# Patient Record
Sex: Male | Born: 1945
Health system: Southern US, Community
[De-identification: ages and names within clinical notes are randomized; demographics above are authoritative.]

## PROBLEM LIST (undated history)

## (undated) DIAGNOSIS — Z8701 Personal history of pneumonia (recurrent): Secondary | ICD-10-CM

## (undated) DIAGNOSIS — J349 Unspecified disorder of nose and nasal sinuses: Secondary | ICD-10-CM

## (undated) DIAGNOSIS — T4145XA Adverse effect of unspecified anesthetic, initial encounter: Secondary | ICD-10-CM

## (undated) DIAGNOSIS — E7800 Pure hypercholesterolemia, unspecified: Secondary | ICD-10-CM

## (undated) DIAGNOSIS — G4730 Sleep apnea, unspecified: Secondary | ICD-10-CM

## (undated) DIAGNOSIS — Z77098 Contact with and (suspected) exposure to other hazardous, chiefly nonmedicinal, chemicals: Secondary | ICD-10-CM

## (undated) DIAGNOSIS — K8020 Calculus of gallbladder without cholecystitis without obstruction: Secondary | ICD-10-CM

## (undated) DIAGNOSIS — I10 Essential (primary) hypertension: Secondary | ICD-10-CM

## (undated) DIAGNOSIS — Z923 Personal history of irradiation: Secondary | ICD-10-CM

## (undated) DIAGNOSIS — R519 Headache, unspecified: Secondary | ICD-10-CM

## (undated) DIAGNOSIS — C801 Malignant (primary) neoplasm, unspecified: Secondary | ICD-10-CM

## (undated) DIAGNOSIS — E039 Hypothyroidism, unspecified: Secondary | ICD-10-CM

## (undated) HISTORY — DX: Personal history of pneumonia (recurrent): Z87.01

## (undated) HISTORY — DX: Sleep apnea, unspecified: G47.30

## (undated) HISTORY — DX: Calculus of gallbladder without cholecystitis without obstruction: K80.20

## (undated) HISTORY — DX: Pure hypercholesterolemia, unspecified: E78.00

## (undated) HISTORY — DX: Unspecified disorder of nose and nasal sinuses: J34.9

---
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
## 1985-04-04 HISTORY — PX: NASAL SINUS SURGERY: SHX719

## 1998-09-05 ENCOUNTER — Emergency Department (HOSPITAL_COMMUNITY): Admission: EM | Admit: 1998-09-05 | Discharge: 1998-09-05 | Payer: Self-pay | Admitting: Emergency Medicine

## 2001-11-08 ENCOUNTER — Ambulatory Visit (HOSPITAL_COMMUNITY): Admission: RE | Admit: 2001-11-08 | Discharge: 2001-11-08 | Payer: Self-pay | Admitting: Interventional Cardiology

## 2002-05-14 ENCOUNTER — Encounter (INDEPENDENT_AMBULATORY_CARE_PROVIDER_SITE_OTHER): Payer: Self-pay | Admitting: Specialist

## 2002-05-14 ENCOUNTER — Ambulatory Visit (HOSPITAL_COMMUNITY): Admission: RE | Admit: 2002-05-14 | Discharge: 2002-05-14 | Payer: Self-pay | Admitting: Gastroenterology

## 2003-01-14 ENCOUNTER — Encounter: Admission: RE | Admit: 2003-01-14 | Discharge: 2003-01-14 | Payer: Self-pay | Admitting: Gastroenterology

## 2003-01-14 ENCOUNTER — Encounter: Payer: Self-pay | Admitting: Gastroenterology

## 2005-04-04 DIAGNOSIS — T8859XA Other complications of anesthesia, initial encounter: Secondary | ICD-10-CM

## 2005-04-04 HISTORY — DX: Other complications of anesthesia, initial encounter: T88.59XA

## 2005-12-01 ENCOUNTER — Ambulatory Visit (HOSPITAL_COMMUNITY): Admission: RE | Admit: 2005-12-01 | Discharge: 2005-12-01 | Payer: Self-pay | Admitting: Orthopedic Surgery

## 2005-12-16 ENCOUNTER — Ambulatory Visit: Payer: Self-pay | Admitting: Critical Care Medicine

## 2005-12-21 ENCOUNTER — Ambulatory Visit (HOSPITAL_COMMUNITY): Admission: RE | Admit: 2005-12-21 | Discharge: 2005-12-21 | Payer: Self-pay | Admitting: Critical Care Medicine

## 2005-12-21 ENCOUNTER — Encounter (INDEPENDENT_AMBULATORY_CARE_PROVIDER_SITE_OTHER): Payer: Self-pay | Admitting: *Deleted

## 2005-12-21 ENCOUNTER — Ambulatory Visit: Payer: Self-pay | Admitting: Critical Care Medicine

## 2005-12-21 HISTORY — PX: OTHER SURGICAL HISTORY: SHX169

## 2005-12-27 ENCOUNTER — Ambulatory Visit (HOSPITAL_COMMUNITY): Admission: RE | Admit: 2005-12-27 | Discharge: 2005-12-27 | Payer: Self-pay | Admitting: Critical Care Medicine

## 2005-12-28 ENCOUNTER — Ambulatory Visit: Admission: RE | Admit: 2005-12-28 | Discharge: 2005-12-28 | Payer: Self-pay | Admitting: Critical Care Medicine

## 2005-12-29 ENCOUNTER — Ambulatory Visit: Payer: Self-pay | Admitting: Critical Care Medicine

## 2006-01-23 ENCOUNTER — Inpatient Hospital Stay (HOSPITAL_COMMUNITY)
Admission: RE | Admit: 2006-01-23 | Discharge: 2006-01-28 | Payer: Self-pay | Admitting: Thoracic Surgery (Cardiothoracic Vascular Surgery)

## 2006-01-23 ENCOUNTER — Encounter (INDEPENDENT_AMBULATORY_CARE_PROVIDER_SITE_OTHER): Payer: Self-pay | Admitting: *Deleted

## 2006-01-23 HISTORY — PX: OTHER SURGICAL HISTORY: SHX169

## 2006-01-26 ENCOUNTER — Ambulatory Visit: Payer: Self-pay | Admitting: Internal Medicine

## 2006-02-09 ENCOUNTER — Encounter
Admission: RE | Admit: 2006-02-09 | Discharge: 2006-02-09 | Payer: Self-pay | Admitting: Thoracic Surgery (Cardiothoracic Vascular Surgery)

## 2006-02-27 LAB — COMPREHENSIVE METABOLIC PANEL
ALT: 22 U/L (ref 0–53)
Albumin: 4.4 g/dL (ref 3.5–5.2)
CO2: 29 mEq/L (ref 19–32)
Calcium: 9.6 mg/dL (ref 8.4–10.5)
Chloride: 104 mEq/L (ref 96–112)
Glucose, Bld: 76 mg/dL (ref 70–99)
Potassium: 3.7 mEq/L (ref 3.5–5.3)
Sodium: 141 mEq/L (ref 135–145)
Total Bilirubin: 0.4 mg/dL (ref 0.3–1.2)
Total Protein: 6.4 g/dL (ref 6.0–8.3)

## 2006-02-27 LAB — CBC WITH DIFFERENTIAL/PLATELET
BASO%: 0.3 % (ref 0.0–2.0)
Eosinophils Absolute: 0.4 10*3/uL (ref 0.0–0.5)
LYMPH%: 37.2 % (ref 14.0–48.0)
MCHC: 34.9 g/dL (ref 32.0–35.9)
MONO#: 0.5 10*3/uL (ref 0.1–0.9)
NEUT#: 2.9 10*3/uL (ref 1.5–6.5)
Platelets: 246 10*3/uL (ref 145–400)
RBC: 4 10*6/uL — ABNORMAL LOW (ref 4.20–5.71)
WBC: 6.2 10*3/uL (ref 4.0–10.0)
lymph#: 2.3 10*3/uL (ref 0.9–3.3)

## 2006-03-05 ENCOUNTER — Encounter: Admission: RE | Admit: 2006-03-05 | Discharge: 2006-03-05 | Payer: Self-pay | Admitting: Internal Medicine

## 2006-03-08 ENCOUNTER — Ambulatory Visit (HOSPITAL_COMMUNITY): Admission: RE | Admit: 2006-03-08 | Discharge: 2006-03-08 | Payer: Self-pay | Admitting: Internal Medicine

## 2006-04-19 ENCOUNTER — Encounter
Admission: RE | Admit: 2006-04-19 | Discharge: 2006-04-19 | Payer: Self-pay | Admitting: Thoracic Surgery (Cardiothoracic Vascular Surgery)

## 2006-06-06 ENCOUNTER — Ambulatory Visit: Payer: Self-pay | Admitting: Thoracic Surgery (Cardiothoracic Vascular Surgery)

## 2006-06-08 ENCOUNTER — Encounter
Admission: RE | Admit: 2006-06-08 | Discharge: 2006-06-08 | Payer: Self-pay | Admitting: Thoracic Surgery (Cardiothoracic Vascular Surgery)

## 2006-08-18 ENCOUNTER — Ambulatory Visit: Payer: Self-pay | Admitting: Internal Medicine

## 2006-08-22 LAB — CBC WITH DIFFERENTIAL/PLATELET
BASO%: 0.3 % (ref 0.0–2.0)
Eosinophils Absolute: 0.2 10*3/uL (ref 0.0–0.5)
MCHC: 34.8 g/dL (ref 32.0–35.9)
MONO#: 0.6 10*3/uL (ref 0.1–0.9)
NEUT#: 2.3 10*3/uL (ref 1.5–6.5)
RBC: 4.33 10*6/uL (ref 4.20–5.71)
RDW: 13.6 % (ref 11.2–14.6)
WBC: 5.2 10*3/uL (ref 4.0–10.0)
lymph#: 2.2 10*3/uL (ref 0.9–3.3)

## 2006-08-22 LAB — COMPREHENSIVE METABOLIC PANEL
ALT: 50 U/L (ref 0–53)
Albumin: 4.3 g/dL (ref 3.5–5.2)
CO2: 28 mEq/L (ref 19–32)
Glucose, Bld: 104 mg/dL — ABNORMAL HIGH (ref 70–99)
Potassium: 3.7 mEq/L (ref 3.5–5.3)
Sodium: 145 mEq/L (ref 135–145)
Total Bilirubin: 0.4 mg/dL (ref 0.3–1.2)
Total Protein: 6.6 g/dL (ref 6.0–8.3)

## 2006-08-24 ENCOUNTER — Ambulatory Visit (HOSPITAL_COMMUNITY): Admission: RE | Admit: 2006-08-24 | Discharge: 2006-08-24 | Payer: Self-pay | Admitting: Internal Medicine

## 2006-10-17 ENCOUNTER — Ambulatory Visit: Payer: Self-pay | Admitting: Thoracic Surgery (Cardiothoracic Vascular Surgery)

## 2006-10-17 ENCOUNTER — Encounter
Admission: RE | Admit: 2006-10-17 | Discharge: 2006-10-17 | Payer: Self-pay | Admitting: Thoracic Surgery (Cardiothoracic Vascular Surgery)

## 2006-11-06 ENCOUNTER — Ambulatory Visit (HOSPITAL_COMMUNITY): Admission: RE | Admit: 2006-11-06 | Discharge: 2006-11-06 | Payer: Self-pay | Admitting: Orthopedic Surgery

## 2006-11-06 HISTORY — PX: OTHER SURGICAL HISTORY: SHX169

## 2006-11-14 ENCOUNTER — Encounter: Admission: RE | Admit: 2006-11-14 | Discharge: 2006-11-14 | Payer: Self-pay | Admitting: Orthopedic Surgery

## 2007-01-22 ENCOUNTER — Ambulatory Visit: Payer: Self-pay | Admitting: Thoracic Surgery (Cardiothoracic Vascular Surgery)

## 2007-01-22 ENCOUNTER — Encounter
Admission: RE | Admit: 2007-01-22 | Discharge: 2007-01-22 | Payer: Self-pay | Admitting: Thoracic Surgery (Cardiothoracic Vascular Surgery)

## 2007-06-18 ENCOUNTER — Encounter
Admission: RE | Admit: 2007-06-18 | Discharge: 2007-06-18 | Payer: Self-pay | Admitting: Thoracic Surgery (Cardiothoracic Vascular Surgery)

## 2007-06-18 ENCOUNTER — Ambulatory Visit: Payer: Self-pay | Admitting: Thoracic Surgery (Cardiothoracic Vascular Surgery)

## 2007-07-11 ENCOUNTER — Ambulatory Visit: Payer: Self-pay | Admitting: Internal Medicine

## 2007-07-16 ENCOUNTER — Ambulatory Visit (HOSPITAL_COMMUNITY): Admission: RE | Admit: 2007-07-16 | Discharge: 2007-07-16 | Payer: Self-pay | Admitting: Internal Medicine

## 2007-07-16 LAB — CBC WITH DIFFERENTIAL/PLATELET
Eosinophils Absolute: 0.2 10*3/uL (ref 0.0–0.5)
HCT: 39.8 % (ref 38.7–49.9)
LYMPH%: 43.3 % (ref 14.0–48.0)
MONO#: 0.5 10*3/uL (ref 0.1–0.9)
NEUT#: 3 10*3/uL (ref 1.5–6.5)
NEUT%: 45.7 % (ref 40.0–75.0)
Platelets: 236 10*3/uL (ref 145–400)
WBC: 6.6 10*3/uL (ref 4.0–10.0)
lymph#: 2.9 10*3/uL (ref 0.9–3.3)

## 2007-07-16 LAB — COMPREHENSIVE METABOLIC PANEL
ALT: 34 U/L (ref 0–53)
CO2: 29 mEq/L (ref 19–32)
Calcium: 9.5 mg/dL (ref 8.4–10.5)
Chloride: 104 mEq/L (ref 96–112)
Creatinine, Ser: 1.05 mg/dL (ref 0.40–1.50)
Glucose, Bld: 101 mg/dL — ABNORMAL HIGH (ref 70–99)
Sodium: 139 mEq/L (ref 135–145)
Total Bilirubin: 0.7 mg/dL (ref 0.3–1.2)
Total Protein: 6.5 g/dL (ref 6.0–8.3)

## 2007-07-19 ENCOUNTER — Encounter: Payer: Self-pay | Admitting: Critical Care Medicine

## 2007-09-28 ENCOUNTER — Ambulatory Visit: Payer: Self-pay | Admitting: Thoracic Surgery (Cardiothoracic Vascular Surgery)

## 2007-09-28 ENCOUNTER — Encounter
Admission: RE | Admit: 2007-09-28 | Discharge: 2007-09-28 | Payer: Self-pay | Admitting: Thoracic Surgery (Cardiothoracic Vascular Surgery)

## 2008-01-08 ENCOUNTER — Ambulatory Visit: Payer: Self-pay | Admitting: Internal Medicine

## 2008-01-10 ENCOUNTER — Ambulatory Visit (HOSPITAL_COMMUNITY): Admission: RE | Admit: 2008-01-10 | Discharge: 2008-01-10 | Payer: Self-pay | Admitting: Internal Medicine

## 2008-01-10 LAB — CBC WITH DIFFERENTIAL/PLATELET
Basophils Absolute: 0 10*3/uL (ref 0.0–0.1)
Eosinophils Absolute: 0.1 10*3/uL (ref 0.0–0.5)
HCT: 39.4 % (ref 38.7–49.9)
HGB: 13.7 g/dL (ref 13.0–17.1)
LYMPH%: 37.8 % (ref 14.0–48.0)
MCHC: 34.7 g/dL (ref 32.0–35.9)
MONO#: 0.5 10*3/uL (ref 0.1–0.9)
NEUT%: 51 % (ref 40.0–75.0)
Platelets: 213 10*3/uL (ref 145–400)
WBC: 5.8 10*3/uL (ref 4.0–10.0)

## 2008-01-10 LAB — COMPREHENSIVE METABOLIC PANEL
BUN: 13 mg/dL (ref 6–23)
CO2: 29 mEq/L (ref 19–32)
Calcium: 9.1 mg/dL (ref 8.4–10.5)
Creatinine, Ser: 0.9 mg/dL (ref 0.40–1.50)
Glucose, Bld: 104 mg/dL — ABNORMAL HIGH (ref 70–99)
Total Bilirubin: 1 mg/dL (ref 0.3–1.2)

## 2008-01-16 ENCOUNTER — Encounter: Payer: Self-pay | Admitting: Critical Care Medicine

## 2008-01-31 ENCOUNTER — Ambulatory Visit: Payer: Self-pay | Admitting: Thoracic Surgery (Cardiothoracic Vascular Surgery)

## 2008-07-09 ENCOUNTER — Ambulatory Visit: Payer: Self-pay | Admitting: Internal Medicine

## 2008-07-11 ENCOUNTER — Ambulatory Visit (HOSPITAL_COMMUNITY): Admission: RE | Admit: 2008-07-11 | Discharge: 2008-07-11 | Payer: Self-pay | Admitting: Internal Medicine

## 2008-07-11 LAB — CBC WITH DIFFERENTIAL/PLATELET
Basophils Absolute: 0 10*3/uL (ref 0.0–0.1)
Eosinophils Absolute: 0.1 10*3/uL (ref 0.0–0.5)
HGB: 14.4 g/dL (ref 13.0–17.1)
MCV: 97.7 fL (ref 79.3–98.0)
MONO#: 0.6 10*3/uL (ref 0.1–0.9)
MONO%: 9.8 % (ref 0.0–14.0)
NEUT#: 3.1 10*3/uL (ref 1.5–6.5)
RBC: 4.21 10*6/uL (ref 4.20–5.82)
RDW: 13.3 % (ref 11.0–14.6)
WBC: 6.5 10*3/uL (ref 4.0–10.3)

## 2008-07-11 LAB — COMPREHENSIVE METABOLIC PANEL
Albumin: 4.2 g/dL (ref 3.5–5.2)
BUN: 13 mg/dL (ref 6–23)
CO2: 29 mEq/L (ref 19–32)
Calcium: 9.2 mg/dL (ref 8.4–10.5)
Chloride: 102 mEq/L (ref 96–112)
Glucose, Bld: 102 mg/dL — ABNORMAL HIGH (ref 70–99)
Potassium: 3.5 mEq/L (ref 3.5–5.3)
Sodium: 137 mEq/L (ref 135–145)
Total Protein: 6.6 g/dL (ref 6.0–8.3)

## 2008-07-15 ENCOUNTER — Encounter: Payer: Self-pay | Admitting: Critical Care Medicine

## 2008-12-12 ENCOUNTER — Observation Stay (HOSPITAL_COMMUNITY): Admission: AD | Admit: 2008-12-12 | Discharge: 2008-12-13 | Payer: Self-pay | Admitting: Cardiology

## 2009-01-08 ENCOUNTER — Ambulatory Visit: Payer: Self-pay | Admitting: Internal Medicine

## 2009-01-12 ENCOUNTER — Ambulatory Visit (HOSPITAL_COMMUNITY): Admission: RE | Admit: 2009-01-12 | Discharge: 2009-01-12 | Payer: Self-pay | Admitting: Internal Medicine

## 2009-01-12 LAB — COMPREHENSIVE METABOLIC PANEL
Albumin: 4.1 g/dL (ref 3.5–5.2)
Alkaline Phosphatase: 55 U/L (ref 39–117)
BUN: 11 mg/dL (ref 6–23)
CO2: 30 mEq/L (ref 19–32)
Glucose, Bld: 94 mg/dL (ref 70–99)
Potassium: 3.4 mEq/L — ABNORMAL LOW (ref 3.5–5.3)
Total Bilirubin: 0.6 mg/dL (ref 0.3–1.2)
Total Protein: 6.5 g/dL (ref 6.0–8.3)

## 2009-01-12 LAB — CBC WITH DIFFERENTIAL/PLATELET
Basophils Absolute: 0 10*3/uL (ref 0.0–0.1)
Eosinophils Absolute: 0.1 10*3/uL (ref 0.0–0.5)
HGB: 13.9 g/dL (ref 13.0–17.1)
LYMPH%: 38.8 % (ref 14.0–49.0)
MCV: 97.1 fL (ref 79.3–98.0)
MONO#: 0.6 10*3/uL (ref 0.1–0.9)
MONO%: 9.9 % (ref 0.0–14.0)
NEUT#: 3 10*3/uL (ref 1.5–6.5)
Platelets: 195 10*3/uL (ref 140–400)
WBC: 6.1 10*3/uL (ref 4.0–10.3)

## 2009-01-14 ENCOUNTER — Encounter: Payer: Self-pay | Admitting: Critical Care Medicine

## 2009-02-10 ENCOUNTER — Ambulatory Visit: Payer: Self-pay | Admitting: Thoracic Surgery (Cardiothoracic Vascular Surgery)

## 2009-07-06 ENCOUNTER — Ambulatory Visit: Payer: Self-pay | Admitting: Internal Medicine

## 2009-07-08 ENCOUNTER — Ambulatory Visit (HOSPITAL_COMMUNITY): Admission: RE | Admit: 2009-07-08 | Discharge: 2009-07-08 | Payer: Self-pay | Admitting: Internal Medicine

## 2009-07-08 LAB — CBC WITH DIFFERENTIAL/PLATELET
Basophils Absolute: 0 10*3/uL (ref 0.0–0.1)
EOS%: 2.3 % (ref 0.0–7.0)
Eosinophils Absolute: 0.1 10*3/uL (ref 0.0–0.5)
HCT: 40.5 % (ref 38.4–49.9)
HGB: 14.2 g/dL (ref 13.0–17.1)
LYMPH%: 38.8 % (ref 14.0–49.0)
MCH: 34.8 pg — ABNORMAL HIGH (ref 27.2–33.4)
MCV: 99.1 fL — ABNORMAL HIGH (ref 79.3–98.0)
MONO%: 8.9 % (ref 0.0–14.0)
NEUT#: 2.8 10*3/uL (ref 1.5–6.5)
NEUT%: 49.8 % (ref 39.0–75.0)
Platelets: 222 10*3/uL (ref 140–400)

## 2009-07-08 LAB — COMPREHENSIVE METABOLIC PANEL
AST: 19 U/L (ref 0–37)
Albumin: 4.2 g/dL (ref 3.5–5.2)
Alkaline Phosphatase: 46 U/L (ref 39–117)
BUN: 10 mg/dL (ref 6–23)
Creatinine, Ser: 1.05 mg/dL (ref 0.40–1.50)
Glucose, Bld: 102 mg/dL — ABNORMAL HIGH (ref 70–99)
Potassium: 3.8 mEq/L (ref 3.5–5.3)
Total Bilirubin: 0.7 mg/dL (ref 0.3–1.2)

## 2010-01-07 ENCOUNTER — Ambulatory Visit: Payer: Self-pay | Admitting: Internal Medicine

## 2010-01-11 ENCOUNTER — Ambulatory Visit (HOSPITAL_COMMUNITY): Admission: RE | Admit: 2010-01-11 | Discharge: 2010-01-11 | Payer: Self-pay | Admitting: Internal Medicine

## 2010-01-11 LAB — CBC WITH DIFFERENTIAL/PLATELET
Basophils Absolute: 0 10*3/uL (ref 0.0–0.1)
EOS%: 2.7 % (ref 0.0–7.0)
Eosinophils Absolute: 0.2 10*3/uL (ref 0.0–0.5)
HGB: 13.3 g/dL (ref 13.0–17.1)
LYMPH%: 45.1 % (ref 14.0–49.0)
MCH: 34.5 pg — ABNORMAL HIGH (ref 27.2–33.4)
MCV: 98.4 fL — ABNORMAL HIGH (ref 79.3–98.0)
MONO%: 10.1 % (ref 0.0–14.0)
Platelets: 234 10*3/uL (ref 140–400)
RBC: 3.85 10*6/uL — ABNORMAL LOW (ref 4.20–5.82)
RDW: 13.1 % (ref 11.0–14.6)

## 2010-01-11 LAB — COMPREHENSIVE METABOLIC PANEL
AST: 27 U/L (ref 0–37)
Albumin: 4 g/dL (ref 3.5–5.2)
Alkaline Phosphatase: 50 U/L (ref 39–117)
BUN: 16 mg/dL (ref 6–23)
Glucose, Bld: 100 mg/dL — ABNORMAL HIGH (ref 70–99)
Potassium: 3.8 mEq/L (ref 3.5–5.3)
Total Bilirubin: 0.8 mg/dL (ref 0.3–1.2)

## 2010-02-08 ENCOUNTER — Ambulatory Visit: Payer: Self-pay | Admitting: Thoracic Surgery (Cardiothoracic Vascular Surgery)

## 2010-04-16 ENCOUNTER — Ambulatory Visit
Admission: RE | Admit: 2010-04-16 | Discharge: 2010-04-17 | Payer: Self-pay | Source: Home / Self Care | Attending: Urology | Admitting: Urology

## 2010-04-16 HISTORY — PX: OTHER SURGICAL HISTORY: SHX169

## 2010-04-19 LAB — POCT I-STAT 4, (NA,K, GLUC, HGB,HCT)
Glucose, Bld: 108 mg/dL — ABNORMAL HIGH (ref 70–99)
HCT: 39 % (ref 39.0–52.0)
Hemoglobin: 13.3 g/dL (ref 13.0–17.0)
Potassium: 3.5 mEq/L (ref 3.5–5.1)
Sodium: 141 mEq/L (ref 135–145)

## 2010-04-23 ENCOUNTER — Other Ambulatory Visit: Payer: Self-pay | Admitting: Internal Medicine

## 2010-04-23 DIAGNOSIS — C3490 Malignant neoplasm of unspecified part of unspecified bronchus or lung: Secondary | ICD-10-CM

## 2010-04-25 ENCOUNTER — Encounter: Payer: Self-pay | Admitting: Internal Medicine

## 2010-04-27 NOTE — Op Note (Signed)
  [name], [name]               ACCOUNT NO.:  [id number]  MEDICAL RECORD NO.:  [id number]          PATIENT TYPE:  AMB  LOCATION:  [facility]                         FACILITY:  [facility]  PHYSICIAN:  Wendel Homeyer I. Patsi Sears, M.D.DATE OF BIRTH:  01-30-1946  DATE OF PROCEDURE: DATE OF DISCHARGE:                              OPERATIVE REPORT   PREOPERATIVE DIAGNOSIS:  Benign prostatic hypertrophy.  POSTOPERATIVE DIAGNOSIS:  Benign prostatic hypertrophy.  OPERATION:  Cystourethroscopy, [facility] TURP.  SURGEON:  Dawna Jakes I. Patsi Sears, M.D.  ANESTHESIA:  General LMA.  PREPARATION:  After appropriate preanesthesia, the patient was brought to the operating room, placed on the operating table in dorsal supine position where general LMA anesthesia was introduced.  He was then replaced in dorsal lithotomy position where the pubis was prepped with Betadine solution and draped in usual fashion.  REVIEW OF HISTORY:  The patient is a 65 year old male with a history of BPH and an IPSS score of 5 in the past, but rise in his lower urinary tract symptoms to an IPSS score of 23 with the quality of life of 4/6. Urodynamics shows stable bladder, with pressure flow showing a maximum flow of 10 mL/second with a detrusor pressure of 45 cm of water.  He was felt to have a large hyposensitive detrusor, is now for TURP.  DETAILS OF PROCEDURE:  Cystourethroscopy was accomplished and shows bilobar BPH.  There was no evidence of large median lobe.  Resection was accomplished from the 7 o'clock to the 5 o'clock position, then from the 11 o'clock to the 6 o'clock position, and from the 1 o'clock to the 6 o'clock position.  Bleeding was electrocoagulated.  Chips were evacuated free from the bladder and sent to laboratory.  The patient was then awakened and taken to recovery room in good condition.  He had Foley catheter placed, B and O suppository placed as well.  He will be observed for 24 hours.     [name] Sagona I.  Patsi Sears, M.D.     SIT/MEDQ  D:  04/16/2010  T:  04/16/2010  Job:  [id number]  cc:   Candyce Churn, M.D. Fax: [phone]  Electronically Signed by Jethro Bolus M.D. on 04/27/2010 01:43:43 PM

## 2010-05-31 ENCOUNTER — Emergency Department (HOSPITAL_COMMUNITY): Payer: BC Managed Care – PPO

## 2010-05-31 ENCOUNTER — Observation Stay (HOSPITAL_COMMUNITY)
Admission: EM | Admit: 2010-05-31 | Discharge: 2010-06-01 | Disposition: A | Discharge status: Home / Self Care | Payer: BC Managed Care – PPO | Attending: Family Medicine | Admitting: Family Medicine

## 2010-05-31 ENCOUNTER — Encounter (HOSPITAL_COMMUNITY): Payer: Self-pay | Admitting: Radiology

## 2010-05-31 DIAGNOSIS — E876 Hypokalemia: Secondary | ICD-10-CM | POA: Insufficient documentation

## 2010-05-31 DIAGNOSIS — G8929 Other chronic pain: Secondary | ICD-10-CM | POA: Insufficient documentation

## 2010-05-31 DIAGNOSIS — R079 Chest pain, unspecified: Principal | ICD-10-CM | POA: Insufficient documentation

## 2010-05-31 DIAGNOSIS — M545 Low back pain, unspecified: Secondary | ICD-10-CM | POA: Insufficient documentation

## 2010-05-31 DIAGNOSIS — K219 Gastro-esophageal reflux disease without esophagitis: Secondary | ICD-10-CM | POA: Insufficient documentation

## 2010-05-31 DIAGNOSIS — E785 Hyperlipidemia, unspecified: Secondary | ICD-10-CM | POA: Insufficient documentation

## 2010-05-31 DIAGNOSIS — Z85118 Personal history of other malignant neoplasm of bronchus and lung: Secondary | ICD-10-CM | POA: Insufficient documentation

## 2010-05-31 DIAGNOSIS — G43909 Migraine, unspecified, not intractable, without status migrainosus: Secondary | ICD-10-CM | POA: Insufficient documentation

## 2010-05-31 DIAGNOSIS — I10 Essential (primary) hypertension: Secondary | ICD-10-CM | POA: Insufficient documentation

## 2010-05-31 HISTORY — DX: Essential (primary) hypertension: I10

## 2010-05-31 LAB — DIFFERENTIAL
Basophils Absolute: 0.1 10*3/uL (ref 0.0–0.1)
Lymphocytes Relative: 38 % (ref 12–46)
Lymphs Abs: 2.4 10*3/uL (ref 0.7–4.0)
Monocytes Absolute: 0.7 10*3/uL (ref 0.1–1.0)
Neutro Abs: 2.9 10*3/uL (ref 1.7–7.7)

## 2010-05-31 LAB — CBC
HCT: 37.7 % — ABNORMAL LOW (ref 39.0–52.0)
Hemoglobin: 13.2 g/dL (ref 13.0–17.0)
MCV: 95 fL (ref 78.0–100.0)
WBC: 6.2 10*3/uL (ref 4.0–10.5)

## 2010-05-31 LAB — COMPREHENSIVE METABOLIC PANEL
Alkaline Phosphatase: 49 U/L (ref 39–117)
BUN: 11 mg/dL (ref 6–23)
CO2: 28 mEq/L (ref 19–32)
Chloride: 101 mEq/L (ref 96–112)
GFR calc non Af Amer: >60 mL/min (ref >60)
Glucose, Bld: 104 mg/dL — ABNORMAL HIGH (ref 70–99)
Potassium: 3.3 mEq/L — ABNORMAL LOW (ref 3.5–5.1)
Total Bilirubin: 0.6 mg/dL (ref 0.3–1.2)

## 2010-05-31 LAB — POCT CARDIAC MARKERS
CKMB, poc: 1.7 ng/mL (ref 1.0–8.0)
Myoglobin, poc: 85.2 ng/mL (ref 12–200)

## 2010-05-31 LAB — POCT I-STAT, CHEM 8
BUN: 12 mg/dL (ref 6–23)
Chloride: 98 mEq/L (ref 96–112)
Glucose, Bld: 101 mg/dL — ABNORMAL HIGH (ref 70–99)
HCT: 39 % (ref 39.0–52.0)
Potassium: 3.2 mEq/L — ABNORMAL LOW (ref 3.5–5.1)

## 2010-05-31 LAB — PROTIME-INR: INR: 1.06 (ref 0.00–1.49)

## 2010-05-31 LAB — APTT: aPTT: 32 seconds (ref 24–37)

## 2010-05-31 MED ORDER — IOHEXOL 300 MG/ML  SOLN
100.0000 mL | Freq: Once | INTRAMUSCULAR | Status: AC | PRN
  Administered 2010-05-31: 100 mL via INTRAVENOUS

## 2010-06-01 LAB — CBC
Hemoglobin: 15 g/dL (ref 13.0–17.0)
MCH: 33.7 pg (ref 26.0–34.0)
MCV: 94.8 fL (ref 78.0–100.0)
Platelets: 208 10*3/uL (ref 150–400)
RBC: 4.45 MIL/uL (ref 4.22–5.81)

## 2010-06-01 LAB — DIFFERENTIAL
Eosinophils Absolute: 0 10*3/uL (ref 0.0–0.7)
Lymphs Abs: 2 10*3/uL (ref 0.7–4.0)
Monocytes Relative: 7 % (ref 3–12)
Neutrophils Relative %: 71 % (ref 43–77)

## 2010-06-01 LAB — CARDIAC PANEL(CRET KIN+CKTOT+MB+TROPI)
CK, MB: 2.3 ng/mL (ref 0.3–4.0)
Total CK: 161 U/L (ref 7–232)
Troponin I: <0.01 ng/mL (ref 0.00–0.06)

## 2010-06-01 LAB — COMPREHENSIVE METABOLIC PANEL
BUN: 8 mg/dL (ref 6–23)
CO2: 26 mEq/L (ref 19–32)
Chloride: 101 mEq/L (ref 96–112)
Creatinine, Ser: 0.88 mg/dL (ref 0.4–1.5)
GFR calc non Af Amer: >60 mL/min (ref >60)
Total Bilirubin: 0.5 mg/dL (ref 0.3–1.2)

## 2010-06-01 NOTE — H&P (Signed)
NAMEBENFORD, [name] NO.:  [id number]  MEDICAL RECORD NO.:  [id number]           PATIENT TYPE:  LOCATION:                                 FACILITY:  PHYSICIAN:  Talmage Nap, MD  DATE OF BIRTH:  08-Apr-1945  DATE OF ADMISSION:  06/01/2010 DATE OF DISCHARGE:                             HISTORY & PHYSICAL   PRIMARY CARE PHYSICIAN:  Unassigned.  CARDIOLOGIST:  Lyn Records, MD  History obtainable from the patient and the patient's spouse.  CHIEF COMPLAINT:  Retrosternal pain of 1 days' duration.  The patient is a 65 year old Caucasian male with history of right lung CA status post lobectomy and hypertension as well as GERD presenting to the emergency room with chest pain of 1 days' duration which according to the patient started suddenly.  He described the pain as burning in nature located in the epigastric as well as in the retrosternal region about 6/10 in intensity and was nonradiating.  The patient denied any associated shortness of breath.  He denied any nausea.  He denied any vomiting.  He denied any fever.  He denied any chills or rigor.  He claimed he had numbness on the left arm.  He denied any history of PND, orthopnea.  The discomfort was said to have persisted and hence the patient presented to the emergency room to be evaluated.  PAST MEDICAL HISTORY: 1. Positive for hypertension. 2. Hyperlipidemia. 3. Adenocarcinoma of the right upper lung. 4. Diverticulosis. 5. Migraine headaches. 6. GERD.  PAST SURGICAL HISTORY: 1. Adenocarcinoma of the right lung status post right lobectomy. 2. Lower back surgery. 3. TURP for BPH.  PREADMISSION MEDICATIONS WITHOUT DOSES: 1. Amitriptyline. 2. Aspirin. 3. Cafergot. 4. Centravite. 5. Centrum. 6. Crestor. 7. Diltiazem. 8. Diovan. 9. Keppra. 10.Metamucil. 11.Naprosyn. 12.Reglan.  ALLERGIES:  He has no known allergies.  SOCIAL HISTORY:  Negative for alcohol or tobacco use.  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  FAMILY HISTORY:  Positive for coronary artery disease.  REVIEW OF SYSTEMS:  The patient complained of migrainous headache made worse by light.  He denied any associated nausea or vomiting.  No fever. No chills.  No rigor.  He denied any shortness of breath.  Complained of mild epigastric discomfort that is mild in nature with no radiation.  No frank chest pain.  No cough.  No PND or orthopnea.  No diarrhea or hematochezia.  No dysuria or hematuria.  No swelling of the lower extremities.  No intolerance to heat or cold and no neuropsychiatric disorder.  PHYSICAL EXAMINATION:  GENERAL:  A very pleasant man complaining about headache, made worse by light but not in any respiratory distress. PRESENT VITAL SIGNS:  Blood pressure 162/93, pulse is 91, respiratory rate is 16, temperature is 97.9. HEENT:  Pupils are reactive to light and extraocular muscles are intact. NECK:  No jugular venous distention.  No carotid bruit.  No lymphadenopathy. CHEST:  Clear to auscultation. CARDIAC:  Heart sounds are 1 and 2. ABDOMEN:  Soft with mild epigastric tenderness.  Liver, spleen, and kidney not palpable.  Bowel sounds are positive. EXTREMITIES:  No pedal edema. NEUROLOGIC:  Nonfocal. MUSCULOSKELETAL SYSTEM:  Shows arthritic changes, especially in the knees. NEUROPSYCHIATRIC:  Evaluation is unremarkable. SKIN:  Showed normal turgor.  LABORATORY DATA:  Initial BNP less than 30.  Cardiac markers, troponin-I 0.01.  Chemistry showed sodium of 136, potassium of 3.3, chloride of 101 with a bicarb of 28, glucose is 104, BUN is 11, creatinine 0.88.  LFT normal.  Coagulation profile showed PT 14.0, PTT 32, INR 1.06. Hematological indices showed WBC of 6.2, hemoglobin 13.2, hematocrit 37.7, MCV of 95.0 with a platelet count of 196,000.  EKG showed normal sinus rhythm with a rate of 64.  No acute ST-wave change noted.  CT angiogram with  contrast did not show any evidence of pulmonary embolism. There is, however, stone in the gallbladder, but no evidence of cholecystitis.  Chest x-ray normal.  IMPRESSION: 1. Chest pain, rule out acute coronary syndrome, most likely secondary     to gerd. 2. Active migraine attack. 3. Hypokalemia. 4. History of lung carcinoma status post right lobectomy. 5. Hypertension. 6. Hyperlipidemia. 7. Gastroesophageal reflux disease. 8. Chronic low back pain.  PLAN:  To admit the patient to telemetry.  The patient will be given aspirin 325 mg p.o. daily, morphine 2 mg IV q.4 p.r.n.  He will also be given Percocet 5/325, 2 tablets p.o. q.4 p.r.n. and nitroglycerin 0.4 mg sublingual p.r.n. for chest pain, propranolol 40 mg p.o. b.i.d.  Other medication to be given to the patient include Diovan 80 mg p.o. daily and since the patient is found to be hypokalemic, he will also be given KCl 10 mEq p.o. b.i.d.  In addition, the patient will be on Mylanta 15 mL p.o. t.i.d. and Protonix 40 mg IV q.24 for GI prophylaxis and Lovenox 40 mg subcu q.24 for DVT prophylaxis.  Further labs to be ordered on this patient will include cardiac enzymes q.6 x3.  CBCD, CMP, and magnesium will be repeated in a.m.  Finally if cardiac enzymes are negative, the rounding physician may consider discharging the patient.     Talmage Nap, MD     CN/MEDQ  D:  06/01/2010  T:  06/01/2010  Job:  [id number]  Electronically Signed by Talmage Nap  on 06/01/2010 11:36:30 AM

## 2010-06-17 ENCOUNTER — Other Ambulatory Visit: Payer: Self-pay | Admitting: Internal Medicine

## 2010-06-17 DIAGNOSIS — C3490 Malignant neoplasm of unspecified part of unspecified bronchus or lung: Secondary | ICD-10-CM

## 2010-06-17 NOTE — Discharge Summary (Signed)
[name], [name]               ACCOUNT NO.:  [id number]  MEDICAL RECORD NO.:  [id number]           PATIENT TYPE:  I  LOCATION:  4703                         FACILITY:  [facility]  PHYSICIAN:  Pleas Koch, MD        DATE OF BIRTH:  1946/03/27  DATE OF ADMISSION:  05/31/2010 DATE OF DISCHARGE:  06/01/2010                              DISCHARGE SUMMARY   PRIMARY CARE PHYSICIAN:  Unassigned.  CARDIOLOGIST:  Lyn Records, MD  Please see full admission dictation by Dr. Beverly Gust.  DISCHARGE DIAGNOSES: 1. Chest pain likely noncardiac. 2. Migraine. 3. History of migraine. 4. History of hypokalemia. 5. History of lung carcinoma, status post right lobectomy. 6. Hypertension. 7. Hyperlipidemia. 8. Reflux. 9. Chronic low back pain.  DISCHARGE MEDICATIONS: 1. Amitriptyline 100 mg every evening. 2. Aspirin 81 mg by mouth every morning. 3. Cafergot 1/100 1 tablet daily as needed. 4. Crestor 5 mg by mouth every evening. 5. Diltiazem 360 mg 1 capsule by mouth every morning. 6. Diovan/HCTZ 1 tablet by mouth every morning. 7. Colace 2 capsules by mouth every evening. 8. Keppra 500 mg 1 tablet morning, 2 tablets in the evening by mouth     twice daily. 9. Metamucil 3 caps by mouth every evening. 10.Loperamide 10 mg for nausea and migraines 1 tablet by mouth daily     as needed. 11.Multivitamins 1 tablet by mouth every morning. 12.Naprosyn 550 mg 1 tablet by mouth daily as needed. 13.Nitroglycerin sublingual 0.4 mg for chest pain on tongue every 5     minutes as needed up to three doses. 14.Vitamin D3 over-the-counter 1 capsule every morning.  BRIEF HOSPITAL HISTORY:  This is a 65 year old male complained of 1 day history of chest pain, which started suddenly.  He described as burning nature.  Epigastric, retrosternal area 6/10 in intensity, nonradiating. Denied any shortness of breath, fever, chills, rigors and claimed he had numbness of the left arm.  Denied any history of  paroxysmal nocturnal dyspnea or orthopnea and discomfort and said to have persisted.  Hence, the patient presented to the emergency room for evaluation.  PHYSICAL EXAMINATION:  VITAL SIGNS:  Initially blood pressure 162/93, pulse was 91, respiratory rate was 16, temperature of 97.9. HEENT:  Pupils were equal and reactive to light. ABDOMEN:  He had some mild epigastric tenderness.  Liver, spleen and kidney were not palpable.  Bowel sounds were positive. NEUROPSYCHIATRIC:  Unremarkable.  LABORATORY DATA:  BNP is less than 30.  Cardiac markers were negative initially.  Hemoglobin 13.2, hematocrit 37.7, platelet count 186.  EKG showed normal sinus rate of 64.  No acute ST-T wave changes.  CT angiogram chest did not show any evidence of pulmonary embolism, but did show a stone in gallbladder with no evidence of cholecystitis.  HOSPITAL COURSE:  The patient was kept overnight and he had 3 sets of cardiac enzymes, which ruled out him as being negative.  The patient felt very good on day of discharge, had no further chest pain, states that the nitroglycerin actually via the trach.  He stated that the reason he came in was because he  had some numbness in his left arm at that time.  I certainly feel that this may likely be secondary to possible cardiac disease as he does have family member who has these issues.  His other risk factors will be hypertension and diabetes and will be mildly elevated hypertension.  I do not have a lipid panel to compare with.  The patient seems hemodynamically stable, however, as he is already established cardiologist, it was felt that the patient was stable to go home and have outpatient stress versus an echocardiogram at Dr. Michaelle Copas office.  The patient was agreeable to the same.  The patient was discharged home in stable state.  His vitals were 97.3, pulse 87, respirations 18, systolic blood pressure 157/89 and O2 sats were 97% on room air.  The patient  understood fully implications if he has recurring chest pain.          ______________________________ Pleas Koch, MD     JS/MEDQ  D:  06/01/2010  T:  06/01/2010  Job:  [id number]  Electronically Signed by Pleas Koch MD on 06/17/2010 04:38:07 PM

## 2010-06-25 ENCOUNTER — Encounter (HOSPITAL_COMMUNITY)
Admission: RE | Admit: 2010-06-25 | Discharge: 2010-06-25 | Disposition: A | Discharge status: Home / Self Care | Payer: BC Managed Care – PPO | Source: Ambulatory Visit | Attending: Internal Medicine | Admitting: Internal Medicine

## 2010-06-25 ENCOUNTER — Encounter (HOSPITAL_BASED_OUTPATIENT_CLINIC_OR_DEPARTMENT_OTHER): Payer: BC Managed Care – PPO | Admitting: Internal Medicine

## 2010-06-25 ENCOUNTER — Other Ambulatory Visit: Payer: Self-pay | Admitting: Internal Medicine

## 2010-06-25 ENCOUNTER — Encounter (HOSPITAL_COMMUNITY): Payer: Self-pay

## 2010-06-25 DIAGNOSIS — C3490 Malignant neoplasm of unspecified part of unspecified bronchus or lung: Secondary | ICD-10-CM | POA: Insufficient documentation

## 2010-06-25 DIAGNOSIS — C3410 Malignant neoplasm of upper lobe, unspecified bronchus or lung: Secondary | ICD-10-CM

## 2010-06-25 DIAGNOSIS — J984 Other disorders of lung: Secondary | ICD-10-CM

## 2010-06-25 HISTORY — DX: Malignant (primary) neoplasm, unspecified: C80.1

## 2010-06-25 LAB — COMPREHENSIVE METABOLIC PANEL
CO2: 28 mEq/L (ref 19–32)
Calcium: 9.7 mg/dL (ref 8.4–10.5)
Chloride: 103 mEq/L (ref 96–112)
Creatinine, Ser: 1.03 mg/dL (ref 0.40–1.50)
Glucose, Bld: 101 mg/dL — ABNORMAL HIGH (ref 70–99)
Sodium: 139 mEq/L (ref 135–145)
Total Bilirubin: 0.3 mg/dL (ref 0.3–1.2)
Total Protein: 6.6 g/dL (ref 6.0–8.3)

## 2010-06-25 LAB — CBC WITH DIFFERENTIAL/PLATELET
Eosinophils Absolute: 0.2 10*3/uL (ref 0.0–0.5)
HCT: 40.3 % (ref 38.4–49.9)
LYMPH%: 36.1 % (ref 14.0–49.0)
MONO#: 0.5 10*3/uL (ref 0.1–0.9)
NEUT#: 3 10*3/uL (ref 1.5–6.5)
NEUT%: 51.1 % (ref 39.0–75.0)
Platelets: 219 10*3/uL (ref 140–400)
WBC: 5.8 10*3/uL (ref 4.0–10.3)

## 2010-06-25 LAB — GLUCOSE, CAPILLARY: Glucose-Capillary: 107 mg/dL — ABNORMAL HIGH (ref 70–99)

## 2010-06-25 MED ORDER — FLUDEOXYGLUCOSE F - 18 (FDG) INJECTION
18.1000 | Freq: Once | INTRAVENOUS | Status: AC | PRN
  Administered 2010-06-25: 18.1 via INTRAVENOUS

## 2010-06-29 ENCOUNTER — Other Ambulatory Visit: Payer: Self-pay | Admitting: Internal Medicine

## 2010-06-29 ENCOUNTER — Encounter (HOSPITAL_BASED_OUTPATIENT_CLINIC_OR_DEPARTMENT_OTHER): Payer: BC Managed Care – PPO | Admitting: Internal Medicine

## 2010-06-29 DIAGNOSIS — C3410 Malignant neoplasm of upper lobe, unspecified bronchus or lung: Secondary | ICD-10-CM

## 2010-06-29 DIAGNOSIS — J984 Other disorders of lung: Secondary | ICD-10-CM

## 2010-06-29 DIAGNOSIS — C3490 Malignant neoplasm of unspecified part of unspecified bronchus or lung: Secondary | ICD-10-CM

## 2010-07-07 ENCOUNTER — Ambulatory Visit: Payer: BC Managed Care – PPO | Admitting: Thoracic Surgery

## 2010-07-09 LAB — DIFFERENTIAL
Basophils Absolute: 0 10*3/uL (ref 0.0–0.1)
Basophils Relative: 0 % (ref 0–1)
Eosinophils Absolute: 0.1 10*3/uL (ref 0.0–0.7)
Eosinophils Relative: 2 % (ref 0–5)
Lymphocytes Relative: 40 % (ref 12–46)
Monocytes Absolute: 0.4 10*3/uL (ref 0.1–1.0)

## 2010-07-09 LAB — CARDIAC PANEL(CRET KIN+CKTOT+MB+TROPI)
CK, MB: 2.4 ng/mL (ref 0.3–4.0)
Relative Index: 1.4 (ref 0.0–2.5)
Relative Index: 1.4 (ref 0.0–2.5)
Relative Index: 1.5 (ref 0.0–2.5)
Total CK: 169 U/L (ref 7–232)
Troponin I: 0.02 ng/mL (ref 0.00–0.06)
Troponin I: <0.01 ng/mL (ref 0.00–0.06)

## 2010-07-09 LAB — CBC
HCT: 43.8 % (ref 39.0–52.0)
MCHC: 34.5 g/dL (ref 30.0–36.0)
MCV: 100.4 fL — ABNORMAL HIGH (ref 78.0–100.0)
Platelets: 205 10*3/uL (ref 150–400)
RDW: 12.9 % (ref 11.5–15.5)

## 2010-07-09 LAB — BASIC METABOLIC PANEL
BUN: 10 mg/dL (ref 6–23)
Calcium: 8.8 mg/dL (ref 8.4–10.5)
Creatinine, Ser: 0.92 mg/dL (ref 0.4–1.5)
GFR calc Af Amer: >60 mL/min (ref >60)

## 2010-07-09 LAB — PROTIME-INR: Prothrombin Time: 13.9 seconds (ref 11.6–15.2)

## 2010-07-14 ENCOUNTER — Other Ambulatory Visit (HOSPITAL_COMMUNITY): Payer: Self-pay

## 2010-08-17 ENCOUNTER — Encounter (INDEPENDENT_AMBULATORY_CARE_PROVIDER_SITE_OTHER): Payer: BC Managed Care – PPO | Admitting: Thoracic Surgery (Cardiothoracic Vascular Surgery)

## 2010-08-17 DIAGNOSIS — C3490 Malignant neoplasm of unspecified part of unspecified bronchus or lung: Secondary | ICD-10-CM

## 2010-08-17 NOTE — Assessment & Plan Note (Signed)
OFFICE VISIT   ERRON, WENGERT  DOB:  [birth year]                                        June 18, 2007  CHART #:  [id number]   HISTORY OF PRESENTING ILLNESS:  [name] is a 65 year old male who is  status post right upper lobectomy for stage IA adenocarcinoma in October  of 2007.  He was last seen in the office by Dr. Dorris Fetch January 22, 2007, where he was doing well.  His only complaint at that time was that  he undergone a recent knee replacement in August and was recovering from  this.  Patient denies any complaints at this time,  He denies any  hemoptysis or significant weight loss.  Denies any further pain at the  right thoracotomy site.   PHYSICAL EXAMINATION:  He is alert, oriented and cooperative and in no  acute distress.  Latest vital signs reveal that his blood pressure is 143/89, pulse rate  is 92, respiratory rate 18, O2 saturation 95%.  CARDIAC:  Regular rate and rhythm.  S1, S2 without murmurs, gallops or  rubs.  LUNGS:  Clear to auscultation bilaterally.  No wheezing or rhonchi.  EXTREMITIES:  No cyanosis, clubbing or edema.  Right thoracotomy incision well healed.  No signs of infection.  Patient  had no signs of cervical or supraclavicular adenopathy.   Chest x-ray done today revealed no new nodules or significant  adenopathy.   Patient has an appointment to see Dr. Arbutus Ped in April.  He is going to  have another CAT scan of the chest done on July 16, 2007.  Dr.  Dorris Fetch will see the patient three months from now again.  The  importance of continued followup was emphasized.   Salvatore Decent Dorris Fetch, M.D.  Electronically Signed   SCH/MEDQ  D:  06/18/2007  T:  06/18/2007  Job:  [id number]

## 2010-08-17 NOTE — Assessment & Plan Note (Signed)
OFFICE VISIT   Andrew, Coleman  DOB:  18-Mar-1946                                        January 31, 2008  CHART #:  [id number]   The patient is a 65 year old gentleman who had a right upper lobectomy  for stage I adenocarcinoma 2 years ago.  He now returns for his 2-year  followup visit.  He is recently seeing Dr. Arbutus Ped.  He states that he  has been doing very well.  His biggest problem is incision.  He has  fatty deposition of his eyelids and has difficulty looking at his  computer screen and other times in the mornings, his eyes were almost  completely swollen shut.  Took a visual field test, but unfortunately  that was done in the afternoon and did not qualify him to have that  done, but he still has difficulty with that particularly in the  mornings.  From a respiratory standpoint, he has been doing well.  He is  not having any problems with his breathing.  He has not had any  significant weight loss, overall he feels well.   CURRENT MEDICATIONS:  Diltiazem 360 mg daily, hydrochlorothiazide 12.5  mg daily, Lipitor 10 mg daily, lisinopril 10 mg daily, Centrum Silver,  aspirin 81 mg daily, Levitra  acetaminophen 500 mg daily, Metamucil  daily, docusate sodium daily, amitriptyline 100 mg daily, and Reglan 10  mg p.r.n.  He also takes Flomax 0.4 mg daily.  He also uses naproxen and  Cafergot p.r.n.   ALLERGIES:  He has no known drug allergies.   The patient medical history form is reviewed and is on the chart.   PHYSICAL EXAMINATION:  GENERAL:  The patient is a well-appearing 62-year-  old white male, in no acute distress.  VITAL SIGNS:  His blood pressure is 140/93, pulse 75, respirations of  14, and his ox saturation is 95% on room air.  LUNGS:  Clear to auscultation and percussion.  NECK:  There is no cervical, supraclavicular, axillary, or epitrochlear  adenopathy.   His CT scan from January 10, 2008, is reviewed.  He has a large  gallstone,  which is unchanged.  There is no evidence of recurrent  disease.   IMPRESSION:  The patient is a 65 year old gentleman.  He is now 2 years  out from lobectomy for stage IA non-small cell carcinoma with no  evidence of recurrent disease.  This is obviously a big milestone for  him as most recurrence to occur within the first 2 years, however, there  is still a significant incidence of recurrence after 5 years, and we  will need to continue to follow him after that time at least.  I will  plan on seeing him back in a year.  He has an appointment with Dr.  Arbutus Ped in the spring.  So, there is no reason to duplicate appointments  at that point in time.  We will plan to do a CT scan, when I will see  him back in October of next year.   Salvatore Decent Dorris Fetch, M.D.  Electronically Signed   SCH/MEDQ  D:  01/31/2008  T:  02/01/2008  Job:  [id number]   cc:   Charlcie Cradle. Delford Field, MD, [facility]  Tasia Catchings, M.D.  Lajuana Matte, MD

## 2010-08-17 NOTE — Assessment & Plan Note (Signed)
OFFICE VISIT   KAISER, BELLUOMINI  DOB:  10-07-1945                                        January 22, 2007  CHART #:  [id number]   HISTORY OF PRESENT ILLNESS:  Mr. Cauthon is a 65 year old gentleman who  had right upper lobectomy for stage 1-A adenocarcinoma in October of  2007. I last saw him in the office in July of this year, at which time  he was doing well overall but was having problems with his knee. He  subsequently has had his knee replaced in August and has been doing well  since that time, although he says he just now is getting to where he can  walk normally again. From the standpoint of his breathing, he has no  complaints. He has not had any problems with wheezing. He has a chronic  cough, which pre-dates his surgery. This has been non-productive. There  has been no hemoptysis. He has not noted any significant weight loss. He  has noted some pain back at his thoracotomy site since the weather  turned cold again.   PHYSICAL EXAMINATION:  GENERAL:  Mr. Havey is a well appearing 61-year-  old white male in no acute distress.  VITAL SIGNS:  Blood pressure 148/91, pulse 101, respiratory rate 18.  Oxygen saturation 98% on room air.  LUNGS:  Clear with equal breath sounds bilaterally.  CARDIAC:  Regular rate and rhythm. Normal S1 and S2. No murmur, rub, or  gallop.  LYMPH:  He has no cervical, supraclavicular, axillary, or epitrochlear  adenopathy.  SKIN:  his incisions are well healed.   PAST MEDICAL HISTORY:  The patient's medical history form is reviewed  and is on the chart.   REVIEW OF SYSTEMS:  His only complaint is migraines. All other systems  are negative.   CURRENT MEDICATIONS:  Diltiazem 360 mg daily, hydrochlorothiazide 12.5  mg daily, Lipitor 10 mg daily, Lisinopril 10 mg which is new, Centrum  Silver daily, baby aspirin daily, Keppra 500 mg in the morning adn 1000  mg in the evening, amitriptyline 100 mg q.h.s., Reglan 10 mg if  needed,  Naproxen 550 mg as needed, Cafergot 100 mg as needed.   DIAGNOSTIC STUDIES:  His chest CT shows no recurrent tumor. There has  been improvement in his postoperative changes in the right apex. There  are no new lung nodules and no significant adenopathy. No evidence of  hepatic or adrenal metastasis.   MEDICAL DECISION MAKING:  Mr. Adkins is a 65 year old gentleman. He is  now 1 year out from a right upper lobectomy for a stage 1-A  adenocarcinoma. He has no evidence of recurrent disease at this time. I  once again, reinforced with him, the importance of continued followup.  Will plan to continue to see him on about an every 3 month basis for the  first 2 years and then every 6 months after that. He had an appointment  for another CT scan in November, which he does not need. He has an  appointment to see Dr. Gwenyth Bouillon at that time. I suggest that he may try  to contact Dr. Sharlene Dory office and see if they could space it out so  that one or the other of [place] sees him every 3 months, rather than have  him go to multiple doctor's in a  short period of time. I will plan to  see him back in 6 months. Will do another CT of the chest at that time.   Salvatore Decent Dorris Fetch, M.D.  Electronically Signed   SCH/MEDQ  D:  01/22/2007  T:  01/22/2007  Job:  [id number]   cc:   Tasia Catchings, M.D.  Lajuana Matte, MD  Charlcie Cradle Delford Field, MD, FCCP

## 2010-08-17 NOTE — Assessment & Plan Note (Signed)
OFFICE VISIT   [name], [name]  DOB:  05-15-1945                                        September 28, 2007  CHART #:  [id number]   The patient is a 65 year old gentleman who had a right upper lobectomy  for a stage 1 adenocarcinoma in October 2007.  We last saw him in the  office on March 16.  At that time, he was doing well.  He subsequently  saw Dr. Arbutus Ped, and he had a CT of his chest done in April.  I did not  see him at that time, but his CT at that time showed no evidence of  metastatic disease.  There was a small nodule in the right lower pole of  his thyroid.   The patient states since his last visit, he has been doing extremely  well.  He does not have any complaints.  He has an occasional cough  related to his blood pressure medication.  He has not had any productive  cough or hemoptysis.  He is not having problems with wheezing.  He has  not had any recent pulmonary infections.  No significant weight loss.  No headaches or visual changes.  No new bone or joint pain.   CURRENT MEDICATIONS:  1. Diltiazem.  2. Hydrochlorothiazide.  3. Lipitor.  4. Lisinopril.  5. Aspirin.  6. Levetiracetam.  7. Metamucil.  8. Docusate.  9. Amitriptyline.  10.Metoclopramide.  11.Naproxen.  12.Cafergot.   Please see the medication list for dosages.   ALLERGIES:  He has no known drug allergies.   REVIEW OF SYSTEMS:  See HPI, otherwise negative.   PHYSICAL EXAMINATION:  GENERAL:  The patient is a 66 year old white male  in no acute distress.  He is well-developed and well-nourished.  VITAL SIGNS:  Blood pressure 137/88, pulse 67, respirations were 18, and  his oxygen saturation is 96% on room air.  NEUROLOGIC:  He is grossly intact.  HEENT:  Unremarkable.  NECK:  Supple without thyromegaly or adenopathy.  LUNGS:  Clear diminished breath sounds at the right base but otherwise  clear.  CARDIAC:  Regular rate and rhythm.  Normal S1 and S2.  His thoracotomy  wound and chest tube sites are well-healed.  LYMPHATICS:  There is no cervical, subclavicular, axillary, or  epitrochlear adenopathy.   Chest x-ray shows no evidence of recurrent disease.   IMPRESSION:  The patient is doing very well at this point in time.  He  is now about 21 months out from his surgery.  October will be his 2-year  anniversary.  He has an appointment to see Dr. Arbutus Ped at that time, and  he will have the CT scan at that time.  I will plan on seeing him around  at that time as well and then after that we can probably go to every-six-  month followup alternating with Dr. Arbutus Ped and myself.   Salvatore Decent Dorris Fetch, M.D.  Electronically Signed   SCH/MEDQ  D:  09/28/2007  T:  09/28/2007  Job:  [id number]   cc:   Tasia Catchings, M.D.  Lajuana Matte, MD  Charlcie Cradle Delford Field, MD, FCCP

## 2010-08-17 NOTE — Assessment & Plan Note (Signed)
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  He was last  seen a year ago in my office for a 3-year followup visit, had no  evidence of recurrent disease.  He did have a CT at that time, which  showed multiple small nodules, the largest of which was 7 mm and they  have not changed in 6 months.  He saw Dr. Jerolyn Center in October and had a  CT scan done at that time, which showed some slight enlargement of the  most inferior of the 3 nodules from 5 x 4 mm to 5 x 7 mm.  The patient  states that he had been doing well.  He does have a little bit of  shortness of breath and an cold air with the exertion, but otherwise he  has not been having any trouble.  He has not had any problems with his  heart.  He was worked up for some cardiac issues back a year ago, has  not had any issues with that since that time.  He says he has been  working and overall he feels well.  His weight has been stable.  He has  not had any cough or hemoptysis.   PAST MEDICAL HISTORY:  Significant for stage IA lung cancer status post  right upper lobectomy in 2007, hypertension, hypercholesterolemia, sleep  apnea, chronic sinus disease, lower back surgery, tonsillectomy and  degenerative joint disease.   CURRENT MEDICATIONS:  1. Diltiazem/HCl 360 mg daily.  2. Diovan/HCT 320/25 one daily.  3. Crestor 5 mg daily.  4. Centrum Silver 1 tablet daily.  5. Vitamin D3 1000 units daily.  6. Baby aspirin 81 mg daily.  7. Keppra 500 mg q.a.m.  8. Rapaflo 8 mg daily.  9. Metamucil 3 capsules at bedtime.  10.Docusate 200 mg at bedtime.  11.Amitriptyline 100 mg at bedtime.  12.Keppra 1000 mg at bedtime.   ALLERGIES:  He has no known drug allergies.   REVIEW OF SYSTEMS:  See HPI,  otherwise negative.   PHYSICAL EXAMINATION:  General:  The patient is a well-appearing 64-year-  old gentleman in no acute distress.  Vital Signs:  Blood pressure is  136/84, pulse 66, respirations 16, his oxygen saturation is 97% on room  air.  Lungs:  Clear with equal breath sounds bilaterally.  Cardiac:  Regular rate and rhythm.  Normal S1 and S2.  No murmurs, rubs, or  gallops.  There is no cervical or supraclavicular adenopathy.   CT of the chest is reviewed.  Findings as previously noted.  There is 3  small nodules in the right lung.  They measure 5 mm in diameter, 6 mm in  diameter and the third nodules among previously noted has changed from 5  x 4 to 5 x 7 mm over a period of a year.   IMPRESSION:  The patient is a 65 year old gentleman who is now 4 years  out from resection for stage IA adenocarcinoma.  He has 3 small nodules  in his right lung, one of which may have increased  in size over a year,  although it has only been a couple of millimeters.  It is really hard to  believe that represents a recurrence, they cannot rule out that it could  be a new slow-growing tumor like a bronchoalveolar cell.  It does need  followup.  He is scheduled for a CT scan in April, which was scheduled  by Dr. Arbutus Ped.  I will plan to see him back after he has had that CT  scan.  He has any more difficulty in the meantime, I will be happy to  see him back sooner than that.   Salvatore Decent Dorris Fetch, M.D.  Electronically Signed   SCH/MEDQ  D:  02/08/2010  T:  02/09/2010  Job:  [id number]   cc:   Tasia Catchings, M.D.  Lajuana Matte, MD

## 2010-08-17 NOTE — Assessment & Plan Note (Signed)
OFFICE VISIT   KEISHAUN, HAZEL  DOB:  06-Mar-1946                                        October 17, 2006  CHART #:  [id number]   Mr. Footman is a 65 year old gentleman who had a right upper lobectomy in  01/2006.  This was a stage 1A T1 N0 adenocarcinoma.  He was last seen in  the office in 06/2006 at which time he was still having some pain from  his incision but otherwise was doing well.  A CT scan at that time  showed no evidence of recurrent disease.  He states that he has been  doing well.  He has been walking on a regular basis.  His activity is  mainly limited by knee pain.  He was being worked up and evaluated by  Dr. Priscille Kluver for knee surgery prior to this lung mass being discovered  and he has questions about going ahead and doing the knee surgery at  this point.  There has been no change in his medication regimen since  his last visit.   He denies any shortness of breath, wheezing, chest pain, new headaches,  or visual symptoms or any new lumps or bumps.  Denies any hemoptysis or  cough.   ON PHYSICAL EXAMINATION:  General:  Mr. Vignola is a 64 year old white  male in no acute distress.  Vital Signs:  His blood pressure is 140/99.  Pulse 84.  His oxygen  saturation is 96% on room air.  Lungs:  Diminished breath sounds at the right base, otherwise clear.  There is no wheezing.  Cardiac Exam:  Regular rate and rhythm.  Normal S1 and S2.  No rubs,  murmurs, or gallops.  Neck:  There are no carotid bruits.  Lymphatic:  There is no cervical, supraclavicular, axillary, or  epitrochlear adenopathy.   LABORATORY DATA:  Chest x-ray shows postoperative changes with active  disease and no evidence of recurrence.   IMPRESSION:  Mr. Kading is doing very well at this point in time.  He is  now nine months out from surgery.  His pain is much improved from when I  saw him in March.  He is physically active but his primary limitation is  his knee surgery.  He  had some type of apneic spell during his  bronchoscopy but he tolerated lung surgery without any difficulties and  without any signs of apnea in the postoperative period so I do not think  there is any problem with him undergoing knee surgery from my point of  view and I encouraged him to do so.  I will plan to see him  back in  three months.  That will be his one-year follow-up visit.  He will need  a CT scan of the chest at that time.   Salvatore Decent Dorris Fetch, M.D.  Electronically Signed   SCH/MEDQ  D:  10/17/2006  T:  10/17/2006  Job:  [id number]   cc:   Tasia Catchings, M.D.  Lajuana Matte, MD  Charlcie Cradle Delford Field, MD, FCCP

## 2010-08-17 NOTE — Op Note (Signed)
[name], [name]               ACCOUNT NO.:  [id number]   MEDICAL RECORD NO.:  [id number]          PATIENT TYPE:  AMB   LOCATION:  DAY                          FACILITY:  [facility]   PHYSICIAN:  John L. Rendall, M.D.  DATE OF BIRTH:  Dec 01, 1945   DATE OF PROCEDURE:  11/06/2006  DATE OF DISCHARGE:                               OPERATIVE REPORT   PREOPERATIVE DIAGNOSIS:  Torn medial and lateral menisci, left knee.   SURGICAL PROCEDURES:  Medial and lateral meniscectomy, left knee.   POSTOPERATIVE DIAGNOSIS:  Torn medial and lateral menisci, left knee.   SURGEON:  John L. Rendall, M.D.   ANESTHESIA:  MAC.   PROCEDURE:  Under MAC anesthesia the left knee is prepared with DuraPrep  and draped as a sterile field with a leg holder.  Standard portals were  made without tourniquet and the complex tear of the posterior horn of  the medial meniscus is found with grade II and III changes on the medial  femoral condyle with peeling hyaline cartilage.  Using a curved Merlin  shaver, a local chondroplasty was done, then resection of the posterior  horn of the medial meniscus back to stable meniscal rim was completed.  After this was completed, a generative tear of the lateral meniscus was  resected back to stable meniscal rim.  No significant chondromalacia in  the lateral compartment, patellofemoral compartment no significant  arthritic change, no  real change in either gutter.  The knee was then  copiously irrigated with saline, infiltrated with Marcaine with morphine  with epinephrine and a sterile compression bandage applied.  Punctures  closed with 3-0 nylon.  The patient returned to recovery in good  condition in preparation for discharge home once fully recovered.  He is  given Percocet for pain.      John L. Rendall, M.D.  Electronically Signed     JLR/MEDQ  D:  11/06/2006  T:  11/06/2006  Job:  [id number]

## 2010-08-17 NOTE — Assessment & Plan Note (Signed)
OFFICE VISIT   [name]  DOB:  [birth year]                                        February 10, 2009  CHART #:  [id number]   HISTORY:  The patient is a 65 year old gentleman who had a right upper  lobectomy for stage IA adenocarcinoma in October 2007.  He now returns  for a 3-year followup visit.  He was last seen in our office in October  of last year at which time he was doing well with no evidence of  recurrent disease.  He saw Dr. Arbutus Ped in the spring back in April and  was doing well at that point in time and than he saw Dr. Arbutus Ped again  in October.  He had a CT done in October which showed some stable small  nodules in the right lower lobe, one was 4-5 mm, the other was 7 mm, and  they were unchanged over 6 months.  Dr. Arbutus Ped felt that he was doing  well with no evidence for recurrent disease.  The patient states that he  was in the hospital about a month ago.  He had been having some  shortness of breath when walking up inclines.  He described this is a  tightness in his chest.  He had a cardiac workup, and apparently while  he was in the hospital he had 1 episode where he had some dizziness and  arm pain and at that time his blood pressure was markedly elevated at  over 200 systolic.  He was cleared from a cardiac standpoint and was  discharged and has been stable from that standpoint since then.   PAST MEDICAL HISTORY:  1. History of hypertension.  2. Hypercholesterolemia.  3. Chronic sinus disease.  4. Sleep apnea.  5. Lower back surgery.  6. Sinus surgery.  7. Tonsillectomy.  8. Degenerative joint disease.  9. Lung cancer stage IA resected in 2007.   CURRENT MEDICATIONS:  His lisinopril and hydrochlorothiazide were  discontinued and he was started on Diovan HCT 320/25 one tablet daily.  He also remains on diltiazem, Lipitor, aspirin, levetiracetam,  Metamucil, amitriptyline, Reglan p.r.n., Cafergot p.r.n., and Naprosyn  p.r.n.   He does still take Flomax as well.   ALLERGIES:  He has no known drug allergies.   SOCIAL HISTORY:  He is a nonsmoker.   REVIEW OF SYSTEMS:  See HPI, otherwise negative.  Specifically denies  new headaches or visual changes, new bone or joint pain, cough,  hemoptysis, wheezing, or shortness of breath other than when walking up  inclines.   PHYSICAL EXAMINATION:  The patient is a 65 year old gentleman in no  acute distress.  His blood pressure is 130/84, pulse 72, respirations  were 18, his ox saturation 96% on room air.  Lungs are clear with equal  breath sounds bilaterally.  His cardiac exam has regular rate and  rhythm.  Normal S1 and S2.  There was no cervical or supraclavicular  adenopathy.  His thoracotomy wound and chest tube sites were well  healed.   DIAGNOSTIC TESTS:  CT scan is reviewed, shows stable lung nodules and no  evidence of recurrent disease.   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  He has had no evidence or recurrent disease in the interval  since his surgery.  He does have some small lung nodules in the right  lower lobe which are followed on CT scan but have not changed in the  past 6 months.  He has a followup appoint with Dr. Arbutus Ped in the  spring.  I will have another CT at that time.  I will plan on seeing him  back in a year with a repeat CT as well.  Obviously if there are any  issues with the CT scan in the spring, I will be happy to see him at  that time as well.   [name], M.D.  Electronically Signed   SCH/MEDQ  D:  02/10/2009  T:  02/10/2009  Job:  [id number]   cc:   Charlcie Cradle. Delford Field, MD, [facility]  Tasia Catchings, M.D.  Lajuana Matte, MD

## 2010-08-20 NOTE — Op Note (Signed)
NAMELAMARI, BECKLES               ACCOUNT NO.:  [id number]   MEDICAL RECORD NO.:  [id number]          PATIENT TYPE:  INP   LOCATION:  3307                         FACILITY:  [facility]   PHYSICIAN:  Salvatore Decent. Dorris Fetch, M.D.DATE OF BIRTH:  September 05, 1945   DATE OF PROCEDURE:  01/23/2006  DATE OF DISCHARGE:                                 OPERATIVE REPORT   PREOPERATIVE DIAGNOSIS:  Right upper lobe mass.   POSTOPERATIVE DIAGNOSIS:  Adenocarcinoma.   PROCEDURE:  Right video-assisted thoracoscopy, right upper lobectomy and  mediastinal lymph node dissection.   SURGEON:  Salvatore Decent. Dorris Fetch, M.D.   ASSISTANTSalvatore Decent. Cornelius Moras, M.D.   SECOND ASSISTANT:  Pecola Leisure, PA   ANESTHESIA:  General.   FINDINGS:  Right upper lobe mass on frozen section.  Adenocarcinoma well  differentiated.  Final bronchial margin free of tumor.   CLINICAL NOTE:  Mr. Youman is a 65 year old gentleman who was recently found  to have a right upper lobe mass.  This by CT scan was a spiculated lesion  that seemed to be tracking along the right upper lobe bronchial tree.  A PET  scan showed increased metabolic activity.  The patient was advised to  undergo surgical biopsy.  It was felt that this lesion was very central in  the right upper lobe and would likely require lobectomy for removal but  given the findings on CT and PET scan, carcinoma cannot be ruled out with a  transthoracic needle biopsy or repeated bronchial biopsies.  The  indications, risks, benefits and alternatives were discussed in detail with  the patient and he understood and accepted risks and agreed to proceed.   OPERATIVE NOTE:  Mr. Dutton was brought to the preop holding area on  01/23/2006.  There the anesthesia service placed arterial blood pressure  monitoring lines and venous access was obtained.  Intravenous antibiotics  were administered.  PAS hose were placed for DVT prophylaxis.  The patient  was taken to the operating room,  anesthetized and intubated with a double-  lumen endotracheal tube and Foley catheter was placed.  He was placed in the  left lateral decubitus position and the right chest was prepped and draped  in the usual fashion.  Single lung ventilation of the left lung was carried  out.  The patient tolerated this well throughout the procedure.   Incision in the medial midaxillary line in the seventh intercostal space and  was carried through the skin and subcutaneous tissue.  Chest was entered  bluntly using hemostat.  A port was inserted and the scope was inserted  through the port.  It was immediately noted that there were good fissures.  There was no pleural effusion and no masses on the visceral or parietal  pleura.  There was some puckering of the visceral pleura and the right upper  lobe.  An additional port incision was made posterior to the tip of the  scapula for retraction purposes.  A utility thoracotomy was made anteriorly.  This was a muscle sparing incision.  No rib spreading was performed.  The  incision was  approximately 10 cm in length.  The lung was retracted  posteriorly exposing the medial pleural reflection.  This was taken down  over exposing the pulmonary vein branches.  There was bleeding from the  superior pulmonary vein during this dissection which required stapling with  ATW 35 stapler.  The second branch of the superior pulmonary vein then was  also divided with ATW 35 stapler. The dissection was carried superiorly and  the pulmonary arterial branches were identified, carefully dissected out.  The stapler was placed across the apical and anterior branches and fired.  There was a stapler malfunction.  There was significant bleeding as the  stapler did not fire across the pulmonary arterial branches.  A right angle  clamp was used to control the bleeding and a second firing of the stapler  was performed across the pulmonary arterial branches with good hemostasis.  There  was significant blood loss at this time but the patient remained  hemodynamically stable with volume resuscitation.  The final pulmonary  arterial branch then was identified and isolated in the fissure and divided  using ATW 35 stapler.  The minor fissure was completed with Echelon 60  stapler.  The major fissure was completed as well.  The right upper lobe  bronchus also was stapled with the Echelon stapler using a yellow staple  load.  Specimen was removed in a bag through the utility incision and sent  for frozen section.  Frozen section returned adenocarcinoma as well  differentiated.  The bronchial margin appeared clear but there was tumor in  the peribronchial lung tissue.  There was some residual fragment of the  right upper lobe as there was an azygous lobe which was not completely  removed.  This remaining fragment of tissue was removed with ATB 45 stapler  and sent for frozen section which revealed no tumor.  Lymph nodes along the  right upper lobe bronchus were removed.  Two nodes were sent as separate  specimens.  The right upper lobe bronchus then was mobilized. The Echelon 60  stapler was once again placed across, this time flush with the bronchus  intermedius.  A test inflation revealed good aeration of both the middle and  lower lobe.  The stapler was fired and the final bronchial margin was sent  to pathology.  The final bronchial margin was free of tumor.  The azygos  vein then was retracted superiorly and the parietal pleura overlying the  paratracheal nodes was incised and the paratracheal nodes were dissected out  and sent for permanent section.  Hemostasis was achieved.  The chest was  copiously irrigated with warm saline.  The test inflation revealed no  leakage from the bronchial stump.  A 36-French right-angle chest tube was  placed through the original incision and secured with a #1 silk suture.  The remaining incisions were closed in standard fashion.  The chest  tube was  placed to suction.  The right lung was reinflated no pericostal sutures were  used.  The patient was placed back in a supine position, extubated in the  operating room and taken to the postanesthetic care unit in good condition.           ______________________________  Salvatore Decent Dorris Fetch, M.D.     SCH/MEDQ  D:  01/23/2006  T:  01/24/2006  Job:  [id number]   cc:   Charlcie Cradle. Delford Field, MD, [facility]  Tasia Catchings, M.D.

## 2010-08-20 NOTE — Letter (Signed)
December 29, 2005     Salvatore Decent. Dorris Fetch, M.D.  [address]  [place], [place] [address]   RE:  BOSTEN, NEWSTROM  MRN:  [id number]  /  DOB:  Nov 15, 1945   Dear Dr. Dorris Fetch:   Thank you in advance for agreeing to see my patient, [name], who has  a right upper lobe mass lesion.  I am concerned about malignancy in this  patient, despite non-diagnostic trans-bronchoscopicc biopsies.  Included  with this dictation, please see all records with for this patient.   Sincerely,      Charlcie Cradle. Delford Field, MD, FCCP    PEW/MedQ  DD:  12/29/2005  DT:  12/31/2005  Job #:  [id number]

## 2010-08-20 NOTE — Assessment & Plan Note (Signed)
[facility]                               PULMONARY OFFICE NOTE   NAME:[name], [name]                      MRN:          [id number]  DATE:12/29/2005                            DOB:          1945/11/29    HISTORY:  Mr. Tritz is a 65 year old white male with a history of right  upper lobe lesion.  This patient has undergone lung biopsy with  bronchoscopy.  Biopsy results from this are positive for atypical gland  surrounded by amorphous material.  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  The PET scan  shows mild hypermetabolic activity.  Pulmonary functions are obtained and  reviewed and were essentially normal.  There is a concern now being raised  for potential malignancy in this case.  The patient returns to the office  today to discuss all of his test results.   PHYSICAL EXAMINATION:  On physical exam, his temperature is 97, blood  pressure 120/80, pulse 79, saturation 98% on room air.  Chest is completely  clear to auscultation and percussion.  There was no evidence of any  adventitious breath sounds.  Cardiac exam shows a regular rate and rhythm  without S3, normal S1 and S2.  Abdomen was unremarkable.  Extremities showed  no edema or clubbing.  Skin was clear.   IMPRESSION:  Right upper lobe lung mass.  I am quite concerned about the  possibility of malignancy here that we have not yet identified.  Based on  the constellation of findings from the trans-bronchoscopic biopsies and PET  scan, I am recommending to this patient thoracic surgery referral with open  biopsy and potential resection right upper lobe.  He is taking this under  advisement and agrees to referral to Dr. Charlett Lango of [facility].  We  have established this appointment today.  We will follow this patient up  expectantly.       Charlcie Cradle Delford Field, MD, FCCP      PEW/MedQ  DD:  12/29/2005  DT:   12/31/2005  Job #:  [id number]   cc:   Salvatore Decent. Dorris Fetch, M.D.  Tasia Catchings, M.D.

## 2010-08-20 NOTE — Assessment & Plan Note (Signed)
[facility]                               PULMONARY OFFICE NOTE   NAME:[name], [name]                      MRN:          [id number]  DATE:12/16/2005                            DOB:          [birth year]    CHIEF COMPLAINT:  Evaluate cough.   HISTORY OF PRESENT ILLNESS:  This is a 65 year old white male who has had  tightness and cough for several years.  He was being evaluated for knee  surgery, found to have a right upper lobe lung lesion.  The surgery was  canceled and he was referred for further evaluation.  He is short of breath  when he walks up a hill or 3 miles or more on level ground.  He has some  chest pressure like but a recent treadmill test was unremarkable.  He feels  as though he has tightness and cannot move air in or out.  He is short of  breath with activity only.  He has a dry cough.  There is some chest  discomfort and tightness.  There have been no acid symptoms but he is in a  proton pump inhibitor that was started because of the cough.  Since starting  this, the cough has not changed.  There has been no change in weight.  No  change in appetite.  There is no abdominal pain.  No difficulty swallowing.  The patient denies sore throat, tooth or dental problems.  He has had some  headaches.  Denies any nasal congestion, sneezing, itching, earache, anxiety  or depression.  The patient is a life-long never smoker.  He is referred for  further evaluation.   PAST MEDICAL HISTORY:  Medical:  1. History of hypertension.  2. Increased cholesterol.  3. History of chronic sinus disease.  4. History of sleep apnea but has not been treated for this.  5. Has had low back surgery in 1998.  6. Sinus surgery 1980.  7. Tonsillectomy 1957.   MEDICATION ALLERGIES:  NONE.   CURRENT MEDICATIONS:  1. Diltiazem 240 mg daily.  2. Hydrochlorothiazide 12.5 mg daily.  3. Lipitor 10 mg daily.  4. Centrum Silver daily.  5. Baby aspirin daily.  6.  Zegerid 40 mg b.i.d.  7. Amitriptyline 100 mg daily.  8. Keppra 1000 mg daily.  9. Metoclopramide p.r.n.   SOCIAL HISTORY:  Works in office work.  Is a vice president of operations.  Is married.  Has no children.  Is life-long never smoker.   FAMILY HISTORY:  Heart disease in the father.   REVIEW OF SYSTEMS:  Otherwise noncontributory.   PHYSICAL EXAM:  This is a slightly overweight white male in no distress.  Temperature 98, blood pressure 138/100, pulse 85, saturation 94% room air.  CHEST:  Showed distant breath sounds with no evidence of wheeze or rhonchi.  CARDIAC:  Exam showed a regular rate and rhythm without S3.  Normal S1 and  S2.  ABDOMEN:  Soft, nontender.  There was no organomegaly.  EXTREMITIES:  Showed no clubbing, edema or venous disease.  SKIN:  Clear.  NEUROLOGIC:  Exam was intact.  HEENT:  Exam showed no jugular venous distension.  No lymphadenopathy.  Oropharynx clear.  NECK:  Supple.   LABORATORY DATA:  CT scan of the chest showed an irregular opacity that is  3.6 x 2 cm in the anterior right upper lobe.  There is no adenopathy or  pleural disease seen.   IMPRESSION:  Right upper lobe mass, evaluate for cause.  Malignancy until  proven otherwise.  Needs to be evaluated.   RECOMMENDATIONS:  Pursue bronchoscopy which will be performed on December 21, 2005.  When the results are available further recommendations will  follow.                                   Charlcie Cradle Delford Field, MD, FCCP   PEW/MedQ  DD:  12/20/2005  DT:  12/20/2005  Job #:  [id number]   cc:   Tasia Catchings, M.D.

## 2010-08-20 NOTE — Op Note (Signed)
[name], [name]               ACCOUNT NO.:  [id number]   MEDICAL RECORD NO.:  [id number]          PATIENT TYPE:  AMB   LOCATION:  ENDO                         FACILITY:  [facility]   PHYSICIAN:  Charlcie Cradle. Delford Field, MD, FCCPDATE OF BIRTH:  [birth year] 02, 1945   DATE OF PROCEDURE:  12/21/2005  DATE OF DISCHARGE:                                 OPERATIVE REPORT   BRONCHOSCOPY OPERATIVE NOTE:   INDICATION:  Right upper lobe mass.  Evaluate for cause.   OPERATOR:  Charlcie Cradle. Delford Field, MD, FCCP   ANESTHESIA:  Xylocaine 1%, local.   PREOPERATIVE MEDICATION:  Fentanyl 100 mcg, Versed 5 mg IV push.   PROCEDURE:  The Olympus video bronchoscope was introduced via the right  naris.  The upper airways were visualized, were unremarkable except there  were findings compatible with reflux-induced airway disease.  The entire  tracheobronchial tree was visualized and revealed no interbronchial lesions.  Attention was then paid to the right upper lobe.  Transbronchial biopsies x5  were obtained.  Bronchial washings were obtained.   COMPLICATIONS:  Transient hypoxemia, corrected with jaw thrust and 100%  nonrebreather usage of oxygen.  The sats were stable throughout the  remaining portion of the case.   IMPRESSION:  Right upper lobe scarring, rule out malignancy but doubt rule  out granulomas.   RECOMMENDATIONS:  Follow up pathology and microbiology.      Charlcie Cradle Delford Field, MD, [facility]  Electronically Signed     PEW/MEDQ  D:  12/21/2005  T:  12/21/2005  Job:  [id number]

## 2010-08-20 NOTE — Cardiovascular Report (Signed)
   NAMEJASIM, [name]                         ACCOUNT NO.:  [id number]   MEDICAL RECORD NO.:  [id number]                   PATIENT TYPE:  OIB   LOCATION:  2899                                 FACILITY:  [facility]   PHYSICIAN:  Lesleigh Noe, M.D.            DATE OF BIRTH:  [birth year]   DATE OF PROCEDURE:  DATE OF DISCHARGE:                              CARDIAC CATHETERIZATION   INDICATIONS FOR PROCEDURE:  Abnormal stress Cardiolite, demonstrating  inferolateral and anterolateral ischemia.   PROCEDURE:  1. Left heart catheterization.  2. Selective coronary angiogram.  3. Left ventriculography.   DESCRIPTION OF PROCEDURE:  After informed consent, a #6 French sheath was  inserted into the right femoral artery using a modified Seldinger technique.  A #6 [place] multi-purpose catheterization was used for hemodynamic right  coronaries, left ventriculography, and selective left and right coronary  angiography. The patient tolerated the procedure without complications.   RESULTS:  1. Hemodynamics:     a. Left ventricular pressure 133/10.     b. Aortic pressure 133/93.        A. Left ventriculography: The left ventricular function is normal. EF           is 60%.        B. Coronary angiography:     c. Left main coronary normal.     d. Left anterior descending coronary: mid luminal irregularities. No        significant obstruction.     e. Ramus intermedius branch: tortuous mid luminal irregularities. No        significant obstruction.     f. Circumflex artery: The circumflex coronary artery gives origin to two        obtuse marginal branches. Irregularities are noted in    the first obtuse marginal. No significant obstruction is noted.        e. Right coronary: The right coronary artery is large, tortuous, and  contains irregularities. The PEA and LV branch are large.   CONCLUSION:  1. No significant coronary atherosclerotic obstructions noted.  2. Mild luminal irregularities  noted in the LAD and circumflex.  3.     Mildly elevated blood pressure.  4. Normal LV function.   PLAN:  Risk factor modification including therapy of hyperlipidemia and  therapy of blood pressure.                                               Lesleigh Noe, M.D.    HWS/MEDQ  D:  11/08/2001  T:  11/13/2001  Job:  [id number]   cc:   Genene Churn. Sherin Quarry, M.D.

## 2010-08-20 NOTE — Discharge Summary (Signed)
[name], [place]SEBROCK NO.:  [id number]   MEDICAL RECORD NO.:  [id number]          PATIENT TYPE:  INP   LOCATION:  2004                         FACILITY:  [facility]   PHYSICIAN:  Salvatore Decent. Dorris Fetch, M.D.DATE OF BIRTH:  05/24/1945   DATE OF ADMISSION:  01/23/2006  DATE OF DISCHARGE:  01/28/2006                               DISCHARGE SUMMARY   HISTORY OF PRESENT ILLNESS:  The patient is a 65 year old male referred  to Dr. Dorris Fetch for thoracic surgical consultation by Dr. Delford Field  regarding a right upper lobe lung mass.  The patient had a recent  physical examination by Dr. Sherin Quarry.  At that time, he complained of  problems related to his knee and was being evaluated for possible knee  surgery when a preop chest x-ray revealed a new right upper lobe lung  lesion.  This was not seen in a previous chest x-ray in 2003.  He  subsequently had a CT scan and was sent for pulmonary consultation to  Dr. Delford Field, who did bronchoscopy and transbronchial biopsies that was  nondiagnostic, but did show some atypia.  Washings revealed no malignant  cells.  He has subsequently undergone a PET scan, which showed the  lesion to be mildly hypermetabolic.  The patient has a dry cough, which  comes in spasms, but is infrequent.  He has no history of hemoptysis.  He does have a history of pneumonia, but that was greater than 10 years  ago.  He has no smoking or asbestos exposure.  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  He did have some exposure to Agent  Orange during the [place] War approximately 40 years ago, but has not  had any known health problems related to this.  He has shortness of  breath with dyspnea.  He recently underwent a stress test and cardiac  catheterization, which showed no coronary obstructive disease.  Upon  evaluation by Dr. Dorris Fetch, he was recommended to proceed  with  surgery for right upper lobe lobectomy.   PAST MEDICAL HISTORY:  1. Hypertension.  2. Hypercholesterolemia.  3. Chronic sinus disease.  4. Sleep apnea, not requiring treatment.  5. Lower back surgery in 1998.  6. Sinus surgery in 1980.  7. Tonsillectomy in 1957.  8. History of pneumonia in the past.  9. History of degenerative joint disease with knee pain.   FAMILY HISTORY:  Remarkable for heart disease.   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  He is married with no children.  He has never smoked.  He has  no history of asbestos exposure.  He does have a possible history of  exposure to Agent Orange in [place], as stated above.   ALLERGIES:  NO KNOWN DRUG ALLERGIES.   MEDICATIONS PRIOR TO ADMISSION:  1. Diltiazem 240 mg q.a.m.  2. Hydrochlorothiazide 12.5 mg q.a.m.  3. Lipitor 10 mg q.a.m.  4. Centrum Silver 1 tablet daily.  5. Baby aspirin 81  mg daily.  6. Amitriptyline 100 mg p.o. q.h.s.  7. Keppra 1000 mg q.h.s.  8. Metoclopramide 10 mg p.o. p.r.n.  9. Naprosyn 550 mg p.o. p.r.n.  10.Cafergot 100 mg p.o. p.r.n. migraines.   PHYSICAL EXAMINATION:  Please see the admission history and physical  done at the time of admission to the hospital.   HOSPITAL COURSE:  The patient was admitted electively.  On January 23, 2006, he was taken to the operating room at which time he underwent the  following procedure:  Right video-assisted thoracoscopy with right upper  lobectomy and mediastinal lymph node dissection.  The patient tolerated  the procedure well and was taken to the post-anesthesia care unit in  stable condition.   POSTOPERATIVE HOSPITAL COURSE:  The patient has done well.  He has  remained hemodynamically stable.  All routine lines, monitors, and  drainage devices have been discontinued in the standard fashion.  His  pathology reveals T1 N0 adenocarcinoma with negative margins.  He has  been seen in oncology consultation  by Dr. Arbutus Ped.  It is felt that  there is no benefit for adjuvant chemotherapy for stage IA, but routine  followup CT scans were recommended to the patient and followup  appointments were also made at the [facility] for discussion  of options, as well as any chemotherapeutic trials that he may be  interested in.  His laboratory values do reveal a mild postoperative  anemia.  He is stable in this regard.  His most recent hemoglobin and  hematocrit dated January 26, 2006 are 11.1 and 31.8, respectively.  His  incision is healing well without evidence of infection.  He has been  weaned from oxygen and maintains good saturations on room air.  His  activity has been increased in the routine manner using standard  protocols.  Overall, he is tentatively felt to be stable for discharge  in the morning of January 28, 2006 pending morning round reevaluation.   CONDITION ON DISCHARGE:  Stable and improving.   FINAL DIAGNOSES:  1. Adenocarcinoma of the lungs, stage IA, as described.  2. Postoperative anemia, stable.  3. Other diagnoses include the conditions listed in the above      dictation under past medical history.   DISCHARGE INSTRUCTIONS:  1. The patient will receive written instructions in regard to      medications, activity, diet, wound care, and followup.  2. Follow up with Dr. Dorris Fetch on February 09, 2006 with a chest x-      ray at that time.  3. Follow up with Dr. Arbutus Ped on February 27, 2006 at 10:00 a.m.   DISCHARGE MEDICATIONS:  1. Medications on discharge are as preoperatively.  2. Additionally for pain, Tylox 1-2 every q.4-6 h. as needed.      Rowe Clack, P.A.-C.    ______________________________  Salvatore Decent Dorris Fetch, M.D.    Sherryll Burger  D:  01/27/2006  T:  01/28/2006  Job:  [id number]   cc:   Charlcie Cradle. Delford Field, MD, [facility]  Tasia Catchings, M.D.  Lajuana Matte, MD  Lyn Records, M.D.

## 2010-08-26 NOTE — Assessment & Plan Note (Signed)
OFFICE VISIT  Andrew, Coleman DOB:  21-Jul-1945                                        Aug 17, 2010 CHART #:  [id number]  [name] is a 65 year old gentleman who had right upper lobectomy for stage I adenocarcinoma in October 2007.  He had been noted in late 2010 to have some small lung nodules in the right lung.  These had been followed since that time.  I last saw him in the office in November of last year, at which time, the nodules were stable to minimally enlarged. He was doing well otherwise.  In the interim since his last visit, he has been seen by Dr. Arbutus Ped.  In February, a CT scan was done and there was a 2.1-cm area, which may be the confluence of multiple smaller nodules.  PET scan was done and this showed no evidence of hypermetabolic activity.  The PET was done approximately a month after the CT and if anything the area appeared to be slightly smaller that may be due to differences in technique.  [name] states he has been doing well, otherwise, his weight has been stable.  He has little shortness of breath and walking up hills does not changed any.  He has had no other breathing issues nor had any cardiac issues.  He denies any cough or hemoptysis.  CURRENT MEDICATIONS: 1. Diltiazem 360 mg q.a.m. 2. Diovan HCT 320/25 one tablet daily. 3. Crestor 5 mg q.a.m. 4. Centrum Silver 1 tablet. 5. Vitamin D3 1000 units daily. 6. Aspirin 81 mg daily. 7. Keppra 500 mg daily. 8. Metamucil 3 capsules at bedtime. 9. Docusate sodium 200 mg at bedtime. 10.Amitriptyline 100 mg at bedtime. 11.Keppra 1000 mg in the evening.  ALLERGIES:  He has no known drug allergies.  REVIEW OF SYSTEMS:  See HPI.  All other systems are negative.  PHYSICAL EXAMINATION:  VITAL SIGNS:  His blood pressure is 133/86, pulse 76, respirations 16, his oxygen saturation is 98% on room air.  GENERAL APPEARANCE:  [name] is a well-appearing 65 year old gentleman, in  no acute distress.  Well-developed, well-nourished.  NEUROLOGIC:  He is alert and oriented x3 with no focal deficits.  NECK:  Supple without thyromegaly, adenopathy, or bruits.  There is no cervical or supraclavicular adenopathy.  LUNGS:  Clear with equal breath sounds bilaterally.  CARDIAC:  Regular rate and rhythm.  Normal S1-S2.  No rubs, murmurs, or gallops.  CT of the chest from February and PET scan from March were reviewed. There is an area in the setting of multiple previous nodules and that is more confluent.  This did not have hypermetabolic activity on the PET.  IMPRESSION:  [name] is now almost 5 years out from resection for right upper lobe stage IA adenocarcinoma.  He has multiple small right lung nodules, and there is a larger area of 1.9 to 2 cm in diameter, which appears to the confluence of multiple small nodules that were previously present.  These areas may have grown in size slightly, but are negative by PET scan.  A surgical excision would be an option with a negative PET scan.  I think this likely can be followed safely.  I would recommend to repeat CT scan, Dr. Arbutus Ped already scheduled one for 4 months.  I will plan to see the patient back around  the time.  Salvatore Decent Dorris Fetch, M.D. Electronically Signed  SCH/MEDQ  D:  08/17/2010  T:  08/18/2010  Job:  [id number]  cc:   Lajuana Matte, M.D. Tasia Catchings, M.D.

## 2010-10-25 ENCOUNTER — Encounter (HOSPITAL_BASED_OUTPATIENT_CLINIC_OR_DEPARTMENT_OTHER): Payer: BC Managed Care – PPO | Admitting: Internal Medicine

## 2010-10-25 ENCOUNTER — Encounter (HOSPITAL_COMMUNITY): Payer: Self-pay

## 2010-10-25 ENCOUNTER — Other Ambulatory Visit: Payer: Self-pay | Admitting: Internal Medicine

## 2010-10-25 ENCOUNTER — Ambulatory Visit (HOSPITAL_COMMUNITY)
Admission: RE | Admit: 2010-10-25 | Discharge: 2010-10-25 | Disposition: A | Discharge status: Home / Self Care | Payer: BC Managed Care – PPO | Source: Ambulatory Visit | Attending: Internal Medicine | Admitting: Internal Medicine

## 2010-10-25 DIAGNOSIS — J984 Other disorders of lung: Secondary | ICD-10-CM | POA: Insufficient documentation

## 2010-10-25 DIAGNOSIS — K5730 Diverticulosis of large intestine without perforation or abscess without bleeding: Secondary | ICD-10-CM | POA: Insufficient documentation

## 2010-10-25 DIAGNOSIS — C3410 Malignant neoplasm of upper lobe, unspecified bronchus or lung: Secondary | ICD-10-CM

## 2010-10-25 DIAGNOSIS — Z79899 Other long term (current) drug therapy: Secondary | ICD-10-CM | POA: Insufficient documentation

## 2010-10-25 DIAGNOSIS — C3490 Malignant neoplasm of unspecified part of unspecified bronchus or lung: Secondary | ICD-10-CM | POA: Insufficient documentation

## 2010-10-25 LAB — CBC WITH DIFFERENTIAL/PLATELET
BASO%: 1.1 % (ref 0.0–2.0)
Eosinophils Absolute: 0.2 10*3/uL (ref 0.0–0.5)
LYMPH%: 40.3 % (ref 14.0–49.0)
MCHC: 34.1 g/dL (ref 32.0–36.0)
MONO#: 0.5 10*3/uL (ref 0.1–0.9)
MONO%: 8.4 % (ref 0.0–14.0)
NEUT#: 3.1 10*3/uL (ref 1.5–6.5)
Platelets: 187 10*3/uL (ref 140–400)
RBC: 4.01 10*6/uL — ABNORMAL LOW (ref 4.20–5.82)
RDW: 12.8 % (ref 11.0–14.6)
WBC: 6.5 10*3/uL (ref 4.0–10.3)

## 2010-10-25 LAB — CMP (CANCER CENTER ONLY)
Alkaline Phosphatase: 57 U/L (ref 26–84)
BUN, Bld: 13 mg/dL (ref 7–22)
CO2: 29 mEq/L (ref 18–33)
Creat: 1 mg/dl (ref 0.6–1.2)
Glucose, Bld: 105 mg/dL (ref 73–118)
Sodium: 140 mEq/L (ref 128–145)
Total Bilirubin: 0.5 mg/dl (ref 0.20–1.60)

## 2010-10-28 ENCOUNTER — Encounter (HOSPITAL_BASED_OUTPATIENT_CLINIC_OR_DEPARTMENT_OTHER): Payer: BC Managed Care – PPO | Admitting: Internal Medicine

## 2010-10-28 DIAGNOSIS — C3410 Malignant neoplasm of upper lobe, unspecified bronchus or lung: Secondary | ICD-10-CM

## 2010-11-29 ENCOUNTER — Encounter: Payer: BC Managed Care – PPO | Admitting: Thoracic Surgery (Cardiothoracic Vascular Surgery)

## 2011-01-06 ENCOUNTER — Encounter: Payer: Self-pay | Admitting: Thoracic Surgery (Cardiothoracic Vascular Surgery)

## 2011-01-06 DIAGNOSIS — J349 Unspecified disorder of nose and nasal sinuses: Secondary | ICD-10-CM | POA: Insufficient documentation

## 2011-01-06 DIAGNOSIS — G4730 Sleep apnea, unspecified: Secondary | ICD-10-CM

## 2011-01-06 DIAGNOSIS — Z8701 Personal history of pneumonia (recurrent): Secondary | ICD-10-CM | POA: Insufficient documentation

## 2011-01-06 DIAGNOSIS — Z8739 Personal history of other diseases of the musculoskeletal system and connective tissue: Secondary | ICD-10-CM | POA: Insufficient documentation

## 2011-01-06 DIAGNOSIS — Z77090 Contact with and (suspected) exposure to asbestos: Secondary | ICD-10-CM

## 2011-01-11 ENCOUNTER — Ambulatory Visit (INDEPENDENT_AMBULATORY_CARE_PROVIDER_SITE_OTHER): Payer: BC Managed Care – PPO | Admitting: Thoracic Surgery (Cardiothoracic Vascular Surgery)

## 2011-01-11 ENCOUNTER — Encounter: Payer: Self-pay | Admitting: Thoracic Surgery (Cardiothoracic Vascular Surgery)

## 2011-01-11 VITALS — BP 119/79 | HR 93 | Resp 16 | Ht 72.0 in | Wt 240.0 lb

## 2011-01-11 DIAGNOSIS — C3490 Malignant neoplasm of unspecified part of unspecified bronchus or lung: Secondary | ICD-10-CM

## 2011-01-11 NOTE — Progress Notes (Signed)
PCP is Albertha Ghee, MD Referring Provider is Shan Levans, MD  Chief Complaint  Patient presents with  . Follow-up     4 mo f/u with ct chest per [facility] 10/25/10    HPI: [name] is a 65 year old gentleman who had a right upper lobectomy for stage I adenocarcinoma in October of 2007/in the office on 08/17/2010 which time he was doing well CT scan in late 2010 to have some small lung nodules in the right lung those were followed one point there was a question of there were minimally enlarged a PET scan was done and showed no evidence of hypermetabolic activity that was back in March. He had a repeat CT scan done in July of this year which showed no change in the nodules. He states this is his last visit here in May he's been doing well he's not had any source of breath other than when he walks rapidly up a hill which is his baseline his weights been stable no problem with cough, wheezing or hemoptysis.  Past Medical History  Diagnosis Date  . Hypertension   . Cancer   . Hypercholesterolemia   . Sinus disease   . Sleep apnea   . History of pneumonia   . History of degenerative joint disease     WITH KNEE PAIN  . Hx of asbestos exposure     Past Surgical History  Procedure Date  . Cystourethroscopy, gyrus turp. 04/16/2010  . Torn medial and lateral menisci, left knee. 11/06/2006  . Right video-assisted thoracoscopy, right upper lobectomy and 01/23/2006  . The olympus video bronchoscope was introduced via the right 12/21/2005    No family history on file.  Social History History  Substance Use Topics  . Smoking status: Never Smoker   . Smokeless tobacco: Not on file  . Alcohol Use: No    Current Outpatient Prescriptions  Medication Sig Dispense Refill  . aspirin 81 MG tablet Take 81 mg by mouth daily.        . beta carotene w/minerals (OCUVITE) tablet Take 1 tablet by mouth daily.        . calcium citrate-vitamin D (CITRACAL+D) 315-200 MG-UNIT per tablet Take 1 tablet  by mouth 2 (two) times daily.        [name] diltiazem (CARDIZEM CD) 360 MG 24 hr capsule Take 360 mg by mouth daily.        . ergotamine-caffeine (CAFERGOT) 1-100 MG per tablet Take 2 tablets by mouth once. Two tablets at onset of attack; then 1 tablet every 30 minutes as needed; maximum: 6 tablets per attack; do not exceed 10 tablets/week       . levETIRAcetam (KEPPRA) 500 MG tablet Take 500 mg by mouth every 12 (twelve) hours. 1000MG  AT BEDTIME AS WELL       . metoCLOPramide (REGLAN) 10 MG tablet Take 10 mg by mouth 4 (four) times daily.        . naproxen sodium (ANAPROX) 550 MG tablet Take 550 mg by mouth 2 (two) times daily with a meal.        . nitroGLYCERIN (NITROSTAT) 0.4 MG SL tablet Place 0.4 mg under the tongue every 5 (five) minutes as needed.        . psyllium (REGULOID) 0.52 G capsule Take 0.52 g by mouth daily.        . rosuvastatin (CRESTOR) 5 MG tablet Take 5 mg by mouth daily.        . valsartan-hydrochlorothiazide (DIOVAN-HCT) 320-25 MG per  tablet Take 1 tablet by mouth daily.          No Known Allergies  Review of Systems: See history of present illness all other systems are negative  BP 119/79  Pulse 93  Resp 16  Ht 6' (1.829 m)  Wt 240 lb (108.863 kg)  BMI 32.55 kg/m2  SpO2 97% Physical Exam: On exam [name] is a well-appearing 65 year old white male in no acute distress in general he is well-developed and well-nourished Neurologically alert and oriented x3 no focal deficits There's no cervical or subclavicular adenopathy Lungs are clear with equal breath sounds bilaterally Cardiac exam has regular rate and rhythm normal S1-S2 no rubs or murmurs  Diagnostic Tests: CT of chest from July of this year shows no change in multiple small right lower lobe nodules  Impression: [name] is a 65 year old gentleman who is now 5 years out from right upper lobectomy for a stage IA non-small cell carcinoma. He does some small right lower lobe nodules that have been stable over  a period of time and are negative by PET CT. at this point I would consider him a cure from his original primary. However he does need a continued followup of the existing small nodules. He is scheduled for a CT scan next July by Dr. Arbutus Ped   Plan: I'll plan to see him back next number after the CT scan which Dr. Arbutus Ped and scheduled. [name] knows to call at any time if he has any questions or concerns in the interim.

## 2011-01-17 LAB — BASIC METABOLIC PANEL
BUN: 12
CO2: 31
Calcium: 9.1
Chloride: 102
Creatinine, Ser: 0.94
GFR calc Af Amer: >60
Glucose, Bld: 111 — ABNORMAL HIGH

## 2011-05-12 ENCOUNTER — Other Ambulatory Visit: Payer: Self-pay | Admitting: Internal Medicine

## 2011-05-12 ENCOUNTER — Telehealth: Payer: Self-pay | Admitting: Internal Medicine

## 2011-05-12 DIAGNOSIS — C3490 Malignant neoplasm of unspecified part of unspecified bronchus or lung: Secondary | ICD-10-CM

## 2011-05-12 NOTE — Telephone Encounter (Signed)
S/w the pt and he is aware of the July appts for lab.md and ct scan appt. Pt requested to have his appts mailed to him. Mailed the pt his appts

## 2011-08-05 DIAGNOSIS — G43119 Migraine with aura, intractable, without status migrainosus: Secondary | ICD-10-CM | POA: Diagnosis not present

## 2011-08-05 DIAGNOSIS — Z79899 Other long term (current) drug therapy: Secondary | ICD-10-CM | POA: Diagnosis not present

## 2011-08-05 DIAGNOSIS — R51 Headache: Secondary | ICD-10-CM | POA: Diagnosis not present

## 2011-10-19 ENCOUNTER — Other Ambulatory Visit: Payer: BC Managed Care – PPO | Admitting: Lab

## 2011-10-20 ENCOUNTER — Encounter (HOSPITAL_COMMUNITY): Payer: Self-pay

## 2011-10-20 ENCOUNTER — Ambulatory Visit (HOSPITAL_COMMUNITY)
Admission: RE | Admit: 2011-10-20 | Discharge: 2011-10-20 | Disposition: A | Discharge status: Home / Self Care | Payer: Medicare Other | Source: Ambulatory Visit | Attending: Internal Medicine | Admitting: Internal Medicine

## 2011-10-20 ENCOUNTER — Other Ambulatory Visit (HOSPITAL_BASED_OUTPATIENT_CLINIC_OR_DEPARTMENT_OTHER): Payer: Medicare Other | Admitting: Lab

## 2011-10-20 DIAGNOSIS — K8020 Calculus of gallbladder without cholecystitis without obstruction: Secondary | ICD-10-CM | POA: Insufficient documentation

## 2011-10-20 DIAGNOSIS — C3490 Malignant neoplasm of unspecified part of unspecified bronchus or lung: Secondary | ICD-10-CM | POA: Insufficient documentation

## 2011-10-20 DIAGNOSIS — R918 Other nonspecific abnormal finding of lung field: Secondary | ICD-10-CM | POA: Diagnosis not present

## 2011-10-20 DIAGNOSIS — C3410 Malignant neoplasm of upper lobe, unspecified bronchus or lung: Secondary | ICD-10-CM

## 2011-10-20 DIAGNOSIS — R911 Solitary pulmonary nodule: Secondary | ICD-10-CM | POA: Diagnosis not present

## 2011-10-20 LAB — CMP (CANCER CENTER ONLY)
ALT(SGPT): 31 U/L (ref 10–47)
AST: 24 U/L (ref 11–38)
Albumin: 3.8 g/dL (ref 3.3–5.5)
Alkaline Phosphatase: 52 U/L (ref 26–84)
Calcium: 9 mg/dL (ref 8.0–10.3)
Chloride: 101 mEq/L (ref 98–108)
Potassium: 3.4 mEq/L (ref 3.3–4.7)
Sodium: 139 mEq/L (ref 128–145)
Total Protein: 6.7 g/dL (ref 6.4–8.1)

## 2011-10-20 LAB — CBC WITH DIFFERENTIAL/PLATELET
BASO%: 0.5 % (ref 0.0–2.0)
Basophils Absolute: 0 10*3/uL (ref 0.0–0.1)
EOS%: 3.9 % (ref 0.0–7.0)
HGB: 14.2 g/dL (ref 13.0–17.1)
MCH: 34.5 pg — ABNORMAL HIGH (ref 27.2–33.4)
MCHC: 35.3 g/dL (ref 32.0–36.0)
RBC: 4.13 10*6/uL — ABNORMAL LOW (ref 4.20–5.82)
RDW: 12.8 % (ref 11.0–14.6)
lymph#: 2.4 10*3/uL (ref 0.9–3.3)

## 2011-10-20 MED ORDER — IOHEXOL 300 MG/ML  SOLN
100.0000 mL | Freq: Once | INTRAMUSCULAR | Status: AC | PRN
  Administered 2011-10-20: 100 mL via INTRAVENOUS

## 2011-10-26 ENCOUNTER — Ambulatory Visit (HOSPITAL_BASED_OUTPATIENT_CLINIC_OR_DEPARTMENT_OTHER): Payer: Medicare Other | Admitting: Internal Medicine

## 2011-10-26 ENCOUNTER — Telehealth: Payer: Self-pay | Admitting: *Deleted

## 2011-10-26 VITALS — BP 130/81 | HR 69 | Temp 96.9°F | Ht 72.0 in | Wt 251.7 lb

## 2011-10-26 DIAGNOSIS — C3490 Malignant neoplasm of unspecified part of unspecified bronchus or lung: Secondary | ICD-10-CM | POA: Diagnosis not present

## 2011-10-26 NOTE — Telephone Encounter (Signed)
04-24-2012 for lab and scaqn 05-01-2012 md appointment printed out calendar and gave to the patient

## 2011-10-26 NOTE — Progress Notes (Signed)
[facility] [phone]   Fax:([phone]  OFFICE PROGRESS NOTE  Albertha Ghee, MD [facility] [address] [place] [place] [address]  DIAGNOSIS: Stage IA (T1, N0, MX) non-small cell lung cancer, adenocarcinoma diagnosed in September of 2007  PRIOR THERAPY: Status post right upper lobectomy under the care of Dr. Dorris Fetch on 01/23/2006.  CURRENT THERAPY: Observation.  INTERVAL HISTORY: [name] 66 y.o. male returns to the clinic today for annual followup visit accompanied by his wife. The patient has no complaints today. He denied having any significant weight loss or night sweats. He denied having any chest pain, shortness of breath, cough or hemoptysis. The patient has repeat CT scan of the chest performed recently and he is here today for evaluation and discussion of his scan results.  MEDICAL HISTORY: Past Medical History  Diagnosis Date  . Hypertension   . Hypercholesterolemia   . Sinus disease   . Sleep apnea   . History of pneumonia   . History of degenerative joint disease     WITH KNEE PAIN  . Hx of asbestos exposure   . lung ca dx'd 207    ALLERGIES:   has no known allergies.  MEDICATIONS:  Current Outpatient Prescriptions  Medication Sig Dispense Refill  . amitriptyline (ELAVIL) 100 MG tablet Take 100 mg by mouth at bedtime.      [name] aspirin 81 MG tablet Take 81 mg by mouth daily.        [name] diltiazem (CARDIZEM CD) 360 MG 24 hr capsule Take 360 mg by mouth daily.        [name] docusate sodium (COLACE) 100 MG capsule Take 100 mg by mouth 2 (two) times daily.      [name] levETIRAcetam (KEPPRA) 500 MG tablet Take 500 mg by mouth every 12 (twelve) hours. 1000MG  AT BEDTIME AS WELL       . metoprolol succinate (TOPROL-XL) 25 MG 24 hr tablet Take 25 mg by mouth daily.      . Multiple Vitamins-Minerals (CENTRUM SILVER PO) Take by mouth.      . Omega-3 Fat Ac-Cholecalciferol (MINICAPS  VITAMIN-D/OMEGA-3) [phone] MG-UNIT CAPS Take 1 tablet by mouth.      . psyllium (REGULOID) 0.52 G capsule Take 0.52 g by mouth daily.        . rosuvastatin (CRESTOR) 5 MG tablet Take 5 mg by mouth daily.        . valsartan-hydrochlorothiazide (DIOVAN-HCT) 320-25 MG per tablet Take 1 tablet by mouth daily.        . metoCLOPramide (REGLAN) 10 MG tablet Take 10 mg by mouth 4 (four) times daily.        . naproxen sodium (ANAPROX) 550 MG tablet Take 550 mg by mouth 2 (two) times daily with a meal.        . nitroGLYCERIN (NITROSTAT) 0.4 MG SL tablet Place 0.4 mg under the tongue every 5 (five) minutes as needed.          SURGICAL HISTORY:  Past Surgical History  Procedure Date  . Cystourethroscopy, gyrus turp. 04/16/2010  . Torn medial and lateral menisci, left knee. 11/06/2006  . Right video-assisted thoracoscopy, right upper lobectomy and 01/23/2006  . The olympus video bronchoscope was introduced via the right 12/21/2005    REVIEW OF SYSTEMS:  A comprehensive review of systems was negative.   PHYSICAL EXAMINATION: General appearance: alert, cooperative and no distress Head: Normocephalic, without obvious abnormality, atraumatic  Neck: no adenopathy Lymph nodes: Cervical, supraclavicular, and axillary nodes normal. Resp: clear to auscultation bilaterally Cardio: regular rate and rhythm, S1, S2 normal, no murmur, click, rub or gallop GI: soft, non-tender; bowel sounds normal; no masses,  no organomegaly Extremities: extremities normal, atraumatic, no cyanosis or edema  ECOG PERFORMANCE STATUS: 0 - Asymptomatic  Blood pressure 130/81, pulse 69, temperature 96.9 F (36.1 C), temperature source Oral, height 6' (1.829 m), weight 251 lb 11.2 oz (114.17 kg).  LABORATORY DATA: Lab Results  Component Value Date   WBC 5.9 10/20/2011   HGB 14.2 10/20/2011   HCT 40.3 10/20/2011   MCV 97.5 10/20/2011   PLT 176 10/20/2011      Chemistry      Component Value Date/Time   NA 139 10/20/2011 0903     NA 139 06/25/2010 0758   NA 139 06/25/2010 0758   K 3.4 10/20/2011 0903   K 3.9 06/25/2010 0758   K 3.9 06/25/2010 0758   CL 101 10/20/2011 0903   CL 103 06/25/2010 0758   CL 103 06/25/2010 0758   CO2 29 10/20/2011 0903   CO2 28 06/25/2010 0758   CO2 28 06/25/2010 0758   BUN 12 10/20/2011 0903   BUN 17 06/25/2010 0758   BUN 17 06/25/2010 0758   CREATININE 0.9 10/20/2011 0903   CREATININE 1.03 06/25/2010 0758   CREATININE 1.03 06/25/2010 0758      Component Value Date/Time   CALCIUM 9.0 10/20/2011 0903   CALCIUM 9.7 06/25/2010 0758   CALCIUM 9.7 06/25/2010 0758   ALKPHOS 52 10/20/2011 0903   ALKPHOS 53 06/25/2010 0758   ALKPHOS 53 06/25/2010 0758   AST 24 10/20/2011 0903   AST 22 06/25/2010 0758   AST 22 06/25/2010 0758   ALT 22 06/25/2010 0758   ALT 22 06/25/2010 0758   BILITOT 0.60 10/20/2011 0903   BILITOT 0.3 06/25/2010 0758   BILITOT 0.3 06/25/2010 0758       RADIOGRAPHIC STUDIES: Ct Chest W Contrast  10/20/2011  *RADIOLOGY REPORT*  Clinical Data: Lung cancer, with surgery only.  CT CHEST WITH CONTRAST  Technique:  Multidetector CT imaging of the chest was performed following the standard protocol during bolus administration of intravenous contrast.  Contrast: OMNIPAQUE IOHEXOL 300 MG/ML  SOLN  Comparison: 10/25/2010  Findings: Lung windows demonstrate surgical changes within the right upper lobe.  No evidence of locally recurrent disease along the row of surgical sutures.  More posterior and laterally is a mixed ground-glass and soft tissue density nodule.  This measures 13 x 8 mm on image 17 today versus 11 x 7 mm at the same level on the prior exam.  Extends inferiorly to measure 1.4 cm on image 20 versus 8 mm at the same level on the prior.  More lateral pleural-based right lung nodule currently measures 9 mm on image 23 versus 7 mm on the prior.  This is also likely mixed ground-glass and soft tissue density lesion.  A more inferior anterior right lung nodule measures 6 mm on image 25  compared with 4 mm on image 26 of the prior.  Soft tissue windows demonstrate stable small right supraclavicular node on image 4. Normal heart size without pericardial or pleural effusion.  Moderate hemidiaphragm elevation. No central pulmonary embolism, on this non-dedicated study.  No mediastinal or hilar adenopathy.  Limited abdominal imaging demonstrates cholelithiasis. Incompletely imaged but normal appearing adrenal glands.  Large colonic stool burden.  IMPRESSION:  1.  Surgical changes within the  right upper lobe.  Apparent enlargement of 3 right-sided lung nodules, primarily mixed ground- glass and soft tissue attenuation.  Findings are suspicious for either metastatic disease (especially if the primary was adenocarcinoma) or metachronous bronchogenic carcinoma or carcinomas. 2.  No evidence of thoracic nodal metastatic disease. 3. Possible constipation. 4.  Cholelithiasis.  Original Report Authenticated By: Consuello Bossier, M.D.    ASSESSMENT: This is a very pleasant 66 years old white male with history of stage IA non-small cell lung cancer status post right upper lobectomy and has been observation since October of 2007. CT scan of the chest performed recently showed increased in the size of the 3 right-sided lung nodules suspicious for metastatic disease versus metachronous bronchogenic carcinoma.  PLAN: I discussed the scan results and showed the images to the patient and his wife. I gave him several options for his condition including observation and repeat CT scan of the chest in 6 months versus consideration of biopsy followed by treatment. The patient would like to wait for 6 more months before any intervention. I will arrange for him to have repeat CT scan of the chest with contrast at that time and he would come back for followup visit. He is also scheduled to see Dr. Dorris Fetch on 11/01/2011 for evaluation.   All questions were answered. The patient knows to call the clinic with any  problems, questions or concerns. We can certainly see the patient much sooner if necessary.

## 2011-11-01 ENCOUNTER — Ambulatory Visit (INDEPENDENT_AMBULATORY_CARE_PROVIDER_SITE_OTHER): Payer: Medicare Other | Admitting: Thoracic Surgery (Cardiothoracic Vascular Surgery)

## 2011-11-01 ENCOUNTER — Encounter: Payer: Self-pay | Admitting: Thoracic Surgery (Cardiothoracic Vascular Surgery)

## 2011-11-01 VITALS — BP 125/81 | HR 71 | Resp 16 | Ht 72.0 in | Wt 237.0 lb

## 2011-11-01 DIAGNOSIS — Z9889 Other specified postprocedural states: Secondary | ICD-10-CM | POA: Diagnosis not present

## 2011-11-01 DIAGNOSIS — R918 Other nonspecific abnormal finding of lung field: Secondary | ICD-10-CM

## 2011-11-01 DIAGNOSIS — Z85118 Personal history of other malignant neoplasm of bronchus and lung: Secondary | ICD-10-CM | POA: Diagnosis not present

## 2011-11-01 DIAGNOSIS — Z902 Acquired absence of lung [part of]: Secondary | ICD-10-CM

## 2011-11-01 NOTE — Progress Notes (Signed)
HPI:  Mr. Heindel returns today for a scheduled visit. He had a right upper lobectomy for stage IA adenocarcinoma back in 2007. He was last in the office about a year ago. At that time he had 3 small nodules in the right lung, which which were negative by PET. He had a followup CT scan on 10/20/11. He saw Dr. Cyril Mourning at the scan was done and they decided to repeat his CT in 6 months.  He's been feeling well. He is retired since last time I saw him. He remains very active. He's not had any problems with his breathing. Weight has been stable.  Past Medical History  Diagnosis Date  . Hypertension   . Hypercholesterolemia   . Sinus disease   . Sleep apnea   . History of pneumonia   . History of degenerative joint disease     WITH KNEE PAIN  . Hx of asbestos exposure   . lung ca dx'd 207    Current Outpatient Prescriptions  Medication Sig Dispense Refill  . amitriptyline (ELAVIL) 100 MG tablet Take 100 mg by mouth at bedtime.      [name] aspirin 81 MG tablet Take 81 mg by mouth daily.        [name] diltiazem (CARDIZEM CD) 360 MG 24 hr capsule Take 360 mg by mouth daily.        [name] docusate sodium (COLACE) 100 MG capsule Take 100 mg by mouth 2 (two) times daily.      [name] levETIRAcetam (KEPPRA) 500 MG tablet Take 500 mg by mouth daily. 1000MG  AT BEDTIME AS WELL      . metoCLOPramide (REGLAN) 10 MG tablet Take 10 mg by mouth 4 (four) times daily.        . metoprolol succinate (TOPROL-XL) 25 MG 24 hr tablet Take 25 mg by mouth as needed.       . Multiple Vitamins-Minerals (CENTRUM SILVER PO) Take by mouth.      . naproxen sodium (ANAPROX) 550 MG tablet Take 550 mg by mouth as needed.       . nitroGLYCERIN (NITROSTAT) 0.4 MG SL tablet Place 0.4 mg under the tongue every 5 (five) minutes as needed.        . Omega-3 Fat Ac-Cholecalciferol (MINICAPS VITAMIN-D/OMEGA-3) [phone] MG-UNIT CAPS Take 1 tablet by mouth 1 day or 1 dose.       . psyllium (REGULOID) 0.52 G capsule Take 0.52 g by mouth daily.        .  rosuvastatin (CRESTOR) 5 MG tablet Take 5 mg by mouth daily.        . valsartan-hydrochlorothiazide (DIOVAN-HCT) 320-25 MG per tablet Take 1 tablet by mouth daily.         ROS Denies weight loss, fevers chills sweats Denies shortness of breath, wheezing, chest pain, hemoptysis  Physical Exam BP 125/81  Pulse 71  Resp 16  Ht 6' (1.829 m)  Wt 237 lb (107.502 kg)  BMI 32.14 kg/m2  SpO2 95% Gen. well-developed well-nourished 66 year old no acute distress Neck supple, no cervical or super the for adenopathy Lungs clear no real is or wheezing Incision is well-healed  Diagnostic Tests: CT chest 10/20/11 IMPRESSION:  1. Surgical changes within the right upper lobe. Apparent  enlargement of 3 right-sided lung nodules, primarily mixed ground-  glass and soft tissue attenuation. Findings are suspicious for  either metastatic disease (especially if the primary was  adenocarcinoma) or metachronous bronchogenic carcinoma or  carcinomas.  2. No evidence of thoracic nodal  metastatic disease.  3. Possible constipation.  4. Cholelithiasis.   Impression: 66 year old male with a history of a stage IA adenocarcinoma resected 6 years ago. He now has 3 small nodules in the right lung which are all in close proximity to each other and all have grown slightly over the course the past year. These do have a vaguely ground glass type of appearance. A PET CT done last year showed no evidence of hypermetabolic activity in the nodules.  I discussed with Mr. and Mrs. Shomaker our options. They understand the option of continued radiographic followup. We did discuss possible attempted bronchoscopic and/or CT-guided biopsy of the lesions. However given the location and size the field for bronchoscopic biopsy even with electromagnetic navigation will be low. Also there is a very high likelihood of a false negative or nondiagnostic biopsy if attempted with CT guidance..  Given the behavior of these lesions I think  it is highly unlikely that this represents metastatic disease. It also would be highly unusual to have 3 new synchronous primaries in that close proximity growing at almost exactly the same rate. They do understand we cannot rule out that one or more of these is cancerous. And they also understand that these do need to be followed.   Plan: I will see him back after the first of the year and we will review his CT scan from December at that time.

## 2011-12-22 DIAGNOSIS — I10 Essential (primary) hypertension: Secondary | ICD-10-CM | POA: Diagnosis not present

## 2011-12-22 DIAGNOSIS — J069 Acute upper respiratory infection, unspecified: Secondary | ICD-10-CM | POA: Diagnosis not present

## 2012-02-07 DIAGNOSIS — G43119 Migraine with aura, intractable, without status migrainosus: Secondary | ICD-10-CM | POA: Diagnosis not present

## 2012-02-07 DIAGNOSIS — G43019 Migraine without aura, intractable, without status migrainosus: Secondary | ICD-10-CM | POA: Diagnosis not present

## 2012-02-09 DIAGNOSIS — Z23 Encounter for immunization: Secondary | ICD-10-CM | POA: Diagnosis not present

## 2012-04-24 ENCOUNTER — Other Ambulatory Visit (HOSPITAL_BASED_OUTPATIENT_CLINIC_OR_DEPARTMENT_OTHER): Payer: Medicare Other | Admitting: Lab

## 2012-04-24 ENCOUNTER — Ambulatory Visit (HOSPITAL_COMMUNITY)
Admission: RE | Admit: 2012-04-24 | Discharge: 2012-04-24 | Disposition: A | Discharge status: Home / Self Care | Payer: Medicare Other | Source: Ambulatory Visit | Attending: Internal Medicine | Admitting: Internal Medicine

## 2012-04-24 DIAGNOSIS — R918 Other nonspecific abnormal finding of lung field: Secondary | ICD-10-CM | POA: Diagnosis not present

## 2012-04-24 DIAGNOSIS — C3410 Malignant neoplasm of upper lobe, unspecified bronchus or lung: Secondary | ICD-10-CM

## 2012-04-24 DIAGNOSIS — Z902 Acquired absence of lung [part of]: Secondary | ICD-10-CM | POA: Diagnosis not present

## 2012-04-24 DIAGNOSIS — K8020 Calculus of gallbladder without cholecystitis without obstruction: Secondary | ICD-10-CM | POA: Insufficient documentation

## 2012-04-24 DIAGNOSIS — C3490 Malignant neoplasm of unspecified part of unspecified bronchus or lung: Secondary | ICD-10-CM

## 2012-04-24 DIAGNOSIS — J984 Other disorders of lung: Secondary | ICD-10-CM | POA: Diagnosis not present

## 2012-04-24 DIAGNOSIS — Z85118 Personal history of other malignant neoplasm of bronchus and lung: Secondary | ICD-10-CM | POA: Diagnosis not present

## 2012-04-24 LAB — COMPREHENSIVE METABOLIC PANEL (CC13)
Albumin: 4.2 g/dL (ref 3.5–5.0)
BUN: 13 mg/dL (ref 7.0–26.0)
Calcium: 9.6 mg/dL (ref 8.4–10.4)
Chloride: 102 mEq/L (ref 98–107)
Glucose: 99 mg/dl (ref 70–99)
Potassium: 3.5 mEq/L (ref 3.5–5.1)

## 2012-04-24 LAB — CBC WITH DIFFERENTIAL/PLATELET
Basophils Absolute: 0 10*3/uL (ref 0.0–0.1)
EOS%: 3.8 % (ref 0.0–7.0)
Eosinophils Absolute: 0.2 10*3/uL (ref 0.0–0.5)
HGB: 14.3 g/dL (ref 13.0–17.1)
MONO#: 0.6 10*3/uL (ref 0.1–0.9)
NEUT#: 2.9 10*3/uL (ref 1.5–6.5)
RDW: 13.1 % (ref 11.0–14.6)
WBC: 6.4 10*3/uL (ref 4.0–10.3)
lymph#: 2.7 10*3/uL (ref 0.9–3.3)

## 2012-04-24 MED ORDER — IOHEXOL 300 MG/ML  SOLN
100.0000 mL | Freq: Once | INTRAMUSCULAR | Status: AC | PRN
Start: 2012-04-24 — End: 2012-04-24
  Administered 2012-04-24: 100 mL via INTRAVENOUS

## 2012-04-26 DIAGNOSIS — G43009 Migraine without aura, not intractable, without status migrainosus: Secondary | ICD-10-CM | POA: Diagnosis not present

## 2012-04-26 DIAGNOSIS — G4730 Sleep apnea, unspecified: Secondary | ICD-10-CM | POA: Diagnosis not present

## 2012-04-26 DIAGNOSIS — I10 Essential (primary) hypertension: Secondary | ICD-10-CM | POA: Diagnosis not present

## 2012-04-26 DIAGNOSIS — I6529 Occlusion and stenosis of unspecified carotid artery: Secondary | ICD-10-CM | POA: Diagnosis not present

## 2012-04-26 DIAGNOSIS — Z125 Encounter for screening for malignant neoplasm of prostate: Secondary | ICD-10-CM | POA: Diagnosis not present

## 2012-04-26 DIAGNOSIS — E7800 Pure hypercholesterolemia, unspecified: Secondary | ICD-10-CM | POA: Diagnosis not present

## 2012-04-26 DIAGNOSIS — N529 Male erectile dysfunction, unspecified: Secondary | ICD-10-CM | POA: Diagnosis not present

## 2012-04-26 DIAGNOSIS — Z0000 Encounter for general adult medical examination without abnormal findings: Secondary | ICD-10-CM | POA: Diagnosis not present

## 2012-04-26 DIAGNOSIS — E559 Vitamin D deficiency, unspecified: Secondary | ICD-10-CM | POA: Diagnosis not present

## 2012-04-26 DIAGNOSIS — Z79899 Other long term (current) drug therapy: Secondary | ICD-10-CM | POA: Diagnosis not present

## 2012-05-01 ENCOUNTER — Ambulatory Visit (HOSPITAL_BASED_OUTPATIENT_CLINIC_OR_DEPARTMENT_OTHER): Payer: Medicare Other | Admitting: Internal Medicine

## 2012-05-01 ENCOUNTER — Encounter: Payer: Self-pay | Admitting: Internal Medicine

## 2012-05-01 ENCOUNTER — Telehealth: Payer: Self-pay | Admitting: Internal Medicine

## 2012-05-01 VITALS — BP 151/95 | HR 75 | Temp 97.0°F | Resp 20 | Ht 72.0 in | Wt 246.1 lb

## 2012-05-01 DIAGNOSIS — C3410 Malignant neoplasm of upper lobe, unspecified bronchus or lung: Secondary | ICD-10-CM | POA: Diagnosis not present

## 2012-05-01 DIAGNOSIS — C3490 Malignant neoplasm of unspecified part of unspecified bronchus or lung: Secondary | ICD-10-CM | POA: Insufficient documentation

## 2012-05-01 NOTE — Telephone Encounter (Signed)
Gave pt appt for MD for July 2014 lab before CT then MD visit

## 2012-05-01 NOTE — Progress Notes (Signed)
[facility] Telephone:([phone]   Fax:(336) [phone]  OFFICE PROGRESS NOTE  Albertha Ghee, MD [facility] [address] [place] [address] [place] [address]  DIAGNOSIS: Stage IA (T1, N0, MX) non-small cell lung cancer, adenocarcinoma diagnosed in September of 2007   PRIOR THERAPY: Status post right upper lobectomy under the care of Dr. Dorris Fetch on 01/23/2006.   CURRENT THERAPY: Observation.  INTERVAL HISTORY: [name] 67 y.o. male returns to the clinic today for annual followup visit accompanied his wife. The patient is feeling fine today with no specific complaints. He denied having any significant chest pain, shortness breath, cough or hemoptysis. He denied having any significant weight loss or night sweats. The patient had repeat CT scan of the chest performed recently and he is here today for evaluation and discussion of his scan results.  MEDICAL HISTORY: Past Medical History  Diagnosis Date  . Hypertension   . Hypercholesterolemia   . Sinus disease   . Sleep apnea   . History of pneumonia   . History of degenerative joint disease     WITH KNEE PAIN  . Hx of asbestos exposure   . lung ca dx'd 207    ALLERGIES:  is allergic to lisinopril.  MEDICATIONS:  Current Outpatient Prescriptions  Medication Sig Dispense Refill  . amitriptyline (ELAVIL) 100 MG tablet Take 75 mg by mouth at bedtime.       [name] aspirin 81 MG tablet Take 81 mg by mouth daily.        [name] diltiazem (CARDIZEM CD) 360 MG 24 hr capsule Take 360 mg by mouth daily.        [name] docusate sodium (COLACE) 100 MG capsule Take 100 mg by mouth 2 (two) times daily.      [name] levETIRAcetam (KEPPRA) 500 MG tablet Take 500 mg by mouth daily. 1000MG  AT BEDTIME AS WELL      . metoCLOPramide (REGLAN) 10 MG tablet Take 10 mg by mouth 4 (four) times daily.        . metoprolol succinate (TOPROL-XL) 25 MG 24 hr tablet Take 25 mg by mouth as needed.       . Multiple  Vitamins-Minerals (CENTRUM SILVER PO) Take by mouth.      . naproxen sodium (ANAPROX) 550 MG tablet Take 550 mg by mouth as needed.       . nitroGLYCERIN (NITROSTAT) 0.4 MG SL tablet Place 0.4 mg under the tongue every 5 (five) minutes as needed.        . Omega-3 Fat Ac-Cholecalciferol (MINICAPS VITAMIN-D/OMEGA-3) [phone] MG-UNIT CAPS Take 1 tablet by mouth 1 day or 1 dose.       . psyllium (REGULOID) 0.52 G capsule Take 0.52 g by mouth daily.        . rosuvastatin (CRESTOR) 5 MG tablet Take 5 mg by mouth daily.        . valsartan-hydrochlorothiazide (DIOVAN-HCT) 320-25 MG per tablet Take 1 tablet by mouth daily.          SURGICAL HISTORY:  Past Surgical History  Procedure Date  . Cystourethroscopy, gyrus turp. 04/16/2010  . Torn medial and lateral menisci, left knee. 11/06/2006  . Right video-assisted thoracoscopy, right upper lobectomy and 01/23/2006  . The olympus video bronchoscope was introduced via the right 12/21/2005    REVIEW OF SYSTEMS:  A comprehensive review of systems was negative.   PHYSICAL EXAMINATION: General appearance: alert, cooperative and no distress Head: Normocephalic, without  obvious abnormality, atraumatic Neck: no adenopathy Lymph nodes: Cervical, supraclavicular, and axillary nodes normal. Resp: clear to auscultation bilaterally Cardio: regular rate and rhythm, S1, S2 normal, no murmur, click, rub or gallop GI: soft, non-tender; bowel sounds normal; no masses,  no organomegaly Extremities: extremities normal, atraumatic, no cyanosis or edema  ECOG PERFORMANCE STATUS: 0 - Asymptomatic  There were no vitals taken for this visit.  LABORATORY DATA: Lab Results  Component Value Date   WBC 6.4 04/24/2012   HGB 14.3 04/24/2012   HCT 41.5 04/24/2012   MCV 97.4 04/24/2012   PLT 182 04/24/2012      Chemistry      Component Value Date/Time   NA 139 04/24/2012 0853   NA 139 10/20/2011 0903   NA 139 06/25/2010 0758   NA 139 06/25/2010 0758   K 3.5 04/24/2012  0853   K 3.4 10/20/2011 0903   K 3.9 06/25/2010 0758   K 3.9 06/25/2010 0758   CL 102 04/24/2012 0853   CL 101 10/20/2011 0903   CL 103 06/25/2010 0758   CL 103 06/25/2010 0758   CO2 28 04/24/2012 0853   CO2 29 10/20/2011 0903   CO2 28 06/25/2010 0758   CO2 28 06/25/2010 0758   BUN 13.0 04/24/2012 0853   BUN 12 10/20/2011 0903   BUN 17 06/25/2010 0758   BUN 17 06/25/2010 0758   CREATININE 1.0 04/24/2012 0853   CREATININE 0.9 10/20/2011 0903   CREATININE 1.03 06/25/2010 0758   CREATININE 1.03 06/25/2010 0758      Component Value Date/Time   CALCIUM 9.6 04/24/2012 0853   CALCIUM 9.0 10/20/2011 0903   CALCIUM 9.7 06/25/2010 0758   CALCIUM 9.7 06/25/2010 0758   ALKPHOS 58 04/24/2012 0853   ALKPHOS 52 10/20/2011 0903   ALKPHOS 53 06/25/2010 0758   ALKPHOS 53 06/25/2010 0758   AST 23 04/24/2012 0853   AST 24 10/20/2011 0903   AST 22 06/25/2010 0758   AST 22 06/25/2010 0758   ALT 27 04/24/2012 0853   ALT 22 06/25/2010 0758   ALT 22 06/25/2010 0758   BILITOT 0.47 04/24/2012 0853   BILITOT 0.60 10/20/2011 0903   BILITOT 0.3 06/25/2010 0758   BILITOT 0.3 06/25/2010 0758       RADIOGRAPHIC STUDIES: Ct Chest W Contrast  04/24/2012  *RADIOLOGY REPORT*  Clinical Data: Lung cancer diagnosed in 2007 status post right upper lobectomy.  Reevaluate pulmonary nodules.  CT CHEST WITH CONTRAST  Technique:  Multidetector CT imaging of the chest was performed following the standard protocol during bolus administration of intravenous contrast.  Contrast: OMNIPAQUE IOHEXOL 300 MG/ML  SOLN  Comparison: Chest CT 10/20/2011.  Findings:  Mediastinum: Heart size is normal. There is no significant pericardial fluid, thickening or pericardial calcification.  Slight chronic shift of cardiomediastinal structures to the right (unchanged). No pathologically enlarged mediastinal or hilar lymph nodes. Esophagus is unremarkable in appearance.  Lungs/Pleura: Previously noted mixed subsolid and solid nodule in the anterolateral aspect of the  right lower lobe has increased in size, currently measuring 1.9 x 1.4 cm (image 20 of series 5), with a 9 mm central solid component (image 20 of series 2). The superior aspect of this nodule is also slightly larger, with a ground-glass attenuation component measuring 1.8 x 1.0 cm (image 17 of series 5), and a central solid component measuring 5 mm (image 17 of series 2).  9 mm ground-glass attenuation nodule in the periphery of the right lower lobe (image 23 of  series 5) appears similar inferiorly, however, superiorly the nodule appears slightly more bulky and more solid in appearance measuring up to 12 x 7 mm on image 22 of series 5.  A 6 mm the ground-glass attenuation nodule in the anterior aspect of the right lower lobe (image 25 of series 5) is unchanged. Postoperative changes of right upper lobectomy are again noted.  No definite new pulmonary nodules are noted.  No consolidative airspace disease.  No pleural effusions.  Upper Abdomen: Large calcified gallstone in the gallbladder.  No signs of acute cholecystitis at this time.  Musculoskeletal: There are no aggressive appearing lytic or blastic lesions noted in the visualized portions of the skeleton.  IMPRESSION: 1.  Interval enlargement of the two of the three right lower lobe subsolid nodules, as detailed above.  The largest of these nodules do have central solid components, raising concern for invasive adenocarcinoma.  Further evaluation with PET-CT and consideration for surgical consultation is recommended. 2.  Cholelithiasis without findings to suggest acute cholecystitis at this time.  These results were called by telephone on 04/24/2012 at 10:15 a.m. to Dr. Shirline Frees, who verbally acknowledged these results.   Original Report Authenticated By: Trudie Reed, M.D.     ASSESSMENT: This is a very pleasant 67 years old white male with history of stage IA non-small cell lung cancer status post right upper lobectomy and the patient continues to have  evidence for groundglass opacities in the right lung that has some evidence for mild disease progression based on the recent scan.  PLAN: I discussed the scan results and showed the images of the current and the previous scan in comparison to the patient and his wife. There was no clear evidence for disease progression that they can appreciate at this point. I gave the patient several options including doing a PET scan at this point to further evaluate this nodule versus continuous observation and repeat CT scan of the chest in 6 months. The patient would like to wait and do a CT scan of the chest in 6 months but he is also seeing Dr. Dorris Fetch next week for evaluation and he will discuss this option with him. He was advised to call me immediately if he has any concerning symptoms in the interval.  All questions were answered. The patient knows to call the clinic with any problems, questions or concerns. We can certainly see the patient much sooner if necessary.

## 2012-05-01 NOTE — Patient Instructions (Signed)
Your recent scan report showed some evidence for malignant disease progression. We discussed the scan results and the images together today. We discussed consideration of her PET scan versus repeat CT scan of the chest in 6 months. You elected to continue on observation for now with repeat CT scan in 6 months.

## 2012-05-08 ENCOUNTER — Ambulatory Visit (INDEPENDENT_AMBULATORY_CARE_PROVIDER_SITE_OTHER): Payer: Medicare Other | Admitting: Thoracic Surgery (Cardiothoracic Vascular Surgery)

## 2012-05-08 ENCOUNTER — Encounter: Payer: Self-pay | Admitting: Thoracic Surgery (Cardiothoracic Vascular Surgery)

## 2012-05-08 VITALS — BP 146/88 | HR 72 | Resp 20 | Ht 72.0 in | Wt 246.0 lb

## 2012-05-08 DIAGNOSIS — Z85118 Personal history of other malignant neoplasm of bronchus and lung: Secondary | ICD-10-CM

## 2012-05-08 DIAGNOSIS — R918 Other nonspecific abnormal finding of lung field: Secondary | ICD-10-CM | POA: Diagnosis not present

## 2012-05-08 NOTE — Progress Notes (Signed)
HPI:  [name] returns today for a followup visit regarding right lung nodules. He had a right upper lobectomy for cancer back in 2007. He was followed with CT scans. He had 3 small lung nodules that have been followed for some period of time. These were all the right lower lobe. He recently had a repeat CT and there is a suggestion that two of the nodules had increased in size.  He says that she's been feeling well. He denies any chest pain, shortness of breath, fever, chills or sweats. His exercise tolerance is good. His appetite is good. His weight is stable.  Past Medical History  Diagnosis Date  . Hypertension   . Hypercholesterolemia   . Sinus disease   . Sleep apnea   . History of pneumonia   . History of degenerative joint disease     WITH KNEE PAIN  . Hx of asbestos exposure   . lung ca dx'd 207   Current Outpatient Prescriptions on File Prior to Visit  Medication Sig Dispense Refill  . amitriptyline (ELAVIL) 100 MG tablet Take 75 mg by mouth at bedtime.       [name] aspirin 81 MG tablet Take 81 mg by mouth daily.        [name] diltiazem (CARDIZEM CD) 360 MG 24 hr capsule Take 360 mg by mouth daily.        [name] docusate sodium (COLACE) 100 MG capsule Take 100 mg by mouth 2 (two) times daily.      [name] levETIRAcetam (KEPPRA) 500 MG tablet Take 500 mg by mouth daily. 1000MG  AT BEDTIME AS WELL      . metoCLOPramide (REGLAN) 10 MG tablet Take 10 mg by mouth as needed.       . metoprolol succinate (TOPROL-XL) 25 MG 24 hr tablet Take 25 mg by mouth daily.       . Multiple Vitamins-Minerals (CENTRUM SILVER PO) Take by mouth daily.       . naproxen sodium (ANAPROX) 550 MG tablet Take 550 mg by mouth as needed.       . nitroGLYCERIN (NITROSTAT) 0.4 MG SL tablet Place 0.4 mg under the tongue every 5 (five) minutes as needed.        . Omega-3 Fat Ac-Cholecalciferol (MINICAPS VITAMIN-D/OMEGA-3) [phone] MG-UNIT CAPS Take 1 tablet by mouth 1 day or 1 dose.       . psyllium (REGULOID) 0.52 G capsule  Take 0.52 g by mouth daily.        . rosuvastatin (CRESTOR) 5 MG tablet Take 5 mg by mouth daily.        . valsartan-hydrochlorothiazide (DIOVAN-HCT) 320-25 MG per tablet Take 1 tablet by mouth daily.           Physical Exam BP 146/88  Pulse 72  Resp 20  Ht 6' (1.829 m)  Wt 246 lb (111.585 kg)  BMI 33.36 kg/m2  SpO2 97% General: 67 yo  male in no acute distress Neuro alert and oriented x3 with no deficits Cardiac regular rate and rhythm, normal S1 and S2 Lungs clear with equal breath sounds bilaterally No cervical, supraclavicular, or epitrochlear adenopathy   Diagnostic Tests:  CT CHEST 04/24/12  Clinical Data: Lung cancer diagnosed in 2007 status post right  upper lobectomy. Reevaluate pulmonary nodules.  CT CHEST WITH CONTRAST  Technique: Multidetector CT imaging of the chest was performed  following the standard protocol during bolus administration of  intravenous contrast.  Contrast: OMNIPAQUE IOHEXOL 300 MG/ML SOLN  Comparison:  Chest CT 10/20/2011.  Findings:  Mediastinum: Heart size is normal. There is no significant  pericardial fluid, thickening or pericardial calcification. Slight  chronic shift of cardiomediastinal structures to the right  (unchanged). No pathologically enlarged mediastinal or hilar lymph  nodes. Esophagus is unremarkable in appearance.  Lungs/Pleura: Previously noted mixed subsolid and solid nodule in  the anterolateral aspect of the right lower lobe has increased in  size, currently measuring 1.9 x 1.4 cm (image 20 of series 5), with  a 9 mm central solid component (image 20 of series 2). The superior  aspect of this nodule is also slightly larger, with a ground-glass  attenuation component measuring 1.8 x 1.0 cm (image 17 of series  5), and a central solid component measuring 5 mm (image 17 of  series 2). 9 mm ground-glass attenuation nodule in the periphery  of the right lower lobe (image 23 of series 5) appears similar  inferiorly,  however, superiorly the nodule appears slightly more  bulky and more solid in appearance measuring up to 12 x 7 mm on  image 22 of series 5. A 6 mm the ground-glass attenuation nodule  in the anterior aspect of the right lower lobe (image 25 of series  5) is unchanged. Postoperative changes of right upper lobectomy are  again noted. No definite new pulmonary nodules are noted. No  consolidative airspace disease. No pleural effusions.  Upper Abdomen: Large calcified gallstone in the gallbladder. No  signs of acute cholecystitis at this time.  Musculoskeletal: There are no aggressive appearing lytic or blastic  lesions noted in the visualized portions of the skeleton.  IMPRESSION:  1. Interval enlargement of the two of the three right lower lobe  subsolid nodules, as detailed above. The largest of these nodules  do have central solid components, raising concern for invasive  adenocarcinoma. Further evaluation with PET-CT and consideration  for surgical consultation is recommended.  2. Cholelithiasis without findings to suggest acute cholecystitis  at this time.  These results were called by telephone on 04/24/2012 at 10:15 a.m.  to Dr. Shirline Frees, who verbally acknowledged these results.   Impression: [name] is a 67 year old gentleman who had a right upper lobectomy in 2007 for lung cancer. He's been followed with CT scans since that time. He has had a 3 nodules in the right lower lobe that had been followed in the been present for quite some time. There is suggestion on his most recent CT that these may have increased in size from July to January. Careful examination of the films shows that the nodules although very similar to her previous appearance and if there has been any growth, it is only a very small amount. I think accessing these percutaneously would be difficult, as would trying to access them bronchoscopically.  My recommendation to him was that we continue to follow these  radiographically. He has a CT scheduled in July and I will plan to see him back after that  Plan: Return in 6 months after CT scan of chest

## 2012-06-06 DIAGNOSIS — H2510 Age-related nuclear cataract, unspecified eye: Secondary | ICD-10-CM | POA: Diagnosis not present

## 2012-06-06 DIAGNOSIS — H52209 Unspecified astigmatism, unspecified eye: Secondary | ICD-10-CM | POA: Diagnosis not present

## 2012-07-30 DIAGNOSIS — E039 Hypothyroidism, unspecified: Secondary | ICD-10-CM | POA: Diagnosis not present

## 2012-08-14 DIAGNOSIS — G43019 Migraine without aura, intractable, without status migrainosus: Secondary | ICD-10-CM | POA: Diagnosis not present

## 2012-08-14 DIAGNOSIS — G43119 Migraine with aura, intractable, without status migrainosus: Secondary | ICD-10-CM | POA: Diagnosis not present

## 2012-10-16 ENCOUNTER — Telehealth: Payer: Self-pay | Admitting: Internal Medicine

## 2012-10-16 NOTE — Telephone Encounter (Signed)
s.w. pt and advised on appt d/t change dut to MD on pal...pt ok and aware

## 2012-10-22 ENCOUNTER — Other Ambulatory Visit: Payer: Medicare Other

## 2012-10-23 ENCOUNTER — Encounter (HOSPITAL_COMMUNITY): Payer: Self-pay

## 2012-10-23 ENCOUNTER — Other Ambulatory Visit (HOSPITAL_BASED_OUTPATIENT_CLINIC_OR_DEPARTMENT_OTHER): Payer: Medicare Other | Admitting: Lab

## 2012-10-23 ENCOUNTER — Ambulatory Visit (HOSPITAL_COMMUNITY)
Admission: RE | Admit: 2012-10-23 | Discharge: 2012-10-23 | Disposition: A | Discharge status: Home / Self Care | Payer: Medicare Other | Source: Ambulatory Visit | Attending: Internal Medicine | Admitting: Internal Medicine

## 2012-10-23 DIAGNOSIS — Z85118 Personal history of other malignant neoplasm of bronchus and lung: Secondary | ICD-10-CM | POA: Diagnosis not present

## 2012-10-23 DIAGNOSIS — R918 Other nonspecific abnormal finding of lung field: Secondary | ICD-10-CM | POA: Insufficient documentation

## 2012-10-23 DIAGNOSIS — Z77090 Contact with and (suspected) exposure to asbestos: Secondary | ICD-10-CM | POA: Diagnosis not present

## 2012-10-23 DIAGNOSIS — C3490 Malignant neoplasm of unspecified part of unspecified bronchus or lung: Secondary | ICD-10-CM | POA: Diagnosis not present

## 2012-10-23 DIAGNOSIS — K8020 Calculus of gallbladder without cholecystitis without obstruction: Secondary | ICD-10-CM | POA: Insufficient documentation

## 2012-10-23 DIAGNOSIS — J984 Other disorders of lung: Secondary | ICD-10-CM | POA: Diagnosis not present

## 2012-10-23 LAB — COMPREHENSIVE METABOLIC PANEL (CC13)
ALT: 25 U/L (ref 0–55)
AST: 21 U/L (ref 5–34)
Albumin: 3.8 g/dL (ref 3.5–5.0)
Alkaline Phosphatase: 53 U/L (ref 40–150)
BUN: 12.5 mg/dL (ref 7.0–26.0)
CO2: 27 meq/L (ref 22–29)
Calcium: 9.2 mg/dL (ref 8.4–10.4)
Chloride: 100 meq/L (ref 98–109)
Creatinine: 0.9 mg/dL (ref 0.7–1.3)
Glucose: 103 mg/dL (ref 70–140)
Potassium: 3.6 meq/L (ref 3.5–5.1)
Sodium: 137 meq/L (ref 136–145)
Total Bilirubin: 0.41 mg/dL (ref 0.20–1.20)
Total Protein: 6.3 g/dL — ABNORMAL LOW (ref 6.4–8.3)

## 2012-10-23 LAB — CBC WITH DIFFERENTIAL/PLATELET
BASO%: 0.7 % (ref 0.0–2.0)
Basophils Absolute: 0 10e3/uL (ref 0.0–0.1)
EOS%: 3 % (ref 0.0–7.0)
Eosinophils Absolute: 0.2 10e3/uL (ref 0.0–0.5)
HCT: 39 % (ref 38.4–49.9)
HGB: 13.7 g/dL (ref 13.0–17.1)
LYMPH%: 44.7 % (ref 14.0–49.0)
MCH: 34.2 pg — ABNORMAL HIGH (ref 27.2–33.4)
MCHC: 35.2 g/dL (ref 32.0–36.0)
MCV: 97.1 fL (ref 79.3–98.0)
MONO#: 0.5 10e3/uL (ref 0.1–0.9)
MONO%: 9.2 % (ref 0.0–14.0)
NEUT#: 2.5 10e3/uL (ref 1.5–6.5)
NEUT%: 42.4 % (ref 39.0–75.0)
Platelets: 189 10e3/uL (ref 140–400)
RBC: 4.02 10e6/uL — ABNORMAL LOW (ref 4.20–5.82)
RDW: 12.8 % (ref 11.0–14.6)
WBC: 6 10e3/uL (ref 4.0–10.3)
lymph#: 2.7 10e3/uL (ref 0.9–3.3)

## 2012-10-23 MED ORDER — IOHEXOL 300 MG/ML  SOLN
80.0000 mL | Freq: Once | INTRAMUSCULAR | Status: AC | PRN
  Administered 2012-10-23: 80 mL via INTRAVENOUS

## 2012-10-25 ENCOUNTER — Telehealth: Payer: Self-pay | Admitting: Internal Medicine

## 2012-10-25 ENCOUNTER — Ambulatory Visit (HOSPITAL_BASED_OUTPATIENT_CLINIC_OR_DEPARTMENT_OTHER): Payer: Medicare Other | Admitting: Internal Medicine

## 2012-10-25 DIAGNOSIS — C3490 Malignant neoplasm of unspecified part of unspecified bronchus or lung: Secondary | ICD-10-CM | POA: Diagnosis not present

## 2012-10-25 NOTE — Telephone Encounter (Signed)
gv and printed appt sched and avs for pt  °

## 2012-10-25 NOTE — Progress Notes (Signed)
[facility] [phone]   [phone]  OFFICE PROGRESS NOTE  Albertha Ghee, MD [facility] [address] [place] [address] [place] [address]  DIAGNOSIS: Stage IA (T1, N0, MX) non-small cell lung cancer, adenocarcinoma diagnosed in September of 2007   PRIOR THERAPY: Status post right upper lobectomy under the care of Dr. Dorris Fetch on 01/23/2006.   CURRENT THERAPY: Observation.  DISEASE STAGE: Stage IA (T1, N0, MX) non-small cell lung cancer, adenocarcinoma diagnosed in September of 2007   CHEMOTHERAPY INTENT: Curative.  CURRENT # OF CHEMOTHERAPY CYCLES: 0  CURRENT ANTIEMETICS: None  CURRENT SMOKING STATUS: Non-smoker  ORAL CHEMOTHERAPY AND CONSENT: None  CURRENT BISPHOSPHONATES USE: None  LIVING WILL AND CODE STATUS: Full code.  INTERVAL HISTORY: [name] 67 y.o. male returns to the clinic today for six-month followup visit accompanied by his wife. The patient is feeling fine today with no specific complaints except for right arm pressure under the arm with exertion increased since last visit. The patient also has some tingling and numbness in the right fingers. He denied having any significant chest pain, shortness breath, cough or hemoptysis. The patient denied having any weight loss or night sweats. He has no fever or chills, nausea or vomiting. He had repeat CT scan of the chest performed recently and he is here for evaluation and discussion of his scan results.  MEDICAL HISTORY: Past Medical History  Diagnosis Date  . Hypertension   . Hypercholesterolemia   . Sinus disease   . Sleep apnea   . History of pneumonia   . History of degenerative joint disease     WITH KNEE PAIN  . Hx of asbestos exposure   . lung ca dx'd 207    ALLERGIES:  is allergic to lisinopril.  MEDICATIONS:  Current Outpatient Prescriptions  Medication Sig Dispense Refill  . amitriptyline (ELAVIL) 100 MG tablet  Take 75 mg by mouth at bedtime.       [name] aspirin 81 MG tablet Take 81 mg by mouth daily.        [name] diltiazem (CARDIZEM CD) 360 MG 24 hr capsule Take 360 mg by mouth daily.        [name] docusate sodium (COLACE) 100 MG capsule Take 100 mg by mouth 2 (two) times daily.      [name] levETIRAcetam (KEPPRA) 500 MG tablet Take 500 mg by mouth daily. 1000MG  AT BEDTIME AS WELL      . metoCLOPramide (REGLAN) 10 MG tablet Take 10 mg by mouth as needed.       . metoprolol succinate (TOPROL-XL) 25 MG 24 hr tablet Take 25 mg by mouth daily.       . Multiple Vitamins-Minerals (CENTRUM SILVER PO) Take by mouth daily.       . naproxen sodium (ANAPROX) 550 MG tablet Take 550 mg by mouth as needed.       . nitroGLYCERIN (NITROSTAT) 0.4 MG SL tablet Place 0.4 mg under the tongue every 5 (five) minutes as needed.        . Omega-3 Fat Ac-Cholecalciferol (MINICAPS VITAMIN-D/OMEGA-3) [phone] MG-UNIT CAPS Take 1 tablet by mouth 1 day or 1 dose.       . psyllium (REGULOID) 0.52 G capsule Take 0.52 g by mouth daily.        . rosuvastatin (CRESTOR) 5 MG tablet Take 5 mg by mouth daily.        . valsartan-hydrochlorothiazide (DIOVAN-HCT) 320-25 MG per tablet  Take 1 tablet by mouth daily.         No current facility-administered medications for this visit.    SURGICAL HISTORY:  Past Surgical History  Procedure Laterality Date  . Cystourethroscopy, gyrus turp.  04/16/2010  . Torn medial and lateral menisci, left knee.  11/06/2006  . Right video-assisted thoracoscopy, right upper lobectomy and  01/23/2006  . The olympus video bronchoscope was introduced via the right  12/21/2005    REVIEW OF SYSTEMS:  A comprehensive review of systems was negative except for: Constitutional: positive for fatigue   PHYSICAL EXAMINATION: General appearance: alert, cooperative and no distress Head: Normocephalic, without obvious abnormality, atraumatic Neck: no adenopathy Lymph nodes: Cervical, supraclavicular, and axillary nodes normal. Resp:  clear to auscultation bilaterally Cardio: regular rate and rhythm, S1, S2 normal, no murmur, click, rub or gallop GI: soft, non-tender; bowel sounds normal; no masses,  no organomegaly Extremities: extremities normal, atraumatic, no cyanosis or edema  ECOG PERFORMANCE STATUS: 1 - Symptomatic but completely ambulatory  Blood pressure 139/85, pulse 77, temperature 97 F (36.1 C), temperature source Oral, resp. rate 18, height 6\' 1"  (1.854 m), weight 242 lb 9.6 oz (110.043 kg).  LABORATORY DATA: Lab Results  Component Value Date   WBC 6.0 10/23/2012   HGB 13.7 10/23/2012   HCT 39.0 10/23/2012   MCV 97.1 10/23/2012   PLT 189 10/23/2012      Chemistry      Component Value Date/Time   NA 137 10/23/2012 0818   NA 139 10/20/2011 0903   NA 139 06/25/2010 0758   K 3.6 10/23/2012 0818   K 3.4 10/20/2011 0903   K 3.9 06/25/2010 0758   CL 102 04/24/2012 0853   CL 101 10/20/2011 0903   CL 103 06/25/2010 0758   CO2 27 10/23/2012 0818   CO2 29 10/20/2011 0903   CO2 28 06/25/2010 0758   BUN 12.5 10/23/2012 0818   BUN 12 10/20/2011 0903   BUN 17 06/25/2010 0758   CREATININE 0.9 10/23/2012 0818   CREATININE 0.9 10/20/2011 0903   CREATININE 1.03 06/25/2010 0758      Component Value Date/Time   CALCIUM 9.2 10/23/2012 0818   CALCIUM 9.0 10/20/2011 0903   CALCIUM 9.7 06/25/2010 0758   ALKPHOS 53 10/23/2012 0818   ALKPHOS 52 10/20/2011 0903   ALKPHOS 53 06/25/2010 0758   AST 21 10/23/2012 0818   AST 24 10/20/2011 0903   AST 22 06/25/2010 0758   ALT 25 10/23/2012 0818   ALT 22 06/25/2010 0758   BILITOT 0.41 10/23/2012 0818   BILITOT 0.60 10/20/2011 0903   BILITOT 0.3 06/25/2010 0758       RADIOGRAPHIC STUDIES: Ct Chest W Contrast  10/23/2012   *RADIOLOGY REPORT*  Clinical Data: Follow-up lung carcinoma.  Asbestos exposure. Status post right upper lobectomy.  CT CHEST WITH CONTRAST  Technique:  Multidetector CT imaging of the chest was performed following the standard protocol during bolus administration of  intravenous contrast.  Contrast: 80mL OMNIPAQUE IOHEXOL 300 MG/ML  SOLN  Comparison: 04/24/2012  Findings: Postop changes from right upper lobectomy again demonstrated.  Two adjacent ill-defined pulmonary nodules in the right upper lung field remains stable, measuring 12 x 19 mm on image 21 and 7 x 12 mm on image 23.  A third 6 mm nodule in the anterior right midlung field on image 26 is also unchanged.  No new or enlarging pulmonary nodules or masses are identified.  No evidence of acute infiltrate or central endobronchial lesion.  No evidence of hilar mediastinal masses.  No adenopathy seen elsewhere within the thorax.  No evidence of pleural or pericardial effusion.  No evidence of chest wall mass or suspicious bone lesions.  Both adrenal glands are normal in appearance. Cholelithiasis is again demonstrated.  IMPRESSION:  1.  No significant interval change in right lung nodules from prior exam. 2.  No new or progressive disease within the thorax. 3.  Cholelithiasis.   Original Report Authenticated By: Myles Rosenthal, M.D.    ASSESSMENT AND PLAN: This is a very pleasant 67 years old white male with stage IA non-small cell lung cancer status post right upper lobectomy and has been observation since October of 2007 was no evidence for disease recurrence. I discussed the scan results with the patient and his wife. I recommended for him to continue on observation with repeat CT scan of the chest in 6 months. Regarding the right arm pain, this could be related to scarring from his previous surgery.  I advised patient to followup with his primary care physician for further evaluation of persistent. Patient was advised to call immediately if he has any other concerning symptoms in the interval.  All questions were answered. The patient knows to call the clinic with any problems, questions or concerns. We can certainly see the patient much sooner if necessary.

## 2012-10-27 ENCOUNTER — Encounter: Payer: Self-pay | Admitting: Internal Medicine

## 2012-10-27 NOTE — Patient Instructions (Signed)
DISEASE STAGE: Stage IA (T1, N0, MX) non-small cell lung cancer, adenocarcinoma diagnosed in September of 2007   CHEMOTHERAPY INTENT: Curative.  CURRENT # OF CHEMOTHERAPY CYCLES: 0  CURRENT ANTIEMETICS: None  CURRENT SMOKING STATUS: Non-smoker  ORAL CHEMOTHERAPY AND CONSENT: None  CURRENT BISPHOSPHONATES USE: None  LIVING WILL AND CODE STATUS: Full code.  No evidence for disease recurrence on the recent scan.  Followup visit in 6 months with repeat CT scan of the chest.

## 2012-10-29 ENCOUNTER — Ambulatory Visit: Payer: Medicare Other | Admitting: Internal Medicine

## 2012-10-29 ENCOUNTER — Other Ambulatory Visit (HOSPITAL_COMMUNITY): Payer: Medicare Other

## 2012-11-06 ENCOUNTER — Ambulatory Visit (INDEPENDENT_AMBULATORY_CARE_PROVIDER_SITE_OTHER): Payer: Medicare Other | Admitting: Thoracic Surgery (Cardiothoracic Vascular Surgery)

## 2012-11-06 ENCOUNTER — Encounter: Payer: Self-pay | Admitting: Thoracic Surgery (Cardiothoracic Vascular Surgery)

## 2012-11-06 ENCOUNTER — Ambulatory Visit: Payer: Medicare Other | Admitting: Thoracic Surgery (Cardiothoracic Vascular Surgery)

## 2012-11-06 VITALS — BP 142/88 | HR 68 | Resp 16 | Ht 72.0 in | Wt 241.0 lb

## 2012-11-06 DIAGNOSIS — Z9889 Other specified postprocedural states: Secondary | ICD-10-CM | POA: Diagnosis not present

## 2012-11-06 DIAGNOSIS — Z85118 Personal history of other malignant neoplasm of bronchus and lung: Secondary | ICD-10-CM

## 2012-11-06 DIAGNOSIS — R918 Other nonspecific abnormal finding of lung field: Secondary | ICD-10-CM

## 2012-11-06 DIAGNOSIS — Z902 Acquired absence of lung [part of]: Secondary | ICD-10-CM

## 2012-11-06 NOTE — Progress Notes (Signed)
HPI:  [name] returns today for a followup visit regarding right lung nodules. He had a right upper lobectomy for cancer back in 2007. He was followed with CT scans. He had 3 small lung nodules that have been followed for some period of time. These were all the right lower lobe. Earlier this year he had a repeat CT and there was a suggestion that two of the nodules had increased in size. On careful reviewed the nodules appeared to be minimally if at all enlarged and we recommended continued radiographic followup   He recently had a repeat CT. He saw Dr. Arbutus Ped who felt that he should continue to be followed with CT scans.  [name] says he feels well. He's not having any chest pain or shortness of breath. His weight is stable. He remains active.  Past Medical History  Diagnosis Date  . Hypertension   . Hypercholesterolemia   . Sinus disease   . Sleep apnea   . History of pneumonia   . History of degenerative joint disease     WITH KNEE PAIN  . Hx of asbestos exposure   . lung ca dx'd 207       Current Outpatient Prescriptions  Medication Sig Dispense Refill  . amitriptyline (ELAVIL) 75 MG tablet Take by mouth at bedtime.      [name] aspirin 81 MG tablet Take 81 mg by mouth daily.        [name] diltiazem (CARDIZEM CD) 360 MG 24 hr capsule Take 360 mg by mouth daily.        [name] docusate sodium (COLACE) 100 MG capsule Take 100 mg by mouth 2 (two) times daily.      [name] levETIRAcetam (KEPPRA) 500 MG tablet Take 500 mg by mouth daily.       . metoCLOPramide (REGLAN) 10 MG tablet Take 10 mg by mouth as needed.       . metoprolol succinate (TOPROL-XL) 25 MG 24 hr tablet Take 25 mg by mouth daily.       . Multiple Vitamins-Minerals (CENTRUM SILVER PO) Take by mouth daily.       . naproxen sodium (ANAPROX) 550 MG tablet Take 550 mg by mouth as needed.       . nitroGLYCERIN (NITROSTAT) 0.4 MG SL tablet Place 0.4 mg under the tongue every 5 (five) minutes as needed.        . Omega-3 Fat  Ac-Cholecalciferol (MINICAPS VITAMIN-D/OMEGA-3) [phone] MG-UNIT CAPS Take 1 tablet by mouth 1 day or 1 dose.       . psyllium (REGULOID) 0.52 G capsule Take 0.52 g by mouth daily.        . rosuvastatin (CRESTOR) 5 MG tablet Take 5 mg by mouth daily.        . valsartan-hydrochlorothiazide (DIOVAN-HCT) 320-25 MG per tablet Take 1 tablet by mouth daily.        [name] TAZTIA XT 360 MG 24 hr capsule        No current facility-administered medications for this visit.    Physical Exam BP 142/88  Pulse 68  Resp 16  Ht 6' (1.829 m)  Wt 241 lb (109.317 kg)  BMI 32.68 kg/m2  SpO24 52% 67 year old male in no acute distress No cervical or supraclavicular adenopathy Lungs clear with equal breath sounds bilaterally Cardiac regular rate and rhythm  Diagnostic Tests: CT of chest 10/23/12 *RADIOLOGY REPORT*  Clinical Data: Follow-up lung carcinoma. Asbestos exposure.  Status post right upper lobectomy.  CT CHEST WITH CONTRAST  Technique: Multidetector CT imaging of the chest was performed  following the standard protocol during bolus administration of  intravenous contrast.  Contrast: 80mL OMNIPAQUE IOHEXOL 300 MG/ML SOLN  Comparison: 04/24/2012  Findings: Postop changes from right upper lobectomy again  demonstrated. Two adjacent ill-defined pulmonary nodules in the  right upper lung field remains stable, measuring 12 x 19 mm on  image 21 and 7 x 12 mm on image 23. A third 6 mm nodule in the  anterior right midlung field on image 26 is also unchanged. No new  or enlarging pulmonary nodules or masses are identified. No  evidence of acute infiltrate or central endobronchial lesion.  No evidence of hilar mediastinal masses. No adenopathy seen  elsewhere within the thorax. No evidence of pleural or pericardial  effusion. No evidence of chest wall mass or suspicious bone  lesions. Both adrenal glands are normal in appearance.  Cholelithiasis is again demonstrated.  IMPRESSION:  1. No significant  interval change in right lung nodules from prior  exam.  2. No new or progressive disease within the thorax.  3. Cholelithiasis.  Original Report Authenticated By: Myles Rosenthal, M.D.   Impression: 67 year old gentleman with a remote history of lung cancer who has 3 nodules in the right lower lobe. These are of varying sizes. There has been no change in them over the past 6 months. I agree with Dr. Asa Lente recommendation to continue radiographic followup.  He will see Dr. Arbutus Ped in 6 months with a repeat CT.   Plan: I will plan to see him back in one year with a repeat CT at that time.  Certainly there is any question of change on the CT in January I would be happy to see him earlier

## 2012-11-07 ENCOUNTER — Other Ambulatory Visit: Payer: Self-pay

## 2012-12-28 DIAGNOSIS — Z23 Encounter for immunization: Secondary | ICD-10-CM | POA: Diagnosis not present

## 2013-02-05 DIAGNOSIS — G43119 Migraine with aura, intractable, without status migrainosus: Secondary | ICD-10-CM | POA: Diagnosis not present

## 2013-02-05 DIAGNOSIS — G43019 Migraine without aura, intractable, without status migrainosus: Secondary | ICD-10-CM | POA: Diagnosis not present

## 2013-02-07 ENCOUNTER — Other Ambulatory Visit: Payer: Self-pay

## 2013-02-11 ENCOUNTER — Other Ambulatory Visit: Payer: Self-pay | Admitting: Gastroenterology

## 2013-02-11 DIAGNOSIS — E039 Hypothyroidism, unspecified: Secondary | ICD-10-CM | POA: Diagnosis not present

## 2013-03-04 ENCOUNTER — Encounter (HOSPITAL_COMMUNITY): Payer: Self-pay | Admitting: Pharmacy Technician

## 2013-03-04 ENCOUNTER — Encounter (HOSPITAL_COMMUNITY): Payer: Self-pay | Admitting: *Deleted

## 2013-03-18 ENCOUNTER — Encounter (HOSPITAL_COMMUNITY): Payer: Self-pay | Admitting: Anesthesiology

## 2013-03-18 NOTE — Anesthesia Preprocedure Evaluation (Signed)
Anesthesia Evaluation  Patient identified by MRN, date of birth, ID band Patient awake    Reviewed: Allergy & Precautions, H&P , NPO status , Patient's Chart, lab work & pertinent test results  History of Anesthesia Complications (+) history of anesthetic complications  Airway Mallampati: II TM Distance: >3 FB Neck ROM: Full    Dental no notable dental hx.    Pulmonary sleep apnea ,  breath sounds clear to auscultation  Pulmonary exam normal       Cardiovascular hypertension, Pt. on medications and Pt. on home beta blockers Rhythm:Regular Rate:Normal     Neuro/Psych negative neurological ROS  negative psych ROS   GI/Hepatic negative GI ROS, Neg liver ROS,   Endo/Other  Hypothyroidism   Renal/GU negative Renal ROS  negative genitourinary   Musculoskeletal negative musculoskeletal ROS (+)   Abdominal (+) + obese,   Peds negative pediatric ROS (+)  Hematology negative hematology ROS (+)   Anesthesia Other Findings   Reproductive/Obstetrics negative OB ROS                           Anesthesia Physical Anesthesia Plan  ASA: II  Anesthesia Plan: MAC   Post-op Pain Management:    Induction: Intravenous  Airway Management Planned:   Additional Equipment:   Intra-op Plan:   Post-operative Plan:   Informed Consent: I have reviewed the patients History and Physical, chart, labs and discussed the procedure including the risks, benefits and alternatives for the proposed anesthesia with the patient or authorized representative who has indicated his/her understanding and acceptance.   Dental advisory given  Plan Discussed with: CRNA  Anesthesia Plan Comments:         Anesthesia Quick Evaluation

## 2013-03-19 ENCOUNTER — Encounter (HOSPITAL_COMMUNITY)
Admission: RE | Disposition: A | Discharge status: Home / Self Care | Payer: Self-pay | Source: Ambulatory Visit | Attending: Gastroenterology

## 2013-03-19 ENCOUNTER — Ambulatory Visit (HOSPITAL_COMMUNITY): Payer: Medicare Other | Admitting: Anesthesiology

## 2013-03-19 ENCOUNTER — Encounter (HOSPITAL_COMMUNITY): Payer: Medicare Other | Admitting: Anesthesiology

## 2013-03-19 ENCOUNTER — Ambulatory Visit (HOSPITAL_COMMUNITY)
Admission: RE | Admit: 2013-03-19 | Discharge: 2013-03-19 | Disposition: A | Discharge status: Home / Self Care | Payer: Medicare Other | Source: Ambulatory Visit | Attending: Gastroenterology | Admitting: Gastroenterology

## 2013-03-19 ENCOUNTER — Encounter (HOSPITAL_COMMUNITY): Payer: Self-pay | Admitting: *Deleted

## 2013-03-19 DIAGNOSIS — K219 Gastro-esophageal reflux disease without esophagitis: Secondary | ICD-10-CM | POA: Diagnosis not present

## 2013-03-19 DIAGNOSIS — D126 Benign neoplasm of colon, unspecified: Secondary | ICD-10-CM | POA: Diagnosis not present

## 2013-03-19 DIAGNOSIS — I10 Essential (primary) hypertension: Secondary | ICD-10-CM | POA: Insufficient documentation

## 2013-03-19 DIAGNOSIS — Z85118 Personal history of other malignant neoplasm of bronchus and lung: Secondary | ICD-10-CM | POA: Diagnosis not present

## 2013-03-19 DIAGNOSIS — Z1211 Encounter for screening for malignant neoplasm of colon: Secondary | ICD-10-CM | POA: Insufficient documentation

## 2013-03-19 DIAGNOSIS — E7800 Pure hypercholesterolemia, unspecified: Secondary | ICD-10-CM | POA: Diagnosis not present

## 2013-03-19 DIAGNOSIS — Z86010 Personal history of colonic polyps: Secondary | ICD-10-CM | POA: Diagnosis not present

## 2013-03-19 DIAGNOSIS — G4733 Obstructive sleep apnea (adult) (pediatric): Secondary | ICD-10-CM | POA: Diagnosis not present

## 2013-03-19 HISTORY — PX: COLONOSCOPY WITH PROPOFOL: SHX5780

## 2013-03-19 HISTORY — DX: Adverse effect of unspecified anesthetic, initial encounter: T41.45XA

## 2013-03-19 HISTORY — DX: Contact with and (suspected) exposure to other hazardous, chiefly nonmedicinal, chemicals: Z77.098

## 2013-03-19 HISTORY — DX: Hypothyroidism, unspecified: E03.9

## 2013-03-19 SURGERY — COLONOSCOPY WITH PROPOFOL
Anesthesia: Monitor Anesthesia Care

## 2013-03-19 MED ORDER — PROPOFOL INFUSION 10 MG/ML OPTIME
INTRAVENOUS | Status: DC | PRN
  Administered 2013-03-19: 100 ug/kg/min via INTRAVENOUS

## 2013-03-19 MED ORDER — LACTATED RINGERS IV SOLN
INTRAVENOUS | Status: DC | PRN
  Administered 2013-03-19: 07:00:00 via INTRAVENOUS

## 2013-03-19 MED ORDER — SODIUM CHLORIDE 0.9 % IV SOLN: INTRAVENOUS | Status: DC

## 2013-03-19 MED ORDER — LACTATED RINGERS IV SOLN
INTRAVENOUS | Status: DC
  Administered 2013-03-19: 1000 mL via INTRAVENOUS

## 2013-03-19 MED ORDER — FENTANYL CITRATE 0.05 MG/ML IJ SOLN
INTRAMUSCULAR | Status: AC
  Filled 2013-03-19: qty 2

## 2013-03-19 MED ORDER — KETAMINE HCL 10 MG/ML IJ SOLN
INTRAMUSCULAR | Status: DC | PRN
  Administered 2013-03-19: 25 mg via INTRAVENOUS

## 2013-03-19 MED ORDER — ONDANSETRON HCL 4 MG/2ML IJ SOLN
INTRAMUSCULAR | Status: DC | PRN
  Administered 2013-03-19: 4 mg via INTRAVENOUS
  Administered 2013-03-19: 6 mg via INTRAVENOUS

## 2013-03-19 MED ORDER — SODIUM CHLORIDE 0.9 % IJ SOLN
INTRAMUSCULAR | Status: AC
  Filled 2013-03-19: qty 10

## 2013-03-19 MED ORDER — FENTANYL CITRATE 0.05 MG/ML IJ SOLN
INTRAMUSCULAR | Status: DC | PRN
  Administered 2013-03-19 (×2): 25 ug via INTRAVENOUS
  Administered 2013-03-19: 50 ug via INTRAVENOUS

## 2013-03-19 MED ORDER — PROPOFOL 10 MG/ML IV BOLUS
INTRAVENOUS | Status: AC
  Filled 2013-03-19: qty 20

## 2013-03-19 MED ORDER — EPHEDRINE SULFATE 50 MG/ML IJ SOLN
INTRAMUSCULAR | Status: AC
  Filled 2013-03-19: qty 1

## 2013-03-19 MED ORDER — GLYCOPYRROLATE 0.2 MG/ML IJ SOLN
INTRAMUSCULAR | Status: AC
  Filled 2013-03-19: qty 1

## 2013-03-19 MED ORDER — MIDAZOLAM HCL 5 MG/5ML IJ SOLN
INTRAMUSCULAR | Status: DC | PRN
  Administered 2013-03-19 (×2): 0.5 mg via INTRAVENOUS
  Administered 2013-03-19: 1 mg via INTRAVENOUS

## 2013-03-19 MED ORDER — ONDANSETRON HCL 4 MG/2ML IJ SOLN
INTRAMUSCULAR | Status: AC
  Filled 2013-03-19: qty 2

## 2013-03-19 MED ORDER — MIDAZOLAM HCL 2 MG/2ML IJ SOLN
INTRAMUSCULAR | Status: AC
  Filled 2013-03-19: qty 2

## 2013-03-19 MED ORDER — LIDOCAINE HCL (CARDIAC) 20 MG/ML IV SOLN
INTRAVENOUS | Status: AC
  Filled 2013-03-19: qty 5

## 2013-03-19 MED ORDER — KETAMINE HCL 50 MG/ML IJ SOLN
INTRAMUSCULAR | Status: AC
  Filled 2013-03-19: qty 10

## 2013-03-19 SURGICAL SUPPLY — 22 items

## 2013-03-19 NOTE — Transfer of Care (Signed)
Immediate Anesthesia Transfer of Care Note  Patient: [name]  Procedure(s) Performed: Procedure(s): COLONOSCOPY WITH PROPOFOL (N/A)  Patient Location: PACU  Anesthesia Type:MAC  Level of Consciousness: awake, alert , oriented and sedated  Airway & Oxygen Therapy: Patient Spontanous Breathing and Patient connected to nasal cannula oxygen  Post-op Assessment: Report given to PACU RN and Post -op Vital signs reviewed and stable  Post vital signs: stable  Complications: No apparent anesthesia complications

## 2013-03-19 NOTE — Op Note (Signed)
Procedure: Surveillance colonoscopy. Personal history of adenomatous colon polyps.  Endoscopist: Danise Edge  Premedication: Propofol administered by anesthesia  Procedure: The patient was placed in the left lateral decubitus position. Anal inspection and digital rectal exam were normal. The Pentax pediatric colonoscope was introduced into the rectum and advanced to the cecum. A normal-appearing appendiceal orifice and ileocecal valve were identified. Colonic preparation for the exam today was good.  Rectum. Normal. Retroflexed view of the distal rectum normal.  Sigmoid colon. A 3 mm sessile polyp was removed from the sigmoid colon with the cold biopsy forceps.  Descending colon. Normal  Splenic flexure. Normal  Transverse colon. Normal  Hepatic flexure. Normal  ascending colon. Normal  Cecum and ileocecal valve. From the mid cecum, a 5 mm sessile polyp was removed with the cold snare.  Assessment:  #1. A 3 mm sized polyp was removed from the sigmoid colon   #2. A 5 mm sized polyp was removed from the cecum.  Recommendations: Schedule repeat surveillance colonoscopy in 5 years.

## 2013-03-19 NOTE — Anesthesia Postprocedure Evaluation (Signed)
  Anesthesia Post-op Note  Patient: [name]  Procedure(s) Performed: Procedure(s) (LRB): COLONOSCOPY WITH PROPOFOL (N/A)  Patient Location: PACU  Anesthesia Type: MAC  Level of Consciousness: awake and alert   Airway and Oxygen Therapy: Patient Spontanous Breathing  Post-op Pain: mild  Post-op Assessment: Post-op Vital signs reviewed, Patient's Cardiovascular Status Stable, Respiratory Function Stable, Patent Airway and No signs of Nausea or vomiting  Last Vitals:  Filed Vitals:   03/19/13 0910  BP: 116/74  Temp:   Resp: 19    Post-op Vital Signs: stable   Complications: No apparent anesthesia complications

## 2013-03-19 NOTE — H&P (Signed)
  Problem: Personal history of adenomatous colon polyps. Normal surveillance colonoscopy 03/09/2010.  History: The patient is a 67 year old male born April 27, 1945. The patient has undergone colonoscopic exams in the past with removal of adenomatous colon polyps. He underwent a normal surveillance colonoscopy on 03/09/2010. The colon prep for the exam was rated as been poor. The patient has obstructive sleep apnea syndrome.  The patient is scheduled to undergo a surveillance colonoscopy using propofol sedation today.  Past medical history: Hypertension. Lung cancer surgery. Personal history of adenomatous colon polyps. Colonic diverticulosis. Obstructive sleep apnea syndrome. Gastroesophageal reflux. Hypercholesterolemia. Tonsillectomy. Lumbar laminectomy. Left knee arthroscopic surgery. Right upper lobe removal for adenocarcinoma of the lung.  TURP.  Allergies: Lisinopril causes cough.  Exam: The patient is alert and lying comfortably on the endoscopy stretcher. Lungs are clear to auscultation. Cardiac exam reveals a regular rhythm. Abdomen is soft and nontender to palpation.  Plan: Proceed with surveillance colonoscopy.

## 2013-03-20 ENCOUNTER — Encounter (HOSPITAL_COMMUNITY): Payer: Self-pay | Admitting: Gastroenterology

## 2013-04-11 DIAGNOSIS — E039 Hypothyroidism, unspecified: Secondary | ICD-10-CM | POA: Diagnosis not present

## 2013-04-12 DIAGNOSIS — E039 Hypothyroidism, unspecified: Secondary | ICD-10-CM | POA: Diagnosis not present

## 2013-04-12 DIAGNOSIS — Z79899 Other long term (current) drug therapy: Secondary | ICD-10-CM | POA: Diagnosis not present

## 2013-04-12 DIAGNOSIS — I10 Essential (primary) hypertension: Secondary | ICD-10-CM | POA: Diagnosis not present

## 2013-04-12 DIAGNOSIS — E7800 Pure hypercholesterolemia, unspecified: Secondary | ICD-10-CM | POA: Diagnosis not present

## 2013-04-12 DIAGNOSIS — E559 Vitamin D deficiency, unspecified: Secondary | ICD-10-CM | POA: Diagnosis not present

## 2013-04-12 DIAGNOSIS — Z0000 Encounter for general adult medical examination without abnormal findings: Secondary | ICD-10-CM | POA: Diagnosis not present

## 2013-04-23 ENCOUNTER — Ambulatory Visit (HOSPITAL_COMMUNITY)
Admission: RE | Admit: 2013-04-23 | Discharge: 2013-04-23 | Disposition: A | Discharge status: Home / Self Care | Payer: Medicare Other | Source: Ambulatory Visit | Attending: Internal Medicine | Admitting: Internal Medicine

## 2013-04-23 ENCOUNTER — Other Ambulatory Visit (HOSPITAL_BASED_OUTPATIENT_CLINIC_OR_DEPARTMENT_OTHER): Payer: Medicare Other

## 2013-04-23 DIAGNOSIS — C3410 Malignant neoplasm of upper lobe, unspecified bronchus or lung: Secondary | ICD-10-CM

## 2013-04-23 DIAGNOSIS — K8020 Calculus of gallbladder without cholecystitis without obstruction: Secondary | ICD-10-CM | POA: Insufficient documentation

## 2013-04-23 DIAGNOSIS — Z09 Encounter for follow-up examination after completed treatment for conditions other than malignant neoplasm: Secondary | ICD-10-CM | POA: Diagnosis not present

## 2013-04-23 DIAGNOSIS — C3490 Malignant neoplasm of unspecified part of unspecified bronchus or lung: Secondary | ICD-10-CM | POA: Diagnosis not present

## 2013-04-23 DIAGNOSIS — R918 Other nonspecific abnormal finding of lung field: Secondary | ICD-10-CM | POA: Diagnosis not present

## 2013-04-23 DIAGNOSIS — Z85118 Personal history of other malignant neoplasm of bronchus and lung: Secondary | ICD-10-CM | POA: Diagnosis not present

## 2013-04-23 LAB — CBC WITH DIFFERENTIAL/PLATELET
BASO%: 1 % (ref 0.0–2.0)
Basophils Absolute: 0.1 10*3/uL (ref 0.0–0.1)
EOS ABS: 0.2 10*3/uL (ref 0.0–0.5)
EOS%: 3.5 % (ref 0.0–7.0)
HCT: 39.5 % (ref 38.4–49.9)
HGB: 13.5 g/dL (ref 13.0–17.1)
LYMPH%: 35.9 % (ref 14.0–49.0)
MCH: 33.8 pg — ABNORMAL HIGH (ref 27.2–33.4)
MCHC: 34.3 g/dL (ref 32.0–36.0)
MCV: 98.7 fL — ABNORMAL HIGH (ref 79.3–98.0)
MONO#: 0.7 10*3/uL (ref 0.1–0.9)
MONO%: 10.4 % (ref 0.0–14.0)
NEUT%: 49.2 % (ref 39.0–75.0)
NEUTROS ABS: 3.4 10*3/uL (ref 1.5–6.5)
Platelets: 246 10*3/uL (ref 140–400)
RBC: 4 10*6/uL — AB (ref 4.20–5.82)
RDW: 12.9 % (ref 11.0–14.6)
WBC: 6.8 10*3/uL (ref 4.0–10.3)
lymph#: 2.4 10*3/uL (ref 0.9–3.3)

## 2013-04-23 LAB — COMPREHENSIVE METABOLIC PANEL (CC13)
ALT: 22 U/L (ref 0–55)
AST: 19 U/L (ref 5–34)
Albumin: 3.9 g/dL (ref 3.5–5.0)
Alkaline Phosphatase: 57 U/L (ref 40–150)
Anion Gap: 8 mEq/L (ref 3–11)
BUN: 14.6 mg/dL (ref 7.0–26.0)
CO2: 26 meq/L (ref 22–29)
Calcium: 9.2 mg/dL (ref 8.4–10.4)
Chloride: 103 mEq/L (ref 98–109)
Creatinine: 0.9 mg/dL (ref 0.7–1.3)
Glucose: 100 mg/dl (ref 70–140)
Potassium: 3.6 mEq/L (ref 3.5–5.1)
SODIUM: 138 meq/L (ref 136–145)
TOTAL PROTEIN: 6.4 g/dL (ref 6.4–8.3)
Total Bilirubin: 0.35 mg/dL (ref 0.20–1.20)

## 2013-04-23 MED ORDER — IOHEXOL 300 MG/ML  SOLN
100.0000 mL | Freq: Once | INTRAMUSCULAR | Status: AC | PRN
  Administered 2013-04-23: 100 mL via INTRAVENOUS

## 2013-04-25 ENCOUNTER — Ambulatory Visit (HOSPITAL_BASED_OUTPATIENT_CLINIC_OR_DEPARTMENT_OTHER): Payer: Medicare Other | Admitting: Internal Medicine

## 2013-04-25 ENCOUNTER — Encounter: Payer: Self-pay | Admitting: Internal Medicine

## 2013-04-25 ENCOUNTER — Telehealth: Payer: Self-pay | Admitting: Internal Medicine

## 2013-04-25 VITALS — BP 138/78 | HR 65 | Temp 97.8°F | Resp 18 | Ht 72.0 in | Wt 243.7 lb

## 2013-04-25 DIAGNOSIS — C3410 Malignant neoplasm of upper lobe, unspecified bronchus or lung: Secondary | ICD-10-CM | POA: Diagnosis not present

## 2013-04-25 DIAGNOSIS — C3490 Malignant neoplasm of unspecified part of unspecified bronchus or lung: Secondary | ICD-10-CM

## 2013-04-25 NOTE — Progress Notes (Signed)
[facility] Telephone:([phone]   Fax:(336) [phone]  OFFICE PROGRESS NOTE  Henrine Screws, MD [address] [place] [address]  DIAGNOSIS: Stage IA (T1, N0, MX) non-small cell lung cancer, adenocarcinoma diagnosed in September of 2007   PRIOR THERAPY: Status post right upper lobectomy under the care of Dr. Roxan Hockey on 01/23/2006.   CURRENT THERAPY: Observation.  DISEASE STAGE: Stage IA (T1, N0, MX) non-small cell lung cancer, adenocarcinoma diagnosed in September of 2007   CHEMOTHERAPY INTENT: Curative.  CURRENT # OF CHEMOTHERAPY CYCLES: 0  CURRENT ANTIEMETICS: None  CURRENT SMOKING STATUS: Non-smoker  ORAL CHEMOTHERAPY AND CONSENT: None  CURRENT BISPHOSPHONATES USE: None  LIVING WILL AND CODE STATUS: Full code.  INTERVAL HISTORY: [name] 68 y.o. male returns to the clinic today for six-month followup visit accompanied by his wife. The patient is feeling fine today with no specific complaints.  He denied having any significant chest pain, shortness breath, cough or hemoptysis. The patient denied having any weight loss or night sweats. He has no fever or chills, nausea or vomiting. He had repeat CT scan of the chest performed recently and he is here for evaluation and discussion of his scan results.  MEDICAL HISTORY: Past Medical History  Diagnosis Date  . Hypertension   . Hypercholesterolemia   . Sinus disease     hx of  . History of pneumonia  last 3-4 yrs ago    3 -4 different times  . Hypothyroidism   . Sleep apnea     uses cpap setting opf 12  . History of agent Orange exposure 44-45 yrs ago  . Complication of anesthesia 2007    quit breathing with bronchoscopy, had to spend night  . lung ca dx'd 207    lung right    ALLERGIES:  is allergic to lisinopril.  MEDICATIONS:  Current Outpatient Prescriptions  Medication Sig Dispense Refill  . amitriptyline (ELAVIL) 50 MG tablet Take 50 mg by mouth at  bedtime.      [name] aspirin 81 MG tablet Take 81 mg by mouth every morning.       . diltiazem (CARDIZEM CD) 360 MG 24 hr capsule Take 360 mg by mouth every morning.       . docusate sodium (COLACE) 100 MG capsule Take 100 mg by mouth 2 (two) times daily.      [name] levETIRAcetam (KEPPRA) 500 MG tablet Take 500 mg by mouth daily.       . Levothyroxine Sodium 50 MCG CAPS Take 50 mcg by mouth daily before breakfast.       . metoprolol succinate (TOPROL-XL) 25 MG 24 hr tablet Take 25 mg by mouth every morning.       . Multiple Vitamins-Minerals (CENTRUM SILVER PO) Take 1 tablet by mouth daily.       . Omega-3 Fat Ac-Cholecalciferol (MINICAPS VITAMIN-D/OMEGA-3) [id number] MG-UNIT CAPS Take 1 tablet by mouth daily.       . psyllium (REGULOID) 0.52 G capsule Take 0.52 g by mouth daily.        . rosuvastatin (CRESTOR) 5 MG tablet Take 5 mg by mouth every morning.       . valsartan-hydrochlorothiazide (DIOVAN-HCT) 320-25 MG per tablet Take 1 tablet by mouth every morning.       . metoCLOPramide (REGLAN) 10 MG tablet Take 10 mg by mouth every 6 (six) hours as needed for nausea.       . naproxen sodium (ANAPROX)  550 MG tablet Take 550 mg by mouth 3 (three) times daily as needed for moderate pain.       . nitroGLYCERIN (NITROSTAT) 0.4 MG SL tablet Place 0.4 mg under the tongue every 5 (five) minutes as needed for chest pain.        No current facility-administered medications for this visit.    SURGICAL HISTORY:  Past Surgical History  Procedure Laterality Date  . Cystourethroscopy, gyrus turp.  04/16/2010  . Torn medial and lateral menisci, left knee.  11/06/2006  . Right video-assisted thoracoscopy, right upper lobectomy and  01/23/2006  . The olympus video bronchoscope was introduced via the right  12/21/2005  . Nasal sinus surgery  1987  . Tonsillectomy  1957  . Colonoscopy with propofol N/A 03/19/2013    Procedure: COLONOSCOPY WITH PROPOFOL;  Surgeon: Garlan Fair, MD;  Location: [facility];   Service: Endoscopy;  Laterality: N/A;    REVIEW OF SYSTEMS:  A comprehensive review of systems was negative.   PHYSICAL EXAMINATION: General appearance: alert, cooperative and no distress Head: Normocephalic, without obvious abnormality, atraumatic Neck: no adenopathy Lymph nodes: Cervical, supraclavicular, and axillary nodes normal. Resp: clear to auscultation bilaterally Cardio: regular rate and rhythm, S1, S2 normal, no murmur, click, rub or gallop GI: soft, non-tender; bowel sounds normal; no masses,  no organomegaly Extremities: extremities normal, atraumatic, no cyanosis or edema  ECOG PERFORMANCE STATUS: 1 - Symptomatic but completely ambulatory  Blood pressure 138/78, pulse 65, temperature 97.8 F (36.6 C), temperature source Oral, resp. rate 18, height 6' (1.829 m), weight 243 lb 11.2 oz (110.542 kg).  LABORATORY DATA: Lab Results  Component Value Date   WBC 6.8 04/23/2013   HGB 13.5 04/23/2013   HCT 39.5 04/23/2013   MCV 98.7* 04/23/2013   PLT 246 04/23/2013      Chemistry      Component Value Date/Time   NA 138 04/23/2013 0757   NA 139 10/20/2011 0903   NA 139 06/25/2010 0758   K 3.6 04/23/2013 0757   K 3.4 10/20/2011 0903   K 3.9 06/25/2010 0758   CL 102 04/24/2012 0853   CL 101 10/20/2011 0903   CL 103 06/25/2010 0758   CO2 26 04/23/2013 0757   CO2 29 10/20/2011 0903   CO2 28 06/25/2010 0758   BUN 14.6 04/23/2013 0757   BUN 12 10/20/2011 0903   BUN 17 06/25/2010 0758   CREATININE 0.9 04/23/2013 0757   CREATININE 0.9 10/20/2011 0903   CREATININE 1.03 06/25/2010 0758      Component Value Date/Time   CALCIUM 9.2 04/23/2013 0757   CALCIUM 9.0 10/20/2011 0903   CALCIUM 9.7 06/25/2010 0758   ALKPHOS 57 04/23/2013 0757   ALKPHOS 52 10/20/2011 0903   ALKPHOS 53 06/25/2010 0758   AST 19 04/23/2013 0757   AST 24 10/20/2011 0903   AST 22 06/25/2010 0758   ALT 22 04/23/2013 0757   ALT 31 10/20/2011 0903   ALT 22 06/25/2010 0758   BILITOT 0.35 04/23/2013 0757   BILITOT 0.60 10/20/2011  0903   BILITOT 0.3 06/25/2010 0758       RADIOGRAPHIC STUDIES: Ct Chest W Contrast  04/23/2013   CLINICAL DATA:  Followup lung cancer  EXAM: CT CHEST WITH CONTRAST  TECHNIQUE: Multidetector CT imaging of the chest was performed during intravenous contrast administration.  CONTRAST:  179mL OMNIPAQUE IOHEXOL 300 MG/ML  SOLN  COMPARISON:  10/23/2012  FINDINGS: No pleural effusions identified. Postoperative change and volume loss compatible with right  upper lobectomy. No worrisome nodules or mass is identified. Right upper lung nodule measures 1.6 x 1.0 cm, image 20/series 5. This is unchanged from previous exam. The slightly more caudal lung nodule measures 2.2 x 1.5 cm, image 23/series 5. Previously 1.9 x 1.4. At a slightly lower level there is a 1.4 x 0.9 cm nodule, image 25/series 5. Previously 1.2 x 0.7 cm. The left lung appears clear.  The trachea appears patent and is midline. The heart size appears normal. No pericardial effusion. No enlarged mediastinal or hilar lymph nodes. No axillary or supraclavicular lymph nodes.  Incidental imaging through the upper abdomen again shows a stone within the gallbladder. No acute findings are identified and there is no evidence for upper abdominal metastases.  Review of the visualized bony structures is negative for aggressive lytic or sclerotic bone lesion.  IMPRESSION: 1. No acute findings. 2. Two of the three right lung nodules measure slightly increased in size when compared with previous exam. These differences may reflect differences in technique and or slice selection. Cannot rule out residual/recurrence of tumor and close interval follow-up is advised.   Electronically Signed   By: Kerby Moors M.D.   On: 04/23/2013 09:53   ASSESSMENT AND PLAN: This is a very pleasant 68 years old white male with stage IA non-small cell lung cancer status post right upper lobectomy and has been observation since October of 2007 with no evidence for disease recurrence. I  discussed the scan results with the patient and his wife. I recommended for him to continue on observation with repeat CT scan of the chest in 6 months.   I advised patient to followup with his primary care physician for further evaluation of persistent. Patient was advised to call immediately if he has any other concerning symptoms in the interval.  All questions were answered. The patient knows to call the clinic with any problems, questions or concerns. We can certainly see the patient much sooner if necessary.  Disclaimer: This note was dictated with voice recognition software. Similar sounding words can inadvertently be transcribed and may not be corrected upon review.

## 2013-04-25 NOTE — Telephone Encounter (Signed)
Gave pt appt for July 2015 lab and MD

## 2013-04-25 NOTE — Patient Instructions (Signed)
Followup visit in 6 months with repeat CT scan of the chest.

## 2013-05-08 DIAGNOSIS — Z23 Encounter for immunization: Secondary | ICD-10-CM | POA: Diagnosis not present

## 2013-05-08 DIAGNOSIS — I10 Essential (primary) hypertension: Secondary | ICD-10-CM | POA: Diagnosis not present

## 2013-05-08 DIAGNOSIS — E039 Hypothyroidism, unspecified: Secondary | ICD-10-CM | POA: Diagnosis not present

## 2013-05-08 DIAGNOSIS — E559 Vitamin D deficiency, unspecified: Secondary | ICD-10-CM | POA: Diagnosis not present

## 2013-05-08 DIAGNOSIS — Z79899 Other long term (current) drug therapy: Secondary | ICD-10-CM | POA: Diagnosis not present

## 2013-05-08 DIAGNOSIS — E7800 Pure hypercholesterolemia, unspecified: Secondary | ICD-10-CM | POA: Diagnosis not present

## 2013-05-08 DIAGNOSIS — Z0000 Encounter for general adult medical examination without abnormal findings: Secondary | ICD-10-CM | POA: Diagnosis not present

## 2013-05-08 DIAGNOSIS — Z125 Encounter for screening for malignant neoplasm of prostate: Secondary | ICD-10-CM | POA: Diagnosis not present

## 2013-05-08 DIAGNOSIS — Z1331 Encounter for screening for depression: Secondary | ICD-10-CM | POA: Diagnosis not present

## 2013-08-06 DIAGNOSIS — G43019 Migraine without aura, intractable, without status migrainosus: Secondary | ICD-10-CM | POA: Diagnosis not present

## 2013-08-06 DIAGNOSIS — G43119 Migraine with aura, intractable, without status migrainosus: Secondary | ICD-10-CM | POA: Diagnosis not present

## 2013-10-22 ENCOUNTER — Other Ambulatory Visit (HOSPITAL_BASED_OUTPATIENT_CLINIC_OR_DEPARTMENT_OTHER): Payer: Medicare Other

## 2013-10-22 ENCOUNTER — Encounter (HOSPITAL_COMMUNITY): Payer: Self-pay

## 2013-10-22 ENCOUNTER — Ambulatory Visit (HOSPITAL_COMMUNITY)
Admission: RE | Admit: 2013-10-22 | Discharge: 2013-10-22 | Disposition: A | Discharge status: Home / Self Care | Payer: Medicare Other | Source: Ambulatory Visit | Attending: Internal Medicine | Admitting: Internal Medicine

## 2013-10-22 DIAGNOSIS — K8020 Calculus of gallbladder without cholecystitis without obstruction: Secondary | ICD-10-CM | POA: Insufficient documentation

## 2013-10-22 DIAGNOSIS — C3490 Malignant neoplasm of unspecified part of unspecified bronchus or lung: Secondary | ICD-10-CM | POA: Insufficient documentation

## 2013-10-22 DIAGNOSIS — C3410 Malignant neoplasm of upper lobe, unspecified bronchus or lung: Secondary | ICD-10-CM | POA: Diagnosis not present

## 2013-10-22 DIAGNOSIS — Z902 Acquired absence of lung [part of]: Secondary | ICD-10-CM | POA: Insufficient documentation

## 2013-10-22 DIAGNOSIS — R918 Other nonspecific abnormal finding of lung field: Secondary | ICD-10-CM | POA: Insufficient documentation

## 2013-10-22 LAB — COMPREHENSIVE METABOLIC PANEL (CC13)
ALT: 25 U/L (ref 0–55)
AST: 22 U/L (ref 5–34)
Albumin: 3.9 g/dL (ref 3.5–5.0)
Alkaline Phosphatase: 52 U/L (ref 40–150)
Anion Gap: 8 mEq/L (ref 3–11)
BUN: 13.6 mg/dL (ref 7.0–26.0)
CO2: 26 mEq/L (ref 22–29)
Calcium: 9.4 mg/dL (ref 8.4–10.4)
Chloride: 104 mEq/L (ref 98–109)
Creatinine: 1 mg/dL (ref 0.7–1.3)
Glucose: 104 mg/dl (ref 70–140)
Potassium: 3.6 mEq/L (ref 3.5–5.1)
Sodium: 138 mEq/L (ref 136–145)
TOTAL PROTEIN: 6.5 g/dL (ref 6.4–8.3)
Total Bilirubin: 0.38 mg/dL (ref 0.20–1.20)

## 2013-10-22 LAB — CBC WITH DIFFERENTIAL/PLATELET
BASO%: 0.7 % (ref 0.0–2.0)
Basophils Absolute: 0 10*3/uL (ref 0.0–0.1)
EOS%: 3.5 % (ref 0.0–7.0)
Eosinophils Absolute: 0.2 10*3/uL (ref 0.0–0.5)
HCT: 39.1 % (ref 38.4–49.9)
HGB: 13.8 g/dL (ref 13.0–17.1)
LYMPH%: 41.1 % (ref 14.0–49.0)
MCH: 33.7 pg — ABNORMAL HIGH (ref 27.2–33.4)
MCHC: 35.3 g/dL (ref 32.0–36.0)
MCV: 95.6 fL (ref 79.3–98.0)
MONO#: 0.5 10*3/uL (ref 0.1–0.9)
MONO%: 8.8 % (ref 0.0–14.0)
NEUT#: 2.7 10*3/uL (ref 1.5–6.5)
NEUT%: 45.9 % (ref 39.0–75.0)
PLATELETS: 185 10*3/uL (ref 140–400)
RBC: 4.09 10*6/uL — ABNORMAL LOW (ref 4.20–5.82)
RDW: 12.4 % (ref 11.0–14.6)
WBC: 5.9 10*3/uL (ref 4.0–10.3)
lymph#: 2.4 10*3/uL (ref 0.9–3.3)
nRBC: 0 % (ref 0–0)

## 2013-10-22 MED ORDER — IOHEXOL 300 MG/ML  SOLN
80.0000 mL | Freq: Once | INTRAMUSCULAR | Status: AC | PRN
  Administered 2013-10-22: 80 mL via INTRAVENOUS

## 2013-10-23 ENCOUNTER — Encounter: Payer: Self-pay | Admitting: Internal Medicine

## 2013-10-23 ENCOUNTER — Other Ambulatory Visit: Payer: Self-pay | Admitting: *Deleted

## 2013-10-23 ENCOUNTER — Telehealth: Payer: Self-pay | Admitting: Internal Medicine

## 2013-10-23 ENCOUNTER — Ambulatory Visit (HOSPITAL_BASED_OUTPATIENT_CLINIC_OR_DEPARTMENT_OTHER): Payer: Medicare Other | Admitting: Internal Medicine

## 2013-10-23 VITALS — BP 131/82 | HR 67 | Temp 98.1°F | Resp 18 | Ht 72.0 in | Wt 250.3 lb

## 2013-10-23 DIAGNOSIS — Z85118 Personal history of other malignant neoplasm of bronchus and lung: Secondary | ICD-10-CM | POA: Diagnosis not present

## 2013-10-23 DIAGNOSIS — C3411 Malignant neoplasm of upper lobe, right bronchus or lung: Secondary | ICD-10-CM

## 2013-10-23 DIAGNOSIS — C3490 Malignant neoplasm of unspecified part of unspecified bronchus or lung: Secondary | ICD-10-CM

## 2013-10-23 NOTE — Telephone Encounter (Signed)
Pt confirmed labs/ov per 07/21 POF, gave pt AVS...KJ °

## 2013-10-23 NOTE — Progress Notes (Signed)
[facility] [phone]) [phone]   Fax:(336) [phone]  OFFICE PROGRESS NOTE  Henrine Screws, MD [address] [place] [address]  DIAGNOSIS: Stage IA (T1, N0, MX) non-small cell lung cancer, adenocarcinoma diagnosed in September of 2007   PRIOR THERAPY: Status post right upper lobectomy under the care of Dr. Roxan Hockey on 01/23/2006.   CURRENT THERAPY: Observation.  DISEASE STAGE: Stage IA (T1, N0, MX) non-small cell lung cancer, adenocarcinoma diagnosed in September of 2007   CHEMOTHERAPY INTENT: Curative.  CURRENT # OF CHEMOTHERAPY CYCLES: 0  CURRENT ANTIEMETICS: None  CURRENT SMOKING STATUS: Non-smoker  ORAL CHEMOTHERAPY AND CONSENT: None  CURRENT BISPHOSPHONATES USE: None  LIVING WILL AND CODE STATUS: Full code.  INTERVAL HISTORY: JULYAN GALES 68 y.o. male returns to the clinic today for six-month followup visit accompanied by his wife. He has been observation for the last 8 years with no significant complaints. The patient is feeling fine today.  He denied having any significant chest pain, shortness of breath, cough or hemoptysis. The patient denied having any weight loss or night sweats. He has no fever or chills, nausea or vomiting. He had repeat CT scan of the chest performed recently and he is here for evaluation and discussion of his scan results.  MEDICAL HISTORY: Past Medical History  Diagnosis Date  . Hypertension   . Hypercholesterolemia   . Sinus disease     hx of  . History of pneumonia  last 3-4 yrs ago    3 -4 different times  . Hypothyroidism   . Sleep apnea     uses cpap setting opf 12  . History of agent Orange exposure 44-45 yrs ago  . Complication of anesthesia 2007    quit breathing with bronchoscopy, had to spend night  . lung ca dx'd 207    lung right    ALLERGIES:  is allergic to lisinopril.  MEDICATIONS:  Current Outpatient Prescriptions  Medication Sig Dispense Refill  .  amitriptyline (ELAVIL) 50 MG tablet Take 50 mg by mouth at bedtime.      [name] aspirin 81 MG tablet Take 81 mg by mouth every morning.       . cholecalciferol (VITAMIN D) 1000 UNITS tablet Take 1,000 Units by mouth daily.      [name] diltiazem (CARDIZEM CD) 360 MG 24 hr capsule Take 360 mg by mouth every morning.       . docusate sodium (COLACE) 100 MG capsule Take 100 mg by mouth 2 (two) times daily.      [name] levETIRAcetam (KEPPRA) 500 MG tablet Take 500 mg by mouth daily.       . Levothyroxine Sodium 50 MCG CAPS Take 50 mcg by mouth daily before breakfast.       . metoCLOPramide (REGLAN) 10 MG tablet Take 10 mg by mouth every 6 (six) hours as needed for nausea.       . metoprolol succinate (TOPROL-XL) 25 MG 24 hr tablet Take 25 mg by mouth every morning.       . Multiple Vitamins-Minerals (CENTRUM SILVER PO) Take 1 tablet by mouth daily.       . naproxen sodium (ANAPROX) 550 MG tablet Take 550 mg by mouth 3 (three) times daily as needed for moderate pain.       . nitroGLYCERIN (NITROSTAT) 0.4 MG SL tablet Place 0.4 mg under the tongue every 5 (five) minutes as needed for chest pain.       [name]  Omega-3 Fat Ac-Cholecalciferol (MINICAPS VITAMIN-D/OMEGA-3) [phone] MG-UNIT CAPS Take 1 tablet by mouth daily.       . psyllium (REGULOID) 0.52 G capsule Take 0.52 g by mouth daily.        . rosuvastatin (CRESTOR) 5 MG tablet Take 5 mg by mouth every morning.       . valsartan-hydrochlorothiazide (DIOVAN-HCT) 320-25 MG per tablet Take 1 tablet by mouth every morning.        No current facility-administered medications for this visit.    SURGICAL HISTORY:  Past Surgical History  Procedure Laterality Date  . Cystourethroscopy, gyrus turp.  04/16/2010  . Torn medial and lateral menisci, left knee.  11/06/2006  . Right video-assisted thoracoscopy, right upper lobectomy and  01/23/2006  . The olympus video bronchoscope was introduced via the right  12/21/2005  . Nasal sinus surgery  1987  . Tonsillectomy  1957  .  Colonoscopy with propofol N/A 03/19/2013    Procedure: COLONOSCOPY WITH PROPOFOL;  Surgeon: Garlan Fair, MD;  Location: [facility];  Service: Endoscopy;  Laterality: N/A;    REVIEW OF SYSTEMS:  A comprehensive review of systems was negative.   PHYSICAL EXAMINATION: General appearance: alert, cooperative and no distress Head: Normocephalic, without obvious abnormality, atraumatic Neck: no adenopathy Lymph nodes: Cervical, supraclavicular, and axillary nodes normal. Resp: clear to auscultation bilaterally Cardio: regular rate and rhythm, S1, S2 normal, no murmur, click, rub or gallop GI: soft, non-tender; bowel sounds normal; no masses,  no organomegaly Extremities: extremities normal, atraumatic, no cyanosis or edema  ECOG PERFORMANCE STATUS: 1 - Symptomatic but completely ambulatory  Blood pressure 131/82, pulse 67, temperature 98.1 F (36.7 C), temperature source Oral, resp. rate 18, height 6' (1.829 m), weight 250 lb 4.8 oz (113.535 kg), SpO2 94.00%.  LABORATORY DATA: Lab Results  Component Value Date   WBC 5.9 10/22/2013   HGB 13.8 10/22/2013   HCT 39.1 10/22/2013   MCV 95.6 10/22/2013   PLT 185 10/22/2013      Chemistry      Component Value Date/Time   NA 138 10/22/2013 0755   NA 139 10/20/2011 0903   NA 139 06/25/2010 0758   K 3.6 10/22/2013 0755   K 3.4 10/20/2011 0903   K 3.9 06/25/2010 0758   CL 102 04/24/2012 0853   CL 101 10/20/2011 0903   CL 103 06/25/2010 0758   CO2 26 10/22/2013 0755   CO2 29 10/20/2011 0903   CO2 28 06/25/2010 0758   BUN 13.6 10/22/2013 0755   BUN 12 10/20/2011 0903   BUN 17 06/25/2010 0758   CREATININE 1.0 10/22/2013 0755   CREATININE 0.9 10/20/2011 0903   CREATININE 1.03 06/25/2010 0758      Component Value Date/Time   CALCIUM 9.4 10/22/2013 0755   CALCIUM 9.0 10/20/2011 0903   CALCIUM 9.7 06/25/2010 0758   ALKPHOS 52 10/22/2013 0755   ALKPHOS 52 10/20/2011 0903   ALKPHOS 53 06/25/2010 0758   AST 22 10/22/2013 0755   AST 24 10/20/2011 0903   AST  22 06/25/2010 0758   ALT 25 10/22/2013 0755   ALT 31 10/20/2011 0903   ALT 22 06/25/2010 0758   BILITOT 0.38 10/22/2013 0755   BILITOT 0.60 10/20/2011 0903   BILITOT 0.3 06/25/2010 0758       RADIOGRAPHIC STUDIES: Ct Chest W Contrast  10/22/2013   CLINICAL DATA:  Followup lung cancer  EXAM: CT CHEST WITH CONTRAST  TECHNIQUE: Multidetector CT imaging of the chest was performed during intravenous contrast  administration.  CONTRAST:  61mL OMNIPAQUE IOHEXOL 300 MG/ML  SOLN  COMPARISON:  04/23/2013, 10/23/2012, and 10/20/2011  FINDINGS: The heart size appears normal. There is no pericardial effusion. No MR sheath mediastinal or hilar lymph nodes identified. There is no axillary or supraclavicular adenopathy.  No pleural effusion identified. Postoperative change in volume loss compatible with right upper lobectomy. The right upper lung zone nodule is again identified measuring 1.7 x 1.0 cm, image 19/series 5. Previously this measured 1.6 x 1.0 cm. On 10/20/2011 this measured 1.3 x 0.8 cm. Two adjacent ill-defined pulmonary nodules in the right upper lung field are again noted. The more medial lesion measures 2.1 x 1.2 cm, image 23/series 5. On 10/20/2011 this measure 1.8 x 1.3 cm. Previously 2.2 x 1.5 cm. The more peripheral lesion measures 1.4 x 0.9 cm. Previously 1.3 x 0.9 cm. On 10/20/2011 this measured 0.9 x 0.8 cm. On the exam from 10/23/2012 these lesions measured 1.9 x 1.2 cm and 1.1 x 0.7 cm. At a slightly more level there is a 8 mm nodule, image 27/series 5. Previously 8 mm. No pulmonary nodules are identified within the left lung.  Incidental imaging through the upper abdomen shows a large stone within the neck of gallbladder measuring 2.7 cm.  Review of the visualized osseous structures is negative for aggressive lytic or sclerotic bone lesion.  IMPRESSION: 1. Multiple pulmonary lesions are again although within the right lung. Although there has been no significant interval change from 04/23/2013 there  has been gradual increase in size of when compared with studies dating back to 10/23/2012 and 10/20/2011. Findings are suspicious for either metastatic disease or metachronous bronchogenic carcinoma.   Electronically Signed   By: Kerby Moors M.D.   On: 10/22/2013 09:41   ASSESSMENT AND PLAN: This is a very pleasant 68 years old white male with stage IA non-small cell lung cancer status post right upper lobectomy and has been observation since October of 2007 with no evidence for disease progression since the most recent scan but to slightly increased compared to scans performed 2 years ago still concerning for low-grade adenocarcinoma.  I discussed the scan results with the patient and his wife. I recommended for him to continue on observation with repeat CT scan of the chest in 6 months.   I advised patient to followup with his primary care physician for further evaluation of persistent. Patient was advised to call immediately if he has any other concerning symptoms in the interval.  All questions were answered. The patient knows to call the clinic with any problems, questions or concerns. We can certainly see the patient much sooner if necessary.  Disclaimer: This note was dictated with voice recognition software. Similar sounding words can inadvertently be transcribed and may not be corrected upon review.

## 2013-11-19 ENCOUNTER — Encounter: Payer: Self-pay | Admitting: Thoracic Surgery (Cardiothoracic Vascular Surgery)

## 2013-11-19 ENCOUNTER — Ambulatory Visit (INDEPENDENT_AMBULATORY_CARE_PROVIDER_SITE_OTHER): Payer: Medicare Other | Admitting: Thoracic Surgery (Cardiothoracic Vascular Surgery)

## 2013-11-19 VITALS — BP 130/88 | HR 69 | Ht 72.0 in | Wt 250.0 lb

## 2013-11-19 DIAGNOSIS — R918 Other nonspecific abnormal finding of lung field: Secondary | ICD-10-CM | POA: Diagnosis not present

## 2013-11-19 NOTE — Progress Notes (Signed)
HPI:  [name] is a 68 year old nonsmoker who had a right upper lobectomy for stage IA non-small cell carcinoma back in 2007. Over the past several years is been followed with multiple lung nodules in the right lung. The cell show much difference from scan the scan but have increased in size over that period of several years.  He says that he feels well. He will get short of breath he walks of multiple flights of stairs quickly, but otherwise has no issues with his breathing. He is not having any chest pain. His weight has been stable. He's not having headaches or visual changes.  Past Medical History  Diagnosis Date  . Hypertension   . Hypercholesterolemia   . Sinus disease     hx of  . History of pneumonia  last 3-4 yrs ago    3 -4 different times  . Hypothyroidism   . Sleep apnea     uses cpap setting opf 12  . History of agent Orange exposure 44-45 yrs ago  . Complication of anesthesia 2007    quit breathing with bronchoscopy, had to spend night  . lung ca dx'd 207    lung right      Current Outpatient Prescriptions  Medication Sig Dispense Refill  . amitriptyline (ELAVIL) 50 MG tablet Take 50 mg by mouth at bedtime.      [name] aspirin 81 MG tablet Take 81 mg by mouth every morning.       . cholecalciferol (VITAMIN D) 1000 UNITS tablet Take 1,000 Units by mouth daily.      [name] diltiazem (CARDIZEM CD) 360 MG 24 hr capsule Take 360 mg by mouth every morning.       . docusate sodium (COLACE) 100 MG capsule Take 100 mg by mouth 2 (two) times daily.      [name] levETIRAcetam (KEPPRA) 500 MG tablet Take 500 mg by mouth daily.       . Levothyroxine Sodium 50 MCG CAPS Take 50 mcg by mouth daily before breakfast.       . metoCLOPramide (REGLAN) 10 MG tablet Take 10 mg by mouth every 6 (six) hours as needed for nausea.       . metoprolol succinate (TOPROL-XL) 25 MG 24 hr tablet Take 25 mg by mouth every morning.       . Multiple Vitamins-Minerals (CENTRUM SILVER PO) Take 1 tablet by mouth  daily.       . naproxen sodium (ANAPROX) 550 MG tablet Take 550 mg by mouth 3 (three) times daily as needed for moderate pain.       . Omega-3 Fat Ac-Cholecalciferol (MINICAPS VITAMIN-D/OMEGA-3) [phone] MG-UNIT CAPS Take 1 tablet by mouth daily.       . psyllium (REGULOID) 0.52 G capsule Take 0.52 g by mouth daily.        . rosuvastatin (CRESTOR) 5 MG tablet Take 5 mg by mouth every morning.       . valsartan-hydrochlorothiazide (DIOVAN-HCT) 320-25 MG per tablet Take 1 tablet by mouth every morning.       . nitroGLYCERIN (NITROSTAT) 0.4 MG SL tablet Place 0.4 mg under the tongue every 5 (five) minutes as needed for chest pain.        No current facility-administered medications for this visit.    Physical Exam BP 130/88  Pulse 69  Ht 6' (1.829 m)  Wt 250 lb (113.399 kg)  BMI 33.90 kg/m2  SpO2 94% Well-appearing 68 year old male in no acute distress Alert and oriented x3  with no neurologic deficits No cervical or subclavicular adenopathy Lungs diminished the right base, otherwise clear  Diagnostic Tests: CT chest 10/22/2013 CT CHEST WITH CONTRAST  TECHNIQUE:  Multidetector CT imaging of the chest was performed during  intravenous contrast administration.  CONTRAST: 4mL OMNIPAQUE IOHEXOL 300 MG/ML SOLN  COMPARISON: 04/23/2013, 10/23/2012, and 10/20/2011  FINDINGS:  The heart size appears normal. There is no pericardial effusion. No  MR sheath mediastinal or hilar lymph nodes identified. There is no  axillary or supraclavicular adenopathy.  No pleural effusion identified. Postoperative change in volume loss  compatible with right upper lobectomy. The right upper lung zone  nodule is again identified measuring 1.7 x 1.0 cm, image 19/series  5. Previously this measured 1.6 x 1.0 cm. On 10/20/2011 this  measured 1.3 x 0.8 cm. Two adjacent ill-defined pulmonary nodules in  the right upper lung field are again noted. The more medial lesion  measures 2.1 x 1.2 cm, image 23/series 5.  On 10/20/2011 this measure  1.8 x 1.3 cm. Previously 2.2 x 1.5 cm. The more peripheral lesion  measures 1.4 x 0.9 cm. Previously 1.3 x 0.9 cm. On 10/20/2011 this  measured 0.9 x 0.8 cm. On the exam from 10/23/2012 these lesions  measured 1.9 x 1.2 cm and 1.1 x 0.7 cm. At a slightly more level  there is a 8 mm nodule, image 27/series 5. Previously 8 mm. No  pulmonary nodules are identified within the left lung.  Incidental imaging through the upper abdomen shows a large stone  within the neck of gallbladder measuring 2.7 cm.  Review of the visualized osseous structures is negative for  aggressive lytic or sclerotic bone lesion.  IMPRESSION:  1. Multiple pulmonary lesions are again although within the right  lung. Although there has been no significant interval change from  04/23/2013 there has been gradual increase in size of when compared  with studies dating back to 10/23/2012 and 10/20/2011. Findings are  suspicious for either metastatic disease or metachronous  bronchogenic carcinoma.  Electronically Signed  By: Kerby Moors M.D.  On: 10/22/2013 09:41    Impression: 68 year old gentleman with a remote history of lung cancer, treated with a right upper lobectomy in 2007. He has multiple nodules in the right lung that are being followed by CT. There is been no significant interval change in these lesions over the past 6 months. Going back to 2012 there has been very slow growth of these lesions. Although the radiologist is suspicious for metastatic disease, I do not think that is the case at all. There is a small chance these represent multiple synchronous cancers. However that is the case there are extremely slow moving.  I agree with Dr. Julien Coleman continued followup is appropriate. There is no indication for intervention present time. He seen Dr. Julien Coleman in 6 months. I'll plan to see him back in a year as needed sooner.  Plan:  Return in one year CT chest

## 2013-12-24 DIAGNOSIS — Z23 Encounter for immunization: Secondary | ICD-10-CM | POA: Diagnosis not present

## 2014-01-31 DIAGNOSIS — J069 Acute upper respiratory infection, unspecified: Secondary | ICD-10-CM | POA: Diagnosis not present

## 2014-02-06 DIAGNOSIS — G43019 Migraine without aura, intractable, without status migrainosus: Secondary | ICD-10-CM | POA: Diagnosis not present

## 2014-02-06 DIAGNOSIS — G43111 Migraine with aura, intractable, with status migrainosus: Secondary | ICD-10-CM | POA: Diagnosis not present

## 2014-04-08 DIAGNOSIS — H52203 Unspecified astigmatism, bilateral: Secondary | ICD-10-CM | POA: Diagnosis not present

## 2014-04-08 DIAGNOSIS — H2513 Age-related nuclear cataract, bilateral: Secondary | ICD-10-CM | POA: Diagnosis not present

## 2014-04-22 ENCOUNTER — Encounter (HOSPITAL_COMMUNITY): Payer: Self-pay

## 2014-04-22 ENCOUNTER — Other Ambulatory Visit (HOSPITAL_BASED_OUTPATIENT_CLINIC_OR_DEPARTMENT_OTHER): Payer: Medicare Other

## 2014-04-22 ENCOUNTER — Ambulatory Visit (HOSPITAL_COMMUNITY)
Admission: RE | Admit: 2014-04-22 | Discharge: 2014-04-22 | Disposition: A | Discharge status: Home / Self Care | Payer: Medicare Other | Source: Ambulatory Visit | Attending: Internal Medicine | Admitting: Internal Medicine

## 2014-04-22 DIAGNOSIS — Z85118 Personal history of other malignant neoplasm of bronchus and lung: Secondary | ICD-10-CM

## 2014-04-22 DIAGNOSIS — C3411 Malignant neoplasm of upper lobe, right bronchus or lung: Secondary | ICD-10-CM | POA: Diagnosis not present

## 2014-04-22 DIAGNOSIS — Z9889 Other specified postprocedural states: Secondary | ICD-10-CM | POA: Diagnosis not present

## 2014-04-22 DIAGNOSIS — R918 Other nonspecific abnormal finding of lung field: Secondary | ICD-10-CM | POA: Diagnosis not present

## 2014-04-22 LAB — COMPREHENSIVE METABOLIC PANEL (CC13)
ALBUMIN: 4 g/dL (ref 3.5–5.0)
ALK PHOS: 55 U/L (ref 40–150)
ALT: 29 U/L (ref 0–55)
AST: 25 U/L (ref 5–34)
Anion Gap: 7 mEq/L (ref 3–11)
BUN: 14.1 mg/dL (ref 7.0–26.0)
CHLORIDE: 103 meq/L (ref 98–109)
CO2: 28 mEq/L (ref 22–29)
Calcium: 8.9 mg/dL (ref 8.4–10.4)
Creatinine: 0.9 mg/dL (ref 0.7–1.3)
EGFR: 85 mL/min/{1.73_m2} — AB (ref >90)
GLUCOSE: 99 mg/dL (ref 70–140)
POTASSIUM: 3.7 meq/L (ref 3.5–5.1)
SODIUM: 139 meq/L (ref 136–145)
Total Bilirubin: 0.27 mg/dL (ref 0.20–1.20)
Total Protein: 6.4 g/dL (ref 6.4–8.3)

## 2014-04-22 LAB — CBC WITH DIFFERENTIAL/PLATELET
BASO%: 0.3 % (ref 0.0–2.0)
Basophils Absolute: 0 10*3/uL (ref 0.0–0.1)
EOS%: 2.4 % (ref 0.0–7.0)
Eosinophils Absolute: 0.2 10*3/uL (ref 0.0–0.5)
HEMATOCRIT: 40.3 % (ref 38.4–49.9)
HGB: 14.1 g/dL (ref 13.0–17.1)
LYMPH%: 41.3 % (ref 14.0–49.0)
MCH: 34.1 pg — ABNORMAL HIGH (ref 27.2–33.4)
MCHC: 35 g/dL (ref 32.0–36.0)
MCV: 97.3 fL (ref 79.3–98.0)
MONO#: 0.6 10*3/uL (ref 0.1–0.9)
MONO%: 9.9 % (ref 0.0–14.0)
NEUT#: 2.9 10*3/uL (ref 1.5–6.5)
NEUT%: 46.1 % (ref 39.0–75.0)
Platelets: 191 10*3/uL (ref 140–400)
RBC: 4.14 10*6/uL — ABNORMAL LOW (ref 4.20–5.82)
RDW: 12.4 % (ref 11.0–14.6)
WBC: 6.3 10*3/uL (ref 4.0–10.3)
lymph#: 2.6 10*3/uL (ref 0.9–3.3)

## 2014-04-22 MED ORDER — IOHEXOL 300 MG/ML  SOLN
80.0000 mL | Freq: Once | INTRAMUSCULAR | Status: AC | PRN
  Administered 2014-04-22: 80 mL via INTRAVENOUS

## 2014-04-29 ENCOUNTER — Telehealth: Payer: Self-pay | Admitting: Internal Medicine

## 2014-04-29 ENCOUNTER — Encounter: Payer: Self-pay | Admitting: Internal Medicine

## 2014-04-29 ENCOUNTER — Other Ambulatory Visit: Payer: Medicare Other

## 2014-04-29 ENCOUNTER — Ambulatory Visit (HOSPITAL_BASED_OUTPATIENT_CLINIC_OR_DEPARTMENT_OTHER): Payer: Medicare Other | Admitting: Internal Medicine

## 2014-04-29 VITALS — BP 134/73 | HR 65 | Temp 97.6°F | Resp 18 | Ht 72.0 in | Wt 249.4 lb

## 2014-04-29 DIAGNOSIS — Z85118 Personal history of other malignant neoplasm of bronchus and lung: Secondary | ICD-10-CM | POA: Diagnosis not present

## 2014-04-29 DIAGNOSIS — C3411 Malignant neoplasm of upper lobe, right bronchus or lung: Secondary | ICD-10-CM

## 2014-04-29 NOTE — Progress Notes (Signed)
[facility] [phone]   [phone]  OFFICE PROGRESS NOTE  Henrine Screws, MD [address] [place] [address]  DIAGNOSIS: Stage IA (T1, N0, MX) non-small cell lung cancer, adenocarcinoma diagnosed in September of 2007   PRIOR THERAPY: Status post right upper lobectomy under the care of Dr. Roxan Hockey on 01/23/2006.   CURRENT THERAPY: Observation.  DISEASE STAGE: Stage IA (T1, N0, MX) non-small cell lung cancer, adenocarcinoma diagnosed in September of 2007   CHEMOTHERAPY INTENT: Curative.  CURRENT # OF CHEMOTHERAPY CYCLES: 0  CURRENT ANTIEMETICS: None  CURRENT SMOKING STATUS: Non-smoker  ORAL CHEMOTHERAPY AND CONSENT: None  CURRENT BISPHOSPHONATES USE: None  LIVING WILL AND CODE STATUS: Full code.  INTERVAL HISTORY: [name] 69 y.o. male returns to the clinic today for six-month followup visit accompanied by his wife. The patient is feeling fine today with no specific complaints. He denied having any significant chest pain, shortness of breath, cough or hemoptysis. The patient denied having any weight loss or night sweats. He has no fever or chills, nausea or vomiting. He had repeat CT scan of the chest performed recently and he is here for evaluation and discussion of his scan results.  MEDICAL HISTORY: Past Medical History  Diagnosis Date  . Hypertension   . Hypercholesterolemia   . Sinus disease     hx of  . History of pneumonia  last 3-4 yrs ago    3 -4 different times  . Hypothyroidism   . Sleep apnea     uses cpap setting opf 12  . History of agent Orange exposure 44-45 yrs ago  . Complication of anesthesia 2007    quit breathing with bronchoscopy, had to spend night  . lung ca dx'd 2007    lung right    ALLERGIES:  is allergic to lisinopril.  MEDICATIONS:  Current Outpatient Prescriptions  Medication Sig Dispense Refill  . amitriptyline (ELAVIL) 50 MG tablet Take 50 mg by mouth at  bedtime.    [name] aspirin 81 MG tablet Take 81 mg by mouth every morning.     . cholecalciferol (VITAMIN D) 1000 UNITS tablet Take 1,000 Units by mouth daily.    [name] diltiazem (CARDIZEM CD) 360 MG 24 hr capsule Take 360 mg by mouth every morning.     . docusate sodium (COLACE) 100 MG capsule Take 100 mg by mouth 2 (two) times daily.    [name] levETIRAcetam (KEPPRA) 500 MG tablet Take 500 mg by mouth daily.     . Levothyroxine Sodium 50 MCG CAPS Take 50 mcg by mouth daily before breakfast.     . metoprolol succinate (TOPROL-XL) 25 MG 24 hr tablet Take 25 mg by mouth every morning.     . Multiple Vitamins-Minerals (CENTRUM SILVER PO) Take 1 tablet by mouth daily.     . Omega-3 Fat Ac-Cholecalciferol (MINICAPS VITAMIN-D/OMEGA-3) [phone] MG-UNIT CAPS Take 1 tablet by mouth daily.     . psyllium (REGULOID) 0.52 G capsule Take 0.52 g by mouth daily.      . rosuvastatin (CRESTOR) 5 MG tablet Take 5 mg by mouth every morning.     . valsartan-hydrochlorothiazide (DIOVAN-HCT) 320-25 MG per tablet Take 1 tablet by mouth every morning.     . metoCLOPramide (REGLAN) 10 MG tablet Take 10 mg by mouth every 6 (six) hours as needed for nausea.     . naproxen sodium (ANAPROX) 550 MG tablet Take 550 mg by mouth 3 (three)  times daily as needed for moderate pain.     . nitroGLYCERIN (NITROSTAT) 0.4 MG SL tablet Place 0.4 mg under the tongue every 5 (five) minutes as needed for chest pain.      No current facility-administered medications for this visit.    SURGICAL HISTORY:  Past Surgical History  Procedure Laterality Date  . Cystourethroscopy, gyrus turp.  04/16/2010  . Torn medial and lateral menisci, left knee.  11/06/2006  . Right video-assisted thoracoscopy, right upper lobectomy and  01/23/2006  . The olympus video bronchoscope was introduced via the right  12/21/2005  . Nasal sinus surgery  1987  . Tonsillectomy  1957  . Colonoscopy with propofol N/A 03/19/2013    Procedure: COLONOSCOPY WITH PROPOFOL;   Surgeon: Garlan Fair, MD;  Location: [facility];  Service: Endoscopy;  Laterality: N/A;    REVIEW OF SYSTEMS:  A comprehensive review of systems was negative.   PHYSICAL EXAMINATION: General appearance: alert, cooperative and no distress Head: Normocephalic, without obvious abnormality, atraumatic Neck: no adenopathy Lymph nodes: Cervical, supraclavicular, and axillary nodes normal. Resp: clear to auscultation bilaterally Cardio: regular rate and rhythm, S1, S2 normal, no murmur, click, rub or gallop GI: soft, non-tender; bowel sounds normal; no masses,  no organomegaly Extremities: extremities normal, atraumatic, no cyanosis or edema  ECOG PERFORMANCE STATUS: 1 - Symptomatic but completely ambulatory  Blood pressure 134/73, pulse 65, temperature 97.6 F (36.4 C), temperature source Oral, resp. rate 18, height 6' (1.829 m), weight 249 lb 6.4 oz (113.127 kg).  LABORATORY DATA: Lab Results  Component Value Date   WBC 6.3 04/22/2014   HGB 14.1 04/22/2014   HCT 40.3 04/22/2014   MCV 97.3 04/22/2014   PLT 191 04/22/2014      Chemistry      Component Value Date/Time   NA 139 04/22/2014 0753   NA 139 10/20/2011 0903   NA 139 06/25/2010 0758   K 3.7 04/22/2014 0753   K 3.4 10/20/2011 0903   K 3.9 06/25/2010 0758   CL 102 04/24/2012 0853   CL 101 10/20/2011 0903   CL 103 06/25/2010 0758   CO2 28 04/22/2014 0753   CO2 29 10/20/2011 0903   CO2 28 06/25/2010 0758   BUN 14.1 04/22/2014 0753   BUN 12 10/20/2011 0903   BUN 17 06/25/2010 0758   CREATININE 0.9 04/22/2014 0753   CREATININE 0.9 10/20/2011 0903   CREATININE 1.03 06/25/2010 0758      Component Value Date/Time   CALCIUM 8.9 04/22/2014 0753   CALCIUM 9.0 10/20/2011 0903   CALCIUM 9.7 06/25/2010 0758   ALKPHOS 55 04/22/2014 0753   ALKPHOS 52 10/20/2011 0903   ALKPHOS 53 06/25/2010 0758   AST 25 04/22/2014 0753   AST 24 10/20/2011 0903   AST 22 06/25/2010 0758   ALT 29 04/22/2014 0753   ALT 31 10/20/2011  0903   ALT 22 06/25/2010 0758   BILITOT 0.27 04/22/2014 0753   BILITOT 0.60 10/20/2011 0903   BILITOT 0.3 06/25/2010 0758       RADIOGRAPHIC STUDIES: Ct Chest W Contrast  04/22/2014   CLINICAL DATA:  Lung cancer (invasive adenocarcinoma) diagnosed in 2007 status post right upper lobectomy. No current chest complaints. Subsequent encounter.  EXAM: CT CHEST WITH CONTRAST  TECHNIQUE: Multidetector CT imaging of the chest was performed during intravenous contrast administration.  CONTRAST:  56mL OMNIPAQUE IOHEXOL 300 MG/ML  SOLN  COMPARISON:  Chest CT 10/22/2013, 04/23/2013 and 04/24/2012.  FINDINGS: Mediastinum: There are no enlarged mediastinal  or hilar lymph nodes. There is a stable small nodule inferiorly in the thyroid isthmus. The trachea and esophagus demonstrate no significant findings. The heart size is normal. There is no pericardial effusion.There are no significant vascular findings.  Lungs/Pleura: There is no pleural effusion.There are stable postsurgical changes status post right upper lobe resection. The right middle lobe remains clear. There are 2 complex sub solid lesions anteriorly in the right lower lobe. The dominant lesion has an irregular V-shaped morphology. Because of its irregular shape, measurements are difficult, but on axial image 18, the lesion measures 2.2 x 1.3 cm compared with 2.1 x 1.2 cm on the most recent study. Based on serial examinations, this lesion is slowly enlarging. The other sub solid nodule more inferiorly in the right lower lobe has also enlarged, measuring 10 mm on image 22 (most recently 8 mm). There is superior ground-glass density inferiorly in the right lower lobe on image number 30 which is grossly stable. The left lung is clear.  Upper abdomen: 3.1 cm peripherally calcified gallstone again noted. No adrenal mass or abnormality within the visualized Liver demonstrated. There is stable elevation of the right hemidiaphragm.  Musculoskeletal/Chest wall: There  is no axillary adenopathy or chest wall mass. No worrisome osseous findings.  IMPRESSION: 1. Continued slow growth of sub solid right lower lobe nodules status post right upper lobe resection for adenocarcinoma. These findings appearing concerning for recurrent invasive adenocarcinoma. PET-CT recommended for further evaluation. 2. No adenopathy or metastatic disease identified. 3. Cholelithiasis.   Electronically Signed   By: Camie Patience M.D.   On: 04/22/2014 09:16   ASSESSMENT AND PLAN: This is a very pleasant 69 years old white male with stage IA non-small cell lung cancer status post right upper lobectomy and has been observation since October of 2007. The recent CT scan of the chest showed continuous slow gross of the sub-solid right lower lobe nodules concerning for recurrent invasive adenocarcinoma. I discussed the scan results and showed the images to the patient and his wife. I recommended for him to have repeat PET scan for further evaluation of these lesions. I will arrange for the patient a follow-up appointment at the multidisciplinary thoracic oncology clinic with me as well as Dr. Roxan Hockey and radiation oncology for evaluation of his disease and discussion of his treatment options.  The patient was advised to call immediately if he has any other concerning symptoms in the interval.  All questions were answered. The patient knows to call the clinic with any problems, questions or concerns. We can certainly see the patient much sooner if necessary.  Disclaimer: This note was dictated with voice recognition software. Similar sounding words can inadvertently be transcribed and may not be corrected upon review.

## 2014-04-29 NOTE — Telephone Encounter (Signed)
gv and printed pt avs with radiology #

## 2014-04-30 ENCOUNTER — Telehealth: Payer: Self-pay | Admitting: *Deleted

## 2014-04-30 NOTE — Telephone Encounter (Signed)
Called patient with appt for MDc next week.  I explained to him the appt's and he disagreed on how "things should run".  I listened as he vented his frustration stating that was not told to hum.  I asked Dr. [name] to speak with patient.  He did and clarified.  Patient is aware of appt time and place.

## 2014-05-05 ENCOUNTER — Ambulatory Visit: Payer: Medicare Other | Admitting: Internal Medicine

## 2014-05-06 ENCOUNTER — Telehealth: Payer: Self-pay | Admitting: *Deleted

## 2014-05-06 NOTE — Telephone Encounter (Signed)
Called to update patient about appt.  He verbalized understanding of appt time and place.

## 2014-05-07 ENCOUNTER — Ambulatory Visit (HOSPITAL_COMMUNITY)
Admission: RE | Admit: 2014-05-07 | Discharge: 2014-05-07 | Disposition: A | Discharge status: Home / Self Care | Payer: Medicare Other | Source: Ambulatory Visit | Attending: Internal Medicine | Admitting: Internal Medicine

## 2014-05-07 DIAGNOSIS — Z79899 Other long term (current) drug therapy: Secondary | ICD-10-CM | POA: Diagnosis not present

## 2014-05-07 DIAGNOSIS — C3411 Malignant neoplasm of upper lobe, right bronchus or lung: Secondary | ICD-10-CM | POA: Diagnosis not present

## 2014-05-07 DIAGNOSIS — C3491 Malignant neoplasm of unspecified part of right bronchus or lung: Secondary | ICD-10-CM | POA: Diagnosis not present

## 2014-05-07 LAB — GLUCOSE, CAPILLARY: Glucose-Capillary: 102 mg/dL — ABNORMAL HIGH (ref 70–99)

## 2014-05-07 MED ORDER — FLUDEOXYGLUCOSE F - 18 (FDG) INJECTION
13.4000 | Freq: Once | INTRAVENOUS | Status: AC | PRN
  Administered 2014-05-07: 13.4 via INTRAVENOUS

## 2014-05-08 ENCOUNTER — Ambulatory Visit
Admission: RE | Admit: 2014-05-08 | Discharge: 2014-05-08 | Disposition: A | Discharge status: Home / Self Care | Payer: Medicare Other | Source: Ambulatory Visit | Attending: Radiation Oncology | Admitting: Radiation Oncology

## 2014-05-08 ENCOUNTER — Ambulatory Visit (INDEPENDENT_AMBULATORY_CARE_PROVIDER_SITE_OTHER): Payer: Medicare Other | Admitting: Thoracic Surgery (Cardiothoracic Vascular Surgery)

## 2014-05-08 ENCOUNTER — Encounter: Payer: Self-pay | Admitting: Thoracic Surgery (Cardiothoracic Vascular Surgery)

## 2014-05-08 ENCOUNTER — Encounter: Payer: Self-pay | Admitting: Internal Medicine

## 2014-05-08 ENCOUNTER — Other Ambulatory Visit: Payer: Self-pay | Admitting: *Deleted

## 2014-05-08 ENCOUNTER — Telehealth: Payer: Self-pay | Admitting: Internal Medicine

## 2014-05-08 ENCOUNTER — Ambulatory Visit: Payer: Medicare Other | Attending: Internal Medicine | Admitting: Physical Therapy

## 2014-05-08 ENCOUNTER — Encounter: Payer: Self-pay | Admitting: *Deleted

## 2014-05-08 ENCOUNTER — Ambulatory Visit (HOSPITAL_BASED_OUTPATIENT_CLINIC_OR_DEPARTMENT_OTHER): Payer: Medicare Other | Admitting: Internal Medicine

## 2014-05-08 VITALS — BP 134/82 | HR 91 | Temp 97.5°F | Resp 18 | Ht 72.0 in | Wt 252.0 lb

## 2014-05-08 DIAGNOSIS — G4730 Sleep apnea, unspecified: Secondary | ICD-10-CM | POA: Diagnosis not present

## 2014-05-08 DIAGNOSIS — Z8701 Personal history of pneumonia (recurrent): Secondary | ICD-10-CM | POA: Diagnosis not present

## 2014-05-08 DIAGNOSIS — Z85118 Personal history of other malignant neoplasm of bronchus and lung: Secondary | ICD-10-CM | POA: Diagnosis not present

## 2014-05-08 DIAGNOSIS — E780 Pure hypercholesterolemia: Secondary | ICD-10-CM | POA: Insufficient documentation

## 2014-05-08 DIAGNOSIS — R918 Other nonspecific abnormal finding of lung field: Secondary | ICD-10-CM | POA: Diagnosis not present

## 2014-05-08 DIAGNOSIS — C3431 Malignant neoplasm of lower lobe, right bronchus or lung: Secondary | ICD-10-CM | POA: Diagnosis not present

## 2014-05-08 DIAGNOSIS — E039 Hypothyroidism, unspecified: Secondary | ICD-10-CM | POA: Diagnosis not present

## 2014-05-08 DIAGNOSIS — M1990 Unspecified osteoarthritis, unspecified site: Secondary | ICD-10-CM | POA: Insufficient documentation

## 2014-05-08 DIAGNOSIS — I10 Essential (primary) hypertension: Secondary | ICD-10-CM | POA: Insufficient documentation

## 2014-05-08 DIAGNOSIS — C3411 Malignant neoplasm of upper lobe, right bronchus or lung: Secondary | ICD-10-CM

## 2014-05-08 NOTE — Therapy (Signed)
[place], [place], [address] Phone: [phone]   Fax:  [phone]  Physical Therapy Evaluation  Patient Details  Name: [name] MRN: [id number] Date of Birth: [birth year] Referring Provider:  Curt Bears, MD  Encounter Date: 05/08/2014      PT End of Session - 05/08/14 1435    Visit Number 1   Number of Visits 1   Date for PT Re-Evaluation 07/06/14   PT Start Time 5188   PT Stop Time 1415   PT Time Calculation (min) 20 min      Past Medical History  Diagnosis Date  . Hypertension   . Hypercholesterolemia   . Sinus disease     hx of  . History of pneumonia  last 3-4 yrs ago    3 -4 different times  . Hypothyroidism   . Sleep apnea     uses cpap setting opf 12  . History of agent Orange exposure 44-45 yrs ago  . Complication of anesthesia 2007    quit breathing with bronchoscopy, had to spend night  . lung ca dx'd 2007    lung right    Past Surgical History  Procedure Laterality Date  . Cystourethroscopy, gyrus turp.  04/16/2010  . Torn medial and lateral menisci, left knee.  11/06/2006  . Right video-assisted thoracoscopy, right upper lobectomy and  01/23/2006  . The olympus video bronchoscope was introduced via the right  12/21/2005  . Nasal sinus surgery  1987  . Tonsillectomy  1957  . Colonoscopy with propofol N/A 03/19/2013    Procedure: COLONOSCOPY WITH PROPOFOL;  Surgeon: Garlan Fair, MD;  Location: [facility];  Service: Endoscopy;  Laterality: N/A;    There were no vitals taken for this visit.  Visit Diagnosis:  H/O: lung cancer - Plan: PT plan of care cert/re-cert      Subjective Assessment - 05/08/14 1421    Symptoms Occasional cough.   Pertinent History Pt. with h/o stage IA adenocarcinoma s/p lobectomy 01/2006 now with right lower lobe nodule showing growth on recent CT chest.  Pt. has h/o lower back discectomy in 1998 which was successful for  symptom relief.  Treatment for current nodule will first involve needle biopsy, then could include chemo, radiation, and/or pneumonectomy.                                                         Currently in Pain? No/denies          [facility] PT Assessment - 05/08/14 0001    Assessment   Medical Diagnosis h/o adenocarcinoma with new nodule   Precautions   Precautions Other (comment)  cancer precautions   Restrictions   Weight Bearing Restrictions No   Balance Screen   Has the patient fallen in the past 6 months No   Has the patient had a decrease in activity level because of a fear of falling?  No   Is the patient reluctant to leave their home because of a fear of falling?  No   Home Environment   Living Enviornment Private residence   Living Arrangements Spouse/significant other   Type of [place] Two level   Prior Function   Level of Independence Independent with basic ADLs;Independent with homemaking with ambulation;Independent with  gait   Observation/Other Assessments   Observations Well-appearing older male   Sensation   Light Touch Not tested  denies numbness/tingling in feet   Posture/Postural Control   Posture/Postural Control No significant limitations   AROM   Overall AROM  Within functional limits for tasks performed  did active trunk ROM in standing   Strength   Overall Strength Other (comment)  not assessed, but pt. denies weakness   Ambulation/Gait   Ambulation/Gait Yes   Ambulation/Gait Assistance 7: Independent   Ambulation Distance (Feet) --  walks 45 minutes or 2-3 miles most days for exercise   Balance   Balance Assessed Yes   Dynamic Standing Balance   Dynamic Standing - Comments --  reaches 15 inches forward in standing                          PT Education - 05/13/14 1434    Education provided Yes   Education Details posture, breathing, walking, energy conservation, cough splinting   Person(s) Educated  Patient;Spouse   Methods Explanation;Handout   Comprehension Verbalized understanding               Lung Clinic Goals - May 13, 2014 1438    Patient will be able to verbalize understanding of the benefit of exercise to decrease fatigue.   Status Achieved   Patient will be able to verbalize the importance of posture.   Status Achieved   Patient will be able to demonstrate diaphragmatic breathing for improved lung function.   Status Achieved   Patient will be able to verbalize understanding of the role of physical therapy to prevent functional decline and who to contact if physical therapy is needed.   Status Achieved             Plan - May 13, 2014 1435    Clinical Impression Statement Pt. who currently walks 45 minutes for exercise may benefit from therapy going forward to regain strength and endurance after other treatment.   Rehab Potential Excellent   PT Frequency One time visit   PT Treatment/Interventions Patient/family education   PT Next Visit Plan None at this time.   PT Home Exercise Plan See education section.   Consulted and Agree with Plan of Care Patient          G-Codes - May 13, 2014 1438    Functional Assessment Tool Used clinical judgement   Functional Limitation Other PT primary   Other PT Primary Current Status (M5465) At least 1 percent but less than 20 percent impaired, limited or restricted   Other PT Primary Goal Status (K3546) At least 1 percent but less than 20 percent impaired, limited or restricted   Other PT Primary Discharge Status (F6812) At least 1 percent but less than 20 percent impaired, limited or restricted       Problem List Patient Active Problem List   Diagnosis Date Noted  . Lung cancer 05/01/2012  . Sinus disease   . Sleep apnea   . History of pneumonia   . History of degenerative joint disease     Caliya Narine 2014-05-13, 2:53 PM  [place], [place], [address] Phone: [phone]   Fax:  [phone]  Serafina Royals, [place]

## 2014-05-08 NOTE — Progress Notes (Signed)
[facility] Clinical Social Work  Clinical Social Work met with patient/family and medical oncologist at [facility] appointment to offer support and assess for psychosocial needs.  Medical oncologist reviewed patient's diagnosis and recommended treatment plan with patient/family for resection or radiation therapy.    Clinical Social Work briefly discussed Clinical Social Work role and [facility] support programs/services.  Patient and spouse were interested in completing healthcare advance directives.  Patient/spouse plan to contact [facility] when they are ready to complete.  Clinical Social Work encouraged patient to call with any additional questions or concerns.   Lauren Mullis, MSW, LCSW, OSW-C Clinical Social Worker  [facility] [phone]  

## 2014-05-08 NOTE — Progress Notes (Signed)
HPI:  Mr. Lamorte returns today to discuss further workup/ management of his right lung "nodules."  Mr. Rumple is a 69 year old nonsmoker who had a right upper lobectomy for stage IA non-small cell carcinoma back in 2007. Over the past several years is been followed with multiple lung nodules in the right lung. The scans do not show much difference from scan the scan but the nodules have increased in size slowly over a period of several years.  He recently had a repeat CT. The nodules had grown by about 1 mm over 6 months. A PET/CT was done which revealed the larger to be mildly hypermetabolic.  He says he has been feeling well. He denies cough, fever, chills, sweats, weight loss, headaches, visual changes, chest pain and shortness of breath. He has noticed a "nodule' in his neck.   Past Medical History  Diagnosis Date  . Hypertension   . Hypercholesterolemia   . Sinus disease     hx of  . History of pneumonia  last 3-4 yrs ago    3 -4 different times  . Hypothyroidism   . Sleep apnea     uses cpap setting opf 12  . History of agent Orange exposure 44-45 yrs ago  . Complication of anesthesia 2007    quit breathing with bronchoscopy, had to spend night  . lung ca dx'd 2007    lung right      Current Outpatient Prescriptions  Medication Sig Dispense Refill  . amitriptyline (ELAVIL) 50 MG tablet Take 50 mg by mouth at bedtime.    [name] aspirin 81 MG tablet Take 81 mg by mouth every morning.     . cholecalciferol (VITAMIN D) 1000 UNITS tablet Take 1,000 Units by mouth daily.    [name] diltiazem (CARDIZEM CD) 360 MG 24 hr capsule Take 360 mg by mouth every morning.     . docusate sodium (COLACE) 100 MG capsule Take 100 mg by mouth 2 (two) times daily.    [name] levETIRAcetam (KEPPRA) 500 MG tablet Take 500 mg by mouth daily.     . Levothyroxine Sodium 50 MCG CAPS Take 50 mcg by mouth daily before breakfast.     . metoCLOPramide (REGLAN) 10 MG tablet Take 10 mg by mouth every 6 (six) hours as  needed for nausea.     . metoprolol succinate (TOPROL-XL) 25 MG 24 hr tablet Take 25 mg by mouth every morning.     . Multiple Vitamins-Minerals (CENTRUM SILVER PO) Take 1 tablet by mouth daily.     . naproxen sodium (ANAPROX) 550 MG tablet Take 550 mg by mouth 3 (three) times daily as needed for moderate pain.     . nitroGLYCERIN (NITROSTAT) 0.4 MG SL tablet Place 0.4 mg under the tongue every 5 (five) minutes as needed for chest pain.     . Omega-3 Fat Ac-Cholecalciferol (MINICAPS VITAMIN-D/OMEGA-3) [phone] MG-UNIT CAPS Take 1 tablet by mouth daily.     . psyllium (REGULOID) 0.52 G capsule Take 0.52 g by mouth daily.      . rosuvastatin (CRESTOR) 5 MG tablet Take 5 mg by mouth every morning.     . valsartan-hydrochlorothiazide (DIOVAN-HCT) 320-25 MG per tablet Take 1 tablet by mouth every morning.      No current facility-administered medications for this visit.    Physical Exam BP 134/82 mmHg  Pulse 91  Temp(Src) 97.5 F (36.4 C)  Resp 18  Ht 6' (1.829 m)  Wt 252 lb (114.306 kg)  BMI 34.17 kg/m2  SpO2 97% 69 yo man in NAD Well developed and well nourished Alert and oriented x 3 Neck no cervical or supraclavicular adenopathy, 7 mm soft nodule in subcutaneous tissue Lungs decreased BS at right base, otherwise clear Cardiac RRR, normal S1 and S2  Diagnostic Tests: CT CHEST WITH CONTRAST  TECHNIQUE: Multidetector CT imaging of the chest was performed during intravenous contrast administration.  CONTRAST: 8mL OMNIPAQUE IOHEXOL 300 MG/ML SOLN  COMPARISON: Chest CT 10/22/2013, 04/23/2013 and 04/24/2012.  FINDINGS: Mediastinum: There are no enlarged mediastinal or hilar lymph nodes. There is a stable small nodule inferiorly in the thyroid isthmus. The trachea and esophagus demonstrate no significant findings. The heart size is normal. There is no pericardial effusion.There are no significant vascular findings.  Lungs/Pleura: There is no pleural effusion.There are  stable postsurgical changes status post right upper lobe resection. The right middle lobe remains clear. There are 2 complex sub solid lesions anteriorly in the right lower lobe. The dominant lesion has an irregular V-shaped morphology. Because of its irregular shape, measurements are difficult, but on axial image 18, the lesion measures 2.2 x 1.3 cm compared with 2.1 x 1.2 cm on the most recent study. Based on serial examinations, this lesion is slowly enlarging. The other sub solid nodule more inferiorly in the right lower lobe has also enlarged, measuring 10 mm on image 22 (most recently 8 mm). There is superior ground-glass density inferiorly in the right lower lobe on image number 30 which is grossly stable. The left lung is clear.  Upper abdomen: 3.1 cm peripherally calcified gallstone again noted. No adrenal mass or abnormality within the visualized Liver demonstrated. There is stable elevation of the right hemidiaphragm.  Musculoskeletal/Chest wall: There is no axillary adenopathy or chest wall mass. No worrisome osseous findings.  IMPRESSION: 1. Continued slow growth of sub solid right lower lobe nodules status post right upper lobe resection for adenocarcinoma. These findings appearing concerning for recurrent invasive adenocarcinoma. PET-CT recommended for further evaluation. 2. No adenopathy or metastatic disease identified. 3. Cholelithiasis.   Electronically Signed  By: Camie Patience M.D.  On: 04/22/2014 09:16  NUCLEAR MEDICINE PET SKULL BASE TO THIGH  TECHNIQUE: 13.4 mCi F-18 FDG was injected intravenously. Full-ring PET imaging was performed from the skull base to thigh after the radiotracer. CT data was obtained and used for attenuation correction and anatomic localization.  FASTING BLOOD GLUCOSE: Value: 102 mg/dl  COMPARISON: CT chest 04/22/2014, 10/22/2013 and 05/01/2013 and PET 06/25/2010.  FINDINGS: NECK  No hypermetabolic lymph  nodes in the neck. CT images show no acute findings.  CHEST  A bilobed nodular lesion in the right lower lobe collectively measures 1.4 x 4.0 cm (series 8, image 23) with an SUV max of 2.3. Although the overall size measurements are not significantly changed from 10/23/2012, the lesion has grown from 06/25/2010, at which time it was seen as 2 smaller separate nodules. A 9 x 14 mm nodule in the adjacent right lower lobe (CT image 26, series 8) has enlarged from 6 x 6 mm on 10/23/2012. Lesion is borderline in size for PET resolution. No hypermetabolic mediastinal, hilar or axillary lymph nodes. CT images show no acute findings. No pericardial or pleural effusion. Right upper lobectomy.  ABDOMEN/PELVIS  No abnormal hypermetabolism in the liver, adrenal glands, spleen or pancreas. No hypermetabolic lymph nodes. CT images show the liver to be grossly unremarkable. A 3.0 cm stone is seen in the gallbladder. Adrenal glands are unremarkable. 11 mm low-attenuation lesion off the the right  kidney is likely stable and therefore a cyst. Kidneys, spleen, pancreas, stomach and bowel are otherwise grossly unremarkable. No free fluid.  SKELETON  No abnormal osseous hypermetabolism.  IMPRESSION: 1. Mildly hypermetabolic bilobed nodule in the right lower lobe, worrisome for low-grade adenocarcinoma. Adjacent nodule has enlarged from 10/23/2012 and is also worrisome for adenocarcinoma. 2. Cholelithiasis.  Electronically Signed: By: Lorin Picket M.D. On: 05/07/2014 13:44  Impression: 69 yo man with a history of a stage IA non-small cell carcinoma resected with a right upper lobectomy in 2007. He has been followed for several years for an unusual appearing bilobed right lower lobe lesion. It has slowly grown over time. It is mildly hypermetabolic by PET. This could represent a low grade adenocarcinoma.   I reviewed the films personally and concur with Radiology's assessment. I  reviewed the films with Mr and Mrs Sadlon. We discussed the differential diagnosis. We discussed options of continued follow up, biopsy or surgical resection and their advantages and disadvantages.  After our discussion they favor biopsy to attempt to establish a diagnosis. We discussed needle biopsy v bronchoscopic biopsy. I think bronchoscopic biopsy will be difficult due to distortion of the bronchial anatomy and would favor starting with a CT guided needle biopsy as a first step.  Plan: CT guided needle biopsy right lower lobe nodule  Follow up in 2 weeks to discuss results  Remo Lipps C. Roxan Hockey, MD  > 15 minutes face to face

## 2014-05-08 NOTE — Progress Notes (Signed)
[place] Telephone:([phone]) [phone]   Fax:(336) [phone] Multidisciplinary thoracic oncology clinic  OFFICE PROGRESS NOTE  [name],[name], MD [address] [place] [address]  DIAGNOSIS: Questionable recurrent non-small cell lung cancer initially diagnosed as Stage IA (T1, N0, MX) non-small cell lung cancer, adenocarcinoma diagnosed in September of 2007   PRIOR THERAPY: Status post right upper lobectomy under the care of Dr. Roxan Hockey on 01/23/2006.   CURRENT THERAPY: Observation.  DISEASE STAGE: Stage IA (T1, N0, MX) non-small cell lung cancer, adenocarcinoma diagnosed in September of 2007   CHEMOTHERAPY INTENT: Curative.  CURRENT # OF CHEMOTHERAPY CYCLES: 0  CURRENT ANTIEMETICS: None  CURRENT SMOKING STATUS: Non-smoker  ORAL CHEMOTHERAPY AND CONSENT: None  CURRENT BISPHOSPHONATES USE: None  LIVING WILL AND CODE STATUS: Full code.  INTERVAL HISTORY: [name] 69 y.o. male returns to the clinic today for followup visit accompanied by his wife. The patient is feeling fine today with no specific complaints. He denied having any significant chest pain, shortness of breath, cough or hemoptysis. The patient denied having any weight loss or night sweats. He has no fever or chills, nausea or vomiting. His most recent CT scan of the chest showed further evidence for disease progression of the right lower lobe lung nodule. I ordered a PET scan and the patient is here today for evaluation and recommendation regarding treatment of his condition.  MEDICAL HISTORY: Past Medical History  Diagnosis Date  . Hypertension   . Hypercholesterolemia   . Sinus disease     hx of  . History of pneumonia  last 3-4 yrs ago    3 -4 different times  . Hypothyroidism   . Sleep apnea     uses cpap setting opf 12  . History of agent Orange exposure 44-45 yrs ago  . Complication of anesthesia 2007    quit breathing with bronchoscopy, had to spend  night  . lung ca dx'd 2007    lung right    ALLERGIES:  is allergic to lisinopril.  MEDICATIONS:  Current Outpatient Prescriptions  Medication Sig Dispense Refill  . amitriptyline (ELAVIL) 50 MG tablet Take 50 mg by mouth at bedtime.    [name] aspirin 81 MG tablet Take 81 mg by mouth every morning.     . cholecalciferol (VITAMIN D) 1000 UNITS tablet Take 1,000 Units by mouth daily.    [name] diltiazem (CARDIZEM CD) 360 MG 24 hr capsule Take 360 mg by mouth every morning.     . docusate sodium (COLACE) 100 MG capsule Take 100 mg by mouth 2 (two) times daily.    [name] levETIRAcetam (KEPPRA) 500 MG tablet Take 500 mg by mouth daily.     . Levothyroxine Sodium 50 MCG CAPS Take 50 mcg by mouth daily before breakfast.     . metoprolol succinate (TOPROL-XL) 25 MG 24 hr tablet Take 25 mg by mouth every morning.     . Multiple Vitamins-Minerals (CENTRUM SILVER PO) Take 1 tablet by mouth daily.     . naproxen sodium (ANAPROX) 550 MG tablet Take 550 mg by mouth 3 (three) times daily as needed for moderate pain.     . Omega-3 Fat Ac-Cholecalciferol (MINICAPS VITAMIN-D/OMEGA-3) [phone] MG-UNIT CAPS Take 1 tablet by mouth daily.     . psyllium (REGULOID) 0.52 G capsule Take 0.52 g by mouth daily.      . rosuvastatin (CRESTOR) 5 MG tablet Take 5 mg by mouth every morning.     [name]  valsartan-hydrochlorothiazide (DIOVAN-HCT) 320-25 MG per tablet Take 1 tablet by mouth every morning.     . metoCLOPramide (REGLAN) 10 MG tablet Take 10 mg by mouth every 6 (six) hours as needed for nausea.     . nitroGLYCERIN (NITROSTAT) 0.4 MG SL tablet Place 0.4 mg under the tongue every 5 (five) minutes as needed for chest pain.      No current facility-administered medications for this visit.    SURGICAL HISTORY:  Past Surgical History  Procedure Laterality Date  . Cystourethroscopy, gyrus turp.  04/16/2010  . Torn medial and lateral menisci, left knee.  11/06/2006  . Right video-assisted thoracoscopy, right upper lobectomy and   01/23/2006  . The olympus video bronchoscope was introduced via the right  12/21/2005  . Nasal sinus surgery  1987  . Tonsillectomy  1957  . Colonoscopy with propofol N/A 03/19/2013    Procedure: COLONOSCOPY WITH PROPOFOL;  Surgeon: Garlan Fair, MD;  Location: [facility];  Service: Endoscopy;  Laterality: N/A;    REVIEW OF SYSTEMS:  A comprehensive review of systems was negative.   PHYSICAL EXAMINATION: General appearance: alert, cooperative and no distress Head: Normocephalic, without obvious abnormality, atraumatic Neck: no adenopathy Lymph nodes: Cervical, supraclavicular, and axillary nodes normal. Resp: clear to auscultation bilaterally Cardio: regular rate and rhythm, S1, S2 normal, no murmur, click, rub or gallop GI: soft, non-tender; bowel sounds normal; no masses,  no organomegaly Extremities: extremities normal, atraumatic, no cyanosis or edema  ECOG PERFORMANCE STATUS: 1 - Symptomatic but completely ambulatory  Blood pressure 134/82, pulse 91, temperature 97.5 F (36.4 C), temperature source Oral, resp. rate 18, height 6' (1.829 m), weight 252 lb (114.306 kg), SpO2 97 %.  LABORATORY DATA: Lab Results  Component Value Date   WBC 6.3 04/22/2014   HGB 14.1 04/22/2014   HCT 40.3 04/22/2014   MCV 97.3 04/22/2014   PLT 191 04/22/2014      Chemistry      Component Value Date/Time   NA 139 04/22/2014 0753   NA 139 10/20/2011 0903   NA 139 06/25/2010 0758   K 3.7 04/22/2014 0753   K 3.4 10/20/2011 0903   K 3.9 06/25/2010 0758   CL 102 04/24/2012 0853   CL 101 10/20/2011 0903   CL 103 06/25/2010 0758   CO2 28 04/22/2014 0753   CO2 29 10/20/2011 0903   CO2 28 06/25/2010 0758   BUN 14.1 04/22/2014 0753   BUN 12 10/20/2011 0903   BUN 17 06/25/2010 0758   CREATININE 0.9 04/22/2014 0753   CREATININE 0.9 10/20/2011 0903   CREATININE 1.03 06/25/2010 0758      Component Value Date/Time   CALCIUM 8.9 04/22/2014 0753   CALCIUM 9.0 10/20/2011 0903   CALCIUM 9.7  06/25/2010 0758   ALKPHOS 55 04/22/2014 0753   ALKPHOS 52 10/20/2011 0903   ALKPHOS 53 06/25/2010 0758   AST 25 04/22/2014 0753   AST 24 10/20/2011 0903   AST 22 06/25/2010 0758   ALT 29 04/22/2014 0753   ALT 31 10/20/2011 0903   ALT 22 06/25/2010 0758   BILITOT 0.27 04/22/2014 0753   BILITOT 0.60 10/20/2011 0903   BILITOT 0.3 06/25/2010 0758       RADIOGRAPHIC STUDIES: Ct Chest W Contrast  04/22/2014   CLINICAL DATA:  Lung cancer (invasive adenocarcinoma) diagnosed in 2007 status post right upper lobectomy. No current chest complaints. Subsequent encounter.  EXAM: CT CHEST WITH CONTRAST  TECHNIQUE: Multidetector CT imaging of the chest was performed during  intravenous contrast administration.  CONTRAST:  59mL OMNIPAQUE IOHEXOL 300 MG/ML  SOLN  COMPARISON:  Chest CT 10/22/2013, 04/23/2013 and 04/24/2012.  FINDINGS: Mediastinum: There are no enlarged mediastinal or hilar lymph nodes. There is a stable small nodule inferiorly in the thyroid isthmus. The trachea and esophagus demonstrate no significant findings. The heart size is normal. There is no pericardial effusion.There are no significant vascular findings.  Lungs/Pleura: There is no pleural effusion.There are stable postsurgical changes status post right upper lobe resection. The right middle lobe remains clear. There are 2 complex sub solid lesions anteriorly in the right lower lobe. The dominant lesion has an irregular V-shaped morphology. Because of its irregular shape, measurements are difficult, but on axial image 18, the lesion measures 2.2 x 1.3 cm compared with 2.1 x 1.2 cm on the most recent study. Based on serial examinations, this lesion is slowly enlarging. The other sub solid nodule more inferiorly in the right lower lobe has also enlarged, measuring 10 mm on image 22 (most recently 8 mm). There is superior ground-glass density inferiorly in the right lower lobe on image number 30 which is grossly stable. The left lung is clear.   Upper abdomen: 3.1 cm peripherally calcified gallstone again noted. No adrenal mass or abnormality within the visualized Liver demonstrated. There is stable elevation of the right hemidiaphragm.  Musculoskeletal/Chest wall: There is no axillary adenopathy or chest wall mass. No worrisome osseous findings.  IMPRESSION: 1. Continued slow growth of sub solid right lower lobe nodules status post right upper lobe resection for adenocarcinoma. These findings appearing concerning for recurrent invasive adenocarcinoma. PET-CT recommended for further evaluation. 2. No adenopathy or metastatic disease identified. 3. Cholelithiasis.   Electronically Signed   By: Camie Patience M.D.   On: 04/22/2014 09:16   Nm Pet Image Restag (ps) Skull Base To Thigh  05/07/2014   ADDENDUM REPORT: 05/07/2014 18:38  ADDENDUM: The following is added to the impression section of the original report:  3. Mild focal hypermetabolism in the prostate has an SUV max of 4.5. Correlation with PSA is recommended.   Electronically Signed   By: Lorin Picket M.D.   On: 05/07/2014 18:38   05/07/2014   CLINICAL DATA:  Subsequent treatment strategy for lung cancer.  EXAM: NUCLEAR MEDICINE PET SKULL BASE TO THIGH  TECHNIQUE: 13.4 mCi F-18 FDG was injected intravenously. Full-ring PET imaging was performed from the skull base to thigh after the radiotracer. CT data was obtained and used for attenuation correction and anatomic localization.  FASTING BLOOD GLUCOSE:  Value: 102 mg/dl  COMPARISON:  CT chest 04/22/2014, 10/22/2013 and 05/01/2013 and PET 06/25/2010.  FINDINGS: NECK  No hypermetabolic lymph nodes in the neck. CT images show no acute findings.  CHEST  A bilobed nodular lesion in the right lower lobe collectively measures 1.4 x 4.0 cm (series 8, image 23) with an SUV max of 2.3. Although the overall size measurements are not significantly changed from 10/23/2012, the lesion has grown from 06/25/2010, at which time it was seen as 2 smaller separate  nodules. A 9 x 14 mm nodule in the adjacent right lower lobe (CT image 26, series 8) has enlarged from 6 x 6 mm on 10/23/2012. Lesion is borderline in size for PET resolution. No hypermetabolic mediastinal, hilar or axillary lymph nodes. CT images show no acute findings. No pericardial or pleural effusion. Right upper lobectomy.  ABDOMEN/PELVIS  No abnormal hypermetabolism in the liver, adrenal glands, spleen or pancreas. No hypermetabolic lymph nodes. CT  images show the liver to be grossly unremarkable. A 3.0 cm stone is seen in the gallbladder. Adrenal glands are unremarkable. 11 mm low-attenuation lesion off the the right kidney is likely stable and therefore a cyst. Kidneys, spleen, pancreas, stomach and bowel are otherwise grossly unremarkable. No free fluid.  SKELETON  No abnormal osseous hypermetabolism.  IMPRESSION: 1. Mildly hypermetabolic bilobed nodule in the right lower lobe, worrisome for low-grade adenocarcinoma. Adjacent nodule has enlarged from 10/23/2012 and is also worrisome for adenocarcinoma. 2. Cholelithiasis.  Electronically Signed: By: Lorin Picket M.D. On: 05/07/2014 13:44   ASSESSMENT AND PLAN: This is a very pleasant 69 years old white male with stage IA non-small cell lung cancer status post right upper lobectomy and has been observation since October of 2007. The PET scan performed recently showed mildly hypermetabolic bilobed nodule in the right lower lobe worrisome for low-grade adenocarcinoma. Adjacent nodule had also enlarged from July 2014 and also worrisome for adenocarcinoma. I discussed the scan results with the patient today. I'll arrange for him to see Dr. Roxan Hockey later today for evaluation of surgical resection or biopsy. I would see the patient back for follow-up visit in 6 months for reevaluation after his surgical resection or stereotactic radiotherapy. He was advised to call immediately if he has any concerning symptoms in the interval. The patient was  advised to call immediately if he has any other concerning symptoms in the interval.  All questions were answered. The patient knows to call the clinic with any problems, questions or concerns. We can certainly see the patient much sooner if necessary.  Disclaimer: This note was dictated with voice recognition software. Similar sounding words can inadvertently be transcribed and may not be corrected upon review.

## 2014-05-08 NOTE — Telephone Encounter (Signed)
Left message to confirm August appointment. mailed calendar.

## 2014-05-12 ENCOUNTER — Other Ambulatory Visit: Payer: Self-pay | Admitting: Radiology

## 2014-05-13 ENCOUNTER — Other Ambulatory Visit: Payer: Self-pay | Admitting: Internal Medicine

## 2014-05-13 ENCOUNTER — Other Ambulatory Visit: Payer: Self-pay | Admitting: Radiology

## 2014-05-13 DIAGNOSIS — Z0001 Encounter for general adult medical examination with abnormal findings: Secondary | ICD-10-CM | POA: Diagnosis not present

## 2014-05-13 DIAGNOSIS — E559 Vitamin D deficiency, unspecified: Secondary | ICD-10-CM | POA: Diagnosis not present

## 2014-05-13 DIAGNOSIS — G43009 Migraine without aura, not intractable, without status migrainosus: Secondary | ICD-10-CM | POA: Diagnosis not present

## 2014-05-13 DIAGNOSIS — Z125 Encounter for screening for malignant neoplasm of prostate: Secondary | ICD-10-CM | POA: Diagnosis not present

## 2014-05-13 DIAGNOSIS — Z79899 Other long term (current) drug therapy: Secondary | ICD-10-CM | POA: Diagnosis not present

## 2014-05-13 DIAGNOSIS — I2510 Atherosclerotic heart disease of native coronary artery without angina pectoris: Secondary | ICD-10-CM

## 2014-05-13 DIAGNOSIS — I25110 Atherosclerotic heart disease of native coronary artery with unstable angina pectoris: Secondary | ICD-10-CM | POA: Diagnosis not present

## 2014-05-13 DIAGNOSIS — I714 Abdominal aortic aneurysm, without rupture, unspecified: Secondary | ICD-10-CM

## 2014-05-13 DIAGNOSIS — Z1389 Encounter for screening for other disorder: Secondary | ICD-10-CM | POA: Diagnosis not present

## 2014-05-13 DIAGNOSIS — E780 Pure hypercholesterolemia: Secondary | ICD-10-CM | POA: Diagnosis not present

## 2014-05-13 DIAGNOSIS — G4730 Sleep apnea, unspecified: Secondary | ICD-10-CM | POA: Diagnosis not present

## 2014-05-13 DIAGNOSIS — K5730 Diverticulosis of large intestine without perforation or abscess without bleeding: Secondary | ICD-10-CM | POA: Diagnosis not present

## 2014-05-13 DIAGNOSIS — I10 Essential (primary) hypertension: Secondary | ICD-10-CM | POA: Diagnosis not present

## 2014-05-13 DIAGNOSIS — E039 Hypothyroidism, unspecified: Secondary | ICD-10-CM | POA: Diagnosis not present

## 2014-05-13 DIAGNOSIS — I6529 Occlusion and stenosis of unspecified carotid artery: Secondary | ICD-10-CM | POA: Diagnosis not present

## 2014-05-14 ENCOUNTER — Ambulatory Visit (HOSPITAL_COMMUNITY)
Admission: RE | Admit: 2014-05-14 | Discharge: 2014-05-14 | Disposition: A | Discharge status: Home / Self Care | Payer: Medicare Other | Source: Ambulatory Visit | Attending: Diagnostic Radiology | Admitting: Diagnostic Radiology

## 2014-05-14 ENCOUNTER — Encounter (HOSPITAL_COMMUNITY): Payer: Self-pay

## 2014-05-14 ENCOUNTER — Ambulatory Visit (HOSPITAL_COMMUNITY)
Admission: RE | Admit: 2014-05-14 | Discharge: 2014-05-14 | Disposition: A | Discharge status: Home / Self Care | Payer: Medicare Other | Source: Ambulatory Visit | Attending: Thoracic Surgery (Cardiothoracic Vascular Surgery) | Admitting: Thoracic Surgery (Cardiothoracic Vascular Surgery)

## 2014-05-14 DIAGNOSIS — E780 Pure hypercholesterolemia: Secondary | ICD-10-CM | POA: Diagnosis not present

## 2014-05-14 DIAGNOSIS — E039 Hypothyroidism, unspecified: Secondary | ICD-10-CM | POA: Diagnosis not present

## 2014-05-14 DIAGNOSIS — G4730 Sleep apnea, unspecified: Secondary | ICD-10-CM | POA: Diagnosis not present

## 2014-05-14 DIAGNOSIS — R042 Hemoptysis: Secondary | ICD-10-CM | POA: Diagnosis not present

## 2014-05-14 DIAGNOSIS — Z85118 Personal history of other malignant neoplasm of bronchus and lung: Secondary | ICD-10-CM | POA: Insufficient documentation

## 2014-05-14 DIAGNOSIS — Z7982 Long term (current) use of aspirin: Secondary | ICD-10-CM | POA: Insufficient documentation

## 2014-05-14 DIAGNOSIS — Z8701 Personal history of pneumonia (recurrent): Secondary | ICD-10-CM | POA: Diagnosis not present

## 2014-05-14 DIAGNOSIS — R05 Cough: Secondary | ICD-10-CM | POA: Diagnosis not present

## 2014-05-14 DIAGNOSIS — Z79899 Other long term (current) drug therapy: Secondary | ICD-10-CM | POA: Insufficient documentation

## 2014-05-14 DIAGNOSIS — C3431 Malignant neoplasm of lower lobe, right bronchus or lung: Secondary | ICD-10-CM | POA: Insufficient documentation

## 2014-05-14 DIAGNOSIS — I10 Essential (primary) hypertension: Secondary | ICD-10-CM | POA: Diagnosis not present

## 2014-05-14 DIAGNOSIS — C3491 Malignant neoplasm of unspecified part of right bronchus or lung: Secondary | ICD-10-CM | POA: Diagnosis not present

## 2014-05-14 DIAGNOSIS — R918 Other nonspecific abnormal finding of lung field: Secondary | ICD-10-CM | POA: Diagnosis present

## 2014-05-14 LAB — CBC
HEMATOCRIT: 38.7 % — AB (ref 39.0–52.0)
Hemoglobin: 13.5 g/dL (ref 13.0–17.0)
MCH: 33.6 pg (ref 26.0–34.0)
MCHC: 34.9 g/dL (ref 30.0–36.0)
MCV: 96.3 fL (ref 78.0–100.0)
PLATELETS: 179 10*3/uL (ref 150–400)
RBC: 4.02 MIL/uL — ABNORMAL LOW (ref 4.22–5.81)
RDW: 12.2 % (ref 11.5–15.5)
WBC: 5.2 10*3/uL (ref 4.0–10.5)

## 2014-05-14 LAB — CBC WITH DIFFERENTIAL/PLATELET
Basophils Absolute: 0 10*3/uL (ref 0.0–0.1)
Basophils Relative: 0 % (ref 0–1)
EOS ABS: 0.1 10*3/uL (ref 0.0–0.7)
Eosinophils Relative: 2 % (ref 0–5)
HCT: 40.9 % (ref 39.0–52.0)
HEMOGLOBIN: 14.4 g/dL (ref 13.0–17.0)
LYMPHS ABS: 2.5 10*3/uL (ref 0.7–4.0)
Lymphocytes Relative: 42 % (ref 12–46)
MCH: 34 pg (ref 26.0–34.0)
MCHC: 35.2 g/dL (ref 30.0–36.0)
MCV: 96.7 fL (ref 78.0–100.0)
MONOS PCT: 10 % (ref 3–12)
Monocytes Absolute: 0.6 10*3/uL (ref 0.1–1.0)
NEUTROS PCT: 46 % (ref 43–77)
Neutro Abs: 2.7 10*3/uL (ref 1.7–7.7)
Platelets: 186 10*3/uL (ref 150–400)
RBC: 4.23 MIL/uL (ref 4.22–5.81)
RDW: 12.3 % (ref 11.5–15.5)
WBC: 6 10*3/uL (ref 4.0–10.5)

## 2014-05-14 LAB — BASIC METABOLIC PANEL
Anion gap: 7 (ref 5–15)
BUN: 14 mg/dL (ref 6–23)
CO2: 29 mmol/L (ref 19–32)
CREATININE: 0.94 mg/dL (ref 0.50–1.35)
Calcium: 9.4 mg/dL (ref 8.4–10.5)
Chloride: 101 mmol/L (ref 96–112)
GFR calc Af Amer: >90 mL/min (ref >90)
GFR, EST NON AFRICAN AMERICAN: 84 mL/min — AB (ref >90)
GLUCOSE: 98 mg/dL (ref 70–99)
Potassium: 3.6 mmol/L (ref 3.5–5.1)
Sodium: 137 mmol/L (ref 135–145)

## 2014-05-14 LAB — APTT: APTT: 33 s (ref 24–37)

## 2014-05-14 LAB — LACTIC ACID, PLASMA: LACTIC ACID, VENOUS: 0.8 mmol/L (ref 0.5–2.0)

## 2014-05-14 LAB — PROTIME-INR
INR: 1.1 (ref 0.00–1.49)
Prothrombin Time: 14.3 seconds (ref 11.6–15.2)

## 2014-05-14 MED ORDER — FENTANYL CITRATE 0.05 MG/ML IJ SOLN
INTRAMUSCULAR | Status: AC | PRN
  Administered 2014-05-14: 50 ug via INTRAVENOUS

## 2014-05-14 MED ORDER — LIDOCAINE HCL 1 % IJ SOLN
INTRAMUSCULAR | Status: AC
  Filled 2014-05-14: qty 20

## 2014-05-14 MED ORDER — HYDROCODONE-ACETAMINOPHEN 5-325 MG PO TABS: 1.0000 | ORAL_TABLET | ORAL | Status: DC | PRN

## 2014-05-14 MED ORDER — HYDROCOD POLST-CHLORPHEN POLST 10-8 MG/5ML PO LQCR: 5.0000 mL | Freq: Two times a day (BID) | ORAL | Status: DC

## 2014-05-14 MED ORDER — METOPROLOL SUCCINATE ER 25 MG PO TB24
25.0000 mg | ORAL_TABLET | Freq: Once | ORAL | Status: AC
  Administered 2014-05-14: 25 mg via ORAL

## 2014-05-14 MED ORDER — DILTIAZEM HCL ER COATED BEADS 360 MG PO CP24
360.0000 mg | ORAL_CAPSULE | Freq: Once | ORAL | Status: AC
  Administered 2014-05-14: 360 mg via ORAL

## 2014-05-14 MED ORDER — FENTANYL CITRATE 0.05 MG/ML IJ SOLN
INTRAMUSCULAR | Status: AC
  Filled 2014-05-14: qty 4

## 2014-05-14 MED ORDER — METOPROLOL TARTRATE 25 MG/10 ML ORAL SUSPENSION: 25.0000 mg | Freq: Once | ORAL | Status: DC

## 2014-05-14 MED ORDER — MIDAZOLAM HCL 2 MG/2ML IJ SOLN
INTRAMUSCULAR | Status: AC | PRN
  Administered 2014-05-14 (×2): 1 mg via INTRAVENOUS

## 2014-05-14 MED ORDER — MIDAZOLAM HCL 2 MG/2ML IJ SOLN
INTRAMUSCULAR | Status: AC
  Filled 2014-05-14: qty 4

## 2014-05-14 MED ORDER — DILTIAZEM HCL ER COATED BEADS 360 MG PO CP24: 360.0000 mg | ORAL_CAPSULE | Freq: Once | ORAL | Status: DC

## 2014-05-14 MED ORDER — SODIUM CHLORIDE 0.9 % IV SOLN
INTRAVENOUS | Status: DC
  Administered 2014-05-14: 08:00:00 via INTRAVENOUS

## 2014-05-14 NOTE — Discharge Instructions (Signed)
Needle Biopsy of Lung, Care After °Refer to this sheet in the next few weeks. These instructions provide you with information on caring for yourself after your procedure. Your health care provider may also give you more specific instructions. Your treatment has been planned according to current medical practices, but problems sometimes occur. Call your health care provider if you have any problems or questions after your procedure. °WHAT TO EXPECT AFTER THE PROCEDURE °· A bandage will be applied over the area where the needle was inserted. You may be asked to apply pressure to the bandage for several minutes to ensure there is minimal bleeding. °· In most cases, you can leave when your needle biopsy procedure is completed. Do not drive yourself home. Someone else should take you home. °· If you received an IV sedative or general anesthetic, you will be taken to a comfortable place to relax while the medicine wears off. °· If you have upcoming travel scheduled, talk to your health care provider about when it is safe to travel by air after the procedure. °HOME CARE INSTRUCTIONS °· Expect to take it easy for the rest of the day. °· Protect the area where you received the needle biopsy by keeping the bandage in place for as long as instructed. °· You may feel some mild pain or discomfort in the area, but this should stop in a day or two. °· Take medicines only as directed by your health care provider. °SEEK MEDICAL CARE IF:  °· You have pain at the biopsy site that worsens or is not helped by medicine. °· You have swelling or drainage at the needle biopsy site. °· You have a fever. °SEEK IMMEDIATE MEDICAL CARE IF:  °· You have new or worsening shortness of breath. °· You have chest pain. °· You are coughing up blood. °· You have bleeding that does not stop with pressure or a bandage. °· You develop light-headedness or fainting. °Document Released: 01/16/2007 Document Revised: 08/05/2013 Document Reviewed:  08/13/2012 °[employer]® Patient Information ©2015 [employer]. This information is not intended to replace advice given to you by your health care provider. Make sure you discuss any questions you have with your health care provider. ° °

## 2014-05-14 NOTE — Progress Notes (Signed)
S:  Re-assessment for earlier episode of hemoptysis.  Staff reports coughing episode with approximately 20-30 ml of bright red blood after biopsy.  No further cough or hemoptysis.  Patient denies acute complaints - specifically chest pain, shortness of breath, pain with inspiration, further hemoptysis, cough, lightheadness / dizziness.      O:  Blood pressure 157/89, pulse 64, temperature 97.6 F (36.4 C), temperature source Oral, resp. rate 12, height 6' (1.829 m), weight 241 lb (109.317 kg), SpO2 98 %.   Recent Labs Lab 05/14/14 0730 05/14/14 1030  HGB 14.4 13.5  HCT 40.9 38.7*  WBC 6.0 5.2  PLT 186 179    Exam: General: patient awake, alert, sitting up in bed, no distress Neuro: AAOx4, speech clear, MAE Chest:  Respirations even/non-labored, lungs bilaterally clear, SR on monitor, saturations 98-100% on 2L  A:  Hemoptysis post peripheral RLL lung lesion biopsy Enlarging R lung nodules Hx Lung Cancer   P: Observe in short stay as patient is in no distress Assess CXR to ensure no acute issues Repeat CBC Monitor for further hemoptysis / cough If above within normal limits, reasonable to discharge late this PM with follow up with referring MD for biopsy results  Wean oxygen to off prior to discharge   Noe Gens, NP-C [place] Pgr: [phone] or [phone]  STAFF NOTE: I, Merrie Roof, MD FACP have personally reviewed patient's available data, including medical history, events of note, physical examination and test results as part of my evaluation. I have discussed with resident/NP and other care providers such as pharmacist, RN and RRT. In addition, I personally evaluated patient and elicited key findings of: evaluated in CT scanner, some bright red blood noted, reviewed all CT post procedure, needs further observation , stat pcxr post procedure, cough suppressant, cbc follow up, lactic x 1, O2 sats , goal to dc O2 then mobilize, may be able to  dc at 5-6 pm if improved D/w IR MD  Lavon Paganini. Titus Mould, MD, [place] Pgr: [facility] 05/14/2014 2:33 PM

## 2014-05-14 NOTE — Procedures (Signed)
Post-Procedure Note  Pre-operative Diagnosis: Enlarging right lung nodules, history of right lung cancer        Post-operative Diagnosis: same   Indications: Enlarging right lung nodules and concern for recurrent or new lung cancer.  Procedure Details:   CT guided core biopsies obtained from right lower lung peripheral nodule.  2 cores obtained.  Specimens placed in formalin.   Findings: Peripheral lesion in RLL.  Needle placed in lesion.  Small amount of parenchymal hemorrhage after core biopsies. Intermittent coughing from patient after biopsy.  Started coughing up blood approximately 5 minutes after biopsy but remained hemodynamically stable.  Critical care was consulted and evaluated by Dr. Titus Mould.  Complications: hemoptysis.     Condition: stable  Plan: Admit to Critical care for observation of hemoptysis after right lung biopsy.

## 2014-05-14 NOTE — Progress Notes (Signed)
Patient ID: [name], male   DOB: May 07, 1945, 69 y.o.   MRN: [id number] Patient has NOT had anymore hemoptysis since leaving the CT suite.  No evidence for pneumothorax or significant hemorrhage on CXR.  Patient has been evaluated by Critical Care and they feel like patient is stable and can be observed today and go home later this afternoon.  Will observe patient in Short Stay and plan for discharge around 4 pm if nothing changes.

## 2014-05-14 NOTE — Sedation Documentation (Signed)
Dr Anselm Pancoast in to see pt, CXR ok per Dr Anselm Pancoast.  OK to go to SS. SS called for bed.  No bed at present.

## 2014-05-14 NOTE — H&P (Signed)
Chief Complaint: Enlarging R lung mass  Referring Physician(s): Hendrickson,Steven C  History of Present Illness: [name] is a 69 y.o. 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 Hypertension   . Hypercholesterolemia   . Sinus disease     hx of  . History of pneumonia  last 3-4 yrs ago    3 -4 different times  . Hypothyroidism   . Sleep apnea     uses cpap setting opf 12  . History of agent Orange exposure 44-45 yrs ago  . Complication of anesthesia 2007    quit breathing with bronchoscopy, had to spend night  . lung ca dx'd 2007    lung right    Past Surgical History  Procedure Laterality Date  . Cystourethroscopy, gyrus turp.  04/16/2010  . Torn medial and lateral menisci, left knee.  11/06/2006  . Right video-assisted thoracoscopy, right upper lobectomy and  01/23/2006  . The olympus video bronchoscope was introduced via the right  12/21/2005  . Nasal sinus surgery  1987  . Tonsillectomy  1957  . Colonoscopy with propofol N/A 03/19/2013    Procedure: COLONOSCOPY WITH PROPOFOL;  Surgeon: Garlan Fair, MD;  Location: [facility];  Service: Endoscopy;  Laterality: N/A;    Allergies: Lisinopril  Medications: Prior to Admission medications   Medication Sig Start Date End Date Taking? Authorizing Provider  amitriptyline (ELAVIL) 50 MG tablet Take 50 mg by mouth at bedtime.   Yes Historical Provider, MD  aspirin 81 MG tablet Take 81 mg by mouth every morning.    Yes Historical Provider, MD  cholecalciferol (VITAMIN D) 1000 UNITS tablet Take 1,000 Units by mouth daily.   Yes Historical Provider, MD  diltiazem (CARDIZEM CD) 360 MG 24 hr capsule Take 360 mg by mouth every morning.    Yes Historical Provider, MD  docusate sodium (COLACE) 100 MG capsule Take 100  mg by mouth 2 (two) times daily.   Yes Historical Provider, MD  levETIRAcetam (KEPPRA) 500 MG tablet Take 500 mg by mouth daily.    Yes Historical Provider, MD  Levothyroxine Sodium 50 MCG CAPS Take 50 mcg by mouth daily before breakfast.    Yes Historical Provider, MD  metoCLOPramide (REGLAN) 10 MG tablet Take 10 mg by mouth every 6 (six) hours as needed for nausea.    Yes Historical Provider, MD  metoprolol succinate (TOPROL-XL) 25 MG 24 hr tablet Take 25 mg by mouth every morning.    Yes Historical Provider, MD  Multiple Vitamins-Minerals (CENTRUM SILVER PO) Take 1 tablet by mouth daily.    Yes Historical Provider, MD  naproxen sodium (ANAPROX) 550 MG tablet Take 550 mg by mouth 3 (three) times daily as needed for moderate pain.    Yes Historical Provider, MD  nitroGLYCERIN (NITROSTAT) 0.4 MG SL tablet Place 0.4 mg under the tongue every 5 (five) minutes as needed for chest pain.    Yes Historical Provider, MD  Omega-3 Fat Ac-Cholecalciferol (MINICAPS VITAMIN-D/OMEGA-3) [phone] MG-UNIT CAPS Take 1 tablet by mouth daily.    Yes Historical Provider, MD  psyllium (REGULOID) 0.52 G capsule Take 0.52 g by mouth daily.     Yes Historical Provider, MD  rosuvastatin (CRESTOR) 5 MG tablet Take 5 mg by mouth every morning.    Yes  Historical Provider, MD  valsartan-hydrochlorothiazide (DIOVAN-HCT) 320-25 MG per tablet Take 1 tablet by mouth every morning.    Yes Historical Provider, MD    Family History  Problem Relation Age of Onset  . Hypertension Mother   . Heart attack Father   . Heart attack Brother     History   Social History  . Marital Status: Married    Spouse Name: N/A  . Number of Children: N/A  . Years of Education: N/A   Social History Main Topics  . Smoking status: Never Smoker   . Smokeless tobacco: Never Used  . Alcohol Use: No  . Drug Use: No  . Sexual Activity: Not on file   Other Topics Concern  . Not on file   Social History Narrative     Review of Systems: A  12 point ROS discussed and pertinent positives are indicated in the HPI above.  All other systems are negative.  Review of Systems  Constitutional: Negative for fever, activity change and appetite change.  Respiratory: Negative for cough, choking, chest tightness and shortness of breath.   Cardiovascular: Negative for chest pain.  Gastrointestinal: Negative for abdominal pain.  Musculoskeletal: Negative for back pain.  Neurological: Negative for weakness.  Psychiatric/Behavioral: Negative for behavioral problems and confusion.    Vital Signs: BP 138/82 mmHg  Pulse 71  Temp(Src) 97.6 F (36.4 C) (Oral)  Resp 18  Ht 6' (1.829 m)  Wt 109.317 kg (241 lb)  BMI 32.68 kg/m2  SpO2 99%  Physical Exam  Constitutional: He is oriented to person, place, and time. He appears well-nourished.  Cardiovascular: Normal rate, regular rhythm and normal heart sounds.   No murmur heard. Pulmonary/Chest: Effort normal and breath sounds normal. He has no wheezes.  Abdominal: Soft. Bowel sounds are normal. There is no tenderness.  Musculoskeletal: Normal range of motion.  Neurological: He is alert and oriented to person, place, and time. He has normal reflexes.  Skin: Skin is warm and dry.  Psychiatric: He has a normal mood and affect. His behavior is normal. Judgment and thought content normal.  Nursing note and vitals reviewed.   Imaging: Ct Chest W Contrast  04/22/2014   CLINICAL DATA:  Lung cancer (invasive adenocarcinoma) diagnosed in 2007 status post right upper lobectomy. No current chest complaints. Subsequent encounter.  EXAM: CT CHEST WITH CONTRAST  TECHNIQUE: Multidetector CT imaging of the chest was performed during intravenous contrast administration.  CONTRAST:  46mL OMNIPAQUE IOHEXOL 300 MG/ML  SOLN  COMPARISON:  Chest CT 10/22/2013, 04/23/2013 and 04/24/2012.  FINDINGS: Mediastinum: There are no enlarged mediastinal or hilar lymph nodes. There is a stable small nodule inferiorly in the  thyroid isthmus. The trachea and esophagus demonstrate no significant findings. The heart size is normal. There is no pericardial effusion.There are no significant vascular findings.  Lungs/Pleura: There is no pleural effusion.There are stable postsurgical changes status post right upper lobe resection. The right middle lobe remains clear. There are 2 complex sub solid lesions anteriorly in the right lower lobe. The dominant lesion has an irregular V-shaped morphology. Because of its irregular shape, measurements are difficult, but on axial image 18, the lesion measures 2.2 x 1.3 cm compared with 2.1 x 1.2 cm on the most recent study. Based on serial examinations, this lesion is slowly enlarging. The other sub solid nodule more inferiorly in the right lower lobe has also enlarged, measuring 10 mm on image 22 (most recently 8 mm). There is superior ground-glass density inferiorly in the  right lower lobe on image number 30 which is grossly stable. The left lung is clear.  Upper abdomen: 3.1 cm peripherally calcified gallstone again noted. No adrenal mass or abnormality within the visualized Liver demonstrated. There is stable elevation of the right hemidiaphragm.  Musculoskeletal/Chest wall: There is no axillary adenopathy or chest wall mass. No worrisome osseous findings.  IMPRESSION: 1. Continued slow growth of sub solid right lower lobe nodules status post right upper lobe resection for adenocarcinoma. These findings appearing concerning for recurrent invasive adenocarcinoma. PET-CT recommended for further evaluation. 2. No adenopathy or metastatic disease identified. 3. Cholelithiasis.   Electronically Signed   By: Camie Patience M.D.   On: 04/22/2014 09:16   Nm Pet Image Restag (ps) Skull Base To Thigh  05/07/2014   ADDENDUM REPORT: 05/07/2014 18:38  ADDENDUM: The following is added to the impression section of the original report:  3. Mild focal hypermetabolism in the prostate has an SUV max of 4.5. Correlation  with PSA is recommended.   Electronically Signed   By: Lorin Picket M.D.   On: 05/07/2014 18:38   05/07/2014   CLINICAL DATA:  Subsequent treatment strategy for lung cancer.  EXAM: NUCLEAR MEDICINE PET SKULL BASE TO THIGH  TECHNIQUE: 13.4 mCi F-18 FDG was injected intravenously. Full-ring PET imaging was performed from the skull base to thigh after the radiotracer. CT data was obtained and used for attenuation correction and anatomic localization.  FASTING BLOOD GLUCOSE:  Value: 102 mg/dl  COMPARISON:  CT chest 04/22/2014, 10/22/2013 and 05/01/2013 and PET 06/25/2010.  FINDINGS: NECK  No hypermetabolic lymph nodes in the neck. CT images show no acute findings.  CHEST  A bilobed nodular lesion in the right lower lobe collectively measures 1.4 x 4.0 cm (series 8, image 23) with an SUV max of 2.3. Although the overall size measurements are not significantly changed from 10/23/2012, the lesion has grown from 06/25/2010, at which time it was seen as 2 smaller separate nodules. A 9 x 14 mm nodule in the adjacent right lower lobe (CT image 26, series 8) has enlarged from 6 x 6 mm on 10/23/2012. Lesion is borderline in size for PET resolution. No hypermetabolic mediastinal, hilar or axillary lymph nodes. CT images show no acute findings. No pericardial or pleural effusion. Right upper lobectomy.  ABDOMEN/PELVIS  No abnormal hypermetabolism in the liver, adrenal glands, spleen or pancreas. No hypermetabolic lymph nodes. CT images show the liver to be grossly unremarkable. A 3.0 cm stone is seen in the gallbladder. Adrenal glands are unremarkable. 11 mm low-attenuation lesion off the the right kidney is likely stable and therefore a cyst. Kidneys, spleen, pancreas, stomach and bowel are otherwise grossly unremarkable. No free fluid.  SKELETON  No abnormal osseous hypermetabolism.  IMPRESSION: 1. Mildly hypermetabolic bilobed nodule in the right lower lobe, worrisome for low-grade adenocarcinoma. Adjacent nodule has  enlarged from 10/23/2012 and is also worrisome for adenocarcinoma. 2. Cholelithiasis.  Electronically Signed: By: Lorin Picket M.D. On: 05/07/2014 13:44    Labs:  CBC:  Recent Labs  10/22/13 0755 04/22/14 0753 05/14/14 0730  WBC 5.9 6.3 6.0  HGB 13.8 14.1 14.4  HCT 39.1 40.3 40.9  PLT 185 191 186    COAGS: No results for input(s): INR, APTT in the last 8760 hours.  BMP:  Recent Labs  10/22/13 0755 04/22/14 0753  NA 138 139  K 3.6 3.7  CO2 26 28  GLUCOSE 104 99  BUN 13.6 14.1  CALCIUM 9.4 8.9  CREATININE  1.0 0.9    LIVER FUNCTION TESTS:  Recent Labs  10/22/13 0755 04/22/14 0753  BILITOT 0.38 0.27  AST 22 25  ALT 25 29  ALKPHOS 52 55  PROT 6.5 6.4  ALBUMIN 3.9 4.0    TUMOR MARKERS: No results for input(s): AFPTM, CEA, CA199, CHROMGRNA in the last 8760 hours.  Assessment and Plan:  Hx NSC Ca- RUL lobectomy 2007 Enlarging RLL mass; +PET Now for bx Pt aware of procedure benefits and risks including but limited to: Infection; bleeding; pneumothorax; death Agreeable to proceed Consent signed andin chart  Thank you for this interesting consult.  I greatly enjoyed meeting [name] and look forward to participating in their care.  Signed: TURPIN,PAMELA A 05/14/2014, 8:26 AM   I spent a total of 20 minutes face to face in clinical consultation, greater than 50% of which was counseling/coordinating care for R lung mass bx

## 2014-05-14 NOTE — Sedation Documentation (Signed)
Pt. Started mildly coughing. Some bright red blood evident . CCM consulted.  Pt. Admitted for safe observation. Pt. Currently at Rad. Nurses station waiting for bed assignment. Pt. VVS. O2 sats 96% on 2L

## 2014-05-16 ENCOUNTER — Ambulatory Visit
Admission: RE | Admit: 2014-05-16 | Discharge: 2014-05-16 | Disposition: A | Discharge status: Home / Self Care | Payer: Medicare Other | Source: Ambulatory Visit | Attending: Internal Medicine | Admitting: Internal Medicine

## 2014-05-16 ENCOUNTER — Telehealth: Payer: Self-pay | Admitting: *Deleted

## 2014-05-16 DIAGNOSIS — Z136 Encounter for screening for cardiovascular disorders: Secondary | ICD-10-CM | POA: Diagnosis not present

## 2014-05-16 DIAGNOSIS — I714 Abdominal aortic aneurysm, without rupture, unspecified: Secondary | ICD-10-CM

## 2014-05-16 NOTE — Telephone Encounter (Signed)
I called to follow up from thoracic clinic on 05/08/14.  I left my name and phone number to call if needed.

## 2014-05-19 ENCOUNTER — Telehealth: Payer: Self-pay | Admitting: *Deleted

## 2014-05-20 ENCOUNTER — Encounter: Payer: Self-pay | Admitting: *Deleted

## 2014-05-20 ENCOUNTER — Other Ambulatory Visit: Payer: Self-pay | Admitting: *Deleted

## 2014-05-20 ENCOUNTER — Ambulatory Visit: Payer: Medicare Other | Admitting: Thoracic Surgery (Cardiothoracic Vascular Surgery)

## 2014-05-20 ENCOUNTER — Telehealth: Payer: Self-pay | Admitting: Internal Medicine

## 2014-05-20 ENCOUNTER — Encounter: Payer: Self-pay | Admitting: Thoracic Surgery (Cardiothoracic Vascular Surgery)

## 2014-05-20 ENCOUNTER — Ambulatory Visit (INDEPENDENT_AMBULATORY_CARE_PROVIDER_SITE_OTHER): Payer: Medicare Other | Admitting: Thoracic Surgery (Cardiothoracic Vascular Surgery)

## 2014-05-20 VITALS — BP 138/90 | HR 91 | Resp 16 | Ht 72.0 in | Wt 240.0 lb

## 2014-05-20 DIAGNOSIS — Z9889 Other specified postprocedural states: Secondary | ICD-10-CM | POA: Diagnosis not present

## 2014-05-20 DIAGNOSIS — I2510 Atherosclerotic heart disease of native coronary artery without angina pectoris: Secondary | ICD-10-CM | POA: Diagnosis not present

## 2014-05-20 DIAGNOSIS — C3491 Malignant neoplasm of unspecified part of right bronchus or lung: Secondary | ICD-10-CM

## 2014-05-20 DIAGNOSIS — Z85118 Personal history of other malignant neoplasm of bronchus and lung: Secondary | ICD-10-CM

## 2014-05-20 DIAGNOSIS — C3431 Malignant neoplasm of lower lobe, right bronchus or lung: Secondary | ICD-10-CM

## 2014-05-20 NOTE — Progress Notes (Signed)
HPI:  [name] returns today to discuss the results of the CT-guided needle biopsy.  He is a 69 year old nonsmoker who had a right upper lobectomy for stage IA non-small cell carcinoma back in 2007. Over the past several years he has been followed with multiple lung nodules in the right lung. There is not much difference from scan the scan but the nodules have increased in size slowly over a period of several years.  He recently had a repeat CT. The nodules had grown by about 1 mm over 6 months. A PET/CT was done which revealed the larger to be mildly hypermetabolic.  After discussion he wished to have a needle biopsy to determine if this was cancer. The biopsy was done last week he now returns to discuss the results.  Past Medical History  Diagnosis Date  . Hypertension   . Hypercholesterolemia   . Sinus disease     hx of  . History of pneumonia  last 3-4 yrs ago    3 -4 different times  . Hypothyroidism   . Sleep apnea     uses cpap setting opf 12  . History of agent Orange exposure 44-45 yrs ago  . Complication of anesthesia 2007    quit breathing with bronchoscopy, had to spend night  . lung ca dx'd 2007    lung right     Current Outpatient Prescriptions  Medication Sig Dispense Refill  . amitriptyline (ELAVIL) 50 MG tablet Take 50 mg by mouth at bedtime.    [name] aspirin 81 MG tablet Take 81 mg by mouth every morning.     . cholecalciferol (VITAMIN D) 1000 UNITS tablet Take 1,000 Units by mouth daily.    [name] diltiazem (CARDIZEM CD) 360 MG 24 hr capsule Take 360 mg by mouth every morning.     . docusate sodium (COLACE) 100 MG capsule Take 100 mg by mouth 2 (two) times daily.    [name] levETIRAcetam (KEPPRA) 500 MG tablet Take 750 mg by mouth daily.     . Levothyroxine Sodium 50 MCG CAPS Take 75 mcg by mouth daily before breakfast.     . metoCLOPramide (REGLAN) 10 MG tablet Take 10 mg by mouth every 6 (six) hours as needed for nausea.     . metoprolol succinate (TOPROL-XL) 25 MG 24  hr tablet Take 25 mg by mouth every morning.     . Multiple Vitamins-Minerals (CENTRUM SILVER PO) Take 1 tablet by mouth daily.     . naproxen sodium (ANAPROX) 550 MG tablet Take 550 mg by mouth 3 (three) times daily as needed for moderate pain.     . nitroGLYCERIN (NITROSTAT) 0.4 MG SL tablet Place 0.4 mg under the tongue every 5 (five) minutes as needed for chest pain.     . Omega-3 Fat Ac-Cholecalciferol (MINICAPS VITAMIN-D/OMEGA-3) [phone] MG-UNIT CAPS Take 1 tablet by mouth daily.     . psyllium (REGULOID) 0.52 G capsule Take 0.52 g by mouth daily.      . rosuvastatin (CRESTOR) 5 MG tablet Take 5 mg by mouth every morning.     . valsartan-hydrochlorothiazide (DIOVAN-HCT) 320-25 MG per tablet Take 1 tablet by mouth every morning.      No current facility-administered medications for this visit.    Physical Exam BP 138/90 mmHg  Pulse 91  Resp 16  Ht 6' (1.829 m)  Wt 240 lb (108.863 kg)  BMI 32.54 kg/m2  SpO19 23% 70 year old man in no acute distress Well-developed well-nourished Alert and oriented with  no focal neurologic deficits   Diagnosis Lung, needle/core biopsy(ies), right lower lobe - ADENOCARCINOMA. - SEE COMMENT. Microscopic Comment The carcinoma appears well-differentiated. There is likely sufficient tissue remaining for additional studies, if requested. Dr. Roxan Hockey was paged on 05/15/2014. (JBK:ds 05/15/14) Andrew Cutter MD Pathologist, Electronic Signature  Impression: 69 year old man who is a lifelong nonsmoker with a history of a stage IA non-small cell carcinoma the right upper lobe. This was resected back in 2007. He has had an unusual appearing right lower lobe groundglass opacity. This is a multiloculated opacity and also appears to have some component running along the bronchus. This has very slowly changed in size over a period of several years. Biopsy now shows that it is a well-differentiated adenocarcinoma. This is consistent with a new primary, rather  than recurrent or metastatic disease.  I discussed treatment options in detail with [name]. We reviewed the CT and PET images and discussed the difficulties with a conservative surgical resection. It would be difficult, if not impossible, to get an adequate margin with a segmentectomy. I don't think a wedge resection will give an adequate margin. Given his prior surgery this would be a difficult operation technically. There also is a reasonably high chance that we would be forced to go ahead with a completion pneumonectomy. He would need pulmonary function testing prior to surgery to see if he could tolerate that resection.  He is understandably reluctant to undergo redo surgery. He had questions about the possibility of chemotherapy and radiation therapy. I think it certainly appropriate to address those issues before making a final decision. He will need genetic testing to see if any of the newer oral chemotherapy agents would be appropriate. I think based on looking at the films at this would be a difficult lesion to treat with radiation, but would obviously defer to radiation oncology's assessment. I did caution him that if he was treated with aggressive radiation, it could severely limit our surgical options, and likely would preclude the possibility of doing a pneumonectomy.  I will arrange for him to see Dr. [name] and one of the radiation oncologists to discuss options. I would not rule out any of these modes of treatment at the current time. He does need some time to think over the advantages and disadvantages of each approach before making a decision.  Plan: Appointment with oncology and radiation oncology in [address] this week.  We will discuss the case at our multidisciplinary conference.  I spent 30 minutes with [name] discussing the results, reviewing the films, and discussing treatment options.  Revonda Standard Roxan Hockey, MD [facility] [phone]

## 2014-05-20 NOTE — Telephone Encounter (Signed)
s.w. pt and advised on Feb appt....pt ok and aware

## 2014-05-20 NOTE — Progress Notes (Deleted)
PCP is Henrine Screws, MD Referring Provider is Curt Bears, MD  Chief Complaint  Patient presents with  . Follow-up    s/p CT Bx. discuss results  . Lung Lesion    HPI:   Past Medical History  Diagnosis Date  . Hypertension   . Hypercholesterolemia   . Sinus disease     hx of  . History of pneumonia  last 3-4 yrs ago    3 -4 different times  . Hypothyroidism   . Sleep apnea     uses cpap setting opf 12  . History of agent Orange exposure 44-45 yrs ago  . Complication of anesthesia 2007    quit breathing with bronchoscopy, had to spend night  . lung ca dx'd 2007    lung right    Past Surgical History  Procedure Laterality Date  . Cystourethroscopy, gyrus turp.  04/16/2010  . Torn medial and lateral menisci, left knee.  11/06/2006  . Right video-assisted thoracoscopy, right upper lobectomy and  01/23/2006  . The olympus video bronchoscope was introduced via the right  12/21/2005  . Nasal sinus surgery  1987  . Tonsillectomy  1957  . Colonoscopy with propofol N/A 03/19/2013    Procedure: COLONOSCOPY WITH PROPOFOL;  Surgeon: Garlan Fair, MD;  Location: [facility];  Service: Endoscopy;  Laterality: N/A;    Family History  Problem Relation Age of Onset  . Hypertension Mother   . Heart attack Father   . Heart attack Brother     Social History History  Substance Use Topics  . Smoking status: Never Smoker   . Smokeless tobacco: Never Used  . Alcohol Use: No    Current Outpatient Prescriptions  Medication Sig Dispense Refill  . amitriptyline (ELAVIL) 50 MG tablet Take 50 mg by mouth at bedtime.    [name] aspirin 81 MG tablet Take 81 mg by mouth every morning.     . cholecalciferol (VITAMIN D) 1000 UNITS tablet Take 1,000 Units by mouth daily.    [name] diltiazem (CARDIZEM CD) 360 MG 24 hr capsule Take 360 mg by mouth every morning.     . docusate sodium (COLACE) 100 MG capsule Take 100 mg by mouth 2 (two) times daily.    [name] levETIRAcetam (KEPPRA) 500 MG  tablet Take 500 mg by mouth daily.     . Levothyroxine Sodium 50 MCG CAPS Take 50 mcg by mouth daily before breakfast.     . metoCLOPramide (REGLAN) 10 MG tablet Take 10 mg by mouth every 6 (six) hours as needed for nausea.     . metoprolol succinate (TOPROL-XL) 25 MG 24 hr tablet Take 25 mg by mouth every morning.     . Multiple Vitamins-Minerals (CENTRUM SILVER PO) Take 1 tablet by mouth daily.     . naproxen sodium (ANAPROX) 550 MG tablet Take 550 mg by mouth 3 (three) times daily as needed for moderate pain.     . nitroGLYCERIN (NITROSTAT) 0.4 MG SL tablet Place 0.4 mg under the tongue every 5 (five) minutes as needed for chest pain.     . Omega-3 Fat Ac-Cholecalciferol (MINICAPS VITAMIN-D/OMEGA-3) [phone] MG-UNIT CAPS Take 1 tablet by mouth daily.     . psyllium (REGULOID) 0.52 G capsule Take 0.52 g by mouth daily.      . rosuvastatin (CRESTOR) 5 MG tablet Take 5 mg by mouth every morning.     . valsartan-hydrochlorothiazide (DIOVAN-HCT) 320-25 MG per tablet Take 1 tablet by mouth every morning.  No current facility-administered medications for this visit.    Allergies  Allergen Reactions  . Lisinopril Cough    cough    Review of Systems  Ht 6' (1.829 m)  Wt 240 lb (108.863 kg)  BMI 32.54 kg/m2  SpO2  Physical Exam   Diagnostic Tests:   Impression:   Plan:

## 2014-05-20 NOTE — [facility] Oncology Navigator Note (Unsigned)
Received phone call from [facility].  [name] needs to be seen by Rad Onc and Med Onc.  I will arrange appt.

## 2014-05-22 ENCOUNTER — Other Ambulatory Visit: Payer: Self-pay | Admitting: *Deleted

## 2014-05-26 NOTE — Progress Notes (Signed)
Thoracic Location of Tumor / Histology:Right lung.  Patient presented :Seen over several years with ct chest scans for multiple lung nodules in right lung with recent growth of nodule about 1 mm in 6 months.   Biopsies of :05/14/2014 Right lung nodule reveals adenocarcinoma that is well-differentiated.  Tobacco/Marijuana/Snuff/ETOH use:Non-smoker, exposure to agent orange 40 to 45 years ago.   Past/Anticipated interventions by cardiothoracic surgery, if any: Wedge resection would be difficult surgery to perform.Consider pneumonectomy which patient is reluctant to do . Right upper lobectomy for stage IA non-small cell lung cancer  in 2007  Past/Anticipated interventions by medical oncology, if TYO:MAYOKHTX made to Dr.Mohamed  Signs/Symptoms  Weight changes, if any: No, has a good appetite  Respiratory complaints, if HFS:FSELT apnea, uses cpap.  Hemoptysis, if any:No  Pain issues, if any:No  SAFETY ISSUES:  Prior radiation?No  Pacemaker/ICD? No  Possible current pregnancy?N/A  Is the patient on methotrexate? No  Current Complaints / other details:Married.Patient wants to get information on if chemotherapy or radiation are options prior to making decision about surgery.He is asymptomatic, affect is upbeat and appears to be relatively healthy.  Allergies:lisinipril

## 2014-05-27 ENCOUNTER — Encounter (HOSPITAL_COMMUNITY): Payer: Self-pay

## 2014-05-28 ENCOUNTER — Encounter: Payer: Self-pay | Admitting: Internal Medicine

## 2014-05-28 ENCOUNTER — Other Ambulatory Visit: Payer: Self-pay | Admitting: Medical Oncology

## 2014-05-28 ENCOUNTER — Ambulatory Visit
Admission: RE | Admit: 2014-05-28 | Discharge: 2014-05-28 | Disposition: A | Discharge status: Home / Self Care | Payer: Medicare Other | Source: Ambulatory Visit | Attending: Radiation Oncology | Admitting: Radiation Oncology

## 2014-05-28 ENCOUNTER — Telehealth: Payer: Self-pay | Admitting: Internal Medicine

## 2014-05-28 ENCOUNTER — Encounter: Payer: Self-pay | Admitting: Radiation Oncology

## 2014-05-28 ENCOUNTER — Ambulatory Visit (HOSPITAL_BASED_OUTPATIENT_CLINIC_OR_DEPARTMENT_OTHER): Payer: Medicare Other | Admitting: Internal Medicine

## 2014-05-28 VITALS — BP 131/76 | HR 72 | Temp 97.8°F | Resp 20 | Wt 246.1 lb

## 2014-05-28 DIAGNOSIS — C3431 Malignant neoplasm of lower lobe, right bronchus or lung: Secondary | ICD-10-CM

## 2014-05-28 DIAGNOSIS — Z85118 Personal history of other malignant neoplasm of bronchus and lung: Secondary | ICD-10-CM | POA: Diagnosis not present

## 2014-05-28 DIAGNOSIS — Z902 Acquired absence of lung [part of]: Secondary | ICD-10-CM | POA: Diagnosis not present

## 2014-05-28 NOTE — Telephone Encounter (Signed)
added appt per pof....pt ok and aware

## 2014-05-28 NOTE — Progress Notes (Signed)
Please see the Nurse Progress Note in the MD Initial Consult Encounter for this patient. 

## 2014-05-28 NOTE — Progress Notes (Signed)
[facility] [phone]   [phone]  OFFICE PROGRESS NOTE  [name],[name], MD [address] 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 Roxan Hockey on 01/23/2006.   CURRENT THERAPY: Observation.  DISEASE STAGE: Recurrent  CHEMOTHERAPY INTENT: Palliative.  CURRENT # OF CHEMOTHERAPY CYCLES: 0  CURRENT ANTIEMETICS: None  CURRENT SMOKING STATUS: Non-smoker  ORAL CHEMOTHERAPY AND CONSENT: None  CURRENT BISPHOSPHONATES USE: None  LIVING WILL AND CODE STATUS: Full code.  INTERVAL HISTORY: [name] 69 y.o. male returns to the clinic today for followup visit accompanied by his wife. The patient is feeling fine today with no specific complaints. He denied having any significant chest pain, shortness of breath, cough or hemoptysis. The patient denied having any weight loss or night sweats. He has no fever or chills, nausea or vomiting. His recent CT scan of the chest as well as PET scan showed evidence for disease recurrence in the right lung. The patient underwent CT-guided biopsy of the right lower lobe lung nodule by interventional radiology on 05/14/2014 and the final pathology (Accession: [id number]) showed well-differentiated adenocarcinoma. The tissue block was submitted for molecular testing and the final report showed positive EGFR mutation in exon 21 (L858R). The patient is here today for evaluation and recommendation regarding treatment of his recurrent non-small cell lung cancer. He is not a good candidate for surgical resection of his lesions. He is feeling fine today with no specific complaints. He denied having any  significant chest pain, shortness of breath, cough or hemoptysis. The patient denied having any fever or chills, no nausea or vomiting. He denied having any significant weight loss or night sweats.  MEDICAL HISTORY: Past Medical History  Diagnosis Date  . Hypertension   . Hypercholesterolemia   . Sinus disease     hx of  . History of pneumonia  last 3-4 yrs ago    3 -4 different times  . Hypothyroidism   . Sleep apnea     uses cpap setting opf 12  . History of agent Orange exposure 44-45 yrs ago  . Complication of anesthesia 2007    quit breathing with bronchoscopy, had to spend night  . lung ca dx'd 2007    lung right    ALLERGIES:  is allergic to lisinopril.  MEDICATIONS:  Current Outpatient Prescriptions  Medication Sig Dispense Refill  . amitriptyline (ELAVIL) 50 MG tablet Take 50 mg by mouth at bedtime.    [name] aspirin 81 MG tablet Take 81 mg by mouth every morning.     . cholecalciferol (VITAMIN D) 1000 UNITS tablet Take 1,000 Units by mouth daily.    [name] diltiazem (CARDIZEM CD) 360 MG 24 hr capsule Take 360 mg by mouth every morning.     . docusate sodium (COLACE) 100 MG capsule Take 100 mg by mouth 2 (two) times daily.    [name] levETIRAcetam (KEPPRA) 500 MG tablet Take 750 mg by mouth daily.     . Levothyroxine Sodium 50 MCG CAPS Take 75 mcg by mouth daily before breakfast.     . metoCLOPramide (REGLAN) 10 MG tablet Take 10 mg by mouth every 6 (six) hours as needed for nausea.     [name]  metoprolol succinate (TOPROL-XL) 25 MG 24 hr tablet Take 25 mg by mouth every morning.     . Multiple Vitamins-Minerals (CENTRUM SILVER PO) Take 1 tablet by mouth daily.     . naproxen sodium (ANAPROX) 550 MG tablet Take 550 mg by mouth 3 (three) times daily as needed for moderate pain.     . nitroGLYCERIN (NITROSTAT) 0.4 MG SL tablet Place 0.4 mg under the tongue every 5 (five) minutes as needed for chest pain.     . Omega-3 Fat Ac-Cholecalciferol (MINICAPS VITAMIN-D/OMEGA-3) [phone] MG-UNIT CAPS  Take 1 tablet by mouth daily.     . psyllium (REGULOID) 0.52 G capsule Take 0.52 g by mouth daily.      . rosuvastatin (CRESTOR) 5 MG tablet Take 5 mg by mouth every morning.     . valsartan-hydrochlorothiazide (DIOVAN-HCT) 320-25 MG per tablet Take 1 tablet by mouth every morning.      No current facility-administered medications for this visit.    SURGICAL HISTORY:  Past Surgical History  Procedure Laterality Date  . Cystourethroscopy, gyrus turp.  04/16/2010  . Torn medial and lateral menisci, left knee.  11/06/2006  . Right video-assisted thoracoscopy, right upper lobectomy and  01/23/2006  . The olympus video bronchoscope was introduced via the right  12/21/2005  . Nasal sinus surgery  1987  . Tonsillectomy  1957  . Colonoscopy with propofol N/A 03/19/2013    Procedure: COLONOSCOPY WITH PROPOFOL;  Surgeon: Garlan Fair, MD;  Location: [facility];  Service: Endoscopy;  Laterality: N/A;    REVIEW OF SYSTEMS:  Constitutional: negative Eyes: negative Ears, nose, mouth, throat, and face: negative Respiratory: negative Cardiovascular: negative Gastrointestinal: negative Genitourinary:negative Integument/breast: negative Hematologic/lymphatic: negative Musculoskeletal:negative Neurological: negative Behavioral/Psych: negative Endocrine: negative Allergic/Immunologic: negative   PHYSICAL EXAMINATION: General appearance: alert, cooperative and no distress Head: Normocephalic, without obvious abnormality, atraumatic Neck: no adenopathy Lymph nodes: Cervical, supraclavicular, and axillary nodes normal. Resp: clear to auscultation bilaterally Back: symmetric, no curvature. ROM normal. No CVA tenderness. Cardio: regular rate and rhythm, S1, S2 normal, no murmur, click, rub or gallop GI: soft, non-tender; bowel sounds normal; no masses,  no organomegaly Extremities: extremities normal, atraumatic, no cyanosis or edema Neurologic: Alert and oriented X 3, normal strength and  tone. Normal symmetric reflexes. Normal coordination and gait  ECOG PERFORMANCE STATUS: 0 - Asymptomatic  There were no vitals taken for this visit.  LABORATORY DATA: Lab Results  Component Value Date   WBC 5.2 05/14/2014   HGB 13.5 05/14/2014   HCT 38.7* 05/14/2014   MCV 96.3 05/14/2014   PLT 179 05/14/2014      Chemistry      Component Value Date/Time   NA 137 05/14/2014 0730   NA 139 04/22/2014 0753   NA 139 10/20/2011 0903   K 3.6 05/14/2014 0730   K 3.7 04/22/2014 0753   K 3.4 10/20/2011 0903   CL 101 05/14/2014 0730   CL 102 04/24/2012 0853   CL 101 10/20/2011 0903   CO2 29 05/14/2014 0730   CO2 28 04/22/2014 0753   CO2 29 10/20/2011 0903   BUN 14 05/14/2014 0730   BUN 14.1 04/22/2014 0753   BUN 12 10/20/2011 0903   CREATININE 0.94 05/14/2014 0730   CREATININE 0.9 04/22/2014 0753   CREATININE 0.9 10/20/2011 0903      Component Value Date/Time   CALCIUM 9.4 05/14/2014 0730   CALCIUM 8.9 04/22/2014 0753   CALCIUM 9.0 10/20/2011 0903   ALKPHOS 55 04/22/2014 0753  ALKPHOS 52 10/20/2011 0903   ALKPHOS 53 06/25/2010 0758   AST 25 04/22/2014 0753   AST 24 10/20/2011 0903   AST 22 06/25/2010 0758   ALT 29 04/22/2014 0753   ALT 31 10/20/2011 0903   ALT 22 06/25/2010 0758   BILITOT 0.27 04/22/2014 0753   BILITOT 0.60 10/20/2011 0903   BILITOT 0.3 06/25/2010 0758       RADIOGRAPHIC STUDIES: Dg Chest 1 View  05/14/2014   CLINICAL DATA:  Cough with hemoptysis.  Right lung biopsy.  EXAM: CHEST  1 VIEW  COMPARISON:  Lung biopsy CT 05/14/2014  FINDINGS: No evidence of air leak or significant alveolar hemorrhage surrounding a biopsied nodule in the right lung.  Stable elevation of the right diaphragm post right pulmonary resection. Heart size and aortic contours are stable. The left lung is clear.  IMPRESSION: No evidence of air leak or alveolar hemorrhage post right lung biopsy.   Electronically Signed   By: Monte Fantasia M.D.   On: 05/14/2014 11:12   Nm Pet  Image Restag (ps) Skull Base To Thigh  05/07/2014   ADDENDUM REPORT: 05/07/2014 18:38  ADDENDUM: The following is added to the impression section of the original report:  3. Mild focal hypermetabolism in the prostate has an SUV max of 4.5. Correlation with PSA is recommended.   Electronically Signed   By: Lorin Picket M.D.   On: 05/07/2014 18:38   05/07/2014   CLINICAL DATA:  Subsequent treatment strategy for lung cancer.  EXAM: NUCLEAR MEDICINE PET SKULL BASE TO THIGH  TECHNIQUE: 13.4 mCi F-18 FDG was injected intravenously. Full-ring PET imaging was performed from the skull base to thigh after the radiotracer. CT data was obtained and used for attenuation correction and anatomic localization.  FASTING BLOOD GLUCOSE:  Value: 102 mg/dl  COMPARISON:  CT chest 04/22/2014, 10/22/2013 and 05/01/2013 and PET 06/25/2010.  FINDINGS: NECK  No hypermetabolic lymph nodes in the neck. CT images show no acute findings.  CHEST  A bilobed nodular lesion in the right lower lobe collectively measures 1.4 x 4.0 cm (series 8, image 23) with an SUV max of 2.3. Although the overall size measurements are not significantly changed from 10/23/2012, the lesion has grown from 06/25/2010, at which time it was seen as 2 smaller separate nodules. A 9 x 14 mm nodule in the adjacent right lower lobe (CT image 26, series 8) has enlarged from 6 x 6 mm on 10/23/2012. Lesion is borderline in size for PET resolution. No hypermetabolic mediastinal, hilar or axillary lymph nodes. CT images show no acute findings. No pericardial or pleural effusion. Right upper lobectomy.  ABDOMEN/PELVIS  No abnormal hypermetabolism in the liver, adrenal glands, spleen or pancreas. No hypermetabolic lymph nodes. CT images show the liver to be grossly unremarkable. A 3.0 cm stone is seen in the gallbladder. Adrenal glands are unremarkable. 11 mm low-attenuation lesion off the the right kidney is likely stable and therefore a cyst. Kidneys, spleen, pancreas, stomach  and bowel are otherwise grossly unremarkable. No free fluid.  SKELETON  No abnormal osseous hypermetabolism.  IMPRESSION: 1. Mildly hypermetabolic bilobed nodule in the right lower lobe, worrisome for low-grade adenocarcinoma. Adjacent nodule has enlarged from 10/23/2012 and is also worrisome for adenocarcinoma. 2. Cholelithiasis.  Electronically Signed: By: Lorin Picket M.D. On: 05/07/2014 13:44   US Aorta  05/16/2014   CLINICAL DATA:  Screening; no hx of AAA  EXAM: ULTRASOUND OF ABDOMINAL AORTA  TECHNIQUE: Ultrasound examination of the abdominal aorta was  performed to evaluate for abdominal aortic aneurysm.  COMPARISON:  None  FINDINGS: Abdominal Aorta  No aneurysm identified. The aorta exhibits a normal tapering caliber. The maximal dimensions are in the proximal aorta. The common iliac arteries measure 1.3 cm in diameter.  Maximum AP  Diameter:  2.8 cm  Maximum TRV  Diameter:  3.0 cm  IMPRESSION: There is no abdominal aortic aneurysm.   Electronically Signed   By: David  Martinique   On: 05/16/2014 13:09   Ct Biopsy  05/14/2014   CLINICAL DATA:  69 year old with history of a non-small cell cancer in the right lung and status post resection. Patient has slowly enlarging pulmonary nodules in the right lower lobe and tissue diagnosis is needed.  EXAM: CT GUIDED BIOPSY OF RIGHT LOWER LOBE LUNG NODULE  Physician: Stephan Minister. Henn, MD  MEDICATIONS: 2 mg Versed, 50 mcg fentanyl. A radiology nurse monitored the patient for moderate sedation.  ANESTHESIA/SEDATION: Sedation time: 30 minutes  PROCEDURE: The procedure was explained to the patient. The risks and benefits of the procedure were discussed and the patient's questions were addressed. Specifically, the risks of pneumothorax, hemoptysis and even death were discussed with the patient. Informed consent was obtained from the patient.  The patient was placed supine on the CT scanner. Images through the chest were obtained. The peripheral nodule in the right lower lobe  was targeted. The right upper chest was prepped with Betadine and sterile field was created. 1% lidocaine was used for local anesthetic. A 17 gauge needle was directed into the peripheral lesion with CT guidance. Needle position was confirmed within the lesion. Total of 2 core biopsies were obtained with an 18 gauge device. The 17 gauge needle was removed. Follow up CT images demonstrate a small amount of parenchymal hemorrhage at the biopsy site. Patient was immediately placed on his right side. Patient had intermittent coughing immediately following the procedure. Approximately 5-10 minutes following the biopsies, the patient started to cough up red blood. The patient's oxygen saturations remained above 97% and he was hemodynamically stable. At this time, Critical Care was consulted. The patient remained hemodynamically stable and was evaluated by Dr. Titus Mould. Initially, we planned to admit the patient for overnight observation. Fortunately, the patient had no more episodes of hemoptysis after leaving the CT suite. The patient remained asymptomatic. Follow-up chest radiograph was negative for a pneumothorax or significant parenchymal hemorrhage. Critical Care felt comfortable having the patient observed in Short Stay and planning discharge later today.  FINDINGS: Irregular pulmonary lesions in the right lower lobe. The elongated and peripheral lesion in the right lower lobe was targeted. Needle position was confirmed within the lesion. Two core biopsies were obtained. The core biopsies appeared to be adequate and the samples were placed in formalin. Small amount of parenchymal hemorrhage at the biopsy site immediately following the procedure. No evidence for pneumothorax.  COMPLICATIONS: SIR level A: No therapy, no consequence.  Post biopsy hemoptysis.  IMPRESSION: CT-guided core biopsies of a right lower lobe nodule.  Self-limiting hemoptysis following the core biopsies.   Electronically Signed   By: Markus Daft  M.D.   On: 05/14/2014 12:07   ASSESSMENT AND PLAN: This is a very pleasant 69 years old white male with recurrent non-small cell lung cancer, adenocarcinoma with positive EGFR mutation in exon 21 that was initially diagnosed as stage IA non-small cell lung cancer status post right upper lobectomy and has been observation since October of 2007. His recent imaging studies including CT scan of  the chest as well as PET scan showed evidence for disease recurrence in the right lung. The patient underwent CT-guided biopsy of the right lower lobe lesion and it was consistent with recurrent adenocarcinoma with positive EGFR mutation. I had a lengthy discussion with the patient today about his current disease stage, prognosis and treatment options. I gave the patient the option of treatment with oral Tarceva 150 mg by mouth daily versus chemotherapy versus observation and close monitoring versus enrollment in the BMS checkmate 370 clinical trial with the patient would be randomized for treatment with Tarceva versus Tarceva and Nivolumab. The patient was interested in the clinical trial and he was seen by the clinical research nurse later today for evaluation of eligibility. He will think about the trial and let us know in the next few days. The patient would start his treatment with oral Tarceva or treatment on the clinical trial within one week. We will arrange a follow-up appointment for him for evaluation and management of any adverse effect of his treatment. He was advised to call immediately if he has any concerning symptoms in the interval. The patient was advised to call immediately if he has any other concerning symptoms in the interval.  All questions were answered. The patient knows to call the clinic with any problems, questions or concerns. We can certainly see the patient much sooner if necessary.  Disclaimer: This note was dictated with voice recognition software. Similar sounding words can  inadvertently be transcribed and may not be corrected upon review.

## 2014-05-29 ENCOUNTER — Telehealth: Payer: Self-pay | Admitting: Internal Medicine

## 2014-05-29 ENCOUNTER — Encounter: Payer: Self-pay | Admitting: *Deleted

## 2014-05-29 NOTE — Telephone Encounter (Signed)
returned call and confirmed appt on 2.29 cx ok per Shauna Hugh

## 2014-05-29 NOTE — Progress Notes (Signed)
Radiation Oncology         [phone]) [phone] ________________________________  Initial outpatient Consultation - Date: 05/28/2014   Name: HAZAIAH EDGECOMBE MRN: [id number]   DOB: July 04, 1945  REFERRING PHYSICIAN: Melrose Nakayama, *  DIAGNOSIS:    ICD-9-CM ICD-10-CM   1. Malignant neoplasm of lower lobe of right lung 162.5 C34.31     STAGE: Lung cancer   Staging form: Lung, AJCC 7th Edition     Clinical: Stage IA (T1b, N0, M0) - Signed by Curt Bears, MD on 10/27/2012     Pathologic: No stage assigned - Unsigned Malignant neoplasm of lower lobe of right lung   Staging form: Lung, AJCC 6th Edition     Clinical stage from 05/28/2014: Stage IV (T1, N0, M1) - Signed by Curt Bears, MD on 05/28/2014       Staging comments: Recurrent adenocarcinoma with positive EGFR mutation.  HISTORY OF PRESENT ILLNESS::Caitlin C Coury is a 69 y.o. male who underwent a right upper lobectomy in 2007 for a non small cell carcinoma. He received no adjuvant treatment and has been monitored with serial imaging and has several nodules in the right lung that have been watched carefully. On recent imaging a lesion in the right lower lobe was noted to enlarge now at 2.2 x 1.3 cm).  Another lesion in the right lower lobe had also minimally enlarged measuring 27m. Stable ground glass opacity was seen in the right lower lobe. No lesions were noted in the left lung. PET scan showed uptake of 2.3 in the right lower lobe lesion and no hypermetabolic lymph nodes. The other lesions in the lung were too small to characterize. A biopsy was performed on 05/14/14 which showed adenocarcinoma.  He met with Dr. HRoxan Hockeyand is currently exploring his treatment options. He is scheduled to see Dr. MJulien Nordmannon Monday. He is completely asymptomatic. He is accompanied by his wife.   PREVIOUS RADIATION THERAPY: No  Past medical, social and family history were reviewed in the electronic chart. Review of symptoms was reviewed in the  electronic chart. Medications were reviewed in the electronic chart.   PHYSICAL EXAM:  Filed Vitals:   05/28/14 1432  BP: 131/76  Pulse: 72  Temp: 97.8 F (36.6 C)  Resp: 20  .246 lb 1.6 oz (111.63 kg). Pleasant male appears younger than his stated age. NOrmal respiratory effort and breath sounds.   IMPRESSION: T2N0 multifocal BAC with enlarging RUL nodule  PLAN: We discussed his options for treatment which from my perspective include radiation,  Radiation plus Tarceva, Tarceva alone or surgery. We discussed that radiation is a local option and it would be a great option for local control of this lesion but would not treat the other lesions. In addition, due to the size of this lesion and the way it creeps down the bronchus, it would be fractionated treatment in order to provide adequate margin for microscopic disease. We aldo discussed that the placement of this tumor may "use up" the majority of the radiation on this side and that a second course of radiation to the right lung would be more risky. He understood these options and would like to talk to Dr. MJulien Nordmann Dr. MJulien Nordmannactually was able to see him later this afternoon. I will se him back on an as needed basis.  I spent 40 minutes  face to face with the patient and more than 50% of that time was spent in counseling and/or coordination of care.   ------------------------------------------------  SMarzetta Board  Pablo Ledger, MD

## 2014-05-30 ENCOUNTER — Ambulatory Visit: Payer: Medicare Other

## 2014-05-30 ENCOUNTER — Telehealth: Payer: Self-pay | Admitting: Internal Medicine

## 2014-05-30 ENCOUNTER — Other Ambulatory Visit: Payer: Self-pay

## 2014-05-30 ENCOUNTER — Encounter: Payer: Medicare Other | Admitting: *Deleted

## 2014-05-30 ENCOUNTER — Other Ambulatory Visit: Payer: Self-pay | Admitting: *Deleted

## 2014-05-30 ENCOUNTER — Ambulatory Visit (HOSPITAL_BASED_OUTPATIENT_CLINIC_OR_DEPARTMENT_OTHER): Payer: Medicare Other

## 2014-05-30 DIAGNOSIS — C3431 Malignant neoplasm of lower lobe, right bronchus or lung: Secondary | ICD-10-CM | POA: Diagnosis not present

## 2014-05-30 DIAGNOSIS — C3430 Malignant neoplasm of lower lobe, unspecified bronchus or lung: Secondary | ICD-10-CM | POA: Diagnosis not present

## 2014-05-30 LAB — CBC WITH DIFFERENTIAL/PLATELET
BASO%: 0.6 % (ref 0.0–2.0)
BASOS ABS: 0 10*3/uL (ref 0.0–0.1)
EOS%: 2 % (ref 0.0–7.0)
Eosinophils Absolute: 0.1 10*3/uL (ref 0.0–0.5)
HEMATOCRIT: 44 % (ref 38.4–49.9)
HEMOGLOBIN: 14.5 g/dL (ref 13.0–17.1)
LYMPH#: 2.3 10*3/uL (ref 0.9–3.3)
LYMPH%: 36.6 % (ref 14.0–49.0)
MCH: 32.5 pg (ref 27.2–33.4)
MCHC: 33 g/dL (ref 32.0–36.0)
MCV: 98.5 fL — ABNORMAL HIGH (ref 79.3–98.0)
MONO#: 0.7 10*3/uL (ref 0.1–0.9)
MONO%: 10.7 % (ref 0.0–14.0)
NEUT#: 3.1 10*3/uL (ref 1.5–6.5)
NEUT%: 50.1 % (ref 39.0–75.0)
Platelets: 224 10*3/uL (ref 140–400)
RBC: 4.46 10*6/uL (ref 4.20–5.82)
RDW: 12.7 % (ref 11.0–14.6)
WBC: 6.2 10*3/uL (ref 4.0–10.3)

## 2014-05-30 LAB — MAGNESIUM (CC13): MAGNESIUM: 2.6 mg/dL — AB (ref 1.5–2.5)

## 2014-05-30 LAB — COMPREHENSIVE METABOLIC PANEL (CC13)
ALT: 22 U/L (ref 0–55)
ANION GAP: 12 meq/L — AB (ref 3–11)
AST: 23 U/L (ref 5–34)
Albumin: 4.5 g/dL (ref 3.5–5.0)
Alkaline Phosphatase: 61 U/L (ref 40–150)
BILIRUBIN TOTAL: 0.31 mg/dL (ref 0.20–1.20)
BUN: 12.3 mg/dL (ref 7.0–26.0)
CHLORIDE: 104 meq/L (ref 98–109)
CO2: 26 mEq/L (ref 22–29)
CREATININE: 0.9 mg/dL (ref 0.7–1.3)
Calcium: 10.1 mg/dL (ref 8.4–10.4)
EGFR: 89 mL/min/{1.73_m2} — AB (ref >90)
GLUCOSE: 101 mg/dL (ref 70–140)
Potassium: 3.7 mEq/L (ref 3.5–5.1)
Sodium: 141 mEq/L (ref 136–145)
Total Protein: 7 g/dL (ref 6.4–8.3)

## 2014-05-30 LAB — T3, FREE: T3, Free: 2.9 pg/mL (ref 2.3–4.2)

## 2014-05-30 LAB — LACTATE DEHYDROGENASE (CC13): LDH: 188 U/L (ref 125–245)

## 2014-05-30 LAB — T4, FREE: Free T4: 1.29 ng/dL (ref 0.80–1.80)

## 2014-05-30 LAB — HEPATITIS B SURFACE ANTIGEN: HEP B S AG: NEGATIVE

## 2014-05-30 LAB — HEPATITIS C ANTIBODY: HCV Ab: NEGATIVE

## 2014-05-30 LAB — PHOSPHORUS: Phosphorus: 3.3 mg/dL (ref 2.3–4.6)

## 2014-05-30 NOTE — Telephone Encounter (Signed)
s.w pt and advised on todays appt...ok and aware

## 2014-05-30 NOTE — Telephone Encounter (Signed)
Pt confirmed labs/ov per 02/26 POF, gave pt calendar..... KJ, gave pt barium

## 2014-06-02 ENCOUNTER — Other Ambulatory Visit: Payer: Self-pay | Admitting: *Deleted

## 2014-06-02 ENCOUNTER — Other Ambulatory Visit: Payer: Medicare Other

## 2014-06-02 ENCOUNTER — Telehealth: Payer: Self-pay | Admitting: Internal Medicine

## 2014-06-02 ENCOUNTER — Ambulatory Visit: Payer: Medicare Other | Admitting: Oncology

## 2014-06-02 DIAGNOSIS — N400 Enlarged prostate without lower urinary tract symptoms: Secondary | ICD-10-CM

## 2014-06-02 DIAGNOSIS — C3431 Malignant neoplasm of lower lobe, right bronchus or lung: Secondary | ICD-10-CM

## 2014-06-02 LAB — TSH CHCC: TSH: 2.574 m(IU)/L (ref 0.320–4.118)

## 2014-06-02 NOTE — Telephone Encounter (Signed)
EKG and chemo class added per 02/29 POF, s/w pt confirming and sent him to Radiology to schedule all CT's. S/w Gina T. To confirm change of apts to give time for CT's that have been linked together. Barnett Applebaum will contact pt confirming all visits for 03/01.[name]... KJ

## 2014-06-03 ENCOUNTER — Encounter: Payer: Self-pay | Admitting: Internal Medicine

## 2014-06-03 ENCOUNTER — Other Ambulatory Visit: Payer: Self-pay

## 2014-06-03 ENCOUNTER — Encounter (HOSPITAL_COMMUNITY): Payer: Self-pay

## 2014-06-03 ENCOUNTER — Ambulatory Visit: Payer: Medicare Other

## 2014-06-03 ENCOUNTER — Ambulatory Visit (HOSPITAL_COMMUNITY)
Admission: RE | Admit: 2014-06-03 | Discharge: 2014-06-03 | Disposition: A | Discharge status: Home / Self Care | Payer: Medicare Other | Source: Ambulatory Visit | Attending: Internal Medicine | Admitting: Internal Medicine

## 2014-06-03 ENCOUNTER — Other Ambulatory Visit: Payer: Medicare Other

## 2014-06-03 DIAGNOSIS — R918 Other nonspecific abnormal finding of lung field: Secondary | ICD-10-CM | POA: Diagnosis not present

## 2014-06-03 DIAGNOSIS — Z9889 Other specified postprocedural states: Secondary | ICD-10-CM | POA: Insufficient documentation

## 2014-06-03 DIAGNOSIS — C3431 Malignant neoplasm of lower lobe, right bronchus or lung: Secondary | ICD-10-CM

## 2014-06-03 DIAGNOSIS — Z85118 Personal history of other malignant neoplasm of bronchus and lung: Secondary | ICD-10-CM | POA: Diagnosis not present

## 2014-06-03 DIAGNOSIS — K8020 Calculus of gallbladder without cholecystitis without obstruction: Secondary | ICD-10-CM | POA: Diagnosis not present

## 2014-06-03 LAB — PSA: PSA: 0.98 ng/mL (ref <=4.00)

## 2014-06-03 MED ORDER — IOHEXOL 300 MG/ML  SOLN
100.0000 mL | Freq: Once | INTRAMUSCULAR | Status: AC | PRN
  Administered 2014-06-03: 100 mL via INTRAVENOUS

## 2014-06-03 NOTE — Progress Notes (Signed)
tarceva approved 06/03/14-06/02/15 10,000.00. I will send to medical records

## 2014-06-03 NOTE — Progress Notes (Signed)
Pt is approved with [employer] for [place] from 06/03/14 to 06/02/15 or when the benefit cap has been met.  The amount of the grant is $10,000.  Gave letter to Raquel to file.

## 2014-06-04 ENCOUNTER — Other Ambulatory Visit: Payer: Self-pay | Admitting: Internal Medicine

## 2014-06-04 ENCOUNTER — Encounter: Payer: Self-pay | Admitting: *Deleted

## 2014-06-04 DIAGNOSIS — C3431 Malignant neoplasm of lower lobe, right bronchus or lung: Secondary | ICD-10-CM

## 2014-06-04 MED ORDER — ERLOTINIB HCL 150 MG PO TABS: 150.0000 mg | ORAL_TABLET | Freq: Every day | ORAL | Status: DC

## 2014-06-04 NOTE — Progress Notes (Signed)
Prior authorization request for Tarceva from [facility] given to our Managed [facility].

## 2014-06-05 ENCOUNTER — Encounter: Payer: Self-pay | Admitting: *Deleted

## 2014-06-05 ENCOUNTER — Ambulatory Visit (HOSPITAL_BASED_OUTPATIENT_CLINIC_OR_DEPARTMENT_OTHER): Payer: Medicare Other | Admitting: Nurse Practitioner

## 2014-06-05 ENCOUNTER — Encounter: Payer: Self-pay | Admitting: Internal Medicine

## 2014-06-05 ENCOUNTER — Ambulatory Visit: Payer: Medicare Other

## 2014-06-05 ENCOUNTER — Telehealth: Payer: Self-pay | Admitting: Internal Medicine

## 2014-06-05 ENCOUNTER — Other Ambulatory Visit: Payer: Medicare Other

## 2014-06-05 VITALS — BP 143/90 | HR 74 | Temp 97.4°F | Resp 18 | Ht 72.0 in | Wt 247.0 lb

## 2014-06-05 DIAGNOSIS — C3431 Malignant neoplasm of lower lobe, right bronchus or lung: Secondary | ICD-10-CM | POA: Diagnosis not present

## 2014-06-05 DIAGNOSIS — Z006 Encounter for examination for normal comparison and control in clinical research program: Secondary | ICD-10-CM

## 2014-06-05 NOTE — Progress Notes (Addendum)
  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 returns as scheduled. He has no complaints. He reports a good appetite. No pain. He denies shortness breath. No chest pain. No leg swelling or calf pain. He reports chronic constipation related to another medication. No dysphagia. No nausea or vomiting.  He reports he is maintained on Keppra for migraine prevention.  Objective:  Vital signs in last 24 hours:  Blood pressure 143/90, pulse 74, temperature 97.4 F (36.3 C), temperature source Oral, resp. rate 18, height 6' (1.829 m), weight 247 lb (112.038 kg), SpO2 98 %.    HEENT: No thrush or ulcers. Lymphatics: No palpable cervical or supra clavicular lymph nodes. Resp: Lungs clear bilaterally. Cardio: Regular rate and rhythm. GI: Abdomen soft and nontender. No hepatomegaly. Vascular: No leg edema. Calves nontender. Neuro: Alert and oriented. Gait normal.  Skin: No rash.    Lab Results:  Lab Results  Component Value Date   WBC 6.2 05/30/2014   HGB 14.5 05/30/2014   HCT 44.0 05/30/2014   MCV 98.5* 05/30/2014   PLT 224 05/30/2014   NEUTROABS 3.1 05/30/2014    Imaging:  No results found.  Medications: I have reviewed the patient's current medications.  Assessment/Plan: 1. Recurrent non-small cell lung cancer, adenocarcinoma with positive EGFR mutation in exon 21 that was initially diagnosed as stage IA non-small cell lung cancer status post right upper lobectomy and has been observation since October of 2007. His recent imaging studies including CT scan of the chest as well as PET scan showed evidence for disease recurrence in the right lung. The patient underwent CT-guided biopsy of the right  lower lobe lesion and it was consistent with recurrent adenocarcinoma with positive EGFR mutation. 2. History of migraine headaches. He is maintained on Keppra. 3. Mildly elevated magnesium (2.6) 05/30/2014. Not clinically significant.   Disposition: Mr. [name] appears stable. He will be enrolling on the BMS CheckMate 370 clinical trial with randomization to Tarceva versus Tarceva and nivolumab. The randomization should be completed today. We will contact him once that information is available. He will return for a follow-up visit in 2 weeks. He will contact the office in the interim with any problems.  ECOG performance status measured at 0.  Ned Card ANP/GNP-BC   06/05/2014  10:03 AM  ADDENDUM: Hematology/Oncology Attending:  I had a face to face encounter with the patient. I recommended his care plan. This is a very pleasant 69 years old white male with locally advanced recurrent non-small cell lung cancer, adenocarcinoma with positive EGFR mutation in exon 21. The patient is currently undergoing evaluation for enrollment in the BMS CheckMate 370 clinical trial. He completed the eligibility criteria for the trial and expected to start the treatment tomorrow. I will call the prescription for Tarceva 150 mg by mouth daily to [facility]. We will continue to monitor the patient closely and follow-up according to the protocol. He was advised to call immediately if he has any concerning symptoms in the interval.  Disclaimer: This note was dictated with voice recognition software. Similar sounding words can inadvertently be transcribed and may be missed upon review. Eilleen Kempf., MD 06/07/2014

## 2014-06-05 NOTE — Progress Notes (Signed)
06/05/2014 1120 Patient met all eligibility and ineligibility criteria and was randomized on the BMS 370 study.  He received assignment Arm A, erlotinib.  Dr. [name]  made aware of treatment assignment.  Spoke with patient by phone and informed him of treatment assignment,  Patient understands to come to [facility] 06/06/14 for PROs and research labs and will then begin study treatment.  Rx was sent in to pharmacy by MD. Carmelina Noun is working on pre-authorization [place] Nurse.

## 2014-06-05 NOTE — Telephone Encounter (Signed)
S/w pt confirming labs/ov D/T and when he comes in next visit 03/04 to ask for his schedule, I will be leaving at front desk.... KJ

## 2014-06-05 NOTE — Telephone Encounter (Signed)
Labs/ov per 03/03 POF, msg note to give pt updated calendar at next visit 03/04.... KJ

## 2014-06-05 NOTE — Progress Notes (Signed)
Faxed pri-auth to [facility] for tarceva 150 mg tablet  30/30 take 1 tablet by mouth daily. Take on an emply stomach 1 hour before meals or 2 hours after.

## 2014-06-05 NOTE — Progress Notes (Signed)
[place] Social Work  Clinical Social Work was referred by [facility] clinic to review and complete healthcare advance directives.  Clinical Social Worker met with patient and patient's spouse in [place] office.  The patient designated spouse [name] as their primary healthcare agent and sister [name] as their secondary agent.  Patient also completed healthcare living will.    Clinical Social Worker notarized documents and made copies for patient/family. Clinical Social Worker will send documents to medical records to be scanned into patient's chart. Clinical Social Worker encouraged patient/family to contact with any additional questions or concerns.  Polo Riley, MSW, [place] Worker [facility] [phone]

## 2014-06-06 ENCOUNTER — Encounter: Payer: Self-pay | Admitting: Internal Medicine

## 2014-06-06 ENCOUNTER — Other Ambulatory Visit: Payer: Medicare Other

## 2014-06-06 ENCOUNTER — Encounter: Payer: Self-pay | Admitting: *Deleted

## 2014-06-06 ENCOUNTER — Ambulatory Visit: Payer: Medicare Other

## 2014-06-06 DIAGNOSIS — C3431 Malignant neoplasm of lower lobe, right bronchus or lung: Secondary | ICD-10-CM

## 2014-06-06 LAB — RESEARCH LABS

## 2014-06-06 NOTE — Progress Notes (Signed)
06/06/2014  0815 Patient confirmed that there had not been any changes in his physical condition since exam on 06/05/2014.  He stated that he had not taken any medication that was not included on his medication list. Marcellus Scott, RN Clinical Research Nurse

## 2014-06-06 NOTE — Progress Notes (Signed)
06/06/2014 0815 BMS 370 day 1 Patient in to clinic.  He was able to do the FACT-L and EQ-5D.  He then had research labs drawn for soluble biomarkers, genomic DNA, and circulating tumor DNA.  Patient confirmed he would start erlotinib today and that he would call this RN to give the exact start time.  Patient was reminded to keep a study diary/medication calendar with record of drug compliance.  Patient was without any questions regarding erlotinib, including side effects, when and how to take drug, or dose. Patient verbalized comprehension.   Marcellus Scott, RN Clinical Research Nurse  06/06/2014 1200 Received phone call from the patient stating he took first dose of erlotinib today at 1150.  He denied having any questions.  This RN thanked the patient for his time and commitment to participation on the BMS 370 study. Marcellus Scott, RN Clinical Research Nurse

## 2014-06-06 NOTE — Progress Notes (Signed)
[facility] [employer] @ [phone] about tarceva Josem Kaufmann; approved from 06/06/14-06/06/15 # [id number]

## 2014-06-07 ENCOUNTER — Encounter: Payer: Self-pay | Admitting: Internal Medicine

## 2014-06-09 ENCOUNTER — Encounter: Payer: Self-pay | Admitting: Medical Oncology

## 2014-06-09 ENCOUNTER — Encounter: Payer: Self-pay | Admitting: Internal Medicine

## 2014-06-09 ENCOUNTER — Encounter: Payer: Self-pay | Admitting: *Deleted

## 2014-06-09 DIAGNOSIS — C3431 Malignant neoplasm of lower lobe, right bronchus or lung: Secondary | ICD-10-CM

## 2014-06-09 MED ORDER — ERLOTINIB HCL 150 MG PO TABS: 150.0000 mg | ORAL_TABLET | Freq: Every day | ORAL | Status: DC

## 2014-06-09 NOTE — Progress Notes (Signed)
Follow up call to patient, day four of starting Erlotinib. Patient reports to have a "rash" that started "sometime today" on chest and neck. Patient states he hadn't noticed it this morning upon waking up. He does report "some itching" to the areas. Denies any other symptoms including SOB. Did inform patient that a rash is a common side effect to this medication, but should it worsen or he develops any new or worsening symptoms to please call [phone]. Patient gave verbal understanding. Denies any questions at this time. Dr. Lew Dawes desk nurse, Abelina Bachelor, informed.  Adele Dan, RN, Clinical Research 06/09/2014 2:23 PM

## 2014-06-16 ENCOUNTER — Telehealth: Payer: Self-pay | Admitting: Medical Oncology

## 2014-06-16 ENCOUNTER — Other Ambulatory Visit: Payer: Self-pay | Admitting: Medical Oncology

## 2014-06-16 ENCOUNTER — Encounter: Payer: Self-pay | Admitting: Medical Oncology

## 2014-06-16 NOTE — Telephone Encounter (Signed)
Pt stated that the on call provider for service sent rx to pts local pharmacy. Meds added to pt list

## 2014-06-16 NOTE — Telephone Encounter (Signed)
Received request for two meds Doxycycline and hydrocortisone cream. I called pt and left message that these were received and I need to know where they should be sent.

## 2014-06-19 ENCOUNTER — Other Ambulatory Visit: Payer: Medicare Other

## 2014-06-19 ENCOUNTER — Other Ambulatory Visit (HOSPITAL_BASED_OUTPATIENT_CLINIC_OR_DEPARTMENT_OTHER): Payer: Medicare Other

## 2014-06-19 ENCOUNTER — Ambulatory Visit: Payer: Medicare Other | Admitting: Nurse Practitioner

## 2014-06-19 ENCOUNTER — Ambulatory Visit (HOSPITAL_BASED_OUTPATIENT_CLINIC_OR_DEPARTMENT_OTHER): Payer: Medicare Other | Admitting: Internal Medicine

## 2014-06-19 ENCOUNTER — Other Ambulatory Visit (HOSPITAL_COMMUNITY)
Admission: RE | Admit: 2014-06-19 | Discharge: 2014-06-19 | Disposition: A | Discharge status: Home / Self Care | Payer: Medicare Other | Source: Ambulatory Visit | Attending: Internal Medicine | Admitting: Internal Medicine

## 2014-06-19 ENCOUNTER — Encounter: Payer: Self-pay | Admitting: *Deleted

## 2014-06-19 ENCOUNTER — Encounter: Payer: Self-pay | Admitting: Internal Medicine

## 2014-06-19 VITALS — BP 135/79 | HR 94 | Temp 98.2°F | Resp 18 | Ht 72.0 in | Wt 244.5 lb

## 2014-06-19 DIAGNOSIS — C3431 Malignant neoplasm of lower lobe, right bronchus or lung: Secondary | ICD-10-CM

## 2014-06-19 DIAGNOSIS — L270 Generalized skin eruption due to drugs and medicaments taken internally: Secondary | ICD-10-CM | POA: Diagnosis not present

## 2014-06-19 DIAGNOSIS — C3490 Malignant neoplasm of unspecified part of unspecified bronchus or lung: Secondary | ICD-10-CM | POA: Insufficient documentation

## 2014-06-19 DIAGNOSIS — Z006 Encounter for examination for normal comparison and control in clinical research program: Secondary | ICD-10-CM | POA: Diagnosis not present

## 2014-06-19 LAB — COMPREHENSIVE METABOLIC PANEL (CC13)
ALBUMIN: 4 g/dL (ref 3.5–5.0)
ALK PHOS: 66 U/L (ref 40–150)
ALT: 25 U/L (ref 0–55)
AST: 23 U/L (ref 5–34)
Anion Gap: 9 mEq/L (ref 3–11)
BUN: 14.9 mg/dL (ref 7.0–26.0)
CALCIUM: 9.4 mg/dL (ref 8.4–10.4)
CO2: 26 meq/L (ref 22–29)
CREATININE: 0.9 mg/dL (ref 0.7–1.3)
Chloride: 104 mEq/L (ref 98–109)
EGFR: 88 mL/min/{1.73_m2} — ABNORMAL LOW (ref >90)
Glucose: 105 mg/dl (ref 70–140)
Potassium: 3.6 mEq/L (ref 3.5–5.1)
Sodium: 139 mEq/L (ref 136–145)
Total Bilirubin: 0.51 mg/dL (ref 0.20–1.20)
Total Protein: 6.7 g/dL (ref 6.4–8.3)

## 2014-06-19 LAB — CBC WITH DIFFERENTIAL/PLATELET
BASO%: 0.6 % (ref 0.0–2.0)
Basophils Absolute: 0.1 10*3/uL (ref 0.0–0.1)
EOS%: 1.9 % (ref 0.0–7.0)
Eosinophils Absolute: 0.2 10*3/uL (ref 0.0–0.5)
HCT: 42.5 % (ref 38.4–49.9)
HEMOGLOBIN: 14.1 g/dL (ref 13.0–17.1)
LYMPH#: 2.3 10*3/uL (ref 0.9–3.3)
LYMPH%: 28.3 % (ref 14.0–49.0)
MCH: 33 pg (ref 27.2–33.4)
MCHC: 33.3 g/dL (ref 32.0–36.0)
MCV: 99.1 fL — AB (ref 79.3–98.0)
MONO#: 0.9 10*3/uL (ref 0.1–0.9)
MONO%: 11.1 % (ref 0.0–14.0)
NEUT#: 4.8 10*3/uL (ref 1.5–6.5)
NEUT%: 58.1 % (ref 39.0–75.0)
PLATELETS: 256 10*3/uL (ref 140–400)
RBC: 4.29 10*6/uL (ref 4.20–5.82)
RDW: 12.9 % (ref 11.0–14.6)
WBC: 8.2 10*3/uL (ref 4.0–10.3)

## 2014-06-19 LAB — PHOSPHORUS: PHOSPHORUS: 3 mg/dL (ref 2.3–4.6)

## 2014-06-19 LAB — LACTATE DEHYDROGENASE (CC13): LDH: 185 U/L (ref 125–245)

## 2014-06-19 LAB — MAGNESIUM (CC13): MAGNESIUM: 2.4 mg/dL (ref 1.5–2.5)

## 2014-06-19 MED ORDER — CLINDAMYCIN PHOSPHATE 1 % EX SOLN
Freq: Two times a day (BID) | CUTANEOUS | Status: DC
Start: 2014-06-19 — End: 2014-11-06

## 2014-06-19 NOTE — Progress Notes (Signed)
[name] [phone]   [phone]  OFFICE PROGRESS NOTE  [name],[name], MD 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 Roxan Hockey on 01/23/2006.   CURRENT THERAPY: Tarceva 150 mg by mouth daily as part of the BMS checkmate 370 clinical trial. Status post 2 weeks of treatment.  DISEASE STAGE: Recurrent  CHEMOTHERAPY INTENT: Palliative.  CURRENT # OF CHEMOTHERAPY CYCLES: 1  CURRENT ANTIEMETICS: None  CURRENT SMOKING STATUS: Non-smoker  ORAL CHEMOTHERAPY AND CONSENT: None  CURRENT BISPHOSPHONATES USE: None  LIVING WILL AND CODE STATUS: Full code.  INTERVAL HISTORY: [name] 69 y.o. male returns to the clinic today for followup visit accompanied by his wife. The patient was started on treatment with Tarceva 150 mg by mouth daily status post 2 weeks of treatment. He is tolerating his treatment fairly well except for papulomacular skin rash on the face, chest and scalp. He is applying some hydrocortisone cream to the sacral area with mild improvement. He was also started on treatment with doxycycline 100 mg by mouth daily for several days now. He is feeling fine today with no other complaints. He denied having any significant chest pain, shortness of breath, cough or hemoptysis. The patient denied having any fever or chills, no nausea or vomiting and no diarrhea. He denied having any significant weight loss or night sweats.  MEDICAL HISTORY: Past Medical History  Diagnosis Date  . Hypertension   . Hypercholesterolemia   . Sinus disease     hx of  . History of pneumonia  last 3-4 yrs ago    3 -4 different  times  . Hypothyroidism   . Sleep apnea     uses cpap setting opf 12  . History of agent Orange exposure 44-45 yrs ago  . Complication of anesthesia 2007    quit breathing with bronchoscopy, had to spend night  . lung ca dx'd 2007    lung right    ALLERGIES:  is allergic to lisinopril.  MEDICATIONS:  Current Outpatient Prescriptions  Medication Sig Dispense Refill  . amitriptyline (ELAVIL) 50 MG tablet Take 50 mg by mouth at bedtime.    [name] aspirin 81 MG tablet Take 81 mg by mouth every morning.     . cholecalciferol (VITAMIN D) 1000 UNITS tablet Take 1,000 Units by mouth daily.    [name] diltiazem (CARDIZEM CD) 360 MG 24 hr capsule Take 360 mg by mouth every morning.     . docusate sodium (COLACE) 100 MG capsule Take 100 mg by mouth 2 (two) times daily.    [name] doxycycline (DORYX) 100 MG DR capsule Take 100 mg by mouth 2 (two) times daily.    [name] erlotinib (TARCEVA) 150 MG tablet Take 1 tablet (150 mg total) by mouth daily. Take on an empty stomach 1 hour before meals or 2 hours after. 30 tablet 0  . hydrocortisone cream 1 % Apply 1 application topically 2 (two) times daily. Per on call provider    . levETIRAcetam (KEPPRA) 500 MG tablet Take 750 mg by mouth daily.     . Levothyroxine Sodium 50 MCG CAPS Take 75 mcg by mouth daily before breakfast.     .  metoprolol succinate (TOPROL-XL) 25 MG 24 hr tablet Take 25 mg by mouth every morning.     . Multiple Vitamins-Minerals (CENTRUM SILVER PO) Take 1 tablet by mouth daily.     . Omega-3 Fat Ac-Cholecalciferol (MINICAPS VITAMIN-D/OMEGA-3) [phone] MG-UNIT CAPS Take 1 tablet by mouth daily.     . psyllium (REGULOID) 0.52 G capsule Take 0.52 g by mouth daily.      . rosuvastatin (CRESTOR) 5 MG tablet Take 5 mg by mouth every morning.     . valsartan-hydrochlorothiazide (DIOVAN-HCT) 320-25 MG per tablet Take 1 tablet by mouth every morning.     . metoCLOPramide (REGLAN) 10 MG tablet Take 10 mg by mouth every 6 (six) hours as needed for nausea.     .  naproxen sodium (ANAPROX) 550 MG tablet Take 550 mg by mouth 3 (three) times daily as needed for moderate pain.     . nitroGLYCERIN (NITROSTAT) 0.4 MG SL tablet Place 0.4 mg under the tongue every 5 (five) minutes as needed for chest pain.      No current facility-administered medications for this visit.    SURGICAL HISTORY:  Past Surgical History  Procedure Laterality Date  . Cystourethroscopy, gyrus turp.  04/16/2010  . Torn medial and lateral menisci, left knee.  11/06/2006  . Right video-assisted thoracoscopy, right upper lobectomy and  01/23/2006  . The olympus video bronchoscope was introduced via the right  12/21/2005  . Nasal sinus surgery  1987  . Tonsillectomy  1957  . Colonoscopy with propofol N/A 03/19/2013    Procedure: COLONOSCOPY WITH PROPOFOL;  Surgeon: Garlan Fair, MD;  Location: [facility];  Service: Endoscopy;  Laterality: N/A;    REVIEW OF SYSTEMS:  A comprehensive review of systems was negative except for: Integument/breast: positive for dryness and rash   PHYSICAL EXAMINATION: General appearance: alert, cooperative and no distress Head: Normocephalic, without obvious abnormality, atraumatic Neck: no adenopathy Lymph nodes: Cervical, supraclavicular, and axillary nodes normal. Resp: clear to auscultation bilaterally Back: symmetric, no curvature. ROM normal. No CVA tenderness. Cardio: regular rate and rhythm, S1, S2 normal, no murmur, click, rub or gallop GI: soft, non-tender; bowel sounds normal; no masses,  no organomegaly Extremities: extremities normal, atraumatic, no cyanosis or edema Neurologic: Alert and oriented X 3, normal strength and tone. Normal symmetric reflexes. Normal coordination and gait  ECOG PERFORMANCE STATUS: 0 - Asymptomatic  Blood pressure 135/79, pulse 94, temperature 98.2 F (36.8 C), temperature source Oral, resp. rate 18, height 6' (1.829 m), weight 244 lb 8 oz (110.904 kg), SpO2 99 %.  LABORATORY DATA: Lab Results    Component Value Date   WBC 8.2 06/19/2014   HGB 14.1 06/19/2014   HCT 42.5 06/19/2014   MCV 99.1* 06/19/2014   PLT 256 06/19/2014      Chemistry      Component Value Date/Time   NA 139 06/19/2014 0937   NA 137 05/14/2014 0730   NA 139 10/20/2011 0903   K 3.6 06/19/2014 0937   K 3.6 05/14/2014 0730   K 3.4 10/20/2011 0903   CL 101 05/14/2014 0730   CL 102 04/24/2012 0853   CL 101 10/20/2011 0903   CO2 26 06/19/2014 0937   CO2 29 05/14/2014 0730   CO2 29 10/20/2011 0903   BUN 14.9 06/19/2014 0937   BUN 14 05/14/2014 0730   BUN 12 10/20/2011 0903   CREATININE 0.9 06/19/2014 0937   CREATININE 0.94 05/14/2014 0730   CREATININE 0.9 10/20/2011 8099  Component Value Date/Time   CALCIUM 9.4 06/19/2014 0937   CALCIUM 9.4 05/14/2014 0730   CALCIUM 9.0 10/20/2011 0903   ALKPHOS 66 06/19/2014 0937   ALKPHOS 52 10/20/2011 0903   ALKPHOS 53 06/25/2010 0758   AST 23 06/19/2014 0937   AST 24 10/20/2011 0903   AST 22 06/25/2010 0758   ALT 25 06/19/2014 0937   ALT 31 10/20/2011 0903   ALT 22 06/25/2010 0758   BILITOT 0.51 06/19/2014 0937   BILITOT 0.60 10/20/2011 0903   BILITOT 0.3 06/25/2010 0758       RADIOGRAPHIC STUDIES: Ct Chest W Contrast  06/03/2014   CLINICAL DATA:  69 year old male with history of lung cancer diagnosed in 2007 status post right upper lobectomy. RECIST protocol (baseline).  EXAM: CT CHEST, ABDOMEN, AND PELVIS WITH CONTRAST  TECHNIQUE: Multidetector CT imaging of the chest, abdomen and pelvis was performed following the standard protocol during bolus administration of intravenous contrast.  CONTRAST:  132m OMNIPAQUE IOHEXOL 300 MG/ML  SOLN  COMPARISON:  Chest CT 04/22/2014.  PET-CT 05/07/2014.  FINDINGS: RECIST 1.1  Target Lesions:  1. Mixed ground-glass attenuation and solid lesion in the right lower lobe (image 21 of series 5) measuring up to 23 mm in greatest length. 2. Solid right lower lobe nodule (image 23 of series 5) measuring up to 19 mm in  greatest length. Non-target Lesions:  1. Ground-glass attenuation right lower lobe nodule (image 25 of series 5) measuring up to 12 mm in greatest length.  CT CHEST FINDINGS  Mediastinum/Lymph Nodes: Heart size is normal. There is no significant pericardial fluid, thickening or pericardial calcification. No pathologically enlarged mediastinal or hilar lymph nodes. Esophagus is unremarkable in appearance. No axillary lymphadenopathy.  Lungs/Pleura: Multiple pulmonary nodules are again noted in the right lower lobe. The largest of these is a mixed ground-glass attenuation and solid nodule on image 21 of series 5 which measures 23 x 19 mm, was has several internal solid components, largest of which measures 10 mm in length (image 22 of series 2). More laterally and inferior to this there is a second lesion which measures at least 19 x 8 mm (image 23 of series 5) in the right lower lobe, which is more diffusely solid in appearance. Additionally, there is a third ground-glass attenuation nodule more anteriorly and inferiorly in the right lower lobe (image 25 of series 5) which currently measures 12 x 7 mm. There is also a slightly nodular appearing area of soft tissue prominence adjacent to the suture line in the medial aspect of the right lung, best appreciated on image 23 of series 5, which has been stable in appearance over numerous prior examinations, favored to represent an area of benign postoperative scarring. No confluent consolidative airspace disease. No pleural effusions.  Musculoskeletal/Soft Tissues: There are no aggressive appearing lytic or blastic lesions noted in the visualized portions of the skeleton.  CT ABDOMEN AND PELVIS FINDINGS  Hepatobiliary: No cystic or solid hepatic lesions. No intra or extrahepatic biliary ductal dilatation. 2.6 x 2.3 cm calcified gallstone in the gallbladder. No current findings to suggest an acute cholecystitis at this time.  Pancreas: Unremarkable.  Spleen: Unremarkable.   Adrenals/Urinary Tract: Bilateral adrenal glands are normal in appearance. No suspicious cystic or solid renal lesions. Bilateral perinephric stranding (nonspecific). Kidneys are otherwise unremarkable in appearance. No hydroureteronephrosis. Urinary bladder is normal in appearance.  Stomach/Bowel: Normal appearance of the stomach. No pathologic dilatation of small bowel or colon.  Vascular/Lymphatic: Mild atherosclerosis throughout the abdominal  and pelvic vasculature, without evidence of aneurysm or dissection. No lymphadenopathy noted in the abdomen or pelvis.  Reproductive: Prostate gland and seminal vesicles are normal in appearance.  Other: No significant volume of ascites.  No pneumoperitoneum.  Musculoskeletal: There are no aggressive appearing lytic or blastic lesions noted in the visualized portions of the skeleton.  IMPRESSION: 1. Multiple pulmonary nodules in the right lower lobe, as detailed above. One of these has been recently biopsied and proven to be an adenocarcinoma (percutaneous CT-guided biopsy performed on 05/14/2014). 2. No signs of metastatic disease in the abdomen or pelvis. 3. Status post right upper lobectomy with what appears to be some stable postoperative scarring in the central aspect of the right lung (image 23 of series 5). Attention on followup studies is recommended to ensure the continued stability of this region. 4. Cholelithiasis without evidence to suggest acute cholecystitis at this time. 5. Additional incidental findings, as above.   Electronically Signed   By: Vinnie Langton M.D.   On: 06/03/2014 08:35   Ct Abdomen Pelvis W Contrast  06/03/2014   CLINICAL DATA:  69 year old male with history of lung cancer diagnosed in 2007 status post right upper lobectomy. RECIST protocol (baseline).  EXAM: CT CHEST, ABDOMEN, AND PELVIS WITH CONTRAST  TECHNIQUE: Multidetector CT imaging of the chest, abdomen and pelvis was performed following the standard protocol during bolus  administration of intravenous contrast.  CONTRAST:  183m OMNIPAQUE IOHEXOL 300 MG/ML  SOLN  COMPARISON:  Chest CT 04/22/2014.  PET-CT 05/07/2014.  FINDINGS: RECIST 1.1  Target Lesions:  1. Mixed ground-glass attenuation and solid lesion in the right lower lobe (image 21 of series 5) measuring up to 23 mm in greatest length. 2. Solid right lower lobe nodule (image 23 of series 5) measuring up to 19 mm in greatest length. Non-target Lesions:  1. Ground-glass attenuation right lower lobe nodule (image 25 of series 5) measuring up to 12 mm in greatest length.  CT CHEST FINDINGS  Mediastinum/Lymph Nodes: Heart size is normal. There is no significant pericardial fluid, thickening or pericardial calcification. No pathologically enlarged mediastinal or hilar lymph nodes. Esophagus is unremarkable in appearance. No axillary lymphadenopathy.  Lungs/Pleura: Multiple pulmonary nodules are again noted in the right lower lobe. The largest of these is a mixed ground-glass attenuation and solid nodule on image 21 of series 5 which measures 23 x 19 mm, was has several internal solid components, largest of which measures 10 mm in length (image 22 of series 2). More laterally and inferior to this there is a second lesion which measures at least 19 x 8 mm (image 23 of series 5) in the right lower lobe, which is more diffusely solid in appearance. Additionally, there is a third ground-glass attenuation nodule more anteriorly and inferiorly in the right lower lobe (image 25 of series 5) which currently measures 12 x 7 mm. There is also a slightly nodular appearing area of soft tissue prominence adjacent to the suture line in the medial aspect of the right lung, best appreciated on image 23 of series 5, which has been stable in appearance over numerous prior examinations, favored to represent an area of benign postoperative scarring. No confluent consolidative airspace disease. No pleural effusions.  Musculoskeletal/Soft Tissues: There  are no aggressive appearing lytic or blastic lesions noted in the visualized portions of the skeleton.  CT ABDOMEN AND PELVIS FINDINGS  Hepatobiliary: No cystic or solid hepatic lesions. No intra or extrahepatic biliary ductal dilatation. 2.6 x 2.3 cm calcified  gallstone in the gallbladder. No current findings to suggest an acute cholecystitis at this time.  Pancreas: Unremarkable.  Spleen: Unremarkable.  Adrenals/Urinary Tract: Bilateral adrenal glands are normal in appearance. No suspicious cystic or solid renal lesions. Bilateral perinephric stranding (nonspecific). Kidneys are otherwise unremarkable in appearance. No hydroureteronephrosis. Urinary bladder is normal in appearance.  Stomach/Bowel: Normal appearance of the stomach. No pathologic dilatation of small bowel or colon.  Vascular/Lymphatic: Mild atherosclerosis throughout the abdominal and pelvic vasculature, without evidence of aneurysm or dissection. No lymphadenopathy noted in the abdomen or pelvis.  Reproductive: Prostate gland and seminal vesicles are normal in appearance.  Other: No significant volume of ascites.  No pneumoperitoneum.  Musculoskeletal: There are no aggressive appearing lytic or blastic lesions noted in the visualized portions of the skeleton.  IMPRESSION: 1. Multiple pulmonary nodules in the right lower lobe, as detailed above. One of these has been recently biopsied and proven to be an adenocarcinoma (percutaneous CT-guided biopsy performed on 05/14/2014). 2. No signs of metastatic disease in the abdomen or pelvis. 3. Status post right upper lobectomy with what appears to be some stable postoperative scarring in the central aspect of the right lung (image 23 of series 5). Attention on followup studies is recommended to ensure the continued stability of this region. 4. Cholelithiasis without evidence to suggest acute cholecystitis at this time. 5. Additional incidental findings, as above.   Electronically Signed   By: Vinnie Langton M.D.   On: 06/03/2014 08:35   ASSESSMENT AND PLAN: This is a very pleasant 69 years old white male with recurrent non-small cell lung cancer, adenocarcinoma with positive EGFR mutation in exon 21 that was initially diagnosed as stage IA non-small cell lung cancer status post right upper lobectomy and has been observation since October of 2007. The patient is currently on treatment with Tarceva 150 mg by mouth daily according to the BMS checkmate 370 clinical trial. He is tolerating the treatment well except for the skin rash. I advised the patient to continue on doxycycline 100 mg by mouth daily for 7 more days. I will also start the patient on clindamycin lotion 1% to be applied to the skin rash area twice a day. He would come back for follow-up visit in 2 weeks for reevaluation and management of any adverse effect of his treatment. He was advised to call immediately if he has any concerning symptoms in the interval. The patient was advised to call immediately if he has any other concerning symptoms in the interval.  All questions were answered. The patient knows to call the clinic with any problems, questions or concerns. We can certainly see the patient much sooner if necessary.  Disclaimer: This note was dictated with voice recognition software. Similar sounding words can inadvertently be transcribed and may not be corrected upon review.

## 2014-06-20 ENCOUNTER — Telehealth: Payer: Self-pay | Admitting: Medical Oncology

## 2014-06-20 ENCOUNTER — Other Ambulatory Visit: Payer: Self-pay | Admitting: *Deleted

## 2014-06-20 ENCOUNTER — Other Ambulatory Visit: Payer: Self-pay | Admitting: Medical Oncology

## 2014-06-20 DIAGNOSIS — C3400 Malignant neoplasm of unspecified main bronchus: Secondary | ICD-10-CM

## 2014-06-20 DIAGNOSIS — R21 Rash and other nonspecific skin eruption: Secondary | ICD-10-CM

## 2014-06-20 DIAGNOSIS — C3431 Malignant neoplasm of lower lobe, right bronchus or lung: Secondary | ICD-10-CM

## 2014-06-20 MED ORDER — DOXYCYCLINE HYCLATE 100 MG PO CPEP: 100.0000 mg | ORAL_CAPSULE | Freq: Two times a day (BID) | ORAL | Status: DC

## 2014-06-20 MED ORDER — DOXYCYCLINE HYCLATE 100 MG PO CAPS: 100.0000 mg | ORAL_CAPSULE | Freq: Two times a day (BID) | ORAL | Status: DC

## 2014-06-20 NOTE — Telephone Encounter (Signed)
Patient called to say the co-pay for the doxycycline XR is over $150.00.  Conferred with Dr. Benay Spice who said that the usual doxycycline 100 mg bid is adequate for Tarceva skin toxicity, and that he should take it continuously to manage the skin problem.

## 2014-06-20 NOTE — Progress Notes (Signed)
06/19/14 0945 BMS 370 study, Group D, Arm A erlotinib only, cycle 2, day 1  Patient returns to clinic for MD visit after having been on study drug, erlotinib since start date of 06/06/14.  Patient completed the FACT-L and EQ-5D prior to his research labs.  He reports that he has not missed any doses of study drug and confirms he has taken it as prescribed.  He complains of skin rash, grade 2,  which has developed on his chest, face, and scalp.  He complains of scalp pain from the rash which is red, raised, and very sore on his head.   He was prescribed hydrocortisone cream and doxycycline for the rash prior to today's visit per on-call MD on 06/14/14.  Patient complains of constipation (states this is normal for him) and complains of insomnia due to scalp discomfort.  He was seen by MD and received a new prescription for clindamycin cream.  He understands to contact MD if he has worsening symptoms.  He denies problems with dyspnea, cough, nausea/vomiting, diarrhea, or other complaints at today's visit.   Marcellus Scott, RN Clinical Research Nurse

## 2014-06-20 NOTE — Telephone Encounter (Signed)
Pharmacy called reporting receipt of order to be discontinued.  Need regular doxycycline without DR.  Verbal order given via phone.  Documentation of correct order entered.

## 2014-06-20 NOTE — Telephone Encounter (Signed)
Request refill for doxycycline for skin eruptions/rash on scalp. Done.

## 2014-06-26 DIAGNOSIS — S46819A Strain of other muscles, fascia and tendons at shoulder and upper arm level, unspecified arm, initial encounter: Secondary | ICD-10-CM | POA: Diagnosis not present

## 2014-07-03 ENCOUNTER — Other Ambulatory Visit (HOSPITAL_BASED_OUTPATIENT_CLINIC_OR_DEPARTMENT_OTHER): Payer: Medicare Other

## 2014-07-03 ENCOUNTER — Encounter: Payer: Self-pay | Admitting: Internal Medicine

## 2014-07-03 ENCOUNTER — Other Ambulatory Visit (HOSPITAL_COMMUNITY)
Admission: RE | Admit: 2014-07-03 | Discharge: 2014-07-03 | Disposition: A | Discharge status: Home / Self Care | Payer: Medicare Other | Source: Ambulatory Visit | Attending: Internal Medicine | Admitting: Internal Medicine

## 2014-07-03 ENCOUNTER — Encounter: Payer: Self-pay | Admitting: *Deleted

## 2014-07-03 ENCOUNTER — Ambulatory Visit (HOSPITAL_BASED_OUTPATIENT_CLINIC_OR_DEPARTMENT_OTHER): Payer: Medicare Other | Admitting: Internal Medicine

## 2014-07-03 VITALS — BP 144/97 | HR 89 | Resp 19 | Ht 72.0 in | Wt 239.3 lb

## 2014-07-03 DIAGNOSIS — C3431 Malignant neoplasm of lower lobe, right bronchus or lung: Secondary | ICD-10-CM

## 2014-07-03 DIAGNOSIS — L270 Generalized skin eruption due to drugs and medicaments taken internally: Secondary | ICD-10-CM

## 2014-07-03 DIAGNOSIS — C3490 Malignant neoplasm of unspecified part of unspecified bronchus or lung: Secondary | ICD-10-CM | POA: Insufficient documentation

## 2014-07-03 DIAGNOSIS — Z006 Encounter for examination for normal comparison and control in clinical research program: Secondary | ICD-10-CM | POA: Diagnosis not present

## 2014-07-03 DIAGNOSIS — E039 Hypothyroidism, unspecified: Secondary | ICD-10-CM | POA: Diagnosis not present

## 2014-07-03 LAB — CBC WITH DIFFERENTIAL/PLATELET
BASO%: 0.9 % (ref 0.0–2.0)
BASOS ABS: 0.1 10*3/uL (ref 0.0–0.1)
EOS ABS: 0.3 10*3/uL (ref 0.0–0.5)
EOS%: 3.8 % (ref 0.0–7.0)
HCT: 41.9 % (ref 38.4–49.9)
HEMOGLOBIN: 13.9 g/dL (ref 13.0–17.1)
LYMPH%: 37.2 % (ref 14.0–49.0)
MCH: 33 pg (ref 27.2–33.4)
MCHC: 33.2 g/dL (ref 32.0–36.0)
MCV: 99.2 fL — AB (ref 79.3–98.0)
MONO#: 0.7 10*3/uL (ref 0.1–0.9)
MONO%: 10.1 % (ref 0.0–14.0)
NEUT#: 3.1 10*3/uL (ref 1.5–6.5)
NEUT%: 48 % (ref 39.0–75.0)
Platelets: 295 10*3/uL (ref 140–400)
RBC: 4.23 10*6/uL (ref 4.20–5.82)
RDW: 12.9 % (ref 11.0–14.6)
WBC: 6.5 10*3/uL (ref 4.0–10.3)
lymph#: 2.4 10*3/uL (ref 0.9–3.3)

## 2014-07-03 LAB — LACTATE DEHYDROGENASE (CC13): LDH: 211 U/L (ref 125–245)

## 2014-07-03 LAB — COMPREHENSIVE METABOLIC PANEL (CC13)
ALK PHOS: 69 U/L (ref 40–150)
ALT: 82 U/L — AB (ref 0–55)
AST: 59 U/L — AB (ref 5–34)
Albumin: 3.8 g/dL (ref 3.5–5.0)
Anion Gap: 10 mEq/L (ref 3–11)
BILIRUBIN TOTAL: 0.43 mg/dL (ref 0.20–1.20)
BUN: 13.6 mg/dL (ref 7.0–26.0)
CALCIUM: 9 mg/dL (ref 8.4–10.4)
CHLORIDE: 104 meq/L (ref 98–109)
CO2: 24 meq/L (ref 22–29)
Creatinine: 0.9 mg/dL (ref 0.7–1.3)
EGFR: 87 mL/min/{1.73_m2} — ABNORMAL LOW (ref >90)
Glucose: 114 mg/dl (ref 70–140)
Potassium: 3.6 mEq/L (ref 3.5–5.1)
Sodium: 139 mEq/L (ref 136–145)
Total Protein: 6.3 g/dL — ABNORMAL LOW (ref 6.4–8.3)

## 2014-07-03 LAB — MAGNESIUM (CC13): Magnesium: 2.4 mg/dl (ref 1.5–2.5)

## 2014-07-03 LAB — PHOSPHORUS: Phosphorus: 2.6 mg/dL (ref 2.3–4.6)

## 2014-07-03 MED ORDER — METHYLPREDNISOLONE (PAK) 4 MG PO TABS: ORAL_TABLET | ORAL | Status: DC

## 2014-07-03 NOTE — Progress Notes (Signed)
[place] Telephone:([phone]   Fax:(336) [phone]  OFFICE PROGRESS NOTE  [name],[name], MD 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 Roxan Hockey on 01/23/2006.   CURRENT THERAPY: Tarceva 150 mg by mouth daily as part of the BMS checkmate 370 clinical trial. Status post 4 weeks of treatment.  DISEASE STAGE: Recurrent  CHEMOTHERAPY INTENT: Palliative.  CURRENT # OF CHEMOTHERAPY CYCLES: 1  CURRENT ANTIEMETICS: None  CURRENT SMOKING STATUS: Non-smoker  ORAL CHEMOTHERAPY AND CONSENT: None  CURRENT BISPHOSPHONATES USE: None  LIVING WILL AND CODE STATUS: Full code.  INTERVAL HISTORY: [name] 69 y.o. male returns to the clinic today for followup visit accompanied by his wife. The patient was started on treatment with Tarceva 150 mg by mouth daily status post 4 weeks of treatment. He is tolerating his treatment fairly well except for papulomacular skin rash on the face, chest and scalp which is currently grade 2. He was given prescription for clindamycin lotion in addition to doxycycline. Has a skin rash on the face and chest has improved. He has increased the skin rash on the back and scalp. He also has some breaks in the skin of the fingers and sole of his right foot. He is feeling fine today with no other complaints. He denied having any significant chest pain, shortness of breath, cough or hemoptysis. The patient denied having any fever or chills, no nausea or vomiting and no diarrhea. He denied having any significant weight loss or night sweats.  MEDICAL HISTORY: Past Medical History  Diagnosis Date  . Hypertension   .  Hypercholesterolemia   . Sinus disease     hx of  . History of pneumonia  last 3-4 yrs ago    3 -4 different times  . Hypothyroidism   . Sleep apnea     uses cpap setting opf 12  . History of agent Orange exposure 44-45 yrs ago  . Complication of anesthesia 2007    quit breathing with bronchoscopy, had to spend night  . lung ca dx'd 2007    lung right    ALLERGIES:  is allergic to lisinopril.  MEDICATIONS:  Current Outpatient Prescriptions  Medication Sig Dispense Refill  . amitriptyline (ELAVIL) 50 MG tablet Take 50 mg by mouth at bedtime.    [name] aspirin 81 MG tablet Take 81 mg by mouth every morning.     . cholecalciferol (VITAMIN D) 1000 UNITS tablet Take 1,000 Units by mouth daily.    . clindamycin (CLEOCIN-T) 1 % external solution Apply topically 2 (two) times daily. 240 mL 1  . diltiazem (CARDIZEM CD) 360 MG 24 hr capsule Take 360 mg by mouth every morning.     . docusate sodium (COLACE) 100 MG capsule Take 100 mg by mouth 2 (two) times daily.    [name] doxycycline (VIBRAMYCIN) 100 MG capsule Take 1 capsule (100 mg total) by mouth 2 (two) times daily. 60 capsule 1  . erlotinib (TARCEVA) 150 MG tablet Take 1 tablet (150 mg total) by mouth daily. Take on an empty stomach 1 hour before meals or 2 hours after. 30 tablet 0  . hydrocortisone cream 1 % Apply 1 application topically 2 (  two) times daily. Per on call provider    . levETIRAcetam (KEPPRA) 500 MG tablet Take 750 mg by mouth daily.     . Levothyroxine Sodium 50 MCG CAPS Take 75 mcg by mouth daily before breakfast.     . metoCLOPramide (REGLAN) 10 MG tablet Take 10 mg by mouth every 6 (six) hours as needed for nausea.     . metoprolol succinate (TOPROL-XL) 25 MG 24 hr tablet Take 25 mg by mouth every morning.     . Multiple Vitamins-Minerals (CENTRUM SILVER PO) Take 1 tablet by mouth daily.     . naproxen sodium (ANAPROX) 550 MG tablet Take 550 mg by mouth 3 (three) times daily as needed for moderate pain.     . nitroGLYCERIN  (NITROSTAT) 0.4 MG SL tablet Place 0.4 mg under the tongue every 5 (five) minutes as needed for chest pain.     . Omega-3 Fat Ac-Cholecalciferol (MINICAPS VITAMIN-D/OMEGA-3) [phone] MG-UNIT CAPS Take 1 tablet by mouth daily.     . psyllium (REGULOID) 0.52 G capsule Take 0.52 g by mouth daily.      . rosuvastatin (CRESTOR) 5 MG tablet Take 5 mg by mouth every morning.     . valsartan-hydrochlorothiazide (DIOVAN-HCT) 320-25 MG per tablet Take 1 tablet by mouth every morning.      No current facility-administered medications for this visit.    SURGICAL HISTORY:  Past Surgical History  Procedure Laterality Date  . Cystourethroscopy, gyrus turp.  04/16/2010  . Torn medial and lateral menisci, left knee.  11/06/2006  . Right video-assisted thoracoscopy, right upper lobectomy and  01/23/2006  . The olympus video bronchoscope was introduced via the right  12/21/2005  . Nasal sinus surgery  1987  . Tonsillectomy  1957  . Colonoscopy with propofol N/A 03/19/2013    Procedure: COLONOSCOPY WITH PROPOFOL;  Surgeon: Garlan Fair, MD;  Location: [facility];  Service: Endoscopy;  Laterality: N/A;    REVIEW OF SYSTEMS:  A comprehensive review of systems was negative except for: Integument/breast: positive for dryness and rash   PHYSICAL EXAMINATION: General appearance: alert, cooperative and no distress Head: Normocephalic, without obvious abnormality, atraumatic Neck: no adenopathy Lymph nodes: Cervical, supraclavicular, and axillary nodes normal. Resp: clear to auscultation bilaterally Back: symmetric, no curvature. ROM normal. No CVA tenderness. Cardio: regular rate and rhythm, S1, S2 normal, no murmur, click, rub or gallop GI: soft, non-tender; bowel sounds normal; no masses,  no organomegaly Extremities: extremities normal, atraumatic, no cyanosis or edema Neurologic: Alert and oriented X 3, normal strength and tone. Normal symmetric reflexes. Normal coordination and gait  ECOG  PERFORMANCE STATUS: 1 - Symptomatic but completely ambulatory  Blood pressure 144/97, pulse 89, resp. rate 19, height 6' (1.829 m), weight 239 lb 4.8 oz (108.546 kg), SpO2 99 %.  LABORATORY DATA: Lab Results  Component Value Date   WBC 6.5 07/03/2014   HGB 13.9 07/03/2014   HCT 41.9 07/03/2014   MCV 99.2* 07/03/2014   PLT 295 07/03/2014      Chemistry      Component Value Date/Time   NA 139 06/19/2014 0937   NA 137 05/14/2014 0730   NA 139 10/20/2011 0903   K 3.6 06/19/2014 0937   K 3.6 05/14/2014 0730   K 3.4 10/20/2011 0903   CL 101 05/14/2014 0730   CL 102 04/24/2012 0853   CL 101 10/20/2011 0903   CO2 26 06/19/2014 0937   CO2 29 05/14/2014 0730   CO2 29 10/20/2011 0903  BUN 14.9 06/19/2014 0937   BUN 14 05/14/2014 0730   BUN 12 10/20/2011 0903   CREATININE 0.9 06/19/2014 0937   CREATININE 0.94 05/14/2014 0730   CREATININE 0.9 10/20/2011 0903      Component Value Date/Time   CALCIUM 9.4 06/19/2014 0937   CALCIUM 9.4 05/14/2014 0730   CALCIUM 9.0 10/20/2011 0903   ALKPHOS 66 06/19/2014 0937   ALKPHOS 52 10/20/2011 0903   ALKPHOS 53 06/25/2010 0758   AST 23 06/19/2014 0937   AST 24 10/20/2011 0903   AST 22 06/25/2010 0758   ALT 25 06/19/2014 0937   ALT 31 10/20/2011 0903   ALT 22 06/25/2010 0758   BILITOT 0.51 06/19/2014 0937   BILITOT 0.60 10/20/2011 0903   BILITOT 0.3 06/25/2010 0758       RADIOGRAPHIC STUDIES: No results found. ASSESSMENT AND PLAN: This is a very pleasant 69 years old white male with recurrent non-small cell lung cancer, adenocarcinoma with positive EGFR mutation in exon 21 that was initially diagnosed as stage IA non-small cell lung cancer status post right upper lobectomy and has been observation since October of 2007. The patient is currently on treatment with Tarceva 150 mg by mouth daily according to the BMS checkmate 370 clinical trial. He is tolerating the treatment well except for the skin rash. The patient will continue on  doxycycline orally, clindamycin lotion. I will also consider him for Medrol Dosepak. If no improvement in his skin rash, I may consider reducing his dose of Tarceva to 100 mg by mouth daily. He would come back for follow-up visit in 2 weeks for reevaluation and management of any adverse effect of his treatment. The patient was advised to call immediately if he has any other concerning symptoms in the interval.  All questions were answered. The patient knows to call the clinic with any problems, questions or concerns. We can certainly see the patient much sooner if necessary.  Disclaimer: This note was dictated with voice recognition software. Similar sounding words can inadvertently be transcribed and may not be corrected upon review.

## 2014-07-03 NOTE — Progress Notes (Signed)
Expand All Collapse All   07/03/14 1010  BMS 370 study, Group D, Arm A erlotinib only, cycle 3, day 1 Patient returns to clinic for MD visit after having been on study drug, erlotinib, since start date of 06/06/14. Patient completed the FACT-L and EQ-5D prior to his  labs and MD visit. He reports that he has not missed any doses of study drug and confirms he has taken it as prescribed. His skin rash, grade 2,has continued on his chest, face, and scalp. He complains of scalp pain from the rash which is red, raised, and sore on his head. He has been using the prescribed hydrocortisone cream, Cleocin cream, and doxycycline for the rash an states it has improved a little.  His feet and hands have started to get dry and sore.  He has a crack on the bottom of his foot that is sore.  He is using Cerave lotion on his hands and feet.   Patient states he no longer has constipation and that his bowels have been moving normally since starting the Miralax and Metamucil. He still has insomnia due to scalp discomfort. He was seen by MD and received a new prescription for a Medrol dose pack, which has been sent to [facility] for approval.  He understands to contact MD if he has worsening symptoms. He denies problems with dyspnea, cough, nausea/vomiting, diarrhea, or other complaints at today's visit.  Marcellus Scott, RN Clinical Research Nurse  07/03/2014 [phone]  Spoke with the Medical Monitor at [facility], Dr. Kalman Shan, who said it was fine for the patient to take Medrol dose pack for grade 2 skin rash.  Patient notified. Marcellus Scott, RN

## 2014-07-03 NOTE — Progress Notes (Signed)
BMS [id number] - questionnaires (PROs) - patient into the cancer center for routine visit. Patient was given PROs upon arrival to the cancer center. The patient completed his PROs (EQ-5D-3L first and then the FACT-L) before any study procedures were performed. I checked the PROs for completeness.  Patient's research blood was drawn 06/06/2014.  The patient was thanked for his continued support of this clinical trial. Barb Doyce Stonehouse 07/03/2014 9:32 AM

## 2014-07-04 ENCOUNTER — Telehealth: Payer: Self-pay | Admitting: Internal Medicine

## 2014-07-04 ENCOUNTER — Telehealth: Payer: Self-pay | Admitting: *Deleted

## 2014-07-04 ENCOUNTER — Encounter: Payer: Self-pay | Admitting: *Deleted

## 2014-07-04 NOTE — Telephone Encounter (Signed)
Per pof for scan date i placed a note on the order to schedule for 5/3

## 2014-07-04 NOTE — Progress Notes (Signed)
07/04/2014 1125 Per Dr. [name], okay to schedule patient's CT scan for first week in May, around 08/05/14. Marcellus Scott, RN

## 2014-07-04 NOTE — Telephone Encounter (Signed)
07/04/2014 1350 Spoke with patient by phone and confirmed he is using topical Cleocin cream (10 mg per ml of Cleocin) twice a day as prescribed.  He stated he is using a total of 30 ml per week which equals 4.28 ml per day. Marcellus Scott, RN

## 2014-07-07 NOTE — Progress Notes (Signed)
07/03/2014 0948 The patient's temperature was 98.2 degrees F. Marcellus Scott, RN

## 2014-07-08 ENCOUNTER — Telehealth: Payer: Self-pay | Admitting: Internal Medicine

## 2014-07-08 NOTE — Telephone Encounter (Signed)
mailed pt appt sched and letter

## 2014-07-15 ENCOUNTER — Other Ambulatory Visit: Payer: Self-pay | Admitting: *Deleted

## 2014-07-15 DIAGNOSIS — C3431 Malignant neoplasm of lower lobe, right bronchus or lung: Secondary | ICD-10-CM

## 2014-07-17 ENCOUNTER — Other Ambulatory Visit (HOSPITAL_BASED_OUTPATIENT_CLINIC_OR_DEPARTMENT_OTHER): Payer: Medicare Other

## 2014-07-17 ENCOUNTER — Encounter: Payer: Self-pay | Admitting: Internal Medicine

## 2014-07-17 ENCOUNTER — Encounter: Payer: Self-pay | Admitting: Physician Assistant

## 2014-07-17 ENCOUNTER — Telehealth: Payer: Self-pay | Admitting: Physician Assistant

## 2014-07-17 ENCOUNTER — Encounter: Payer: Self-pay | Admitting: *Deleted

## 2014-07-17 ENCOUNTER — Other Ambulatory Visit (HOSPITAL_COMMUNITY)
Admission: RE | Admit: 2014-07-17 | Discharge: 2014-07-17 | Disposition: A | Discharge status: Home / Self Care | Payer: Medicare Other | Source: Ambulatory Visit | Attending: Internal Medicine | Admitting: Internal Medicine

## 2014-07-17 ENCOUNTER — Ambulatory Visit (HOSPITAL_BASED_OUTPATIENT_CLINIC_OR_DEPARTMENT_OTHER): Payer: Medicare Other | Admitting: Physician Assistant

## 2014-07-17 VITALS — BP 133/84 | HR 78 | Temp 97.8°F | Resp 19 | Ht 72.0 in | Wt 241.3 lb

## 2014-07-17 DIAGNOSIS — C3431 Malignant neoplasm of lower lobe, right bronchus or lung: Secondary | ICD-10-CM

## 2014-07-17 DIAGNOSIS — R946 Abnormal results of thyroid function studies: Secondary | ICD-10-CM | POA: Diagnosis not present

## 2014-07-17 DIAGNOSIS — Z006 Encounter for examination for normal comparison and control in clinical research program: Secondary | ICD-10-CM

## 2014-07-17 DIAGNOSIS — C3430 Malignant neoplasm of lower lobe, unspecified bronchus or lung: Secondary | ICD-10-CM | POA: Diagnosis not present

## 2014-07-17 LAB — CBC WITH DIFFERENTIAL/PLATELET
BASO%: 0.8 % (ref 0.0–2.0)
Basophils Absolute: 0 10*3/uL (ref 0.0–0.1)
EOS%: 6.1 % (ref 0.0–7.0)
Eosinophils Absolute: 0.4 10*3/uL (ref 0.0–0.5)
HEMATOCRIT: 38.3 % — AB (ref 38.4–49.9)
HGB: 12.9 g/dL — ABNORMAL LOW (ref 13.0–17.1)
LYMPH%: 28.1 % (ref 14.0–49.0)
MCH: 33.2 pg (ref 27.2–33.4)
MCHC: 33.6 g/dL (ref 32.0–36.0)
MCV: 98.7 fL — ABNORMAL HIGH (ref 79.3–98.0)
MONO#: 0.6 10*3/uL (ref 0.1–0.9)
MONO%: 10.2 % (ref 0.0–14.0)
NEUT#: 3.5 10*3/uL (ref 1.5–6.5)
NEUT%: 54.8 % (ref 39.0–75.0)
PLATELETS: 228 10*3/uL (ref 140–400)
RBC: 3.88 10*6/uL — ABNORMAL LOW (ref 4.20–5.82)
RDW: 12.8 % (ref 11.0–14.6)
WBC: 6.3 10*3/uL (ref 4.0–10.3)
lymph#: 1.8 10*3/uL (ref 0.9–3.3)

## 2014-07-17 LAB — COMPREHENSIVE METABOLIC PANEL
ALK PHOS: 53 U/L (ref 39–117)
ALT: 28 U/L (ref 0–53)
AST: 24 U/L (ref 0–37)
Albumin: 3.9 g/dL (ref 3.5–5.2)
Anion gap: 5 (ref 5–15)
BUN: 16 mg/dL (ref 6–23)
CO2: 26 mmol/L (ref 19–32)
Calcium: 8.9 mg/dL (ref 8.4–10.5)
Chloride: 107 mmol/L (ref 96–112)
Creatinine, Ser: 0.82 mg/dL (ref 0.50–1.35)
GFR, EST NON AFRICAN AMERICAN: 89 mL/min — AB (ref >90)
GLUCOSE: 98 mg/dL (ref 70–99)
POTASSIUM: 3.3 mmol/L — AB (ref 3.5–5.1)
Sodium: 138 mmol/L (ref 135–145)
TOTAL PROTEIN: 6.1 g/dL (ref 6.0–8.3)
Total Bilirubin: 0.8 mg/dL (ref 0.3–1.2)

## 2014-07-17 LAB — TSH: TSH: 2.098 u[IU]/mL (ref 0.350–4.500)

## 2014-07-17 LAB — PHOSPHORUS: Phosphorus: 2.5 mg/dL (ref 2.3–4.6)

## 2014-07-17 LAB — MAGNESIUM: MAGNESIUM: 2.2 mg/dL (ref 1.5–2.5)

## 2014-07-17 LAB — RESEARCH LABS

## 2014-07-17 LAB — LACTATE DEHYDROGENASE: LDH: 153 U/L (ref 94–250)

## 2014-07-17 MED ORDER — ERLOTINIB HCL 100 MG PO TABS: 100.0000 mg | ORAL_TABLET | Freq: Every day | ORAL | Status: DC

## 2014-07-17 MED ORDER — METHYLPREDNISOLONE (PAK) 4 MG PO TABS: ORAL_TABLET | ORAL | Status: DC

## 2014-07-17 NOTE — Telephone Encounter (Signed)
Appointments made and avs printed for patient °

## 2014-07-17 NOTE — Progress Notes (Signed)
BMS [id number] - questionnaires (PROs) - patient into the cancer center for routine visit. Patient was given PROs upon arrival to the cancer center. The patient completed his PROs (EQ-5D-3L first and then the FACT-L booklet) before any study procedures were performed. I checked the PROs for completeness.  Research blood was drawn.  The patient was thanked for his continued support of this clinical trial. Barb Taija Mathias 07/17/2014.09:52A

## 2014-07-17 NOTE — Progress Notes (Signed)
[place] Telephone:(336) [phone]   Fax:(336) [phone]  OFFICE PROGRESS NOTE  [name],[name], MD 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 Roxan Hockey on 01/23/2006.   CURRENT THERAPY: Tarceva 150 mg by mouth daily as part of the BMS checkmate 370 clinical trial. Status post approximately 6 weeks of treatment.  DISEASE STAGE: Recurrent  CHEMOTHERAPY INTENT: Palliative.  CURRENT # OF CHEMOTHERAPY CYCLES: 1  CURRENT ANTIEMETICS: None  CURRENT SMOKING STATUS: Non-smoker  ORAL CHEMOTHERAPY AND CONSENT: None  CURRENT BISPHOSPHONATES USE: None  LIVING WILL AND CODE STATUS: Full code.  INTERVAL HISTORY: [name] 69 y.o. male returns to the clinic today for followup visit accompanied by his wife. The patient was started on treatment with Tarceva 150 mg by mouth daily status post approximately 6 weeks of treatment. He was evaluated 2 weeks ago for more significant skin rash on his scalp, face chest and back, likely stage 3. He completed his medrol dose pack as prescribed with some improvement in his overall rash. He is also takes doxycycline and continues to use clindamycin lotion. Shortly after completing the steroid taper he developed significant redness on his legs.  He is feeling fine today with no other complaints. He denied having any significant chest pain, shortness of breath, cough or hemoptysis. The patient denied having any fever or chills, no nausea or vomiting and no diarrhea. He denied having any significant weight loss or night sweats. He states that he is planning to go to the beach for a week starting  07/18/2014.  MEDICAL HISTORY: Past Medical History  Diagnosis Date  . Hypertension   . Hypercholesterolemia   . Sinus disease     hx of  . History of pneumonia  last 3-4 yrs ago    3 -4 different times  . Hypothyroidism   . Sleep apnea     uses cpap setting opf 12  . History of agent Orange exposure 44-45 yrs ago  . Complication of anesthesia 2007    quit breathing with bronchoscopy, had to spend night  . lung ca dx'd 2007    lung right    ALLERGIES:  is allergic to lisinopril.  MEDICATIONS:  Current Outpatient Prescriptions  Medication Sig Dispense Refill  . amitriptyline (ELAVIL) 50 MG tablet Take 50 mg by mouth at bedtime.    [name] aspirin 81 MG tablet Take 81 mg by mouth every morning.     . cholecalciferol (VITAMIN D) 1000 UNITS tablet Take 1,000 Units by mouth daily.    . clindamycin (CLEOCIN-T) 1 % external solution Apply topically 2 (two) times daily. 240 mL 1  . diltiazem (CARDIZEM CD) 360 MG 24 hr capsule Take 360 mg by mouth every morning.     [name] doxycycline (VIBRAMYCIN) 100 MG capsule Take 1 capsule (100 mg total) by mouth 2 (two) times daily. 60 capsule 1  . levETIRAcetam (KEPPRA) 500 MG tablet Take 750 mg by mouth daily.     . Levothyroxine Sodium 50 MCG CAPS Take 75 mcg by mouth daily before breakfast.     . methylPREDNIsolone (MEDROL DOSPACK) 4 MG tablet follow package directions 21 tablet 0  . metoCLOPramide (REGLAN) 10 MG tablet Take  10 mg by mouth every 6 (six) hours as needed for nausea.     . metoprolol succinate (TOPROL-XL) 25 MG 24 hr tablet Take 25 mg by mouth every morning.     . Multiple Vitamins-Minerals (CENTRUM SILVER PO) Take 1 tablet by mouth daily.     . naproxen sodium (ANAPROX) 550 MG tablet Take 550 mg by mouth 3 (three) times daily as needed for moderate pain.     . Omega-3 Fat Ac-Cholecalciferol (MINICAPS VITAMIN-D/OMEGA-3) [phone] MG-UNIT CAPS Take 1 tablet by mouth daily.     . polyethylene glycol (MIRALAX / GLYCOLAX) packet Take 17 g by  mouth daily.    . psyllium (METAMUCIL) 58.6 % powder Take 1 packet by mouth 3 (three) times daily.    . rosuvastatin (CRESTOR) 5 MG tablet Take 5 mg by mouth every morning.     . valsartan-hydrochlorothiazide (DIOVAN-HCT) 320-25 MG per tablet Take 1 tablet by mouth every morning.     . erlotinib (TARCEVA) 100 MG tablet Take 1 tablet (100 mg total) by mouth daily. Take on an empty stomach 1 hour before meals or 2 hours after 30 tablet 1  . nitroGLYCERIN (NITROSTAT) 0.4 MG SL tablet Place 0.4 mg under the tongue every 5 (five) minutes as needed for chest pain.      No current facility-administered medications for this visit.    SURGICAL HISTORY:  Past Surgical History  Procedure Laterality Date  . Cystourethroscopy, gyrus turp.  04/16/2010  . Torn medial and lateral menisci, left knee.  11/06/2006  . Right video-assisted thoracoscopy, right upper lobectomy and  01/23/2006  . The olympus video bronchoscope was introduced via the right  12/21/2005  . Nasal sinus surgery  1987  . Tonsillectomy  1957  . Colonoscopy with propofol N/A 03/19/2013    Procedure: COLONOSCOPY WITH PROPOFOL;  Surgeon: Garlan Fair, MD;  Location: [facility];  Service: Endoscopy;  Laterality: N/A;    REVIEW OF SYSTEMS:  A comprehensive review of systems was negative except for: Integument/breast: positive for dryness and rash   PHYSICAL EXAMINATION: General appearance: alert, cooperative and no distress Head: Normocephalic, without obvious abnormality, atraumatic Neck: no adenopathy Lymph nodes: Cervical, supraclavicular, and axillary nodes normal. Resp: clear to auscultation bilaterally Back: symmetric, no curvature. ROM normal. No CVA tenderness. Cardio: regular rate and rhythm, S1, S2 normal, no murmur, click, rub or gallop GI: soft, non-tender; bowel sounds normal; no masses,  no organomegaly Extremities: extremities normal, atraumatic, no cyanosis or edema Neurologic: Alert and oriented X 3, normal  strength and tone. Normal symmetric reflexes. Normal coordination and gait Skin: Grade 2 acneform eruptions with the greatest concentration on the back and to a lesser extent involving the chest, face and scalp. There are large patches of erythematous macular lesions located on both calves. No evidence of infection  ECOG PERFORMANCE STATUS: 1 - Symptomatic but completely ambulatory  Blood pressure 133/84, pulse 78, temperature 97.8 F (36.6 C), temperature source Oral, resp. rate 19, height 6' (1.829 m), weight 241 lb 4.8 oz (109.453 kg), SpO2 98 %.  LABORATORY DATA: Lab Results  Component Value Date   WBC 6.3 07/17/2014   HGB 12.9* 07/17/2014   HCT 38.3* 07/17/2014   MCV 98.7* 07/17/2014   PLT 228 07/17/2014      Chemistry      Component Value Date/Time   NA 138 07/17/2014 1000   NA 139 07/03/2014 0927   NA 139 10/20/2011 0903   K 3.3* 07/17/2014 1000   K 3.6  07/03/2014 0927   K 3.4 10/20/2011 0903   CL 107 07/17/2014 1000   CL 102 04/24/2012 0853   CL 101 10/20/2011 0903   CO2 26 07/17/2014 1000   CO2 24 07/03/2014 0927   CO2 29 10/20/2011 0903   BUN 16 07/17/2014 1000   BUN 13.6 07/03/2014 0927   BUN 12 10/20/2011 0903   CREATININE 0.82 07/17/2014 1000   CREATININE 0.9 07/03/2014 0927   CREATININE 0.9 10/20/2011 0903      Component Value Date/Time   CALCIUM 8.9 07/17/2014 1000   CALCIUM 9.0 07/03/2014 0927   CALCIUM 9.0 10/20/2011 0903   ALKPHOS 53 07/17/2014 1000   ALKPHOS 69 07/03/2014 0927   ALKPHOS 52 10/20/2011 0903   AST 24 07/17/2014 1000   AST 59* 07/03/2014 0927   AST 24 10/20/2011 0903   ALT 28 07/17/2014 1000   ALT 82* 07/03/2014 0927   ALT 31 10/20/2011 0903   BILITOT 0.8 07/17/2014 1000   BILITOT 0.43 07/03/2014 0927   BILITOT 0.60 10/20/2011 0903       RADIOGRAPHIC STUDIES: No results found. ASSESSMENT AND PLAN: This is a very pleasant 69 years old white male with recurrent non-small cell lung cancer, adenocarcinoma with positive EGFR  mutation in exon 21 that was initially diagnosed as stage IA non-small cell lung cancer status post right upper lobectomy and has been observation since October of 2007. The patient is currently on treatment with Tarceva 150 mg by mouth daily according to the BMS checkmate 370 clinical trial. He is tolerating the treatment well except for the skin rash. The patient was discussed with and also seen by Dr. [name]. The skin rash is now a grade 2. He will continue on doxycycline orally, clindamycin lotion.We will place him on another Medrol dose pak . We will also reduce his Tarceva to 100 mg by mouth daily. Prescriptions for the Tarceva 100 mg and Medrol dose pak were sent to his pharmacies of record. He will come back for a follow-up visit in 2 weeks for reevaluation and management of any adverse effect of his treatment. The patient was advised to call immediately if he has any other concerning symptoms in the interval.  All questions were answered. The patient knows to call the clinic with any problems, questions or concerns. We can certainly see the patient much sooner if necessary.  Carlton Adam, PA-C 07/17/2014   Disclaimer: This note was dictated with voice recognition software. Similar sounding words can inadvertently be transcribed and may not be corrected upon review.

## 2014-07-17 NOTE — Progress Notes (Signed)
07/17/2014  1030 BMS 370 cycle 4, week 6  Patient in to the clinic today accompanied by his wife. Patient completed the FACT-L and EQ-5D prior to his labs and MD visit. He reported that he has not missed any doses of study drug and confirmed he has taken it as prescribed. Patient denied diarrhea, constipation, nausea/vomiting, dyspnea, or SOB.  He complained of rash which is now on bilateral legs. He has been using the prescribed hydrocortisone cream, Cleocin cream, and doxycycline for the rash and stated it has improved a little He stated that his rash improved during the time he was taking his steroids from 4/1-07/09/2014.  He stated that the rash broke out on his legs on 07/13/14.   Dr. [name] assessed patient and stated that since he went from a grade 3 rash (improved on steroids) down to a grade 2, he will dose reduce with -50 mg dose decrement. The patient denied other complaints at today's visit.  Based on today's labs and PE, patient to continue on erlotinib at reduced dose per Dr. [name].   Marcellus Scott, RN Clinical Research

## 2014-07-17 NOTE — Progress Notes (Signed)
07/03/2014 Cycle 3, day 1 visit Correction to documentation.  On 07/03/14 the patient had a skin rash grade 3.  This was verified with Dr. [name]. Marcellus Scott, RN Clinical Research Nurse

## 2014-07-18 ENCOUNTER — Other Ambulatory Visit: Payer: Self-pay | Admitting: *Deleted

## 2014-07-20 NOTE — Patient Instructions (Signed)
Take the Medrol Dose pak as prescribed We are reducing the dose of your Tarceva to 100 mg by mouth daily secondary to your significant skin rashes at the 150 mg dose Follow up in 2 weeks

## 2014-07-21 ENCOUNTER — Other Ambulatory Visit: Payer: Self-pay | Admitting: *Deleted

## 2014-07-22 NOTE — Progress Notes (Signed)
07/22/2014   Patient had a resting O2 sat. of 98% on 07/17/14 at 1003. Marcellus Scott, RN Clinical Research

## 2014-07-31 ENCOUNTER — Other Ambulatory Visit (HOSPITAL_COMMUNITY)
Admission: RE | Admit: 2014-07-31 | Discharge: 2014-07-31 | Disposition: A | Discharge status: Home / Self Care | Payer: Medicare Other | Source: Ambulatory Visit | Attending: Internal Medicine | Admitting: Internal Medicine

## 2014-07-31 ENCOUNTER — Encounter: Payer: Self-pay | Admitting: Internal Medicine

## 2014-07-31 ENCOUNTER — Ambulatory Visit (HOSPITAL_BASED_OUTPATIENT_CLINIC_OR_DEPARTMENT_OTHER): Payer: Medicare Other | Admitting: Internal Medicine

## 2014-07-31 ENCOUNTER — Ambulatory Visit (HOSPITAL_BASED_OUTPATIENT_CLINIC_OR_DEPARTMENT_OTHER): Payer: Medicare Other

## 2014-07-31 ENCOUNTER — Telehealth: Payer: Self-pay | Admitting: Internal Medicine

## 2014-07-31 ENCOUNTER — Encounter: Payer: Self-pay | Admitting: *Deleted

## 2014-07-31 VITALS — BP 129/78 | HR 67 | Temp 97.7°F | Resp 18 | Ht 72.0 in | Wt 245.1 lb

## 2014-07-31 DIAGNOSIS — C3490 Malignant neoplasm of unspecified part of unspecified bronchus or lung: Secondary | ICD-10-CM | POA: Diagnosis not present

## 2014-07-31 DIAGNOSIS — C3431 Malignant neoplasm of lower lobe, right bronchus or lung: Secondary | ICD-10-CM

## 2014-07-31 DIAGNOSIS — Z85118 Personal history of other malignant neoplasm of bronchus and lung: Secondary | ICD-10-CM

## 2014-07-31 DIAGNOSIS — Z006 Encounter for examination for normal comparison and control in clinical research program: Secondary | ICD-10-CM | POA: Diagnosis not present

## 2014-07-31 DIAGNOSIS — Z902 Acquired absence of lung [part of]: Secondary | ICD-10-CM

## 2014-07-31 LAB — CBC WITH DIFFERENTIAL/PLATELET
BASO%: 0.6 % (ref 0.0–2.0)
Basophils Absolute: 0 10*3/uL (ref 0.0–0.1)
EOS%: 7.5 % — AB (ref 0.0–7.0)
Eosinophils Absolute: 0.5 10*3/uL (ref 0.0–0.5)
HCT: 37.6 % — ABNORMAL LOW (ref 38.4–49.9)
HGB: 12.8 g/dL — ABNORMAL LOW (ref 13.0–17.1)
LYMPH%: 34.6 % (ref 14.0–49.0)
MCH: 33.8 pg — ABNORMAL HIGH (ref 27.2–33.4)
MCHC: 34 g/dL (ref 32.0–36.0)
MCV: 99.2 fL — AB (ref 79.3–98.0)
MONO#: 0.7 10*3/uL (ref 0.1–0.9)
MONO%: 10.7 % (ref 0.0–14.0)
NEUT#: 3 10*3/uL (ref 1.5–6.5)
NEUT%: 46.6 % (ref 39.0–75.0)
Platelets: 207 10*3/uL (ref 140–400)
RBC: 3.79 10*6/uL — AB (ref 4.20–5.82)
RDW: 13 % (ref 11.0–14.6)
WBC: 6.4 10*3/uL (ref 4.0–10.3)
lymph#: 2.2 10*3/uL (ref 0.9–3.3)

## 2014-07-31 LAB — COMPREHENSIVE METABOLIC PANEL (CC13)
ALT: 33 U/L (ref 0–55)
AST: 24 U/L (ref 5–34)
Albumin: 3.6 g/dL (ref 3.5–5.0)
Alkaline Phosphatase: 57 U/L (ref 40–150)
Anion Gap: 11 mEq/L (ref 3–11)
BUN: 14.3 mg/dL (ref 7.0–26.0)
CALCIUM: 9 mg/dL (ref 8.4–10.4)
CO2: 24 meq/L (ref 22–29)
Chloride: 107 mEq/L (ref 98–109)
Creatinine: 0.9 mg/dL (ref 0.7–1.3)
EGFR: 89 mL/min/{1.73_m2} — ABNORMAL LOW (ref >90)
Glucose: 101 mg/dl (ref 70–140)
Potassium: 3.7 mEq/L (ref 3.5–5.1)
SODIUM: 141 meq/L (ref 136–145)
TOTAL PROTEIN: 6.1 g/dL — AB (ref 6.4–8.3)
Total Bilirubin: 0.45 mg/dL (ref 0.20–1.20)

## 2014-07-31 LAB — MAGNESIUM (CC13): Magnesium: 2.4 mg/dl (ref 1.5–2.5)

## 2014-07-31 LAB — LACTATE DEHYDROGENASE (CC13): LDH: 213 U/L (ref 125–245)

## 2014-07-31 LAB — PHOSPHORUS: PHOSPHORUS: 2.7 mg/dL (ref 2.3–4.6)

## 2014-07-31 NOTE — Progress Notes (Signed)
BMS [id number] - questionnaires (PROs) - patient into the cancer center for routine visit. Patient was given PROs upon arrival to the cancer center. The patient completed his PROs (EQ-5D-3L first and then the FACT-L) before any study procedures were performed. I checked the PROs for completeness.  Patient's research blood was drawn 07/17/2014.  The patient was thanked for his continued support of this clinical trial. Barb Gara Kincade 07/31/2014.08:21AM

## 2014-07-31 NOTE — Telephone Encounter (Signed)
Gave avs & calendar for May. Also gave CT contrast for scan.

## 2014-07-31 NOTE — Progress Notes (Signed)
[place] Telephone:([phone]) [phone]   Fax:(336) [phone]  OFFICE PROGRESS NOTE  [name],[name], MD 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 Roxan Hockey on 01/23/2006.  2) Tarceva 150 mg by mouth daily as part of the BMS checkmate 370 clinical trial. Status post 6 weeks of treatment.   CURRENT THERAPY: Tarceva 100 mg by mouth daily as part of the BMS checkmate 370 clinical trial. Status post 2 weeks of treatment.  DISEASE STAGE: Recurrent  CHEMOTHERAPY INTENT: Palliative.  CURRENT # OF CHEMOTHERAPY CYCLES: 1  CURRENT ANTIEMETICS: None  CURRENT SMOKING STATUS: Non-smoker  ORAL CHEMOTHERAPY AND CONSENT: None  CURRENT BISPHOSPHONATES USE: None  LIVING WILL AND CODE STATUS: Full code.  INTERVAL HISTORY: [name] 69 y.o. male returns to the clinic today for followup visit accompanied by his wife. His dose of Tarceva was reduced to 100 mg by mouth daily for the last 2 weeks. He is tolerating this dose much better but continues to have grade 1 skin rash mainly on the face. He continues to apply the clindamycin lotion to the skin rash on the face area. He is feeling fine today with no other complaints. He denied having any significant chest pain, shortness of breath, cough or hemoptysis. The patient denied having any fever or chills, no nausea or vomiting and no diarrhea. He denied having any significant weight loss or night sweats.  MEDICAL HISTORY: Past Medical History  Diagnosis Date  . Hypertension   . Hypercholesterolemia   . Sinus disease     hx of  . History of pneumonia  last 3-4 yrs ago    3 -4 different times  .  Hypothyroidism   . Sleep apnea     uses cpap setting opf 12  . History of agent Orange exposure 44-45 yrs ago  . Complication of anesthesia 2007    quit breathing with bronchoscopy, had to spend night  . lung ca dx'd 2007    lung right    ALLERGIES:  is allergic to lisinopril.  MEDICATIONS:  Current Outpatient Prescriptions  Medication Sig Dispense Refill  . amitriptyline (ELAVIL) 50 MG tablet Take 50 mg by mouth at bedtime.    [name] aspirin 81 MG tablet Take 81 mg by mouth every morning.     . cholecalciferol (VITAMIN D) 1000 UNITS tablet Take 1,000 Units by mouth daily.    . clindamycin (CLEOCIN-T) 1 % external solution Apply topically 2 (two) times daily. 240 mL 1  . diltiazem (CARDIZEM CD) 360 MG 24 hr capsule Take 360 mg by mouth every morning.     [name] doxycycline (VIBRAMYCIN) 100 MG capsule Take 1 capsule (100 mg total) by mouth 2 (two) times daily. 60 capsule 1  . erlotinib (TARCEVA) 100 MG tablet Take 1 tablet (100 mg total) by mouth daily. Take on an empty stomach 1 hour before meals or 2 hours after 30 tablet 1  . levETIRAcetam (KEPPRA) 500 MG tablet Take 750 mg by mouth daily.     . Levothyroxine Sodium 50 MCG CAPS Take 75 mcg by mouth daily before breakfast.     . metoprolol succinate (TOPROL-XL) 25 MG 24 hr tablet Take 25  mg by mouth every morning.     . Multiple Vitamins-Minerals (CENTRUM SILVER PO) Take 1 tablet by mouth daily.     . rosuvastatin (CRESTOR) 5 MG tablet Take 5 mg by mouth every morning.     . valsartan-hydrochlorothiazide (DIOVAN-HCT) 320-25 MG per tablet Take 1 tablet by mouth every morning.     . metoCLOPramide (REGLAN) 10 MG tablet Take 10 mg by mouth every 6 (six) hours as needed for nausea.     . naproxen sodium (ANAPROX) 550 MG tablet Take 550 mg by mouth 3 (three) times daily as needed for moderate pain.     . nitroGLYCERIN (NITROSTAT) 0.4 MG SL tablet Place 0.4 mg under the tongue every 5 (five) minutes as needed for chest pain.     . polyethylene glycol  (MIRALAX / GLYCOLAX) packet Take 17 g by mouth daily.    . psyllium (METAMUCIL) 58.6 % powder Take 1 packet by mouth 3 (three) times daily.     No current facility-administered medications for this visit.    SURGICAL HISTORY:  Past Surgical History  Procedure Laterality Date  . Cystourethroscopy, gyrus turp.  04/16/2010  . Torn medial and lateral menisci, left knee.  11/06/2006  . Right video-assisted thoracoscopy, right upper lobectomy and  01/23/2006  . The olympus video bronchoscope was introduced via the right  12/21/2005  . Nasal sinus surgery  1987  . Tonsillectomy  1957  . Colonoscopy with propofol N/A 03/19/2013    Procedure: COLONOSCOPY WITH PROPOFOL;  Surgeon: Garlan Fair, MD;  Location: [facility];  Service: Endoscopy;  Laterality: N/A;    REVIEW OF SYSTEMS:  A comprehensive review of systems was negative except for: Integument/breast: positive for dryness and rash   PHYSICAL EXAMINATION: General appearance: alert, cooperative and no distress Head: Normocephalic, without obvious abnormality, atraumatic Neck: no adenopathy Lymph nodes: Cervical, supraclavicular, and axillary nodes normal. Resp: clear to auscultation bilaterally Back: symmetric, no curvature. ROM normal. No CVA tenderness. Cardio: regular rate and rhythm, S1, S2 normal, no murmur, click, rub or gallop GI: soft, non-tender; bowel sounds normal; no masses,  no organomegaly Extremities: extremities normal, atraumatic, no cyanosis or edema Neurologic: Alert and oriented X 3, normal strength and tone. Normal symmetric reflexes. Normal coordination and gait  ECOG PERFORMANCE STATUS: 1 - Symptomatic but completely ambulatory  Blood pressure 129/78, pulse 67, temperature 97.7 F (36.5 C), temperature source Oral, resp. rate 18, height 6' (1.829 m), weight 245 lb 1.6 oz (111.177 kg), SpO2 99 %.  LABORATORY DATA: Lab Results  Component Value Date   WBC 6.3 07/17/2014   HGB 12.9* 07/17/2014   HCT 38.3*  07/17/2014   MCV 98.7* 07/17/2014   PLT 228 07/17/2014      Chemistry      Component Value Date/Time   NA 138 07/17/2014 1000   NA 139 07/03/2014 0927   NA 139 10/20/2011 0903   K 3.3* 07/17/2014 1000   K 3.6 07/03/2014 0927   K 3.4 10/20/2011 0903   CL 107 07/17/2014 1000   CL 102 04/24/2012 0853   CL 101 10/20/2011 0903   CO2 26 07/17/2014 1000   CO2 24 07/03/2014 0927   CO2 29 10/20/2011 0903   BUN 16 07/17/2014 1000   BUN 13.6 07/03/2014 0927   BUN 12 10/20/2011 0903   CREATININE 0.82 07/17/2014 1000   CREATININE 0.9 07/03/2014 0927   CREATININE 0.9 10/20/2011 0903      Component Value Date/Time   CALCIUM 8.9 07/17/2014 1000  CALCIUM 9.0 07/03/2014 0927   CALCIUM 9.0 10/20/2011 0903   ALKPHOS 53 07/17/2014 1000   ALKPHOS 69 07/03/2014 0927   ALKPHOS 52 10/20/2011 0903   AST 24 07/17/2014 1000   AST 59* 07/03/2014 0927   AST 24 10/20/2011 0903   ALT 28 07/17/2014 1000   ALT 82* 07/03/2014 0927   ALT 31 10/20/2011 0903   BILITOT 0.8 07/17/2014 1000   BILITOT 0.43 07/03/2014 0927   BILITOT 0.60 10/20/2011 0903       RADIOGRAPHIC STUDIES: No results found. ASSESSMENT AND PLAN: This is a very pleasant 69 years old white male with recurrent non-small cell lung cancer, adenocarcinoma with positive EGFR mutation in exon 21 that was initially diagnosed as stage IA non-small cell lung cancer status post right upper lobectomy and has been observation since October of 2007. He is status post 6 weeks' treatment with Tarceva 150 mg by mouth daily according to the BMS checkmate 370 clinical trial. The dose of Tarceva was reduced to 100 mg by mouth daily for the last 2 weeks and the patient is tolerating it much better. I recommended for the patient to continue his current treatment with Tarceva 100 mg by mouth daily. He would come back for follow-up visit in 2 weeks for reevaluation and management of any adverse effect of his treatment after repeat CT scan of the chest for  restaging of his disease. The patient was advised to call immediately if he has any other concerning symptoms in the interval.  All questions were answered. The patient knows to call the clinic with any problems, questions or concerns. We can certainly see the patient much sooner if necessary.  Disclaimer: This note was dictated with voice recognition software. Similar sounding words can inadvertently be transcribed and may not be corrected upon review.

## 2014-08-05 ENCOUNTER — Encounter (HOSPITAL_COMMUNITY): Payer: Self-pay

## 2014-08-05 ENCOUNTER — Ambulatory Visit (HOSPITAL_COMMUNITY)
Admission: RE | Admit: 2014-08-05 | Discharge: 2014-08-05 | Disposition: A | Discharge status: Home / Self Care | Payer: Medicare Other | Source: Ambulatory Visit | Attending: Internal Medicine | Admitting: Internal Medicine

## 2014-08-05 DIAGNOSIS — Z85118 Personal history of other malignant neoplasm of bronchus and lung: Secondary | ICD-10-CM | POA: Insufficient documentation

## 2014-08-05 DIAGNOSIS — K8020 Calculus of gallbladder without cholecystitis without obstruction: Secondary | ICD-10-CM | POA: Diagnosis not present

## 2014-08-05 DIAGNOSIS — Z902 Acquired absence of lung [part of]: Secondary | ICD-10-CM | POA: Diagnosis not present

## 2014-08-05 DIAGNOSIS — C3431 Malignant neoplasm of lower lobe, right bronchus or lung: Secondary | ICD-10-CM

## 2014-08-05 DIAGNOSIS — J984 Other disorders of lung: Secondary | ICD-10-CM | POA: Diagnosis not present

## 2014-08-05 MED ORDER — IOHEXOL 300 MG/ML  SOLN
100.0000 mL | Freq: Once | INTRAMUSCULAR | Status: AC | PRN
  Administered 2014-08-05: 100 mL via INTRAVENOUS

## 2014-08-07 ENCOUNTER — Other Ambulatory Visit: Payer: Self-pay | Admitting: *Deleted

## 2014-08-07 DIAGNOSIS — C3431 Malignant neoplasm of lower lobe, right bronchus or lung: Secondary | ICD-10-CM

## 2014-08-07 NOTE — Progress Notes (Signed)
07/31/14 BMS 370 Patient in to the clinic today accompanied by his wife. Patient completed the FACT-L and EQ-5D prior to his labs and MD visit. He reported that he has not missed any doses of study drug and confirmed he has taken it as prescribed. Patient denied diarrhea, constipation, nausea/vomiting, dyspnea, or SOB. He stated his rash has improved and is just on his face and scalp.  He states it is much better.  He still has grade 1 itching.  His scalp pain is still grade 1.  The patient denied other complaints at today's visit. Based on today's labs and PE, patient to continue on erlotinib at reduced dose per Dr. [name]. The patient will have re-staging CT on 08/05/14. Marcellus Scott, RN

## 2014-08-12 ENCOUNTER — Other Ambulatory Visit: Payer: Self-pay | Admitting: *Deleted

## 2014-08-12 ENCOUNTER — Encounter: Payer: Self-pay | Admitting: Internal Medicine

## 2014-08-12 DIAGNOSIS — C3431 Malignant neoplasm of lower lobe, right bronchus or lung: Secondary | ICD-10-CM

## 2014-08-12 DIAGNOSIS — G43111 Migraine with aura, intractable, with status migrainosus: Secondary | ICD-10-CM | POA: Diagnosis not present

## 2014-08-12 DIAGNOSIS — G43019 Migraine without aura, intractable, without status migrainosus: Secondary | ICD-10-CM | POA: Diagnosis not present

## 2014-08-14 ENCOUNTER — Encounter: Payer: Self-pay | Admitting: Internal Medicine

## 2014-08-14 ENCOUNTER — Ambulatory Visit (HOSPITAL_BASED_OUTPATIENT_CLINIC_OR_DEPARTMENT_OTHER): Payer: Medicare Other | Admitting: Internal Medicine

## 2014-08-14 ENCOUNTER — Telehealth: Payer: Self-pay | Admitting: Internal Medicine

## 2014-08-14 ENCOUNTER — Other Ambulatory Visit (HOSPITAL_BASED_OUTPATIENT_CLINIC_OR_DEPARTMENT_OTHER): Payer: Medicare Other

## 2014-08-14 ENCOUNTER — Other Ambulatory Visit (HOSPITAL_COMMUNITY)
Admission: RE | Admit: 2014-08-14 | Discharge: 2014-08-14 | Disposition: A | Discharge status: Home / Self Care | Payer: Medicare Other | Source: Ambulatory Visit | Attending: Internal Medicine | Admitting: Internal Medicine

## 2014-08-14 ENCOUNTER — Encounter: Payer: Medicare Other | Admitting: *Deleted

## 2014-08-14 VITALS — BP 132/75 | HR 65 | Temp 97.7°F | Resp 18 | Ht 72.0 in | Wt 244.3 lb

## 2014-08-14 DIAGNOSIS — C3431 Malignant neoplasm of lower lobe, right bronchus or lung: Secondary | ICD-10-CM

## 2014-08-14 DIAGNOSIS — Z006 Encounter for examination for normal comparison and control in clinical research program: Secondary | ICD-10-CM | POA: Diagnosis not present

## 2014-08-14 DIAGNOSIS — L270 Generalized skin eruption due to drugs and medicaments taken internally: Secondary | ICD-10-CM

## 2014-08-14 DIAGNOSIS — C3430 Malignant neoplasm of lower lobe, unspecified bronchus or lung: Secondary | ICD-10-CM | POA: Insufficient documentation

## 2014-08-14 LAB — CBC WITH DIFFERENTIAL/PLATELET
BASO%: 0.7 % (ref 0.0–2.0)
Basophils Absolute: 0 10*3/uL (ref 0.0–0.1)
EOS%: 6.7 % (ref 0.0–7.0)
Eosinophils Absolute: 0.3 10*3/uL (ref 0.0–0.5)
HEMATOCRIT: 37.9 % — AB (ref 38.4–49.9)
HGB: 13 g/dL (ref 13.0–17.1)
LYMPH#: 1.9 10*3/uL (ref 0.9–3.3)
LYMPH%: 38.9 % (ref 14.0–49.0)
MCH: 33.9 pg — ABNORMAL HIGH (ref 27.2–33.4)
MCHC: 34.4 g/dL (ref 32.0–36.0)
MCV: 98.3 fL — ABNORMAL HIGH (ref 79.3–98.0)
MONO#: 0.6 10*3/uL (ref 0.1–0.9)
MONO%: 12.3 % (ref 0.0–14.0)
NEUT%: 41.4 % (ref 39.0–75.0)
NEUTROS ABS: 2.1 10*3/uL (ref 1.5–6.5)
Platelets: 229 10*3/uL (ref 140–400)
RBC: 3.85 10*6/uL — ABNORMAL LOW (ref 4.20–5.82)
RDW: 13.2 % (ref 11.0–14.6)
WBC: 5 10*3/uL (ref 4.0–10.3)

## 2014-08-14 LAB — COMPREHENSIVE METABOLIC PANEL (CC13)
ALT: 31 U/L (ref 0–55)
ANION GAP: 11 meq/L (ref 3–11)
AST: 30 U/L (ref 5–34)
Albumin: 3.7 g/dL (ref 3.5–5.0)
Alkaline Phosphatase: 68 U/L (ref 40–150)
BILIRUBIN TOTAL: 0.4 mg/dL (ref 0.20–1.20)
BUN: 16.8 mg/dL (ref 7.0–26.0)
CALCIUM: 9 mg/dL (ref 8.4–10.4)
CO2: 26 meq/L (ref 22–29)
CREATININE: 0.9 mg/dL (ref 0.7–1.3)
Chloride: 105 mEq/L (ref 98–109)
EGFR: 86 mL/min/{1.73_m2} — ABNORMAL LOW (ref >90)
Glucose: 98 mg/dl (ref 70–140)
Potassium: 3.6 mEq/L (ref 3.5–5.1)
Sodium: 141 mEq/L (ref 136–145)
Total Protein: 6.1 g/dL — ABNORMAL LOW (ref 6.4–8.3)

## 2014-08-14 LAB — LACTATE DEHYDROGENASE (CC13): LDH: 206 U/L (ref 125–245)

## 2014-08-14 LAB — PHOSPHORUS: Phosphorus: 3 mg/dL (ref 2.5–4.6)

## 2014-08-14 LAB — MAGNESIUM (CC13): MAGNESIUM: 2.5 mg/dL (ref 1.5–2.5)

## 2014-08-14 MED ORDER — TARCEVA 100 MG PO TABS: 100.0000 mg | ORAL_TABLET | Freq: Every day | ORAL | Status: DC

## 2014-08-14 MED ORDER — ERLOTINIB HCL 100 MG PO TABS: 100.0000 mg | ORAL_TABLET | Freq: Every day | ORAL | Status: DC

## 2014-08-14 NOTE — Progress Notes (Signed)
[place] [phone]   [phone]  OFFICE PROGRESS NOTE  [name],[name], MD 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 Roxan Hockey on 01/23/2006.  2) Tarceva 150 mg by mouth daily as part of the BMS checkmate 370 clinical trial. Status post 6 weeks of treatment.   CURRENT THERAPY: Tarceva 100 mg by mouth daily as part of the BMS checkmate 370 clinical trial. Status post 4 weeks of treatment.  DISEASE STAGE: Recurrent  CHEMOTHERAPY INTENT: Palliative.  CURRENT # OF CHEMOTHERAPY CYCLES: 3  CURRENT ANTIEMETICS: None  CURRENT SMOKING STATUS: Non-smoker  ORAL CHEMOTHERAPY AND CONSENT: None  CURRENT BISPHOSPHONATES USE: None  LIVING WILL AND CODE STATUS: Full code.  INTERVAL HISTORY: DEQUON SCHNEBLY 69 y.o. male returns to the clinic today for followup visit accompanied by his wife. His dose of Tarceva was reduced to 100 mg by mouth daily for the last 4 weeks. He is tolerating this dose much better but continues to have grade 1 skin rash mainly on the face. He continues to apply the clindamycin lotion to the skin rash on the face area. He is feeling fine today with no other complaints. He denied having any significant chest pain, shortness of breath, cough or hemoptysis. The patient denied having any fever or chills, no nausea or vomiting and no diarrhea. He denied having any significant weight loss or night sweats. He had repeat CT scan of the chest, abdomen and pelvis performed recently and he is here for evaluation and discussion of his scan results.  MEDICAL HISTORY: Past Medical History  Diagnosis Date  .  Hypertension   . Hypercholesterolemia   . Sinus disease     hx of  . History of pneumonia  last 3-4 yrs ago    3 -4 different times  . Hypothyroidism   . Sleep apnea     uses cpap setting opf 12  . History of agent Orange exposure 44-45 yrs ago  . Complication of anesthesia 2007    quit breathing with bronchoscopy, had to spend night  . lung ca dx'd 2007    lung right    ALLERGIES:  is allergic to lisinopril.  MEDICATIONS:  Current Outpatient Prescriptions  Medication Sig Dispense Refill  . amitriptyline (ELAVIL) 50 MG tablet Take 50 mg by mouth at bedtime.    [name] aspirin 81 MG tablet Take 81 mg by mouth every morning.     . cholecalciferol (VITAMIN D) 1000 UNITS tablet Take 1,000 Units by mouth daily.    . clindamycin (CLEOCIN-T) 1 % external solution Apply topically 2 (two) times daily. 240 mL 1  . diltiazem (CARDIZEM CD) 360 MG 24 hr capsule Take 360 mg by mouth every morning.     [name] doxycycline (VIBRAMYCIN) 100 MG capsule Take 1 capsule (100 mg total) by mouth 2 (two) times daily. 60 capsule 1  . levETIRAcetam (KEPPRA) 500 MG tablet Take 750 mg by mouth daily.     [name] levETIRAcetam (KEPPRA) 750 MG tablet     . Levothyroxine Sodium 50 MCG CAPS Take 75 mcg by mouth daily before breakfast.     . metoCLOPramide (REGLAN) 10 MG tablet Take 10  mg by mouth every 6 (six) hours as needed for nausea.     . metoprolol succinate (TOPROL-XL) 25 MG 24 hr tablet Take 25 mg by mouth every morning.     . Multiple Vitamins-Minerals (CENTRUM SILVER PO) Take 1 tablet by mouth daily.     . naproxen sodium (ANAPROX) 550 MG tablet Take 550 mg by mouth 3 (three) times daily as needed for moderate pain.     . nitroGLYCERIN (NITROSTAT) 0.4 MG SL tablet Place 0.4 mg under the tongue every 5 (five) minutes as needed for chest pain.     . polyethylene glycol (MIRALAX / GLYCOLAX) packet Take 17 g by mouth daily.    . psyllium (METAMUCIL) 58.6 % powder Take 1 packet by mouth 3 (three) times daily.    .  rosuvastatin (CRESTOR) 5 MG tablet Take 5 mg by mouth every morning.     [name] TARCEVA 100 MG tablet     . valsartan-hydrochlorothiazide (DIOVAN-HCT) 320-25 MG per tablet Take 1 tablet by mouth every morning.      No current facility-administered medications for this visit.    SURGICAL HISTORY:  Past Surgical History  Procedure Laterality Date  . Cystourethroscopy, gyrus turp.  04/16/2010  . Torn medial and lateral menisci, left knee.  11/06/2006  . Right video-assisted thoracoscopy, right upper lobectomy and  01/23/2006  . The olympus video bronchoscope was introduced via the right  12/21/2005  . Nasal sinus surgery  1987  . Tonsillectomy  1957  . Colonoscopy with propofol N/A 03/19/2013    Procedure: COLONOSCOPY WITH PROPOFOL;  Surgeon: Garlan Fair, MD;  Location: [facility];  Service: Endoscopy;  Laterality: N/A;    REVIEW OF SYSTEMS:  Constitutional: negative Eyes: negative Ears, nose, mouth, throat, and face: negative Respiratory: negative Cardiovascular: negative Gastrointestinal: negative Genitourinary:negative Integument/breast: positive for dryness and rash Hematologic/lymphatic: negative Musculoskeletal:negative Neurological: negative Behavioral/Psych: negative Endocrine: negative Allergic/Immunologic: negative   PHYSICAL EXAMINATION: General appearance: alert, cooperative and no distress Head: Normocephalic, without obvious abnormality, atraumatic Neck: no adenopathy Lymph nodes: Cervical, supraclavicular, and axillary nodes normal. Resp: clear to auscultation bilaterally Back: symmetric, no curvature. ROM normal. No CVA tenderness. Cardio: regular rate and rhythm, S1, S2 normal, no murmur, click, rub or gallop GI: soft, non-tender; bowel sounds normal; no masses,  no organomegaly Extremities: extremities normal, atraumatic, no cyanosis or edema Neurologic: Alert and oriented X 3, normal strength and tone. Normal symmetric reflexes. Normal coordination and  gait  ECOG PERFORMANCE STATUS: 1 - Symptomatic but completely ambulatory  Blood pressure 132/75, pulse 65, temperature 97.7 F (36.5 C), temperature source Oral, resp. rate 18, height 6' (1.829 m), weight 244 lb 4.8 oz (110.814 kg), SpO2 100 %.  LABORATORY DATA: Lab Results  Component Value Date   WBC 5.0 08/14/2014   HGB 13.0 08/14/2014   HCT 37.9* 08/14/2014   MCV 98.3* 08/14/2014   PLT 229 08/14/2014      Chemistry      Component Value Date/Time   NA 141 07/31/2014 0820   NA 138 07/17/2014 1000   NA 139 10/20/2011 0903   K 3.7 07/31/2014 0820   K 3.3* 07/17/2014 1000   K 3.4 10/20/2011 0903   CL 107 07/17/2014 1000   CL 102 04/24/2012 0853   CL 101 10/20/2011 0903   CO2 24 07/31/2014 0820   CO2 26 07/17/2014 1000   CO2 29 10/20/2011 0903   BUN 14.3 07/31/2014 0820   BUN 16 07/17/2014 1000   BUN 12 10/20/2011 0903  CREATININE 0.9 07/31/2014 0820   CREATININE 0.82 07/17/2014 1000   CREATININE 0.9 10/20/2011 0903      Component Value Date/Time   CALCIUM 9.0 07/31/2014 0820   CALCIUM 8.9 07/17/2014 1000   CALCIUM 9.0 10/20/2011 0903   ALKPHOS 57 07/31/2014 0820   ALKPHOS 53 07/17/2014 1000   ALKPHOS 52 10/20/2011 0903   AST 24 07/31/2014 0820   AST 24 07/17/2014 1000   AST 24 10/20/2011 0903   ALT 33 07/31/2014 0820   ALT 28 07/17/2014 1000   ALT 31 10/20/2011 0903   BILITOT 0.45 07/31/2014 0820   BILITOT 0.8 07/17/2014 1000   BILITOT 0.60 10/20/2011 8675       RADIOGRAPHIC STUDIES: Ct Chest W Contrast  08/05/2014   CLINICAL DATA:  69 year old male with history of lung cancer diagnosed in 2007 status post right upper lobectomy. RECIST protocol  EXAM: CT CHEST AND ABDOMEN WITH CONTRAST  TECHNIQUE: Multidetector CT imaging of the chest and abdomen was performed following the standard protocol during bolus administration of intravenous contrast.  CONTRAST:  130m OMNIPAQUE IOHEXOL 300 MG/ML  SOLN  COMPARISON:  CT of the chest, abdomen and pelvis 06/03/2014.   FINDINGS: RECIST 1.1  Target Lesions:  1. Mixed ground-glass attenuation and solid lesion in the right lower lobe (image 23 of series 4) measuring up to 20 mm in greatest length (decreased from 23 mm on prior study). 2. Solid right lower lobe lesion (image 26 of series 4), currently measuring up to 13 mm in greatest length (decreased in size from 19 mm on the prior examination). Non-target Lesions:  1. Ground-glass attenuation nodule in the right lower lobe (image 28 of series 4), decreased in size compared to the prior examination, currently measuring 8 mm in length (previously up to 12 mm in length).  CT CHEST FINDINGS  Mediastinum/Lymph Nodes: Heart size is normal. There is no significant pericardial fluid, thickening or pericardial calcification. No pathologically enlarged mediastinal, hilar or supraclavicular lymph nodes. Esophagus is unremarkable in appearance. No axillary lymphadenopathy.  Lungs/Pleura: Status post right upper lobectomy. Compensatory hyperexpansion of the right middle and lower lobes. Previously noted target lesion in the right lower lobe (image 23 of series 4), slightly decreased in size compared to the prior examination, currently measuring 20 x 13 mm (previously 23 x 19 mm). In addition, the previously noted solid target lesion in the right lower lobe has decreased in size, currently measuring 6 x 13 mm (image 26 of series 4), previously 19 x 8 mm. Previously noted non target ground-glass attenuation nodule in the right lower lobe (image 28 of series 4) has also decreased in size, currently measuring only 8 x 5 mm (previously 12 x 7 mm). Nodular area of postoperative scarring in the medial aspect of the right lower lobe is slightly less prominent than prior examinations (image 25 of series 4), again favored to represent an area of mild postoperative scarring. No other new suspicious appearing pulmonary nodules or masses. No acute consolidative airspace disease. No pleural effusions.   Musculoskeletal/Soft Tissues: There are no aggressive appearing lytic or blastic lesions noted in the visualized portions of the skeleton.  CT ABDOMEN FINDINGS  Hepatobiliary: 2.9 cm rim calcified gallstone in the gallbladder. No current findings to suggest an acute cholecystitis at this time. No cystic or solid hepatic lesions. No intra or extrahepatic biliary ductal dilatation.  Pancreas: Unremarkable.  Spleen: Unremarkable.  Adrenals/Urinary Tract: Bilateral kidneys and bilateral adrenal glands are normal in appearance. No hydroureteronephrosis in the  visualized portions of the abdomen. Mild bilateral perinephric stranding is nonspecific, and similar to the prior examination.  Stomach/Bowel: Normal appearance of the stomach. No pathologic dilatation of the visualized portions of small bowel or colon.  Vascular/Lymphatic: Mild atherosclerotic disease throughout the visualized abdominal vasculature, without evidence of aneurysm or dissection. No lymphadenopathy noted in the visualized portions of the abdomen.  Other: No significant volume of ascites and no pneumoperitoneum noted in the visualized peritoneal cavity.  Musculoskeletal: There are no aggressive appearing lytic or blastic lesions noted in the visualized portions of the skeleton.  IMPRESSION: 1. Today's study demonstrates a positive response to therapy, with regression of target and non target RECIST lesions in the right lower lobe, as detailed above. 2. No new lesions are identified. 3. No findings to suggest metastatic disease to the abdomen. 4. Cholelithiasis without evidence of acute cholecystitis at this time. 5. Additional incidental findings, as above.   Electronically Signed   By: Vinnie Langton M.D.   On: 08/05/2014 09:57   Ct Abdomen W Contrast  08/05/2014   CLINICAL DATA:  69 year old male with history of lung cancer diagnosed in 2007 status post right upper lobectomy. RECIST protocol  EXAM: CT CHEST AND ABDOMEN WITH CONTRAST  TECHNIQUE:  Multidetector CT imaging of the chest and abdomen was performed following the standard protocol during bolus administration of intravenous contrast.  CONTRAST:  178m OMNIPAQUE IOHEXOL 300 MG/ML  SOLN  COMPARISON:  CT of the chest, abdomen and pelvis 06/03/2014.  FINDINGS: RECIST 1.1  Target Lesions:  1. Mixed ground-glass attenuation and solid lesion in the right lower lobe (image 23 of series 4) measuring up to 20 mm in greatest length (decreased from 23 mm on prior study). 2. Solid right lower lobe lesion (image 26 of series 4), currently measuring up to 13 mm in greatest length (decreased in size from 19 mm on the prior examination). Non-target Lesions:  1. Ground-glass attenuation nodule in the right lower lobe (image 28 of series 4), decreased in size compared to the prior examination, currently measuring 8 mm in length (previously up to 12 mm in length).  CT CHEST FINDINGS  Mediastinum/Lymph Nodes: Heart size is normal. There is no significant pericardial fluid, thickening or pericardial calcification. No pathologically enlarged mediastinal, hilar or supraclavicular lymph nodes. Esophagus is unremarkable in appearance. No axillary lymphadenopathy.  Lungs/Pleura: Status post right upper lobectomy. Compensatory hyperexpansion of the right middle and lower lobes. Previously noted target lesion in the right lower lobe (image 23 of series 4), slightly decreased in size compared to the prior examination, currently measuring 20 x 13 mm (previously 23 x 19 mm). In addition, the previously noted solid target lesion in the right lower lobe has decreased in size, currently measuring 6 x 13 mm (image 26 of series 4), previously 19 x 8 mm. Previously noted non target ground-glass attenuation nodule in the right lower lobe (image 28 of series 4) has also decreased in size, currently measuring only 8 x 5 mm (previously 12 x 7 mm). Nodular area of postoperative scarring in the medial aspect of the right lower lobe is  slightly less prominent than prior examinations (image 25 of series 4), again favored to represent an area of mild postoperative scarring. No other new suspicious appearing pulmonary nodules or masses. No acute consolidative airspace disease. No pleural effusions.  Musculoskeletal/Soft Tissues: There are no aggressive appearing lytic or blastic lesions noted in the visualized portions of the skeleton.  CT ABDOMEN FINDINGS  Hepatobiliary: 2.9 cm  rim calcified gallstone in the gallbladder. No current findings to suggest an acute cholecystitis at this time. No cystic or solid hepatic lesions. No intra or extrahepatic biliary ductal dilatation.  Pancreas: Unremarkable.  Spleen: Unremarkable.  Adrenals/Urinary Tract: Bilateral kidneys and bilateral adrenal glands are normal in appearance. No hydroureteronephrosis in the visualized portions of the abdomen. Mild bilateral perinephric stranding is nonspecific, and similar to the prior examination.  Stomach/Bowel: Normal appearance of the stomach. No pathologic dilatation of the visualized portions of small bowel or colon.  Vascular/Lymphatic: Mild atherosclerotic disease throughout the visualized abdominal vasculature, without evidence of aneurysm or dissection. No lymphadenopathy noted in the visualized portions of the abdomen.  Other: No significant volume of ascites and no pneumoperitoneum noted in the visualized peritoneal cavity.  Musculoskeletal: There are no aggressive appearing lytic or blastic lesions noted in the visualized portions of the skeleton.  IMPRESSION: 1. Today's study demonstrates a positive response to therapy, with regression of target and non target RECIST lesions in the right lower lobe, as detailed above. 2. No new lesions are identified. 3. No findings to suggest metastatic disease to the abdomen. 4. Cholelithiasis without evidence of acute cholecystitis at this time. 5. Additional incidental findings, as above.   Electronically Signed   By:  Vinnie Langton M.D.   On: 08/05/2014 09:57   ASSESSMENT AND PLAN: This is a very pleasant 69 years old white male with recurrent non-small cell lung cancer, adenocarcinoma with positive EGFR mutation in exon 21 that was initially diagnosed as stage IA non-small cell lung cancer status post right upper lobectomy and has been observation since October of 2007. He is status post 6 weeks' treatment with Tarceva 150 mg by mouth daily according to the BMS checkmate 370 clinical trial. The dose of Tarceva was reduced to 100 mg by mouth daily for the last 4 weeks and the patient is tolerating it much better. The recent CT scan of the chest, abdomen and pelvis showed positive response to the therapy with regression of the target and nontarget lesions. I discussed the scan results with the patient and his wife. I recommended for the patient to continue his current treatment with Tarceva 100 mg by mouth daily. He would come back for follow-up visit in 2 weeks for reevaluation and management of any adverse effect of his treatment. The patient was advised to call immediately if he has any other concerning symptoms in the interval.  All questions were answered. The patient knows to call the clinic with any problems, questions or concerns. We can certainly see the patient much sooner if necessary.  Disclaimer: This note was dictated with voice recognition software. Similar sounding words can inadvertently be transcribed and may not be corrected upon review.

## 2014-08-14 NOTE — Telephone Encounter (Signed)
per pof to sch pt appt-gave tp copy of sch

## 2014-08-14 NOTE — Progress Notes (Signed)
BMS [id number] - questionnaires (PROs) - patient into the cancer center for routine visit.  Patient was given PROs upon arrival to the cancer center.  The patient completed his PROs (EQ-5D-3L first and then the FACT-L booklet) before any study procedures were performed.  I checked the PROs for completeness.  Research blood was drawn 07/17/14.  The patient was thanked for his continued support of this clinical trial. Barb Fadumo Heng 08/14/2014.08:37AM

## 2014-08-14 NOTE — Progress Notes (Signed)
08/14/2014 Patient in to clinic this morning accompanied by his wife. Upon arrival, patient completed the EQ-5D and FACT-L questionnaires prior to any study procedures or dosing. Vital signs and pulse oximetry were obtained at rest. Patient reports that he continues to experience rash, mild itching and mild scalp pain. He reports fatigue causing him to take more naps and to slow his pace of activities, but it does not prevent him from performing his usual activities. He denies diarrhea or any other changes. Based on lab results review and history and physical by Dr. [name], and no evidence for disease progression on week 9 scans, patient condition was acceptable for continued treatment. Patient states that he has one table remaining, and will pick up new prescription, sent to [facility] today by Dr. [name]. Notified patient by phone (left message on home answering machine, per his request) that chemistry results were within acceptable range and that he should continue treatment as prescribed. Patient states that he is due to go on a beach vacation from June 4-11. Told patient I would notify Marcellus Scott, in the event the treatment visit can be delayed to the following Monday, however, patient states to check with him before changing the 6/9 appointment, because he may opt to return from vacation early for that visit. Cindy S. Brigitte Pulse BSN, RN, [place] 08/14/2014 12:06 PM

## 2014-08-21 ENCOUNTER — Other Ambulatory Visit: Payer: Self-pay | Admitting: *Deleted

## 2014-08-21 DIAGNOSIS — C3431 Malignant neoplasm of lower lobe, right bronchus or lung: Secondary | ICD-10-CM

## 2014-08-22 ENCOUNTER — Telehealth: Payer: Self-pay | Admitting: *Deleted

## 2014-08-22 NOTE — Telephone Encounter (Signed)
Oncology Nurse Navigator Documentation  Oncology Nurse Navigator Flowsheets 08/22/2014  Navigator Encounter Type 3 month  Patient Visit Type Follow-up.  I called to follow up with patient.  I left vm message to call if needed.   Treatment Phase Other  Interventions None required  Time Spent with Patient 5

## 2014-08-28 ENCOUNTER — Encounter: Payer: Self-pay | Admitting: Internal Medicine

## 2014-08-28 ENCOUNTER — Encounter: Payer: Self-pay | Admitting: Physician Assistant

## 2014-08-28 ENCOUNTER — Ambulatory Visit (HOSPITAL_BASED_OUTPATIENT_CLINIC_OR_DEPARTMENT_OTHER): Payer: Medicare Other | Admitting: Physician Assistant

## 2014-08-28 ENCOUNTER — Other Ambulatory Visit (HOSPITAL_BASED_OUTPATIENT_CLINIC_OR_DEPARTMENT_OTHER): Payer: Medicare Other

## 2014-08-28 ENCOUNTER — Telehealth: Payer: Self-pay | Admitting: Physician Assistant

## 2014-08-28 ENCOUNTER — Other Ambulatory Visit (HOSPITAL_COMMUNITY)
Admission: RE | Admit: 2014-08-28 | Discharge: 2014-08-28 | Disposition: A | Discharge status: Home / Self Care | Payer: Medicare Other | Source: Ambulatory Visit | Attending: Internal Medicine | Admitting: Internal Medicine

## 2014-08-28 ENCOUNTER — Encounter: Payer: Self-pay | Admitting: *Deleted

## 2014-08-28 VITALS — BP 141/79 | HR 57 | Temp 97.7°F | Resp 17 | Ht 72.0 in | Wt 246.0 lb

## 2014-08-28 DIAGNOSIS — C3431 Malignant neoplasm of lower lobe, right bronchus or lung: Secondary | ICD-10-CM

## 2014-08-28 DIAGNOSIS — C3490 Malignant neoplasm of unspecified part of unspecified bronchus or lung: Secondary | ICD-10-CM | POA: Diagnosis not present

## 2014-08-28 DIAGNOSIS — Z006 Encounter for examination for normal comparison and control in clinical research program: Secondary | ICD-10-CM

## 2014-08-28 DIAGNOSIS — Z79899 Other long term (current) drug therapy: Secondary | ICD-10-CM

## 2014-08-28 LAB — COMPREHENSIVE METABOLIC PANEL (CC13)
ALK PHOS: 61 U/L (ref 40–150)
ALT: 26 U/L (ref 0–55)
AST: 26 U/L (ref 5–34)
Albumin: 3.8 g/dL (ref 3.5–5.0)
Anion Gap: 9 mEq/L (ref 3–11)
BUN: 14.5 mg/dL (ref 7.0–26.0)
CALCIUM: 8.9 mg/dL (ref 8.4–10.4)
CO2: 26 meq/L (ref 22–29)
Chloride: 105 mEq/L (ref 98–109)
Creatinine: 0.9 mg/dL (ref 0.7–1.3)
EGFR: 89 mL/min/{1.73_m2} — AB (ref >90)
Glucose: 102 mg/dl (ref 70–140)
Potassium: 3.7 mEq/L (ref 3.5–5.1)
Sodium: 140 mEq/L (ref 136–145)
Total Bilirubin: 0.41 mg/dL (ref 0.20–1.20)
Total Protein: 6.3 g/dL — ABNORMAL LOW (ref 6.4–8.3)

## 2014-08-28 LAB — CBC WITH DIFFERENTIAL/PLATELET
BASO%: 0.7 % (ref 0.0–2.0)
Basophils Absolute: 0 10*3/uL (ref 0.0–0.1)
EOS ABS: 0.3 10*3/uL (ref 0.0–0.5)
EOS%: 4.2 % (ref 0.0–7.0)
HEMATOCRIT: 38.1 % — AB (ref 38.4–49.9)
HEMOGLOBIN: 13.1 g/dL (ref 13.0–17.1)
LYMPH%: 34.9 % (ref 14.0–49.0)
MCH: 34 pg — ABNORMAL HIGH (ref 27.2–33.4)
MCHC: 34.4 g/dL (ref 32.0–36.0)
MCV: 99 fL — ABNORMAL HIGH (ref 79.3–98.0)
MONO#: 0.6 10*3/uL (ref 0.1–0.9)
MONO%: 10 % (ref 0.0–14.0)
NEUT%: 50.2 % (ref 39.0–75.0)
NEUTROS ABS: 3 10*3/uL (ref 1.5–6.5)
Platelets: 194 10*3/uL (ref 140–400)
RBC: 3.85 10*6/uL — ABNORMAL LOW (ref 4.20–5.82)
RDW: 12.9 % (ref 11.0–14.6)
WBC: 6 10*3/uL (ref 4.0–10.3)
lymph#: 2.1 10*3/uL (ref 0.9–3.3)

## 2014-08-28 LAB — PHOSPHORUS: Phosphorus: 3.1 mg/dL (ref 2.5–4.6)

## 2014-08-28 LAB — MAGNESIUM (CC13): Magnesium: 2.5 mg/dl (ref 1.5–2.5)

## 2014-08-28 LAB — RESEARCH LABS

## 2014-08-28 LAB — TSH CHCC: TSH: 3.839 m(IU)/L (ref 0.320–4.118)

## 2014-08-28 LAB — LACTATE DEHYDROGENASE (CC13): LDH: 189 U/L (ref 125–245)

## 2014-08-28 NOTE — Telephone Encounter (Signed)
per pof to sch pt appt-gave pt copy of ssch-gave pt contrast

## 2014-08-28 NOTE — Progress Notes (Signed)
08/28/2014  0855 BMS 370 cycle 7 Patient in to clinic this morning accompanied by his wife. Upon arrival, patient completed the EQ-5D and FACT-L questionnaires prior to any study procedures or dosing. Vital signs and pulse oximetry were obtained at rest. Patient reports that he continues to experience rash, mild itching and mild scalp pain. He reports fatigue and states it has not changed since the last visit on 08/14/14. He denies diarrhea, constipation or any other changes. Based on lab results review and history and physical by Awilda Metro, PA and Dr. [name] , the patient condition was acceptable for continued treatment. Patient states that he has not missed taking any study drug.  He continues to complete his medication calendars and a June calendar was given to him today.  Patient is without questions or complaints. Marcellus Scott, RN Clinical Research

## 2014-08-28 NOTE — Progress Notes (Signed)
BMS [id number] - questionnaires (PROs) - patient into the cancer center for routine visit. Patient was given PROs upon arrival to the cancer center. The patient completed his PROs (EQ-5D-3L first and then FACT-L) before any study procedures were performed. I checked the PROs for completeness.  Research blood was drawn. The patient was thanked for his continued support of this clinical trial. Barb Kwanza Cancelliere 08/28/2014.08:13AM

## 2014-08-29 ENCOUNTER — Encounter: Payer: Self-pay | Admitting: Internal Medicine

## 2014-09-01 NOTE — Progress Notes (Addendum)
[place] [phone]   Fax:([phone]) [phone]  OFFICE PROGRESS NOTE  [name],[name], MD 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 Roxan Hockey on 01/23/2006.  2) Tarceva 150 mg by mouth daily as part of the BMS checkmate 370 clinical trial. Status post 6 weeks of treatment.   CURRENT THERAPY: Tarceva 100 mg by mouth daily as part of the BMS checkmate 370 clinical trial. Status post 6 weeks of treatment.  DISEASE STAGE: Recurrent  CHEMOTHERAPY INTENT: Palliative.  CURRENT # OF CHEMOTHERAPY CYCLES: 4  CURRENT ANTIEMETICS: None  CURRENT SMOKING STATUS: Non-smoker  ORAL CHEMOTHERAPY AND CONSENT: None  CURRENT BISPHOSPHONATES USE: None  LIVING WILL AND CODE STATUS: Full code.  INTERVAL HISTORY: [name] 69 y.o. male returns to the clinic today for followup visit accompanied by his wife. His dose of Tarceva was reduced to 100 mg by mouth daily for the last 6 weeks. He is tolerating this dose much better but continues to have grade 1 skin rash mainly on the face. He continues to apply the clindamycin lotion to the skin rash on the face area. He is feeling fine today with no other complaints. He denied having any significant chest pain, shortness of breath, cough or hemoptysis. The patient denied having any fever or chills, no nausea or vomiting and no diarrhea. He denied having any significant weight loss or night sweats.   MEDICAL HISTORY: Past Medical History  Diagnosis Date  . Hypertension   . Hypercholesterolemia   . Sinus disease     hx of  . History of pneumonia  last 3-4 yrs ago    3 -4 different times  .  Hypothyroidism   . Sleep apnea     uses cpap setting opf 12  . History of agent Orange exposure 44-45 yrs ago  . Complication of anesthesia 2007    quit breathing with bronchoscopy, had to spend night  . lung ca dx'd 2007    lung right    ALLERGIES:  is allergic to lisinopril.  MEDICATIONS:  Current Outpatient Prescriptions  Medication Sig Dispense Refill  . amitriptyline (ELAVIL) 50 MG tablet Take 50 mg by mouth at bedtime.    [name] aspirin 81 MG tablet Take 81 mg by mouth every morning.     . cholecalciferol (VITAMIN D) 1000 UNITS tablet Take 1,000 Units by mouth daily.    . clindamycin (CLEOCIN-T) 1 % external solution Apply topically 2 (two) times daily. 240 mL 1  . diltiazem (CARDIZEM CD) 360 MG 24 hr capsule Take 360 mg by mouth every morning.     . erlotinib (TARCEVA) 100 MG tablet Take 1 tablet (100 mg total) by mouth daily. Take on an empty stomach 1 hour before meals or 2 hours after.    . levETIRAcetam (KEPPRA) 750 MG tablet     . Levothyroxine Sodium 50 MCG CAPS Take 75 mcg by mouth daily before breakfast.     . metoCLOPramide (REGLAN) 10 MG tablet Take 10 mg by mouth every 6 (six) hours as needed for nausea.     . metoprolol succinate (TOPROL-XL) 25 MG 24 hr tablet Take 25 mg by mouth every morning.     [name]  Multiple Vitamins-Minerals (CENTRUM SILVER PO) Take 1 tablet by mouth daily.     . naproxen sodium (ANAPROX) 550 MG tablet Take 550 mg by mouth 3 (three) times daily as needed for moderate pain.     . nitroGLYCERIN (NITROSTAT) 0.4 MG SL tablet Place 0.4 mg under the tongue every 5 (five) minutes as needed for chest pain.     . polyethylene glycol (MIRALAX / GLYCOLAX) packet Take 17 g by mouth daily.    . psyllium (METAMUCIL) 58.6 % powder Take 1 packet by mouth 3 (three) times daily.    . rosuvastatin (CRESTOR) 5 MG tablet Take 5 mg by mouth every morning.     . valsartan-hydrochlorothiazide (DIOVAN-HCT) 320-25 MG per tablet Take 1 tablet by mouth every morning.      No  current facility-administered medications for this visit.    SURGICAL HISTORY:  Past Surgical History  Procedure Laterality Date  . Cystourethroscopy, gyrus turp.  04/16/2010  . Torn medial and lateral menisci, left knee.  11/06/2006  . Right video-assisted thoracoscopy, right upper lobectomy and  01/23/2006  . The olympus video bronchoscope was introduced via the right  12/21/2005  . Nasal sinus surgery  1987  . Tonsillectomy  1957  . Colonoscopy with propofol N/A 03/19/2013    Procedure: COLONOSCOPY WITH PROPOFOL;  Surgeon: Garlan Fair, MD;  Location: [facility];  Service: Endoscopy;  Laterality: N/A;    REVIEW OF SYSTEMS:  Constitutional: negative Eyes: negative Ears, nose, mouth, throat, and face: negative Respiratory: negative Cardiovascular: negative Gastrointestinal: negative Genitourinary:negative Integument/breast: positive for dryness and rash Hematologic/lymphatic: negative Musculoskeletal:negative Neurological: negative Behavioral/Psych: negative Endocrine: negative Allergic/Immunologic: negative   PHYSICAL EXAMINATION: General appearance: alert, cooperative and no distress Head: Normocephalic, without obvious abnormality, atraumatic Neck: no adenopathy Lymph nodes: Cervical, supraclavicular, and axillary nodes normal. Resp: clear to auscultation bilaterally Back: symmetric, no curvature. ROM normal. No CVA tenderness. Cardio: regular rate and rhythm, S1, S2 normal, no murmur, click, rub or gallop GI: soft, non-tender; bowel sounds normal; no masses,  no organomegaly Extremities: extremities normal, atraumatic, no cyanosis or edema Neurologic: Alert and oriented X 3, normal strength and tone. Normal symmetric reflexes. Normal coordination and gait Skin: generally dry with grade 1 aneform rash on face  ECOG PERFORMANCE STATUS: 1 - Symptomatic but completely ambulatory  Blood pressure 141/79, pulse 57, temperature 97.7 F (36.5 C), temperature source  Oral, resp. rate 17, height 6' (1.829 m), weight 246 lb (111.585 kg), SpO2 99 %.  LABORATORY DATA: Lab Results  Component Value Date   WBC 6.0 08/28/2014   HGB 13.1 08/28/2014   HCT 38.1* 08/28/2014   MCV 99.0* 08/28/2014   PLT 194 08/28/2014      Chemistry      Component Value Date/Time   NA 140 08/28/2014 0809   NA 138 07/17/2014 1000   NA 139 10/20/2011 0903   K 3.7 08/28/2014 0809   K 3.3* 07/17/2014 1000   K 3.4 10/20/2011 0903   CL 107 07/17/2014 1000   CL 102 04/24/2012 0853   CL 101 10/20/2011 0903   CO2 26 08/28/2014 0809   CO2 26 07/17/2014 1000   CO2 29 10/20/2011 0903   BUN 14.5 08/28/2014 0809   BUN 16 07/17/2014 1000   BUN 12 10/20/2011 0903   CREATININE 0.9 08/28/2014 0809   CREATININE 0.82 07/17/2014 1000   CREATININE 0.9 10/20/2011 0903      Component Value Date/Time   CALCIUM 8.9 08/28/2014 0809   CALCIUM 8.9 07/17/2014  1000   CALCIUM 9.0 10/20/2011 0903   ALKPHOS 61 08/28/2014 0809   ALKPHOS 53 07/17/2014 1000   ALKPHOS 52 10/20/2011 0903   AST 26 08/28/2014 0809   AST 24 07/17/2014 1000   AST 24 10/20/2011 0903   ALT 26 08/28/2014 0809   ALT 28 07/17/2014 1000   ALT 31 10/20/2011 0903   BILITOT 0.41 08/28/2014 0809   BILITOT 0.8 07/17/2014 1000   BILITOT 0.60 10/20/2011 0903       RADIOGRAPHIC STUDIES: Ct Chest W Contrast  08/05/2014   CLINICAL DATA:  69 year old male with history of lung cancer diagnosed in 2007 status post right upper lobectomy. RECIST protocol  EXAM: CT CHEST AND ABDOMEN WITH CONTRAST  TECHNIQUE: Multidetector CT imaging of the chest and abdomen was performed following the standard protocol during bolus administration of intravenous contrast.  CONTRAST:  130m OMNIPAQUE IOHEXOL 300 MG/ML  SOLN  COMPARISON:  CT of the chest, abdomen and pelvis 06/03/2014.  FINDINGS: RECIST 1.1  Target Lesions:  1. Mixed ground-glass attenuation and solid lesion in the right lower lobe (image 23 of series 4) measuring up to 20 mm in greatest  length (decreased from 23 mm on prior study). 2. Solid right lower lobe lesion (image 26 of series 4), currently measuring up to 13 mm in greatest length (decreased in size from 19 mm on the prior examination). Non-target Lesions:  1. Ground-glass attenuation nodule in the right lower lobe (image 28 of series 4), decreased in size compared to the prior examination, currently measuring 8 mm in length (previously up to 12 mm in length).  CT CHEST FINDINGS  Mediastinum/Lymph Nodes: Heart size is normal. There is no significant pericardial fluid, thickening or pericardial calcification. No pathologically enlarged mediastinal, hilar or supraclavicular lymph nodes. Esophagus is unremarkable in appearance. No axillary lymphadenopathy.  Lungs/Pleura: Status post right upper lobectomy. Compensatory hyperexpansion of the right middle and lower lobes. Previously noted target lesion in the right lower lobe (image 23 of series 4), slightly decreased in size compared to the prior examination, currently measuring 20 x 13 mm (previously 23 x 19 mm). In addition, the previously noted solid target lesion in the right lower lobe has decreased in size, currently measuring 6 x 13 mm (image 26 of series 4), previously 19 x 8 mm. Previously noted non target ground-glass attenuation nodule in the right lower lobe (image 28 of series 4) has also decreased in size, currently measuring only 8 x 5 mm (previously 12 x 7 mm). Nodular area of postoperative scarring in the medial aspect of the right lower lobe is slightly less prominent than prior examinations (image 25 of series 4), again favored to represent an area of mild postoperative scarring. No other new suspicious appearing pulmonary nodules or masses. No acute consolidative airspace disease. No pleural effusions.  Musculoskeletal/Soft Tissues: There are no aggressive appearing lytic or blastic lesions noted in the visualized portions of the skeleton.  CT ABDOMEN FINDINGS  Hepatobiliary:  2.9 cm rim calcified gallstone in the gallbladder. No current findings to suggest an acute cholecystitis at this time. No cystic or solid hepatic lesions. No intra or extrahepatic biliary ductal dilatation.  Pancreas: Unremarkable.  Spleen: Unremarkable.  Adrenals/Urinary Tract: Bilateral kidneys and bilateral adrenal glands are normal in appearance. No hydroureteronephrosis in the visualized portions of the abdomen. Mild bilateral perinephric stranding is nonspecific, and similar to the prior examination.  Stomach/Bowel: Normal appearance of the stomach. No pathologic dilatation of the visualized portions of small bowel or  colon.  Vascular/Lymphatic: Mild atherosclerotic disease throughout the visualized abdominal vasculature, without evidence of aneurysm or dissection. No lymphadenopathy noted in the visualized portions of the abdomen.  Other: No significant volume of ascites and no pneumoperitoneum noted in the visualized peritoneal cavity.  Musculoskeletal: There are no aggressive appearing lytic or blastic lesions noted in the visualized portions of the skeleton.  IMPRESSION: 1. Today's study demonstrates a positive response to therapy, with regression of target and non target RECIST lesions in the right lower lobe, as detailed above. 2. No new lesions are identified. 3. No findings to suggest metastatic disease to the abdomen. 4. Cholelithiasis without evidence of acute cholecystitis at this time. 5. Additional incidental findings, as above.   Electronically Signed   By: Vinnie Langton M.D.   On: 08/05/2014 09:57   Ct Abdomen W Contrast  08/05/2014   CLINICAL DATA:  69 year old male with history of lung cancer diagnosed in 2007 status post right upper lobectomy. RECIST protocol  EXAM: CT CHEST AND ABDOMEN WITH CONTRAST  TECHNIQUE: Multidetector CT imaging of the chest and abdomen was performed following the standard protocol during bolus administration of intravenous contrast.  CONTRAST:  137m OMNIPAQUE  IOHEXOL 300 MG/ML  SOLN  COMPARISON:  CT of the chest, abdomen and pelvis 06/03/2014.  FINDINGS: RECIST 1.1  Target Lesions:  1. Mixed ground-glass attenuation and solid lesion in the right lower lobe (image 23 of series 4) measuring up to 20 mm in greatest length (decreased from 23 mm on prior study). 2. Solid right lower lobe lesion (image 26 of series 4), currently measuring up to 13 mm in greatest length (decreased in size from 19 mm on the prior examination). Non-target Lesions:  1. Ground-glass attenuation nodule in the right lower lobe (image 28 of series 4), decreased in size compared to the prior examination, currently measuring 8 mm in length (previously up to 12 mm in length).  CT CHEST FINDINGS  Mediastinum/Lymph Nodes: Heart size is normal. There is no significant pericardial fluid, thickening or pericardial calcification. No pathologically enlarged mediastinal, hilar or supraclavicular lymph nodes. Esophagus is unremarkable in appearance. No axillary lymphadenopathy.  Lungs/Pleura: Status post right upper lobectomy. Compensatory hyperexpansion of the right middle and lower lobes. Previously noted target lesion in the right lower lobe (image 23 of series 4), slightly decreased in size compared to the prior examination, currently measuring 20 x 13 mm (previously 23 x 19 mm). In addition, the previously noted solid target lesion in the right lower lobe has decreased in size, currently measuring 6 x 13 mm (image 26 of series 4), previously 19 x 8 mm. Previously noted non target ground-glass attenuation nodule in the right lower lobe (image 28 of series 4) has also decreased in size, currently measuring only 8 x 5 mm (previously 12 x 7 mm). Nodular area of postoperative scarring in the medial aspect of the right lower lobe is slightly less prominent than prior examinations (image 25 of series 4), again favored to represent an area of mild postoperative scarring. No other new suspicious appearing pulmonary  nodules or masses. No acute consolidative airspace disease. No pleural effusions.  Musculoskeletal/Soft Tissues: There are no aggressive appearing lytic or blastic lesions noted in the visualized portions of the skeleton.  CT ABDOMEN FINDINGS  Hepatobiliary: 2.9 cm rim calcified gallstone in the gallbladder. No current findings to suggest an acute cholecystitis at this time. No cystic or solid hepatic lesions. No intra or extrahepatic biliary ductal dilatation.  Pancreas: Unremarkable.  Spleen:  Unremarkable.  Adrenals/Urinary Tract: Bilateral kidneys and bilateral adrenal glands are normal in appearance. No hydroureteronephrosis in the visualized portions of the abdomen. Mild bilateral perinephric stranding is nonspecific, and similar to the prior examination.  Stomach/Bowel: Normal appearance of the stomach. No pathologic dilatation of the visualized portions of small bowel or colon.  Vascular/Lymphatic: Mild atherosclerotic disease throughout the visualized abdominal vasculature, without evidence of aneurysm or dissection. No lymphadenopathy noted in the visualized portions of the abdomen.  Other: No significant volume of ascites and no pneumoperitoneum noted in the visualized peritoneal cavity.  Musculoskeletal: There are no aggressive appearing lytic or blastic lesions noted in the visualized portions of the skeleton.  IMPRESSION: 1. Today's study demonstrates a positive response to therapy, with regression of target and non target RECIST lesions in the right lower lobe, as detailed above. 2. No new lesions are identified. 3. No findings to suggest metastatic disease to the abdomen. 4. Cholelithiasis without evidence of acute cholecystitis at this time. 5. Additional incidental findings, as above.   Electronically Signed   By: Vinnie Langton M.D.   On: 08/05/2014 09:57   ASSESSMENT AND PLAN: This is a very pleasant 69 years old white male with recurrent non-small cell lung cancer, adenocarcinoma with  positive EGFR mutation in exon 21 that was initially diagnosed as stage IA non-small cell lung cancer status post right upper lobectomy and has been observation since October of 2007. He is status post 6 weeks' treatment with Tarceva 150 mg by mouth daily according to the BMS checkmate 370 clinical trial. The dose of Tarceva was reduced to 100 mg by mouth daily for the last 6 weeks and the patient is tolerating it much better. The recent CT scan of the chest, abdomen and pelvis showed positive response to the therapy with regression of the target and nontarget lesions. The patient was discussed with and also seen by Dr. [name]. He will continue his current treatment with Tarceva 100 mg by mouth daily. He will return in 2 weeks for reevaluation and management of any adverse effect of his treatment. The patient was advised to call immediately if he has any other concerning symptoms in the interval.  All questions were answered. The patient knows to call the clinic with any problems, questions or concerns. We can certainly see the patient much sooner if necessary.  Carlton Adam PA-C  ADDENDUM: Hematology/Oncology Attending: I had a face to face encounter with the patient. I recommended his care plan. This is a very pleasant 69 years old white male with recurrent non-small cell lung cancer, adenocarcinoma with positive EGFR mutation currently undergoing treatment with Tarceva 100 mg by mouth daily, and to the BMS checkmate 370 clinical trial. The patient is tolerating his treatment fairly well with improvement in the skin rash after reducing the dose of Tarceva. He denied having any significant diarrhea. The recent CT scan of the chest, abdomen and pelvis showed positive response to the treatment with regression of the target and nontarget lesions. I discussed the scan results with the patient and his wife. I recommended for him to continue with his current treatment with Tarceva. The patient  would come back for follow-up visit in 2 weeks for reevaluation as recommended by the protocol. He was advised to call immediately if he has any concerning symptoms in the interval.  Disclaimer: This note was dictated with voice recognition software. Similar sounding words can inadvertently be transcribed and may be missed upon review. Eilleen Kempf., MD 09/02/2014

## 2014-09-01 NOTE — Patient Instructions (Signed)
Continue on Tarceva at the current dose Follow up in 2 weeks

## 2014-09-10 ENCOUNTER — Encounter: Payer: Self-pay | Admitting: *Deleted

## 2014-09-10 DIAGNOSIS — C3431 Malignant neoplasm of lower lobe, right bronchus or lung: Secondary | ICD-10-CM

## 2014-09-10 MED ORDER — ERLOTINIB HCL 100 MG PO TABS: 100.0000 mg | ORAL_TABLET | Freq: Every day | ORAL | Status: DC

## 2014-09-11 ENCOUNTER — Ambulatory Visit (HOSPITAL_BASED_OUTPATIENT_CLINIC_OR_DEPARTMENT_OTHER): Payer: Medicare Other | Admitting: Internal Medicine

## 2014-09-11 ENCOUNTER — Encounter: Payer: Self-pay | Admitting: Internal Medicine

## 2014-09-11 ENCOUNTER — Other Ambulatory Visit (HOSPITAL_BASED_OUTPATIENT_CLINIC_OR_DEPARTMENT_OTHER): Payer: Medicare Other

## 2014-09-11 ENCOUNTER — Other Ambulatory Visit (HOSPITAL_COMMUNITY)
Admission: AD | Admit: 2014-09-11 | Discharge: 2014-09-11 | Disposition: A | Discharge status: Home / Self Care | Payer: Medicare Other | Source: Ambulatory Visit | Attending: Internal Medicine | Admitting: Internal Medicine

## 2014-09-11 VITALS — BP 117/74 | HR 58 | Temp 97.5°F | Resp 18 | Ht 72.0 in | Wt 246.0 lb

## 2014-09-11 DIAGNOSIS — C3430 Malignant neoplasm of lower lobe, unspecified bronchus or lung: Secondary | ICD-10-CM | POA: Insufficient documentation

## 2014-09-11 DIAGNOSIS — Z006 Encounter for examination for normal comparison and control in clinical research program: Secondary | ICD-10-CM

## 2014-09-11 DIAGNOSIS — C3431 Malignant neoplasm of lower lobe, right bronchus or lung: Secondary | ICD-10-CM

## 2014-09-11 DIAGNOSIS — L270 Generalized skin eruption due to drugs and medicaments taken internally: Secondary | ICD-10-CM

## 2014-09-11 LAB — COMPREHENSIVE METABOLIC PANEL (CC13)
ALK PHOS: 53 U/L (ref 40–150)
ALT: 26 U/L (ref 0–55)
AST: 30 U/L (ref 5–34)
Albumin: 3.8 g/dL (ref 3.5–5.0)
Anion Gap: 7 mEq/L (ref 3–11)
BUN: 13.3 mg/dL (ref 7.0–26.0)
CALCIUM: 9 mg/dL (ref 8.4–10.4)
CO2: 27 mEq/L (ref 22–29)
Chloride: 106 mEq/L (ref 98–109)
Creatinine: 0.9 mg/dL (ref 0.7–1.3)
EGFR: 89 mL/min/{1.73_m2} — ABNORMAL LOW (ref >90)
Glucose: 98 mg/dl (ref 70–140)
Potassium: 3.5 mEq/L (ref 3.5–5.1)
Sodium: 139 mEq/L (ref 136–145)
TOTAL PROTEIN: 6.2 g/dL — AB (ref 6.4–8.3)
Total Bilirubin: 0.63 mg/dL (ref 0.20–1.20)

## 2014-09-11 LAB — CBC WITH DIFFERENTIAL/PLATELET
BASO%: 0.8 % (ref 0.0–2.0)
Basophils Absolute: 0 10*3/uL (ref 0.0–0.1)
EOS%: 4.1 % (ref 0.0–7.0)
Eosinophils Absolute: 0.2 10*3/uL (ref 0.0–0.5)
HEMATOCRIT: 38.4 % (ref 38.4–49.9)
HGB: 13.2 g/dL (ref 13.0–17.1)
LYMPH#: 1.9 10*3/uL (ref 0.9–3.3)
LYMPH%: 34.1 % (ref 14.0–49.0)
MCH: 33.7 pg — ABNORMAL HIGH (ref 27.2–33.4)
MCHC: 34.2 g/dL (ref 32.0–36.0)
MCV: 98.5 fL — ABNORMAL HIGH (ref 79.3–98.0)
MONO#: 0.6 10*3/uL (ref 0.1–0.9)
MONO%: 11.3 % (ref 0.0–14.0)
NEUT%: 49.7 % (ref 39.0–75.0)
NEUTROS ABS: 2.8 10*3/uL (ref 1.5–6.5)
PLATELETS: 192 10*3/uL (ref 140–400)
RBC: 3.9 10*6/uL — ABNORMAL LOW (ref 4.20–5.82)
RDW: 13.4 % (ref 11.0–14.6)
WBC: 5.6 10*3/uL (ref 4.0–10.3)

## 2014-09-11 LAB — MAGNESIUM (CC13): Magnesium: 2.2 mg/dl (ref 1.5–2.5)

## 2014-09-11 LAB — PHOSPHORUS: Phosphorus: 3.3 mg/dL (ref 2.5–4.6)

## 2014-09-11 LAB — LACTATE DEHYDROGENASE (CC13): LDH: 197 U/L (ref 125–245)

## 2014-09-11 MED ORDER — ERLOTINIB HCL 100 MG PO TABS: 100.0000 mg | ORAL_TABLET | Freq: Every day | ORAL | Status: DC

## 2014-09-11 NOTE — Progress Notes (Signed)
BMS [id number] - questionnaires (PROs) - patient into the cancer center for routine visit. Patient was given PROs upon arrival to the cancer center. The patient completed his PROs ([id number] first and then the FACT-L booklet) before any study procedures were performed. I checked the PROs for completeness.  Patient's research blood was drawn 08/28/2014.  The patient was thanked for his continued support of this clinical trial. Barb Karnisha Lefebre 09/11/2014.08:17AM

## 2014-09-11 NOTE — Progress Notes (Signed)
[place] [phone]   Fax:(336) [phone]  OFFICE PROGRESS NOTE  [name],[name], MD 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 Roxan Hockey on 01/23/2006.  2) Tarceva 150 mg by mouth daily as part of the BMS checkmate 370 clinical trial. Status post 6 weeks of treatment.   CURRENT THERAPY: Tarceva 100 mg by mouth daily as part of the BMS checkmate 370 clinical trial. Status post 2 months of treatment.  DISEASE STAGE: Recurrent  CHEMOTHERAPY INTENT: Palliative.  CURRENT # OF CHEMOTHERAPY CYCLES: 3  CURRENT ANTIEMETICS: None  CURRENT SMOKING STATUS: Non-smoker  ORAL CHEMOTHERAPY AND CONSENT: None  CURRENT BISPHOSPHONATES USE: None  LIVING WILL AND CODE STATUS: Full code.  INTERVAL HISTORY: [name] 69 y.o. male returns to the clinic today for followup visit accompanied by his wife. His dose of Tarceva was reduced to 100 mg by mouth daily for the last 8 weeks. He continues to tolerate it much better but has grade 1 skin rash mainly on the face. He continues to apply the clindamycin lotion to the skin rash on the face area. He is feeling fine today with no other complaints. He denied having any significant chest pain, shortness of breath, cough or hemoptysis. The patient denied having any fever or chills, no nausea or vomiting and no diarrhea. He denied having any significant weight loss or night sweats.   MEDICAL HISTORY: Past Medical History  Diagnosis Date  . Hypertension   . Hypercholesterolemia   . Sinus disease     hx of  . History of pneumonia  last 3-4 yrs ago    3 -4 different times  .  Hypothyroidism   . Sleep apnea     uses cpap setting opf 12  . History of agent Orange exposure 44-45 yrs ago  . Complication of anesthesia 2007    quit breathing with bronchoscopy, had to spend night  . lung ca dx'd 2007    lung right    ALLERGIES:  is allergic to lisinopril.  MEDICATIONS:  Current Outpatient Prescriptions  Medication Sig Dispense Refill  . amitriptyline (ELAVIL) 50 MG tablet Take 50 mg by mouth at bedtime.    [name] aspirin 81 MG tablet Take 81 mg by mouth every morning.     . cholecalciferol (VITAMIN D) 1000 UNITS tablet Take 1,000 Units by mouth daily.    . clindamycin (CLEOCIN-T) 1 % external solution Apply topically 2 (two) times daily. 240 mL 1  . diltiazem (CARDIZEM CD) 360 MG 24 hr capsule Take 360 mg by mouth every morning.     . erlotinib (TARCEVA) 100 MG tablet Take 1 tablet (100 mg total) by mouth daily. Take on an empty stomach 1 hour before meals or 2 hours after.    . levETIRAcetam (KEPPRA) 750 MG tablet     . levothyroxine (SYNTHROID, LEVOTHROID) 75 MCG tablet     . metoCLOPramide (REGLAN) 10 MG tablet Take 10 mg by mouth every 6 (six) hours as needed for nausea.     . metoprolol succinate (TOPROL-XL) 25 MG 24 hr tablet Take 25 mg by mouth every morning.     . Multiple Vitamins-Minerals (CENTRUM SILVER  PO) Take 1 tablet by mouth daily.     . naproxen sodium (ANAPROX) 550 MG tablet Take 550 mg by mouth 3 (three) times daily as needed for moderate pain.     . nitroGLYCERIN (NITROSTAT) 0.4 MG SL tablet Place 0.4 mg under the tongue every 5 (five) minutes as needed for chest pain.     . polyethylene glycol (MIRALAX / GLYCOLAX) packet Take 17 g by mouth daily.    . psyllium (METAMUCIL) 58.6 % powder Take 1 packet by mouth 3 (three) times daily.    . rosuvastatin (CRESTOR) 5 MG tablet Take 5 mg by mouth every morning.     . valsartan-hydrochlorothiazide (DIOVAN-HCT) 320-25 MG per tablet Take 1 tablet by mouth every morning.      No current facility-administered  medications for this visit.    SURGICAL HISTORY:  Past Surgical History  Procedure Laterality Date  . Cystourethroscopy, gyrus turp.  04/16/2010  . Torn medial and lateral menisci, left knee.  11/06/2006  . Right video-assisted thoracoscopy, right upper lobectomy and  01/23/2006  . The olympus video bronchoscope was introduced via the right  12/21/2005  . Nasal sinus surgery  1987  . Tonsillectomy  1957  . Colonoscopy with propofol N/A 03/19/2013    Procedure: COLONOSCOPY WITH PROPOFOL;  Surgeon: Garlan Fair, MD;  Location: [facility];  Service: Endoscopy;  Laterality: N/A;    REVIEW OF SYSTEMS:  Constitutional: negative Eyes: negative Ears, nose, mouth, throat, and face: negative Respiratory: negative Cardiovascular: negative Gastrointestinal: negative Genitourinary:negative Integument/breast: positive for dryness and rash Hematologic/lymphatic: negative Musculoskeletal:negative Neurological: negative Behavioral/Psych: negative Endocrine: negative Allergic/Immunologic: negative   PHYSICAL EXAMINATION: General appearance: alert, cooperative and no distress Head: Normocephalic, without obvious abnormality, atraumatic Neck: no adenopathy Lymph nodes: Cervical, supraclavicular, and axillary nodes normal. Resp: clear to auscultation bilaterally Back: symmetric, no curvature. ROM normal. No CVA tenderness. Cardio: regular rate and rhythm, S1, S2 normal, no murmur, click, rub or gallop GI: soft, non-tender; bowel sounds normal; no masses,  no organomegaly Extremities: extremities normal, atraumatic, no cyanosis or edema Neurologic: Alert and oriented X 3, normal strength and tone. Normal symmetric reflexes. Normal coordination and gait  ECOG PERFORMANCE STATUS: 1 - Symptomatic but completely ambulatory  Blood pressure 117/74, pulse 58, temperature 97.5 F (36.4 C), temperature source Oral, resp. rate 18, height 6' (1.829 m), weight 246 lb (111.585 kg), SpO2 99  %.  LABORATORY DATA: Lab Results  Component Value Date   WBC 5.6 09/11/2014   HGB 13.2 09/11/2014   HCT 38.4 09/11/2014   MCV 98.5* 09/11/2014   PLT 192 09/11/2014      Chemistry      Component Value Date/Time   NA 140 08/28/2014 0809   NA 138 07/17/2014 1000   NA 139 10/20/2011 0903   K 3.7 08/28/2014 0809   K 3.3* 07/17/2014 1000   K 3.4 10/20/2011 0903   CL 107 07/17/2014 1000   CL 102 04/24/2012 0853   CL 101 10/20/2011 0903   CO2 26 08/28/2014 0809   CO2 26 07/17/2014 1000   CO2 29 10/20/2011 0903   BUN 14.5 08/28/2014 0809   BUN 16 07/17/2014 1000   BUN 12 10/20/2011 0903   CREATININE 0.9 08/28/2014 0809   CREATININE 0.82 07/17/2014 1000   CREATININE 0.9 10/20/2011 0903      Component Value Date/Time   CALCIUM 8.9 08/28/2014 0809   CALCIUM 8.9 07/17/2014 1000   CALCIUM 9.0 10/20/2011 0903   ALKPHOS 61 08/28/2014 0809  ALKPHOS 53 07/17/2014 1000   ALKPHOS 52 10/20/2011 0903   AST 26 08/28/2014 0809   AST 24 07/17/2014 1000   AST 24 10/20/2011 0903   ALT 26 08/28/2014 0809   ALT 28 07/17/2014 1000   ALT 31 10/20/2011 0903   BILITOT 0.41 08/28/2014 0809   BILITOT 0.8 07/17/2014 1000   BILITOT 0.60 10/20/2011 0903       RADIOGRAPHIC STUDIES: No results found. ASSESSMENT AND PLAN: This is a very pleasant 69 years old white male with recurrent non-small cell lung cancer, adenocarcinoma with positive EGFR mutation in exon 21 that was initially diagnosed as stage IA non-small cell lung cancer status post right upper lobectomy and has been observation since October of 2007. He is status post 6 weeks' treatment with Tarceva 150 mg by mouth daily according to the BMS checkmate 370 clinical trial. The dose of Tarceva was reduced to 100 mg by mouth daily for the last 8 weeks and the patient is tolerating it much better. I discussed the scan results with the patient and his wife. I recommended for the patient to continue his current treatment with Tarceva 100 mg  by mouth daily. For the skin rash, he will continue to apply clindamycin lotion and also advise him to use hydrocortisone cream as needed. He would come back for follow-up visit in 2 weeks for reevaluation and management of any adverse effect of his treatment. The patient was advised to call immediately if he has any other concerning symptoms in the interval.  All questions were answered. The patient knows to call the clinic with any problems, questions or concerns. We can certainly see the patient much sooner if necessary.  Disclaimer: This note was dictated with voice recognition software. Similar sounding words can inadvertently be transcribed and may not be corrected upon review.

## 2014-09-11 NOTE — Progress Notes (Signed)
09/11/2014 0850 BMS 370 Patient came in to clinic this morning accompanied by his wife. Upon arrival, the patient completed the EQ-5D and FACT-L questionnaires prior to any study procedures or dosing. Vital signs and pulse oximetry were obtained at rest. Patient reports that he continues to have a rash on his face, head, and chest which has not gotten any worse.  He complains of slight itching and mild scalp pain. He reports slight SOB and chest pressure only with exertion and states it goes away after exertion.  He still complains of fatigue but says he actually feels better this visit than the last visit. He denies diarrhea, constipation or any other changes. Based on lab results review and history and physical by Dr. [name], the patient's condition was acceptable for continued treatment. Patient states that he has not missed taking any study drug.  He continues to complete his medication calendars. Patient is without questions or complaints. Marcellus Scott, RN Clinical Research

## 2014-09-24 ENCOUNTER — Encounter: Payer: Self-pay | Admitting: *Deleted

## 2014-09-24 ENCOUNTER — Telehealth: Payer: Self-pay | Admitting: Internal Medicine

## 2014-09-24 NOTE — Telephone Encounter (Signed)
lvm for pt regardign to June appt and advised on added appts

## 2014-09-25 ENCOUNTER — Other Ambulatory Visit (HOSPITAL_BASED_OUTPATIENT_CLINIC_OR_DEPARTMENT_OTHER): Payer: Medicare Other

## 2014-09-25 ENCOUNTER — Other Ambulatory Visit (HOSPITAL_COMMUNITY)
Admission: RE | Admit: 2014-09-25 | Discharge: 2014-09-25 | Disposition: A | Discharge status: Home / Self Care | Payer: Medicare Other | Source: Ambulatory Visit | Attending: Internal Medicine | Admitting: Internal Medicine

## 2014-09-25 ENCOUNTER — Encounter: Payer: Self-pay | Admitting: Physician Assistant

## 2014-09-25 ENCOUNTER — Telehealth: Payer: Self-pay | Admitting: Physician Assistant

## 2014-09-25 ENCOUNTER — Ambulatory Visit (HOSPITAL_BASED_OUTPATIENT_CLINIC_OR_DEPARTMENT_OTHER): Payer: Medicare Other | Admitting: Physician Assistant

## 2014-09-25 ENCOUNTER — Encounter: Payer: Self-pay | Admitting: *Deleted

## 2014-09-25 VITALS — BP 115/79 | HR 70 | Temp 97.6°F | Resp 18 | Ht 72.0 in | Wt 242.9 lb

## 2014-09-25 DIAGNOSIS — C3431 Malignant neoplasm of lower lobe, right bronchus or lung: Secondary | ICD-10-CM

## 2014-09-25 DIAGNOSIS — Z006 Encounter for examination for normal comparison and control in clinical research program: Secondary | ICD-10-CM | POA: Diagnosis not present

## 2014-09-25 LAB — MAGNESIUM (CC13): Magnesium: 2.4 mg/dl (ref 1.5–2.5)

## 2014-09-25 LAB — COMPREHENSIVE METABOLIC PANEL (CC13)
ALK PHOS: 63 U/L (ref 40–150)
ALT: 26 U/L (ref 0–55)
AST: 25 U/L (ref 5–34)
Albumin: 4 g/dL (ref 3.5–5.0)
Anion Gap: 7 mEq/L (ref 3–11)
BILIRUBIN TOTAL: 0.42 mg/dL (ref 0.20–1.20)
BUN: 13.3 mg/dL (ref 7.0–26.0)
CO2: 29 mEq/L (ref 22–29)
Calcium: 9.3 mg/dL (ref 8.4–10.4)
Chloride: 106 mEq/L (ref 98–109)
Creatinine: 0.9 mg/dL (ref 0.7–1.3)
EGFR: 85 mL/min/{1.73_m2} — ABNORMAL LOW (ref >90)
GLUCOSE: 102 mg/dL (ref 70–140)
Potassium: 3.7 mEq/L (ref 3.5–5.1)
SODIUM: 141 meq/L (ref 136–145)
TOTAL PROTEIN: 6.6 g/dL (ref 6.4–8.3)

## 2014-09-25 LAB — CBC WITH DIFFERENTIAL/PLATELET
BASO%: 0.8 % (ref 0.0–2.0)
BASOS ABS: 0.1 10*3/uL (ref 0.0–0.1)
EOS%: 3.3 % (ref 0.0–7.0)
Eosinophils Absolute: 0.2 10*3/uL (ref 0.0–0.5)
HCT: 40.3 % (ref 38.4–49.9)
HGB: 13.9 g/dL (ref 13.0–17.1)
LYMPH#: 2.2 10*3/uL (ref 0.9–3.3)
LYMPH%: 35.2 % (ref 14.0–49.0)
MCH: 33.7 pg — AB (ref 27.2–33.4)
MCHC: 34.4 g/dL (ref 32.0–36.0)
MCV: 97.9 fL (ref 79.3–98.0)
MONO#: 0.6 10*3/uL (ref 0.1–0.9)
MONO%: 9.5 % (ref 0.0–14.0)
NEUT#: 3.2 10*3/uL (ref 1.5–6.5)
NEUT%: 51.2 % (ref 39.0–75.0)
Platelets: 224 10*3/uL (ref 140–400)
RBC: 4.12 10*6/uL — ABNORMAL LOW (ref 4.20–5.82)
RDW: 12.8 % (ref 11.0–14.6)
WBC: 6.3 10*3/uL (ref 4.0–10.3)

## 2014-09-25 LAB — PHOSPHORUS: Phosphorus: 2.8 mg/dL (ref 2.5–4.6)

## 2014-09-25 LAB — LACTATE DEHYDROGENASE (CC13): LDH: 192 U/L (ref 125–245)

## 2014-09-25 NOTE — Patient Instructions (Signed)
Continue Tarceva 100 mg by mouth daily Follow-up in 2 weeks

## 2014-09-25 NOTE — Progress Notes (Signed)
09/25/2014 0845 BMS 370 Patient came in to clinic this morning accompanied by his wife. Vital signs and pulse oximetry were obtained at rest. Patient reports that he continues to have a rash on his face, head, and chest.  He says it is worse on head and neck than the chest.  It is not on his back or legs.  He complains of slight itching and mild scalp pain. He reports SOB only with exertion. He complains of fatigue and says it has not gotten worse.  He denies diarrhea, constipation or any other changes. Based on lab results review and history and physical by Awilda Metro, PA and Dr. [name], the patient's condition was acceptable for continued treatment. Patient states that he has not missed taking any study drug. He continues to complete his medication calendars. Patient was given a patient alert clinical study participant card for the BMS 370 study that the research office received this week.  The patient verbalized understanding to keep in his wallet and to show to any healthcare provider he sees outside of Dr. [name].  Patient is without questions or complaints. Marcellus Scott, RN Clinical Research

## 2014-09-25 NOTE — Telephone Encounter (Signed)
per pof to sch pt appt-CX pt CT scan w/[facility]-gave avs

## 2014-09-25 NOTE — Progress Notes (Addendum)
[place] [phone]   [phone]) [phone]  OFFICE PROGRESS NOTE  [name],[name], MD 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 Roxan Hockey on 01/23/2006.  2) Tarceva 150 mg by mouth daily as part of the BMS checkmate 370 clinical trial. Status post 6 weeks of treatment.   CURRENT THERAPY: Tarceva 100 mg by mouth daily as part of the BMS checkmate 370 clinical trial. Status post 2.5 months of treatment.  DISEASE STAGE: Recurrent  CHEMOTHERAPY INTENT: Palliative.  CURRENT # OF CHEMOTHERAPY CYCLES: 2.5  CURRENT ANTIEMETICS: None  CURRENT SMOKING STATUS: Non-smoker  ORAL CHEMOTHERAPY AND CONSENT: None  CURRENT BISPHOSPHONATES USE: None  LIVING WILL AND CODE STATUS: Full code.  INTERVAL HISTORY: [name] 69 y.o. male returns to the clinic today for followup visit accompanied by his wife. His dose of Tarceva was reduced to 100 mg by mouth daily for the last 10 weeks. He continues to tolerate it much better except for grade 1 skin rash primarily affecting the face and scalp.  He continues to apply the clindamycin lotion to the skin rash twice daily as needed. He is feeling fine today with no other complaints. He denied having any significant chest pain, shortness of breath, cough or hemoptysis. The patient denied having any fever or chills, no nausea or vomiting and no diarrhea. He denied having any significant weight loss or night sweats.   MEDICAL HISTORY: Past Medical History  Diagnosis Date  . Hypertension   . Hypercholesterolemia   . Sinus disease     hx of  . History of pneumonia  last 3-4 yrs ago    3  -4 different times  . Hypothyroidism   . Sleep apnea     uses cpap setting opf 12  . History of agent Orange exposure 44-45 yrs ago  . Complication of anesthesia 2007    quit breathing with bronchoscopy, had to spend night  . lung ca dx'd 2007    lung right    ALLERGIES:  is allergic to lisinopril.  MEDICATIONS:  Current Outpatient Prescriptions  Medication Sig Dispense Refill  . amitriptyline (ELAVIL) 50 MG tablet Take 50 mg by mouth at bedtime.    [name] aspirin 81 MG tablet Take 81 mg by mouth every morning.     . cholecalciferol (VITAMIN D) 1000 UNITS tablet Take 1,000 Units by mouth daily.    . clindamycin (CLEOCIN-T) 1 % external solution Apply topically 2 (two) times daily. 240 mL 1  . diltiazem (CARDIZEM CD) 360 MG 24 hr capsule Take 360 mg by mouth every morning.     . erlotinib (TARCEVA) 100 MG tablet Take 1 tablet (100 mg total) by mouth daily. Take on an empty stomach 1 hour before meals or 2 hours after.    . levETIRAcetam (KEPPRA) 750 MG tablet     . levothyroxine (SYNTHROID, LEVOTHROID) 75 MCG tablet     . metoCLOPramide (REGLAN) 10 MG tablet Take 10 mg by mouth every 6 (six) hours as needed for nausea.     . metoprolol succinate (TOPROL-XL) 25 MG 24 hr tablet Take 25 mg by mouth every morning.     . Multiple  Vitamins-Minerals (CENTRUM SILVER PO) Take 1 tablet by mouth daily.     . polyethylene glycol (MIRALAX / GLYCOLAX) packet Take 17 g by mouth daily.    . psyllium (METAMUCIL) 58.6 % powder Take 1 packet by mouth 3 (three) times daily.    . rosuvastatin (CRESTOR) 5 MG tablet Take 5 mg by mouth every morning.     . valsartan-hydrochlorothiazide (DIOVAN-HCT) 320-25 MG per tablet Take 1 tablet by mouth every morning.     . naproxen sodium (ANAPROX) 550 MG tablet Take 550 mg by mouth 3 (three) times daily as needed for moderate pain.     . nitroGLYCERIN (NITROSTAT) 0.4 MG SL tablet Place 0.4 mg under the tongue every 5 (five) minutes as needed for chest pain.      No  current facility-administered medications for this visit.    SURGICAL HISTORY:  Past Surgical History  Procedure Laterality Date  . Cystourethroscopy, gyrus turp.  04/16/2010  . Torn medial and lateral menisci, left knee.  11/06/2006  . Right video-assisted thoracoscopy, right upper lobectomy and  01/23/2006  . The olympus video bronchoscope was introduced via the right  12/21/2005  . Nasal sinus surgery  1987  . Tonsillectomy  1957  . Colonoscopy with propofol N/A 03/19/2013    Procedure: COLONOSCOPY WITH PROPOFOL;  Surgeon: Garlan Fair, MD;  Location: [facility];  Service: Endoscopy;  Laterality: N/A;    REVIEW OF SYSTEMS:  Constitutional: negative Eyes: negative Ears, nose, mouth, throat, and face: negative Respiratory: negative Cardiovascular: negative Gastrointestinal: negative Genitourinary:negative Integument/breast: positive for dryness and rash Hematologic/lymphatic: negative Musculoskeletal:negative Neurological: negative Behavioral/Psych: negative Endocrine: negative Allergic/Immunologic: negative   PHYSICAL EXAMINATION: General appearance: alert, cooperative and no distress Head: Normocephalic, without obvious abnormality, atraumatic Neck: no adenopathy Lymph nodes: Cervical, supraclavicular, and axillary nodes normal. Resp: clear to auscultation bilaterally Back: symmetric, no curvature. ROM normal. No CVA tenderness. Cardio: regular rate and rhythm, S1, S2 normal, no murmur, click, rub or gallop GI: soft, non-tender; bowel sounds normal; no masses,  no organomegaly Extremities: extremities normal, atraumatic, no cyanosis or edema Neurologic: Alert and oriented X 3, normal strength and tone. Normal symmetric reflexes. Normal coordination and gait Skin: Grade 1 acneiform eruptions affecting the face and scalp. No evidence of superinfection  ECOG PERFORMANCE STATUS: 1 - Symptomatic but completely ambulatory  Blood pressure 115/79, pulse 70, temperature  97.6 F (36.4 C), temperature source Oral, resp. rate 18, height 6' (1.829 m), weight 242 lb 14.4 oz (110.179 kg), SpO2 97 %.  LABORATORY DATA: Lab Results  Component Value Date   WBC 6.3 09/25/2014   HGB 13.9 09/25/2014   HCT 40.3 09/25/2014   MCV 97.9 09/25/2014   PLT 224 09/25/2014      Chemistry      Component Value Date/Time   NA 141 09/25/2014 0806   NA 138 07/17/2014 1000   NA 139 10/20/2011 0903   K 3.7 09/25/2014 0806   K 3.3* 07/17/2014 1000   K 3.4 10/20/2011 0903   CL 107 07/17/2014 1000   CL 102 04/24/2012 0853   CL 101 10/20/2011 0903   CO2 29 09/25/2014 0806   CO2 26 07/17/2014 1000   CO2 29 10/20/2011 0903   BUN 13.3 09/25/2014 0806   BUN 16 07/17/2014 1000   BUN 12 10/20/2011 0903   CREATININE 0.9 09/25/2014 0806   CREATININE 0.82 07/17/2014 1000   CREATININE 0.9 10/20/2011 0903      Component Value Date/Time   CALCIUM 9.3 09/25/2014 0806  CALCIUM 8.9 07/17/2014 1000   CALCIUM 9.0 10/20/2011 0903   ALKPHOS 63 09/25/2014 0806   ALKPHOS 53 07/17/2014 1000   ALKPHOS 52 10/20/2011 0903   AST 25 09/25/2014 0806   AST 24 07/17/2014 1000   AST 24 10/20/2011 0903   ALT 26 09/25/2014 0806   ALT 28 07/17/2014 1000   ALT 31 10/20/2011 0903   BILITOT 0.42 09/25/2014 0806   BILITOT 0.8 07/17/2014 1000   BILITOT 0.60 10/20/2011 0903       RADIOGRAPHIC STUDIES: No results found. ASSESSMENT AND PLAN: This is a very pleasant 69 years old white male with recurrent non-small cell lung cancer, adenocarcinoma with positive EGFR mutation in exon 21 that was initially diagnosed as stage IA non-small cell lung cancer status post right upper lobectomy and has been observation since October of 2007. He is status post 6 weeks' treatment with Tarceva 150 mg by mouth daily according to the BMS checkmate 370 clinical trial. The dose of Tarceva was reduced to 100 mg by mouth daily. Now status post proximally 10 weeks at this dose and he is tolerating the lower dose much  better.  Patient was discussed with and also seen by Dr. [name]. He will continue on Tarceva at 100 mg by mouth daily. He is precipitating in the BMS checkmate 370 clinical trial and will be seen per protocol every 2 weeks. He will follow-up in 2 weeks for reevaluation. The patient was advised to call immediately if he has any other concerning symptoms in the interval.  All questions were answered. The patient knows to call the clinic with any problems, questions or concerns. We can certainly see the patient much sooner if necessary.  Carlton Adam, PA-C 09/25/2014   ADDENDUM: Hematology/Oncology Attending: I had a face to face encounter with the patient. I recommended his care plan. This is a very pleasant 69 years old white male with recurrent non-small cell lung cancer, adenocarcinoma was currently on treatment with Tarceva 100 mg by mouth daily. He is tolerating his treatment fairly well with no significant adverse effects except for mild skin rash. He denied having any significant diarrhea. I recommended for the patient to continue with his treatment with Tarceva as scheduled. He would come back for follow-up visit in 2 weeks for reevaluation. The patient was advised to call immediately if he has any concerning symptoms in the interval.  Disclaimer: This note was dictated with voice recognition software. Similar sounding words can inadvertently be transcribed and may be missed upon review. Eilleen Kempf., MD 09/27/2014

## 2014-09-29 ENCOUNTER — Other Ambulatory Visit: Payer: Self-pay

## 2014-09-30 ENCOUNTER — Encounter (HOSPITAL_COMMUNITY): Payer: Self-pay

## 2014-09-30 ENCOUNTER — Ambulatory Visit (HOSPITAL_COMMUNITY)
Admission: RE | Admit: 2014-09-30 | Discharge: 2014-09-30 | Disposition: A | Discharge status: Home / Self Care | Payer: Medicare Other | Source: Ambulatory Visit | Attending: Physician Assistant | Admitting: Physician Assistant

## 2014-09-30 DIAGNOSIS — Z79899 Other long term (current) drug therapy: Secondary | ICD-10-CM | POA: Insufficient documentation

## 2014-09-30 DIAGNOSIS — Z08 Encounter for follow-up examination after completed treatment for malignant neoplasm: Secondary | ICD-10-CM | POA: Insufficient documentation

## 2014-09-30 DIAGNOSIS — C3431 Malignant neoplasm of lower lobe, right bronchus or lung: Secondary | ICD-10-CM | POA: Diagnosis not present

## 2014-09-30 MED ORDER — IOHEXOL 300 MG/ML  SOLN
100.0000 mL | Freq: Once | INTRAMUSCULAR | Status: AC | PRN
  Administered 2014-09-30: 100 mL via INTRAVENOUS

## 2014-10-08 ENCOUNTER — Other Ambulatory Visit: Payer: Self-pay | Admitting: Internal Medicine

## 2014-10-08 ENCOUNTER — Encounter: Payer: Self-pay | Admitting: *Deleted

## 2014-10-08 DIAGNOSIS — C3431 Malignant neoplasm of lower lobe, right bronchus or lung: Secondary | ICD-10-CM

## 2014-10-09 ENCOUNTER — Ambulatory Visit (HOSPITAL_BASED_OUTPATIENT_CLINIC_OR_DEPARTMENT_OTHER): Payer: Medicare Other | Admitting: Internal Medicine

## 2014-10-09 ENCOUNTER — Encounter: Payer: Self-pay | Admitting: *Deleted

## 2014-10-09 ENCOUNTER — Encounter: Payer: Self-pay | Admitting: Internal Medicine

## 2014-10-09 ENCOUNTER — Other Ambulatory Visit (HOSPITAL_BASED_OUTPATIENT_CLINIC_OR_DEPARTMENT_OTHER): Payer: Medicare Other

## 2014-10-09 ENCOUNTER — Other Ambulatory Visit (HOSPITAL_COMMUNITY)
Admission: RE | Admit: 2014-10-09 | Discharge: 2014-10-09 | Disposition: A | Discharge status: Home / Self Care | Payer: Medicare Other | Source: Ambulatory Visit | Attending: Internal Medicine | Admitting: Internal Medicine

## 2014-10-09 ENCOUNTER — Other Ambulatory Visit: Payer: Self-pay | Admitting: *Deleted

## 2014-10-09 VITALS — BP 132/78 | HR 66 | Temp 98.0°F | Resp 18 | Ht 72.0 in | Wt 244.7 lb

## 2014-10-09 DIAGNOSIS — C3431 Malignant neoplasm of lower lobe, right bronchus or lung: Secondary | ICD-10-CM | POA: Insufficient documentation

## 2014-10-09 DIAGNOSIS — Z006 Encounter for examination for normal comparison and control in clinical research program: Secondary | ICD-10-CM

## 2014-10-09 DIAGNOSIS — Z79899 Other long term (current) drug therapy: Secondary | ICD-10-CM

## 2014-10-09 DIAGNOSIS — Z5111 Encounter for antineoplastic chemotherapy: Secondary | ICD-10-CM | POA: Insufficient documentation

## 2014-10-09 DIAGNOSIS — R946 Abnormal results of thyroid function studies: Secondary | ICD-10-CM

## 2014-10-09 DIAGNOSIS — L270 Generalized skin eruption due to drugs and medicaments taken internally: Secondary | ICD-10-CM

## 2014-10-09 LAB — MAGNESIUM (CC13): Magnesium: 2.2 mg/dl (ref 1.5–2.5)

## 2014-10-09 LAB — COMPREHENSIVE METABOLIC PANEL (CC13)
ALBUMIN: 3.9 g/dL (ref 3.5–5.0)
ALT: 29 U/L (ref 0–55)
ANION GAP: 9 meq/L (ref 3–11)
AST: 27 U/L (ref 5–34)
Alkaline Phosphatase: 61 U/L (ref 40–150)
BUN: 18.1 mg/dL (ref 7.0–26.0)
CO2: 25 meq/L (ref 22–29)
CREATININE: 1 mg/dL (ref 0.7–1.3)
Calcium: 9.4 mg/dL (ref 8.4–10.4)
Chloride: 102 mEq/L (ref 98–109)
EGFR: 77 mL/min/{1.73_m2} — ABNORMAL LOW (ref >90)
Glucose: 99 mg/dl (ref 70–140)
POTASSIUM: 3.4 meq/L — AB (ref 3.5–5.1)
Sodium: 137 mEq/L (ref 136–145)
Total Bilirubin: 0.59 mg/dL (ref 0.20–1.20)
Total Protein: 6.4 g/dL (ref 6.4–8.3)

## 2014-10-09 LAB — CBC WITH DIFFERENTIAL/PLATELET
BASO%: 0.8 % (ref 0.0–2.0)
BASOS ABS: 0 10*3/uL (ref 0.0–0.1)
EOS ABS: 0.2 10*3/uL (ref 0.0–0.5)
EOS%: 4 % (ref 0.0–7.0)
HCT: 40.3 % (ref 38.4–49.9)
HGB: 13.8 g/dL (ref 13.0–17.1)
LYMPH%: 42.2 % (ref 14.0–49.0)
MCH: 33.4 pg (ref 27.2–33.4)
MCHC: 34.3 g/dL (ref 32.0–36.0)
MCV: 97.4 fL (ref 79.3–98.0)
MONO#: 0.6 10*3/uL (ref 0.1–0.9)
MONO%: 10.7 % (ref 0.0–14.0)
NEUT%: 42.3 % (ref 39.0–75.0)
NEUTROS ABS: 2.5 10*3/uL (ref 1.5–6.5)
PLATELETS: 206 10*3/uL (ref 140–400)
RBC: 4.14 10*6/uL — AB (ref 4.20–5.82)
RDW: 13.1 % (ref 11.0–14.6)
WBC: 6 10*3/uL (ref 4.0–10.3)
lymph#: 2.5 10*3/uL (ref 0.9–3.3)

## 2014-10-09 LAB — LACTATE DEHYDROGENASE (CC13): LDH: 185 U/L (ref 125–245)

## 2014-10-09 LAB — T4, FREE: FREE T4: 1.09 ng/dL (ref 0.80–1.80)

## 2014-10-09 LAB — T3, FREE: T3, Free: 2.9 pg/mL (ref 2.3–4.2)

## 2014-10-09 LAB — TSH CHCC: TSH: 8.089 m[IU]/L — AB (ref 0.320–4.118)

## 2014-10-09 LAB — PHOSPHORUS: PHOSPHORUS: 2.8 mg/dL (ref 2.5–4.6)

## 2014-10-09 NOTE — Progress Notes (Signed)
10/09/2014 0915 BMS 370 Week 18  Patient in to clinic with his wife.  He completed the PROs prior to labs being drawn. Vital signs and pulse oximetry were obtained at rest. Patient reports that he continues to have a rash on his face, head, and chest. He says it is no worse or better than last visit.  The acne-like rash is on his head and neck. He complains of slight itching and mild scalp pain. He reports SOB only with exertion. He complains of fatigue and says it has not gotten worse. He denies diarrhea, constipation or any other changes since his last visit.  He reports that his condition is really unchanged. Patient states that he has not missed taking any study drug.  Based on lab results review and history and physical, and a review of recent CT scan results showing stable disease, Dr. [name] said the patient's condition was acceptable for continued treatment.  He has a low potassium level.  Per MD, treat by eating foods that are high in potassium.  Patient's TSH was elevated.  The patient is asymptomatic, A reflex T3 and T4 level was ordered per protocol. Per Dr. [name], "have patient notify his treating MD for his h/o hypothyroidism".  Patient currently taking Synthroid for his thyroid.  The patient was informed of the above and he had no questions.  He stated he would contact his physician, Dr. Inda Merlin, who manages his hypothyroidism.  He verbalized comprehension to eat potassium rich foods. The patient understands to call for problems or concerns. Marcellus Scott, RN Clinical Research

## 2014-10-09 NOTE — Progress Notes (Addendum)
[place] [phone]   [phone]  OFFICE PROGRESS NOTE  [name],[name], MD 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 Roxan Hockey on 01/23/2006.  2) Tarceva 150 mg by mouth daily as part of the BMS checkmate 370 clinical trial. Status post 6 weeks of treatment.   CURRENT THERAPY: Tarceva 100 mg by mouth daily as part of the BMS checkmate 370 clinical trial. Status post 3 months of treatment.  DISEASE STAGE: Recurrent  CHEMOTHERAPY INTENT: Palliative.  CURRENT # OF CHEMOTHERAPY CYCLES: 4  CURRENT ANTIEMETICS: None  CURRENT SMOKING STATUS: Non-smoker  ORAL CHEMOTHERAPY AND CONSENT: None  CURRENT BISPHOSPHONATES USE: None  LIVING WILL AND CODE STATUS: Full code.  INTERVAL HISTORY: [name] 68 y.o. male returns to the clinic today for followup visit accompanied by his wife. His dose of Tarceva was reduced to 100 mg by mouth daily for the last 12 weeks. He continues to tolerate it much better but has grade 1 skin rash mainly on the face. He continues to apply the clindamycin lotion to the skin rash on the face area. He is feeling fine today with no other complaints. He denied having any significant chest pain, shortness of breath, cough or hemoptysis. The patient denied having any fever or chills, no nausea or vomiting and no diarrhea. He denied having any significant weight loss or night sweats. He had repeat CT scan of the chest, abdomen and pelvis performed recently and he is here for evaluation and discussion of his scan results.  MEDICAL HISTORY: Past Medical History  Diagnosis Date  . Hypertension    . Hypercholesterolemia   . Sinus disease     hx of  . History of pneumonia  last 3-4 yrs ago    3 -4 different times  . Hypothyroidism   . Sleep apnea     uses cpap setting opf 12  . History of agent Orange exposure 44-45 yrs ago  . Complication of anesthesia 2007    quit breathing with bronchoscopy, had to spend night  . lung ca dx'd 2007    lung right    ALLERGIES:  is allergic to lisinopril.  MEDICATIONS:  Current Outpatient Prescriptions  Medication Sig Dispense Refill  . amitriptyline (ELAVIL) 50 MG tablet Take 50 mg by mouth at bedtime.    [name] aspirin 81 MG tablet Take 81 mg by mouth every morning.     . cholecalciferol (VITAMIN D) 1000 UNITS tablet Take 2,000 Units by mouth daily.     . clindamycin (CLEOCIN-T) 1 % external solution Apply topically 2 (two) times daily. 240 mL 1  . diltiazem (CARDIZEM CD) 360 MG 24 hr capsule Take 360 mg by mouth every morning.     . erlotinib (TARCEVA) 100 MG tablet Take 1 tablet (100 mg total) by mouth daily. Take on an empty stomach 1 hour before meals or 2 hours after.    . levETIRAcetam (KEPPRA) 750 MG tablet     . levothyroxine (SYNTHROID, LEVOTHROID) 75 MCG tablet     . metoCLOPramide (REGLAN) 10 MG tablet Take 10 mg by mouth every 6 (six) hours as needed for nausea.     [name]  metoprolol succinate (TOPROL-XL) 25 MG 24 hr tablet Take 25 mg by mouth every morning.     . Multiple Vitamins-Minerals (CENTRUM SILVER PO) Take 1 tablet by mouth daily.     . naproxen sodium (ANAPROX) 550 MG tablet Take 550 mg by mouth 3 (three) times daily as needed for moderate pain.     . nitroGLYCERIN (NITROSTAT) 0.4 MG SL tablet Place 0.4 mg under the tongue every 5 (five) minutes as needed for chest pain.     . polyethylene glycol (MIRALAX / GLYCOLAX) packet Take 17 g by mouth daily.    . psyllium (METAMUCIL) 58.6 % powder Take 1 packet by mouth 3 (three) times daily.    . rosuvastatin (CRESTOR) 5 MG tablet Take 5 mg by mouth every morning.     .  valsartan-hydrochlorothiazide (DIOVAN-HCT) 320-25 MG per tablet Take 1 tablet by mouth every morning.      No current facility-administered medications for this visit.    SURGICAL HISTORY:  Past Surgical History  Procedure Laterality Date  . Cystourethroscopy, gyrus turp.  04/16/2010  . Torn medial and lateral menisci, left knee.  11/06/2006  . Right video-assisted thoracoscopy, right upper lobectomy and  01/23/2006  . The olympus video bronchoscope was introduced via the right  12/21/2005  . Nasal sinus surgery  1987  . Tonsillectomy  1957  . Colonoscopy with propofol N/A 03/19/2013    Procedure: COLONOSCOPY WITH PROPOFOL;  Surgeon: Garlan Fair, MD;  Location: [facility];  Service: Endoscopy;  Laterality: N/A;    REVIEW OF SYSTEMS:  Constitutional: negative Eyes: negative Ears, nose, mouth, throat, and face: negative Respiratory: negative Cardiovascular: negative Gastrointestinal: negative Genitourinary:negative Integument/breast: positive for dryness and rash Hematologic/lymphatic: negative Musculoskeletal:negative Neurological: negative Behavioral/Psych: negative Endocrine: negative Allergic/Immunologic: negative   PHYSICAL EXAMINATION: General appearance: alert, cooperative and no distress Head: Normocephalic, without obvious abnormality, atraumatic Neck: no adenopathy Lymph nodes: Cervical, supraclavicular, and axillary nodes normal. Resp: clear to auscultation bilaterally Back: symmetric, no curvature. ROM normal. No CVA tenderness. Cardio: regular rate and rhythm, S1, S2 normal, no murmur, click, rub or gallop GI: soft, non-tender; bowel sounds normal; no masses,  no organomegaly Extremities: extremities normal, atraumatic, no cyanosis or edema Neurologic: Alert and oriented X 3, normal strength and tone. Normal symmetric reflexes. Normal coordination and gait  ECOG PERFORMANCE STATUS: 1 - Symptomatic but completely ambulatory  Blood pressure 132/78, pulse  66, temperature 98 F (36.7 C), temperature source Oral, resp. rate 18, height 6' (1.829 m), weight 244 lb 11.2 oz (110.995 kg), SpO2 97 %.  LABORATORY DATA: Lab Results  Component Value Date   WBC 6.0 10/09/2014   HGB 13.8 10/09/2014   HCT 40.3 10/09/2014   MCV 97.4 10/09/2014   PLT 206 10/09/2014      Chemistry      Component Value Date/Time   NA 141 09/25/2014 0806   NA 138 07/17/2014 1000   NA 139 10/20/2011 0903   K 3.7 09/25/2014 0806   K 3.3* 07/17/2014 1000   K 3.4 10/20/2011 0903   CL 107 07/17/2014 1000   CL 102 04/24/2012 0853   CL 101 10/20/2011 0903   CO2 29 09/25/2014 0806   CO2 26 07/17/2014 1000   CO2 29 10/20/2011 0903   BUN 13.3 09/25/2014 0806   BUN 16 07/17/2014 1000   BUN 12 10/20/2011 0903   CREATININE 0.9 09/25/2014 0806   CREATININE 0.82 07/17/2014 1000   CREATININE 0.9 10/20/2011 0903      Component Value  Date/Time   CALCIUM 9.3 09/25/2014 0806   CALCIUM 8.9 07/17/2014 1000   CALCIUM 9.0 10/20/2011 0903   ALKPHOS 63 09/25/2014 0806   ALKPHOS 53 07/17/2014 1000   ALKPHOS 52 10/20/2011 0903   AST 25 09/25/2014 0806   AST 24 07/17/2014 1000   AST 24 10/20/2011 0903   ALT 26 09/25/2014 0806   ALT 28 07/17/2014 1000   ALT 31 10/20/2011 0903   BILITOT 0.42 09/25/2014 0806   BILITOT 0.8 07/17/2014 1000   BILITOT 0.60 10/20/2011 0903       RADIOGRAPHIC STUDIES: Ct Chest W Contrast  10/01/2014   ADDENDUM REPORT: 10/01/2014 11:25  ADDENDUM: RECIST 1.1  Target Lesions:  1. Mixed solid and ground-glass nodule in the right lower lobe measures 20 mm on image 21 series 4 compared to 20 mm on prior.  2. Solid lesion in the right lower lobe measures 11 mm image 24, series 4 compared to 13 mm on prior. Non-target Lesions:  1. Small 8 mm ground-glass nodule image 26, series 4 unchanged.   Electronically Signed   By: Suzy Bouchard M.D.   On: 10/01/2014 11:25   10/01/2014   CLINICAL DATA:  Restaging lung cancer. Oral chemotherapy ongoing. Right lung  lobectomy.  EXAM: CT CHEST, ABDOMEN, WITH CONTRAST  TECHNIQUE: Multidetector CT imaging of the chest, abdomen and was performed following the standard protocol during bolus administration of intravenous contrast.  CONTRAST:  125m OMNIPAQUE IOHEXOL 300 MG/ML  SOLN  COMPARISON:  CT 08/05/2014  FINDINGS: CT CHEST FINDINGS  Mediastinum/Nodes: No axillary supraclavicular adenopathy. No mediastinal hilar adenopathy. No pericardial fluid. Surgical clips along the right mediastinal border.  Lungs/Pleura:  Mixed solid and ground-glass nodule in the right lower lobe measures 20 mm on image 21 series 4 compared to 20 mm on prior.  Solid lesion in the right lower lobe measures 11 mm image 24, series 4 compared to 13 mm on prior. Postsurgical change in the right hemi thorax consistent with upper lobectomy.  Small 8 mm ground-glass nodule image 26, series 4 unchanged.  No left pulmonary nodules.  CT ABDOMEN AND PELVIS FINDINGS  Hepatobiliary: No focal hepatic lesion. 27 mm gallstone noted on image 51, series 2.  Pancreas: Pancreas is normal. No ductal dilatation. No pancreatic inflammation.  Spleen: Normal spleen  Adrenals/urinary tract: Adrenal glands and kidneys are normal. The ureters and bladder normal.  Stomach/Bowel: Stomach and limited view of the bowel are unremarkable.  Vascular/Lymphatic: Abdominal aorta is normal caliber. No retroperitoneal periportal adenopathy.  Musculoskeletal: No aggressive osseous lesion.  IMPRESSION: 1. Stable right lower lobe pulmonary nodules.  No new nodularity. 2. No mediastinal lymphadenopathy. 3. No evidence of metastasis in the abdomen.  Electronically Signed: By: SSuzy BouchardM.D. On: 09/30/2014 10:49   Ct Abdomen W Contrast  10/01/2014   ADDENDUM REPORT: 10/01/2014 11:25  ADDENDUM: RECIST 1.1  Target Lesions:  1. Mixed solid and ground-glass nodule in the right lower lobe measures 20 mm on image 21 series 4 compared to 20 mm on prior.  2. Solid lesion in the right lower lobe  measures 11 mm image 24, series 4 compared to 13 mm on prior. Non-target Lesions:  1. Small 8 mm ground-glass nodule image 26, series 4 unchanged.   Electronically Signed   By: SSuzy BouchardM.D.   On: 10/01/2014 11:25   10/01/2014   CLINICAL DATA:  Restaging lung cancer. Oral chemotherapy ongoing. Right lung lobectomy.  EXAM: CT CHEST, ABDOMEN, WITH CONTRAST  TECHNIQUE: Multidetector CT  imaging of the chest, abdomen and was performed following the standard protocol during bolus administration of intravenous contrast.  CONTRAST:  193m OMNIPAQUE IOHEXOL 300 MG/ML  SOLN  COMPARISON:  CT 08/05/2014  FINDINGS: CT CHEST FINDINGS  Mediastinum/Nodes: No axillary supraclavicular adenopathy. No mediastinal hilar adenopathy. No pericardial fluid. Surgical clips along the right mediastinal border.  Lungs/Pleura:  Mixed solid and ground-glass nodule in the right lower lobe measures 20 mm on image 21 series 4 compared to 20 mm on prior.  Solid lesion in the right lower lobe measures 11 mm image 24, series 4 compared to 13 mm on prior. Postsurgical change in the right hemi thorax consistent with upper lobectomy.  Small 8 mm ground-glass nodule image 26, series 4 unchanged.  No left pulmonary nodules.  CT ABDOMEN AND PELVIS FINDINGS  Hepatobiliary: No focal hepatic lesion. 27 mm gallstone noted on image 51, series 2.  Pancreas: Pancreas is normal. No ductal dilatation. No pancreatic inflammation.  Spleen: Normal spleen  Adrenals/urinary tract: Adrenal glands and kidneys are normal. The ureters and bladder normal.  Stomach/Bowel: Stomach and limited view of the bowel are unremarkable.  Vascular/Lymphatic: Abdominal aorta is normal caliber. No retroperitoneal periportal adenopathy.  Musculoskeletal: No aggressive osseous lesion.  IMPRESSION: 1. Stable right lower lobe pulmonary nodules.  No new nodularity. 2. No mediastinal lymphadenopathy. 3. No evidence of metastasis in the abdomen.  Electronically Signed: By: SSuzy BouchardM.D. On: 09/30/2014 10:49   ASSESSMENT AND PLAN: This is a very pleasant 69years old white male with recurrent non-small cell lung cancer, adenocarcinoma with positive EGFR mutation in exon 21 that was initially diagnosed as stage IA non-small cell lung cancer status post right upper lobectomy and has been observation since October of 2007. He is status post 6 weeks' treatment with Tarceva 150 mg by mouth daily according to the BMS checkmate 370 clinical trial. He is currently on Tarceva 100 mg by mouth daily for the last 3 months and tolerating it fairly well. The recent CT scan of the chest, abdomen and pelvis showed no evidence for disease progression. I discussed the scan results with the patient and his wife. I recommended for the patient to continue his current treatment with Tarceva 100 mg by mouth daily. For the skin rash, he will continue to apply clindamycin lotion and also advise him to use hydrocortisone cream as needed. The patient has asymptomatic elevation of TSH and I don't think this is related to his current treatment with Tarceva. We will continue to monitor him closely and follow up by his primary care physician. He would come back for follow-up visit in 2 weeks for reevaluation and management of any adverse effect of his treatment. The patient was advised to call immediately if he has any other concerning symptoms in the interval.  All questions were answered. The patient knows to call the clinic with any problems, questions or concerns. We can certainly see the patient much sooner if necessary.  Disclaimer: This note was dictated with voice recognition software. Similar sounding words can inadvertently be transcribed and may not be corrected upon review.

## 2014-10-09 NOTE — Progress Notes (Signed)
BMS [id number] - questionnaires (PROs) - patient into the cancer center for routine visit. Patient was given PROs upon arrival to the cancer center. The patient completed his PROs ([id number] first and then the FACT-L booklet) before any study procedures were performed. I checked the PROs for completeness.  Research blood was drawn 08/28/14. The patient was thanked for his continued support of this clinical trial. Barb Kyrstin Campillo 10/09/2014 8:28 AM

## 2014-10-10 MED ORDER — ERLOTINIB HCL 100 MG PO TABS: 100.0000 mg | ORAL_TABLET | Freq: Every day | ORAL | Status: DC

## 2014-10-14 ENCOUNTER — Other Ambulatory Visit: Payer: Self-pay | Admitting: *Deleted

## 2014-10-14 DIAGNOSIS — C3431 Malignant neoplasm of lower lobe, right bronchus or lung: Secondary | ICD-10-CM

## 2014-10-16 ENCOUNTER — Other Ambulatory Visit: Payer: Self-pay | Admitting: Thoracic Surgery (Cardiothoracic Vascular Surgery)

## 2014-10-16 DIAGNOSIS — C3490 Malignant neoplasm of unspecified part of unspecified bronchus or lung: Secondary | ICD-10-CM

## 2014-10-22 ENCOUNTER — Telehealth: Payer: Self-pay | Admitting: Internal Medicine

## 2014-10-22 NOTE — Telephone Encounter (Signed)
AJ out - 7/21 f/u moved to MM - ok per MM. Spoke with patient he is aware of new time.

## 2014-10-23 ENCOUNTER — Encounter: Payer: Self-pay | Admitting: Medical Oncology

## 2014-10-23 ENCOUNTER — Encounter: Payer: Self-pay | Admitting: Internal Medicine

## 2014-10-23 ENCOUNTER — Other Ambulatory Visit (HOSPITAL_BASED_OUTPATIENT_CLINIC_OR_DEPARTMENT_OTHER): Payer: Medicare Other

## 2014-10-23 ENCOUNTER — Ambulatory Visit (HOSPITAL_BASED_OUTPATIENT_CLINIC_OR_DEPARTMENT_OTHER): Payer: Medicare Other | Admitting: Internal Medicine

## 2014-10-23 ENCOUNTER — Other Ambulatory Visit (HOSPITAL_COMMUNITY)
Admission: RE | Admit: 2014-10-23 | Discharge: 2014-10-23 | Disposition: A | Discharge status: Home / Self Care | Payer: Medicare Other | Source: Ambulatory Visit | Attending: Internal Medicine | Admitting: Internal Medicine

## 2014-10-23 VITALS — BP 117/83 | HR 73 | Temp 97.8°F | Resp 16 | Ht 72.0 in | Wt 242.5 lb

## 2014-10-23 DIAGNOSIS — Z006 Encounter for examination for normal comparison and control in clinical research program: Secondary | ICD-10-CM

## 2014-10-23 DIAGNOSIS — C3431 Malignant neoplasm of lower lobe, right bronchus or lung: Secondary | ICD-10-CM

## 2014-10-23 DIAGNOSIS — I2510 Atherosclerotic heart disease of native coronary artery without angina pectoris: Secondary | ICD-10-CM

## 2014-10-23 DIAGNOSIS — C3430 Malignant neoplasm of lower lobe, unspecified bronchus or lung: Secondary | ICD-10-CM | POA: Insufficient documentation

## 2014-10-23 LAB — CBC WITH DIFFERENTIAL/PLATELET
BASO%: 0.8 % (ref 0.0–2.0)
BASOS ABS: 0.1 10*3/uL (ref 0.0–0.1)
EOS%: 3.1 % (ref 0.0–7.0)
Eosinophils Absolute: 0.2 10*3/uL (ref 0.0–0.5)
HEMATOCRIT: 42.4 % (ref 38.4–49.9)
HGB: 14.3 g/dL (ref 13.0–17.1)
LYMPH#: 2.2 10*3/uL (ref 0.9–3.3)
LYMPH%: 33.8 % (ref 14.0–49.0)
MCH: 33.2 pg (ref 27.2–33.4)
MCHC: 33.9 g/dL (ref 32.0–36.0)
MCV: 97.9 fL (ref 79.3–98.0)
MONO#: 0.7 10*3/uL (ref 0.1–0.9)
MONO%: 10.2 % (ref 0.0–14.0)
NEUT%: 52.1 % (ref 39.0–75.0)
NEUTROS ABS: 3.4 10*3/uL (ref 1.5–6.5)
Platelets: 213 10*3/uL (ref 140–400)
RBC: 4.33 10*6/uL (ref 4.20–5.82)
RDW: 12.6 % (ref 11.0–14.6)
WBC: 6.6 10*3/uL (ref 4.0–10.3)

## 2014-10-23 LAB — LACTATE DEHYDROGENASE (CC13): LDH: 189 U/L (ref 125–245)

## 2014-10-23 LAB — COMPREHENSIVE METABOLIC PANEL (CC13)
ALT: 35 U/L (ref 0–55)
AST: 30 U/L (ref 5–34)
Albumin: 4.1 g/dL (ref 3.5–5.0)
Alkaline Phosphatase: 64 U/L (ref 40–150)
Anion Gap: 6 mEq/L (ref 3–11)
BUN: 16.6 mg/dL (ref 7.0–26.0)
CALCIUM: 9.8 mg/dL (ref 8.4–10.4)
CO2: 29 mEq/L (ref 22–29)
Chloride: 104 mEq/L (ref 98–109)
Creatinine: 1 mg/dL (ref 0.7–1.3)
EGFR: 79 mL/min/{1.73_m2} — ABNORMAL LOW (ref >90)
Glucose: 99 mg/dl (ref 70–140)
POTASSIUM: 3.8 meq/L (ref 3.5–5.1)
Sodium: 139 mEq/L (ref 136–145)
Total Bilirubin: 0.48 mg/dL (ref 0.20–1.20)
Total Protein: 6.8 g/dL (ref 6.4–8.3)

## 2014-10-23 LAB — MAGNESIUM (CC13): MAGNESIUM: 2.4 mg/dL (ref 1.5–2.5)

## 2014-10-23 LAB — PHOSPHORUS: Phosphorus: 2.8 mg/dL (ref 2.5–4.6)

## 2014-10-23 NOTE — Progress Notes (Signed)
[place] [phone]   [phone]  OFFICE PROGRESS NOTE  [name],[name], MD 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 Roxan Hockey on 01/23/2006.  2) Tarceva 150 mg by mouth daily as part of the BMS checkmate 370 clinical trial. Status post 6 weeks of treatment.   CURRENT THERAPY: Tarceva 100 mg by mouth daily as part of the BMS checkmate 370 clinical trial. Status post 4 months of treatment.  DISEASE STAGE: Recurrent  CHEMOTHERAPY INTENT: Palliative.  CURRENT # OF CHEMOTHERAPY CYCLES: 4  CURRENT ANTIEMETICS: None  CURRENT SMOKING STATUS: Non-smoker  ORAL CHEMOTHERAPY AND CONSENT: None  CURRENT BISPHOSPHONATES USE: None  LIVING WILL AND CODE STATUS: Full code.  INTERVAL HISTORY: [name] 69 y.o. male returns to the clinic today for followup visit accompanied by his wife. He is currently on Tarceva 100 mg by mouth daily. He continues to tolerate it much better but has grade 1 skin rash mainly on the face. He continues to apply the clindamycin lotion to the skin rash on the face area. He is feeling fine today with no other complaints. He denied having any significant chest pain, shortness of breath, cough or hemoptysis. The patient denied having any fever or chills, no nausea or vomiting and no diarrhea. He denied having any significant weight loss or night sweats.   MEDICAL HISTORY: Past Medical History  Diagnosis Date  . Hypertension   . Hypercholesterolemia   . Sinus disease     hx of  . History of pneumonia  last 3-4 yrs ago    3 -4 different times  . Hypothyroidism   . Sleep apnea    uses cpap setting opf 12  . History of agent Orange exposure 44-45 yrs ago  . Complication of anesthesia 2007    quit breathing with bronchoscopy, had to spend night  . lung ca dx'd 2007    lung right    ALLERGIES:  is allergic to lisinopril.  MEDICATIONS:  Current Outpatient Prescriptions  Medication Sig Dispense Refill  . amitriptyline (ELAVIL) 50 MG tablet Take 50 mg by mouth at bedtime.    [name] aspirin 81 MG tablet Take 81 mg by mouth every morning.     . cholecalciferol (VITAMIN D) 1000 UNITS tablet Take 2,000 Units by mouth daily.     . clindamycin (CLEOCIN-T) 1 % external solution Apply topically 2 (two) times daily. 240 mL 1  . diltiazem (CARDIZEM CD) 360 MG 24 hr capsule Take 360 mg by mouth every morning.     . erlotinib (TARCEVA) 100 MG tablet Take 1 tablet (100 mg total) by mouth daily. Take on an empty stomach 1 hour before meals or 2 hours after.    . levETIRAcetam (KEPPRA) 750 MG tablet     . levothyroxine (SYNTHROID, LEVOTHROID) 75 MCG tablet     . metoCLOPramide (REGLAN) 10 MG tablet Take 10 mg by mouth every 6 (six) hours as needed for nausea.     . metoprolol succinate (TOPROL-XL) 25 MG 24 hr tablet Take 25 mg by mouth every morning.     . Multiple Vitamins-Minerals (CENTRUM SILVER PO) Take 1 tablet by mouth daily.     [name]  naproxen sodium (ANAPROX) 550 MG tablet Take 550 mg by mouth 3 (three) times daily as needed for moderate pain.     . nitroGLYCERIN (NITROSTAT) 0.4 MG SL tablet Place 0.4 mg under the tongue every 5 (five) minutes as needed for chest pain.     . polyethylene glycol (MIRALAX / GLYCOLAX) packet Take 17 g by mouth daily.    . psyllium (METAMUCIL) 58.6 % powder Take 1 packet by mouth 3 (three) times daily.    . rosuvastatin (CRESTOR) 5 MG tablet Take 5 mg by mouth every morning.     . valsartan-hydrochlorothiazide (DIOVAN-HCT) 320-25 MG per tablet Take 1 tablet by mouth every morning.      No current facility-administered medications for this visit.     SURGICAL HISTORY:  Past Surgical History  Procedure Laterality Date  . Cystourethroscopy, gyrus turp.  04/16/2010  . Torn medial and lateral menisci, left knee.  11/06/2006  . Right video-assisted thoracoscopy, right upper lobectomy and  01/23/2006  . The olympus video bronchoscope was introduced via the right  12/21/2005  . Nasal sinus surgery  1987  . Tonsillectomy  1957  . Colonoscopy with propofol N/A 03/19/2013    Procedure: COLONOSCOPY WITH PROPOFOL;  Surgeon: Garlan Fair, MD;  Location: [facility];  Service: Endoscopy;  Laterality: N/A;    REVIEW OF SYSTEMS:  Constitutional: negative Eyes: negative Ears, nose, mouth, throat, and face: negative Respiratory: negative Cardiovascular: negative Gastrointestinal: negative Genitourinary:negative Integument/breast: positive for dryness and rash Hematologic/lymphatic: negative Musculoskeletal:negative Neurological: negative Behavioral/Psych: negative Endocrine: negative Allergic/Immunologic: negative   PHYSICAL EXAMINATION: General appearance: alert, cooperative and no distress Head: Normocephalic, without obvious abnormality, atraumatic Neck: no adenopathy Lymph nodes: Cervical, supraclavicular, and axillary nodes normal. Resp: clear to auscultation bilaterally Back: symmetric, no curvature. ROM normal. No CVA tenderness. Cardio: regular rate and rhythm, S1, S2 normal, no murmur, click, rub or gallop GI: soft, non-tender; bowel sounds normal; no masses,  no organomegaly Extremities: extremities normal, atraumatic, no cyanosis or edema Neurologic: Alert and oriented X 3, normal strength and tone. Normal symmetric reflexes. Normal coordination and gait  ECOG PERFORMANCE STATUS: 1 - Symptomatic but completely ambulatory  Blood pressure 117/83, pulse 73, temperature 97.8 F (36.6 C), temperature source Oral, resp. rate 16, height 6' (1.829 m), weight 242 lb 8 oz (109.997 kg), SpO2 99 %.  LABORATORY DATA: Lab  Results  Component Value Date   WBC 6.6 10/23/2014   HGB 14.3 10/23/2014   HCT 42.4 10/23/2014   MCV 97.9 10/23/2014   PLT 213 10/23/2014      Chemistry      Component Value Date/Time   NA 139 10/23/2014 0806   NA 138 07/17/2014 1000   NA 139 10/20/2011 0903   K 3.8 10/23/2014 0806   K 3.3* 07/17/2014 1000   K 3.4 10/20/2011 0903   CL 107 07/17/2014 1000   CL 102 04/24/2012 0853   CL 101 10/20/2011 0903   CO2 29 10/23/2014 0806   CO2 26 07/17/2014 1000   CO2 29 10/20/2011 0903   BUN 16.6 10/23/2014 0806   BUN 16 07/17/2014 1000   BUN 12 10/20/2011 0903   CREATININE 1.0 10/23/2014 0806   CREATININE 0.82 07/17/2014 1000   CREATININE 0.9 10/20/2011 0903      Component Value Date/Time   CALCIUM 9.8 10/23/2014 0806   CALCIUM 8.9 07/17/2014 1000   CALCIUM 9.0 10/20/2011 0903   ALKPHOS 64 10/23/2014 0806   ALKPHOS 53 07/17/2014 1000   ALKPHOS 52 10/20/2011  0903   AST 30 10/23/2014 0806   AST 24 07/17/2014 1000   AST 24 10/20/2011 0903   ALT 35 10/23/2014 0806   ALT 28 07/17/2014 1000   ALT 31 10/20/2011 0903   BILITOT 0.48 10/23/2014 0806   BILITOT 0.8 07/17/2014 1000   BILITOT 0.60 10/20/2011 0903       RADIOGRAPHIC STUDIES: Ct Chest W Contrast  10/01/2014   ADDENDUM REPORT: 10/01/2014 11:25  ADDENDUM: RECIST 1.1  Target Lesions:  1. Mixed solid and ground-glass nodule in the right lower lobe measures 20 mm on image 21 series 4 compared to 20 mm on prior.  2. Solid lesion in the right lower lobe measures 11 mm image 24, series 4 compared to 13 mm on prior. Non-target Lesions:  1. Small 8 mm ground-glass nodule image 26, series 4 unchanged.   Electronically Signed   By: Suzy Bouchard M.D.   On: 10/01/2014 11:25   10/01/2014   CLINICAL DATA:  Restaging lung cancer. Oral chemotherapy ongoing. Right lung lobectomy.  EXAM: CT CHEST, ABDOMEN, WITH CONTRAST  TECHNIQUE: Multidetector CT imaging of the chest, abdomen and was performed following the standard protocol during  bolus administration of intravenous contrast.  CONTRAST:  158m OMNIPAQUE IOHEXOL 300 MG/ML  SOLN  COMPARISON:  CT 08/05/2014  FINDINGS: CT CHEST FINDINGS  Mediastinum/Nodes: No axillary supraclavicular adenopathy. No mediastinal hilar adenopathy. No pericardial fluid. Surgical clips along the right mediastinal border.  Lungs/Pleura:  Mixed solid and ground-glass nodule in the right lower lobe measures 20 mm on image 21 series 4 compared to 20 mm on prior.  Solid lesion in the right lower lobe measures 11 mm image 24, series 4 compared to 13 mm on prior. Postsurgical change in the right hemi thorax consistent with upper lobectomy.  Small 8 mm ground-glass nodule image 26, series 4 unchanged.  No left pulmonary nodules.  CT ABDOMEN AND PELVIS FINDINGS  Hepatobiliary: No focal hepatic lesion. 27 mm gallstone noted on image 51, series 2.  Pancreas: Pancreas is normal. No ductal dilatation. No pancreatic inflammation.  Spleen: Normal spleen  Adrenals/urinary tract: Adrenal glands and kidneys are normal. The ureters and bladder normal.  Stomach/Bowel: Stomach and limited view of the bowel are unremarkable.  Vascular/Lymphatic: Abdominal aorta is normal caliber. No retroperitoneal periportal adenopathy.  Musculoskeletal: No aggressive osseous lesion.  IMPRESSION: 1. Stable right lower lobe pulmonary nodules.  No new nodularity. 2. No mediastinal lymphadenopathy. 3. No evidence of metastasis in the abdomen.  Electronically Signed: By: SSuzy BouchardM.D. On: 09/30/2014 10:49   Ct Abdomen W Contrast  10/01/2014   ADDENDUM REPORT: 10/01/2014 11:25  ADDENDUM: RECIST 1.1  Target Lesions:  1. Mixed solid and ground-glass nodule in the right lower lobe measures 20 mm on image 21 series 4 compared to 20 mm on prior.  2. Solid lesion in the right lower lobe measures 11 mm image 24, series 4 compared to 13 mm on prior. Non-target Lesions:  1. Small 8 mm ground-glass nodule image 26, series 4 unchanged.   Electronically Signed    By: SSuzy BouchardM.D.   On: 10/01/2014 11:25   10/01/2014   CLINICAL DATA:  Restaging lung cancer. Oral chemotherapy ongoing. Right lung lobectomy.  EXAM: CT CHEST, ABDOMEN, WITH CONTRAST  TECHNIQUE: Multidetector CT imaging of the chest, abdomen and was performed following the standard protocol during bolus administration of intravenous contrast.  CONTRAST:  1047mOMNIPAQUE IOHEXOL 300 MG/ML  SOLN  COMPARISON:  CT 08/05/2014  FINDINGS: CT  CHEST FINDINGS  Mediastinum/Nodes: No axillary supraclavicular adenopathy. No mediastinal hilar adenopathy. No pericardial fluid. Surgical clips along the right mediastinal border.  Lungs/Pleura:  Mixed solid and ground-glass nodule in the right lower lobe measures 20 mm on image 21 series 4 compared to 20 mm on prior.  Solid lesion in the right lower lobe measures 11 mm image 24, series 4 compared to 13 mm on prior. Postsurgical change in the right hemi thorax consistent with upper lobectomy.  Small 8 mm ground-glass nodule image 26, series 4 unchanged.  No left pulmonary nodules.  CT ABDOMEN AND PELVIS FINDINGS  Hepatobiliary: No focal hepatic lesion. 27 mm gallstone noted on image 51, series 2.  Pancreas: Pancreas is normal. No ductal dilatation. No pancreatic inflammation.  Spleen: Normal spleen  Adrenals/urinary tract: Adrenal glands and kidneys are normal. The ureters and bladder normal.  Stomach/Bowel: Stomach and limited view of the bowel are unremarkable.  Vascular/Lymphatic: Abdominal aorta is normal caliber. No retroperitoneal periportal adenopathy.  Musculoskeletal: No aggressive osseous lesion.  IMPRESSION: 1. Stable right lower lobe pulmonary nodules.  No new nodularity. 2. No mediastinal lymphadenopathy. 3. No evidence of metastasis in the abdomen.  Electronically Signed: By: Suzy Bouchard M.D. On: 09/30/2014 10:49   ASSESSMENT AND PLAN: This is a very pleasant 69 years old white male with recurrent non-small cell lung cancer, adenocarcinoma with positive  EGFR mutation in exon 21 that was initially diagnosed as stage IA non-small cell lung cancer status post right upper lobectomy and has been observation since October of 2007. He is status post 6 weeks' treatment with Tarceva 150 mg by mouth daily according to the BMS checkmate 370 clinical trial. He is currently on Tarceva 100 mg by mouth daily for the last 4 months and tolerating it fairly well.  I recommended for the patient to continue his current treatment with Tarceva 100 mg by mouth daily. He would come back for follow-up visit in 2 weeks for reevaluation and management of any adverse effect of his treatment. The patient was advised to call immediately if he has any other concerning symptoms in the interval.  All questions were answered. The patient knows to call the clinic with any problems, questions or concerns. We can certainly see the patient much sooner if necessary.  Disclaimer: This note was dictated with voice recognition software. Similar sounding words can inadvertently be transcribed and may not be corrected upon review.

## 2014-10-23 NOTE — Progress Notes (Signed)
BMS 370: Group D, Arm A- Erlotinib Met with patient and spouse this morning during scheduled 2 week appointment with Dr. [name], after patient's lab appt. Resting vital signs obtained, O2 saturation @ 99%. Concomitant medications reviewed and patient reports no new medications or changes to his current medications. Patient reports no new symptoms or changes. Patient continues to report rash, some to his chest, but mostly to his face and that it does itch, he states the clindamycin helps. He reports fatigue, but no more so than what he has been having. Also reports some SOB with exertion. Patient specifically denies; nausea, vomiting, pain, bowel or bladder issues, neuropathy, swelling, appetite problems, headaches or eye problems. Patient also  reports no difficulties with taking study drug as directed. Inquired as to whether patient had followed up the Dr. Inda Merlin, per patient, he had called Dr. Inda Merlin regarding TSH levels, does not have appointment but Dr. Inda Merlin' office "not concerned with one time elevation." All patient's questions answered to his satisfaction. Per Dr. [name], patient cleared to continue with treatment. I encouraged patient to call clinic with questions/concerns. Return appointment scheduled 08/04 Adele Dan, RN, BSN Clinical Research 10/23/2014 10:09 AM

## 2014-11-04 ENCOUNTER — Encounter: Payer: Self-pay | Admitting: *Deleted

## 2014-11-04 DIAGNOSIS — C3431 Malignant neoplasm of lower lobe, right bronchus or lung: Secondary | ICD-10-CM

## 2014-11-05 ENCOUNTER — Encounter: Payer: Self-pay | Admitting: Internal Medicine

## 2014-11-06 ENCOUNTER — Ambulatory Visit (HOSPITAL_COMMUNITY): Payer: Medicare Other

## 2014-11-06 ENCOUNTER — Encounter: Payer: Self-pay | Admitting: Physician Assistant

## 2014-11-06 ENCOUNTER — Ambulatory Visit (HOSPITAL_BASED_OUTPATIENT_CLINIC_OR_DEPARTMENT_OTHER): Payer: Medicare Other | Admitting: Physician Assistant

## 2014-11-06 ENCOUNTER — Other Ambulatory Visit (HOSPITAL_BASED_OUTPATIENT_CLINIC_OR_DEPARTMENT_OTHER): Payer: Medicare Other

## 2014-11-06 ENCOUNTER — Telehealth: Payer: Self-pay | Admitting: Internal Medicine

## 2014-11-06 ENCOUNTER — Other Ambulatory Visit (HOSPITAL_COMMUNITY)
Admission: RE | Admit: 2014-11-06 | Discharge: 2014-11-06 | Disposition: A | Discharge status: Home / Self Care | Payer: Medicare Other | Source: Ambulatory Visit | Attending: Internal Medicine | Admitting: Internal Medicine

## 2014-11-06 VITALS — BP 119/78 | HR 70 | Temp 97.7°F | Resp 18 | Ht 72.0 in | Wt 240.2 lb

## 2014-11-06 DIAGNOSIS — C3431 Malignant neoplasm of lower lobe, right bronchus or lung: Secondary | ICD-10-CM

## 2014-11-06 DIAGNOSIS — Z006 Encounter for examination for normal comparison and control in clinical research program: Secondary | ICD-10-CM | POA: Diagnosis not present

## 2014-11-06 DIAGNOSIS — C3490 Malignant neoplasm of unspecified part of unspecified bronchus or lung: Secondary | ICD-10-CM | POA: Insufficient documentation

## 2014-11-06 LAB — CBC WITH DIFFERENTIAL/PLATELET
BASO%: 1 % (ref 0.0–2.0)
Basophils Absolute: 0.1 10*3/uL (ref 0.0–0.1)
EOS ABS: 0.2 10*3/uL (ref 0.0–0.5)
EOS%: 3.3 % (ref 0.0–7.0)
HCT: 42.8 % (ref 38.4–49.9)
HGB: 14.6 g/dL (ref 13.0–17.1)
LYMPH%: 33.9 % (ref 14.0–49.0)
MCH: 33.5 pg — ABNORMAL HIGH (ref 27.2–33.4)
MCHC: 34.2 g/dL (ref 32.0–36.0)
MCV: 97.9 fL (ref 79.3–98.0)
MONO#: 0.7 10*3/uL (ref 0.1–0.9)
MONO%: 9.6 % (ref 0.0–14.0)
NEUT#: 3.7 10*3/uL (ref 1.5–6.5)
NEUT%: 52.2 % (ref 39.0–75.0)
PLATELETS: 225 10*3/uL (ref 140–400)
RBC: 4.37 10*6/uL (ref 4.20–5.82)
RDW: 12.9 % (ref 11.0–14.6)
WBC: 7 10*3/uL (ref 4.0–10.3)
lymph#: 2.4 10*3/uL (ref 0.9–3.3)

## 2014-11-06 LAB — COMPREHENSIVE METABOLIC PANEL (CC13)
ALBUMIN: 4.1 g/dL (ref 3.5–5.0)
ALK PHOS: 72 U/L (ref 40–150)
ALT: 40 U/L (ref 0–55)
AST: 28 U/L (ref 5–34)
Anion Gap: 7 mEq/L (ref 3–11)
BILIRUBIN TOTAL: 0.35 mg/dL (ref 0.20–1.20)
BUN: 16.9 mg/dL (ref 7.0–26.0)
CHLORIDE: 105 meq/L (ref 98–109)
CO2: 28 meq/L (ref 22–29)
Calcium: 9.6 mg/dL (ref 8.4–10.4)
Creatinine: 1.1 mg/dL (ref 0.7–1.3)
EGFR: 67 mL/min/{1.73_m2} — ABNORMAL LOW (ref >90)
Glucose: 101 mg/dl (ref 70–140)
Potassium: 3.7 mEq/L (ref 3.5–5.1)
SODIUM: 141 meq/L (ref 136–145)
Total Protein: 6.8 g/dL (ref 6.4–8.3)

## 2014-11-06 LAB — LACTATE DEHYDROGENASE (CC13): LDH: 184 U/L (ref 125–245)

## 2014-11-06 LAB — MAGNESIUM (CC13): Magnesium: 2.4 mg/dl (ref 1.5–2.5)

## 2014-11-06 LAB — PHOSPHORUS: PHOSPHORUS: 3.6 mg/dL (ref 2.5–4.6)

## 2014-11-06 MED ORDER — ERLOTINIB HCL 100 MG PO TABS: 100.0000 mg | ORAL_TABLET | Freq: Every day | ORAL | Status: DC

## 2014-11-06 MED ORDER — CLINDAMYCIN PHOSPHATE 1 % EX SOLN: Freq: Two times a day (BID) | CUTANEOUS | Status: DC

## 2014-11-06 NOTE — Progress Notes (Signed)
11/06/2014 0945  BMS 370 cycle 12, week 22   Group D, Arm A,  erlotinib only Patient in to clinic with his wife. His labs were drawn. Vital signs and pulse oximetry were obtained at rest. Patient reports that he continues to have an acne-like  rash on his face, head, and chest. He says it is no worse and a little  better than last visit.  He complains of slight itching, but denies scalp pain or other pain.  He reports SOB only with exertion. He complains of fatigue and says it has not gotten worse. He denies diarrhea, constipation or any other changes since his last visit. He reports that overall he feels good, except that it has been so hot lately with the weather. Patient reports that he has not missed taking any study drug. Based on lab results review and physical exam, patient was cleared to continue treatment by Awilda Metro, PA and Dr. [name].  The patient denies questions or concerns.  He is due a CT scan week 25. Marcellus Scott, RN Clinical Research

## 2014-11-06 NOTE — Patient Instructions (Signed)
Follow up in 2 weeks with a restaging CT scan of your chest and abdomen to re-evaluate your disease

## 2014-11-06 NOTE — Progress Notes (Addendum)
[place] [phone]   [phone]  OFFICE PROGRESS NOTE  [name],[name], MD 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 Roxan Hockey on 01/23/2006.  2) Tarceva 150 mg by mouth daily as part of the BMS checkmate 370 clinical trial. Status post 6 weeks of treatment.   CURRENT THERAPY: Tarceva 100 mg by mouth daily as part of the BMS checkmate 370 clinical trial. Status post 4.5 months of treatment.  DISEASE STAGE: Recurrent  CHEMOTHERAPY INTENT: Palliative.  CURRENT # OF CHEMOTHERAPY CYCLES: 4.5  CURRENT ANTIEMETICS: None  CURRENT SMOKING STATUS: Non-smoker  ORAL CHEMOTHERAPY AND CONSENT: None  CURRENT BISPHOSPHONATES USE: None  LIVING WILL AND CODE STATUS: Full code.  INTERVAL HISTORY: [name] 69 y.o. male returns to the clinic today for followup visit accompanied by his wife. He is currently on Tarceva 100 mg by mouth daily. He continues to tolerate this dose much better. He continues to have grade 1 skin rash mainly on the face. He continues to apply the clindamycin lotion to the skin rash on the face area. He requests refill prescriptions for Tarceva and clindamycin lotion. He is feeling fine today with no other complaints. He denied having any significant chest pain, shortness of breath, cough or hemoptysis. The patient denied having any fever or chills, no nausea or vomiting and no diarrhea. He denied having any significant weight loss or night sweats.   MEDICAL HISTORY: Past Medical History  Diagnosis Date  . Hypertension   . Hypercholesterolemia   . Sinus disease     hx of  . History of  pneumonia  last 3-4 yrs ago    3 -4 different times  . Hypothyroidism   . Sleep apnea     uses cpap setting opf 12  . History of agent Orange exposure 44-45 yrs ago  . Complication of anesthesia 2007    quit breathing with bronchoscopy, had to spend night  . lung ca dx'd 2007    lung right    ALLERGIES:  is allergic to lisinopril.  MEDICATIONS:  Current Outpatient Prescriptions  Medication Sig Dispense Refill  . amitriptyline (ELAVIL) 50 MG tablet Take 50 mg by mouth at bedtime.    [name] aspirin 81 MG tablet Take 81 mg by mouth every morning.     . cholecalciferol (VITAMIN D) 1000 UNITS tablet Take 2,000 Units by mouth daily.     . clindamycin (CLEOCIN-T) 1 % external solution Apply topically 2 (two) times daily. 240 mL 2  . diltiazem (CARDIZEM CD) 360 MG 24 hr capsule Take 360 mg by mouth every morning.     . erlotinib (TARCEVA) 100 MG tablet Take 1 tablet (100 mg total) by mouth daily. Take on an empty stomach 1 hour before meals or 2 hours after. 30 tablet 2  . levETIRAcetam (KEPPRA) 750 MG tablet     . levothyroxine (SYNTHROID, LEVOTHROID) 75 MCG tablet     . metoCLOPramide (REGLAN) 10 MG tablet Take 10 mg by mouth every 6 (six) hours as needed for nausea.     . metoprolol succinate (TOPROL-XL) 25 MG 24 hr tablet Take 25 mg by mouth every morning.     [name]  Multiple Vitamins-Minerals (CENTRUM SILVER PO) Take 1 tablet by mouth daily.     . naproxen sodium (ANAPROX) 550 MG tablet Take 550 mg by mouth 3 (three) times daily as needed for moderate pain.     . nitroGLYCERIN (NITROSTAT) 0.4 MG SL tablet Place 0.4 mg under the tongue every 5 (five) minutes as needed for chest pain.     . polyethylene glycol (MIRALAX / GLYCOLAX) packet Take 17 g by mouth daily.    . psyllium (METAMUCIL) 58.6 % powder Take 1 packet by mouth 3 (three) times daily.    . rosuvastatin (CRESTOR) 5 MG tablet Take 5 mg by mouth every morning.     . valsartan-hydrochlorothiazide (DIOVAN-HCT) 320-25 MG per tablet Take 1  tablet by mouth every morning.      No current facility-administered medications for this visit.    SURGICAL HISTORY:  Past Surgical History  Procedure Laterality Date  . Cystourethroscopy, gyrus turp.  04/16/2010  . Torn medial and lateral menisci, left knee.  11/06/2006  . Right video-assisted thoracoscopy, right upper lobectomy and  01/23/2006  . The olympus video bronchoscope was introduced via the right  12/21/2005  . Nasal sinus surgery  1987  . Tonsillectomy  1957  . Colonoscopy with propofol N/A 03/19/2013    Procedure: COLONOSCOPY WITH PROPOFOL;  Surgeon: Garlan Fair, MD;  Location: [facility];  Service: Endoscopy;  Laterality: N/A;    REVIEW OF SYSTEMS:  Constitutional: negative Eyes: negative Ears, nose, mouth, throat, and face: negative Respiratory: negative Cardiovascular: negative Gastrointestinal: negative Genitourinary:negative Integument/breast: positive for dryness and rash Hematologic/lymphatic: negative Musculoskeletal:negative Neurological: negative Behavioral/Psych: negative Endocrine: negative Allergic/Immunologic: negative   PHYSICAL EXAMINATION: General appearance: alert, cooperative and no distress Head: Normocephalic, without obvious abnormality, atraumatic Neck: no adenopathy Lymph nodes: Cervical, supraclavicular, and axillary nodes normal. Resp: clear to auscultation bilaterally Back: symmetric, no curvature. ROM normal. No CVA tenderness. Cardio: regular rate and rhythm, S1, S2 normal, no murmur, click, rub or gallop GI: soft, non-tender; bowel sounds normal; no masses,  no organomegaly Extremities: extremities normal, atraumatic, no cyanosis or edema Neurologic: Alert and oriented X 3, normal strength and tone. Normal symmetric reflexes. Normal coordination and gait  ECOG PERFORMANCE STATUS: 1 - Symptomatic but completely ambulatory  Blood pressure 119/78, pulse 70, temperature 97.7 F (36.5 C), temperature source Oral, resp. rate  18, height 6' (1.829 m), weight 240 lb 3.2 oz (108.954 kg), SpO2 98 %.  LABORATORY DATA: Lab Results  Component Value Date   WBC 7.0 11/06/2014   HGB 14.6 11/06/2014   HCT 42.8 11/06/2014   MCV 97.9 11/06/2014   PLT 225 11/06/2014      Chemistry      Component Value Date/Time   NA 141 11/06/2014 0847   NA 138 07/17/2014 1000   NA 139 10/20/2011 0903   K 3.7 11/06/2014 0847   K 3.3* 07/17/2014 1000   K 3.4 10/20/2011 0903   CL 107 07/17/2014 1000   CL 102 04/24/2012 0853   CL 101 10/20/2011 0903   CO2 28 11/06/2014 0847   CO2 26 07/17/2014 1000   CO2 29 10/20/2011 0903   BUN 16.9 11/06/2014 0847   BUN 16 07/17/2014 1000   BUN 12 10/20/2011 0903   CREATININE 1.1 11/06/2014 0847   CREATININE 0.82 07/17/2014 1000   CREATININE 0.9 10/20/2011 0903      Component Value Date/Time   CALCIUM 9.6 11/06/2014 0847   CALCIUM 8.9 07/17/2014 1000   CALCIUM 9.0 10/20/2011 0903  ALKPHOS 72 11/06/2014 0847   ALKPHOS 53 07/17/2014 1000   ALKPHOS 52 10/20/2011 0903   AST 28 11/06/2014 0847   AST 24 07/17/2014 1000   AST 24 10/20/2011 0903   ALT 40 11/06/2014 0847   ALT 28 07/17/2014 1000   ALT 31 10/20/2011 0903   BILITOT 0.35 11/06/2014 0847   BILITOT 0.8 07/17/2014 1000   BILITOT 0.60 10/20/2011 0903       RADIOGRAPHIC STUDIES: No results found. ASSESSMENT AND PLAN: This is a very pleasant 69 years old white male with recurrent non-small cell lung cancer, adenocarcinoma with positive EGFR mutation in exon 21 that was initially diagnosed as stage IA non-small cell lung cancer status post right upper lobectomy and has been observation since October of 2007. He is status post 6 weeks' treatment with Tarceva 150 mg by mouth daily according to the BMS checkmate 370 clinical trial. He is currently on Tarceva 100 mg by mouth daily for the last 4 and half months and tolerating it fairly well.  The patient was discussed with and also seen by Dr. [name]. He will continue his current  treatment with Tarceva 100 mg by mouth daily. He will return in 2 weeks with a restaging CT scan of thefor reevaluation and management of any adverse effect of his treatment. Refill prescriptions for his Tarceva and clindamycin lotion were sent to the [facility]. The patient was advised to call immediately if he has any other concerning symptoms in the interval.  All questions were answered. The patient knows to call the clinic with any problems, questions or concerns. We can certainly see the patient much sooner if necessary.  Carlton Adam, PA-C 11/06/2014   ADDENDUM: Hematology/Oncology Attending: I had a face to face encounter with the patient. I recommended his care plan. This is a very pleasant 69 years old white male with recurrent non-small cell lung cancer, adenocarcinoma with positive EGFR mutation who is currently undergoing treatment with Tarceva 100 mg by mouth daily and tolerating his treatment fairly well. I recommended for the patient to continue his current treatment with Tarceva with the same dose. He will come back for follow-up visit in 2 weeks for reevaluation after repeating CT scan of the chest of his disease. The patient was advised to call immediately if he has any concerning symptoms in the interval.  Disclaimer: This note was dictated with voice recognition software. Similar sounding words can inadvertently be transcribed and may be missed upon review. Eilleen Kempf., MD 11/10/2014

## 2014-11-06 NOTE — Telephone Encounter (Signed)
per pof to sch pt appt-gave pt copy of avs °

## 2014-11-13 ENCOUNTER — Ambulatory Visit: Payer: Medicare Other | Admitting: Internal Medicine

## 2014-11-17 ENCOUNTER — Telehealth: Payer: Self-pay | Admitting: *Deleted

## 2014-11-17 DIAGNOSIS — L304 Erythema intertrigo: Secondary | ICD-10-CM | POA: Diagnosis not present

## 2014-11-17 DIAGNOSIS — B351 Tinea unguium: Secondary | ICD-10-CM | POA: Diagnosis not present

## 2014-11-17 NOTE — Telephone Encounter (Signed)
11/17/2014 1150 Received phone call from patient stating he had just come from seeing the dermatologist about a rash in his scrotal area.  He wanted to let this RN know for study purposes.  He stated the area is red and itches and he related it to the heat and sweating.  He stated the rash on his head and face is unchanged and that he has had no new areas except for his scrotal area.  He reports he has had this type of scrotal rash in years past.  He stated the dermatologist prescribed a cream, Nizoral Cream mixed with Cutivate, and wants to know if this is okay with Dr. [name].  This RN will inform Dr. [name] of above and will call patient back after speaking with MD. Marcellus Scott, RN Clinical Research

## 2014-11-17 NOTE — Telephone Encounter (Signed)
11/17/2014 1409 Spoke with Dr. [name] regarding the patient's phone call and description of his rash in scrotal area.  Per Dr. [name], okay to get prescription filled for Nizoral cream mixed with Cultivate.  Reminded patient to call if symptoms worsen.  Patient has a scheduled appointment in clinic this week on 11/20/14.  Patient verbalized understanding. Marcellus Scott, RN

## 2014-11-18 ENCOUNTER — Ambulatory Visit: Payer: Medicare Other | Admitting: Thoracic Surgery (Cardiothoracic Vascular Surgery)

## 2014-11-18 ENCOUNTER — Other Ambulatory Visit: Payer: Medicare Other

## 2014-11-19 ENCOUNTER — Encounter: Payer: Self-pay | Admitting: *Deleted

## 2014-11-19 ENCOUNTER — Other Ambulatory Visit: Payer: Self-pay | Admitting: Internal Medicine

## 2014-11-19 DIAGNOSIS — C3431 Malignant neoplasm of lower lobe, right bronchus or lung: Secondary | ICD-10-CM

## 2014-11-20 ENCOUNTER — Encounter: Payer: Self-pay | Admitting: Internal Medicine

## 2014-11-20 ENCOUNTER — Other Ambulatory Visit (HOSPITAL_BASED_OUTPATIENT_CLINIC_OR_DEPARTMENT_OTHER): Payer: Medicare Other

## 2014-11-20 ENCOUNTER — Ambulatory Visit (HOSPITAL_BASED_OUTPATIENT_CLINIC_OR_DEPARTMENT_OTHER): Payer: Medicare Other | Admitting: Internal Medicine

## 2014-11-20 ENCOUNTER — Other Ambulatory Visit (HOSPITAL_COMMUNITY)
Admission: RE | Admit: 2014-11-20 | Discharge: 2014-11-20 | Disposition: A | Discharge status: Home / Self Care | Payer: Medicare Other | Source: Ambulatory Visit | Attending: Internal Medicine | Admitting: Internal Medicine

## 2014-11-20 VITALS — BP 134/84 | HR 64 | Temp 97.9°F | Resp 18 | Ht 72.0 in | Wt 243.1 lb

## 2014-11-20 DIAGNOSIS — Z79899 Other long term (current) drug therapy: Secondary | ICD-10-CM

## 2014-11-20 DIAGNOSIS — Z006 Encounter for examination for normal comparison and control in clinical research program: Secondary | ICD-10-CM | POA: Diagnosis not present

## 2014-11-20 DIAGNOSIS — C3431 Malignant neoplasm of lower lobe, right bronchus or lung: Secondary | ICD-10-CM

## 2014-11-20 LAB — COMPREHENSIVE METABOLIC PANEL (CC13)
ALT: 30 U/L (ref 0–55)
AST: 28 U/L (ref 5–34)
Albumin: 3.9 g/dL (ref 3.5–5.0)
Alkaline Phosphatase: 57 U/L (ref 40–150)
Anion Gap: 7 mEq/L (ref 3–11)
BUN: 15 mg/dL (ref 7.0–26.0)
CALCIUM: 9.3 mg/dL (ref 8.4–10.4)
CHLORIDE: 105 meq/L (ref 98–109)
CO2: 28 meq/L (ref 22–29)
Creatinine: 0.9 mg/dL (ref 0.7–1.3)
EGFR: 83 mL/min/{1.73_m2} — ABNORMAL LOW (ref >90)
Glucose: 99 mg/dl (ref 70–140)
Potassium: 3.9 mEq/L (ref 3.5–5.1)
SODIUM: 140 meq/L (ref 136–145)
Total Bilirubin: 0.4 mg/dL (ref 0.20–1.20)
Total Protein: 6.2 g/dL — ABNORMAL LOW (ref 6.4–8.3)

## 2014-11-20 LAB — MAGNESIUM (CC13): Magnesium: 2.3 mg/dl (ref 1.5–2.5)

## 2014-11-20 LAB — CBC WITH DIFFERENTIAL/PLATELET
BASO%: 0.8 % (ref 0.0–2.0)
Basophils Absolute: 0 10*3/uL (ref 0.0–0.1)
EOS%: 3.9 % (ref 0.0–7.0)
Eosinophils Absolute: 0.2 10*3/uL (ref 0.0–0.5)
HEMATOCRIT: 39.6 % (ref 38.4–49.9)
HGB: 13.7 g/dL (ref 13.0–17.1)
LYMPH#: 2.2 10*3/uL (ref 0.9–3.3)
LYMPH%: 41.4 % (ref 14.0–49.0)
MCH: 33.7 pg — ABNORMAL HIGH (ref 27.2–33.4)
MCHC: 34.6 g/dL (ref 32.0–36.0)
MCV: 97.4 fL (ref 79.3–98.0)
MONO#: 0.6 10*3/uL (ref 0.1–0.9)
MONO%: 10.8 % (ref 0.0–14.0)
NEUT#: 2.3 10*3/uL (ref 1.5–6.5)
NEUT%: 43.1 % (ref 39.0–75.0)
Platelets: 205 10*3/uL (ref 140–400)
RBC: 4.06 10*6/uL — AB (ref 4.20–5.82)
RDW: 13 % (ref 11.0–14.6)
WBC: 5.4 10*3/uL (ref 4.0–10.3)

## 2014-11-20 LAB — TSH CHCC: TSH: 3.37 m[IU]/L (ref 0.320–4.118)

## 2014-11-20 LAB — PHOSPHORUS: Phosphorus: 3.1 mg/dL (ref 2.5–4.6)

## 2014-11-20 LAB — LACTATE DEHYDROGENASE (CC13): LDH: 178 U/L (ref 125–245)

## 2014-11-20 NOTE — Progress Notes (Signed)
[place] [phone]   Fax:(336) [phone]  OFFICE PROGRESS NOTE  [name],[name], MD 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 Roxan Hockey on 01/23/2006.  2) Tarceva 150 mg by mouth daily as part of the BMS checkmate 370 clinical trial. Status post 6 weeks of treatment.   CURRENT THERAPY: Tarceva 100 mg by mouth daily as part of the BMS checkmate 370 clinical trial. Status post 5 months of treatment.  DISEASE STAGE: Recurrent  CHEMOTHERAPY INTENT: Palliative.  CURRENT # OF CHEMOTHERAPY CYCLES: 6  CURRENT ANTIEMETICS: None  CURRENT SMOKING STATUS: Non-smoker  ORAL CHEMOTHERAPY AND CONSENT: None  CURRENT BISPHOSPHONATES USE: None  LIVING WILL AND CODE STATUS: Full code.  INTERVAL HISTORY: [name] 69 y.o. male returns to the clinic today for followup visit accompanied by his wife. He is currently on Tarceva 100 mg by mouth daily. He continues to tolerate it much better but has grade 1 skin rash mainly on the face. He was also recently treated for fungal infection in the groin area by his dermatologist with Nizoral. He is feeling fine today with no other complaints. He denied having any significant chest pain, shortness of breath, cough or hemoptysis. The patient denied having any fever or chills, no nausea or vomiting and no diarrhea. He denied having any significant weight loss or night sweats.   MEDICAL HISTORY: Past Medical History  Diagnosis Date  . Hypertension   . Hypercholesterolemia   . Sinus disease     hx of  . History of pneumonia  last 3-4 yrs ago    3 -4 different times  . Hypothyroidism     . Sleep apnea     uses cpap setting opf 12  . History of agent Orange exposure 44-45 yrs ago  . Complication of anesthesia 2007    quit breathing with bronchoscopy, had to spend night  . lung ca dx'd 2007    lung right    ALLERGIES:  is allergic to lisinopril.  MEDICATIONS:  Current Outpatient Prescriptions  Medication Sig Dispense Refill  . amitriptyline (ELAVIL) 50 MG tablet Take 50 mg by mouth at bedtime.    [name] aspirin 81 MG tablet Take 81 mg by mouth every morning.     . cholecalciferol (VITAMIN D) 1000 UNITS tablet Take 2,000 Units by mouth daily.     . clindamycin (CLEOCIN-T) 1 % external solution Apply topically 2 (two) times daily. 240 mL 2  . diltiazem (CARDIZEM CD) 360 MG 24 hr capsule Take 360 mg by mouth every morning.     . erlotinib (TARCEVA) 100 MG tablet Take 1 tablet (100 mg total) by mouth daily. Take on an empty stomach 1 hour before meals or 2 hours after.    . levETIRAcetam (KEPPRA) 750 MG tablet     . levothyroxine (SYNTHROID, LEVOTHROID) 75 MCG tablet     . metoprolol succinate (TOPROL-XL) 25 MG 24 hr tablet Take 25 mg by mouth every morning.     . naproxen sodium (ANAPROX) 550 MG tablet Take 550 mg by mouth 3 (three) times daily as needed for moderate pain.     . polyethylene glycol (MIRALAX / GLYCOLAX)  packet Take 17 g by mouth daily.    . psyllium (METAMUCIL) 58.6 % powder Take 1 packet by mouth 3 (three) times daily.    . rosuvastatin (CRESTOR) 5 MG tablet Take 5 mg by mouth every morning.     . valsartan-hydrochlorothiazide (DIOVAN-HCT) 320-25 MG per tablet Take 1 tablet by mouth every morning.     . fluticasone (CUTIVATE) 0.05 % cream Apply 1 application topically.    . metoCLOPramide (REGLAN) 10 MG tablet Take 10 mg by mouth every 6 (six) hours as needed for nausea.     . Multiple Vitamins-Minerals (CENTRUM SILVER PO) Take 1 tablet by mouth daily.     . nitroGLYCERIN (NITROSTAT) 0.4 MG SL tablet Place 0.4 mg under the tongue every 5 (five) minutes as  needed for chest pain.      No current facility-administered medications for this visit.    SURGICAL HISTORY:  Past Surgical History  Procedure Laterality Date  . Cystourethroscopy, gyrus turp.  04/16/2010  . Torn medial and lateral menisci, left knee.  11/06/2006  . Right video-assisted thoracoscopy, right upper lobectomy and  01/23/2006  . The olympus video bronchoscope was introduced via the right  12/21/2005  . Nasal sinus surgery  1987  . Tonsillectomy  1957  . Colonoscopy with propofol N/A 03/19/2013    Procedure: COLONOSCOPY WITH PROPOFOL;  Surgeon: Garlan Fair, MD;  Location: [facility];  Service: Endoscopy;  Laterality: N/A;    REVIEW OF SYSTEMS:  Constitutional: negative Eyes: negative Ears, nose, mouth, throat, and face: negative Respiratory: negative Cardiovascular: negative Gastrointestinal: negative Genitourinary:negative Integument/breast: positive for dryness and rash Hematologic/lymphatic: negative Musculoskeletal:negative Neurological: negative Behavioral/Psych: negative Endocrine: negative Allergic/Immunologic: negative   PHYSICAL EXAMINATION: General appearance: alert, cooperative and no distress Head: Normocephalic, without obvious abnormality, atraumatic Neck: no adenopathy Lymph nodes: Cervical, supraclavicular, and axillary nodes normal. Resp: clear to auscultation bilaterally Back: symmetric, no curvature. ROM normal. No CVA tenderness. Cardio: regular rate and rhythm, S1, S2 normal, no murmur, click, rub or gallop GI: soft, non-tender; bowel sounds normal; no masses,  no organomegaly Extremities: extremities normal, atraumatic, no cyanosis or edema Neurologic: Alert and oriented X 3, normal strength and tone. Normal symmetric reflexes. Normal coordination and gait  The skin exam showed dried rash in the right inguinal area.   ECOG PERFORMANCE STATUS: 1 - Symptomatic but completely ambulatory  There were no vitals taken for this  visit.  LABORATORY DATA: Lab Results  Component Value Date   WBC 5.4 11/20/2014   HGB 13.7 11/20/2014   HCT 39.6 11/20/2014   MCV 97.4 11/20/2014   PLT 205 11/20/2014      Chemistry      Component Value Date/Time   NA 141 11/06/2014 0847   NA 138 07/17/2014 1000   NA 139 10/20/2011 0903   K 3.7 11/06/2014 0847   K 3.3* 07/17/2014 1000   K 3.4 10/20/2011 0903   CL 107 07/17/2014 1000   CL 102 04/24/2012 0853   CL 101 10/20/2011 0903   CO2 28 11/06/2014 0847   CO2 26 07/17/2014 1000   CO2 29 10/20/2011 0903   BUN 16.9 11/06/2014 0847   BUN 16 07/17/2014 1000   BUN 12 10/20/2011 0903   CREATININE 1.1 11/06/2014 0847   CREATININE 0.82 07/17/2014 1000   CREATININE 0.9 10/20/2011 0903      Component Value Date/Time   CALCIUM 9.6 11/06/2014 0847   CALCIUM 8.9 07/17/2014 1000   CALCIUM 9.0 10/20/2011 0903   ALKPHOS 72  11/06/2014 0847   ALKPHOS 53 07/17/2014 1000   ALKPHOS 52 10/20/2011 0903   AST 28 11/06/2014 0847   AST 24 07/17/2014 1000   AST 24 10/20/2011 0903   ALT 40 11/06/2014 0847   ALT 28 07/17/2014 1000   ALT 31 10/20/2011 0903   BILITOT 0.35 11/06/2014 0847   BILITOT 0.8 07/17/2014 1000   BILITOT 0.60 10/20/2011 0903       RADIOGRAPHIC STUDIES: No results found. ASSESSMENT AND PLAN: This is a very pleasant 69 years old white male with recurrent non-small cell lung cancer, adenocarcinoma with positive EGFR mutation in exon 21 that was initially diagnosed as stage IA non-small cell lung cancer status post right upper lobectomy and has been observation since October of 2007. He is status post 6 weeks' treatment with Tarceva 150 mg by mouth daily according to the BMS checkmate 370 clinical trial. He is currently on Tarceva 100 mg by mouth daily for the last 5 months and tolerating it fairly well.  I recommended for the patient to continue his current treatment with Tarceva 100 mg by mouth daily. He would come back for follow-up visit in 2 weeks for  reevaluation and management of any adverse effect of his treatment after repeating CT scan of the chest for restaging of his disease. The patient was advised to call immediately if he has any other concerning symptoms in the interval.  All questions were answered. The patient knows to call the clinic with any problems, questions or concerns. We can certainly see the patient much sooner if necessary.  Disclaimer: This note was dictated with voice recognition software. Similar sounding words can inadvertently be transcribed and may not be corrected upon review.

## 2014-11-20 NOTE — Progress Notes (Signed)
BMS [id number] - questionnaires (PROs) - patient into the cancer center for routine visit. Patient was given PROs upon arrival to the cancer center. The patient completed his PROs (EQ-[id number] first and then FACT-L) before any study procedures were performed. I checked the PROs for completeness.  Patient's research blood was drawn 08/28/2014.  The patient was thanked for his continued support of this clinical trial. Barb Josiyah Tozzi 11/20/2014 8:26 AM

## 2014-11-21 NOTE — Progress Notes (Signed)
11/20/14 0845  BMS 370 cycle 13, week 24 Group D, Arm A, erlotinib only Patient came in to clinic with his wife. PROs were obtained.  Labs were drawn. Vital signs and pulse oximetry were obtained at rest. Patient reports that he started the Nizoral cream for his scrotal rash, (see telephone note from 11/17/14) and that it is much better. The acne-like rash on his face, head, and chest are much improved, although still present. He still complains of slight itching, but denies scalp pain or other pain. He reports SOB only with exertion. He complains of fatigue and says it has not gotten worse. He denies diarrhea, constipation or any other changes since his last visit.  Patient reports that he has not missed taking any study drug. Based on lab results review and physical exam, patient was cleared to continue treatment by Dr. [name]. Patient understands to call for any questions or concerns. Marcellus Scott, RN Clinical Research

## 2014-11-25 ENCOUNTER — Encounter (HOSPITAL_COMMUNITY): Payer: Self-pay

## 2014-11-25 ENCOUNTER — Ambulatory Visit (HOSPITAL_COMMUNITY)
Admission: RE | Admit: 2014-11-25 | Discharge: 2014-11-25 | Disposition: A | Discharge status: Home / Self Care | Payer: Medicare Other | Source: Ambulatory Visit | Attending: Physician Assistant | Admitting: Physician Assistant

## 2014-11-25 DIAGNOSIS — K8020 Calculus of gallbladder without cholecystitis without obstruction: Secondary | ICD-10-CM | POA: Diagnosis not present

## 2014-11-25 DIAGNOSIS — Z79899 Other long term (current) drug therapy: Secondary | ICD-10-CM | POA: Insufficient documentation

## 2014-11-25 DIAGNOSIS — C3431 Malignant neoplasm of lower lobe, right bronchus or lung: Secondary | ICD-10-CM | POA: Diagnosis not present

## 2014-11-25 MED ORDER — IOHEXOL 300 MG/ML  SOLN
100.0000 mL | Freq: Once | INTRAMUSCULAR | Status: AC | PRN
  Administered 2014-11-25: 100 mL via INTRAVENOUS

## 2014-12-02 ENCOUNTER — Ambulatory Visit (INDEPENDENT_AMBULATORY_CARE_PROVIDER_SITE_OTHER): Payer: Medicare Other | Admitting: Thoracic Surgery (Cardiothoracic Vascular Surgery)

## 2014-12-02 ENCOUNTER — Encounter: Payer: Self-pay | Admitting: Thoracic Surgery (Cardiothoracic Vascular Surgery)

## 2014-12-02 VITALS — BP 125/80 | HR 64 | Resp 16 | Ht 72.0 in | Wt 242.0 lb

## 2014-12-02 DIAGNOSIS — C3411 Malignant neoplasm of upper lobe, right bronchus or lung: Secondary | ICD-10-CM | POA: Diagnosis not present

## 2014-12-02 DIAGNOSIS — I2510 Atherosclerotic heart disease of native coronary artery without angina pectoris: Secondary | ICD-10-CM

## 2014-12-02 DIAGNOSIS — Z9889 Other specified postprocedural states: Secondary | ICD-10-CM | POA: Diagnosis not present

## 2014-12-02 DIAGNOSIS — Z902 Acquired absence of lung [part of]: Secondary | ICD-10-CM

## 2014-12-02 NOTE — Progress Notes (Signed)
[address]       [name],[name]             [phone]       HPI:  [name] turns today for scheduled 6 month follow-up visit.  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 He did well for many years until he developed some groundglass nodules in the right lung. These slowly grew over time. The needle biopsy showed adenocarcinoma. We discussed treatment options including surgery, radiation, and chemotherapy. He elected to be treated with Tarceva under a clinical trial and has been on that for about 6 months now.  He feels well. He says he has about 85% of his normal energy. He has had some skin rash, but that has not been a major problem for him. His weight is stable.  Past Medical History  Diagnosis Date  . Hypertension   . Hypercholesterolemia   . Sinus disease     hx of  . History of pneumonia  last 3-4 yrs ago    3 -4 different times  . Hypothyroidism   . Sleep apnea     uses cpap setting opf 12  . History of agent Orange exposure 44-45 yrs ago  . Complication of anesthesia 2007    quit breathing with bronchoscopy, had to spend night  . lung ca dx'd 2007    lung right      Current Outpatient Prescriptions  Medication Sig Dispense Refill  . amitriptyline (ELAVIL) 50 MG tablet Take 50 mg by mouth at bedtime.    [name] aspirin 81 MG tablet Take 81 mg by mouth every morning.     . cholecalciferol (VITAMIN D) 1000 UNITS tablet Take 2,000 Units by mouth daily.     . clindamycin (CLEOCIN-T) 1 % external solution Apply topically 2 (two) times daily. 240 mL 2  . diltiazem (CARDIZEM CD) 360 MG 24 hr capsule Take 360 mg by mouth every morning.     . erlotinib (TARCEVA) 100 MG tablet Take 1 tablet (100 mg total) by mouth daily. Take on an empty stomach 1 hour before meals or 2 hours after.    . fluticasone (CUTIVATE) 0.05 % cream Apply 1 application topically.    [name] ketoconazole (NIZORAL) 2 %  cream Apply 1 application topically daily.    [name] levETIRAcetam (KEPPRA) 750 MG tablet     . levothyroxine (SYNTHROID, LEVOTHROID) 75 MCG tablet     . metoCLOPramide (REGLAN) 10 MG tablet Take 10 mg by mouth every 6 (six) hours as needed for nausea.     . metoprolol succinate (TOPROL-XL) 25 MG 24 hr tablet Take 25 mg by mouth every morning.     . Multiple Vitamins-Minerals (CENTRUM SILVER PO) Take 1 tablet by mouth daily.     . naproxen sodium (ANAPROX) 550 MG tablet Take 550 mg by mouth 3 (three) times daily as needed for moderate pain.     . nitroGLYCERIN (NITROSTAT) 0.4 MG SL tablet Place 0.4 mg under the tongue every 5 (five) minutes as needed for chest pain.     . polyethylene glycol (MIRALAX / GLYCOLAX) packet Take 17 g by mouth daily.    . rosuvastatin (CRESTOR) 5 MG tablet Take 5 mg by mouth every morning.     . Terbinafine HCl (LAMISIL AT JOCK ITCH EX) Apply 1 drop topically. Apply as directed to nails. Mix with DMSO 30 ml    .  valsartan-hydrochlorothiazide (DIOVAN-HCT) 320-25 MG per tablet Take 1 tablet by mouth every morning.     . psyllium (METAMUCIL) 58.6 % powder Take 1 packet by mouth 3 (three) times daily.     No current facility-administered medications for this visit.    Physical Exam BP 125/80 mmHg  Pulse 64  Resp 16  Ht 6' (1.829 m)  Wt 242 lb (109.77 kg)  BMI 32.81 kg/m2  SpO50 21% 69 year old male in no acute distress Well-developed and well-nourished Alert and oriented 3 with no focal deficits Lungs diminished breath sounds right base, otherwise clear No cervical or superficial adenopathy  Diagnostic Tests: CT chest from 11/25/2014 reviewed. Overall the nodules appear stable. There is one that measured slightly larger from the previous scan, but is smaller than it was back in February.  Impression: 69 year old man with adenocarcinoma of the lung being treated with Tarceva. He is tolerating treatment well. His disease appears to be stable for the most  part.  He sees Dr. [name] on Thursday. They will review the scans and decide on the treatment plan.  Plan: I will see [name] back in 6 months  Melrose Nakayama, MD [facility] [phone]

## 2014-12-03 ENCOUNTER — Other Ambulatory Visit: Payer: Self-pay | Admitting: Internal Medicine

## 2014-12-04 ENCOUNTER — Telehealth: Payer: Self-pay | Admitting: Internal Medicine

## 2014-12-04 ENCOUNTER — Encounter: Payer: Self-pay | Admitting: Internal Medicine

## 2014-12-04 ENCOUNTER — Other Ambulatory Visit (HOSPITAL_BASED_OUTPATIENT_CLINIC_OR_DEPARTMENT_OTHER): Payer: Medicare Other

## 2014-12-04 ENCOUNTER — Encounter: Payer: Self-pay | Admitting: *Deleted

## 2014-12-04 ENCOUNTER — Other Ambulatory Visit (HOSPITAL_COMMUNITY)
Admission: RE | Admit: 2014-12-04 | Discharge: 2014-12-04 | Disposition: A | Discharge status: Home / Self Care | Payer: Medicare Other | Source: Ambulatory Visit | Attending: Physician Assistant | Admitting: Physician Assistant

## 2014-12-04 ENCOUNTER — Ambulatory Visit (HOSPITAL_BASED_OUTPATIENT_CLINIC_OR_DEPARTMENT_OTHER): Payer: Medicare Other | Admitting: Internal Medicine

## 2014-12-04 VITALS — BP 124/87 | HR 70 | Temp 97.6°F | Resp 18 | Ht 72.0 in | Wt 242.2 lb

## 2014-12-04 DIAGNOSIS — C3431 Malignant neoplasm of lower lobe, right bronchus or lung: Secondary | ICD-10-CM | POA: Diagnosis present

## 2014-12-04 DIAGNOSIS — Z006 Encounter for examination for normal comparison and control in clinical research program: Secondary | ICD-10-CM | POA: Diagnosis not present

## 2014-12-04 DIAGNOSIS — C3490 Malignant neoplasm of unspecified part of unspecified bronchus or lung: Secondary | ICD-10-CM | POA: Insufficient documentation

## 2014-12-04 DIAGNOSIS — Z5111 Encounter for antineoplastic chemotherapy: Secondary | ICD-10-CM

## 2014-12-04 LAB — CBC WITH DIFFERENTIAL/PLATELET
BASO%: 0.7 % (ref 0.0–2.0)
BASOS ABS: 0 10*3/uL (ref 0.0–0.1)
EOS ABS: 0.2 10*3/uL (ref 0.0–0.5)
EOS%: 3.6 % (ref 0.0–7.0)
HEMATOCRIT: 44.4 % (ref 38.4–49.9)
HGB: 14.9 g/dL (ref 13.0–17.1)
LYMPH#: 2.5 10*3/uL (ref 0.9–3.3)
LYMPH%: 37.2 % (ref 14.0–49.0)
MCH: 33 pg (ref 27.2–33.4)
MCHC: 33.6 g/dL (ref 32.0–36.0)
MCV: 98.1 fL — AB (ref 79.3–98.0)
MONO#: 0.7 10*3/uL (ref 0.1–0.9)
MONO%: 10.4 % (ref 0.0–14.0)
NEUT#: 3.2 10*3/uL (ref 1.5–6.5)
NEUT%: 48.1 % (ref 39.0–75.0)
PLATELETS: 217 10*3/uL (ref 140–400)
RBC: 4.52 10*6/uL (ref 4.20–5.82)
RDW: 13.3 % (ref 11.0–14.6)
WBC: 6.7 10*3/uL (ref 4.0–10.3)

## 2014-12-04 LAB — COMPREHENSIVE METABOLIC PANEL (CC13)
ALT: 31 U/L (ref 0–55)
ANION GAP: 8 meq/L (ref 3–11)
AST: 28 U/L (ref 5–34)
Albumin: 4.2 g/dL (ref 3.5–5.0)
Alkaline Phosphatase: 69 U/L (ref 40–150)
BUN: 17.1 mg/dL (ref 7.0–26.0)
CALCIUM: 9.8 mg/dL (ref 8.4–10.4)
CHLORIDE: 104 meq/L (ref 98–109)
CO2: 29 meq/L (ref 22–29)
CREATININE: 1 mg/dL (ref 0.7–1.3)
EGFR: 77 mL/min/{1.73_m2} — AB (ref >90)
Glucose: 100 mg/dl (ref 70–140)
Potassium: 3.8 mEq/L (ref 3.5–5.1)
Sodium: 141 mEq/L (ref 136–145)
Total Bilirubin: 0.37 mg/dL (ref 0.20–1.20)
Total Protein: 7 g/dL (ref 6.4–8.3)

## 2014-12-04 LAB — LACTATE DEHYDROGENASE (CC13): LDH: 190 U/L (ref 125–245)

## 2014-12-04 LAB — PHOSPHORUS: PHOSPHORUS: 3.6 mg/dL (ref 2.5–4.6)

## 2014-12-04 LAB — MAGNESIUM (CC13): MAGNESIUM: 2.4 mg/dL (ref 1.5–2.5)

## 2014-12-04 NOTE — Telephone Encounter (Signed)
Called patient and he is aware of his appointments

## 2014-12-04 NOTE — Progress Notes (Signed)
[place] Telephone:(336) [phone]   Fax:(336) [phone]  OFFICE PROGRESS NOTE  [name],[name], MD 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 Roxan Hockey on 01/23/2006.  2) Tarceva 150 mg by mouth daily as part of the BMS checkmate 370 clinical trial. Status post 6 weeks of treatment.   CURRENT THERAPY: Tarceva 100 mg by mouth daily as part of the BMS checkmate 370 clinical trial. Status post 6 months of treatment.  DISEASE STAGE: Recurrent  CHEMOTHERAPY INTENT: Palliative.  CURRENT # OF CHEMOTHERAPY CYCLES: 7  CURRENT ANTIEMETICS: None  CURRENT SMOKING STATUS: Non-smoker  ORAL CHEMOTHERAPY AND CONSENT: None  CURRENT BISPHOSPHONATES USE: None  LIVING WILL AND CODE STATUS: Full code.  INTERVAL HISTORY: [name] 69 y.o. male returns to the clinic today for followup visit accompanied by his wife. He is currently on Tarceva 100 mg by mouth daily. He continues to tolerate it fairly well with no specific complaints except for skin rash on the left forearm as well as few spots in the legs developed few days ago. He denied having any significant chest pain, shortness of breath, cough or hemoptysis. The patient denied having any fever or chills, no nausea or vomiting and no diarrhea. He denied having any significant weight loss or night sweats. He had repeat CT scan of the chest, abdomen and pelvis performed recently and he is here for evaluation and discussion of his scan results.  MEDICAL HISTORY: Past Medical History  Diagnosis Date  . Hypertension   . Hypercholesterolemia   . Sinus disease     hx of  . History of pneumonia   last 3-4 yrs ago    3 -4 different times  . Hypothyroidism   . Sleep apnea     uses cpap setting opf 12  . History of agent Orange exposure 44-45 yrs ago  . Complication of anesthesia 2007    quit breathing with bronchoscopy, had to spend night  . lung ca dx'd 2007    lung right    ALLERGIES:  is allergic to lisinopril.  MEDICATIONS:  Current Outpatient Prescriptions  Medication Sig Dispense Refill  . amitriptyline (ELAVIL) 50 MG tablet Take 50 mg by mouth at bedtime.    [name] aspirin 81 MG tablet Take 81 mg by mouth every morning.     . cholecalciferol (VITAMIN D) 1000 UNITS tablet Take 2,000 Units by mouth daily.     . clindamycin (CLEOCIN-T) 1 % external solution Apply topically 2 (two) times daily. 240 mL 2  . diltiazem (CARDIZEM CD) 360 MG 24 hr capsule Take 360 mg by mouth every morning.     . erlotinib (TARCEVA) 100 MG tablet Take 1 tablet (100 mg total) by mouth daily. Take on an empty stomach 1 hour before meals or 2 hours after.    . fluticasone (CUTIVATE) 0.05 % cream Apply 1 application topically.    [name] ketoconazole (NIZORAL) 2 % cream Apply 1 application topically daily.    [name] levETIRAcetam (KEPPRA) 750 MG tablet     . levothyroxine (SYNTHROID, LEVOTHROID) 75 MCG tablet     . metoprolol succinate (TOPROL-XL) 25 MG 24 hr tablet Take 25 mg by  mouth every morning.     . Multiple Vitamins-Minerals (CENTRUM SILVER PO) Take 1 tablet by mouth daily.     . metoCLOPramide (REGLAN) 10 MG tablet Take 10 mg by mouth every 6 (six) hours as needed for nausea.     . naproxen sodium (ANAPROX) 550 MG tablet Take 550 mg by mouth 3 (three) times daily as needed for moderate pain.     . nitroGLYCERIN (NITROSTAT) 0.4 MG SL tablet Place 0.4 mg under the tongue every 5 (five) minutes as needed for chest pain.     . polyethylene glycol (MIRALAX / GLYCOLAX) packet Take 17 g by mouth daily.    . psyllium (METAMUCIL) 58.6 % powder Take 1 packet by mouth 3 (three) times daily.    . rosuvastatin  (CRESTOR) 5 MG tablet Take 5 mg by mouth every morning.     . Terbinafine HCl (LAMISIL AT JOCK ITCH EX) Apply 1 drop topically. Apply as directed to nails. Mix with DMSO 30 ml    . valsartan-hydrochlorothiazide (DIOVAN-HCT) 320-25 MG per tablet Take 1 tablet by mouth every morning.      No current facility-administered medications for this visit.    SURGICAL HISTORY:  Past Surgical History  Procedure Laterality Date  . Cystourethroscopy, gyrus turp.  04/16/2010  . Torn medial and lateral menisci, left knee.  11/06/2006  . Right video-assisted thoracoscopy, right upper lobectomy and  01/23/2006  . The olympus video bronchoscope was introduced via the right  12/21/2005  . Nasal sinus surgery  1987  . Tonsillectomy  1957  . Colonoscopy with propofol N/A 03/19/2013    Procedure: COLONOSCOPY WITH PROPOFOL;  Surgeon: Garlan Fair, MD;  Location: [facility];  Service: Endoscopy;  Laterality: N/A;    REVIEW OF SYSTEMS:  Constitutional: negative Eyes: negative Ears, nose, mouth, throat, and face: negative Respiratory: negative Cardiovascular: negative Gastrointestinal: negative Genitourinary:negative Integument/breast: positive for dryness and rash Hematologic/lymphatic: negative Musculoskeletal:negative Neurological: negative Behavioral/Psych: negative Endocrine: negative Allergic/Immunologic: negative   PHYSICAL EXAMINATION: General appearance: alert, cooperative and no distress Head: Normocephalic, without obvious abnormality, atraumatic Neck: no adenopathy Lymph nodes: Cervical, supraclavicular, and axillary nodes normal. Resp: clear to auscultation bilaterally Back: symmetric, no curvature. ROM normal. No CVA tenderness. Cardio: regular rate and rhythm, S1, S2 normal, no murmur, click, rub or gallop GI: soft, non-tender; bowel sounds normal; no masses,  no organomegaly Extremities: extremities normal, atraumatic, no cyanosis or edema Neurologic: Alert and oriented X 3,  normal strength and tone. Normal symmetric reflexes. Normal coordination and gait  The skin exam showed dried rash in the right inguinal area.   ECOG PERFORMANCE STATUS: 1 - Symptomatic but completely ambulatory  Blood pressure 124/87, pulse 70, temperature 97.6 F (36.4 C), temperature source Oral, resp. rate 18, height 6' (1.829 m), weight 242 lb 3.2 oz (109.861 kg), SpO2 100 %.  LABORATORY DATA: Lab Results  Component Value Date   WBC 6.7 12/04/2014   HGB 14.9 12/04/2014   HCT 44.4 12/04/2014   MCV 98.1* 12/04/2014   PLT 217 12/04/2014      Chemistry      Component Value Date/Time   NA 141 12/04/2014 0923   NA 138 07/17/2014 1000   NA 139 10/20/2011 0903   K 3.8 12/04/2014 0923   K 3.3* 07/17/2014 1000   K 3.4 10/20/2011 0903   CL 107 07/17/2014 1000   CL 102 04/24/2012 0853   CL 101 10/20/2011 0903   CO2 29 12/04/2014 0923   CO2 26 07/17/2014 1000  CO2 29 10/20/2011 0903   BUN 17.1 12/04/2014 0923   BUN 16 07/17/2014 1000   BUN 12 10/20/2011 0903   CREATININE 1.0 12/04/2014 0923   CREATININE 0.82 07/17/2014 1000   CREATININE 0.9 10/20/2011 0903      Component Value Date/Time   CALCIUM 9.8 12/04/2014 0923   CALCIUM 8.9 07/17/2014 1000   CALCIUM 9.0 10/20/2011 0903   ALKPHOS 69 12/04/2014 0923   ALKPHOS 53 07/17/2014 1000   ALKPHOS 52 10/20/2011 0903   AST 28 12/04/2014 0923   AST 24 07/17/2014 1000   AST 24 10/20/2011 0903   ALT 31 12/04/2014 0923   ALT 28 07/17/2014 1000   ALT 31 10/20/2011 0903   BILITOT 0.37 12/04/2014 0923   BILITOT 0.8 07/17/2014 1000   BILITOT 0.60 10/20/2011 0903       RADIOGRAPHIC STUDIES: Ct Chest W Contrast  11/25/2014   CLINICAL DATA:  Non small cell lung cancer. Initial diagnosis 2007. Recurrence January 2016. Ongoing chemotherapy.  EXAM: CT CHEST, ABDOMEN WITH CONTRAST  TECHNIQUE: Multidetector CT imaging of the chest and abdomen was performed following the standard protocol during bolus administration of intravenous  contrast.  CONTRAST:  157m OMNIPAQUE IOHEXOL 300 MG/ML  SOLN  COMPARISON:  CT 09/30/2014, 08/05/2014 and prior PET-CT 05/07/2014  FINDINGS: Recist 1.1  Target lesions:  1. Right lower lobe lesion on image number 23 measures 21 mm. Previous 20 mm. 2. Linear density on image 25 in the right lower lobe measures 16 mm. Previous 11 mm. Non target lesions:  1. 6 mm nodule in the right lower lobe on image 26.  Previous 8 mm.  1. CT CHEST FINDINGS  Chest wall: No chest wall mass, supraclavicular or axillary lymphadenopathy. Small scattered lymph nodes are stable. The thyroid gland is normal. The bony thorax is intact. No destructive bone lesions or spinal canal compromise.  Mediastinum: The heart is normal in size. No pericardial effusion. Small scattered mediastinal and hilar lymph nodes but no mass or adenopathy. The aorta is normal in caliber. No dissection. The esophagus is grossly normal.  Lungs/pleura: Stable mild emphysematous changes and surgical changes involving the right upper lobe. There are linear areas of scarring type change in the right lower lobe. No significant interval change in size or appearance. Stable nodular densities in the right lower lobe anteriorly on image number 26 and 22. These both measure 6 mm and are unchanged. No new pulmonary lesions. No pleural effusion.  CT ABDOMEN  FINDINGS  Hepatobiliary: No focal hepatic lesions or intrahepatic biliary dilatation. The gallbladder demonstrates a stable 25 mm gallstone. No findings for acute cholecystitis. No common bile duct dilatation.  Pancreas: No mass, inflammation or ductal dilatation.  Spleen: Normal size.  No focal lesions.  Adrenals/Urinary Tract: The adrenal glands and kidneys are unremarkable and stable.  Stomach/Bowel: The stomach, duodenum, visualized small bowel and visualized colon are normal. No inflammatory changes, mass lesions or obstructive findings.  Vascular/Lymphatic: No mesenteric or retroperitoneal mass or adenopathy. Small  scattered lymph nodes are noted. The aorta and branch vessels are patent. The major venous structures are patent.  Other: No ascites or subcutaneous lesions. No abdominal wall hernia.  Musculoskeletal: No significant bony findings.  IMPRESSION: 1. Stable surgical changes involving the right hemi thorax. No findings for mediastinal or hilar lymphadenopathy. 2. Stable right lung densities as discussed above. No new pulmonary lesions. No acute pulmonary findings. 3. No findings for upper abdominal metastatic disease. 4. No findings for osseous metastatic disease.  Electronically Signed   By: Marijo Sanes M.D.   On: 11/25/2014 10:02   Ct Abdomen W Contrast  11/25/2014   CLINICAL DATA:  Non small cell lung cancer. Initial diagnosis 2007. Recurrence January 2016. Ongoing chemotherapy.  EXAM: CT CHEST, ABDOMEN WITH CONTRAST  TECHNIQUE: Multidetector CT imaging of the chest and abdomen was performed following the standard protocol during bolus administration of intravenous contrast.  CONTRAST:  155m OMNIPAQUE IOHEXOL 300 MG/ML  SOLN  COMPARISON:  CT 09/30/2014, 08/05/2014 and prior PET-CT 05/07/2014  FINDINGS: Recist 1.1  Target lesions:  1. Right lower lobe lesion on image number 23 measures 21 mm. Previous 20 mm. 2. Linear density on image 25 in the right lower lobe measures 16 mm. Previous 11 mm. Non target lesions:  1. 6 mm nodule in the right lower lobe on image 26.  Previous 8 mm.  1. CT CHEST FINDINGS  Chest wall: No chest wall mass, supraclavicular or axillary lymphadenopathy. Small scattered lymph nodes are stable. The thyroid gland is normal. The bony thorax is intact. No destructive bone lesions or spinal canal compromise.  Mediastinum: The heart is normal in size. No pericardial effusion. Small scattered mediastinal and hilar lymph nodes but no mass or adenopathy. The aorta is normal in caliber. No dissection. The esophagus is grossly normal.  Lungs/pleura: Stable mild emphysematous changes and surgical  changes involving the right upper lobe. There are linear areas of scarring type change in the right lower lobe. No significant interval change in size or appearance. Stable nodular densities in the right lower lobe anteriorly on image number 26 and 22. These both measure 6 mm and are unchanged. No new pulmonary lesions. No pleural effusion.  CT ABDOMEN  FINDINGS  Hepatobiliary: No focal hepatic lesions or intrahepatic biliary dilatation. The gallbladder demonstrates a stable 25 mm gallstone. No findings for acute cholecystitis. No common bile duct dilatation.  Pancreas: No mass, inflammation or ductal dilatation.  Spleen: Normal size.  No focal lesions.  Adrenals/Urinary Tract: The adrenal glands and kidneys are unremarkable and stable.  Stomach/Bowel: The stomach, duodenum, visualized small bowel and visualized colon are normal. No inflammatory changes, mass lesions or obstructive findings.  Vascular/Lymphatic: No mesenteric or retroperitoneal mass or adenopathy. Small scattered lymph nodes are noted. The aorta and branch vessels are patent. The major venous structures are patent.  Other: No ascites or subcutaneous lesions. No abdominal wall hernia.  Musculoskeletal: No significant bony findings.  IMPRESSION: 1. Stable surgical changes involving the right hemi thorax. No findings for mediastinal or hilar lymphadenopathy. 2. Stable right lung densities as discussed above. No new pulmonary lesions. No acute pulmonary findings. 3. No findings for upper abdominal metastatic disease. 4. No findings for osseous metastatic disease.   Electronically Signed   By: PMarijo SanesM.D.   On: 11/25/2014 10:02   ASSESSMENT AND PLAN: This is a very pleasant 69years old white male with recurrent non-small cell lung cancer, adenocarcinoma with positive EGFR mutation in exon 21 that was initially diagnosed as stage IA non-small cell lung cancer status post right upper lobectomy and has been observation since October of 2007. He  is status post 6 weeks treatment with Tarceva 150 mg by mouth daily according to the BMS checkmate 370 clinical trial. He is currently on Tarceva 100 mg by mouth daily for the last 6 months and tolerating it fairly well.  I recommended for the patient to continue his current treatment with Tarceva 100 mg by mouth daily. The recent CT  scan of the chest showed mild increase in some of the pulmonary nodules but not significant enough to consider for disease progression. I discussed the scan results with the patient and his wife. I recommended for him to continue his current treatment with Tarceva with the same dose. For the skin rash, he will apply hydrocortisone cream to these areas. He would come back for follow-up visit in 2 weeks for reevaluation and management of any adverse effects.   The patient was advised to call immediately if he has any other concerning symptoms in the interval.  All questions were answered. The patient knows to call the clinic with any problems, questions or concerns. We can certainly see the patient much sooner if necessary.  Disclaimer: This note was dictated with voice recognition software. Similar sounding words can inadvertently be transcribed and may not be corrected upon review.

## 2014-12-04 NOTE — Progress Notes (Signed)
12/04/14 0945 BMS 370 cycle 14, week 26 Group D, Arm A, erlotinib only Patient came in to the clinic with his wife.  Labs were drawn. Vital signs and pulse oximetry were obtained at rest. Patient reports that he started the Lamisil cream for his right  toenail infection on 11/20/14 and reports it is better. He also reports his groin area fungal infection is better. The  rash on his face, head, and chest is now also on his left forearm and bilateral legs.  He reports it itches, but denies any pain.  He reports fatigue at times and has SOB only on exertion.  He denies any other new changes aside from the spread of his rash, which makes it a grade 2.   Dr. [name] was in to review the latest CT scan results with patient, confirming stable disease.  Per MD, proceed with current treatment plan.  The patient was in agreement with the plan.    Patient reports that he has not missed taking any study drug. Based on lab results review and physical exam by Dr. [name], patient was cleared by MD to continue treatment. Patient understands to call for any questions or concerns. Marcellus Scott, RN Clinical Research

## 2014-12-11 ENCOUNTER — Telehealth: Payer: Self-pay | Admitting: *Deleted

## 2014-12-11 NOTE — Telephone Encounter (Signed)
12/11/2014 1218 The patient phoned this RN stating that his dry rash between groin areas has spread to bilateral arms and legs since last week's visit.  He stated he had been applying  hydrocortisone cream to these areas, but that it is not better.  "It is driving me crazy" he reported.  He denies rash on face, head, or neck.  He is requesting something for this.  Dr. [name] and Abelina Bachelor, RN notified and will address patient's complaints.

## 2014-12-12 ENCOUNTER — Other Ambulatory Visit: Payer: Self-pay | Admitting: Medical Oncology

## 2014-12-12 ENCOUNTER — Ambulatory Visit (HOSPITAL_BASED_OUTPATIENT_CLINIC_OR_DEPARTMENT_OTHER): Payer: Medicare Other | Admitting: Nurse Practitioner

## 2014-12-12 ENCOUNTER — Encounter: Payer: Self-pay | Admitting: *Deleted

## 2014-12-12 VITALS — BP 126/83 | HR 76 | Temp 98.2°F | Resp 18 | Ht 72.0 in | Wt 244.7 lb

## 2014-12-12 DIAGNOSIS — R21 Rash and other nonspecific skin eruption: Secondary | ICD-10-CM | POA: Diagnosis not present

## 2014-12-12 DIAGNOSIS — C3431 Malignant neoplasm of lower lobe, right bronchus or lung: Secondary | ICD-10-CM | POA: Diagnosis not present

## 2014-12-12 MED ORDER — DOXYCYCLINE HYCLATE 100 MG PO TABS: 100.0000 mg | ORAL_TABLET | Freq: Two times a day (BID) | ORAL | Status: DC

## 2014-12-12 NOTE — Progress Notes (Signed)
12/12/2014 0850 Received message from Dr. [name] to have patient come in today and have rash evaluated by C. Berniece Salines, NP. Appointment was made and the patient was notified. Marcellus Scott, RN  12/12/2014 1110  BMS 370 Group D, Arm A-erlotinib only Patient arrived at clinic alone for assessment of his rash.  Resting vital signs were taken. The patient reports the rash on his bilateral arms and groins is the main complaint.  The rash has worsened since seen last week.  It has spread from his left forearm to his whole left arm and right arm.  It is on bilateral groins, not his lower legs.  He complains of itching and wanting to scratch it.  He continues to take erlotinib daily as prescribed.  He was seen and evaluated by C. Berniece Salines, NP.

## 2014-12-14 ENCOUNTER — Encounter: Payer: Self-pay | Admitting: Nurse Practitioner

## 2014-12-14 DIAGNOSIS — R21 Rash and other nonspecific skin eruption: Secondary | ICD-10-CM | POA: Insufficient documentation

## 2014-12-14 NOTE — Progress Notes (Signed)
SYMPTOM MANAGEMENT CLINIC   HPI: [name] 69 y.o. male diagnosed with lung cancer.  Currently undergoing Tarceva oral therapy.   Pt has had generalized pustular rash to his chest, back, and various areas for several weeks most likely secondary to Tarceva.  Pt has been using the hydrcortisone cream to rash with only slight improvement. He has also been applying Clelcin solution to the rash.   Pt now reports increasing rash to bilat arms and groin; that is pruritic.    HPI  ROS  Past Medical History  Diagnosis Date  . Hypertension   . Hypercholesterolemia   . Sinus disease     hx of  . History of pneumonia  last 3-4 yrs ago    3 -4 different times  . Hypothyroidism   . Sleep apnea     uses cpap setting opf 12  . History of agent Orange exposure 44-45 yrs ago  . Complication of anesthesia 2007    quit breathing with bronchoscopy, had to spend night  . lung ca dx'd 2007    lung right    Past Surgical History  Procedure Laterality Date  . Cystourethroscopy, gyrus turp.  04/16/2010  . Torn medial and lateral menisci, left knee.  11/06/2006  . Right video-assisted thoracoscopy, right upper lobectomy and  01/23/2006  . The olympus video bronchoscope was introduced via the right  12/21/2005  . Nasal sinus surgery  1987  . Tonsillectomy  1957  . Colonoscopy with propofol N/A 03/19/2013    Procedure: COLONOSCOPY WITH PROPOFOL;  Surgeon: Garlan Fair, MD;  Location: [facility];  Service: Endoscopy;  Laterality: N/A;    has Sinus disease; Sleep apnea; History of pneumonia; History of degenerative joint disease; Lung cancer; Cough with hemoptysis; Malignant neoplasm of lower lobe of right lung; Encounter for antineoplastic chemotherapy; and Rash on his problem list.    is allergic to lisinopril.    Medication List       This list is accurate as of: 12/12/14 11:59 PM.  Always use your most recent med list.               amitriptyline 50 MG tablet  Commonly  known as:  ELAVIL  Take 50 mg by mouth at bedtime.     aspirin 81 MG tablet  Take 81 mg by mouth every morning.     CENTRUM SILVER PO  Take 1 tablet by mouth daily.     cholecalciferol 1000 UNITS tablet  Commonly known as:  VITAMIN D  Take 2,000 Units by mouth daily.     clindamycin 1 % external solution  Commonly known as:  CLEOCIN-T  Apply topically 2 (two) times daily.     diltiazem 360 MG 24 hr capsule  Commonly known as:  CARDIZEM CD  Take 360 mg by mouth every morning.     doxycycline 100 MG tablet  Commonly known as:  VIBRA-TABS  Take 1 tablet (100 mg total) by mouth 2 (two) times daily.     erlotinib 100 MG tablet  Commonly known as:  TARCEVA  Take 1 tablet (100 mg total) by mouth daily. Take on an empty stomach 1 hour before meals or 2 hours after.     fluticasone 0.05 % cream  Commonly known as:  CUTIVATE  Apply 1 application topically.     ketoconazole 2 % cream  Commonly known as:  NIZORAL  Apply 1 application topically daily.     [facility]  Apply 1 drop topically. Apply as directed to nails. Mix with DMSO 30 ml     levETIRAcetam 750 MG tablet  Commonly known as:  KEPPRA     levothyroxine 75 MCG tablet  Commonly known as:  SYNTHROID, LEVOTHROID     metoCLOPramide 10 MG tablet  Commonly known as:  REGLAN  Take 10 mg by mouth every 6 (six) hours as needed for nausea.     metoprolol succinate 25 MG 24 hr tablet  Commonly known as:  TOPROL-XL  Take 25 mg by mouth every morning.     naproxen sodium 550 MG tablet  Commonly known as:  ANAPROX  Take 550 mg by mouth 3 (three) times daily as needed for moderate pain.     nitroGLYCERIN 0.4 MG SL tablet  Commonly known as:  NITROSTAT  Place 0.4 mg under the tongue every 5 (five) minutes as needed for chest pain.     polyethylene glycol packet  Commonly known as:  MIRALAX / GLYCOLAX  Take 17 g by mouth daily.     psyllium 58.6 % powder  Commonly known as:  METAMUCIL  Take 1 packet by  mouth 3 (three) times daily.     rosuvastatin 5 MG tablet  Commonly known as:  CRESTOR  Take 5 mg by mouth every morning.     valsartan-hydrochlorothiazide 320-25 MG per tablet  Commonly known as:  DIOVAN-HCT  Take 1 tablet by mouth every morning.         PHYSICAL EXAMINATION  Oncology Vitals 12/12/2014 12/04/2014 12/02/2014 11/20/2014 11/06/2014 10/23/2014 10/09/2014  Height 183 cm 183 cm 183 cm 183 cm 183 cm 183 cm 183 cm  Weight 110.995 kg 109.861 kg 109.77 kg 110.269 kg 108.954 kg 109.997 kg 110.995 kg  Weight (lbs) 244 lbs 11 oz 242 lbs 3 oz 242 lbs 243 lbs 2 oz 240 lbs 3 oz 242 lbs 8 oz 244 lbs 11 oz  BMI (kg/m2) 33.19 kg/m2 32.85 kg/m2 32.82 kg/m2 32.97 kg/m2 32.58 kg/m2 32.89 kg/m2 33.19 kg/m2  Temp 98.2 97.6 - 97.9 97.7 97.8 98  Pulse 76 70 64 64 70 73 66  Resp '18 18 16 18 18 16 18  '$ Resp (Historical as of 11/03/11) - - - - - - -  SpO2 97 100 96 98 98 99 97  BSA (m2) 2.37 m2 2.36 m2 2.36 m2 2.37 m2 2.35 m2 2.36 m2 2.37 m2   BP Readings from Last 3 Encounters:  12/12/14 126/83  12/04/14 124/87  12/02/14 125/80    Physical Exam  Constitutional: He is oriented to person, place, and time and well-developed, well-nourished, and in no distress.  HENT:  Head: Normocephalic and atraumatic.  Eyes: Conjunctivae and EOM are normal. Pupils are equal, round, and reactive to light. Right eye exhibits no discharge. Left eye exhibits no discharge. No scleral icterus.  Neck: Normal range of motion.  Pulmonary/Chest: Effort normal. No respiratory distress.  Musculoskeletal: Normal range of motion.  Neurological: He is alert and oriented to person, place, and time. Gait normal.  Skin: Skin is warm and dry. Rash noted. There is erythema. No pallor.  On exam- pt with pustular rash to chest; and fine red/pink rash that almost appears to be hives to right forearm.      Psychiatric: Affect normal.  Nursing note and vitals reviewed.   LABORATORY DATA:. No visits with results within 3 Day(s)  from this visit. Latest known visit with results is:  Hospital Outpatient Visit on 12/04/2014  Component Date  Value Ref Range Status  . Phosphorus 12/04/2014 3.6  2.5 - 4.6 mg/dL Final   Right arm:      RADIOGRAPHIC STUDIES: No results found.  ASSESSMENT/PLAN:    Rash Pt has had generalized pustular rash to his chest, back, and various areas for several weeks most likely secondary to Tarceva.  Pt has been using the hydrcortisone cream to rash with only slight improvement. He has also been applying Clelcin solution to the rash.   Pt now reports increasing rash to bilat arms and groin; that is pruritic.    On exam- pt with pustular rash to chest; and fine red/pink rash that almost appears to be hives to right forearm.   According to Tarceva recommendations for rash- advised pt to continue same dose of Tarceva.  Pt was also advised to continue with the cleocin solution- but pt requests that he switch to either cleocin lotion or gel since it is easier to apply.  Also- will prescribe doxycycline to see if that helps with rash.   Local pharmacy advised only able to obtain cleocin gel right now- no cleocin lotion in stock. Pt aware of this; and will try the gel first.      Malignant neoplasm of lower lobe of right lung Pt continues to take Tarceva oral therapy at reduced dose of 100 mg daily.  His rash has increased within last few days.  He denies any other new symptoms.   Pt is scheduled to return for labs and visit on 12/18/14.    Patient stated understanding of all instructions; and was in agreement with this plan of care. The patient knows to call the clinic with any problems, questions or concerns.   Review/collaboration with Dr. [name] regarding all aspects of patient's visit today.   Total time spent with patient was 25 minutes;  with greater than 75 percent of that time spent in face to face counseling regarding patient's symptoms,  and coordination of care and follow  up.  Disclaimer:This dictation was prepared with Dragon/digital dictation along with [employer]. Any transcriptional errors that result from this process are unintentional.  Drue Second, NP 12/14/2014

## 2014-12-14 NOTE — Assessment & Plan Note (Signed)
Pt continues to take Tarceva oral therapy at reduced dose of 100 mg daily.  His rash has increased within last few days.  He denies any other new symptoms.   Pt is scheduled to return for labs and visit on 12/18/14.

## 2014-12-14 NOTE — Assessment & Plan Note (Addendum)
Pt has had generalized pustular rash to his chest, back, and various areas for several weeks most likely secondary to Tarceva.  Pt has been using the hydrcortisone cream to rash with only slight improvement. He has also been applying Clelcin solution to the rash.   Pt now reports increasing rash to bilat arms and groin; that is pruritic.    On exam- pt with pustular rash to chest; and fine red/pink rash that almost appears to be hives to right forearm.   According to Tarceva recommendations for rash- advised pt to continue same dose of Tarceva.  Pt was also advised to continue with the cleocin solution- but pt requests that he switch to either cleocin lotion or gel since it is easier to apply.  Also- will prescribe doxycycline to see if that helps with rash.   Local pharmacy advised only able to obtain cleocin gel right now- no cleocin lotion in stock. Pt aware of this; and will try the gel first.

## 2014-12-15 ENCOUNTER — Telehealth: Payer: Self-pay

## 2014-12-15 NOTE — Telephone Encounter (Signed)
Pt states his arms and rash is "considerably better". They still itch but are improving. Pt asked about receiving flu shot and he was instructed to ask Dr [name] this week during his upcoming appt.

## 2014-12-17 ENCOUNTER — Encounter: Payer: Self-pay | Admitting: *Deleted

## 2014-12-17 ENCOUNTER — Other Ambulatory Visit: Payer: Self-pay | Admitting: Internal Medicine

## 2014-12-17 DIAGNOSIS — C3431 Malignant neoplasm of lower lobe, right bronchus or lung: Secondary | ICD-10-CM

## 2014-12-17 MED ORDER — ERLOTINIB HCL 100 MG PO TABS: 100.0000 mg | ORAL_TABLET | Freq: Every day | ORAL | Status: DC

## 2014-12-18 ENCOUNTER — Ambulatory Visit (HOSPITAL_BASED_OUTPATIENT_CLINIC_OR_DEPARTMENT_OTHER): Payer: Medicare Other | Admitting: Physician Assistant

## 2014-12-18 ENCOUNTER — Encounter: Payer: Self-pay | Admitting: Physician Assistant

## 2014-12-18 ENCOUNTER — Ambulatory Visit (HOSPITAL_BASED_OUTPATIENT_CLINIC_OR_DEPARTMENT_OTHER): Payer: Medicare Other

## 2014-12-18 ENCOUNTER — Other Ambulatory Visit (HOSPITAL_BASED_OUTPATIENT_CLINIC_OR_DEPARTMENT_OTHER): Payer: Medicare Other

## 2014-12-18 ENCOUNTER — Other Ambulatory Visit (HOSPITAL_COMMUNITY)
Admission: RE | Admit: 2014-12-18 | Discharge: 2014-12-18 | Disposition: A | Discharge status: Home / Self Care | Payer: Medicare Other | Source: Ambulatory Visit | Attending: Internal Medicine | Admitting: Internal Medicine

## 2014-12-18 ENCOUNTER — Telehealth: Payer: Self-pay | Admitting: Internal Medicine

## 2014-12-18 VITALS — BP 129/78 | HR 60 | Temp 98.1°F | Resp 18 | Ht 72.0 in | Wt 241.0 lb

## 2014-12-18 DIAGNOSIS — C3431 Malignant neoplasm of lower lobe, right bronchus or lung: Secondary | ICD-10-CM | POA: Diagnosis not present

## 2014-12-18 DIAGNOSIS — Z79899 Other long term (current) drug therapy: Secondary | ICD-10-CM | POA: Diagnosis not present

## 2014-12-18 DIAGNOSIS — Z006 Encounter for examination for normal comparison and control in clinical research program: Secondary | ICD-10-CM

## 2014-12-18 DIAGNOSIS — Z23 Encounter for immunization: Secondary | ICD-10-CM

## 2014-12-18 LAB — TSH CHCC: TSH: 4.801 m(IU)/L — ABNORMAL HIGH (ref 0.320–4.118)

## 2014-12-18 LAB — CBC WITH DIFFERENTIAL/PLATELET
BASO%: 0.7 % (ref 0.0–2.0)
BASOS ABS: 0 10*3/uL (ref 0.0–0.1)
EOS ABS: 0.3 10*3/uL (ref 0.0–0.5)
EOS%: 5.5 % (ref 0.0–7.0)
HCT: 41.3 % (ref 38.4–49.9)
HEMOGLOBIN: 14 g/dL (ref 13.0–17.1)
LYMPH%: 38.8 % (ref 14.0–49.0)
MCH: 33 pg (ref 27.2–33.4)
MCHC: 33.8 g/dL (ref 32.0–36.0)
MCV: 97.7 fL (ref 79.3–98.0)
MONO#: 0.6 10*3/uL (ref 0.1–0.9)
MONO%: 10 % (ref 0.0–14.0)
NEUT#: 2.6 10*3/uL (ref 1.5–6.5)
NEUT%: 45 % (ref 39.0–75.0)
Platelets: 197 10*3/uL (ref 140–400)
RBC: 4.22 10*6/uL (ref 4.20–5.82)
RDW: 13.2 % (ref 11.0–14.6)
WBC: 5.9 10*3/uL (ref 4.0–10.3)
lymph#: 2.3 10*3/uL (ref 0.9–3.3)

## 2014-12-18 LAB — COMPREHENSIVE METABOLIC PANEL (CC13)
ALBUMIN: 4 g/dL (ref 3.5–5.0)
ALK PHOS: 61 U/L (ref 40–150)
ALT: 28 U/L (ref 0–55)
AST: 28 U/L (ref 5–34)
Anion Gap: 6 mEq/L (ref 3–11)
BUN: 17.6 mg/dL (ref 7.0–26.0)
CHLORIDE: 105 meq/L (ref 98–109)
CO2: 29 mEq/L (ref 22–29)
Calcium: 9.5 mg/dL (ref 8.4–10.4)
Creatinine: 1 mg/dL (ref 0.7–1.3)
EGFR: 81 mL/min/{1.73_m2} — AB (ref >90)
GLUCOSE: 95 mg/dL (ref 70–140)
POTASSIUM: 3.8 meq/L (ref 3.5–5.1)
SODIUM: 141 meq/L (ref 136–145)
Total Bilirubin: 0.36 mg/dL (ref 0.20–1.20)
Total Protein: 6.5 g/dL (ref 6.4–8.3)

## 2014-12-18 LAB — LACTATE DEHYDROGENASE (CC13): LDH: 204 U/L (ref 125–245)

## 2014-12-18 LAB — MAGNESIUM (CC13): MAGNESIUM: 2.4 mg/dL (ref 1.5–2.5)

## 2014-12-18 LAB — PHOSPHORUS: PHOSPHORUS: 3.2 mg/dL (ref 2.5–4.6)

## 2014-12-18 MED ORDER — INFLUENZA VAC SPLIT QUAD 0.5 ML IM SUSY
0.5000 mL | PREFILLED_SYRINGE | Freq: Once | INTRAMUSCULAR | Status: AC
  Administered 2014-12-18: 0.5 mL via INTRAMUSCULAR
  Filled 2014-12-18: qty 0.5

## 2014-12-18 NOTE — Progress Notes (Signed)
12/18/2014 1010  BMS 370 cycle 15, week 28 The patient comes to clinic today accompanied by his wife. He had resting vital signs and O2 sat. taken.  He states the rash he had on bilat. arms last week is much improved.  It still itches some, but is decreased in size and in area it was spread.  He still has a rash on bilateral arms, head, neck, and groins, but it is much improved. The patient continues to take oral doxycycline and uses the cleocin topical on rash. He still remains very active walking and only gets occasional dyspnea on exertion.  He has mild fatigue. He reports the toe nail area is getting better as well as the "jock itch" in his groins, but not completely gone away. He denies any other problems or complaints, including diarrhea, constipation, changes in breathing.  He denies pain, headache, neuro or vision problems. Based on physical exam and lab results review by Awilda Metro, PA, and Dr. [name], the patient was cleared to continue treatment with erlotinib 100 mg daily. The patient denied questions.

## 2014-12-18 NOTE — Telephone Encounter (Signed)
Gave and pritned appt sched and avs for pt for SEpt thru [facility]

## 2014-12-18 NOTE — Progress Notes (Addendum)
[place] [phone]   [phone]  OFFICE PROGRESS NOTE  [name],[name], MD 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 Roxan Hockey on 01/23/2006.  2) Tarceva 150 mg by mouth daily as part of the BMS checkmate 370 clinical trial. Status post 6 weeks of treatment.   CURRENT THERAPY: Tarceva 100 mg by mouth daily as part of the BMS checkmate 370 clinical trial. Status post 7 months of treatment.  DISEASE STAGE: Recurrent  CHEMOTHERAPY INTENT: Palliative.  CURRENT # OF CHEMOTHERAPY CYCLES: 8  CURRENT ANTIEMETICS: None  CURRENT SMOKING STATUS: Non-smoker  ORAL CHEMOTHERAPY AND CONSENT: None  CURRENT BISPHOSPHONATES USE: None  LIVING WILL AND CODE STATUS: Full code.  INTERVAL HISTORY: [name] 69 y.o. male returns to the clinic today for followup visit accompanied by his wife. He is currently on Tarceva 100 mg by mouth daily. He continues to tolerate it fairly well with no specific complaints except for skin rash on the left forearm as well as few spots in the legs developed few days ago. He denied having any significant chest pain, shortness of breath, cough or hemoptysis. The patient denied having any fever or chills, no nausea or vomiting and no diarrhea. He denied having any significant weight loss or night sweats.   MEDICAL HISTORY: Past Medical History  Diagnosis Date  . Hypertension   . Hypercholesterolemia   . Sinus disease     hx of  . History of pneumonia  last 3-4 yrs ago    3 -4 different times  . Hypothyroidism   . Sleep apnea     uses cpap setting opf 12  . History of agent Orange  exposure 44-45 yrs ago  . Complication of anesthesia 2007    quit breathing with bronchoscopy, had to spend night  . lung ca dx'd 2007    lung right    ALLERGIES:  is allergic to lisinopril.  MEDICATIONS:  Current Outpatient Prescriptions  Medication Sig Dispense Refill  . amitriptyline (ELAVIL) 50 MG tablet Take 50 mg by mouth at bedtime.    [name] aspirin 81 MG tablet Take 81 mg by mouth every morning.     . cholecalciferol (VITAMIN D) 1000 UNITS tablet Take 2,000 Units by mouth daily.     . clindamycin (CLEOCIN-T) 1 % external solution Apply topically 2 (two) times daily. 240 mL 2  . diltiazem (CARDIZEM CD) 360 MG 24 hr capsule Take 360 mg by mouth every morning.     [name] doxycycline (VIBRA-TABS) 100 MG tablet Take 1 tablet (100 mg total) by mouth 2 (two) times daily. 28 tablet 0  . erlotinib (TARCEVA) 100 MG tablet Take 1 tablet (100 mg total) by mouth daily. Take on an empty stomach 1 hour before meals or 2 hours after.    . fluticasone (CUTIVATE) 0.05 % cream Apply 1 application topically.    [name] ketoconazole (NIZORAL) 2 % cream Apply 1 application topically daily.    [name] levETIRAcetam (KEPPRA) 750 MG tablet     . levothyroxine (SYNTHROID, LEVOTHROID) 75 MCG tablet     . metoCLOPramide (REGLAN) 10 MG tablet Take 10 mg by mouth every 6 (six) hours  as needed for nausea.     . metoprolol succinate (TOPROL-XL) 25 MG 24 hr tablet Take 25 mg by mouth every morning.     . Multiple Vitamins-Minerals (CENTRUM SILVER PO) Take 1 tablet by mouth daily.     . naproxen sodium (ANAPROX) 550 MG tablet Take 550 mg by mouth 3 (three) times daily as needed for moderate pain.     . nitroGLYCERIN (NITROSTAT) 0.4 MG SL tablet Place 0.4 mg under the tongue every 5 (five) minutes as needed for chest pain.     . polyethylene glycol (MIRALAX / GLYCOLAX) packet Take 17 g by mouth daily.    . psyllium (METAMUCIL) 58.6 % powder Take 1 packet by mouth 3 (three) times daily.    . rosuvastatin (CRESTOR) 5 MG tablet Take 5  mg by mouth every morning.     . Terbinafine HCl (LAMISIL AT JOCK ITCH EX) Apply 1 drop topically. Apply as directed to nails. Mix with DMSO 30 ml    . valsartan-hydrochlorothiazide (DIOVAN-HCT) 320-25 MG per tablet Take 1 tablet by mouth every morning.      No current facility-administered medications for this visit.    SURGICAL HISTORY:  Past Surgical History  Procedure Laterality Date  . Cystourethroscopy, gyrus turp.  04/16/2010  . Torn medial and lateral menisci, left knee.  11/06/2006  . Right video-assisted thoracoscopy, right upper lobectomy and  01/23/2006  . The olympus video bronchoscope was introduced via the right  12/21/2005  . Nasal sinus surgery  1987  . Tonsillectomy  1957  . Colonoscopy with propofol N/A 03/19/2013    Procedure: COLONOSCOPY WITH PROPOFOL;  Surgeon: Garlan Fair, MD;  Location: [facility];  Service: Endoscopy;  Laterality: N/A;    REVIEW OF SYSTEMS:  Constitutional: negative Eyes: negative Ears, nose, mouth, throat, and face: negative Respiratory: negative Cardiovascular: negative Gastrointestinal: negative Genitourinary:negative Integument/breast: positive for dryness and rash Hematologic/lymphatic: negative Musculoskeletal:negative Neurological: negative Behavioral/Psych: negative Endocrine: negative Allergic/Immunologic: negative   PHYSICAL EXAMINATION: General appearance: alert, cooperative and no distress Head: Normocephalic, without obvious abnormality, atraumatic Neck: no adenopathy Lymph nodes: Cervical, supraclavicular, and axillary nodes normal. Resp: clear to auscultation bilaterally Back: symmetric, no curvature. ROM normal. No CVA tenderness. Cardio: regular rate and rhythm, S1, S2 normal, no murmur, click, rub or gallop GI: soft, non-tender; bowel sounds normal; no masses,  no organomegaly Extremities: extremities normal, atraumatic, no cyanosis or edema Neurologic: Alert and oriented X 3, normal strength and tone.  Normal symmetric reflexes. Normal coordination and gait  The skin exam showed dried rash in the right inguinal area.   ECOG PERFORMANCE STATUS: 1 - Symptomatic but completely ambulatory  Blood pressure 129/78, pulse 60, temperature 98.1 F (36.7 C), temperature source Oral, resp. rate 18, height 6' (1.829 m), weight 241 lb (109.317 kg), SpO2 99 %.  LABORATORY DATA: Lab Results  Component Value Date   WBC 5.9 12/18/2014   HGB 14.0 12/18/2014   HCT 41.3 12/18/2014   MCV 97.7 12/18/2014   PLT 197 12/18/2014      Chemistry      Component Value Date/Time   NA 141 12/18/2014 0835   NA 138 07/17/2014 1000   NA 139 10/20/2011 0903   K 3.8 12/18/2014 0835   K 3.3* 07/17/2014 1000   K 3.4 10/20/2011 0903   CL 107 07/17/2014 1000   CL 102 04/24/2012 0853   CL 101 10/20/2011 0903   CO2 29 12/18/2014 0835   CO2 26 07/17/2014 1000   CO2 29  10/20/2011 0903   BUN 17.6 12/18/2014 0835   BUN 16 07/17/2014 1000   BUN 12 10/20/2011 0903   CREATININE 1.0 12/18/2014 0835   CREATININE 0.82 07/17/2014 1000   CREATININE 0.9 10/20/2011 0903      Component Value Date/Time   CALCIUM 9.5 12/18/2014 0835   CALCIUM 8.9 07/17/2014 1000   CALCIUM 9.0 10/20/2011 0903   ALKPHOS 61 12/18/2014 0835   ALKPHOS 53 07/17/2014 1000   ALKPHOS 52 10/20/2011 0903   AST 28 12/18/2014 0835   AST 24 07/17/2014 1000   AST 24 10/20/2011 0903   ALT 28 12/18/2014 0835   ALT 28 07/17/2014 1000   ALT 31 10/20/2011 0903   BILITOT 0.36 12/18/2014 0835   BILITOT 0.8 07/17/2014 1000   BILITOT 0.60 10/20/2011 0903       RADIOGRAPHIC STUDIES: Ct Chest W Contrast  11/25/2014   CLINICAL DATA:  Non small cell lung cancer. Initial diagnosis 2007. Recurrence January 2016. Ongoing chemotherapy.  EXAM: CT CHEST, ABDOMEN WITH CONTRAST  TECHNIQUE: Multidetector CT imaging of the chest and abdomen was performed following the standard protocol during bolus administration of intravenous contrast.  CONTRAST:  185m OMNIPAQUE  IOHEXOL 300 MG/ML  SOLN  COMPARISON:  CT 09/30/2014, 08/05/2014 and prior PET-CT 05/07/2014  FINDINGS: Recist 1.1  Target lesions:  1. Right lower lobe lesion on image number 23 measures 21 mm. Previous 20 mm. 2. Linear density on image 25 in the right lower lobe measures 16 mm. Previous 11 mm. Non target lesions:  1. 6 mm nodule in the right lower lobe on image 26.  Previous 8 mm.  1. CT CHEST FINDINGS  Chest wall: No chest wall mass, supraclavicular or axillary lymphadenopathy. Small scattered lymph nodes are stable. The thyroid gland is normal. The bony thorax is intact. No destructive bone lesions or spinal canal compromise.  Mediastinum: The heart is normal in size. No pericardial effusion. Small scattered mediastinal and hilar lymph nodes but no mass or adenopathy. The aorta is normal in caliber. No dissection. The esophagus is grossly normal.  Lungs/pleura: Stable mild emphysematous changes and surgical changes involving the right upper lobe. There are linear areas of scarring type change in the right lower lobe. No significant interval change in size or appearance. Stable nodular densities in the right lower lobe anteriorly on image number 26 and 22. These both measure 6 mm and are unchanged. No new pulmonary lesions. No pleural effusion.  CT ABDOMEN  FINDINGS  Hepatobiliary: No focal hepatic lesions or intrahepatic biliary dilatation. The gallbladder demonstrates a stable 25 mm gallstone. No findings for acute cholecystitis. No common bile duct dilatation.  Pancreas: No mass, inflammation or ductal dilatation.  Spleen: Normal size.  No focal lesions.  Adrenals/Urinary Tract: The adrenal glands and kidneys are unremarkable and stable.  Stomach/Bowel: The stomach, duodenum, visualized small bowel and visualized colon are normal. No inflammatory changes, mass lesions or obstructive findings.  Vascular/Lymphatic: No mesenteric or retroperitoneal mass or adenopathy. Small scattered lymph nodes are noted. The  aorta and branch vessels are patent. The major venous structures are patent.  Other: No ascites or subcutaneous lesions. No abdominal wall hernia.  Musculoskeletal: No significant bony findings.  IMPRESSION: 1. Stable surgical changes involving the right hemi thorax. No findings for mediastinal or hilar lymphadenopathy. 2. Stable right lung densities as discussed above. No new pulmonary lesions. No acute pulmonary findings. 3. No findings for upper abdominal metastatic disease. 4. No findings for osseous metastatic disease.   Electronically Signed  By: Marijo Sanes M.D.   On: 11/25/2014 10:02   Ct Abdomen W Contrast  11/25/2014   CLINICAL DATA:  Non small cell lung cancer. Initial diagnosis 2007. Recurrence January 2016. Ongoing chemotherapy.  EXAM: CT CHEST, ABDOMEN WITH CONTRAST  TECHNIQUE: Multidetector CT imaging of the chest and abdomen was performed following the standard protocol during bolus administration of intravenous contrast.  CONTRAST:  152m OMNIPAQUE IOHEXOL 300 MG/ML  SOLN  COMPARISON:  CT 09/30/2014, 08/05/2014 and prior PET-CT 05/07/2014  FINDINGS: Recist 1.1  Target lesions:  1. Right lower lobe lesion on image number 23 measures 21 mm. Previous 20 mm. 2. Linear density on image 25 in the right lower lobe measures 16 mm. Previous 11 mm. Non target lesions:  1. 6 mm nodule in the right lower lobe on image 26.  Previous 8 mm.  1. CT CHEST FINDINGS  Chest wall: No chest wall mass, supraclavicular or axillary lymphadenopathy. Small scattered lymph nodes are stable. The thyroid gland is normal. The bony thorax is intact. No destructive bone lesions or spinal canal compromise.  Mediastinum: The heart is normal in size. No pericardial effusion. Small scattered mediastinal and hilar lymph nodes but no mass or adenopathy. The aorta is normal in caliber. No dissection. The esophagus is grossly normal.  Lungs/pleura: Stable mild emphysematous changes and surgical changes involving the right upper lobe.  There are linear areas of scarring type change in the right lower lobe. No significant interval change in size or appearance. Stable nodular densities in the right lower lobe anteriorly on image number 26 and 22. These both measure 6 mm and are unchanged. No new pulmonary lesions. No pleural effusion.  CT ABDOMEN  FINDINGS  Hepatobiliary: No focal hepatic lesions or intrahepatic biliary dilatation. The gallbladder demonstrates a stable 25 mm gallstone. No findings for acute cholecystitis. No common bile duct dilatation.  Pancreas: No mass, inflammation or ductal dilatation.  Spleen: Normal size.  No focal lesions.  Adrenals/Urinary Tract: The adrenal glands and kidneys are unremarkable and stable.  Stomach/Bowel: The stomach, duodenum, visualized small bowel and visualized colon are normal. No inflammatory changes, mass lesions or obstructive findings.  Vascular/Lymphatic: No mesenteric or retroperitoneal mass or adenopathy. Small scattered lymph nodes are noted. The aorta and branch vessels are patent. The major venous structures are patent.  Other: No ascites or subcutaneous lesions. No abdominal wall hernia.  Musculoskeletal: No significant bony findings.  IMPRESSION: 1. Stable surgical changes involving the right hemi thorax. No findings for mediastinal or hilar lymphadenopathy. 2. Stable right lung densities as discussed above. No new pulmonary lesions. No acute pulmonary findings. 3. No findings for upper abdominal metastatic disease. 4. No findings for osseous metastatic disease.   Electronically Signed   By: PMarijo SanesM.D.   On: 11/25/2014 10:02   ASSESSMENT AND PLAN: This is a very pleasant 69years old white male with recurrent non-small cell lung cancer, adenocarcinoma with positive EGFR mutation in exon 21 that was initially diagnosed as stage IA non-small cell lung cancer status post right upper lobectomy and has been observation since October of 2007. He is status post 6 weeks treatment with  Tarceva 150 mg by mouth daily according to the BMS checkmate 370 clinical trial. He is currently on Tarceva 100 mg by mouth daily for the last 7 months and tolerating it fairly well.  The patient was discussed with and also seen by Dr. MJulien Nordmann We recommended for the patient to continue his current treatment with Tarceva 100  mg by mouth daily. He will follow up in approximately one month with a restaging CT scan of the chest, abdomen and pelvis with contrast to re-evaluate his disease. The last CT scan of the chest showed mild increase in some of the pulmonary nodules but not significant enough to consider for disease progression.  The patient was advised to call immediately if he has any other concerning symptoms in the interval.  All questions were answered. The patient knows to call the clinic with any problems, questions or concerns. We can certainly see the patient much sooner if necessary.  Carlton Adam, PA-C 12/18/2014   ADDENDUM: Hematology/Oncology Attending: I had a face to face encounter with the patient. I recommended his care plan. This is a very pleasant 69 years old white male with recurrent non-small cell lung cancer, adenocarcinoma currently undergoing treatment with Tarceva 100 mg by mouth daily and he is tolerating his treatment fairly well with no significant adverse effects. He had grade 1-2 skin rash recently but this completely resolved. I recommended for the patient to continue his treatment as scheduled. He would come back for follow-up visit in one month's for reevaluation with repeat CT scan of the chest for restaging of his disease. He was advised to call immediately if he has any concerning symptoms in the interval. Disclaimer: This note was dictated with voice recognition software. Similar sounding words can inadvertently be transcribed and may not be corrected upon review. Eilleen Kempf., MD 12/27/2014

## 2014-12-24 NOTE — Patient Instructions (Signed)
Continue Tarceva 100 mg by mouth daily Follow up in one month with a restaging CT scan of the chest, abdomen and pelvis to re-evaluate his disease

## 2014-12-31 ENCOUNTER — Other Ambulatory Visit: Payer: Self-pay | Admitting: Internal Medicine

## 2014-12-31 ENCOUNTER — Encounter: Payer: Self-pay | Admitting: *Deleted

## 2014-12-31 DIAGNOSIS — C3431 Malignant neoplasm of lower lobe, right bronchus or lung: Secondary | ICD-10-CM

## 2015-01-01 ENCOUNTER — Other Ambulatory Visit (HOSPITAL_BASED_OUTPATIENT_CLINIC_OR_DEPARTMENT_OTHER): Payer: Medicare Other

## 2015-01-01 ENCOUNTER — Encounter: Payer: Self-pay | Admitting: Oncology

## 2015-01-01 ENCOUNTER — Other Ambulatory Visit: Payer: Self-pay | Admitting: *Deleted

## 2015-01-01 ENCOUNTER — Ambulatory Visit (HOSPITAL_BASED_OUTPATIENT_CLINIC_OR_DEPARTMENT_OTHER): Payer: Medicare Other | Admitting: Internal Medicine

## 2015-01-01 ENCOUNTER — Other Ambulatory Visit (HOSPITAL_COMMUNITY)
Admission: RE | Admit: 2015-01-01 | Discharge: 2015-01-01 | Disposition: A | Discharge status: Home / Self Care | Payer: Medicare Other | Source: Ambulatory Visit | Attending: Internal Medicine | Admitting: Internal Medicine

## 2015-01-01 ENCOUNTER — Encounter: Payer: Self-pay | Admitting: *Deleted

## 2015-01-01 ENCOUNTER — Encounter: Payer: Self-pay | Admitting: Internal Medicine

## 2015-01-01 VITALS — BP 125/75 | HR 76 | Temp 97.5°F | Resp 17 | Ht 72.0 in | Wt 240.6 lb

## 2015-01-01 DIAGNOSIS — Z006 Encounter for examination for normal comparison and control in clinical research program: Secondary | ICD-10-CM

## 2015-01-01 DIAGNOSIS — Z5111 Encounter for antineoplastic chemotherapy: Secondary | ICD-10-CM

## 2015-01-01 DIAGNOSIS — C3431 Malignant neoplasm of lower lobe, right bronchus or lung: Secondary | ICD-10-CM

## 2015-01-01 DIAGNOSIS — I2510 Atherosclerotic heart disease of native coronary artery without angina pectoris: Secondary | ICD-10-CM

## 2015-01-01 DIAGNOSIS — Z79899 Other long term (current) drug therapy: Secondary | ICD-10-CM

## 2015-01-01 LAB — CBC WITH DIFFERENTIAL/PLATELET
BASO%: 0.5 % (ref 0.0–2.0)
Basophils Absolute: 0 10*3/uL (ref 0.0–0.1)
EOS%: 3.7 % (ref 0.0–7.0)
Eosinophils Absolute: 0.2 10*3/uL (ref 0.0–0.5)
HCT: 42.7 % (ref 38.4–49.9)
HEMOGLOBIN: 14.5 g/dL (ref 13.0–17.1)
LYMPH%: 34.1 % (ref 14.0–49.0)
MCH: 33.4 pg (ref 27.2–33.4)
MCHC: 34 g/dL (ref 32.0–36.0)
MCV: 98.4 fL — ABNORMAL HIGH (ref 79.3–98.0)
MONO#: 0.7 10*3/uL (ref 0.1–0.9)
MONO%: 11.1 % (ref 0.0–14.0)
NEUT%: 50.6 % (ref 39.0–75.0)
NEUTROS ABS: 3.2 10*3/uL (ref 1.5–6.5)
PLATELETS: 231 10*3/uL (ref 140–400)
RBC: 4.34 10*6/uL (ref 4.20–5.82)
RDW: 13.6 % (ref 11.0–14.6)
WBC: 6.3 10*3/uL (ref 4.0–10.3)
lymph#: 2.1 10*3/uL (ref 0.9–3.3)

## 2015-01-01 LAB — LACTATE DEHYDROGENASE (CC13): LDH: 193 U/L (ref 125–245)

## 2015-01-01 LAB — COMPREHENSIVE METABOLIC PANEL (CC13)
ALBUMIN: 4.1 g/dL (ref 3.5–5.0)
ALT: 36 U/L (ref 0–55)
AST: 31 U/L (ref 5–34)
Alkaline Phosphatase: 67 U/L (ref 40–150)
Anion Gap: 6 mEq/L (ref 3–11)
BILIRUBIN TOTAL: 0.43 mg/dL (ref 0.20–1.20)
BUN: 16 mg/dL (ref 7.0–26.0)
CO2: 30 meq/L — AB (ref 22–29)
CREATININE: 1.1 mg/dL (ref 0.7–1.3)
Calcium: 9.8 mg/dL (ref 8.4–10.4)
Chloride: 105 mEq/L (ref 98–109)
EGFR: 70 mL/min/{1.73_m2} — ABNORMAL LOW (ref >90)
GLUCOSE: 100 mg/dL (ref 70–140)
Potassium: 4.2 mEq/L (ref 3.5–5.1)
SODIUM: 142 meq/L (ref 136–145)
TOTAL PROTEIN: 6.6 g/dL (ref 6.4–8.3)

## 2015-01-01 LAB — PHOSPHORUS: Phosphorus: 3.6 mg/dL (ref 2.5–4.6)

## 2015-01-01 LAB — MAGNESIUM (CC13): MAGNESIUM: 2.3 mg/dL (ref 1.5–2.5)

## 2015-01-01 LAB — TSH CHCC: TSH: 3.918 m(IU)/L (ref 0.320–4.118)

## 2015-01-01 MED ORDER — ERLOTINIB HCL 100 MG PO TABS: 100.0000 mg | ORAL_TABLET | Freq: Every day | ORAL | Status: DC

## 2015-01-01 NOTE — Progress Notes (Signed)
01/01/2015 BMS 370 Cycle 16, week 30  The patient comes to clinic today accompanied by his wife.  He completed the PROs.  He had labs drawn and resting vital signs with resting O2 sat. taken. He states the rash he had on bilat. arms a few weeks ago is much improved and resolved.  He still has a a few rash spots on head, neck, face and back, but it is much improved and itches from time to time.  He states it comes and goes.  The patient completed the oral doxycycline on 12/26/14.  He continues to use the clindamycin gel prn . He remains very active and  gets occasional dyspnea on exertion (baseline). He has mild fatigue.  He has been traveling quite a bit. He reports the toe nail area is getting better as well as the "jock itch" in his groins, but not completely gone away. He denies any other problems or complaints, including diarrhea, constipation, changes in breathing. He denies pain, headache, neuro or vision problems. Based on physical exam and lab results review by Dr. [name], the patient was cleared to continue treatment with erlotinib 100 mg daily. The patient denied any  Questions, complaints, or concerns.

## 2015-01-01 NOTE — Progress Notes (Signed)
[place] [phone]   [phone]  OFFICE PROGRESS NOTE  [name],[name], MD 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 Roxan Hockey on 01/23/2006.  2) Tarceva 150 mg by mouth daily as part of the BMS checkmate 370 clinical trial. Status post 6 weeks of treatment.   CURRENT THERAPY: Tarceva 100 mg by mouth daily as part of the BMS checkmate 370 clinical trial. Status post 7 months of treatment.  DISEASE STAGE: Recurrent  CHEMOTHERAPY INTENT: Palliative.  CURRENT # OF CHEMOTHERAPY CYCLES: 8  CURRENT ANTIEMETICS: None  CURRENT SMOKING STATUS: Non-smoker  ORAL CHEMOTHERAPY AND CONSENT: None  CURRENT BISPHOSPHONATES USE: None  LIVING WILL AND CODE STATUS: Full code.  INTERVAL HISTORY: [name] 69 y.o. male returns to the clinic today for followup visit accompanied by his wife. The patient is tolerating his current treatment with Tarceva fairly well was no significant adverse effects. His skin rash has significantly improved. He denied having any significant diarrhea. He denied having any significant chest pain, shortness of breath, cough or hemoptysis. The patient denied having any fever or chills, no nausea or vomiting. He denied having any significant weight loss or night sweats. He is here today for evaluation and repeat blood work.  MEDICAL HISTORY: Past Medical History  Diagnosis Date  . Hypertension   . Hypercholesterolemia   . Sinus disease     hx of  . History of pneumonia  last 3-4 yrs ago    3 -4 different times  . Hypothyroidism   . Sleep apnea     uses cpap setting opf 12  . History  of agent Orange exposure 44-45 yrs ago  . Complication of anesthesia 2007    quit breathing with bronchoscopy, had to spend night  . lung ca dx'd 2007    lung right    ALLERGIES:  is allergic to lisinopril.  MEDICATIONS:  Current Outpatient Prescriptions  Medication Sig Dispense Refill  . amitriptyline (ELAVIL) 50 MG tablet Take 50 mg by mouth at bedtime.    [name] aspirin 81 MG tablet Take 81 mg by mouth every morning.     . cholecalciferol (VITAMIN D) 1000 UNITS tablet Take 2,000 Units by mouth daily.     . clindamycin (CLEOCIN-T) 1 % external solution Apply topically 2 (two) times daily. 240 mL 2  . clindamycin (CLINDAGEL) 1 % gel   1  . diltiazem (CARDIZEM CD) 360 MG 24 hr capsule Take 360 mg by mouth every morning.     [name] doxycycline (VIBRA-TABS) 100 MG tablet   0  . erlotinib (TARCEVA) 100 MG tablet Take 1 tablet (100 mg total) by mouth daily. Take on an empty stomach 1 hour before meals or 2 hours after.    . fluticasone (CUTIVATE) 0.05 % cream Apply 1 application topically.    [name] ketoconazole (NIZORAL) 2 % cream Apply 1 application topically daily.    [name] levETIRAcetam (KEPPRA) 750 MG tablet     . levothyroxine (SYNTHROID, LEVOTHROID) 75 MCG tablet     . metoCLOPramide (REGLAN) 10 MG tablet Take 10 mg by mouth every 6 (six) hours as needed for nausea.     [name]  metoprolol succinate (TOPROL-XL) 25 MG 24 hr tablet Take 25 mg by mouth every morning.     . Multiple Vitamins-Minerals (CENTRUM SILVER PO) Take 1 tablet by mouth daily.     . naproxen sodium (ANAPROX) 550 MG tablet Take 550 mg by mouth 3 (three) times daily as needed for moderate pain.     . nitroGLYCERIN (NITROSTAT) 0.4 MG SL tablet Place 0.4 mg under the tongue every 5 (five) minutes as needed for chest pain.     . polyethylene glycol (MIRALAX / GLYCOLAX) packet Take 17 g by mouth daily.    . psyllium (METAMUCIL) 58.6 % powder Take 1 packet by mouth 3 (three) times daily.    . rosuvastatin (CRESTOR) 5 MG tablet Take 5 mg by mouth  every morning.     . Terbinafine HCl (LAMISIL AT JOCK ITCH EX) Apply 1 drop topically. Apply as directed to nails. Mix with DMSO 30 ml    . valsartan-hydrochlorothiazide (DIOVAN-HCT) 320-25 MG per tablet Take 1 tablet by mouth every morning.      No current facility-administered medications for this visit.    SURGICAL HISTORY:  Past Surgical History  Procedure Laterality Date  . Cystourethroscopy, gyrus turp.  04/16/2010  . Torn medial and lateral menisci, left knee.  11/06/2006  . Right video-assisted thoracoscopy, right upper lobectomy and  01/23/2006  . The olympus video bronchoscope was introduced via the right  12/21/2005  . Nasal sinus surgery  1987  . Tonsillectomy  1957  . Colonoscopy with propofol N/A 03/19/2013    Procedure: COLONOSCOPY WITH PROPOFOL;  Surgeon: Garlan Fair, MD;  Location: [facility];  Service: Endoscopy;  Laterality: N/A;    REVIEW OF SYSTEMS:  Constitutional: negative Eyes: negative Ears, nose, mouth, throat, and face: negative Respiratory: negative Cardiovascular: negative Gastrointestinal: negative Genitourinary:negative Integument/breast: positive for dryness and rash Hematologic/lymphatic: negative Musculoskeletal:negative Neurological: negative Behavioral/Psych: negative Endocrine: negative Allergic/Immunologic: negative   PHYSICAL EXAMINATION: General appearance: alert, cooperative and no distress Head: Normocephalic, without obvious abnormality, atraumatic Neck: no adenopathy Lymph nodes: Cervical, supraclavicular, and axillary nodes normal. Resp: clear to auscultation bilaterally Back: symmetric, no curvature. ROM normal. No CVA tenderness. Cardio: regular rate and rhythm, S1, S2 normal, no murmur, click, rub or gallop GI: soft, non-tender; bowel sounds normal; no masses,  no organomegaly Extremities: extremities normal, atraumatic, no cyanosis or edema Neurologic: Alert and oriented X 3, normal strength and tone. Normal symmetric  reflexes. Normal coordination and gait    ECOG PERFORMANCE STATUS: 1 - Symptomatic but completely ambulatory  Blood pressure 125/75, pulse 76, temperature 97.5 F (36.4 C), temperature source Oral, resp. rate 17, height 6' (1.829 m), weight 240 lb 9.6 oz (109.135 kg), SpO2 100 %.  LABORATORY DATA: Lab Results  Component Value Date   WBC 6.3 01/01/2015   HGB 14.5 01/01/2015   HCT 42.7 01/01/2015   MCV 98.4* 01/01/2015   PLT 231 01/01/2015      Chemistry      Component Value Date/Time   NA 142 01/01/2015 1035   NA 138 07/17/2014 1000   NA 139 10/20/2011 0903   K 4.2 01/01/2015 1035   K 3.3* 07/17/2014 1000   K 3.4 10/20/2011 0903   CL 107 07/17/2014 1000   CL 102 04/24/2012 0853   CL 101 10/20/2011 0903   CO2 30* 01/01/2015 1035   CO2 26 07/17/2014 1000   CO2 29 10/20/2011 0903   BUN 16.0 01/01/2015 1035   BUN 16 07/17/2014 1000   BUN 12  10/20/2011 0903   CREATININE 1.1 01/01/2015 1035   CREATININE 0.82 07/17/2014 1000   CREATININE 0.9 10/20/2011 0903      Component Value Date/Time   CALCIUM 9.8 01/01/2015 1035   CALCIUM 8.9 07/17/2014 1000   CALCIUM 9.0 10/20/2011 0903   ALKPHOS 67 01/01/2015 1035   ALKPHOS 53 07/17/2014 1000   ALKPHOS 52 10/20/2011 0903   AST 31 01/01/2015 1035   AST 24 07/17/2014 1000   AST 24 10/20/2011 0903   ALT 36 01/01/2015 1035   ALT 28 07/17/2014 1000   ALT 31 10/20/2011 0903   BILITOT 0.43 01/01/2015 1035   BILITOT 0.8 07/17/2014 1000   BILITOT 0.60 10/20/2011 0903       RADIOGRAPHIC STUDIES: No results found. ASSESSMENT AND PLAN: This is a very pleasant 69 years old white male with recurrent non-small cell lung cancer, adenocarcinoma with positive EGFR mutation in exon 21 that was initially diagnosed as stage IA non-small cell lung cancer status post right upper lobectomy and has been observation since October of 2007. He is status post 6 weeks treatment with Tarceva 150 mg by mouth daily according to the BMS checkmate 370  clinical trial. He is currently on Tarceva 100 mg by mouth daily for the last 7 months and tolerating it fairly well.  I recommended for the patient to continue his current treatment with Tarceva 100 mg by mouth daily. He would come back for follow-up visit in 2 weeks for reevaluation and management of any adverse effects.   The patient was advised to call immediately if he has any other concerning symptoms in the interval.  All questions were answered. The patient knows to call the clinic with any problems, questions or concerns. We can certainly see the patient much sooner if necessary.  Disclaimer: This note was dictated with voice recognition software. Similar sounding words can inadvertently be transcribed and may not be corrected upon review.

## 2015-01-01 NOTE — Progress Notes (Signed)
BMS [id number] - Questionnaires (PRO's) - Patient into cancer center for routine visit.  Patient given PRO's upon arrival to the cancer center.  Patient completed PRO's (EQ-5D-3L first and then FACT-L) before any study procedures were performed.  I checked the PRO's for completeness. The patient was thanked for his continued support in this clinical trial.  [name] 01/01/15 - 10:30 am

## 2015-01-02 ENCOUNTER — Telehealth: Payer: Self-pay | Admitting: Internal Medicine

## 2015-01-02 NOTE — Telephone Encounter (Signed)
Appointments added per pof for 11 and 03/2015

## 2015-01-14 ENCOUNTER — Other Ambulatory Visit: Payer: Self-pay | Admitting: Internal Medicine

## 2015-01-15 ENCOUNTER — Other Ambulatory Visit (HOSPITAL_COMMUNITY)
Admission: RE | Admit: 2015-01-15 | Discharge: 2015-01-15 | Disposition: A | Discharge status: Home / Self Care | Payer: Medicare Other | Source: Ambulatory Visit | Attending: Internal Medicine | Admitting: Internal Medicine

## 2015-01-15 ENCOUNTER — Ambulatory Visit (HOSPITAL_BASED_OUTPATIENT_CLINIC_OR_DEPARTMENT_OTHER): Payer: Medicare Other | Admitting: Internal Medicine

## 2015-01-15 ENCOUNTER — Telehealth: Payer: Self-pay | Admitting: Internal Medicine

## 2015-01-15 ENCOUNTER — Encounter: Payer: Self-pay | Admitting: Medical Oncology

## 2015-01-15 ENCOUNTER — Encounter: Payer: Self-pay | Admitting: Internal Medicine

## 2015-01-15 ENCOUNTER — Other Ambulatory Visit (HOSPITAL_BASED_OUTPATIENT_CLINIC_OR_DEPARTMENT_OTHER): Payer: Medicare Other

## 2015-01-15 VITALS — BP 127/76 | HR 74 | Temp 97.6°F | Resp 20 | Ht 72.0 in | Wt 237.2 lb

## 2015-01-15 DIAGNOSIS — C3411 Malignant neoplasm of upper lobe, right bronchus or lung: Secondary | ICD-10-CM | POA: Insufficient documentation

## 2015-01-15 DIAGNOSIS — Z85118 Personal history of other malignant neoplasm of bronchus and lung: Secondary | ICD-10-CM | POA: Diagnosis not present

## 2015-01-15 DIAGNOSIS — C3431 Malignant neoplasm of lower lobe, right bronchus or lung: Secondary | ICD-10-CM

## 2015-01-15 DIAGNOSIS — Z006 Encounter for examination for normal comparison and control in clinical research program: Secondary | ICD-10-CM

## 2015-01-15 LAB — COMPREHENSIVE METABOLIC PANEL (CC13)
ALT: 31 U/L (ref 0–55)
ANION GAP: 4 meq/L (ref 3–11)
AST: 29 U/L (ref 5–34)
Albumin: 4.1 g/dL (ref 3.5–5.0)
Alkaline Phosphatase: 62 U/L (ref 40–150)
BILIRUBIN TOTAL: 0.59 mg/dL (ref 0.20–1.20)
BUN: 16.2 mg/dL (ref 7.0–26.0)
CO2: 30 meq/L — AB (ref 22–29)
CREATININE: 1 mg/dL (ref 0.7–1.3)
Calcium: 9.5 mg/dL (ref 8.4–10.4)
Chloride: 106 mEq/L (ref 98–109)
EGFR: 74 mL/min/{1.73_m2} — ABNORMAL LOW (ref >90)
GLUCOSE: 98 mg/dL (ref 70–140)
Potassium: 3.7 mEq/L (ref 3.5–5.1)
Sodium: 141 mEq/L (ref 136–145)
TOTAL PROTEIN: 6.6 g/dL (ref 6.4–8.3)

## 2015-01-15 LAB — CBC WITH DIFFERENTIAL/PLATELET
BASO%: 0.4 % (ref 0.0–2.0)
Basophils Absolute: 0 10*3/uL (ref 0.0–0.1)
EOS%: 3.8 % (ref 0.0–7.0)
Eosinophils Absolute: 0.2 10*3/uL (ref 0.0–0.5)
HCT: 41.4 % (ref 38.4–49.9)
HEMOGLOBIN: 14.3 g/dL (ref 13.0–17.1)
LYMPH#: 2 10*3/uL (ref 0.9–3.3)
LYMPH%: 39.6 % (ref 14.0–49.0)
MCH: 33.7 pg — ABNORMAL HIGH (ref 27.2–33.4)
MCHC: 34.5 g/dL (ref 32.0–36.0)
MCV: 97.6 fL (ref 79.3–98.0)
MONO#: 0.7 10*3/uL (ref 0.1–0.9)
MONO%: 14.7 % — ABNORMAL HIGH (ref 0.0–14.0)
NEUT%: 41.5 % (ref 39.0–75.0)
NEUTROS ABS: 2.1 10*3/uL (ref 1.5–6.5)
PLATELETS: 212 10*3/uL (ref 140–400)
RBC: 4.24 10*6/uL (ref 4.20–5.82)
RDW: 12.9 % (ref 11.0–14.6)
WBC: 5.1 10*3/uL (ref 4.0–10.3)

## 2015-01-15 LAB — LACTATE DEHYDROGENASE (CC13): LDH: 197 U/L (ref 125–245)

## 2015-01-15 LAB — PHOSPHORUS: PHOSPHORUS: 2.9 mg/dL (ref 2.5–4.6)

## 2015-01-15 LAB — MAGNESIUM (CC13): Magnesium: 2.3 mg/dl (ref 1.5–2.5)

## 2015-01-15 MED ORDER — ERLOTINIB HCL 100 MG PO TABS: 100.0000 mg | ORAL_TABLET | Freq: Every day | ORAL | Status: DC

## 2015-01-15 NOTE — Progress Notes (Signed)
[name] Telephone:([phone]   Fax:(336) [phone]  OFFICE PROGRESS NOTE  Andrew NEVILL, MD 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 Roxan Hockey on 01/23/2006.  2) Tarceva 150 mg by mouth daily as part of the BMS checkmate 370 clinical trial. Status post 6 weeks of treatment.   CURRENT THERAPY: Tarceva 100 mg by mouth daily as part of the BMS checkmate 370 clinical trial. Status post 8 months of treatment.  DISEASE STAGE: Recurrent  CHEMOTHERAPY INTENT: Palliative.  CURRENT # OF CHEMOTHERAPY CYCLES: 8  CURRENT ANTIEMETICS: None  CURRENT SMOKING STATUS: Non-smoker  ORAL CHEMOTHERAPY AND CONSENT: None  CURRENT BISPHOSPHONATES USE: None  LIVING WILL AND CODE STATUS: Full code.  INTERVAL HISTORY: [name] 69 y.o. male returns to the clinic today for followup visit accompanied by his wife. The patient is tolerating his current treatment with Tarceva fairly well with no significant adverse effects. He has no skin rash or diarrhea. He denied having any significant chest pain, shortness of breath, cough or hemoptysis. The patient denied having any fever or chills, no nausea or vomiting. He denied having any significant weight loss or night sweats. He is here today for evaluation and repeat blood work.  MEDICAL HISTORY: Past Medical History  Diagnosis Date  . Hypertension   . Hypercholesterolemia   . Sinus disease     hx of  . History of pneumonia  last 3-4 yrs ago    3 -4 different times  . Hypothyroidism   . Sleep apnea     uses cpap setting opf 12  . History of agent Orange exposure 44-45 yrs ago  .  Complication of anesthesia 2007    quit breathing with bronchoscopy, had to spend night  . lung ca dx'd 2007    lung right    ALLERGIES:  is allergic to lisinopril.  MEDICATIONS:  Current Outpatient Prescriptions  Medication Sig Dispense Refill  . amitriptyline (ELAVIL) 50 MG tablet Take 50 mg by mouth at bedtime.    [name] aspirin 81 MG tablet Take 81 mg by mouth every morning.     . cholecalciferol (VITAMIN D) 1000 UNITS tablet Take 2,000 Units by mouth daily.     . clindamycin (CLEOCIN-T) 1 % external solution Apply topically 2 (two) times daily. 240 mL 2  . clindamycin (CLINDAGEL) 1 % gel   1  . diltiazem (CARDIZEM CD) 360 MG 24 hr capsule Take 360 mg by mouth every morning.     . erlotinib (TARCEVA) 100 MG tablet Take 1 tablet (100 mg total) by mouth daily. Take on an empty stomach 1 hour before meals or 2 hours after.    . fluticasone (CUTIVATE) 0.05 % cream Apply 1 application topically.    [name] ketoconazole (NIZORAL) 2 % cream Apply 1 application topically daily.    [name] levETIRAcetam (KEPPRA) 750 MG tablet     . levothyroxine (SYNTHROID, LEVOTHROID) 75 MCG tablet     . metoCLOPramide (REGLAN) 10 MG tablet Take 10 mg by mouth every 6 (six) hours as needed for nausea.     . metoprolol succinate (TOPROL-XL) 25 MG 24 hr tablet Take 25 mg by mouth every morning.     [name]  Multiple Vitamins-Minerals (CENTRUM SILVER PO) Take 1 tablet by mouth daily.     . naproxen sodium (ANAPROX) 550 MG tablet Take 550 mg by mouth 3 (three) times daily as needed for moderate pain.     . nitroGLYCERIN (NITROSTAT) 0.4 MG SL tablet Place 0.4 mg under the tongue every 5 (five) minutes as needed for chest pain.     . polyethylene glycol (MIRALAX / GLYCOLAX) packet Take 17 g by mouth daily.    . psyllium (METAMUCIL) 58.6 % powder Take 1 packet by mouth 3 (three) times daily.    . rosuvastatin (CRESTOR) 5 MG tablet Take 5 mg by mouth every morning.     . Terbinafine HCl (LAMISIL AT JOCK ITCH EX) Apply 1 drop topically.  Apply as directed to nails. Mix with DMSO 30 ml    . valsartan-hydrochlorothiazide (DIOVAN-HCT) 320-25 MG per tablet Take 1 tablet by mouth every morning.      No current facility-administered medications for this visit.    SURGICAL HISTORY:  Past Surgical History  Procedure Laterality Date  . Cystourethroscopy, gyrus turp.  04/16/2010  . Torn medial and lateral menisci, left knee.  11/06/2006  . Right video-assisted thoracoscopy, right upper lobectomy and  01/23/2006  . The olympus video bronchoscope was introduced via the right  12/21/2005  . Nasal sinus surgery  1987  . Tonsillectomy  1957  . Colonoscopy with propofol N/A 03/19/2013    Procedure: COLONOSCOPY WITH PROPOFOL;  Surgeon: Garlan Fair, MD;  Location: [facility];  Service: Endoscopy;  Laterality: N/A;    REVIEW OF SYSTEMS:  A comprehensive review of systems was negative.   PHYSICAL EXAMINATION: General appearance: alert, cooperative and no distress Head: Normocephalic, without obvious abnormality, atraumatic Neck: no adenopathy Lymph nodes: Cervical, supraclavicular, and axillary nodes normal. Resp: clear to auscultation bilaterally Back: symmetric, no curvature. ROM normal. No CVA tenderness. Cardio: regular rate and rhythm, S1, S2 normal, no murmur, click, rub or gallop GI: soft, non-tender; bowel sounds normal; no masses,  no organomegaly Extremities: extremities normal, atraumatic, no cyanosis or edema Neurologic: Alert and oriented X 3, normal strength and tone. Normal symmetric reflexes. Normal coordination and gait    ECOG PERFORMANCE STATUS: 1 - Symptomatic but completely ambulatory  Blood pressure 127/76, pulse 74, temperature 97.6 F (36.4 C), temperature source Oral, resp. rate 20, height 6' (1.829 m), weight 237 lb 3.2 oz (107.593 kg), SpO2 100 %.  LABORATORY DATA: Lab Results  Component Value Date   WBC 5.1 01/15/2015   HGB 14.3 01/15/2015   HCT 41.4 01/15/2015   MCV 97.6 01/15/2015   PLT  212 01/15/2015      Chemistry      Component Value Date/Time   NA 141 01/15/2015 0918   NA 138 07/17/2014 1000   NA 139 10/20/2011 0903   K 3.7 01/15/2015 0918   K 3.3* 07/17/2014 1000   K 3.4 10/20/2011 0903   CL 107 07/17/2014 1000   CL 102 04/24/2012 0853   CL 101 10/20/2011 0903   CO2 30* 01/15/2015 0918   CO2 26 07/17/2014 1000   CO2 29 10/20/2011 0903   BUN 16.2 01/15/2015 0918   BUN 16 07/17/2014 1000   BUN 12 10/20/2011 0903   CREATININE 1.0 01/15/2015 0918   CREATININE 0.82 07/17/2014 1000   CREATININE 0.9 10/20/2011 0903      Component Value Date/Time   CALCIUM 9.5 01/15/2015 0918   CALCIUM 8.9 07/17/2014 1000   CALCIUM 9.0 10/20/2011 0903  ALKPHOS 62 01/15/2015 0918   ALKPHOS 53 07/17/2014 1000   ALKPHOS 52 10/20/2011 0903   AST 29 01/15/2015 0918   AST 24 07/17/2014 1000   AST 24 10/20/2011 0903   ALT 31 01/15/2015 0918   ALT 28 07/17/2014 1000   ALT 31 10/20/2011 0903   BILITOT 0.59 01/15/2015 0918   BILITOT 0.8 07/17/2014 1000   BILITOT 0.60 10/20/2011 0903       RADIOGRAPHIC STUDIES: No results found. ASSESSMENT AND PLAN: This is a very pleasant 69 years old white male with recurrent non-small cell lung cancer, adenocarcinoma with positive EGFR mutation in exon 21 that was initially diagnosed as stage IA non-small cell lung cancer status post right upper lobectomy and has been observation since October of 2007. He is status post 6 weeks treatment with Tarceva 150 mg by mouth daily according to the BMS checkmate 370 clinical trial. He is currently on Tarceva 100 mg by mouth daily for the last 8 months and tolerating it fairly well.  I recommended for the patient to continue his current treatment with Tarceva 100 mg by mouth daily. He would come back for follow-up visit in 2 weeks for reevaluation after repeating CT scan of the chest for restaging of his disease.    The patient was advised to call immediately if he has any other concerning symptoms  in the interval.  All questions were answered. The patient knows to call the clinic with any problems, questions or concerns. We can certainly see the patient much sooner if necessary.  Disclaimer: This note was dictated with voice recognition software. Similar sounding words can inadvertently be transcribed and may not be corrected upon review.        

## 2015-01-15 NOTE — Telephone Encounter (Signed)
gave pt updated sch

## 2015-01-15 NOTE — Progress Notes (Signed)
BMS 370: Group D, Arm A: Cycle 17/week 32 Met with patient and spouse after lab appointment. Resting vitals obtained; O2 saturation @ 100%, B/P= 121/76, HR=74, R=20. Patient maintaining weight. Review of patient systems with patient and patient denies any new symptoms, issues or concerns. Patient denies any new A/E's. Patient reports good appetite, denies having any pain, denies any bowel or bladder problems, denies breathing problems or any unusual bleeding, as well as, denies headaches or chest pain. Patient reports rash to face, arms, head, neck and back have significantly improved and he reports to be feeling well. Patient denies any new medications or changes to his current medication list. Per Dr. Worthy Flank assessment of patient and today's lab results as well as review of [facility] labs, patient to continue with Tarceva as directed. I confirmed CT scan appointment with patient, all patient's and spouses questions answered to their satisfaction and were encouraged to contact clinic, Dr. [name] or research dept with any questions or concerns. Patient received contrast today for next weeks CT scan.  Adele Dan, RN, BSN Clinical Research 01/15/2015 11:03 AM

## 2015-01-22 ENCOUNTER — Ambulatory Visit (HOSPITAL_COMMUNITY)
Admission: RE | Admit: 2015-01-22 | Discharge: 2015-01-22 | Disposition: A | Discharge status: Home / Self Care | Payer: Medicare Other | Source: Ambulatory Visit | Attending: Physician Assistant | Admitting: Physician Assistant

## 2015-01-22 ENCOUNTER — Encounter (HOSPITAL_COMMUNITY): Payer: Self-pay

## 2015-01-22 DIAGNOSIS — R918 Other nonspecific abnormal finding of lung field: Secondary | ICD-10-CM | POA: Diagnosis not present

## 2015-01-22 DIAGNOSIS — Z08 Encounter for follow-up examination after completed treatment for malignant neoplasm: Secondary | ICD-10-CM | POA: Insufficient documentation

## 2015-01-22 DIAGNOSIS — C3490 Malignant neoplasm of unspecified part of unspecified bronchus or lung: Secondary | ICD-10-CM | POA: Diagnosis not present

## 2015-01-22 DIAGNOSIS — C3431 Malignant neoplasm of lower lobe, right bronchus or lung: Secondary | ICD-10-CM | POA: Insufficient documentation

## 2015-01-22 MED ORDER — IOHEXOL 300 MG/ML  SOLN
100.0000 mL | Freq: Once | INTRAMUSCULAR | Status: AC | PRN
  Administered 2015-01-22: 100 mL via INTRAVENOUS

## 2015-01-26 ENCOUNTER — Ambulatory Visit: Payer: Medicare Other | Admitting: Internal Medicine

## 2015-01-26 ENCOUNTER — Other Ambulatory Visit: Payer: Medicare Other

## 2015-01-27 ENCOUNTER — Other Ambulatory Visit: Payer: Self-pay | Admitting: *Deleted

## 2015-01-27 ENCOUNTER — Encounter: Payer: Self-pay | Admitting: *Deleted

## 2015-01-27 ENCOUNTER — Encounter: Payer: Self-pay | Admitting: Internal Medicine

## 2015-01-27 DIAGNOSIS — C3431 Malignant neoplasm of lower lobe, right bronchus or lung: Secondary | ICD-10-CM

## 2015-01-28 ENCOUNTER — Ambulatory Visit (HOSPITAL_BASED_OUTPATIENT_CLINIC_OR_DEPARTMENT_OTHER): Payer: Medicare Other | Admitting: Internal Medicine

## 2015-01-28 ENCOUNTER — Encounter: Payer: Self-pay | Admitting: Internal Medicine

## 2015-01-28 ENCOUNTER — Other Ambulatory Visit (HOSPITAL_BASED_OUTPATIENT_CLINIC_OR_DEPARTMENT_OTHER): Payer: Medicare Other

## 2015-01-28 ENCOUNTER — Other Ambulatory Visit (HOSPITAL_COMMUNITY)
Admission: RE | Admit: 2015-01-28 | Discharge: 2015-01-28 | Disposition: A | Discharge status: Home / Self Care | Payer: Medicare Other | Source: Ambulatory Visit | Attending: Internal Medicine | Admitting: Internal Medicine

## 2015-01-28 ENCOUNTER — Other Ambulatory Visit: Payer: Self-pay | Admitting: *Deleted

## 2015-01-28 VITALS — BP 127/80 | HR 67 | Temp 97.6°F | Resp 18 | Ht 72.0 in | Wt 241.4 lb

## 2015-01-28 DIAGNOSIS — C3490 Malignant neoplasm of unspecified part of unspecified bronchus or lung: Secondary | ICD-10-CM | POA: Insufficient documentation

## 2015-01-28 DIAGNOSIS — C3431 Malignant neoplasm of lower lobe, right bronchus or lung: Secondary | ICD-10-CM

## 2015-01-28 DIAGNOSIS — C3491 Malignant neoplasm of unspecified part of right bronchus or lung: Secondary | ICD-10-CM

## 2015-01-28 DIAGNOSIS — Z006 Encounter for examination for normal comparison and control in clinical research program: Secondary | ICD-10-CM

## 2015-01-28 LAB — CBC WITH DIFFERENTIAL/PLATELET
BASO%: 0.7 % (ref 0.0–2.0)
BASOS ABS: 0 10*3/uL (ref 0.0–0.1)
EOS ABS: 0.2 10*3/uL (ref 0.0–0.5)
EOS%: 2.9 % (ref 0.0–7.0)
HCT: 41.6 % (ref 38.4–49.9)
HGB: 14.1 g/dL (ref 13.0–17.1)
LYMPH%: 36.8 % (ref 14.0–49.0)
MCH: 33.5 pg — AB (ref 27.2–33.4)
MCHC: 34 g/dL (ref 32.0–36.0)
MCV: 98.5 fL — AB (ref 79.3–98.0)
MONO#: 0.7 10*3/uL (ref 0.1–0.9)
MONO%: 10.1 % (ref 0.0–14.0)
NEUT#: 3.2 10*3/uL (ref 1.5–6.5)
NEUT%: 49.5 % (ref 39.0–75.0)
Platelets: 222 10*3/uL (ref 140–400)
RBC: 4.22 10*6/uL (ref 4.20–5.82)
RDW: 13.4 % (ref 11.0–14.6)
WBC: 6.5 10*3/uL (ref 4.0–10.3)
lymph#: 2.4 10*3/uL (ref 0.9–3.3)

## 2015-01-28 LAB — COMPREHENSIVE METABOLIC PANEL (CC13)
ALBUMIN: 4.1 g/dL (ref 3.5–5.0)
ALK PHOS: 62 U/L (ref 40–150)
ALT: 31 U/L (ref 0–55)
AST: 28 U/L (ref 5–34)
Anion Gap: 5 mEq/L (ref 3–11)
BILIRUBIN TOTAL: 0.52 mg/dL (ref 0.20–1.20)
BUN: 17.3 mg/dL (ref 7.0–26.0)
CALCIUM: 9.7 mg/dL (ref 8.4–10.4)
CO2: 30 mEq/L — ABNORMAL HIGH (ref 22–29)
CREATININE: 1 mg/dL (ref 0.7–1.3)
Chloride: 106 mEq/L (ref 98–109)
EGFR: 77 mL/min/{1.73_m2} — ABNORMAL LOW (ref >90)
Glucose: 102 mg/dl (ref 70–140)
POTASSIUM: 4.1 meq/L (ref 3.5–5.1)
SODIUM: 141 meq/L (ref 136–145)
TOTAL PROTEIN: 6.7 g/dL (ref 6.4–8.3)

## 2015-01-28 LAB — PHOSPHORUS: PHOSPHORUS: 3.2 mg/dL (ref 2.5–4.6)

## 2015-01-28 LAB — LACTATE DEHYDROGENASE (CC13): LDH: 185 U/L (ref 125–245)

## 2015-01-28 LAB — MAGNESIUM (CC13): MAGNESIUM: 2.3 mg/dL (ref 1.5–2.5)

## 2015-01-28 MED ORDER — ERLOTINIB HCL 100 MG PO TABS: 100.0000 mg | ORAL_TABLET | Freq: Every day | ORAL | Status: DC

## 2015-01-28 NOTE — Progress Notes (Signed)
01/28/2015  1010  BMS 370, Group D, Arm A, Cycle 18, week 34 The patient arrives to clinic accompanied by his wife.  Resting vital signs were taken, including O2 sat.  The patient's weight remains stable. The patient states he has had a good last few weeks.  He reports that he has not missed any doses of study drug and that his medications remain the same.    He continues to be active with walking and going to football games.  He states his breathing is unchanged, slight dyspnea only with exertion.  He has mild fatigue and takes a nap each day. He reports that his "jock itch" in bilateral groin area is resolved.  His right toe fungal infection is still resolving.  The patient's skin rash on face, neck, head, and chest is present, but very mild.  The patient denies itching.  The patient is occasionally using the topical creams prescribed. The patient denies diarrhea, constipation, breathing changes, bleeding, or any other problems. MD in to see patient and performed a target exam.  Dr. [name] reviewed the patient's CT scan from 01/22/15 with the patient and advised continuing with protocol treatment.  Per MD, the patient has stable disease, no evidence for disease progression.  The patient agreed and was without any questions. Based on lab results review and physical exam per MD, the patient was cleared for continuation of treatment.  The patient had no questions and verbalized understanding to call for problems or side effects.

## 2015-01-28 NOTE — Progress Notes (Signed)
[place] [phone]   Fax:(336) [phone]  OFFICE PROGRESS NOTE  [name],[name], MD 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 Roxan Hockey on 01/23/2006.  2) Tarceva 150 mg by mouth daily as part of the BMS checkmate 370 clinical trial. Status post 6 weeks of treatment.   CURRENT THERAPY: Tarceva 100 mg by mouth daily as part of the BMS checkmate 370 clinical trial. Started 07/17/2014 Status post 7 months of treatment.  DISEASE STAGE: Recurrent  CHEMOTHERAPY INTENT: Palliative.  CURRENT # OF CHEMOTHERAPY CYCLES: 8  CURRENT ANTIEMETICS: None  CURRENT SMOKING STATUS: Non-smoker  ORAL CHEMOTHERAPY AND CONSENT: None  CURRENT BISPHOSPHONATES USE: None  LIVING WILL AND CODE STATUS: Full code.  INTERVAL HISTORY: [name] 69 y.o. male returns to the clinic today for followup visit accompanied by his wife. The patient is tolerating his current treatment with Tarceva fairly well with no significant adverse effects except for mild skin rash on the face. He denied having any diarrhea. He denied having any significant chest pain, shortness of breath, cough or hemoptysis. The patient denied having any fever or chills, no nausea or vomiting. He denied having any significant weight loss or night sweats. He had repeat CT scan of the chest, abdomen and pelvis performed recently and he is here for evaluation and discussion of his scan results.  MEDICAL HISTORY: Past Medical History  Diagnosis Date  . Hypertension   . Hypercholesterolemia   . Sinus disease     hx of  . History of pneumonia  last 3-4 yrs ago    3 -4  different times  . Hypothyroidism   . Sleep apnea     uses cpap setting opf 12  . History of agent Orange exposure 44-45 yrs ago  . Complication of anesthesia 2007    quit breathing with bronchoscopy, had to spend night  . lung ca dx'd 2007    lung right    ALLERGIES:  is allergic to lisinopril.  MEDICATIONS:  Current Outpatient Prescriptions  Medication Sig Dispense Refill  . amitriptyline (ELAVIL) 50 MG tablet Take 50 mg by mouth at bedtime.    [name] aspirin 81 MG tablet Take 81 mg by mouth every morning.     . cholecalciferol (VITAMIN D) 1000 UNITS tablet Take 2,000 Units by mouth daily.     . clindamycin (CLEOCIN-T) 1 % external solution Apply topically 2 (two) times daily. 240 mL 2  . clindamycin (CLINDAGEL) 1 % gel   1  . diltiazem (CARDIZEM CD) 360 MG 24 hr capsule Take 360 mg by mouth every morning.     . erlotinib (TARCEVA) 100 MG tablet Take 1 tablet (100 mg total) by mouth daily. Take on an empty stomach 1 hour before meals or 2 hours after.    . fluticasone (CUTIVATE) 0.05 % cream Apply 1 application topically.    [name] ketoconazole (NIZORAL) 2 % cream Apply 1 application topically daily.    [name] levETIRAcetam (KEPPRA) 750 MG tablet     . levothyroxine (SYNTHROID, LEVOTHROID) 75 MCG tablet     . metoCLOPramide (REGLAN) 10 MG tablet Take 10 mg by mouth every 6 (six) hours as  needed for nausea.     . metoprolol succinate (TOPROL-XL) 25 MG 24 hr tablet Take 25 mg by mouth every morning.     . Multiple Vitamins-Minerals (CENTRUM SILVER PO) Take 1 tablet by mouth daily.     . naproxen sodium (ANAPROX) 550 MG tablet Take 550 mg by mouth 3 (three) times daily as needed for moderate pain.     . nitroGLYCERIN (NITROSTAT) 0.4 MG SL tablet Place 0.4 mg under the tongue every 5 (five) minutes as needed for chest pain.     . polyethylene glycol (MIRALAX / GLYCOLAX) packet Take 17 g by mouth daily.    . psyllium (METAMUCIL) 58.6 % powder Take 1 packet by mouth 3 (three) times daily.    .  rosuvastatin (CRESTOR) 5 MG tablet Take 5 mg by mouth every morning.     . Terbinafine HCl (LAMISIL AT JOCK ITCH EX) Apply 1 drop topically. Apply as directed to nails. Mix with DMSO 30 ml    . valsartan-hydrochlorothiazide (DIOVAN-HCT) 320-25 MG per tablet Take 1 tablet by mouth every morning.      No current facility-administered medications for this visit.    SURGICAL HISTORY:  Past Surgical History  Procedure Laterality Date  . Cystourethroscopy, gyrus turp.  04/16/2010  . Torn medial and lateral menisci, left knee.  11/06/2006  . Right video-assisted thoracoscopy, right upper lobectomy and  01/23/2006  . The olympus video bronchoscope was introduced via the right  12/21/2005  . Nasal sinus surgery  1987  . Tonsillectomy  1957  . Colonoscopy with propofol N/A 03/19/2013    Procedure: COLONOSCOPY WITH PROPOFOL;  Surgeon: Garlan Fair, MD;  Location: [facility];  Service: Endoscopy;  Laterality: N/A;    REVIEW OF SYSTEMS:  Constitutional: negative Eyes: negative Ears, nose, mouth, throat, and face: negative Respiratory: negative Cardiovascular: negative Gastrointestinal: negative Genitourinary:negative Integument/breast: positive for rash Hematologic/lymphatic: negative Musculoskeletal:negative Neurological: negative Behavioral/Psych: negative Endocrine: negative Allergic/Immunologic: negative   PHYSICAL EXAMINATION: General appearance: alert, cooperative and no distress Head: Normocephalic, without obvious abnormality, atraumatic Neck: no adenopathy Lymph nodes: Cervical, supraclavicular, and axillary nodes normal. Resp: clear to auscultation bilaterally Back: symmetric, no curvature. ROM normal. No CVA tenderness. Cardio: regular rate and rhythm, S1, S2 normal, no murmur, click, rub or gallop GI: soft, non-tender; bowel sounds normal; no masses,  no organomegaly Extremities: extremities normal, atraumatic, no cyanosis or edema Neurologic: Alert and oriented X 3,  normal strength and tone. Normal symmetric reflexes. Normal coordination and gait    ECOG PERFORMANCE STATUS: 1 - Symptomatic but completely ambulatory  Blood pressure 127/80, pulse 67, temperature 97.6 F (36.4 C), temperature source Oral, resp. rate 18, height 6' (1.829 m), weight 241 lb 6.4 oz (109.498 kg), SpO2 100 %.  LABORATORY DATA: Lab Results  Component Value Date   WBC 6.5 01/28/2015   HGB 14.1 01/28/2015   HCT 41.6 01/28/2015   MCV 98.5* 01/28/2015   PLT 222 01/28/2015      Chemistry      Component Value Date/Time   NA 141 01/15/2015 0918   NA 138 07/17/2014 1000   NA 139 10/20/2011 0903   K 3.7 01/15/2015 0918   K 3.3* 07/17/2014 1000   K 3.4 10/20/2011 0903   CL 107 07/17/2014 1000   CL 102 04/24/2012 0853   CL 101 10/20/2011 0903   CO2 30* 01/15/2015 0918   CO2 26 07/17/2014 1000   CO2 29 10/20/2011 0903   BUN 16.2 01/15/2015 0918   BUN 16  07/17/2014 1000   BUN 12 10/20/2011 0903   CREATININE 1.0 01/15/2015 0918   CREATININE 0.82 07/17/2014 1000   CREATININE 0.9 10/20/2011 0903      Component Value Date/Time   CALCIUM 9.5 01/15/2015 0918   CALCIUM 8.9 07/17/2014 1000   CALCIUM 9.0 10/20/2011 0903   ALKPHOS 62 01/15/2015 0918   ALKPHOS 53 07/17/2014 1000   ALKPHOS 52 10/20/2011 0903   AST 29 01/15/2015 0918   AST 24 07/17/2014 1000   AST 24 10/20/2011 0903   ALT 31 01/15/2015 0918   ALT 28 07/17/2014 1000   ALT 31 10/20/2011 0903   BILITOT 0.59 01/15/2015 0918   BILITOT 0.8 07/17/2014 1000   BILITOT 0.60 10/20/2011 0903       RADIOGRAPHIC STUDIES: Ct Chest W Contrast  01/22/2015  CLINICAL DATA:  Non-small cell lung cancer. EXAM: CT CHEST, ABDOMEN, AND PELVIS WITH CONTRAST TECHNIQUE: Multidetector CT imaging of the chest, abdomen and pelvis was performed following the standard protocol during bolus administration of intravenous contrast. CONTRAST:  100mL OMNIPAQUE IOHEXOL 300 MG/ML  SOLN COMPARISON:  11/25/2014 RECIST 1.1: TARGET LESIONS: 1.  Right lower lobe lesion measures 1.8 cm, image 22 of series 5. Previously 2.2 cm 2. Right lower lobe lung lesion measures 1.6 cm, image 25 of series 5. Previously 1.6 cm. NON TARGET LESIONS: 1. Right lower lobe lung nodule measures 6 mm, image 26 of series 4. Previously this measured the same. FINDINGS: CT CHEST FINDINGS Mediastinum/Nodes: Heart size is normal. No pericardial effusion. Aortic atherosclerosis noted. The trachea is patent and is midline. Normal appearance of the esophagus. No mediastinal or hilar adenopathy identified. No axillary or supraclavicular adenopathy. Lungs/Pleura: No pleural fluid. Stable postoperative changes involving the right upper lung zone. No change in nodular densities within the right lower lobe anteriorly as described above. No new pulmonary nodules. Musculoskeletal: There are no aggressive lytic or sclerotic bone lesions. CT ABDOMEN PELVIS FINDINGS Hepatobiliary: Gallstone identified. Stone measures 2.6 cm. No gallbladder wall thickening. No biliary dilatation. Pancreas: Unremarkable appearance of the pancreas. Spleen: The spleen is negative. Adrenals/Urinary Tract: Normal appearance of the adrenal glands. The left kidney is normal. Normal appearance of the right kidney. Urinary bladder appears normal. Stomach/Bowel: The stomach is normal. The small bowel loops have a normal course and caliber without obstruction. The appendix is visualized and appears normal. Normal appearance of the colon. Vascular/Lymphatic: Normal appearance of the abdominal aorta. No enlarged retroperitoneal or mesenteric adenopathy. No enlarged pelvic or inguinal lymph nodes. Reproductive: The prostate gland and seminal vesicles appear normal. Other: There is no ascites or focal fluid collections within the abdomen or pelvis. Musculoskeletal: No aggressive lytic or sclerotic bone lesions identified. IMPRESSION: 1. Stable exam compared with 11/25/2014. 2. Nodular densities within the right lower lobe are not  significantly changed in the interval as described above. Electronically Signed   By: Taylor  Stroud M.D.   On: 01/22/2015 11:15   Ct Abdomen Pelvis W Contrast  01/22/2015  CLINICAL DATA:  Non-small cell lung cancer. EXAM: CT CHEST, ABDOMEN, AND PELVIS WITH CONTRAST TECHNIQUE: Multidetector CT imaging of the chest, abdomen and pelvis was performed following the standard protocol during bolus administration of intravenous contrast. CONTRAST:  100mL OMNIPAQUE IOHEXOL 300 MG/ML  SOLN COMPARISON:  11/25/2014 RECIST 1.1: TARGET LESIONS: 1. Right lower lobe lesion measures 1.8 cm, image 22 of series 5. Previously 2.2 cm 2. Right lower lobe lung lesion measures 1.6 cm, image 25 of series 5. Previously 1.6 cm. NON TARGET LESIONS: 1.   Right lower lobe lung nodule measures 6 mm, image 26 of series 4. Previously this measured the same. FINDINGS: CT CHEST FINDINGS Mediastinum/Nodes: Heart size is normal. No pericardial effusion. Aortic atherosclerosis noted. The trachea is patent and is midline. Normal appearance of the esophagus. No mediastinal or hilar adenopathy identified. No axillary or supraclavicular adenopathy. Lungs/Pleura: No pleural fluid. Stable postoperative changes involving the right upper lung zone. No change in nodular densities within the right lower lobe anteriorly as described above. No new pulmonary nodules. Musculoskeletal: There are no aggressive lytic or sclerotic bone lesions. CT ABDOMEN PELVIS FINDINGS Hepatobiliary: Gallstone identified. Stone measures 2.6 cm. No gallbladder wall thickening. No biliary dilatation. Pancreas: Unremarkable appearance of the pancreas. Spleen: The spleen is negative. Adrenals/Urinary Tract: Normal appearance of the adrenal glands. The left kidney is normal. Normal appearance of the right kidney. Urinary bladder appears normal. Stomach/Bowel: The stomach is normal. The small bowel loops have a normal course and caliber without obstruction. The appendix is visualized and  appears normal. Normal appearance of the colon. Vascular/Lymphatic: Normal appearance of the abdominal aorta. No enlarged retroperitoneal or mesenteric adenopathy. No enlarged pelvic or inguinal lymph nodes. Reproductive: The prostate gland and seminal vesicles appear normal. Other: There is no ascites or focal fluid collections within the abdomen or pelvis. Musculoskeletal: No aggressive lytic or sclerotic bone lesions identified. IMPRESSION: 1. Stable exam compared with 11/25/2014. 2. Nodular densities within the right lower lobe are not significantly changed in the interval as described above. Electronically Signed   By: Taylor  Stroud M.D.   On: 01/22/2015 11:15   ASSESSMENT AND PLAN: This is a very pleasant 69 years old white male with recurrent non-small cell lung cancer, adenocarcinoma with positive EGFR mutation in exon 21 that was initially diagnosed as stage IA non-small cell lung cancer status post right upper lobectomy and has been observation since October of 2007. He is status post 6 weeks treatment with Tarceva 150 mg by mouth daily according to the BMS checkmate 370 clinical trial. He is currently on Tarceva 100 mg by mouth daily for the last 7 months and tolerating it fairly well.  Recent CT scan of the chest, abdomen and pelvis showed no evidence for disease progression. I discussed the scan results with the patient and his wife and recommended for him to continue treatment was Tarceva 100 mg by mouth daily. I will send refill of his medication to [place] outpatient pharmacy He would come back for follow-up visit in 2 weeks for reevaluation according to the study protocol. The patient was advised to call immediately if he has any other concerning symptoms in the interval.  All questions were answered. The patient knows to call the clinic with any problems, questions or concerns. We can certainly see the patient much sooner if necessary.  Disclaimer: This note was dictated with  voice recognition software. Similar sounding words can inadvertently be transcribed and may not be corrected upon review.        

## 2015-02-11 ENCOUNTER — Encounter: Payer: Self-pay | Admitting: Internal Medicine

## 2015-02-11 ENCOUNTER — Encounter: Payer: Self-pay | Admitting: *Deleted

## 2015-02-11 DIAGNOSIS — C3431 Malignant neoplasm of lower lobe, right bronchus or lung: Secondary | ICD-10-CM

## 2015-02-11 MED ORDER — ERLOTINIB HCL 100 MG PO TABS: 100.0000 mg | ORAL_TABLET | Freq: Every day | ORAL | Status: DC

## 2015-02-12 ENCOUNTER — Other Ambulatory Visit (HOSPITAL_BASED_OUTPATIENT_CLINIC_OR_DEPARTMENT_OTHER): Payer: Medicare Other

## 2015-02-12 ENCOUNTER — Ambulatory Visit (HOSPITAL_BASED_OUTPATIENT_CLINIC_OR_DEPARTMENT_OTHER): Payer: Medicare Other | Admitting: Internal Medicine

## 2015-02-12 ENCOUNTER — Encounter: Payer: Self-pay | Admitting: *Deleted

## 2015-02-12 ENCOUNTER — Encounter: Payer: Self-pay | Admitting: Internal Medicine

## 2015-02-12 ENCOUNTER — Other Ambulatory Visit (HOSPITAL_COMMUNITY)
Admission: RE | Admit: 2015-02-12 | Discharge: 2015-02-12 | Disposition: A | Discharge status: Home / Self Care | Payer: Medicare Other | Source: Ambulatory Visit | Attending: Internal Medicine | Admitting: Internal Medicine

## 2015-02-12 ENCOUNTER — Encounter: Payer: Self-pay | Admitting: Oncology

## 2015-02-12 ENCOUNTER — Other Ambulatory Visit: Payer: Self-pay | Admitting: *Deleted

## 2015-02-12 VITALS — BP 123/81 | HR 75 | Temp 97.6°F | Resp 17 | Ht 72.0 in | Wt 241.2 lb

## 2015-02-12 DIAGNOSIS — C3431 Malignant neoplasm of lower lobe, right bronchus or lung: Secondary | ICD-10-CM

## 2015-02-12 DIAGNOSIS — Z006 Encounter for examination for normal comparison and control in clinical research program: Secondary | ICD-10-CM

## 2015-02-12 DIAGNOSIS — C3491 Malignant neoplasm of unspecified part of right bronchus or lung: Secondary | ICD-10-CM | POA: Diagnosis not present

## 2015-02-12 DIAGNOSIS — Z79899 Other long term (current) drug therapy: Secondary | ICD-10-CM | POA: Insufficient documentation

## 2015-02-12 DIAGNOSIS — C3490 Malignant neoplasm of unspecified part of unspecified bronchus or lung: Secondary | ICD-10-CM | POA: Diagnosis present

## 2015-02-12 LAB — TSH CHCC: TSH: 3.083 m(IU)/L (ref 0.320–4.118)

## 2015-02-12 LAB — CBC WITH DIFFERENTIAL/PLATELET
BASO%: 0.5 % (ref 0.0–2.0)
BASOS ABS: 0 10*3/uL (ref 0.0–0.1)
EOS ABS: 0.2 10*3/uL (ref 0.0–0.5)
EOS%: 3.3 % (ref 0.0–7.0)
HCT: 40.9 % (ref 38.4–49.9)
HEMOGLOBIN: 14 g/dL (ref 13.0–17.1)
LYMPH%: 35 % (ref 14.0–49.0)
MCH: 33.7 pg — ABNORMAL HIGH (ref 27.2–33.4)
MCHC: 34.2 g/dL (ref 32.0–36.0)
MCV: 98.6 fL — AB (ref 79.3–98.0)
MONO#: 0.6 10*3/uL (ref 0.1–0.9)
MONO%: 10.3 % (ref 0.0–14.0)
NEUT%: 50.9 % (ref 39.0–75.0)
NEUTROS ABS: 3.1 10*3/uL (ref 1.5–6.5)
PLATELETS: 206 10*3/uL (ref 140–400)
RBC: 4.15 10*6/uL — AB (ref 4.20–5.82)
RDW: 13.4 % (ref 11.0–14.6)
WBC: 6.2 10*3/uL (ref 4.0–10.3)
lymph#: 2.2 10*3/uL (ref 0.9–3.3)

## 2015-02-12 LAB — COMPREHENSIVE METABOLIC PANEL (CC13)
ALBUMIN: 4 g/dL (ref 3.5–5.0)
ALK PHOS: 61 U/L (ref 40–150)
ALT: 32 U/L (ref 0–55)
AST: 30 U/L (ref 5–34)
Anion Gap: 8 mEq/L (ref 3–11)
BUN: 17 mg/dL (ref 7.0–26.0)
CO2: 26 meq/L (ref 22–29)
Calcium: 9.6 mg/dL (ref 8.4–10.4)
Chloride: 105 mEq/L (ref 98–109)
Creatinine: 1 mg/dL (ref 0.7–1.3)
EGFR: 74 mL/min/{1.73_m2} — AB (ref >90)
GLUCOSE: 103 mg/dL (ref 70–140)
POTASSIUM: 3.6 meq/L (ref 3.5–5.1)
SODIUM: 139 meq/L (ref 136–145)
Total Bilirubin: 0.67 mg/dL (ref 0.20–1.20)
Total Protein: 6.5 g/dL (ref 6.4–8.3)

## 2015-02-12 LAB — PHOSPHORUS: PHOSPHORUS: 2.9 mg/dL (ref 2.5–4.6)

## 2015-02-12 LAB — MAGNESIUM (CC13): Magnesium: 2.4 mg/dl (ref 1.5–2.5)

## 2015-02-12 LAB — LACTATE DEHYDROGENASE (CC13): LDH: 196 U/L (ref 125–245)

## 2015-02-12 NOTE — Progress Notes (Signed)
[place] Telephone:(336) [phone]   Fax:(336) [phone]  OFFICE PROGRESS NOTE  [name],[name], MD 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 Roxan Hockey on 01/23/2006.  2) Tarceva 150 mg by mouth daily as part of the BMS checkmate 370 clinical trial. Status post 6 weeks of treatment.   CURRENT THERAPY: Tarceva 100 mg by mouth daily as part of the BMS checkmate 370 clinical trial. Started 07/17/2014 Status post 7 months of treatment.  DISEASE STAGE: Recurrent  CHEMOTHERAPY INTENT: Palliative.  CURRENT # OF CHEMOTHERAPY CYCLES: 8  CURRENT ANTIEMETICS: None  CURRENT SMOKING STATUS: Non-smoker  ORAL CHEMOTHERAPY AND CONSENT: None  CURRENT BISPHOSPHONATES USE: None  LIVING WILL AND CODE STATUS: Full code.  INTERVAL HISTORY: [name] 69 y.o. male returns to the clinic today for followup visit accompanied by his wife. The patient is tolerating his current treatment with Tarceva fairly well with no significant adverse effects. The skin rash on the face has much improved. He denied having any diarrhea. He denied having any significant chest pain, shortness of breath, cough or hemoptysis. The patient denied having any fever or chills, no nausea or vomiting. He denied having any significant weight loss or night sweats. He has repeat CBC and complains metabolic panel performed earlier today and he is here for evaluation and discussion of his lab results.  MEDICAL HISTORY: Past Medical History  Diagnosis Date  . Hypertension   . Hypercholesterolemia   . Sinus disease     hx of  . History of pneumonia  last 3-4 yrs ago    3 -4  different times  . Hypothyroidism   . Sleep apnea     uses cpap setting opf 12  . History of agent Orange exposure 44-45 yrs ago  . Complication of anesthesia 2007    quit breathing with bronchoscopy, had to spend night  . lung ca dx'd 2007    lung right    ALLERGIES:  is allergic to lisinopril.  MEDICATIONS:  Current Outpatient Prescriptions  Medication Sig Dispense Refill  . amitriptyline (ELAVIL) 50 MG tablet Take 50 mg by mouth at bedtime.    [name] aspirin 81 MG tablet Take 81 mg by mouth every morning.     . cholecalciferol (VITAMIN D) 1000 UNITS tablet Take 2,000 Units by mouth daily.     . clindamycin (CLEOCIN-T) 1 % external solution Apply topically 2 (two) times daily. 240 mL 2  . clindamycin (CLINDAGEL) 1 % gel   1  . diltiazem (CARDIZEM CD) 360 MG 24 hr capsule Take 360 mg by mouth every morning.     . erlotinib (TARCEVA) 100 MG tablet Take 1 tablet (100 mg total) by mouth daily. Take on an empty stomach 1 hour before meals or 2 hours after. 30 tablet 2  . fluticasone (CUTIVATE) 0.05 % cream Apply 1 application topically.    [name] ketoconazole (NIZORAL) 2 % cream Apply 1 application topically daily.    [name] levETIRAcetam (KEPPRA) 750 MG tablet     . levothyroxine (SYNTHROID, LEVOTHROID) 75 MCG tablet     . metoCLOPramide (REGLAN) 10 MG tablet Take 10 mg by mouth every 6 (six) hours as  needed for nausea.     . metoprolol succinate (TOPROL-XL) 25 MG 24 hr tablet Take 25 mg by mouth every morning.     . Multiple Vitamins-Minerals (CENTRUM SILVER PO) Take 1 tablet by mouth daily.     . naproxen sodium (ANAPROX) 550 MG tablet Take 550 mg by mouth 3 (three) times daily as needed for moderate pain.     . nitroGLYCERIN (NITROSTAT) 0.4 MG SL tablet Place 0.4 mg under the tongue every 5 (five) minutes as needed for chest pain.     . polyethylene glycol (MIRALAX / GLYCOLAX) packet Take 17 g by mouth daily.    . psyllium (METAMUCIL) 58.6 % powder Take 1 packet by mouth 3 (three) times daily.      . rosuvastatin (CRESTOR) 5 MG tablet Take 5 mg by mouth every morning.     . Terbinafine HCl (LAMISIL AT JOCK ITCH EX) Apply 1 drop topically. Apply as directed to nails. Mix with DMSO 30 ml    . valsartan-hydrochlorothiazide (DIOVAN-HCT) 320-25 MG per tablet Take 1 tablet by mouth every morning.      No current facility-administered medications for this visit.    SURGICAL HISTORY:  Past Surgical History  Procedure Laterality Date  . Cystourethroscopy, gyrus turp.  04/16/2010  . Torn medial and lateral menisci, left knee.  11/06/2006  . Right video-assisted thoracoscopy, right upper lobectomy and  01/23/2006  . The olympus video bronchoscope was introduced via the right  12/21/2005  . Nasal sinus surgery  1987  . Tonsillectomy  1957  . Colonoscopy with propofol N/A 03/19/2013    Procedure: COLONOSCOPY WITH PROPOFOL;  Surgeon: Garlan Fair, MD;  Location: [facility];  Service: Endoscopy;  Laterality: N/A;    REVIEW OF SYSTEMS:  Constitutional: negative Eyes: negative Ears, nose, mouth, throat, and face: negative Respiratory: negative Cardiovascular: negative Gastrointestinal: negative Genitourinary:negative Integument/breast: positive for rash Hematologic/lymphatic: negative Musculoskeletal:negative Neurological: negative Behavioral/Psych: negative Endocrine: negative Allergic/Immunologic: negative   PHYSICAL EXAMINATION: General appearance: alert, cooperative and no distress Head: Normocephalic, without obvious abnormality, atraumatic Neck: no adenopathy Lymph nodes: Cervical, supraclavicular, and axillary nodes normal. Resp: clear to auscultation bilaterally Back: symmetric, no curvature. ROM normal. No CVA tenderness. Cardio: regular rate and rhythm, S1, S2 normal, no murmur, click, rub or gallop GI: soft, non-tender; bowel sounds normal; no masses,  no organomegaly Extremities: extremities normal, atraumatic, no cyanosis or edema Neurologic: Alert and oriented X  3, normal strength and tone. Normal symmetric reflexes. Normal coordination and gait    ECOG PERFORMANCE STATUS: 1 - Symptomatic but completely ambulatory  Blood pressure 123/81, pulse 75, temperature 97.6 F (36.4 C), temperature source Oral, resp. rate 17, height 6' (1.829 m), weight 241 lb 3.2 oz (109.408 kg), SpO2 99 %.  LABORATORY DATA: Lab Results  Component Value Date   WBC 6.2 02/12/2015   HGB 14.0 02/12/2015   HCT 40.9 02/12/2015   MCV 98.6* 02/12/2015   PLT 206 02/12/2015      Chemistry      Component Value Date/Time   NA 139 02/12/2015 0843   NA 138 07/17/2014 1000   NA 139 10/20/2011 0903   K 3.6 02/12/2015 0843   K 3.3* 07/17/2014 1000   K 3.4 10/20/2011 0903   CL 107 07/17/2014 1000   CL 102 04/24/2012 0853   CL 101 10/20/2011 0903   CO2 26 02/12/2015 0843   CO2 26 07/17/2014 1000   CO2 29 10/20/2011 0903   BUN 17.0 02/12/2015 0843   BUN  16 07/17/2014 1000   BUN 12 10/20/2011 0903   CREATININE 1.0 02/12/2015 0843   CREATININE 0.82 07/17/2014 1000   CREATININE 0.9 10/20/2011 0903      Component Value Date/Time   CALCIUM 9.6 02/12/2015 0843   CALCIUM 8.9 07/17/2014 1000   CALCIUM 9.0 10/20/2011 0903   ALKPHOS 61 02/12/2015 0843   ALKPHOS 53 07/17/2014 1000   ALKPHOS 52 10/20/2011 0903   AST 30 02/12/2015 0843   AST 24 07/17/2014 1000   AST 24 10/20/2011 0903   ALT 32 02/12/2015 0843   ALT 28 07/17/2014 1000   ALT 31 10/20/2011 0903   BILITOT 0.67 02/12/2015 0843   BILITOT 0.8 07/17/2014 1000   BILITOT 0.60 10/20/2011 0903       RADIOGRAPHIC STUDIES:  ASSESSMENT AND PLAN: This is a very pleasant 69 years old white male with recurrent non-small cell lung cancer, adenocarcinoma with positive EGFR mutation in exon 21 that was initially diagnosed as stage IA non-small cell lung cancer status post right upper lobectomy and has been observation since October of 2007. He is status post 6 weeks treatment with Tarceva 150 mg by mouth daily according to  the BMS checkmate 370 clinical trial. He is currently on Tarceva 100 mg by mouth daily for the last 7 months and tolerating it fairly well.  I discussed the scan results with the patient and his wife and recommended for him to continue treatment with Tarceva 100 mg by mouth daily. He would come back for follow-up visit in 2 weeks for reevaluation according to the study protocol. The patient was advised to call immediately if he has any other concerning symptoms in the interval.  All questions were answered. The patient knows to call the clinic with any problems, questions or concerns. We can certainly see the patient much sooner if necessary.  Disclaimer: This note was dictated with voice recognition software. Similar sounding words can inadvertently be transcribed and may not be corrected upon review.

## 2015-02-12 NOTE — Progress Notes (Signed)
02/12/2015 0925  BMS 370 Group D, cycle 19 Patient arrives to clinic accompanied by his wife.  He completed the PROs and had labs drawn.  Resting vital signs taken, including O2 sat. The patient denies any changes since his last visit, including diarrhea, constipation, worsening rash, eye/vision changes, breathing changes, or other issues.  He continues with mild rash on head, chest, neck, and back that is intermittent.  He has mild fatigue.  He continues with intermittent dyspnea on exertion.  He stated his right toe nail infection continues to improve. Based on lab results review and clinical exam by Dr. [name], the patient was cleared to continued treatment per protocol. The patient denies any questions and understands to call if something changes.

## 2015-02-12 NOTE — Progress Notes (Signed)
02/12/15 - BMS [id number] - Questionnaires (PRO's) - Patient into the cancer center for routine visit.  Patient given PRO's upon arrival to the cancer center. The patient completed his PRO's ([id number] first and then the FACT-L) before any study procedures were performed.  I checked the PRO's for completeness.  The patient was thanked for his continued support in this clinical trial. [name] 02/12/15 - 8:30 am

## 2015-02-13 ENCOUNTER — Telehealth: Payer: Self-pay | Admitting: Internal Medicine

## 2015-02-13 NOTE — Telephone Encounter (Signed)
Patient already on schedule for lab/fu in 2 weeks. No other orders per 11/10 pof.

## 2015-02-16 DIAGNOSIS — G43111 Migraine with aura, intractable, with status migrainosus: Secondary | ICD-10-CM | POA: Diagnosis not present

## 2015-02-16 DIAGNOSIS — G43019 Migraine without aura, intractable, without status migrainosus: Secondary | ICD-10-CM | POA: Diagnosis not present

## 2015-02-16 DIAGNOSIS — R51 Headache: Secondary | ICD-10-CM | POA: Diagnosis not present

## 2015-02-16 DIAGNOSIS — Z79899 Other long term (current) drug therapy: Secondary | ICD-10-CM | POA: Diagnosis not present

## 2015-02-24 ENCOUNTER — Encounter: Payer: Self-pay | Admitting: Internal Medicine

## 2015-02-24 MED ORDER — ERLOTINIB HCL 100 MG PO TABS: 100.0000 mg | ORAL_TABLET | Freq: Every day | ORAL | Status: DC

## 2015-02-25 ENCOUNTER — Other Ambulatory Visit (HOSPITAL_BASED_OUTPATIENT_CLINIC_OR_DEPARTMENT_OTHER): Payer: Medicare Other

## 2015-02-25 ENCOUNTER — Ambulatory Visit (HOSPITAL_BASED_OUTPATIENT_CLINIC_OR_DEPARTMENT_OTHER): Payer: Medicare Other | Admitting: Internal Medicine

## 2015-02-25 ENCOUNTER — Encounter: Payer: Self-pay | Admitting: *Deleted

## 2015-02-25 ENCOUNTER — Encounter: Payer: Self-pay | Admitting: Internal Medicine

## 2015-02-25 ENCOUNTER — Other Ambulatory Visit (HOSPITAL_COMMUNITY)
Admission: RE | Admit: 2015-02-25 | Discharge: 2015-02-25 | Disposition: A | Discharge status: Home / Self Care | Payer: Medicare Other | Source: Ambulatory Visit | Attending: Internal Medicine | Admitting: Internal Medicine

## 2015-02-25 VITALS — BP 124/80 | HR 67 | Temp 98.4°F | Resp 18 | Ht 72.0 in | Wt 242.8 lb

## 2015-02-25 DIAGNOSIS — C3431 Malignant neoplasm of lower lobe, right bronchus or lung: Secondary | ICD-10-CM

## 2015-02-25 DIAGNOSIS — C3491 Malignant neoplasm of unspecified part of right bronchus or lung: Secondary | ICD-10-CM

## 2015-02-25 DIAGNOSIS — Z006 Encounter for examination for normal comparison and control in clinical research program: Secondary | ICD-10-CM | POA: Diagnosis not present

## 2015-02-25 DIAGNOSIS — Z79899 Other long term (current) drug therapy: Secondary | ICD-10-CM

## 2015-02-25 LAB — CBC WITH DIFFERENTIAL/PLATELET
BASO%: 0.7 % (ref 0.0–2.0)
Basophils Absolute: 0 10*3/uL (ref 0.0–0.1)
EOS ABS: 0.2 10*3/uL (ref 0.0–0.5)
EOS%: 3.6 % (ref 0.0–7.0)
HCT: 39.2 % (ref 38.4–49.9)
HEMOGLOBIN: 13.2 g/dL (ref 13.0–17.1)
LYMPH%: 40.4 % (ref 14.0–49.0)
MCH: 33.9 pg — ABNORMAL HIGH (ref 27.2–33.4)
MCHC: 33.8 g/dL (ref 32.0–36.0)
MCV: 100 fL — AB (ref 79.3–98.0)
MONO#: 0.6 10*3/uL (ref 0.1–0.9)
MONO%: 9.7 % (ref 0.0–14.0)
NEUT%: 45.6 % (ref 39.0–75.0)
NEUTROS ABS: 2.8 10*3/uL (ref 1.5–6.5)
Platelets: 215 10*3/uL (ref 140–400)
RBC: 3.91 10*6/uL — AB (ref 4.20–5.82)
RDW: 13.2 % (ref 11.0–14.6)
WBC: 6.1 10*3/uL (ref 4.0–10.3)
lymph#: 2.5 10*3/uL (ref 0.9–3.3)

## 2015-02-25 LAB — COMPREHENSIVE METABOLIC PANEL (CC13)
ALT: 36 U/L (ref 0–55)
ANION GAP: 8 meq/L (ref 3–11)
AST: 38 U/L — ABNORMAL HIGH (ref 5–34)
Albumin: 3.8 g/dL (ref 3.5–5.0)
Alkaline Phosphatase: 60 U/L (ref 40–150)
BUN: 19.3 mg/dL (ref 7.0–26.0)
CALCIUM: 9.5 mg/dL (ref 8.4–10.4)
CHLORIDE: 104 meq/L (ref 98–109)
CO2: 28 meq/L (ref 22–29)
Creatinine: 1 mg/dL (ref 0.7–1.3)
EGFR: 74 mL/min/{1.73_m2} — AB (ref >90)
Glucose: 98 mg/dl (ref 70–140)
Potassium: 3.8 mEq/L (ref 3.5–5.1)
Sodium: 140 mEq/L (ref 136–145)
Total Bilirubin: 0.5 mg/dL (ref 0.20–1.20)
Total Protein: 6.3 g/dL — ABNORMAL LOW (ref 6.4–8.3)

## 2015-02-25 LAB — LACTATE DEHYDROGENASE (CC13): LDH: 204 U/L (ref 125–245)

## 2015-02-25 LAB — MAGNESIUM (CC13): MAGNESIUM: 2.3 mg/dL (ref 1.5–2.5)

## 2015-02-25 LAB — PHOSPHORUS: PHOSPHORUS: 3.1 mg/dL (ref 2.5–4.6)

## 2015-02-25 NOTE — Progress Notes (Signed)
02/25/2015 0900 BMS 370  Week 38 The patient returns to clinic accompanied by his wife. Resting vital signs and O2 sat were stable. Weight remains stable.   He continues with a grade 1 rash over head, chest, neck, and back.  He reports that he continues using the prescribed creams for the rash. He denies having any changes in his breathing.  His dyspnea on exertion remains the same.  His fatigue level remains the same.  He denies diarrhea, constipation, or any other complaints.  He continues to travel to football games.   Dr. [name] performed a targeted physical exam.  Per Dr. [name], abnormal labs are not clinically significant.  He was cleared by MD to continue with treatment per protocol. The patient understands to call for questions or concerns.

## 2015-02-25 NOTE — Progress Notes (Signed)
[place] Telephone:(336) [phone]   Fax:([phone]  OFFICE PROGRESS NOTE  [name],[name], MD 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 Roxan Hockey on 01/23/2006.  2) Tarceva 150 mg by mouth daily as part of the BMS checkmate 370 clinical trial. Status post 6 weeks of treatment.   CURRENT THERAPY: Tarceva 100 mg by mouth daily as part of the BMS checkmate 370 clinical trial. Started 07/17/2014 Status post 7 months of treatment.  DISEASE STAGE: Recurrent  CHEMOTHERAPY INTENT: Palliative.  CURRENT # OF CHEMOTHERAPY CYCLES: 8  CURRENT ANTIEMETICS: None  CURRENT SMOKING STATUS: Non-smoker  ORAL CHEMOTHERAPY AND CONSENT: None  CURRENT BISPHOSPHONATES USE: None  LIVING WILL AND CODE STATUS: Full code.  INTERVAL HISTORY: [name] 69 y.o. male returns to the clinic today for followup visit accompanied by his wife. The patient is tolerating his current treatment with Tarceva fairly well with no significant adverse effects. He had more skin rash after receiving the new patch off Tarceva last month but this resolved after a few days. He denied having any diarrhea. He denied having any significant chest pain, shortness of breath, cough or hemoptysis. The patient denied having any fever or chills, no nausea or vomiting. He denied having any significant weight loss or night sweats. He has repeat CBC and complains metabolic panel performed earlier today and he is here for evaluation and discussion of his lab results.  MEDICAL HISTORY: Past Medical History  Diagnosis Date  . Hypertension   . Hypercholesterolemia   . Sinus disease      hx of  . History of pneumonia  last 3-4 yrs ago    3 -4 different times  . Hypothyroidism   . Sleep apnea     uses cpap setting opf 12  . History of agent Orange exposure 44-45 yrs ago  . Complication of anesthesia 2007    quit breathing with bronchoscopy, had to spend night  . lung ca dx'd 2007    lung right    ALLERGIES:  is allergic to lisinopril.  MEDICATIONS:  Current Outpatient Prescriptions  Medication Sig Dispense Refill  . amitriptyline (ELAVIL) 50 MG tablet Take 50 mg by mouth at bedtime.    [name] aspirin 81 MG tablet Take 81 mg by mouth every morning.     . cholecalciferol (VITAMIN D) 1000 UNITS tablet Take 2,000 Units by mouth daily.     . clindamycin (CLEOCIN-T) 1 % external solution Apply topically 2 (two) times daily. 240 mL 2  . clindamycin (CLINDAGEL) 1 % gel   1  . diltiazem (CARDIZEM CD) 360 MG 24 hr capsule Take 360 mg by mouth every morning.     . fluticasone (CUTIVATE) 0.05 % cream     . levETIRAcetam (KEPPRA) 750 MG tablet     . levothyroxine (SYNTHROID, LEVOTHROID) 75 MCG tablet     . metoCLOPramide (REGLAN) 10 MG tablet Take 10 mg by mouth every 6 (six) hours as needed for nausea.     . metoprolol succinate (TOPROL-XL) 25 MG 24 hr tablet Take 25 mg by mouth every morning.     . Multiple Vitamins-Minerals (CENTRUM SILVER PO) Take 1 tablet by  mouth daily.     . naproxen sodium (ANAPROX) 550 MG tablet Take 550 mg by mouth 3 (three) times daily as needed for moderate pain.     . nitroGLYCERIN (NITROSTAT) 0.4 MG SL tablet Place 0.4 mg under the tongue every 5 (five) minutes as needed for chest pain.     . polyethylene glycol (MIRALAX / GLYCOLAX) packet Take 17 g by mouth daily.    . psyllium (METAMUCIL) 58.6 % powder Take 1 packet by mouth 3 (three) times daily.    . rosuvastatin (CRESTOR) 5 MG tablet Take 5 mg by mouth every morning.     . valsartan-hydrochlorothiazide (DIOVAN-HCT) 320-25 MG per tablet Take 1 tablet by mouth every morning.      No current  facility-administered medications for this visit.    SURGICAL HISTORY:  Past Surgical History  Procedure Laterality Date  . Cystourethroscopy, gyrus turp.  04/16/2010  . Torn medial and lateral menisci, left knee.  11/06/2006  . Right video-assisted thoracoscopy, right upper lobectomy and  01/23/2006  . The olympus video bronchoscope was introduced via the right  12/21/2005  . Nasal sinus surgery  1987  . Tonsillectomy  1957  . Colonoscopy with propofol N/A 03/19/2013    Procedure: COLONOSCOPY WITH PROPOFOL;  Surgeon: Garlan Fair, MD;  Location: [facility];  Service: Endoscopy;  Laterality: N/A;    REVIEW OF SYSTEMS:  Constitutional: negative Eyes: negative Ears, nose, mouth, throat, and face: negative Respiratory: negative Cardiovascular: negative Gastrointestinal: negative Genitourinary:negative Integument/breast: positive for rash Hematologic/lymphatic: negative Musculoskeletal:negative Neurological: negative Behavioral/Psych: negative Endocrine: negative Allergic/Immunologic: negative   PHYSICAL EXAMINATION: General appearance: alert, cooperative and no distress Head: Normocephalic, without obvious abnormality, atraumatic Neck: no adenopathy Lymph nodes: Cervical, supraclavicular, and axillary nodes normal. Resp: clear to auscultation bilaterally Back: symmetric, no curvature. ROM normal. No CVA tenderness. Cardio: regular rate and rhythm, S1, S2 normal, no murmur, click, rub or gallop GI: soft, non-tender; bowel sounds normal; no masses,  no organomegaly Extremities: extremities normal, atraumatic, no cyanosis or edema Neurologic: Alert and oriented X 3, normal strength and tone. Normal symmetric reflexes. Normal coordination and gait    ECOG PERFORMANCE STATUS: 1 - Symptomatic but completely ambulatory  Blood pressure 124/80, pulse 67, temperature 98.4 F (36.9 C), temperature source Oral, resp. rate 18, height 6' (1.829 m), weight 242 lb 12.8 oz (110.133  kg), SpO2 100 %.  LABORATORY DATA: Lab Results  Component Value Date   WBC 6.1 02/25/2015   HGB 13.2 02/25/2015   HCT 39.2 02/25/2015   MCV 100.0* 02/25/2015   PLT 215 02/25/2015      Chemistry      Component Value Date/Time   NA 140 02/25/2015 0823   NA 138 07/17/2014 1000   NA 139 10/20/2011 0903   K 3.8 02/25/2015 0823   K 3.3* 07/17/2014 1000   K 3.4 10/20/2011 0903   CL 107 07/17/2014 1000   CL 102 04/24/2012 0853   CL 101 10/20/2011 0903   CO2 28 02/25/2015 0823   CO2 26 07/17/2014 1000   CO2 29 10/20/2011 0903   BUN 19.3 02/25/2015 0823   BUN 16 07/17/2014 1000   BUN 12 10/20/2011 0903   CREATININE 1.0 02/25/2015 0823   CREATININE 0.82 07/17/2014 1000   CREATININE 0.9 10/20/2011 0903      Component Value Date/Time   CALCIUM 9.5 02/25/2015 0823   CALCIUM 8.9 07/17/2014 1000   CALCIUM 9.0 10/20/2011 0903   ALKPHOS 60 02/25/2015 0823   ALKPHOS 53  07/17/2014 1000   ALKPHOS 52 10/20/2011 0903   AST 38* 02/25/2015 0823   AST 24 07/17/2014 1000   AST 24 10/20/2011 0903   ALT 36 02/25/2015 0823   ALT 28 07/17/2014 1000   ALT 31 10/20/2011 0903   BILITOT 0.50 02/25/2015 0823   BILITOT 0.8 07/17/2014 1000   BILITOT 0.60 10/20/2011 0903       RADIOGRAPHIC STUDIES:  ASSESSMENT AND PLAN: This is a very pleasant 69 years old white male with recurrent non-small cell lung cancer, adenocarcinoma with positive EGFR mutation in exon 21 that was initially diagnosed as stage IA non-small cell lung cancer status post right upper lobectomy and has been observation since October of 2007. He is status post 6 weeks treatment with Tarceva 150 mg by mouth daily according to the BMS checkmate 370 clinical trial. He is currently on Tarceva 100 mg by mouth daily for the last 7 months and tolerating it fairly well.  I recommended for the patient to continue his current treatment with Tarceva at the same dose. He would come back for follow-up visit in 2 weeks for reevaluation  according to the study protocol. The patient was advised to call immediately if he has any other concerning symptoms in the interval.  All questions were answered. The patient knows to call the clinic with any problems, questions or concerns. We can certainly see the patient much sooner if necessary.  Disclaimer: This note was dictated with voice recognition software. Similar sounding words can inadvertently be transcribed and may not be corrected upon review.

## 2015-03-10 ENCOUNTER — Encounter: Payer: Self-pay | Admitting: Internal Medicine

## 2015-03-10 ENCOUNTER — Encounter: Payer: Self-pay | Admitting: *Deleted

## 2015-03-10 DIAGNOSIS — C3431 Malignant neoplasm of lower lobe, right bronchus or lung: Secondary | ICD-10-CM

## 2015-03-10 MED ORDER — ERLOTINIB HCL 100 MG PO TABS: 100.0000 mg | ORAL_TABLET | Freq: Every day | ORAL | Status: DC

## 2015-03-11 ENCOUNTER — Other Ambulatory Visit: Payer: Self-pay | Admitting: *Deleted

## 2015-03-11 ENCOUNTER — Ambulatory Visit (HOSPITAL_BASED_OUTPATIENT_CLINIC_OR_DEPARTMENT_OTHER): Payer: Medicare Other | Admitting: Internal Medicine

## 2015-03-11 ENCOUNTER — Telehealth: Payer: Self-pay | Admitting: Internal Medicine

## 2015-03-11 ENCOUNTER — Other Ambulatory Visit (HOSPITAL_BASED_OUTPATIENT_CLINIC_OR_DEPARTMENT_OTHER): Payer: Medicare Other

## 2015-03-11 ENCOUNTER — Encounter: Payer: Self-pay | Admitting: Internal Medicine

## 2015-03-11 ENCOUNTER — Other Ambulatory Visit (HOSPITAL_COMMUNITY)
Admission: RE | Admit: 2015-03-11 | Discharge: 2015-03-11 | Disposition: A | Discharge status: Home / Self Care | Payer: Medicare Other | Source: Ambulatory Visit | Attending: Internal Medicine | Admitting: Internal Medicine

## 2015-03-11 VITALS — BP 117/76 | HR 63 | Temp 98.4°F | Resp 18 | Ht 72.0 in | Wt 245.4 lb

## 2015-03-11 DIAGNOSIS — C3491 Malignant neoplasm of unspecified part of right bronchus or lung: Secondary | ICD-10-CM | POA: Diagnosis present

## 2015-03-11 DIAGNOSIS — C3430 Malignant neoplasm of lower lobe, unspecified bronchus or lung: Secondary | ICD-10-CM | POA: Insufficient documentation

## 2015-03-11 DIAGNOSIS — Z006 Encounter for examination for normal comparison and control in clinical research program: Secondary | ICD-10-CM

## 2015-03-11 DIAGNOSIS — Z79899 Other long term (current) drug therapy: Secondary | ICD-10-CM

## 2015-03-11 DIAGNOSIS — C3431 Malignant neoplasm of lower lobe, right bronchus or lung: Secondary | ICD-10-CM

## 2015-03-11 LAB — COMPREHENSIVE METABOLIC PANEL
ALT: 27 U/L (ref 0–55)
ANION GAP: 9 meq/L (ref 3–11)
AST: 28 U/L (ref 5–34)
Albumin: 3.9 g/dL (ref 3.5–5.0)
Alkaline Phosphatase: 58 U/L (ref 40–150)
BILIRUBIN TOTAL: 0.53 mg/dL (ref 0.20–1.20)
BUN: 17.4 mg/dL (ref 7.0–26.0)
CHLORIDE: 105 meq/L (ref 98–109)
CO2: 27 meq/L (ref 22–29)
Calcium: 9.6 mg/dL (ref 8.4–10.4)
Creatinine: 1.1 mg/dL (ref 0.7–1.3)
EGFR: 71 mL/min/{1.73_m2} — AB (ref >90)
GLUCOSE: 88 mg/dL (ref 70–140)
Potassium: 3.6 mEq/L (ref 3.5–5.1)
SODIUM: 140 meq/L (ref 136–145)
TOTAL PROTEIN: 6.7 g/dL (ref 6.4–8.3)

## 2015-03-11 LAB — MAGNESIUM: MAGNESIUM: 2.3 mg/dL (ref 1.5–2.5)

## 2015-03-11 LAB — CBC WITH DIFFERENTIAL/PLATELET
BASO%: 0.5 % (ref 0.0–2.0)
BASOS ABS: 0 10*3/uL (ref 0.0–0.1)
EOS%: 3.3 % (ref 0.0–7.0)
Eosinophils Absolute: 0.2 10*3/uL (ref 0.0–0.5)
HCT: 40.4 % (ref 38.4–49.9)
HGB: 13.5 g/dL (ref 13.0–17.1)
LYMPH%: 38.6 % (ref 14.0–49.0)
MCH: 33.4 pg (ref 27.2–33.4)
MCHC: 33.4 g/dL (ref 32.0–36.0)
MCV: 100 fL — ABNORMAL HIGH (ref 79.3–98.0)
MONO#: 0.6 10*3/uL (ref 0.1–0.9)
MONO%: 10.1 % (ref 0.0–14.0)
NEUT#: 2.9 10*3/uL (ref 1.5–6.5)
NEUT%: 47.5 % (ref 39.0–75.0)
Platelets: 228 10*3/uL (ref 140–400)
RBC: 4.04 10*6/uL — ABNORMAL LOW (ref 4.20–5.82)
RDW: 12.6 % (ref 11.0–14.6)
WBC: 6 10*3/uL (ref 4.0–10.3)
lymph#: 2.3 10*3/uL (ref 0.9–3.3)

## 2015-03-11 LAB — LACTATE DEHYDROGENASE: LDH: 186 U/L (ref 125–245)

## 2015-03-11 LAB — PHOSPHORUS: Phosphorus: 3.3 mg/dL (ref 2.5–4.6)

## 2015-03-11 MED ORDER — ERLOTINIB HCL 100 MG PO TABS: 100.0000 mg | ORAL_TABLET | Freq: Every day | ORAL | Status: DC

## 2015-03-11 NOTE — Telephone Encounter (Signed)
Gave and printed appt sched and avs for pt for DEC and Jan....gave barium °

## 2015-03-11 NOTE — Progress Notes (Signed)
03/11/2015 0915  BMS 370 cycle 21, week 40 The patient arrives with his wife.  He reports having had an uneventful last 2 weeks.  His rash on head, neck, and chest comes and goes.   He says it does not itch.   He uses the prescribed creams on those areas when it flares up.   He denies having any changes in his breathing. His dyspnea on exertion remains the same. His fatigue level remains the same. He denies diarrhea, constipation, or any other complaints. He continues to travel to football games and has travel plans over the holidays.. Dr. [name] performed a targeted physical exam. Per Dr. [name], abnormal labs are not clinically significant.  Per Dr. [name], okay to continue treatment. The patient is aware of upcoming CT scan scheduled next week.  He has no questions or complaints at this time.

## 2015-03-11 NOTE — Progress Notes (Signed)
[place] Telephone:([phone]   Fax:(336) [phone]  OFFICE PROGRESS NOTE  [name],[name], MD 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 Roxan Hockey on 01/23/2006.  2) Tarceva 150 mg by mouth daily as part of the BMS checkmate 370 clinical trial. Status post 6 weeks of treatment.   CURRENT THERAPY: Tarceva 100 mg by mouth daily as part of the BMS checkmate 370 clinical trial. Started 07/17/2014 Status post 8 months of treatment.  DISEASE STAGE: Recurrent  CHEMOTHERAPY INTENT: Palliative.  CURRENT # OF CHEMOTHERAPY CYCLES: 9  CURRENT ANTIEMETICS: None  CURRENT SMOKING STATUS: Non-smoker  ORAL CHEMOTHERAPY AND CONSENT: None  CURRENT BISPHOSPHONATES USE: None  LIVING WILL AND CODE STATUS: Full code.  INTERVAL HISTORY: [name] 69 y.o. male returns to the clinic today for followup visit accompanied by his wife. The patient is feeling fine today with no specific complaints. He denied having any skin rash or diarrhea. He denied having any significant chest pain, shortness of breath, cough or hemoptysis. The patient denied having any fever or chills, no nausea or vomiting. He denied having any significant weight loss or night sweats. He has repeat CBC and complains metabolic panel performed earlier today and he is here for evaluation and discussion of his lab results.  MEDICAL HISTORY: Past Medical History  Diagnosis Date  . Hypertension   . Hypercholesterolemia   . Sinus disease     hx of  . History of pneumonia  last 3-4 yrs ago    3 -4 different times  . Hypothyroidism   . Sleep apnea     uses cpap setting opf 12   . History of agent Orange exposure 44-45 yrs ago  . Complication of anesthesia 2007    quit breathing with bronchoscopy, had to spend night  . lung ca dx'd 2007    lung right    ALLERGIES:  is allergic to lisinopril.  MEDICATIONS:  Current Outpatient Prescriptions  Medication Sig Dispense Refill  . amitriptyline (ELAVIL) 50 MG tablet Take 50 mg by mouth at bedtime.    [name] aspirin 81 MG tablet Take 81 mg by mouth every morning.     . cholecalciferol (VITAMIN D) 1000 UNITS tablet Take 2,000 Units by mouth daily.     . clindamycin (CLEOCIN-T) 1 % external solution Apply topically 2 (two) times daily. 240 mL 2  . clindamycin (CLINDAGEL) 1 % gel   1  . diltiazem (CARDIZEM CD) 360 MG 24 hr capsule Take 360 mg by mouth every morning.     . erlotinib (TARCEVA) 100 MG tablet Take 1 tablet (100 mg total) by mouth daily. Take on an empty stomach 1 hour before meals or 2 hours after.    . fluticasone (CUTIVATE) 0.05 % cream     . levETIRAcetam (KEPPRA) 750 MG tablet     . levothyroxine (SYNTHROID, LEVOTHROID) 75 MCG tablet     . metoCLOPramide (REGLAN) 10 MG tablet Take 10 mg by mouth every 6 (six) hours as needed for nausea.     . metoprolol succinate (TOPROL-XL) 25 MG 24 hr tablet Take 25 mg by mouth every morning.     . Multiple Vitamins-Minerals (  CENTRUM SILVER PO) Take 1 tablet by mouth daily.     . naproxen sodium (ANAPROX) 550 MG tablet Take 550 mg by mouth 3 (three) times daily as needed for moderate pain.     . nitroGLYCERIN (NITROSTAT) 0.4 MG SL tablet Place 0.4 mg under the tongue every 5 (five) minutes as needed for chest pain.     . polyethylene glycol (MIRALAX / GLYCOLAX) packet Take 17 g by mouth daily.    . psyllium (METAMUCIL) 58.6 % powder Take 1 packet by mouth 3 (three) times daily.    . rosuvastatin (CRESTOR) 5 MG tablet Take 5 mg by mouth every morning.     . valsartan-hydrochlorothiazide (DIOVAN-HCT) 320-25 MG per tablet Take 1 tablet by mouth every morning.      No current  facility-administered medications for this visit.    SURGICAL HISTORY:  Past Surgical History  Procedure Laterality Date  . Cystourethroscopy, gyrus turp.  04/16/2010  . Torn medial and lateral menisci, left knee.  11/06/2006  . Right video-assisted thoracoscopy, right upper lobectomy and  01/23/2006  . The olympus video bronchoscope was introduced via the right  12/21/2005  . Nasal sinus surgery  1987  . Tonsillectomy  1957  . Colonoscopy with propofol N/A 03/19/2013    Procedure: COLONOSCOPY WITH PROPOFOL;  Surgeon: Garlan Fair, MD;  Location: [facility];  Service: Endoscopy;  Laterality: N/A;    REVIEW OF SYSTEMS:  A comprehensive review of systems was negative.   PHYSICAL EXAMINATION: General appearance: alert, cooperative and no distress Head: Normocephalic, without obvious abnormality, atraumatic Neck: no adenopathy Lymph nodes: Cervical, supraclavicular, and axillary nodes normal. Resp: clear to auscultation bilaterally Back: symmetric, no curvature. ROM normal. No CVA tenderness. Cardio: regular rate and rhythm, S1, S2 normal, no murmur, click, rub or gallop GI: soft, non-tender; bowel sounds normal; no masses,  no organomegaly Extremities: extremities normal, atraumatic, no cyanosis or edema Neurologic: Alert and oriented X 3, normal strength and tone. Normal symmetric reflexes. Normal coordination and gait    ECOG PERFORMANCE STATUS: 1 - Symptomatic but completely ambulatory  Blood pressure 117/76, pulse 63, temperature 98.4 F (36.9 C), temperature source Oral, resp. rate 18, height 6' (1.829 m), weight 245 lb 6.4 oz (111.313 kg), SpO2 100 %.  LABORATORY DATA: Lab Results  Component Value Date   WBC 6.0 03/11/2015   HGB 13.5 03/11/2015   HCT 40.4 03/11/2015   MCV 100.0* 03/11/2015   PLT 228 03/11/2015      Chemistry      Component Value Date/Time   NA 140 03/11/2015 0811   NA 138 07/17/2014 1000   NA 139 10/20/2011 0903   K 3.6 03/11/2015 0811   K  3.3* 07/17/2014 1000   K 3.4 10/20/2011 0903   CL 107 07/17/2014 1000   CL 102 04/24/2012 0853   CL 101 10/20/2011 0903   CO2 27 03/11/2015 0811   CO2 26 07/17/2014 1000   CO2 29 10/20/2011 0903   BUN 17.4 03/11/2015 0811   BUN 16 07/17/2014 1000   BUN 12 10/20/2011 0903   CREATININE 1.1 03/11/2015 0811   CREATININE 0.82 07/17/2014 1000   CREATININE 0.9 10/20/2011 0903      Component Value Date/Time   CALCIUM 9.6 03/11/2015 0811   CALCIUM 8.9 07/17/2014 1000   CALCIUM 9.0 10/20/2011 0903   ALKPHOS 58 03/11/2015 0811   ALKPHOS 53 07/17/2014 1000   ALKPHOS 52 10/20/2011 0903   AST 28 03/11/2015 0811   AST 24 07/17/2014  1000   AST 24 10/20/2011 0903   ALT 27 03/11/2015 0811   ALT 28 07/17/2014 1000   ALT 31 10/20/2011 0903   BILITOT 0.53 03/11/2015 0811   BILITOT 0.8 07/17/2014 1000   BILITOT 0.60 10/20/2011 9597       RADIOGRAPHIC STUDIES:  ASSESSMENT AND PLAN: This is a very pleasant 69 years old white male with recurrent non-small cell lung cancer, adenocarcinoma with positive EGFR mutation in exon 21 that was initially diagnosed as stage IA non-small cell lung cancer status post right upper lobectomy and has been observation since October of 2007. He is status post 6 weeks treatment with Tarceva 150 mg by mouth daily according to the BMS checkmate 370 clinical trial. He is currently on Tarceva 100 mg by mouth daily for the last 8 months and tolerating it fairly well.  I recommended for the patient to continue his current treatment with Tarceva at the same dose. He would come back for follow-up visit in 2 weeks for reevaluation according to the study protocol after repeating CT scan of the chest for restaging of his disease. The patient was advised to call immediately if he has any other concerning symptoms in the interval.  All questions were answered. The patient knows to call the clinic with any problems, questions or concerns. We can certainly see the patient much  sooner if necessary.  Disclaimer: This note was dictated with voice recognition software. Similar sounding words can inadvertently be transcribed and may not be corrected upon review.

## 2015-03-12 ENCOUNTER — Other Ambulatory Visit: Payer: Medicare Other

## 2015-03-12 ENCOUNTER — Ambulatory Visit: Payer: Medicare Other | Admitting: Internal Medicine

## 2015-03-19 ENCOUNTER — Ambulatory Visit (HOSPITAL_COMMUNITY)
Admission: RE | Admit: 2015-03-19 | Discharge: 2015-03-19 | Disposition: A | Discharge status: Home / Self Care | Payer: Medicare Other | Source: Ambulatory Visit | Attending: Internal Medicine | Admitting: Internal Medicine

## 2015-03-19 ENCOUNTER — Encounter (HOSPITAL_COMMUNITY): Payer: Self-pay

## 2015-03-19 DIAGNOSIS — Z902 Acquired absence of lung [part of]: Secondary | ICD-10-CM | POA: Diagnosis not present

## 2015-03-19 DIAGNOSIS — K8020 Calculus of gallbladder without cholecystitis without obstruction: Secondary | ICD-10-CM | POA: Insufficient documentation

## 2015-03-19 DIAGNOSIS — C3431 Malignant neoplasm of lower lobe, right bronchus or lung: Secondary | ICD-10-CM | POA: Insufficient documentation

## 2015-03-19 DIAGNOSIS — C3491 Malignant neoplasm of unspecified part of right bronchus or lung: Secondary | ICD-10-CM | POA: Diagnosis not present

## 2015-03-19 MED ORDER — IOHEXOL 300 MG/ML  SOLN
100.0000 mL | Freq: Once | INTRAMUSCULAR | Status: AC | PRN
  Administered 2015-03-19: 100 mL via INTRAVENOUS

## 2015-03-25 ENCOUNTER — Encounter: Payer: Self-pay | Admitting: *Deleted

## 2015-03-25 ENCOUNTER — Encounter: Payer: Self-pay | Admitting: Internal Medicine

## 2015-03-25 DIAGNOSIS — Z79899 Other long term (current) drug therapy: Secondary | ICD-10-CM

## 2015-03-26 ENCOUNTER — Ambulatory Visit (HOSPITAL_BASED_OUTPATIENT_CLINIC_OR_DEPARTMENT_OTHER): Payer: Medicare Other | Admitting: Physician Assistant

## 2015-03-26 ENCOUNTER — Other Ambulatory Visit (HOSPITAL_COMMUNITY)
Admission: RE | Admit: 2015-03-26 | Discharge: 2015-03-26 | Disposition: A | Discharge status: Home / Self Care | Payer: Medicare Other | Source: Ambulatory Visit | Attending: Internal Medicine | Admitting: Internal Medicine

## 2015-03-26 ENCOUNTER — Other Ambulatory Visit (HOSPITAL_BASED_OUTPATIENT_CLINIC_OR_DEPARTMENT_OTHER): Payer: Medicare Other

## 2015-03-26 ENCOUNTER — Encounter: Payer: Self-pay | Admitting: Oncology

## 2015-03-26 VITALS — BP 127/69 | HR 58 | Temp 96.8°F | Resp 17 | Ht 72.0 in | Wt 245.6 lb

## 2015-03-26 DIAGNOSIS — E039 Hypothyroidism, unspecified: Secondary | ICD-10-CM

## 2015-03-26 DIAGNOSIS — C3431 Malignant neoplasm of lower lobe, right bronchus or lung: Secondary | ICD-10-CM

## 2015-03-26 DIAGNOSIS — Z006 Encounter for examination for normal comparison and control in clinical research program: Secondary | ICD-10-CM

## 2015-03-26 DIAGNOSIS — Z5111 Encounter for antineoplastic chemotherapy: Secondary | ICD-10-CM

## 2015-03-26 DIAGNOSIS — Z79899 Other long term (current) drug therapy: Secondary | ICD-10-CM

## 2015-03-26 LAB — CBC WITH DIFFERENTIAL/PLATELET
BASO%: 0.5 % (ref 0.0–2.0)
BASOS ABS: 0 10*3/uL (ref 0.0–0.1)
EOS ABS: 0.3 10*3/uL (ref 0.0–0.5)
EOS%: 4.2 % (ref 0.0–7.0)
HEMATOCRIT: 40.8 % (ref 38.4–49.9)
HEMOGLOBIN: 13.9 g/dL (ref 13.0–17.1)
LYMPH#: 2.2 10*3/uL (ref 0.9–3.3)
LYMPH%: 37.2 % (ref 14.0–49.0)
MCH: 33.9 pg — ABNORMAL HIGH (ref 27.2–33.4)
MCHC: 34.1 g/dL (ref 32.0–36.0)
MCV: 99.5 fL — AB (ref 79.3–98.0)
MONO#: 0.8 10*3/uL (ref 0.1–0.9)
MONO%: 13.1 % (ref 0.0–14.0)
NEUT%: 45 % (ref 39.0–75.0)
NEUTROS ABS: 2.7 10*3/uL (ref 1.5–6.5)
NRBC: 0 % (ref 0–0)
PLATELETS: 211 10*3/uL (ref 140–400)
RBC: 4.1 10*6/uL — ABNORMAL LOW (ref 4.20–5.82)
RDW: 12.5 % (ref 11.0–14.6)
WBC: 6 10*3/uL (ref 4.0–10.3)

## 2015-03-26 LAB — PHOSPHORUS: PHOSPHORUS: 3.2 mg/dL (ref 2.5–4.6)

## 2015-03-26 LAB — COMPREHENSIVE METABOLIC PANEL
ALT: 30 U/L (ref 0–55)
ANION GAP: 8 meq/L (ref 3–11)
AST: 29 U/L (ref 5–34)
Albumin: 4.1 g/dL (ref 3.5–5.0)
Alkaline Phosphatase: 63 U/L (ref 40–150)
BUN: 16.2 mg/dL (ref 7.0–26.0)
CHLORIDE: 104 meq/L (ref 98–109)
CO2: 28 meq/L (ref 22–29)
Calcium: 9.3 mg/dL (ref 8.4–10.4)
Creatinine: 1.1 mg/dL (ref 0.7–1.3)
EGFR: 69 mL/min/{1.73_m2} — AB (ref >90)
Glucose: 96 mg/dl (ref 70–140)
POTASSIUM: 3.9 meq/L (ref 3.5–5.1)
Sodium: 140 mEq/L (ref 136–145)
Total Bilirubin: 0.5 mg/dL (ref 0.20–1.20)
Total Protein: 7 g/dL (ref 6.4–8.3)

## 2015-03-26 LAB — LACTATE DEHYDROGENASE: LDH: 180 U/L (ref 125–245)

## 2015-03-26 LAB — MAGNESIUM: MAGNESIUM: 2.4 mg/dL (ref 1.5–2.5)

## 2015-03-26 LAB — TSH: TSH: 5.304 m[IU]/L — AB (ref 0.320–4.118)

## 2015-03-26 MED ORDER — ERLOTINIB HCL 100 MG PO TABS: 100.0000 mg | ORAL_TABLET | Freq: Every day | ORAL | Status: DC

## 2015-03-26 NOTE — Progress Notes (Signed)
03/26/15 - BMS [id number] Questionnaires (PRO's) - Patient into cancer center for routine visit.  Patient given PRO's upon arrival to the cancer center.  Patient completed PRO's ([id number] first and then the FACT-L) before any study procedures were done.  The PRO's were checked for completeness.  The patient was thanked for his continued support in this clinical trial. Remer Macho 03/26/15 - 8:40 am

## 2015-03-26 NOTE — Progress Notes (Signed)
03/26/2015 0945  BMS 370 cycle 22, week 42 The patient returns to clinic accompanied by his wife.  He completed the PROs and had labs drawn.  He had resting vital signs and O2 sat. taken. He reports having had an uneventful last 2 weeks.  He denies any changes in his breathing, including cough and wheezing.  He denies diarrhea, constipation, nausea, or vomiting.  His skin rash remains unchanged from last visit.  He reports that his first right toe nail infection continues to improve.   He denies swelling or eye/vision problems.  He continues with fatigue from time to time.  No new problems to report and no new medications. The patient had a targeted PE by S. [place], PA.  Dr. [name] was in to see patient.  Based on targeted exam and lab results review, Dr. [name] cleared the patient to continue protocol treatment. The TSH is a grade 1 (elevated).  Per protocol, continue treatment and repeat TSH at next visit.  MD aware and agrees. Marcellus Scott, RN  03/27/2015  1058 The patient was notified regarding increased TSH.  He verbalized understanding to contact his PCP, Dr. Inda Merlin, and make him aware.  He had no questions and thanked this RN for the call. Marcellus Scott, RN

## 2015-03-26 NOTE — Progress Notes (Signed)
[facility] [place] [facility] [phone]   Fax:([phone]  OFFICE PROGRESS NOTE  [name],[name], MD 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 Roxan Hockey on 01/23/2006.  2) Tarceva 150 mg by mouth daily as part of the BMS checkmate 370 clinical trial. Status post 6 weeks of treatment.   CURRENT THERAPY: Tarceva 100 mg by mouth daily as part of the BMS checkmate 370 clinical trial. Started 07/17/2014 Status post 8 months of treatment.  DISEASE STAGE: Recurrent  CHEMOTHERAPY INTENT: Palliative.  CURRENT # OF CHEMOTHERAPY CYCLES: 9  CURRENT ANTIEMETICS: None  CURRENT SMOKING STATUS: Non-smoker  ORAL CHEMOTHERAPY AND CONSENT: None  CURRENT BISPHOSPHONATES USE: None  LIVING WILL AND CODE STATUS: Full code.  INTERVAL HISTORY: [name] 69 y.o. male returns to the clinic today for followup visit accompanied by his wife. The patient is feeling fine today with no specific complaints. He denied having any skin rash or diarrhea. He denied having any significant chest pain, shortness of breath, cough or hemoptysis. The patient denied having any fever or chills, no nausea or vomiting. He denied having any significant weight loss or night sweats. He has repeat CBC and complains metabolic panel performed earlier today and he is here for evaluation and discussion of his lab results.  MEDICAL HISTORY: Past Medical History  Diagnosis Date  . Hypertension   . Hypercholesterolemia   . Sinus disease     hx of  . History of pneumonia  last 3-4 yrs ago    3 -4 different times  . Hypothyroidism   . Sleep apnea     uses cpap setting opf 12   . History of agent Orange exposure 44-45 yrs ago  . Complication of anesthesia 2007    quit breathing with bronchoscopy, had to spend night  . lung ca dx'd 2007    lung right    ALLERGIES:  is allergic to lisinopril.  MEDICATIONS:  Current Outpatient Prescriptions  Medication Sig Dispense Refill  . amitriptyline (ELAVIL) 50 MG tablet Take 50 mg by mouth at bedtime.    [name] aspirin 81 MG tablet Take 81 mg by mouth every morning.     . cholecalciferol (VITAMIN D) 1000 UNITS tablet Take 2,000 Units by mouth daily.     . clindamycin (CLEOCIN-T) 1 % external solution Apply topically 2 (two) times daily. 240 mL 2  . clindamycin (CLINDAGEL) 1 % gel   1  . diltiazem (CARDIZEM CD) 360 MG 24 hr capsule Take 360 mg by mouth every morning.     . fluticasone (CUTIVATE) 0.05 % cream     . levETIRAcetam (KEPPRA) 750 MG tablet     . levothyroxine (SYNTHROID, LEVOTHROID) 75 MCG tablet     . metoCLOPramide (REGLAN) 10 MG tablet Take 10 mg by mouth every 6 (six) hours as needed for nausea.     . metoprolol succinate (TOPROL-XL) 25 MG 24 hr tablet Take 25 mg by mouth every morning.     . Multiple Vitamins-Minerals (CENTRUM SILVER PO) Take 1 tablet by mouth daily.     . naproxen sodium (ANAPROX) 550 MG tablet Take 550 mg by mouth 3 (three) times daily as needed  for moderate pain.     . nitroGLYCERIN (NITROSTAT) 0.4 MG SL tablet Place 0.4 mg under the tongue every 5 (five) minutes as needed for chest pain.     . polyethylene glycol (MIRALAX / GLYCOLAX) packet Take 17 g by mouth daily.    . psyllium (METAMUCIL) 58.6 % powder Take 1 packet by mouth 3 (three) times daily.    . rosuvastatin (CRESTOR) 5 MG tablet Take 5 mg by mouth every morning.     . valsartan-hydrochlorothiazide (DIOVAN-HCT) 320-25 MG per tablet Take 1 tablet by mouth every morning.      No current facility-administered medications for this visit.    SURGICAL HISTORY:  Past Surgical History  Procedure Laterality Date  .  Cystourethroscopy, gyrus turp.  04/16/2010  . Torn medial and lateral menisci, left knee.  11/06/2006  . Right video-assisted thoracoscopy, right upper lobectomy and  01/23/2006  . The olympus video bronchoscope was introduced via the right  12/21/2005  . Nasal sinus surgery  1987  . Tonsillectomy  1957  . Colonoscopy with propofol N/A 03/19/2013    Procedure: COLONOSCOPY WITH PROPOFOL;  Surgeon: Garlan Fair, MD;  Location: [facility];  Service: Endoscopy;  Laterality: N/A;    REVIEW OF SYSTEMS:  A comprehensive review of systems was negative.   PHYSICAL EXAMINATION: General appearance: alert, cooperative and no distress Head: Normocephalic, without obvious abnormality, atraumatic Neck: no adenopathy Lymph nodes: Cervical, supraclavicular, and axillary nodes normal. Resp: clear to auscultation on the left, absent on the right due to lobectomy Back: symmetric, no curvature. ROM normal. No CVA tenderness. Cardio: regular rate and rhythm, S1, S2 normal, no murmur, click, rub or gallop GI: soft, non-tender; bowel sounds normal; no masses,  no organomegaly Extremities: extremities normal, atraumatic, no cyanosis or edema Neurologic: Alert and oriented X 3, normal strength and tone. Normal symmetric reflexes. Normal coordination and gait    ECOG PERFORMANCE STATUS: 1 - Symptomatic but completely ambulatory  Blood pressure 127/69, pulse 58, temperature 96.8 F (36 C), temperature source Oral, resp. rate 17, height 6' (1.829 m), weight 245 lb 9.6 oz (111.403 kg), SpO2 99 %.  LABORATORY DATA: CBC Latest Ref Rng 03/26/2015 03/11/2015 02/25/2015  WBC 4.0 - 10.3 10e3/uL 6.0 6.0 6.1  Hemoglobin 13.0 - 17.1 g/dL 13.9 13.5 13.2  Hematocrit 38.4 - 49.9 % 40.8 40.4 39.2  Platelets 140 - 400 10e3/uL 211 228 215   CMP Latest Ref Rng 03/11/2015 02/25/2015 02/12/2015  Glucose 70 - 140 mg/dl 88 98 103  BUN 7.0 - 26.0 mg/dL 17.4 19.3 17.0  Creatinine 0.7 - 1.3 mg/dL 1.1 1.0 1.0  Sodium 136 -  145 mEq/L 140 140 139  Potassium 3.5 - 5.1 mEq/L 3.6 3.8 3.6  Chloride 96 - 112 mmol/L - - -  CO2 22 - 29 mEq/L '27 28 26  ' Calcium 8.4 - 10.4 mg/dL 9.6 9.5 9.6  Total Protein 6.4 - 8.3 g/dL 6.7 6.3(L) 6.5  Total Bilirubin 0.20 - 1.20 mg/dL 0.53 0.50 0.67  Alkaline Phos 40 - 150 U/L 58 60 61  AST 5 - 34 U/L 28 38(H) 30  ALT 0 - 55 U/L 27 36 32     RADIOGRAPHIC STUDIES: .ctCLINICAL DATA: Non small cell lung cancer. Recist protocol 1.1  EXAM: CT CHEST, ABDOMEN, AND PELVIS WITH CONTRAST  TECHNIQUE: Multidetector CT imaging of the chest, abdomen and pelvis was performed following the standard protocol during bolus administration of intravenous contrast.  CONTRAST: 174m OMNIPAQUE IOHEXOL 300 MG/ML SOLN  COMPARISON:  Numerous prior CT examinations. The most recent is 01/22/2015  FINDINGS: Recist 1.1:  Target lesions:  1. Right lower lobe pulmonary lesion on series 5, image 19 measures 17 mm. Previous 18 mm. 2. Linear density in the right lower lobe on series 5, image 21 measures 16 mm. Previous 16 mm.  Non target lesions:  1. Sub solid nodule in the right lower lobe on series 5, image 22 measures 6 mm. Previous 6 mm. CT CHEST FINDINGS  Mediastinum/Lymph Nodes: No chest wall mass, supraclavicular or axillary adenopathy. The thyroid gland appears normal.  The heart is normal in size. No pericardial effusion. The aorta is normal in caliber. No dissection. The branch vessels are patent. Small scattered mediastinal and hilar lymph nodes are stable. No mass or overt adenopathy.  Lungs/Pleura: The lungs are clear of acute process. Stable surgical changes from a right upper lobe lobectomy. Patchy nodular airspace opacities are unchanged as discussed above. No new pulmonary lesions. No pleural effusion. Stable eventration of the right hemidiaphragm.  Musculoskeletal: No significant bony findings. No findings suspicious for metastatic bone disease.  CT  ABDOMEN PELVIS FINDINGS  Hepatobiliary: No focal hepatic lesions or intrahepatic biliary dilatation. Colonic interposition is again demonstrated. The gallbladder demonstrates a stable 25 mm gallstone. No findings for acute inflammation. No common bile duct dilatation.  Pancreas: No mass, inflammation or ductal dilatation.  Spleen: Normal size. No focal lesions.  Adrenals/Urinary Tract: The adrenal glands are normal. Both kidneys are normal and stable.  Stomach/Bowel: The stomach, duodenum, small bowel and colon are unremarkable. No inflammatory changes, mass lesions or obstructive findings.  Vascular/Lymphatic: No mesenteric or retroperitoneal mass or adenopathy. Small scattered lymph nodes are stable. The aorta and branch vessels are patent. Minimal scattered atherosclerotic calcifications. The major venous structures are patent.  Reproductive: The bladder, prostate gland and seminal vesicles are unremarkable. No pelvic mass or adenopathy. No free pelvic fluid collections. No inguinal mass or adenopathy. Prominent inguinal rings containing fat.  Other: No ascites, abdominal wall hernia or subcutaneous lesions.  Musculoskeletal: No significant bony findings.  IMPRESSION: 1. Stable nodular densities in the right lung. No progressive findings or new nodules. 2. No mediastinal or hilar mass or adenopathy. 3. No findings for metastatic disease in the abdomen/pelvis. 4. Cholelithiasis.   Electronically Signed  By: Marijo Sanes M.D.  On: 03/19/2015 10:13  ASSESSMENT AND PLAN: This is a very pleasant 69 years old white male with recurrent non-small cell lung cancer, adenocarcinoma with positive EGFR mutation in exon 21 that was initially diagnosed as stage IA non-small cell lung cancer status post right upper lobectomy and has been observation since October of 2007. He is status post 6 weeks treatment with Tarceva 150 mg by mouth daily according to the BMS  checkmate 370 clinical trial. He is currently on Tarceva 100 mg by mouth daily for the last 9 months and tolerating it fairly well.  CT scan of the chest for restaging of his disease on 03/19/15 shows stable disease without new nodules, or mediastinal or hilar adenopathy. There are no new findings for metastatic disease in the abdomen and pelvis.. Recommended for the patient to continue his current treatment with Tarceva at the same dose.  He would come back for follow-up visit in 2 weeks for reevaluation according to the study protocol The patient was advised to call immediately if he has any other concerning symptoms in the interval.  All questions were answered. The patient knows to call the clinic with any problems, questions or  concerns. We can certainly see the patient much sooner if necessary.  ADDENDUM: Hematology/Oncology Attending: I had a face to face encounter with the patient. I recommended his care plan. This is a very pleasant 69 years old white male with recurrent non-small cell lung cancer, adenocarcinoma with positive EGFR mutation in exon 21. He is currently undergoing treatment with Tarceva 100 mg by mouth daily. He is tolerating his treatment fairly well with no significant adverse effects. He has no skin rash or diarrhea. The patient denied having any significant nausea or vomiting. He has no significant fever or chills. Has no significant chest pain, shortness breath, cough or hemoptysis. The recent CT scan of the chest, abdomen and pelvis showed no evidence for disease progression. I discussed the scan results with the patient and his wife. I recommended for him to continue on treatment with Tarceva with the same dose. The patient has a history of hypothyroidism and he is currently on treatment with levothyroxine 75 mcg by mouth daily. His TSH is slightly elevated today. We will recommend for the patient to reconsult with his primary care physician regarding increasing the dose  of levothyroxine and close monitoring. The patient would come back for follow-up visit in 2 weeks for reevaluation and repeat blood work. He was advised to call immediately if he has any concerning symptoms in the interval.  Disclaimer: This note was dictated with voice recognition software. Similar sounding words can inadvertently be transcribed and may be missed upon review. Eilleen Kempf., MD 03/27/2015

## 2015-03-27 ENCOUNTER — Telehealth: Payer: Self-pay | Admitting: *Deleted

## 2015-03-27 NOTE — Telephone Encounter (Signed)
03/27/2015  0850 Left voice message for patient to return phone call.[name]  Contact name and number provided.

## 2015-03-27 NOTE — Telephone Encounter (Signed)
03/27/2015 1058 Spoke with the patient by phone and informed him of TSH being elevated from 03/26/15 lab work (TSH value=5.304 H).  Dr. [name] aware and would like patient to contact his PCP and make him aware.  The patient stated he would contact his PCP, Dr. Inda Merlin, today.  The patient thanked this RN for the call and had no further questions.

## 2015-04-08 ENCOUNTER — Encounter: Payer: Self-pay | Admitting: Internal Medicine

## 2015-04-08 ENCOUNTER — Encounter: Payer: Self-pay | Admitting: *Deleted

## 2015-04-08 DIAGNOSIS — Z79899 Other long term (current) drug therapy: Secondary | ICD-10-CM

## 2015-04-08 DIAGNOSIS — C3431 Malignant neoplasm of lower lobe, right bronchus or lung: Secondary | ICD-10-CM

## 2015-04-08 MED ORDER — ERLOTINIB HCL 100 MG PO TABS: 100.0000 mg | ORAL_TABLET | Freq: Every day | ORAL | Status: DC

## 2015-04-09 ENCOUNTER — Ambulatory Visit (HOSPITAL_BASED_OUTPATIENT_CLINIC_OR_DEPARTMENT_OTHER): Payer: Medicare Other | Admitting: Internal Medicine

## 2015-04-09 ENCOUNTER — Encounter: Payer: Self-pay | Admitting: Internal Medicine

## 2015-04-09 ENCOUNTER — Other Ambulatory Visit (HOSPITAL_COMMUNITY)
Admission: RE | Admit: 2015-04-09 | Discharge: 2015-04-09 | Disposition: A | Discharge status: Home / Self Care | Payer: Medicare Other | Source: Ambulatory Visit | Attending: Internal Medicine | Admitting: Internal Medicine

## 2015-04-09 ENCOUNTER — Other Ambulatory Visit (HOSPITAL_BASED_OUTPATIENT_CLINIC_OR_DEPARTMENT_OTHER): Payer: Medicare Other

## 2015-04-09 VITALS — BP 123/72 | HR 64 | Temp 97.9°F | Resp 18 | Ht 72.0 in | Wt 243.4 lb

## 2015-04-09 DIAGNOSIS — Z79891 Long term (current) use of opiate analgesic: Secondary | ICD-10-CM | POA: Diagnosis not present

## 2015-04-09 DIAGNOSIS — C3491 Malignant neoplasm of unspecified part of right bronchus or lung: Secondary | ICD-10-CM | POA: Diagnosis not present

## 2015-04-09 DIAGNOSIS — Z006 Encounter for examination for normal comparison and control in clinical research program: Secondary | ICD-10-CM | POA: Diagnosis not present

## 2015-04-09 DIAGNOSIS — C3431 Malignant neoplasm of lower lobe, right bronchus or lung: Secondary | ICD-10-CM | POA: Insufficient documentation

## 2015-04-09 DIAGNOSIS — Z79899 Other long term (current) drug therapy: Secondary | ICD-10-CM

## 2015-04-09 DIAGNOSIS — Z5111 Encounter for antineoplastic chemotherapy: Secondary | ICD-10-CM

## 2015-04-09 LAB — CBC WITH DIFFERENTIAL/PLATELET
BASO%: 0.5 % (ref 0.0–2.0)
Basophils Absolute: 0 10*3/uL (ref 0.0–0.1)
EOS ABS: 0.2 10*3/uL (ref 0.0–0.5)
EOS%: 3.9 % (ref 0.0–7.0)
HCT: 40.1 % (ref 38.4–49.9)
HEMOGLOBIN: 14 g/dL (ref 13.0–17.1)
LYMPH%: 38.9 % (ref 14.0–49.0)
MCH: 34.3 pg — ABNORMAL HIGH (ref 27.2–33.4)
MCHC: 34.9 g/dL (ref 32.0–36.0)
MCV: 98.3 fL — AB (ref 79.3–98.0)
MONO#: 0.6 10*3/uL (ref 0.1–0.9)
MONO%: 9.3 % (ref 0.0–14.0)
NEUT%: 47.4 % (ref 39.0–75.0)
NEUTROS ABS: 2.8 10*3/uL (ref 1.5–6.5)
Platelets: 216 10*3/uL (ref 140–400)
RBC: 4.08 10*6/uL — ABNORMAL LOW (ref 4.20–5.82)
RDW: 12.5 % (ref 11.0–14.6)
WBC: 5.9 10*3/uL (ref 4.0–10.3)
lymph#: 2.3 10*3/uL (ref 0.9–3.3)

## 2015-04-09 LAB — COMPREHENSIVE METABOLIC PANEL
ALBUMIN: 4.1 g/dL (ref 3.5–5.0)
ALK PHOS: 64 U/L (ref 40–150)
ALT: 30 U/L (ref 0–55)
AST: 27 U/L (ref 5–34)
Anion Gap: 7 mEq/L (ref 3–11)
BILIRUBIN TOTAL: 0.52 mg/dL (ref 0.20–1.20)
BUN: 16.7 mg/dL (ref 7.0–26.0)
CO2: 26 mEq/L (ref 22–29)
CREATININE: 1.1 mg/dL (ref 0.7–1.3)
Calcium: 9.5 mg/dL (ref 8.4–10.4)
Chloride: 105 mEq/L (ref 98–109)
EGFR: 72 mL/min/{1.73_m2} — AB (ref >90)
GLUCOSE: 100 mg/dL (ref 70–140)
Potassium: 3.8 mEq/L (ref 3.5–5.1)
SODIUM: 139 meq/L (ref 136–145)
TOTAL PROTEIN: 6.9 g/dL (ref 6.4–8.3)

## 2015-04-09 LAB — LACTATE DEHYDROGENASE: LDH: 177 U/L (ref 125–245)

## 2015-04-09 LAB — T3: T3 TOTAL: 85.5 ng/dL (ref 80.0–204.0)

## 2015-04-09 LAB — PHOSPHORUS: Phosphorus: 3.1 mg/dL (ref 2.5–4.6)

## 2015-04-09 LAB — T4: T4, Total: 7.7 ug/dL (ref 4.5–12.0)

## 2015-04-09 LAB — TSH: TSH: 4.461 m(IU)/L — ABNORMAL HIGH (ref 0.320–4.118)

## 2015-04-09 LAB — MAGNESIUM: Magnesium: 2.2 mg/dl (ref 1.5–2.5)

## 2015-04-09 NOTE — Progress Notes (Signed)
04/09/2015  0900  BMS 370, Group D Erlotinib only cycle 23, week 44 The patient comes in today with his wife.  He reports an uneventful last 2 weeks.  He had resting vitals signs with O2 sat. obtained.  Labs were drawn.  The patient denies any symptoms including diarrhea, constipation, or other GI symptoms.  He reports that his skin rash is much better, although present.  The first toe fungal infection is present, but minimal.  The patient denies any problems with his eyes or vision.  He denies any changes with his breathing and only gets SOB with exertion (baseline).  His fatigue level is unchanged.  Dr. [name] was in to see the patient and perform a targeted PE.  Based on lab results review and PE, the patient was cleared to continue treatment.   Marcellus Scott, Research Nurse  04/09/2015 1118 Dr. [name] made aware of increased TSH level.  He recommended the patient contact Dr. Inda Merlin regarding the TSH level since he currently takes Synthroid.  Dr. [name] does not think the elevated TSH is related to Erlotinib and stated patient should continue Erlotinib.  A T3 and T4 will be done today, per protocol,  and results will come back tomorrow. The patient had left the office when the TSH came back.  He was called at 1115 and informed of above.  He stated he would contact Dr. Inda Merlin for appointment.  He had no questions regarding the plan of care. Marcellus Scott, RN

## 2015-04-09 NOTE — Progress Notes (Signed)
04/09/2015 1502 late entry Per patient, he contacted Dr. Inda Merlin after the last visit, on 03/26/15, informing him of the elevated TSH, as recommended by dD [name].  The patient stated Dr. Inda Merlin wanted to wait until today's TSH result is back.  Dr. Inda Merlin did not advise the patient to change anything on 03/26/15.  Per today's earlier conversation with the patient, he will contact Dr. Inda Merlin again and update him with TSH result from today.

## 2015-04-09 NOTE — Progress Notes (Signed)
[facility] Telephone:([phone]   Fax:(336) [phone]  OFFICE PROGRESS NOTE  [name],[name], MD 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 Roxan Hockey on 01/23/2006.  2) Tarceva 150 mg by mouth daily as part of the BMS checkmate 370 clinical trial. Status post 6 weeks of treatment.   CURRENT THERAPY: Tarceva 100 mg by mouth daily as part of the BMS checkmate 370 clinical trial. Started 07/17/2014 Status post 9 months of treatment.  DISEASE STAGE: Recurrent  CHEMOTHERAPY INTENT: Palliative.  CURRENT # OF CHEMOTHERAPY CYCLES: 10  CURRENT ANTIEMETICS: None  CURRENT SMOKING STATUS: Non-smoker  ORAL CHEMOTHERAPY AND CONSENT: None  CURRENT BISPHOSPHONATES USE: None  LIVING WILL AND CODE STATUS: Full code.  INTERVAL HISTORY: [name] 70 y.o. male returns to the clinic today for followup visit accompanied by his wife. The patient is feeling fine today with no specific complaints. No significant change since his last visit. He denied having any skin rash or diarrhea. He denied having any significant chest pain, shortness of breath, cough or hemoptysis. The patient denied having any fever or chills, no nausea or vomiting. He denied having any significant weight loss or night sweats. His last CT scan of the chest, abdomen and pelvis showed no evidence for disease progression. He has repeat CBC and comprehensive metabolic panel performed earlier today and he is here for evaluation and discussion of his lab results.  MEDICAL HISTORY: Past Medical History  Diagnosis Date  . Hypertension   . Hypercholesterolemia   . Sinus disease       hx of  . History of pneumonia  last 3-4 yrs ago    3 -4 different times  . Hypothyroidism   . Sleep apnea     uses cpap setting opf 12  . History of agent Orange exposure 44-45 yrs ago  . Complication of anesthesia 2007    quit breathing with bronchoscopy, had to spend night  . lung ca dx'd 2007    lung right    ALLERGIES:  is allergic to lisinopril.  MEDICATIONS:  Current Outpatient Prescriptions  Medication Sig Dispense Refill  . amitriptyline (ELAVIL) 50 MG tablet Take 50 mg by mouth at bedtime.    [name] aspirin 81 MG tablet Take 81 mg by mouth every morning.     . cholecalciferol (VITAMIN D) 1000 UNITS tablet Take 2,000 Units by mouth daily.     . clindamycin (CLEOCIN-T) 1 % external solution Apply topically 2 (two) times daily. 240 mL 2  . clindamycin (CLINDAGEL) 1 % gel   1  . diltiazem (CARDIZEM CD) 360 MG 24 hr capsule Take 360 mg by mouth every morning.     . erlotinib (TARCEVA) 100 MG tablet Take 1 tablet (100 mg total) by mouth daily. Take on an empty stomach 1 hour before meals or 2 hours after.    . fluticasone (CUTIVATE) 0.05 % cream     . levETIRAcetam (KEPPRA) 750 MG tablet     . levothyroxine (SYNTHROID, LEVOTHROID) 75 MCG tablet     . metoCLOPramide (REGLAN) 10 MG tablet Take 10 mg by mouth every 6 (six) hours as needed for nausea.     [name]  metoprolol succinate (TOPROL-XL) 25 MG 24 hr tablet Take 25 mg by mouth every morning.     . Multiple Vitamins-Minerals (CENTRUM SILVER PO) Take 1 tablet by mouth daily.     . naproxen sodium (ANAPROX) 550 MG tablet Take 550 mg by mouth 3 (three) times daily as needed for moderate pain.     . nitroGLYCERIN (NITROSTAT) 0.4 MG SL tablet Place 0.4 mg under the tongue every 5 (five) minutes as needed for chest pain.     . polyethylene glycol (MIRALAX / GLYCOLAX) packet Take 17 g by mouth daily.    . psyllium (METAMUCIL) 58.6 % powder Take 1 packet by mouth 3 (three) times daily.    . rosuvastatin (CRESTOR) 5 MG tablet Take 5 mg by  mouth every morning.     . valsartan-hydrochlorothiazide (DIOVAN-HCT) 320-25 MG per tablet Take 1 tablet by mouth every morning.      No current facility-administered medications for this visit.    SURGICAL HISTORY:  Past Surgical History  Procedure Laterality Date  . Cystourethroscopy, gyrus turp.  04/16/2010  . Torn medial and lateral menisci, left knee.  11/06/2006  . Right video-assisted thoracoscopy, right upper lobectomy and  01/23/2006  . The olympus video bronchoscope was introduced via the right  12/21/2005  . Nasal sinus surgery  1987  . Tonsillectomy  1957  . Colonoscopy with propofol N/A 03/19/2013    Procedure: COLONOSCOPY WITH PROPOFOL;  Surgeon: Garlan Fair, MD;  Location: [facility];  Service: Endoscopy;  Laterality: N/A;    REVIEW OF SYSTEMS:  A comprehensive review of systems was negative.   PHYSICAL EXAMINATION: General appearance: alert, cooperative and no distress Head: Normocephalic, without obvious abnormality, atraumatic Neck: no adenopathy Lymph nodes: Cervical, supraclavicular, and axillary nodes normal. Resp: clear to auscultation bilaterally Back: symmetric, no curvature. ROM normal. No CVA tenderness. Cardio: regular rate and rhythm, S1, S2 normal, no murmur, click, rub or gallop GI: soft, non-tender; bowel sounds normal; no masses,  no organomegaly Extremities: extremities normal, atraumatic, no cyanosis or edema Neurologic: Alert and oriented X 3, normal strength and tone. Normal symmetric reflexes. Normal coordination and gait    ECOG PERFORMANCE STATUS: 1 - Symptomatic but completely ambulatory  Blood pressure 123/72, pulse 64, temperature 97.9 F (36.6 C), temperature source Oral, resp. rate 18, height 6' (1.829 m), weight 243 lb 6.4 oz (110.406 kg), SpO2 97 %.  LABORATORY DATA: Lab Results  Component Value Date   WBC 5.9 04/09/2015   HGB 14.0 04/09/2015   HCT 40.1 04/09/2015   MCV 98.3* 04/09/2015   PLT 216 04/09/2015       Chemistry      Component Value Date/Time   NA 140 03/26/2015 0850   NA 138 07/17/2014 1000   NA 139 10/20/2011 0903   K 3.9 03/26/2015 0850   K 3.3* 07/17/2014 1000   K 3.4 10/20/2011 0903   CL 107 07/17/2014 1000   CL 102 04/24/2012 0853   CL 101 10/20/2011 0903   CO2 28 03/26/2015 0850   CO2 26 07/17/2014 1000   CO2 29 10/20/2011 0903   BUN 16.2 03/26/2015 0850   BUN 16 07/17/2014 1000   BUN 12 10/20/2011 0903   CREATININE 1.1 03/26/2015 0850   CREATININE 0.82 07/17/2014 1000   CREATININE 0.9 10/20/2011 0903      Component Value Date/Time   CALCIUM 9.3 03/26/2015 0850   CALCIUM 8.9 07/17/2014 1000   CALCIUM 9.0 10/20/2011 0903   ALKPHOS 63 03/26/2015 0850  ALKPHOS 53 07/17/2014 1000   ALKPHOS 52 10/20/2011 0903   AST 29 03/26/2015 0850   AST 24 07/17/2014 1000   AST 24 10/20/2011 0903   ALT 30 03/26/2015 0850   ALT 28 07/17/2014 1000   ALT 31 10/20/2011 0903   BILITOT 0.50 03/26/2015 0850   BILITOT 0.8 07/17/2014 1000   BILITOT 0.60 10/20/2011 2563       RADIOGRAPHIC STUDIES:  ASSESSMENT AND PLAN: This is a very pleasant 70 years old white male with recurrent non-small cell lung cancer, adenocarcinoma with positive EGFR mutation in exon 21 that was initially diagnosed as stage IA non-small cell lung cancer status post right upper lobectomy and has been observation since October of 2007. He is status post 6 weeks treatment with Tarceva 150 mg by mouth daily according to the BMS checkmate 370 clinical trial. He is currently on Tarceva 100 mg by mouth daily for the last 9 months and tolerating it fairly well.  I recommended for the patient to continue his current treatment with Tarceva at the same dose. He would come back for follow-up visit in 2 weeks for reevaluation according to the study protocol after repeating blood work.  The patient was advised to call immediately if he has any other concerning symptoms in the interval.  All questions were answered. The  patient knows to call the clinic with any problems, questions or concerns. We can certainly see the patient much sooner if necessary.  Disclaimer: This note was dictated with voice recognition software. Similar sounding words can inadvertently be transcribed and may not be corrected upon review.

## 2015-04-22 ENCOUNTER — Encounter: Payer: Self-pay | Admitting: Internal Medicine

## 2015-04-22 ENCOUNTER — Encounter: Payer: Self-pay | Admitting: *Deleted

## 2015-04-22 DIAGNOSIS — C3431 Malignant neoplasm of lower lobe, right bronchus or lung: Secondary | ICD-10-CM

## 2015-04-22 DIAGNOSIS — E039 Hypothyroidism, unspecified: Secondary | ICD-10-CM | POA: Diagnosis not present

## 2015-04-22 DIAGNOSIS — Z79899 Other long term (current) drug therapy: Secondary | ICD-10-CM

## 2015-04-22 MED ORDER — ERLOTINIB HCL 100 MG PO TABS: 100.0000 mg | ORAL_TABLET | Freq: Every day | ORAL | Status: DC

## 2015-04-23 ENCOUNTER — Other Ambulatory Visit (HOSPITAL_BASED_OUTPATIENT_CLINIC_OR_DEPARTMENT_OTHER): Payer: Medicare Other

## 2015-04-23 ENCOUNTER — Other Ambulatory Visit: Payer: Self-pay | Admitting: Internal Medicine

## 2015-04-23 ENCOUNTER — Telehealth: Payer: Self-pay | Admitting: Internal Medicine

## 2015-04-23 ENCOUNTER — Other Ambulatory Visit (HOSPITAL_COMMUNITY)
Admission: RE | Admit: 2015-04-23 | Discharge: 2015-04-23 | Disposition: A | Discharge status: Home / Self Care | Payer: Medicare Other | Source: Ambulatory Visit | Attending: Internal Medicine | Admitting: Internal Medicine

## 2015-04-23 ENCOUNTER — Ambulatory Visit (HOSPITAL_BASED_OUTPATIENT_CLINIC_OR_DEPARTMENT_OTHER): Payer: Medicare Other | Admitting: Internal Medicine

## 2015-04-23 ENCOUNTER — Other Ambulatory Visit: Payer: Self-pay | Admitting: *Deleted

## 2015-04-23 ENCOUNTER — Encounter: Payer: Self-pay | Admitting: Internal Medicine

## 2015-04-23 VITALS — BP 124/86 | HR 73 | Temp 97.8°F | Resp 19 | Ht 72.0 in | Wt 244.1 lb

## 2015-04-23 DIAGNOSIS — C3491 Malignant neoplasm of unspecified part of right bronchus or lung: Secondary | ICD-10-CM

## 2015-04-23 DIAGNOSIS — C3431 Malignant neoplasm of lower lobe, right bronchus or lung: Secondary | ICD-10-CM

## 2015-04-23 DIAGNOSIS — Z79899 Other long term (current) drug therapy: Secondary | ICD-10-CM

## 2015-04-23 DIAGNOSIS — Z006 Encounter for examination for normal comparison and control in clinical research program: Secondary | ICD-10-CM

## 2015-04-23 DIAGNOSIS — Z5111 Encounter for antineoplastic chemotherapy: Secondary | ICD-10-CM

## 2015-04-23 LAB — CBC WITH DIFFERENTIAL/PLATELET
BASO%: 0.5 % (ref 0.0–2.0)
Basophils Absolute: 0 10*3/uL (ref 0.0–0.1)
EOS ABS: 0.2 10*3/uL (ref 0.0–0.5)
EOS%: 4 % (ref 0.0–7.0)
HCT: 41.7 % (ref 38.4–49.9)
HEMOGLOBIN: 14.4 g/dL (ref 13.0–17.1)
LYMPH%: 39 % (ref 14.0–49.0)
MCH: 33.8 pg — ABNORMAL HIGH (ref 27.2–33.4)
MCHC: 34.5 g/dL (ref 32.0–36.0)
MCV: 97.9 fL (ref 79.3–98.0)
MONO#: 0.6 10*3/uL (ref 0.1–0.9)
MONO%: 10.5 % (ref 0.0–14.0)
NEUT%: 46 % (ref 39.0–75.0)
NEUTROS ABS: 2.7 10*3/uL (ref 1.5–6.5)
Platelets: 222 10*3/uL (ref 140–400)
RBC: 4.26 10*6/uL (ref 4.20–5.82)
RDW: 12.5 % (ref 11.0–14.6)
WBC: 5.9 10*3/uL (ref 4.0–10.3)
lymph#: 2.3 10*3/uL (ref 0.9–3.3)

## 2015-04-23 LAB — COMPREHENSIVE METABOLIC PANEL
ALBUMIN: 4.2 g/dL (ref 3.5–5.0)
ALK PHOS: 60 U/L (ref 40–150)
ALT: 28 U/L (ref 0–55)
AST: 28 U/L (ref 5–34)
Anion Gap: 7 mEq/L (ref 3–11)
BILIRUBIN TOTAL: 0.47 mg/dL (ref 0.20–1.20)
BUN: 14.3 mg/dL (ref 7.0–26.0)
CO2: 28 mEq/L (ref 22–29)
CREATININE: 1.1 mg/dL (ref 0.7–1.3)
Calcium: 9.8 mg/dL (ref 8.4–10.4)
Chloride: 104 mEq/L (ref 98–109)
EGFR: 72 mL/min/{1.73_m2} — AB (ref >90)
GLUCOSE: 105 mg/dL (ref 70–140)
Potassium: 4 mEq/L (ref 3.5–5.1)
SODIUM: 140 meq/L (ref 136–145)
TOTAL PROTEIN: 7.1 g/dL (ref 6.4–8.3)

## 2015-04-23 LAB — PHOSPHORUS: Phosphorus: 3.1 mg/dL (ref 2.5–4.6)

## 2015-04-23 LAB — LACTATE DEHYDROGENASE: LDH: 188 U/L (ref 125–245)

## 2015-04-23 LAB — MAGNESIUM: Magnesium: 2.3 mg/dl (ref 1.5–2.5)

## 2015-04-23 MED FILL — TARCEVA 100 MG TABLET: 100 | 30 days supply | Qty: 30 | Fill #0

## 2015-04-23 NOTE — Progress Notes (Signed)
04/23/2015 1140  BMS 370 cycle 24, week 46 The patient returns to clinic accompanied by his wife.  He had labs done and resting VS, including O2 sat.  The patient has no complaints since the last visit.  He stated that he has not missed taking any of the erlotinib.  He also confirmed that he has not started any mew medications since last visit.  The patient went to see his primary care MD re: elevated TSH.  They decided to increase his dose of Synthroid, which was started today.  The patient stated that Dr. Inda Merlin was not concerned.  Dr. [name] again reiterated that the elevated TSH is not related to erlotinib. The patient stated his rash remains unchanged.  His other toxicities listed are unchanged. The patient denied any complaints or concerns today. Based on lab results review and targeted PE by Dr. [name], the patient was cleared to continue with treatment. This RN thanked the patient for his continued compliance and participation on the study.

## 2015-04-23 NOTE — Progress Notes (Signed)
[place] [phone]   [phone]  OFFICE PROGRESS NOTE  [name],[name], MD 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 Roxan Hockey on 01/23/2006.  2) Tarceva 150 mg by mouth daily as part of the BMS checkmate 370 clinical trial. Status post 6 weeks of treatment.   CURRENT THERAPY: Tarceva 100 mg by mouth daily as part of the BMS checkmate 370 clinical trial. Started 07/17/2014 Status post 9 months of treatment.  DISEASE STAGE: Recurrent  CHEMOTHERAPY INTENT: Palliative.  CURRENT # OF CHEMOTHERAPY CYCLES: 10  CURRENT ANTIEMETICS: None  CURRENT SMOKING STATUS: Non-smoker  ORAL CHEMOTHERAPY AND CONSENT: None  CURRENT BISPHOSPHONATES USE: None  LIVING WILL AND CODE STATUS: Full code.  INTERVAL HISTORY: [name] 70 y.o. male returns to the clinic today for followup visit accompanied by his wife. The patient is feeling fine today with no specific complaints. He denied having any skin rash or diarrhea. He denied having any significant chest pain, shortness of breath, cough or hemoptysis. The patient denied having any fever or chills, no nausea or vomiting. He denied having any significant weight loss or night sweats. He has repeat CBC and comprehensive metabolic panel performed earlier today and he is here for evaluation and discussion of his lab results.  MEDICAL HISTORY: Past Medical History  Diagnosis Date  . Hypertension   . Hypercholesterolemia   . Sinus disease     hx of  . History of pneumonia  last 3-4 yrs ago    3 -4 different times  . Hypothyroidism   . Sleep apnea     uses cpap setting  opf 12  . History of agent Orange exposure 44-45 yrs ago  . Complication of anesthesia 2007    quit breathing with bronchoscopy, had to spend night  . lung ca dx'd 2007    lung right    ALLERGIES:  is allergic to lisinopril.  MEDICATIONS:  Current Outpatient Prescriptions  Medication Sig Dispense Refill  . amitriptyline (ELAVIL) 50 MG tablet Take 50 mg by mouth at bedtime.    [name] aspirin 81 MG tablet Take 81 mg by mouth every morning.     . cholecalciferol (VITAMIN D) 1000 UNITS tablet Take 2,000 Units by mouth daily.     . clindamycin (CLEOCIN-T) 1 % external solution Apply topically 2 (two) times daily. 240 mL 2  . clindamycin (CLINDAGEL) 1 % gel   1  . diltiazem (CARDIZEM CD) 360 MG 24 hr capsule Take 360 mg by mouth every morning.     . erlotinib (TARCEVA) 100 MG tablet Take 1 tablet (100 mg total) by mouth daily. Take on an empty stomach 1 hour before meals or 2 hours after.    . erlotinib (TARCEVA) 100 MG tablet Take 1 tablet (100 mg total) by mouth daily. Take on an empty stomach 1 hour before meals or 2 hours after.    . fluticasone (CUTIVATE) 0.05 % cream     . levETIRAcetam (KEPPRA) 750 MG tablet     . levothyroxine (SYNTHROID, LEVOTHROID) 75 MCG tablet     . metoCLOPramide (REGLAN) 10 MG tablet Take 10 mg by mouth every 6 (six) hours  as needed for nausea.     . metoprolol succinate (TOPROL-XL) 25 MG 24 hr tablet Take 25 mg by mouth every morning.     . Multiple Vitamins-Minerals (CENTRUM SILVER PO) Take 1 tablet by mouth daily.     . naproxen sodium (ANAPROX) 550 MG tablet Take 550 mg by mouth 3 (three) times daily as needed for moderate pain.     . nitroGLYCERIN (NITROSTAT) 0.4 MG SL tablet Place 0.4 mg under the tongue every 5 (five) minutes as needed for chest pain.     . polyethylene glycol (MIRALAX / GLYCOLAX) packet Take 17 g by mouth daily.    . psyllium (METAMUCIL) 58.6 % powder Take 1 packet by mouth 3 (three) times daily.    . rosuvastatin (CRESTOR) 5 MG tablet Take  5 mg by mouth every morning.     . valsartan-hydrochlorothiazide (DIOVAN-HCT) 320-25 MG per tablet Take 1 tablet by mouth every morning.      No current facility-administered medications for this visit.    SURGICAL HISTORY:  Past Surgical History  Procedure Laterality Date  . Cystourethroscopy, gyrus turp.  04/16/2010  . Torn medial and lateral menisci, left knee.  11/06/2006  . Right video-assisted thoracoscopy, right upper lobectomy and  01/23/2006  . The olympus video bronchoscope was introduced via the right  12/21/2005  . Nasal sinus surgery  1987  . Tonsillectomy  1957  . Colonoscopy with propofol N/A 03/19/2013    Procedure: COLONOSCOPY WITH PROPOFOL;  Surgeon: Garlan Fair, MD;  Location: [facility];  Service: Endoscopy;  Laterality: N/A;    REVIEW OF SYSTEMS:  A comprehensive review of systems was negative.   PHYSICAL EXAMINATION: General appearance: alert, cooperative and no distress Head: Normocephalic, without obvious abnormality, atraumatic Neck: no adenopathy Lymph nodes: Cervical, supraclavicular, and axillary nodes normal. Resp: clear to auscultation bilaterally Back: symmetric, no curvature. ROM normal. No CVA tenderness. Cardio: regular rate and rhythm, S1, S2 normal, no murmur, click, rub or gallop GI: soft, non-tender; bowel sounds normal; no masses,  no organomegaly Extremities: extremities normal, atraumatic, no cyanosis or edema Neurologic: Alert and oriented X 3, normal strength and tone. Normal symmetric reflexes. Normal coordination and gait    ECOG PERFORMANCE STATUS: 0 - Asymptomatic  Blood pressure 124/86, pulse 73, temperature 97.8 F (36.6 C), temperature source Oral, resp. rate 19, height 6' (1.829 m), weight 244 lb 1.6 oz (110.723 kg), SpO2 100 %.  LABORATORY DATA: Lab Results  Component Value Date   WBC 5.9 04/23/2015   HGB 14.4 04/23/2015   HCT 41.7 04/23/2015   MCV 97.9 04/23/2015   PLT 222 04/23/2015      Chemistry        Component Value Date/Time   NA 140 04/23/2015 1113   NA 138 07/17/2014 1000   NA 139 10/20/2011 0903   K 4.0 04/23/2015 1113   K 3.3* 07/17/2014 1000   K 3.4 10/20/2011 0903   CL 107 07/17/2014 1000   CL 102 04/24/2012 0853   CL 101 10/20/2011 0903   CO2 28 04/23/2015 1113   CO2 26 07/17/2014 1000   CO2 29 10/20/2011 0903   BUN 14.3 04/23/2015 1113   BUN 16 07/17/2014 1000   BUN 12 10/20/2011 0903   CREATININE 1.1 04/23/2015 1113   CREATININE 0.82 07/17/2014 1000   CREATININE 0.9 10/20/2011 0903      Component Value Date/Time   CALCIUM 9.8 04/23/2015 1113   CALCIUM 8.9 07/17/2014 1000   CALCIUM 9.0 10/20/2011 0903  ALKPHOS 60 04/23/2015 1113   ALKPHOS 53 07/17/2014 1000   ALKPHOS 52 10/20/2011 0903   AST 28 04/23/2015 1113   AST 24 07/17/2014 1000   AST 24 10/20/2011 0903   ALT 28 04/23/2015 1113   ALT 28 07/17/2014 1000   ALT 31 10/20/2011 0903   BILITOT 0.47 04/23/2015 1113   BILITOT 0.8 07/17/2014 1000   BILITOT 0.60 10/20/2011 9641       RADIOGRAPHIC STUDIES:  ASSESSMENT AND PLAN: This is a very pleasant 70 years old white male with recurrent non-small cell lung cancer, adenocarcinoma with positive EGFR mutation in exon 21 that was initially diagnosed as stage IA non-small cell lung cancer status post right upper lobectomy and has been observation since October of 2007. He is status post 6 weeks treatment with Tarceva 150 mg by mouth daily according to the BMS checkmate 370 clinical trial. He is currently on Tarceva 100 mg by mouth daily for the last 9 months and tolerating it fairly well.  I recommended for the patient to continue his current treatment with Tarceva at the same dose. He would come back for follow-up visit in 2 weeks for reevaluation according to the study protocol after repeating blood work. He is expected to have repeat CT scan of the chest, abdomen and pelvis on 05/14/2015. The patient was advised to call immediately if he has any other  concerning symptoms in the interval.  All questions were answered. The patient knows to call the clinic with any problems, questions or concerns. We can certainly see the patient much sooner if necessary.  Disclaimer: This note was dictated with voice recognition software. Similar sounding words can inadvertently be transcribed and may not be corrected upon review.

## 2015-04-23 NOTE — Telephone Encounter (Signed)
Spoke with patient about March appointments. Patient will get schedule on next visit.

## 2015-04-23 NOTE — Progress Notes (Signed)
[facility] apprpved Tarceva 150 mg from 06/05/14 top 06/05/15.

## 2015-05-06 ENCOUNTER — Encounter: Payer: Self-pay | Admitting: Internal Medicine

## 2015-05-06 ENCOUNTER — Encounter: Payer: Self-pay | Admitting: *Deleted

## 2015-05-06 DIAGNOSIS — C3431 Malignant neoplasm of lower lobe, right bronchus or lung: Secondary | ICD-10-CM

## 2015-05-06 MED ORDER — ERLOTINIB HCL 100 MG PO TABS: 100.0000 mg | ORAL_TABLET | Freq: Every day | ORAL | Status: DC

## 2015-05-07 ENCOUNTER — Other Ambulatory Visit (HOSPITAL_COMMUNITY)
Admission: RE | Admit: 2015-05-07 | Discharge: 2015-05-07 | Disposition: A | Discharge status: Home / Self Care | Payer: Medicare Other | Source: Ambulatory Visit | Attending: Internal Medicine | Admitting: Internal Medicine

## 2015-05-07 ENCOUNTER — Other Ambulatory Visit: Payer: Self-pay | Admitting: *Deleted

## 2015-05-07 ENCOUNTER — Telehealth: Payer: Self-pay | Admitting: Internal Medicine

## 2015-05-07 ENCOUNTER — Encounter: Payer: Self-pay | Admitting: Oncology

## 2015-05-07 ENCOUNTER — Other Ambulatory Visit (HOSPITAL_BASED_OUTPATIENT_CLINIC_OR_DEPARTMENT_OTHER): Payer: Medicare Other

## 2015-05-07 ENCOUNTER — Ambulatory Visit (HOSPITAL_BASED_OUTPATIENT_CLINIC_OR_DEPARTMENT_OTHER): Payer: Medicare Other | Admitting: Internal Medicine

## 2015-05-07 ENCOUNTER — Encounter: Payer: Self-pay | Admitting: Internal Medicine

## 2015-05-07 VITALS — BP 116/66 | HR 66 | Temp 97.9°F | Resp 18 | Ht 72.0 in | Wt 244.0 lb

## 2015-05-07 DIAGNOSIS — C3491 Malignant neoplasm of unspecified part of right bronchus or lung: Secondary | ICD-10-CM | POA: Diagnosis not present

## 2015-05-07 DIAGNOSIS — Z006 Encounter for examination for normal comparison and control in clinical research program: Secondary | ICD-10-CM | POA: Diagnosis not present

## 2015-05-07 DIAGNOSIS — Z79899 Other long term (current) drug therapy: Secondary | ICD-10-CM

## 2015-05-07 DIAGNOSIS — C3431 Malignant neoplasm of lower lobe, right bronchus or lung: Secondary | ICD-10-CM

## 2015-05-07 LAB — COMPREHENSIVE METABOLIC PANEL
ALT: 31 U/L (ref 0–55)
ANION GAP: 9 meq/L (ref 3–11)
AST: 29 U/L (ref 5–34)
Albumin: 4 g/dL (ref 3.5–5.0)
Alkaline Phosphatase: 54 U/L (ref 40–150)
BUN: 17.6 mg/dL (ref 7.0–26.0)
CALCIUM: 9.4 mg/dL (ref 8.4–10.4)
CHLORIDE: 104 meq/L (ref 98–109)
CO2: 28 mEq/L (ref 22–29)
Creatinine: 1.1 mg/dL (ref 0.7–1.3)
EGFR: 67 mL/min/{1.73_m2} — ABNORMAL LOW (ref >90)
Glucose: 101 mg/dl (ref 70–140)
POTASSIUM: 3.8 meq/L (ref 3.5–5.1)
Sodium: 141 mEq/L (ref 136–145)
Total Bilirubin: 0.53 mg/dL (ref 0.20–1.20)
Total Protein: 6.7 g/dL (ref 6.4–8.3)

## 2015-05-07 LAB — MAGNESIUM: MAGNESIUM: 2.6 mg/dL — AB (ref 1.5–2.5)

## 2015-05-07 LAB — LACTATE DEHYDROGENASE: LDH: 196 U/L (ref 125–245)

## 2015-05-07 LAB — CBC WITH DIFFERENTIAL/PLATELET
BASO%: 0.8 % (ref 0.0–2.0)
BASOS ABS: 0 10*3/uL (ref 0.0–0.1)
EOS%: 4.1 % (ref 0.0–7.0)
Eosinophils Absolute: 0.3 10*3/uL (ref 0.0–0.5)
HEMATOCRIT: 40.6 % (ref 38.4–49.9)
HGB: 13.9 g/dL (ref 13.0–17.1)
LYMPH#: 2.4 10*3/uL (ref 0.9–3.3)
LYMPH%: 38.8 % (ref 14.0–49.0)
MCH: 33.8 pg — AB (ref 27.2–33.4)
MCHC: 34.3 g/dL (ref 32.0–36.0)
MCV: 98.6 fL — ABNORMAL HIGH (ref 79.3–98.0)
MONO#: 0.6 10*3/uL (ref 0.1–0.9)
MONO%: 10.4 % (ref 0.0–14.0)
NEUT#: 2.8 10*3/uL (ref 1.5–6.5)
NEUT%: 45.9 % (ref 39.0–75.0)
PLATELETS: 203 10*3/uL (ref 140–400)
RBC: 4.12 10*6/uL — ABNORMAL LOW (ref 4.20–5.82)
RDW: 13.2 % (ref 11.0–14.6)
WBC: 6.2 10*3/uL (ref 4.0–10.3)

## 2015-05-07 LAB — PHOSPHORUS: Phosphorus: 3.1 mg/dL (ref 2.5–4.6)

## 2015-05-07 LAB — TSH: TSH: 5.061 m(IU)/L — ABNORMAL HIGH (ref 0.320–4.118)

## 2015-05-07 NOTE — Progress Notes (Signed)
05/07/15 - BMS [id number] - Questionnaires (PRO's) - Patient into the cancer center for routine visit.  Patient given PRO's upon arrival to the cancer center.  Patient completed the PRO's (EQ-[id number] first and then FACT-L) before any study procedures were done.  The PRO's were checked for completeness.  The patient was thanked for his continued support in this clinical trial. [name] 05/07/15 - 9:00 am

## 2015-05-07 NOTE — Progress Notes (Signed)
05/07/2015 0915 BMS 370 cycle 25, week 48 The patient returns to clinic accompanied by his wife. He completed the PROs.  He had labs done and resting VS, including O2 sat. The patient has no complaints since the last visit. He stated that he has not missed taking any of the erlotinib. He also confirmed that he has not started any new medications since last visit. The patient stated his rash remains unchanged, still mild. His other toxicities listed are unchanged. The patient denied any new complaints or concerns today. The patient went to see his primary care MD re: elevated TSH back in January.  He stated Dr. Inda Merlin increased his Synthroid due to elevated TSH on 04/23/15.  The TSH today is elevated.  Per protocol, MD discretion as to do further testing.  Per  Dr. [name]  "the elevated TSH is not related to erlotinib.  Have patient contact Dr. Inda Merlin to manage the TSH".  Dr. [name] said not to do reflex T3 or T4 testing today.  The patient was informed and he stated he would call Dr. Inda Merlin and give him today's TSH value. Based on lab results review and targeted PE by Dr. [name], the patient was cleared to continue with treatment. This RN discussed the  Revised Protocol changes for version 03 dated 31-Dec-2014 with the patient.  This RN reviewed the entire consent form with the patient and pointed out the changes on the consent form.  This RN spent approximately 20 minutes during the entire re-consent process.  The patient was without any questions or concerns.  The patient signed the consent form version 2, dated 04/16/15.  A copy of the consent form was given to the patient.  This RN thanked the patient for his continued compliance and participation on the study.  He will be scheduled for a CT scan due next week.       05/07/2015 1511(late entry) During the re-consent process today, the patient agreed to participate in the optional tumor tissue biopsy when his cancer gets worse.  He verbalized  understanding that he could answer "no" to the question, but he stated "yes" he would do it.  He understood that he answered "no" to the question in the original consent form dated 05/30/14.  He understands that he can withdraw from the study at any time and that his participation is voluntary.  He had no further questions. Marcellus Scott, RN

## 2015-05-07 NOTE — Telephone Encounter (Signed)
per pof to sch pt appt-gave pt copy of avs °

## 2015-05-07 NOTE — Telephone Encounter (Signed)
per pof to sch pt appt-gave pt copy of avs-adv [facility] will call to sch trmt °

## 2015-05-07 NOTE — Progress Notes (Signed)
[place] [phone]   Fax:([phone]  OFFICE PROGRESS NOTE  Coleman,Andrew NEVILL, MD 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 Roxan Hockey on 01/23/2006.  2) Tarceva 150 mg by mouth daily as part of the BMS checkmate 370 clinical trial. Status post 6 weeks of treatment.   CURRENT THERAPY: Tarceva 100 mg by mouth daily as part of the BMS checkmate 370 clinical trial. Started 07/17/2014 Status post 9 months of treatment.  DISEASE STAGE: Recurrent  CHEMOTHERAPY INTENT: Palliative.  CURRENT # OF CHEMOTHERAPY CYCLES: 10  CURRENT ANTIEMETICS: None  CURRENT SMOKING STATUS: Non-smoker  ORAL CHEMOTHERAPY AND CONSENT: None  CURRENT BISPHOSPHONATES USE: None  LIVING WILL AND CODE STATUS: Full code.  INTERVAL HISTORY: [name] 70 y.o. male returns to the clinic today for routine 2 weeks followup visit accompanied by his wife. The patient is feeling fine today with no specific complaints. He has no significant complaints in the last visit. He denied having any skin rash or diarrhea. He denied having any significant chest pain, shortness of breath, cough or hemoptysis. The patient denied having any fever or chills, no nausea or vomiting. He denied having any significant weight loss or night sweats. He has repeat CBC and comprehensive metabolic panel performed earlier today and he is here for evaluation and discussion of his lab results.  MEDICAL HISTORY: Past Medical History  Diagnosis Date  . Hypertension   . Hypercholesterolemia   . Sinus disease     hx of  . History of pneumonia  last 3-4 yrs ago    3 -4  different times  . Hypothyroidism   . Sleep apnea     uses cpap setting opf 12  . History of agent Orange exposure 44-45 yrs ago  . Complication of anesthesia 2007    quit breathing with bronchoscopy, had to spend night  . lung ca dx'd 2007    lung right    ALLERGIES:  is allergic to lisinopril.  MEDICATIONS:  Current Outpatient Prescriptions  Medication Sig Dispense Refill  . amitriptyline (ELAVIL) 50 MG tablet Take 50 mg by mouth at bedtime.    [name] aspirin 81 MG tablet Take 81 mg by mouth every morning.     . cholecalciferol (VITAMIN D) 1000 UNITS tablet Take 2,000 Units by mouth daily.     . clindamycin (CLEOCIN-T) 1 % external solution Apply topically 2 (two) times daily. 240 mL 2  . clindamycin (CLINDAGEL) 1 % gel   1  . diltiazem (CARDIZEM CD) 360 MG 24 hr capsule Take 360 mg by mouth every morning.     . erlotinib (TARCEVA) 100 MG tablet Take 1 tablet (100 mg total) by mouth daily. Take on an empty stomach 1 hour before meals or 2 hours after.    . erlotinib (TARCEVA) 100 MG tablet Take 1 tablet (100 mg total) by mouth daily. Take on an empty stomach 1 hour before meals or 2 hours after.    . fluticasone (CUTIVATE) 0.05 % cream     . levETIRAcetam (KEPPRA) 750 MG tablet     . levothyroxine (SYNTHROID, LEVOTHROID) 75 MCG tablet     . metoCLOPramide (REGLAN)  10 MG tablet Take 10 mg by mouth every 6 (six) hours as needed for nausea.     . metoprolol succinate (TOPROL-XL) 25 MG 24 hr tablet Take 25 mg by mouth every morning.     . Multiple Vitamins-Minerals (CENTRUM SILVER PO) Take 1 tablet by mouth daily.     . naproxen sodium (ANAPROX) 550 MG tablet Take 550 mg by mouth 3 (three) times daily as needed for moderate pain.     . nitroGLYCERIN (NITROSTAT) 0.4 MG SL tablet Place 0.4 mg under the tongue every 5 (five) minutes as needed for chest pain.     . polyethylene glycol (MIRALAX / GLYCOLAX) packet Take 17 g by mouth daily.    . psyllium (METAMUCIL) 58.6 % powder Take 1 packet by  mouth 3 (three) times daily.    . rosuvastatin (CRESTOR) 5 MG tablet Take 5 mg by mouth every morning.     [name] TARCEVA 100 MG tablet TAKE 1 TABLET BY MOUTH DAILY. TAKE ON AN EMPTY STOMACH 1 HOUR BEFORE MEALS OR 2 HOURS AFTER. 30 tablet 2  . valsartan-hydrochlorothiazide (DIOVAN-HCT) 320-25 MG per tablet Take 1 tablet by mouth every morning.      No current facility-administered medications for this visit.    SURGICAL HISTORY:  Past Surgical History  Procedure Laterality Date  . Cystourethroscopy, gyrus turp.  04/16/2010  . Torn medial and lateral menisci, left knee.  11/06/2006  . Right video-assisted thoracoscopy, right upper lobectomy and  01/23/2006  . The olympus video bronchoscope was introduced via the right  12/21/2005  . Nasal sinus surgery  1987  . Tonsillectomy  1957  . Colonoscopy with propofol N/A 03/19/2013    Procedure: COLONOSCOPY WITH PROPOFOL;  Surgeon: Garlan Fair, MD;  Location: [facility];  Service: Endoscopy;  Laterality: N/A;    REVIEW OF SYSTEMS:  A comprehensive review of systems was negative.   PHYSICAL EXAMINATION: General appearance: alert, cooperative and no distress Head: Normocephalic, without obvious abnormality, atraumatic Neck: no adenopathy Lymph nodes: Cervical, supraclavicular, and axillary nodes normal. Resp: clear to auscultation bilaterally Back: symmetric, no curvature. ROM normal. No CVA tenderness. Cardio: regular rate and rhythm, S1, S2 normal, no murmur, click, rub or gallop GI: soft, non-tender; bowel sounds normal; no masses,  no organomegaly Extremities: extremities normal, atraumatic, no cyanosis or edema Neurologic: Alert and oriented X 3, normal strength and tone. Normal symmetric reflexes. Normal coordination and gait    ECOG PERFORMANCE STATUS: 0 - Asymptomatic  Blood pressure 116/66, pulse 66, temperature 97.9 F (36.6 C), temperature source Oral, resp. rate 18, height 6' (1.829 m), weight 244 lb (110.678 kg), SpO2 99  %.  LABORATORY DATA: Lab Results  Component Value Date   WBC 6.2 05/07/2015   HGB 13.9 05/07/2015   HCT 40.6 05/07/2015   MCV 98.6* 05/07/2015   PLT 203 05/07/2015      Chemistry      Component Value Date/Time   NA 140 04/23/2015 1113   NA 138 07/17/2014 1000   NA 139 10/20/2011 0903   K 4.0 04/23/2015 1113   K 3.3* 07/17/2014 1000   K 3.4 10/20/2011 0903   CL 107 07/17/2014 1000   CL 102 04/24/2012 0853   CL 101 10/20/2011 0903   CO2 28 04/23/2015 1113   CO2 26 07/17/2014 1000   CO2 29 10/20/2011 0903   BUN 14.3 04/23/2015 1113   BUN 16 07/17/2014 1000   BUN 12 10/20/2011 0903   CREATININE 1.1 04/23/2015 1113  CREATININE 0.82 07/17/2014 1000   CREATININE 0.9 10/20/2011 0903      Component Value Date/Time   CALCIUM 9.8 04/23/2015 1113   CALCIUM 8.9 07/17/2014 1000   CALCIUM 9.0 10/20/2011 0903   ALKPHOS 60 04/23/2015 1113   ALKPHOS 53 07/17/2014 1000   ALKPHOS 52 10/20/2011 0903   AST 28 04/23/2015 1113   AST 24 07/17/2014 1000   AST 24 10/20/2011 0903   ALT 28 04/23/2015 1113   ALT 28 07/17/2014 1000   ALT 31 10/20/2011 0903   BILITOT 0.47 04/23/2015 1113   BILITOT 0.8 07/17/2014 1000   BILITOT 0.60 10/20/2011 7331       RADIOGRAPHIC STUDIES:  ASSESSMENT AND PLAN: This is a very pleasant 70 years old white male with recurrent non-small cell lung cancer, adenocarcinoma with positive EGFR mutation in exon 21 that was initially diagnosed as stage IA non-small cell lung cancer status post right upper lobectomy and has been observation since October of 2007. He is status post 6 weeks treatment with Tarceva 150 mg by mouth daily according to the BMS checkmate 370 clinical trial. He is currently on Tarceva 100 mg by mouth daily for the last 9 months and and tolerating it fairly well was no significant skin rash, diarrhea or shortness of breath.  I recommended for the patient to continue his current treatment with Tarceva at the same dose. He would come back for  follow-up visit in 2 weeks for reevaluation according to the study protocol after repeating blood work and CT scan of the chest, abdomen and pelvis for restaging of his disease.. The patient was advised to call immediately if he has any other concerning symptoms in the interval.  All questions were answered. The patient knows to call the clinic with any problems, questions or concerns. We can certainly see the patient much sooner if necessary.  Disclaimer: This note was dictated with voice recognition software. Similar sounding words can inadvertently be transcribed and may not be corrected upon review.

## 2015-05-12 ENCOUNTER — Ambulatory Visit: Payer: Medicare Other | Admitting: Thoracic Surgery (Cardiothoracic Vascular Surgery)

## 2015-05-14 ENCOUNTER — Encounter (HOSPITAL_COMMUNITY): Payer: Self-pay

## 2015-05-14 ENCOUNTER — Ambulatory Visit (HOSPITAL_COMMUNITY)
Admission: RE | Admit: 2015-05-14 | Discharge: 2015-05-14 | Disposition: A | Discharge status: Home / Self Care | Payer: Medicare Other | Source: Ambulatory Visit | Attending: Internal Medicine | Admitting: Internal Medicine

## 2015-05-14 DIAGNOSIS — I7090 Unspecified atherosclerosis: Secondary | ICD-10-CM | POA: Diagnosis not present

## 2015-05-14 DIAGNOSIS — K760 Fatty (change of) liver, not elsewhere classified: Secondary | ICD-10-CM | POA: Insufficient documentation

## 2015-05-14 DIAGNOSIS — K8020 Calculus of gallbladder without cholecystitis without obstruction: Secondary | ICD-10-CM | POA: Diagnosis not present

## 2015-05-14 DIAGNOSIS — R911 Solitary pulmonary nodule: Secondary | ICD-10-CM | POA: Diagnosis not present

## 2015-05-14 DIAGNOSIS — C3431 Malignant neoplasm of lower lobe, right bronchus or lung: Secondary | ICD-10-CM | POA: Diagnosis not present

## 2015-05-14 MED ORDER — IOHEXOL 300 MG/ML  SOLN
100.0000 mL | Freq: Once | INTRAMUSCULAR | Status: AC | PRN
  Administered 2015-05-14: 100 mL via INTRAVENOUS

## 2015-05-19 ENCOUNTER — Ambulatory Visit (INDEPENDENT_AMBULATORY_CARE_PROVIDER_SITE_OTHER): Payer: Medicare Other | Admitting: Thoracic Surgery (Cardiothoracic Vascular Surgery)

## 2015-05-19 ENCOUNTER — Encounter: Payer: Self-pay | Admitting: Thoracic Surgery (Cardiothoracic Vascular Surgery)

## 2015-05-19 VITALS — BP 114/78 | HR 68 | Resp 16 | Ht 72.0 in | Wt 242.0 lb

## 2015-05-19 DIAGNOSIS — R918 Other nonspecific abnormal finding of lung field: Secondary | ICD-10-CM

## 2015-05-19 DIAGNOSIS — Z902 Acquired absence of lung [part of]: Secondary | ICD-10-CM | POA: Diagnosis not present

## 2015-05-19 DIAGNOSIS — C3411 Malignant neoplasm of upper lobe, right bronchus or lung: Secondary | ICD-10-CM

## 2015-05-19 NOTE — Progress Notes (Signed)
[address]       [place] [address]             [phone]      HPI: Mr. 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 He did well for many years until he developed some groundglass nodules in the right lung. These slowly grew over time. A needle biopsy showed adenocarcinoma. We discussed treatment options including surgery, radiation, and chemotherapy. He elected to be treated with Tarceva under a clinical trial and has been on that for about a year now. He originally was on 150 mg Tarceva daily, but that was decreased to 100 mg due to skin rash.  He feels well. He denies any unusual headaches or visual changes, new bone or joint pain, cough or hemoptysis, wheezing, change in appetite, weight loss, or adenopathy.  Past Medical History  Diagnosis Date  . Hypertension   . Hypercholesterolemia   . Sinus disease     hx of  . History of pneumonia  last 3-4 yrs ago    3 -4 different times  . Hypothyroidism   . Sleep apnea     uses cpap setting opf 12  . History of agent Orange exposure 44-45 yrs ago  . Complication of anesthesia 2007    quit breathing with bronchoscopy, had to spend night  . lung ca dx'd 2007    lung right      Current Outpatient Prescriptions  Medication Sig Dispense Refill  . amitriptyline (ELAVIL) 50 MG tablet Take 50 mg by mouth at bedtime.    [name] aspirin 81 MG tablet Take 81 mg by mouth every morning.     . cholecalciferol (VITAMIN D) 1000 UNITS tablet Take 2,000 Units by mouth daily.     . clindamycin (CLEOCIN-T) 1 % external solution Apply topically 2 (two) times daily. 240 mL 2  . clindamycin (CLINDAGEL) 1 % gel   1  . diltiazem (CARDIZEM CD) 360 MG 24 hr capsule Take 360 mg by mouth every morning.     . erlotinib (TARCEVA) 100 MG tablet Take 1 tablet (100 mg total) by mouth daily. Take on an empty stomach 1 hour before  meals or 2 hours after.    . fluticasone (CUTIVATE) 0.05 % cream     . levETIRAcetam (KEPPRA) 750 MG tablet     . levothyroxine (SYNTHROID, LEVOTHROID) 88 MCG tablet Take 88 mcg by mouth daily before breakfast.    . metoCLOPramide (REGLAN) 10 MG tablet Take 10 mg by mouth every 6 (six) hours as needed for nausea.     . metoprolol succinate (TOPROL-XL) 25 MG 24 hr tablet Take 25 mg by mouth every morning.     . Multiple Vitamins-Minerals (CENTRUM SILVER PO) Take 1 tablet by mouth daily.     . naproxen sodium (ANAPROX) 550 MG tablet Take 550 mg by mouth 3 (three) times daily as needed for moderate pain.     . nitroGLYCERIN (NITROSTAT) 0.4 MG SL tablet Place 0.4 mg under the tongue every 5 (five) minutes as needed for chest pain. Reported on 05/07/2015    . polyethylene glycol (MIRALAX / GLYCOLAX) packet Take 17 g by mouth daily.    . psyllium (METAMUCIL) 58.6 % powder Take 1 packet by mouth 3 (three) times daily.    . rosuvastatin (CRESTOR) 5 MG tablet Take  5 mg by mouth every morning.     . valsartan-hydrochlorothiazide (DIOVAN-HCT) 320-25 MG per tablet Take 1 tablet by mouth every morning.      No current facility-administered medications for this visit.    Physical Exam BP 114/78 mmHg  Pulse 68  Resp 16  Ht 6' (1.829 m)  Wt 242 lb (109.77 kg)  BMI 32.81 kg/m2  SpO25 27% 70 year old man in no acute distress Alert and oriented 3 with no focal deficits Well-developed and well-nourished No cervical or supraclavicular adenopathy Lungs clear with equal breath sounds bilaterally Cardiac regular rate and rhythm normal S1 and S2  Diagnostic Tests: CT CHEST, ABDOMEN, AND PELVIS WITH CONTRAST  TECHNIQUE:  Multidetector CT imaging of the chest, abdomen and pelvis was  performed following the standard protocol during bolus  administration of intravenous contrast.  CONTRAST: 175m OMNIPAQUE IOHEXOL 300 MG/ML SOLN  COMPARISON: CT of the chest, abdomen and pelvis 03/19/2015.  FINDINGS:    RECIST 1.1  Target Lesions:  1. Right lower lobe nodule measures 17 mm (image 22 of series 5),  unchanged.  2. Small linear lesion in the right lower lobe (image 24 of series  5) measures 11 mm (previously 16 mm) and is far less distinct than  the prior study, likely a benign area of scarring.  Non-target Lesions:  1. Previously described 6 mm sub solid nodule in the right lower  lobe is no longer clearly identified.  2. New 6 mm ground-glass attenuation nodule in the right lower lobe  (image 28 of series 5).  CT CHEST FINDINGS  Mediastinum/Lymph Nodes: Heart size is normal. There is no  significant pericardial fluid, thickening or pericardial  calcification. No pathologically enlarged mediastinal or hilar lymph  nodes. Esophagus is unremarkable in appearance. No axillary  lymphadenopathy.  Lungs/Pleura: Status post right upper lobectomy. Compensatory  hyperexpansion of the right lower and right middle lobes. Previously  demonstrated nodular area of architectural distortion in the right  lower lobe is stable in size compared to the prior examination  measuring 17 x 14 mm (image 22 of series 5). The other previously  described linear opacity in the periphery of the right lower lobe is  smaller than the prior study, measuring 11 mm on today's study, and  far less distinct than the prior study (image 24 of series 5),  favored to represent a benign area of linear scarring. Other  previously noted ground-glass attenuation nodule in the right lower  lobe is no longer evident. However, there is a new 6 mm ground-glass  attenuation nodule in the right lower lobe (image 28 of series 5),  which is nonspecific. No other larger more suspicious appearing  pulmonary nodules or masses are identified at this time. No acute  consolidative airspace disease. No pleural effusions.  Musculoskeletal/Soft Tissues: There are no aggressive appearing  lytic or blastic lesions noted in the visualized  portions of the  skeleton.  CT ABDOMEN AND PELVIS FINDINGS  Hepatobiliary: Mild diffuse decreased attenuation throughout the  hepatic parenchyma, compatible with hepatic steatosis. No cystic or  solid hepatic lesions. No intra or extrahepatic biliary ductal  dilatation. Large gallstone in the neck of the gallbladder is  partially calcified measuring 2.6 cm in diameter. No findings to  suggest an acute cholecystitis at this time.  Pancreas: No pancreatic mass. No pancreatic ductal dilatation. No  pancreatic or peripancreatic fluid or inflammatory changes.  Spleen: Unremarkable.  Adrenals/Urinary Tract: 16 mm exophytic low-attenuation lesion in  the anterior aspect of  the upper pole of the right kidney is  compatible with a simple cyst. Left kidney is normal in appearance.  Mild bilateral perinephric stranding (nonspecific), similar prior  studies. No hydroureteronephrosis to indicate urinary tract  obstruction at this time. Urinary bladder is normal in appearance.  Bilateral adrenal glands are normal in appearance.  Stomach/Bowel: Normal appearance of the stomach. No pathologic  dilatation of small bowel or colon. Normal appendix.  Vascular/Lymphatic: Atherosclerosis throughout the abdominal and  pelvic vasculature, without evidence of aneurysm or dissection. No  lymphadenopathy noted in the abdomen or pelvis.  Reproductive: Prostate gland and seminal vesicles are unremarkable  in appearance.  Other: New focus of haziness in the fat immediately anterior to the  distal descending colon (image 90 of series 2), suspicious for a  small focus of fat necrosis. This does not appear to be associated  with diverticular disease, and is more displaced from the colon than  is typically seen with epiploic appendagitis. 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 No significant volume of ascites.  No pneumoperitoneum.  Musculoskeletal: Small lucent lesion with well-defined sclerotic    margins and narrow zone of transition measuring 9 mm in the left  femoral neck (image 134 of series 2), unchanged, favored to be  benign. There are no aggressive appearing lytic or blastic lesions  noted in the visualized portions of the skeleton.  IMPRESSION:  1. Dominant nodule in the right lower lobe is unchanged compared to  the prior examination, while the other 2 previously described non  target lesions in the right lower lobe have gotten smaller and  resolved.  2. New 6 mm ground-glass attenuation nodule in the posterior aspect  of the right lower lobe noted on today's examination is highly  nonspecific. Attention on followup studies is recommended to ensure  the stability or resolution of this finding.  3. No signs of metastatic disease in the abdomen or pelvis.  4. 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  5. Hepatic steatosis.  6. Cholelithiasis.  7. Atherosclerosis.  8. Additional incidental findings, as above.  Electronically Signed  By: Vinnie Langton M.D.  On: 05/14/2015 09:01  I personally reviewed the chest CT portion of the scan above and concur with findings as noted in the chest.  Impression: 70 year old man with a remote history of a stage IA non-small cell carcinoma. He had a right upper lobectomy in 2007. He then developed multiple groundglass nodules one of which was biopsied and proven to be non-small cell carcinoma. He did not want any additional surgery and opted to be treated with Tarceva. He was initially started on 150 mg daily but that was decreased to 100 mg daily due to skin rash. He is tolerating that this well.  His CT today shows the majority of his disease is unchanged. There is a new 6 mm nodule in the right lower lobe that was not present on his previous scan. This will bear continued follow-up.  Plan: Return in 6 months  I spent 10  minutes with Mr. Cauble during this visit, greater than 50% of the time was spent in counseling.  Melrose Nakayama, MD [facility] [phone]

## 2015-05-20 ENCOUNTER — Encounter: Payer: Self-pay | Admitting: *Deleted

## 2015-05-20 ENCOUNTER — Encounter: Payer: Self-pay | Admitting: Internal Medicine

## 2015-05-20 DIAGNOSIS — C3431 Malignant neoplasm of lower lobe, right bronchus or lung: Secondary | ICD-10-CM

## 2015-05-20 MED ORDER — ERLOTINIB HCL 100 MG PO TABS: 100.0000 mg | ORAL_TABLET | Freq: Every day | ORAL | Status: DC

## 2015-05-21 ENCOUNTER — Other Ambulatory Visit (HOSPITAL_BASED_OUTPATIENT_CLINIC_OR_DEPARTMENT_OTHER): Payer: Medicare Other

## 2015-05-21 ENCOUNTER — Other Ambulatory Visit (HOSPITAL_COMMUNITY)
Admission: RE | Admit: 2015-05-21 | Discharge: 2015-05-21 | Disposition: A | Discharge status: Home / Self Care | Payer: Medicare Other | Source: Ambulatory Visit | Attending: Internal Medicine | Admitting: Internal Medicine

## 2015-05-21 ENCOUNTER — Encounter: Payer: Self-pay | Admitting: Internal Medicine

## 2015-05-21 ENCOUNTER — Telehealth: Payer: Self-pay | Admitting: Internal Medicine

## 2015-05-21 ENCOUNTER — Ambulatory Visit (HOSPITAL_BASED_OUTPATIENT_CLINIC_OR_DEPARTMENT_OTHER): Payer: Medicare Other | Admitting: Internal Medicine

## 2015-05-21 VITALS — BP 117/70 | HR 65 | Temp 97.7°F | Resp 18 | Ht 72.0 in | Wt 243.0 lb

## 2015-05-21 DIAGNOSIS — C3431 Malignant neoplasm of lower lobe, right bronchus or lung: Secondary | ICD-10-CM

## 2015-05-21 DIAGNOSIS — R918 Other nonspecific abnormal finding of lung field: Secondary | ICD-10-CM | POA: Diagnosis not present

## 2015-05-21 DIAGNOSIS — Z006 Encounter for examination for normal comparison and control in clinical research program: Secondary | ICD-10-CM

## 2015-05-21 DIAGNOSIS — Z5111 Encounter for antineoplastic chemotherapy: Secondary | ICD-10-CM

## 2015-05-21 DIAGNOSIS — C3491 Malignant neoplasm of unspecified part of right bronchus or lung: Secondary | ICD-10-CM

## 2015-05-21 LAB — COMPREHENSIVE METABOLIC PANEL
ALT: 32 U/L (ref 0–55)
ANION GAP: 7 meq/L (ref 3–11)
AST: 30 U/L (ref 5–34)
Albumin: 4 g/dL (ref 3.5–5.0)
Alkaline Phosphatase: 61 U/L (ref 40–150)
BILIRUBIN TOTAL: 0.53 mg/dL (ref 0.20–1.20)
BUN: 14.3 mg/dL (ref 7.0–26.0)
CALCIUM: 9.5 mg/dL (ref 8.4–10.4)
CHLORIDE: 104 meq/L (ref 98–109)
CO2: 29 mEq/L (ref 22–29)
CREATININE: 1.1 mg/dL (ref 0.7–1.3)
EGFR: 69 mL/min/{1.73_m2} — AB (ref >90)
Glucose: 98 mg/dl (ref 70–140)
Potassium: 3.8 mEq/L (ref 3.5–5.1)
Sodium: 140 mEq/L (ref 136–145)
Total Protein: 6.9 g/dL (ref 6.4–8.3)

## 2015-05-21 LAB — CBC WITH DIFFERENTIAL/PLATELET
BASO%: 0.5 % (ref 0.0–2.0)
BASOS ABS: 0 10*3/uL (ref 0.0–0.1)
EOS ABS: 0.3 10*3/uL (ref 0.0–0.5)
EOS%: 4.2 % (ref 0.0–7.0)
HEMATOCRIT: 41.9 % (ref 38.4–49.9)
HGB: 14.1 g/dL (ref 13.0–17.1)
LYMPH#: 2.4 10*3/uL (ref 0.9–3.3)
LYMPH%: 38.5 % (ref 14.0–49.0)
MCH: 33.5 pg — ABNORMAL HIGH (ref 27.2–33.4)
MCHC: 33.7 g/dL (ref 32.0–36.0)
MCV: 99.6 fL — AB (ref 79.3–98.0)
MONO#: 0.6 10*3/uL (ref 0.1–0.9)
MONO%: 10.5 % (ref 0.0–14.0)
NEUT#: 2.8 10*3/uL (ref 1.5–6.5)
NEUT%: 46.3 % (ref 39.0–75.0)
PLATELETS: 209 10*3/uL (ref 140–400)
RBC: 4.21 10*6/uL (ref 4.20–5.82)
RDW: 13.3 % (ref 11.0–14.6)
WBC: 6.1 10*3/uL (ref 4.0–10.3)

## 2015-05-21 LAB — MAGNESIUM: Magnesium: 2.6 mg/dl — ABNORMAL HIGH (ref 1.5–2.5)

## 2015-05-21 LAB — PHOSPHORUS: Phosphorus: 2.9 mg/dL (ref 2.5–4.6)

## 2015-05-21 LAB — LACTATE DEHYDROGENASE: LDH: 193 U/L (ref 125–245)

## 2015-05-21 MED FILL — TARCEVA 100 MG TABLET: 100 | 30 days supply | Qty: 30 | Fill #1

## 2015-05-21 NOTE — Progress Notes (Signed)
[place] [phone]   [phone]  OFFICE PROGRESS NOTE  [name],[name], MD 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 Roxan Hockey on 01/23/2006.  2) Tarceva 150 mg by mouth daily as part of the BMS checkmate 370 clinical trial. Status post 6 weeks of treatment.   CURRENT THERAPY: Tarceva 100 mg by mouth daily as part of the BMS checkmate 370 clinical trial. Started 07/17/2014 Status post 10 months of treatment.  DISEASE STAGE: Recurrent  CHEMOTHERAPY INTENT: Palliative.  CURRENT # OF CHEMOTHERAPY CYCLES: 11  CURRENT ANTIEMETICS: None  CURRENT SMOKING STATUS: Non-smoker  ORAL CHEMOTHERAPY AND CONSENT: None  CURRENT BISPHOSPHONATES USE: None  LIVING WILL AND CODE STATUS: Full code.  INTERVAL HISTORY: [name] 70 y.o. male returns to the clinic today for followup visit accompanied by his wife. The patient is feeling fine today with no specific complaints. He denied having any skin rash or diarrhea. He denied having any significant chest pain, shortness of breath, cough or hemoptysis. The patient denied having any fever or chills, no nausea, vomiting, abdominal pain, diarrhea or constipation. He denied having any significant weight loss or night sweats. He has repeat CBC and comprehensive metabolic panel performed earlier today as well as CT scan of the chest, abdomen and pelvis performed last week and he is here for evaluation and discussion of his lab and his scan results.  MEDICAL HISTORY: Past Medical History  Diagnosis Date  . Hypertension   . Hypercholesterolemia   . Sinus disease     hx of    . History of pneumonia  last 3-4 yrs ago    3 -4 different times  . Hypothyroidism   . Sleep apnea     uses cpap setting opf 12  . History of agent Orange exposure 44-45 yrs ago  . Complication of anesthesia 2007    quit breathing with bronchoscopy, had to spend night  . lung ca dx'd 2007    lung right    ALLERGIES:  is allergic to lisinopril.  MEDICATIONS:  Current Outpatient Prescriptions  Medication Sig Dispense Refill  . amitriptyline (ELAVIL) 50 MG tablet Take 50 mg by mouth at bedtime.    [name] aspirin 81 MG tablet Take 81 mg by mouth every morning.     . cholecalciferol (VITAMIN D) 1000 UNITS tablet Take 2,000 Units by mouth daily.     . clindamycin (CLEOCIN-T) 1 % external solution Apply topically 2 (two) times daily. 240 mL 2  . clindamycin (CLINDAGEL) 1 % gel   1  . diltiazem (CARDIZEM CD) 360 MG 24 hr capsule Take 360 mg by mouth every morning.     . erlotinib (TARCEVA) 100 MG tablet Take 1 tablet (100 mg total) by mouth daily. Take on an empty stomach 1 hour before meals or 2 hours after.    . fluticasone (CUTIVATE) 0.05 % cream     . levETIRAcetam (KEPPRA) 750 MG tablet     . levothyroxine (SYNTHROID, LEVOTHROID) 88 MCG tablet Take 88 mcg by mouth daily before breakfast.    . metoCLOPramide (REGLAN) 10 MG tablet Take 10 mg by mouth every 6 (six) hours as needed  for nausea.     . metoprolol succinate (TOPROL-XL) 25 MG 24 hr tablet Take 25 mg by mouth every morning.     . Multiple Vitamins-Minerals (CENTRUM SILVER PO) Take 1 tablet by mouth daily.     . naproxen sodium (ANAPROX) 550 MG tablet Take 550 mg by mouth 3 (three) times daily as needed for moderate pain.     . polyethylene glycol (MIRALAX / GLYCOLAX) packet Take 17 g by mouth daily.    . psyllium (METAMUCIL) 58.6 % powder Take 1 packet by mouth 3 (three) times daily.    . rosuvastatin (CRESTOR) 5 MG tablet Take 5 mg by mouth every morning.     . valsartan-hydrochlorothiazide (DIOVAN-HCT) 320-25 MG per tablet Take 1  tablet by mouth every morning.     . nitroGLYCERIN (NITROSTAT) 0.4 MG SL tablet Place 0.4 mg under the tongue every 5 (five) minutes as needed for chest pain. Reported on 05/21/2015     No current facility-administered medications for this visit.    SURGICAL HISTORY:  Past Surgical History  Procedure Laterality Date  . Cystourethroscopy, gyrus turp.  04/16/2010  . Torn medial and lateral menisci, left knee.  11/06/2006  . Right video-assisted thoracoscopy, right upper lobectomy and  01/23/2006  . The olympus video bronchoscope was introduced via the right  12/21/2005  . Nasal sinus surgery  1987  . Tonsillectomy  1957  . Colonoscopy with propofol N/A 03/19/2013    Procedure: COLONOSCOPY WITH PROPOFOL;  Surgeon: Garlan Fair, MD;  Location: [facility];  Service: Endoscopy;  Laterality: N/A;    REVIEW OF SYSTEMS:  Constitutional: negative Eyes: negative Ears, nose, mouth, throat, and face: negative Respiratory: negative Cardiovascular: negative Gastrointestinal: negative Genitourinary:negative Integument/breast: negative Hematologic/lymphatic: negative Musculoskeletal:negative Neurological: negative Behavioral/Psych: negative Endocrine: negative Allergic/Immunologic: negative   PHYSICAL EXAMINATION: General appearance: alert, cooperative and no distress Head: Normocephalic, without obvious abnormality, atraumatic Neck: no adenopathy Lymph nodes: Cervical, supraclavicular, and axillary nodes normal. Resp: clear to auscultation bilaterally Back: symmetric, no curvature. ROM normal. No CVA tenderness. Cardio: regular rate and rhythm, S1, S2 normal, no murmur, click, rub or gallop GI: soft, non-tender; bowel sounds normal; no masses,  no organomegaly Extremities: extremities normal, atraumatic, no cyanosis or edema Neurologic: Alert and oriented X 3, normal strength and tone. Normal symmetric reflexes. Normal coordination and gait    ECOG PERFORMANCE STATUS: 0 -  Asymptomatic  Blood pressure 117/70, pulse 65, temperature 97.7 F (36.5 C), temperature source Oral, resp. rate 18, height 6' (1.829 m), weight 243 lb (110.224 kg), SpO2 98 %.  LABORATORY DATA: Lab Results  Component Value Date   WBC 6.1 05/21/2015   HGB 14.1 05/21/2015   HCT 41.9 05/21/2015   MCV 99.6* 05/21/2015   PLT 209 05/21/2015      Chemistry      Component Value Date/Time   NA 141 05/07/2015 0835   NA 138 07/17/2014 1000   NA 139 10/20/2011 0903   K 3.8 05/07/2015 0835   K 3.3* 07/17/2014 1000   K 3.4 10/20/2011 0903   CL 107 07/17/2014 1000   CL 102 04/24/2012 0853   CL 101 10/20/2011 0903   CO2 28 05/07/2015 0835   CO2 26 07/17/2014 1000   CO2 29 10/20/2011 0903   BUN 17.6 05/07/2015 0835   BUN 16 07/17/2014 1000   BUN 12 10/20/2011 0903   CREATININE 1.1 05/07/2015 0835   CREATININE 0.82 07/17/2014 1000   CREATININE 0.9 10/20/2011 5366  Component Value Date/Time   CALCIUM 9.4 05/07/2015 0835   CALCIUM 8.9 07/17/2014 1000   CALCIUM 9.0 10/20/2011 0903   ALKPHOS 54 05/07/2015 0835   ALKPHOS 53 07/17/2014 1000   ALKPHOS 52 10/20/2011 0903   AST 29 05/07/2015 0835   AST 24 07/17/2014 1000   AST 24 10/20/2011 0903   ALT 31 05/07/2015 0835   ALT 28 07/17/2014 1000   ALT 31 10/20/2011 0903   BILITOT 0.53 05/07/2015 0835   BILITOT 0.8 07/17/2014 1000   BILITOT 0.60 10/20/2011 0903       RADIOGRAPHIC STUDIES: Ct Chest W Contrast  05/14/2015  CLINICAL DATA:  70 year old male with history of lung cancer status. History of right upper lobe lesions status post right upper lobectomy in 2007, with recurrence. RECIST protocol EXAM: CT CHEST, ABDOMEN, AND PELVIS WITH CONTRAST TECHNIQUE: Multidetector CT imaging of the chest, abdomen and pelvis was performed following the standard protocol during bolus administration of intravenous contrast. CONTRAST:  172m OMNIPAQUE IOHEXOL 300 MG/ML  SOLN COMPARISON:  CT of the chest, abdomen and pelvis 03/19/2015. FINDINGS:  RECIST 1.1 Target Lesions: 1. Right lower lobe nodule measures 17 mm (image 22 of series 5), unchanged. 2. Small linear lesion in the right lower lobe (image 24 of series 5) measures 11 mm (previously 16 mm) and is far less distinct than the prior study, likely a benign area of scarring. Non-target Lesions: 1. Previously described 6 mm sub solid nodule in the right lower lobe is no longer clearly identified. 2. New 6 mm ground-glass attenuation nodule in the right lower lobe (image 28 of series 5). CT CHEST FINDINGS Mediastinum/Lymph Nodes: Heart size is normal. There is no significant pericardial fluid, thickening or pericardial calcification. No pathologically enlarged mediastinal or hilar lymph nodes. Esophagus is unremarkable in appearance. No axillary lymphadenopathy. Lungs/Pleura: Status post right upper lobectomy. Compensatory hyperexpansion of the right lower and right middle lobes. Previously demonstrated nodular area of architectural distortion in the right lower lobe is stable in size compared to the prior examination measuring 17 x 14 mm (image 22 of series 5). The other previously described linear opacity in the periphery of the right lower lobe is smaller than the prior study, measuring 11 mm on today's study, and far less distinct than the prior study (image 24 of series 5), favored to represent a benign area of linear scarring. Other previously noted ground-glass attenuation nodule in the right lower lobe is no longer evident. However, there is a new 6 mm ground-glass attenuation nodule in the right lower lobe (image 28 of series 5), which is nonspecific. No other larger more suspicious appearing pulmonary nodules or masses are identified at this time. No acute consolidative airspace disease. No pleural effusions. Musculoskeletal/Soft Tissues: There are no aggressive appearing lytic or blastic lesions noted in the visualized portions of the skeleton. CT ABDOMEN AND PELVIS FINDINGS Hepatobiliary: Mild  diffuse decreased attenuation throughout the hepatic parenchyma, compatible with hepatic steatosis. No cystic or solid hepatic lesions. No intra or extrahepatic biliary ductal dilatation. Large gallstone in the neck of the gallbladder is partially calcified measuring 2.6 cm in diameter. No findings to suggest an acute cholecystitis at this time. Pancreas: No pancreatic mass. No pancreatic ductal dilatation. No pancreatic or peripancreatic fluid or inflammatory changes. Spleen: Unremarkable. Adrenals/Urinary Tract: 16 mm exophytic low-attenuation lesion in the anterior aspect of the upper pole of the right kidney is compatible with a simple cyst. Left kidney is normal in appearance. Mild bilateral perinephric stranding (nonspecific), similar  prior studies. No hydroureteronephrosis to indicate urinary tract obstruction at this time. Urinary bladder is normal in appearance. Bilateral adrenal glands are normal in appearance. Stomach/Bowel: Normal appearance of the stomach. No pathologic dilatation of small bowel or colon. Normal appendix. Vascular/Lymphatic: Atherosclerosis throughout the abdominal and pelvic vasculature, without evidence of aneurysm or dissection. No lymphadenopathy noted in the abdomen or pelvis. Reproductive: Prostate gland and seminal vesicles are unremarkable in appearance. Other: New focus of haziness in the fat immediately anterior to the distal descending colon (image 90 of series 2), suspicious for a small focus of fat necrosis. This does not appear to be associated with diverticular disease, and is more displaced from the colon than is typically seen with epiploic appendagitis. This may simply represent a small omental infarct. No significant volume of ascites. No pneumoperitoneum. Musculoskeletal: Small lucent lesion with well-defined sclerotic margins and narrow zone of transition measuring 9 mm in the left femoral neck (image 134 of series 2), unchanged, favored to be benign. There are no  aggressive appearing lytic or blastic lesions noted in the visualized portions of the skeleton. IMPRESSION: 1. Dominant nodule in the right lower lobe is unchanged compared to the prior examination, while the other 2 previously described non target lesions in the right lower lobe have gotten smaller and resolved. 2. New 6 mm ground-glass attenuation nodule in the posterior aspect of the right lower lobe noted on today's examination is highly nonspecific. Attention on followup studies is recommended to ensure the stability or resolution of this finding. 3. No signs of metastatic disease in the abdomen or pelvis. 4. 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 5. Hepatic steatosis. 6. Cholelithiasis. 7. Atherosclerosis. 8. Additional incidental findings, as above. Electronically Signed   By: Vinnie Langton M.D.   On: 05/14/2015 09:01   Ct Abdomen Pelvis W Contrast  05/14/2015  CLINICAL DATA:  70 year old male with history of lung cancer status. History of right upper lobe lesions status post right upper lobectomy in 2007, with recurrence. RECIST protocol EXAM: CT CHEST, ABDOMEN, AND PELVIS WITH CONTRAST TECHNIQUE: Multidetector CT imaging of the chest, abdomen and pelvis was performed following the standard protocol during bolus administration of intravenous contrast. CONTRAST:  129m OMNIPAQUE IOHEXOL 300 MG/ML  SOLN COMPARISON:  CT of the chest, abdomen and pelvis 03/19/2015. FINDINGS: RECIST 1.1 Target Lesions: 1. Right lower lobe nodule measures 17 mm (image 22 of series 5), unchanged. 2. Small linear lesion in the right lower lobe (image 24 of series 5) measures 11 mm (previously 16 mm) and is far less distinct than the prior study, likely a benign area of scarring. Non-target Lesions: 1. Previously described 6 mm sub solid nodule in the right lower lobe is no longer clearly identified. 2. New 6 mm  ground-glass attenuation nodule in the right lower lobe (image 28 of series 5). CT CHEST FINDINGS Mediastinum/Lymph Nodes: Heart size is normal. There is no significant pericardial fluid, thickening or pericardial calcification. No pathologically enlarged mediastinal or hilar lymph nodes. Esophagus is unremarkable in appearance. No axillary lymphadenopathy. Lungs/Pleura: Status post right upper lobectomy. Compensatory hyperexpansion of the right lower and right middle lobes. Previously demonstrated nodular area of architectural distortion in the right lower lobe is stable in size compared to the prior examination measuring 17 x 14 mm (image 22 of series 5). The other previously described linear opacity in the periphery of the right lower lobe  is smaller than the prior study, measuring 11 mm on today's study, and far less distinct than the prior study (image 24 of series 5), favored to represent a benign area of linear scarring. Other previously noted ground-glass attenuation nodule in the right lower lobe is no longer evident. However, there is a new 6 mm ground-glass attenuation nodule in the right lower lobe (image 28 of series 5), which is nonspecific. No other larger more suspicious appearing pulmonary nodules or masses are identified at this time. No acute consolidative airspace disease. No pleural effusions. Musculoskeletal/Soft Tissues: There are no aggressive appearing lytic or blastic lesions noted in the visualized portions of the skeleton. CT ABDOMEN AND PELVIS FINDINGS Hepatobiliary: Mild diffuse decreased attenuation throughout the hepatic parenchyma, compatible with hepatic steatosis. No cystic or solid hepatic lesions. No intra or extrahepatic biliary ductal dilatation. Large gallstone in the neck of the gallbladder is partially calcified measuring 2.6 cm in diameter. No findings to suggest an acute cholecystitis at this time. Pancreas: No pancreatic mass. No pancreatic ductal dilatation. No  pancreatic or peripancreatic fluid or inflammatory changes. Spleen: Unremarkable. Adrenals/Urinary Tract: 16 mm exophytic low-attenuation lesion in the anterior aspect of the upper pole of the right kidney is compatible with a simple cyst. Left kidney is normal in appearance. Mild bilateral perinephric stranding (nonspecific), similar prior studies. No hydroureteronephrosis to indicate urinary tract obstruction at this time. Urinary bladder is normal in appearance. Bilateral adrenal glands are normal in appearance. Stomach/Bowel: Normal appearance of the stomach. No pathologic dilatation of small bowel or colon. Normal appendix. Vascular/Lymphatic: Atherosclerosis throughout the abdominal and pelvic vasculature, without evidence of aneurysm or dissection. No lymphadenopathy noted in the abdomen or pelvis. Reproductive: Prostate gland and seminal vesicles are unremarkable in appearance. Other: New focus of haziness in the fat immediately anterior to the distal descending colon (image 90 of series 2), suspicious for a small focus of fat necrosis. This does not appear to be associated with diverticular disease, and is more displaced from the colon than is typically seen with epiploic appendagitis. This may simply represent a small omental infarct. No significant volume of ascites. No pneumoperitoneum. Musculoskeletal: Small lucent lesion with well-defined sclerotic margins and narrow zone of transition measuring 9 mm in the left femoral neck (image 134 of series 2), unchanged, favored to be benign. There are no aggressive appearing lytic or blastic lesions noted in the visualized portions of the skeleton. IMPRESSION: 1. Dominant nodule in the right lower lobe is unchanged compared to the prior examination, while the other 2 previously described non target lesions in the right lower lobe have gotten smaller and resolved. 2. New 6 mm ground-glass attenuation nodule in the posterior aspect of the right lower lobe noted on  today's examination is highly nonspecific. Attention on followup studies is recommended to ensure the stability or resolution of this finding. 3. No signs of metastatic disease in the abdomen or pelvis. 4. 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 5. Hepatic steatosis. 6. Cholelithiasis. 7. Atherosclerosis. 8. Additional incidental findings, as above. Electronically Signed   By: Vinnie Langton M.D.   On: 05/14/2015 09:01   ASSESSMENT AND PLAN: This is a very pleasant 70 years old white male with recurrent non-small cell lung cancer, adenocarcinoma with positive EGFR mutation in exon 21 that was initially diagnosed as stage IA non-small cell lung cancer status post right upper lobectomy and has been  observation since October of 2007. He is status post 6 weeks treatment with Tarceva 150 mg by mouth daily according to the BMS checkmate 370 clinical trial. He is currently on Tarceva 100 mg by mouth daily for the last 10 months and and tolerating it fairly well was no significant skin rash, diarrhea. The recent CT scan of the chest showed no concerning finding for disease progression except for new 6 mm right lower lobe pulmonary nodules that is highly nonspecific and require close attention on upcoming scan. The scan also showed new area of fat necrosis in the left lower quadrant questionable for omental infarct but the patient is currently asymptomatic. I recommended for the patient to continue his current treatment with Tarceva at the same dose. He would come back for follow-up visit in 2 weeks for reevaluation according to the study protocol. The patient was advised to call immediately if he has any other concerning symptoms in the interval.  All questions were answered. The patient knows to call the clinic with any problems, questions or concerns. We can certainly see the patient much sooner if  necessary.  Disclaimer: This note was dictated with voice recognition software. Similar sounding words can inadvertently be transcribed and may not be corrected upon review.

## 2015-05-21 NOTE — Progress Notes (Signed)
05/21/2015 0850  BMS 370 Group D, Arm A The patient comes in with his wife to the clinic.  He states he is doing well and has had no changes in his side effects or his medications.  He reports not missing any of his study medication. He said he called Dr. Inda Merlin two weeks ago about the elevated TSH level from 05/07/15.  He said Dr. Inda Merlin is aware and is monitoring.   Dr. [name] was in to see the patient and perform a targeted PE.  Dr. [name] reviewed the CT scan results from 05/14/15.  Based on PE, CT results, and lab results, Dr. [name] cleared the patient to continue protocol treatment,. The patient had no further questions.  This RN thanked the patient for his commitment to the study.

## 2015-05-21 NOTE — Telephone Encounter (Signed)
per pof to sch pt appt-gave pt copy of avs °

## 2015-06-03 ENCOUNTER — Encounter: Payer: Self-pay | Admitting: *Deleted

## 2015-06-03 ENCOUNTER — Encounter: Payer: Self-pay | Admitting: Internal Medicine

## 2015-06-03 DIAGNOSIS — Z79899 Other long term (current) drug therapy: Secondary | ICD-10-CM

## 2015-06-03 DIAGNOSIS — C3431 Malignant neoplasm of lower lobe, right bronchus or lung: Secondary | ICD-10-CM

## 2015-06-04 ENCOUNTER — Ambulatory Visit (HOSPITAL_BASED_OUTPATIENT_CLINIC_OR_DEPARTMENT_OTHER): Payer: Medicare Other | Admitting: Internal Medicine

## 2015-06-04 ENCOUNTER — Encounter: Payer: Self-pay | Admitting: Internal Medicine

## 2015-06-04 ENCOUNTER — Other Ambulatory Visit: Payer: Self-pay | Admitting: Medical Oncology

## 2015-06-04 ENCOUNTER — Other Ambulatory Visit (HOSPITAL_COMMUNITY)
Admission: RE | Admit: 2015-06-04 | Discharge: 2015-06-04 | Disposition: A | Discharge status: Home / Self Care | Payer: Medicare Other | Source: Ambulatory Visit | Attending: Internal Medicine | Admitting: Internal Medicine

## 2015-06-04 ENCOUNTER — Other Ambulatory Visit (HOSPITAL_BASED_OUTPATIENT_CLINIC_OR_DEPARTMENT_OTHER): Payer: Medicare Other

## 2015-06-04 VITALS — BP 114/65 | HR 66 | Temp 97.7°F | Resp 18 | Ht 72.0 in | Wt 242.3 lb

## 2015-06-04 DIAGNOSIS — C3431 Malignant neoplasm of lower lobe, right bronchus or lung: Secondary | ICD-10-CM

## 2015-06-04 DIAGNOSIS — Z006 Encounter for examination for normal comparison and control in clinical research program: Secondary | ICD-10-CM | POA: Diagnosis not present

## 2015-06-04 DIAGNOSIS — Z5111 Encounter for antineoplastic chemotherapy: Secondary | ICD-10-CM

## 2015-06-04 LAB — COMPREHENSIVE METABOLIC PANEL
ALT: 27 U/L (ref 0–55)
AST: 27 U/L (ref 5–34)
Albumin: 3.9 g/dL (ref 3.5–5.0)
Alkaline Phosphatase: 55 U/L (ref 40–150)
Anion Gap: 8 mEq/L (ref 3–11)
BILIRUBIN TOTAL: 0.5 mg/dL (ref 0.20–1.20)
BUN: 13.9 mg/dL (ref 7.0–26.0)
CO2: 26 meq/L (ref 22–29)
CREATININE: 1.1 mg/dL (ref 0.7–1.3)
Calcium: 9.4 mg/dL (ref 8.4–10.4)
Chloride: 104 mEq/L (ref 98–109)
EGFR: 65 mL/min/{1.73_m2} — AB (ref >90)
Glucose: 101 mg/dl (ref 70–140)
Potassium: 3.8 mEq/L (ref 3.5–5.1)
Sodium: 138 mEq/L (ref 136–145)
TOTAL PROTEIN: 6.7 g/dL (ref 6.4–8.3)

## 2015-06-04 LAB — CBC WITH DIFFERENTIAL/PLATELET
BASO%: 0.4 % (ref 0.0–2.0)
BASOS ABS: 0 10*3/uL (ref 0.0–0.1)
EOS ABS: 0.2 10*3/uL (ref 0.0–0.5)
EOS%: 3 % (ref 0.0–7.0)
HCT: 40.3 % (ref 38.4–49.9)
HEMOGLOBIN: 13.9 g/dL (ref 13.0–17.1)
LYMPH%: 39.1 % (ref 14.0–49.0)
MCH: 34.2 pg — AB (ref 27.2–33.4)
MCHC: 34.6 g/dL (ref 32.0–36.0)
MCV: 98.8 fL — AB (ref 79.3–98.0)
MONO#: 0.6 10*3/uL (ref 0.1–0.9)
MONO%: 10 % (ref 0.0–14.0)
NEUT%: 47.5 % (ref 39.0–75.0)
NEUTROS ABS: 2.9 10*3/uL (ref 1.5–6.5)
PLATELETS: 204 10*3/uL (ref 140–400)
RBC: 4.08 10*6/uL — ABNORMAL LOW (ref 4.20–5.82)
RDW: 13.2 % (ref 11.0–14.6)
WBC: 6.1 10*3/uL (ref 4.0–10.3)
lymph#: 2.4 10*3/uL (ref 0.9–3.3)

## 2015-06-04 LAB — PHOSPHORUS: PHOSPHORUS: 2.8 mg/dL (ref 2.5–4.6)

## 2015-06-04 LAB — LACTATE DEHYDROGENASE: LDH: 185 U/L (ref 125–245)

## 2015-06-04 LAB — MAGNESIUM: Magnesium: 2.3 mg/dl (ref 1.5–2.5)

## 2015-06-04 NOTE — Progress Notes (Signed)
[place] Telephone:([phone]) [phone]   Fax:(336) [phone]  OFFICE PROGRESS NOTE  [name],[name], MD 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 Roxan Hockey on 01/23/2006.  2) Tarceva 150 mg by mouth daily as part of the BMS checkmate 370 clinical trial. Status post 6 weeks of treatment.   CURRENT THERAPY: Tarceva 100 mg by mouth daily as part of the BMS checkmate 370 clinical trial. Started 07/17/2014 Status post 10 months of treatment.  DISEASE STAGE: Recurrent  CHEMOTHERAPY INTENT: Palliative.  CURRENT # OF CHEMOTHERAPY CYCLES: 11  CURRENT ANTIEMETICS: None  CURRENT SMOKING STATUS: Non-smoker  ORAL CHEMOTHERAPY AND CONSENT: None  CURRENT BISPHOSPHONATES USE: None  LIVING WILL AND CODE STATUS: Full code.  INTERVAL HISTORY: [name] 70 y.o. Coleman returns to the clinic today for followup visit accompanied by his wife. The patient is feeling fine today with no specific complaints and no significant change since his last visit. He denied having any skin rash or diarrhea. He denied having any significant chest pain, shortness of breath, cough or hemoptysis. The patient denied having any fever or chills, no nausea, vomiting, abdominal pain, diarrhea or constipation. He denied having any significant weight loss or night sweats. He has repeat CBC and comprehensive metabolic panel performed earlier today and he is here today for evaluation and discussion of his lab results.  MEDICAL HISTORY: Past Medical History  Diagnosis Date  . Hypertension   . Hypercholesterolemia   . Sinus disease     hx of  . History of pneumonia  last 3-4  yrs ago    3 -4 different times  . Hypothyroidism   . Sleep apnea     uses cpap setting opf 12  . History of agent Orange exposure 44-Andrew yrs ago  . Complication of anesthesia 2007    quit breathing with bronchoscopy, had to spend night  . lung ca dx'd 2007    lung right    ALLERGIES:  is allergic to lisinopril.  MEDICATIONS:  Current Outpatient Prescriptions  Medication Sig Dispense Refill  . amitriptyline (ELAVIL) 50 MG tablet Take 50 mg by mouth at bedtime.    [name] aspirin 81 MG tablet Take 81 mg by mouth every morning.     . cholecalciferol (VITAMIN D) 1000 UNITS tablet Take 2,000 Units by mouth daily.     . clindamycin (CLEOCIN-T) 1 % external solution Apply topically 2 (two) times daily. 240 mL 2  . clindamycin (CLINDAGEL) 1 % gel   1  . diltiazem (CARDIZEM CD) 360 MG 24 hr capsule Take 360 mg by mouth every morning.     . erlotinib (TARCEVA) 100 MG tablet Take 1 tablet (100 mg total) by mouth daily. Take on an empty stomach 1 hour before meals or 2 hours after.    . fluticasone (CUTIVATE) 0.05 % cream     . levETIRAcetam (KEPPRA) 750 MG tablet     . levothyroxine (SYNTHROID, LEVOTHROID) 88 MCG tablet Take 88 mcg by mouth daily before breakfast.    . metoCLOPramide (REGLAN) 10 MG tablet Take 10 mg by mouth every 6 (six) hours as needed for nausea.     . metoprolol succinate (  TOPROL-XL) 25 MG 24 hr tablet Take 25 mg by mouth every morning.     . Multiple Vitamins-Minerals (CENTRUM SILVER PO) Take 1 tablet by mouth daily.     . naproxen sodium (ANAPROX) 550 MG tablet Take 550 mg by mouth 3 (three) times daily as needed for moderate pain.     . nitroGLYCERIN (NITROSTAT) 0.4 MG SL tablet Place 0.4 mg under the tongue every 5 (five) minutes as needed for chest pain. Reported on 05/21/2015    . polyethylene glycol (MIRALAX / GLYCOLAX) packet Take 17 g by mouth daily.    . psyllium (METAMUCIL) 58.6 % powder Take 1 packet by mouth 3 (three) times daily.    . rosuvastatin (CRESTOR) 5 MG  tablet Take 5 mg by mouth every morning.     . valsartan-hydrochlorothiazide (DIOVAN-HCT) 320-25 MG per tablet Take 1 tablet by mouth every morning.      No current facility-administered medications for this visit.    SURGICAL HISTORY:  Past Surgical History  Procedure Laterality Date  . Cystourethroscopy, gyrus turp.  04/16/2010  . Torn medial and lateral menisci, left knee.  11/06/2006  . Right video-assisted thoracoscopy, right upper lobectomy and  01/23/2006  . The olympus video bronchoscope was introduced via the right  12/21/2005  . Nasal sinus surgery  1987  . Tonsillectomy  1957  . Colonoscopy with propofol N/A 03/19/2013    Procedure: COLONOSCOPY WITH PROPOFOL;  Surgeon: Garlan Fair, MD;  Location: [facility];  Service: Endoscopy;  Laterality: N/A;    REVIEW OF SYSTEMS:  A comprehensive review of systems was negative.   PHYSICAL EXAMINATION: General appearance: alert, cooperative and no distress Head: Normocephalic, without obvious abnormality, atraumatic Neck: no adenopathy Lymph nodes: Cervical, supraclavicular, and axillary nodes normal. Resp: clear to auscultation bilaterally Back: symmetric, no curvature. ROM normal. No CVA tenderness. Cardio: regular rate and rhythm, S1, S2 normal, no murmur, click, rub or gallop GI: soft, non-tender; bowel sounds normal; no masses,  no organomegaly Extremities: extremities normal, atraumatic, no cyanosis or edema Neurologic: Alert and oriented X 3, normal strength and tone. Normal symmetric reflexes. Normal coordination and gait    ECOG PERFORMANCE STATUS: 0 - Asymptomatic  Blood pressure 114/65, pulse 66, temperature 97.7 F (36.5 C), temperature source Oral, resp. rate 18, height 6' (1.829 m), weight 242 lb 4.8 oz (109.907 kg), SpO2 97 %.  LABORATORY DATA: Lab Results  Component Value Date   WBC 6.1 06/04/2015   HGB 13.9 06/04/2015   HCT 40.3 06/04/2015   MCV 98.8* 06/04/2015   PLT 204 06/04/2015      Chemistry       Component Value Date/Time   NA 138 06/04/2015 0848   NA 138 07/17/2014 1000   NA 139 10/20/2011 0903   K 3.8 06/04/2015 0848   K 3.3* 07/17/2014 1000   K 3.4 10/20/2011 0903   CL 107 07/17/2014 1000   CL 102 04/24/2012 0853   CL 101 10/20/2011 0903   CO2 26 06/04/2015 0848   CO2 26 07/17/2014 1000   CO2 29 10/20/2011 0903   BUN 13.9 06/04/2015 0848   BUN 16 07/17/2014 1000   BUN 12 10/20/2011 0903   CREATININE 1.1 06/04/2015 0848   CREATININE 0.82 07/17/2014 1000   CREATININE 0.9 10/20/2011 0903      Component Value Date/Time   CALCIUM 9.4 06/04/2015 0848   CALCIUM 8.9 07/17/2014 1000   CALCIUM 9.0 10/20/2011 0903   ALKPHOS 55 06/04/2015 0848   ALKPHOS 53  07/17/2014 1000   ALKPHOS 52 10/20/2011 0903   AST 27 06/04/2015 0848   AST 24 07/17/2014 1000   AST 24 10/20/2011 0903   ALT 27 06/04/2015 0848   ALT 28 07/17/2014 1000   ALT 31 10/20/2011 0903   BILITOT 0.50 06/04/2015 0848   BILITOT 0.8 07/17/2014 1000   BILITOT 0.60 10/20/2011 0903       RADIOGRAPHIC STUDIES: Ct Chest W Contrast  05/14/2015  CLINICAL DATA:  71 year old Coleman with history of lung cancer status. History of right upper lobe lesions status post right upper lobectomy in 2007, with recurrence. RECIST protocol EXAM: CT CHEST, ABDOMEN, AND PELVIS WITH CONTRAST TECHNIQUE: Multidetector CT imaging of the chest, abdomen and pelvis was performed following the standard protocol during bolus administration of intravenous contrast. CONTRAST:  149m OMNIPAQUE IOHEXOL 300 MG/ML  SOLN COMPARISON:  CT of the chest, abdomen and pelvis 03/19/2015. FINDINGS: RECIST 1.1 Target Lesions: 1. Right lower lobe nodule measures 17 mm (image 22 of series 5), unchanged. 2. Small linear lesion in the right lower lobe (image 24 of series 5) measures 11 mm (previously 16 mm) and is far less distinct than the prior study, likely a benign area of scarring. Non-target Lesions: 1. Previously described 6 mm sub solid nodule in the right  lower lobe is no longer clearly identified. 2. New 6 mm ground-glass attenuation nodule in the right lower lobe (image 28 of series 5). CT CHEST FINDINGS Mediastinum/Lymph Nodes: Heart size is normal. There is no significant pericardial fluid, thickening or pericardial calcification. No pathologically enlarged mediastinal or hilar lymph nodes. Esophagus is unremarkable in appearance. No axillary lymphadenopathy. Lungs/Pleura: Status post right upper lobectomy. Compensatory hyperexpansion of the right lower and right middle lobes. Previously demonstrated nodular area of architectural distortion in the right lower lobe is stable in size compared to the prior examination measuring 17 x 14 mm (image 22 of series 5). The other previously described linear opacity in the periphery of the right lower lobe is smaller than the prior study, measuring 11 mm on today's study, and far less distinct than the prior study (image 24 of series 5), favored to represent a benign area of linear scarring. Other previously noted ground-glass attenuation nodule in the right lower lobe is no longer evident. However, there is a new 6 mm ground-glass attenuation nodule in the right lower lobe (image 28 of series 5), which is nonspecific. No other larger more suspicious appearing pulmonary nodules or masses are identified at this time. No acute consolidative airspace disease. No pleural effusions. Musculoskeletal/Soft Tissues: There are no aggressive appearing lytic or blastic lesions noted in the visualized portions of the skeleton. CT ABDOMEN AND PELVIS FINDINGS Hepatobiliary: Mild diffuse decreased attenuation throughout the hepatic parenchyma, compatible with hepatic steatosis. No cystic or solid hepatic lesions. No intra or extrahepatic biliary ductal dilatation. Large gallstone in the neck of the gallbladder is partially calcified measuring 2.6 cm in diameter. No findings to suggest an acute cholecystitis at this time. Pancreas: No  pancreatic mass. No pancreatic ductal dilatation. No pancreatic or peripancreatic fluid or inflammatory changes. Spleen: Unremarkable. Adrenals/Urinary Tract: 16 mm exophytic low-attenuation lesion in the anterior aspect of the upper pole of the right kidney is compatible with a simple cyst. Left kidney is normal in appearance. Mild bilateral perinephric stranding (nonspecific), similar prior studies. No hydroureteronephrosis to indicate urinary tract obstruction at this time. Urinary bladder is normal in appearance. Bilateral adrenal glands are normal in appearance. Stomach/Bowel: Normal appearance of the stomach.  No pathologic dilatation of small bowel or colon. Normal appendix. Vascular/Lymphatic: Atherosclerosis throughout the abdominal and pelvic vasculature, without evidence of aneurysm or dissection. No lymphadenopathy noted in the abdomen or pelvis. Reproductive: Prostate gland and seminal vesicles are unremarkable in appearance. Other: New focus of haziness in the fat immediately anterior to the distal descending colon (image 90 of series 2), suspicious for a small focus of fat necrosis. This does not appear to be associated with diverticular disease, and is more displaced from the colon than is typically seen with epiploic appendagitis. This may simply represent a small omental infarct. No significant volume of ascites. No pneumoperitoneum. Musculoskeletal: Small lucent lesion with well-defined sclerotic margins and narrow zone of transition measuring 9 mm in the left femoral neck (image 134 of series 2), unchanged, favored to be benign. There are no aggressive appearing lytic or blastic lesions noted in the visualized portions of the skeleton. IMPRESSION: 1. Dominant nodule in the right lower lobe is unchanged compared to the prior examination, while the other 2 previously described non target lesions in the right lower lobe have gotten smaller and resolved. 2. New 6 mm ground-glass attenuation nodule in  the posterior aspect of the right lower lobe noted on today's examination is highly nonspecific. Attention on followup studies is recommended to ensure the stability or resolution of this finding. 3. No signs of metastatic disease in the abdomen or pelvis. 4. 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 5. Hepatic steatosis. 6. Cholelithiasis. 7. Atherosclerosis. 8. Additional incidental findings, as above. Electronically Signed   By: Vinnie Langton M.D.   On: 05/14/2015 09:01   Ct Abdomen Pelvis W Contrast  05/14/2015  CLINICAL DATA:  70 year old Coleman with history of lung cancer status. History of right upper lobe lesions status post right upper lobectomy in 2007, with recurrence. RECIST protocol EXAM: CT CHEST, ABDOMEN, AND PELVIS WITH CONTRAST TECHNIQUE: Multidetector CT imaging of the chest, abdomen and pelvis was performed following the standard protocol during bolus administration of intravenous contrast. CONTRAST:  134m OMNIPAQUE IOHEXOL 300 MG/ML  SOLN COMPARISON:  CT of the chest, abdomen and pelvis 03/19/2015. FINDINGS: RECIST 1.1 Target Lesions: 1. Right lower lobe nodule measures 17 mm (image 22 of series 5), unchanged. 2. Small linear lesion in the right lower lobe (image 24 of series 5) measures 11 mm (previously 16 mm) and is far less distinct than the prior study, likely a benign area of scarring. Non-target Lesions: 1. Previously described 6 mm sub solid nodule in the right lower lobe is no longer clearly identified. 2. New 6 mm ground-glass attenuation nodule in the right lower lobe (image 28 of series 5). CT CHEST FINDINGS Mediastinum/Lymph Nodes: Heart size is normal. There is no significant pericardial fluid, thickening or pericardial calcification. No pathologically enlarged mediastinal or hilar lymph nodes. Esophagus is unremarkable in appearance. No axillary lymphadenopathy.  Lungs/Pleura: Status post right upper lobectomy. Compensatory hyperexpansion of the right lower and right middle lobes. Previously demonstrated nodular area of architectural distortion in the right lower lobe is stable in size compared to the prior examination measuring 17 x 14 mm (image 22 of series 5). The other previously described linear opacity in the periphery of the right lower lobe is smaller than the prior study, measuring 11 mm on today's study, and far less distinct than the prior study (image 24 of series 5), favored to represent a benign area  of linear scarring. Other previously noted ground-glass attenuation nodule in the right lower lobe is no longer evident. However, there is a new 6 mm ground-glass attenuation nodule in the right lower lobe (image 28 of series 5), which is nonspecific. No other larger more suspicious appearing pulmonary nodules or masses are identified at this time. No acute consolidative airspace disease. No pleural effusions. Musculoskeletal/Soft Tissues: There are no aggressive appearing lytic or blastic lesions noted in the visualized portions of the skeleton. CT ABDOMEN AND PELVIS FINDINGS Hepatobiliary: Mild diffuse decreased attenuation throughout the hepatic parenchyma, compatible with hepatic steatosis. No cystic or solid hepatic lesions. No intra or extrahepatic biliary ductal dilatation. Large gallstone in the neck of the gallbladder is partially calcified measuring 2.6 cm in diameter. No findings to suggest an acute cholecystitis at this time. Pancreas: No pancreatic mass. No pancreatic ductal dilatation. No pancreatic or peripancreatic fluid or inflammatory changes. Spleen: Unremarkable. Adrenals/Urinary Tract: 16 mm exophytic low-attenuation lesion in the anterior aspect of the upper pole of the right kidney is compatible with a simple cyst. Left kidney is normal in appearance. Mild bilateral perinephric stranding (nonspecific), similar prior studies. No  hydroureteronephrosis to indicate urinary tract obstruction at this time. Urinary bladder is normal in appearance. Bilateral adrenal glands are normal in appearance. Stomach/Bowel: Normal appearance of the stomach. No pathologic dilatation of small bowel or colon. Normal appendix. Vascular/Lymphatic: Atherosclerosis throughout the abdominal and pelvic vasculature, without evidence of aneurysm or dissection. No lymphadenopathy noted in the abdomen or pelvis. Reproductive: Prostate gland and seminal vesicles are unremarkable in appearance. Other: New focus of haziness in the fat immediately anterior to the distal descending colon (image 90 of series 2), suspicious for a small focus of fat necrosis. This does not appear to be associated with diverticular disease, and is more displaced from the colon than is typically seen with epiploic appendagitis. This may simply represent a small omental infarct. No significant volume of ascites. No pneumoperitoneum. Musculoskeletal: Small lucent lesion with well-defined sclerotic margins and narrow zone of transition measuring 9 mm in the left femoral neck (image 134 of series 2), unchanged, favored to be benign. There are no aggressive appearing lytic or blastic lesions noted in the visualized portions of the skeleton. IMPRESSION: 1. Dominant nodule in the right lower lobe is unchanged compared to the prior examination, while the other 2 previously described non target lesions in the right lower lobe have gotten smaller and resolved. 2. New 6 mm ground-glass attenuation nodule in the posterior aspect of the right lower lobe noted on today's examination is highly nonspecific. Attention on followup studies is recommended to ensure the stability or resolution of this finding. 3. No signs of metastatic disease in the abdomen or pelvis. 4. 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  5. Hepatic steatosis. 6. Cholelithiasis. 7. Atherosclerosis. 8. Additional incidental findings, as above. Electronically Signed   By: Vinnie Langton M.D.   On: 05/14/2015 09:01   ASSESSMENT AND PLAN: This is a very pleasant 71 years old white Coleman with recurrent non-small cell lung cancer, adenocarcinoma with positive EGFR mutation in exon 21 that was initially diagnosed as stage IA non-small cell lung cancer status post right upper lobectomy and has been observation since October of 2007. He is status post 6 weeks treatment with Tarceva 150 mg by mouth daily according to the BMS checkmate 370 clinical trial. He is currently on  Tarceva 100 mg by mouth daily for more than 10 months and and tolerating it fairly well was no significant skin rash, diarrhea. I recommended for the patient to continue his current treatment with Tarceva at the same dose. He would come back for follow-up visit in 2 weeks for reevaluation according to the study protocol. The patient was advised to call immediately if he has any other concerning symptoms in the interval.  All questions were answered. The patient knows to call the clinic with any problems, questions or concerns. We can certainly see the patient much sooner if necessary.  Disclaimer: This note was dictated with voice recognition software. Similar sounding words can inadvertently be transcribed and may not be corrected upon review.

## 2015-06-04 NOTE — Progress Notes (Signed)
06/04/2015 0935 BMS 370 Cycle 27 The patient returns today accompanied by his wife.  He had labs done and resting VS with O2 Sat.  He reports having had a good last 2 weeks.  He has been walking a lot.  He reports no breathing changes.  He has dyspnea with exertion, unchanged, and per baseline.  His skin rash and fatigue remain the same.  His toenail is almost healed up.  He reports no other toxicities today. He reports that he has not missed any of his medications and that he has not started any new medications. Dr. [name] was in to see the patient.  A targeted exam was done.  Based on lab results review and targeted PE, Dr. [name] cleared the patient to continue treatment per protocol. The patient understands to call for any problems or concerns.

## 2015-06-04 NOTE — Progress Notes (Signed)
Pt has one more refill of tarceva at [facility]. His insurance approved his tarceva through 06/05/15. Note sent to [facility] to get prior auth for 06/06/15 x 1 year. Gerald Stabs, [facility] cc on note.

## 2015-06-05 ENCOUNTER — Telehealth: Payer: Self-pay | Admitting: Pharmacist

## 2015-06-05 NOTE — Telephone Encounter (Signed)
06/05/15: New Prior auth for [place] faxed into [facility]. Should be approved by early next week

## 2015-06-08 ENCOUNTER — Encounter: Payer: Self-pay | Admitting: Internal Medicine

## 2015-06-08 NOTE — Progress Notes (Signed)
Per [employer] tarceva approved 06/05/15-06/04/16. I sent to medical records

## 2015-06-17 ENCOUNTER — Encounter: Payer: Self-pay | Admitting: *Deleted

## 2015-06-17 ENCOUNTER — Encounter: Payer: Self-pay | Admitting: Internal Medicine

## 2015-06-17 DIAGNOSIS — C3431 Malignant neoplasm of lower lobe, right bronchus or lung: Secondary | ICD-10-CM

## 2015-06-17 MED ORDER — ERLOTINIB HCL 100 MG PO TABS: 100.0000 mg | ORAL_TABLET | Freq: Every day | ORAL | Status: DC

## 2015-06-18 ENCOUNTER — Telehealth: Payer: Self-pay | Admitting: Internal Medicine

## 2015-06-18 ENCOUNTER — Other Ambulatory Visit (HOSPITAL_COMMUNITY)
Admission: RE | Admit: 2015-06-18 | Discharge: 2015-06-18 | Disposition: A | Discharge status: Home / Self Care | Payer: Medicare Other | Source: Ambulatory Visit | Attending: Internal Medicine | Admitting: Internal Medicine

## 2015-06-18 ENCOUNTER — Other Ambulatory Visit (HOSPITAL_BASED_OUTPATIENT_CLINIC_OR_DEPARTMENT_OTHER): Payer: Medicare Other

## 2015-06-18 ENCOUNTER — Encounter: Payer: Self-pay | Admitting: Oncology

## 2015-06-18 ENCOUNTER — Ambulatory Visit (HOSPITAL_BASED_OUTPATIENT_CLINIC_OR_DEPARTMENT_OTHER): Payer: Medicare Other | Admitting: Internal Medicine

## 2015-06-18 VITALS — BP 140/80 | HR 81 | Temp 97.7°F | Resp 18 | Ht 72.0 in | Wt 244.5 lb

## 2015-06-18 DIAGNOSIS — C3431 Malignant neoplasm of lower lobe, right bronchus or lung: Secondary | ICD-10-CM

## 2015-06-18 DIAGNOSIS — Z5111 Encounter for antineoplastic chemotherapy: Secondary | ICD-10-CM

## 2015-06-18 DIAGNOSIS — Z79899 Other long term (current) drug therapy: Secondary | ICD-10-CM

## 2015-06-18 DIAGNOSIS — Z006 Encounter for examination for normal comparison and control in clinical research program: Secondary | ICD-10-CM

## 2015-06-18 DIAGNOSIS — C3490 Malignant neoplasm of unspecified part of unspecified bronchus or lung: Secondary | ICD-10-CM | POA: Diagnosis not present

## 2015-06-18 LAB — COMPREHENSIVE METABOLIC PANEL
ALT: 30 U/L (ref 0–55)
ANION GAP: 7 meq/L (ref 3–11)
AST: 27 U/L (ref 5–34)
Albumin: 3.9 g/dL (ref 3.5–5.0)
Alkaline Phosphatase: 65 U/L (ref 40–150)
BILIRUBIN TOTAL: 0.42 mg/dL (ref 0.20–1.20)
BUN: 17.6 mg/dL (ref 7.0–26.0)
CALCIUM: 9.4 mg/dL (ref 8.4–10.4)
CO2: 28 mEq/L (ref 22–29)
CREATININE: 1.1 mg/dL (ref 0.7–1.3)
Chloride: 106 mEq/L (ref 98–109)
EGFR: 66 mL/min/{1.73_m2} — ABNORMAL LOW (ref >90)
Glucose: 109 mg/dl (ref 70–140)
Potassium: 3.4 mEq/L — ABNORMAL LOW (ref 3.5–5.1)
Sodium: 141 mEq/L (ref 136–145)
TOTAL PROTEIN: 6.8 g/dL (ref 6.4–8.3)

## 2015-06-18 LAB — CBC WITH DIFFERENTIAL/PLATELET
BASO%: 0.2 % (ref 0.0–2.0)
Basophils Absolute: 0 10*3/uL (ref 0.0–0.1)
EOS%: 3.2 % (ref 0.0–7.0)
Eosinophils Absolute: 0.2 10*3/uL (ref 0.0–0.5)
HEMATOCRIT: 40.2 % (ref 38.4–49.9)
HGB: 13.9 g/dL (ref 13.0–17.1)
LYMPH#: 2 10*3/uL (ref 0.9–3.3)
LYMPH%: 35.3 % (ref 14.0–49.0)
MCH: 34 pg — ABNORMAL HIGH (ref 27.2–33.4)
MCHC: 34.6 g/dL (ref 32.0–36.0)
MCV: 98.3 fL — ABNORMAL HIGH (ref 79.3–98.0)
MONO#: 0.5 10*3/uL (ref 0.1–0.9)
MONO%: 8.1 % (ref 0.0–14.0)
NEUT%: 53.2 % (ref 39.0–75.0)
NEUTROS ABS: 3 10*3/uL (ref 1.5–6.5)
PLATELETS: 208 10*3/uL (ref 140–400)
RBC: 4.09 10*6/uL — ABNORMAL LOW (ref 4.20–5.82)
RDW: 12.8 % (ref 11.0–14.6)
WBC: 5.7 10*3/uL (ref 4.0–10.3)

## 2015-06-18 LAB — LACTATE DEHYDROGENASE: LDH: 193 U/L (ref 125–245)

## 2015-06-18 LAB — MAGNESIUM: MAGNESIUM: 2.4 mg/dL (ref 1.5–2.5)

## 2015-06-18 LAB — PHOSPHORUS: Phosphorus: 2.8 mg/dL (ref 2.5–4.6)

## 2015-06-18 LAB — TSH: TSH: 2.763 m(IU)/L (ref 0.320–4.118)

## 2015-06-18 NOTE — Progress Notes (Signed)
06/18/2015 1130 BMS 370 Group D, Arm A, week 52, cycle 27 The patient returns to clinic accompanied by his wife.  He completed the FACT-L and EQ-5D.  He had labs drawn.  He had resting vital signs and O2 sats done.  He reports not missing any of his study drug, erlotinib.  He reports no change in his current medications. He denies any new complaints.  His fatigue level remains the same, gr. 1.  He continues to walk with his wife.  He only has dyspnea on exertion.  His rash is unchanged, comes and goes.  The right first toe nail infection is almost completely healed up. Dr. [name] was in to perform a targeted physical exam.  Based on lab results review and PE, the patient was cleared to continue current treatment with erlotinib.  The patient's potassium level is sl. below normal limits.  Per MD, no action or treatment required.  The patient was encouraged to eat potassium rich foods per MD. The patient understands to call for any questions or concerns.  He is scheduled to have a CT scan on 07/09/15.

## 2015-06-18 NOTE — Progress Notes (Signed)
[place] Telephone:(336) [phone]   [phone]  OFFICE PROGRESS NOTE  [name],[name], MD 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 Roxan Hockey on 01/23/2006.  2) Tarceva 150 mg by mouth daily as part of the BMS checkmate 370 clinical trial. Status post 6 weeks of treatment.   CURRENT THERAPY: Tarceva 100 mg by mouth daily as part of the BMS checkmate 370 clinical trial. Started 07/17/2014 Status post 11 months of treatment.  DISEASE STAGE: Recurrent  CHEMOTHERAPY INTENT: Palliative.  CURRENT # OF CHEMOTHERAPY CYCLES: 12  CURRENT ANTIEMETICS: None  CURRENT SMOKING STATUS: Non-smoker  ORAL CHEMOTHERAPY AND CONSENT: None  CURRENT BISPHOSPHONATES USE: None  LIVING WILL AND CODE STATUS: Full code.  INTERVAL HISTORY: [name] 70 y.o. male returns to the clinic today for followup visit accompanied by his wife. The patient is feeling fine today with no specific complaints. He continues to tolerate his treatment with Tarceva well with no significant complaints. He denied having any skin rash or diarrhea. He denied having any significant chest pain, shortness of breath, cough or hemoptysis. The patient denied having any fever or chills, no nausea, vomiting, abdominal pain, diarrhea or constipation. He denied having any significant weight loss or night sweats. He has repeat CBC and comprehensive metabolic panel performed earlier today and he is here today for evaluation and discussion of his lab results.  MEDICAL HISTORY: Past Medical History  Diagnosis Date  . Hypertension   . Hypercholesterolemia   . Sinus disease     hx of  . History of pneumonia  last 3-4 yrs ago    3 -4 different times  . Hypothyroidism   . Sleep apnea     uses cpap setting opf 12  . History of agent Orange exposure 44-45 yrs ago  . Complication of anesthesia 2007    quit breathing with bronchoscopy, had to spend night  . lung ca dx'd 2007    lung right    ALLERGIES:  is allergic to lisinopril.  MEDICATIONS:  Current Outpatient Prescriptions  Medication Sig Dispense Refill  . aspirin 81 MG tablet Take 81 mg by mouth every morning.     . cholecalciferol (VITAMIN D) 1000 UNITS tablet Take 2,000 Units by mouth daily.     . clindamycin (CLEOCIN-T) 1 % external solution Apply topically 2 (two) times daily. 240 mL 2  . clindamycin (CLINDAGEL) 1 % gel   1  . diltiazem (CARDIZEM CD) 360 MG 24 hr capsule Take 360 mg by mouth every morning.     . erlotinib (TARCEVA) 100 MG tablet Take 1 tablet (100 mg total) by mouth daily. Take on an empty stomach 1 hour before meals or 2 hours after.    . fluticasone (CUTIVATE) 0.05 % cream     . levETIRAcetam (KEPPRA) 750 MG tablet     . levothyroxine (SYNTHROID, LEVOTHROID) 88 MCG tablet Take 88 mcg by mouth daily before breakfast.    . metoCLOPramide (REGLAN) 10 MG tablet Take 10 mg by mouth every 6 (six) hours as needed for nausea.     . metoprolol succinate (TOPROL-XL) 25 MG 24 hr tablet Take 25 mg by mouth every  morning.     . Multiple Vitamins-Minerals (CENTRUM SILVER PO) Take 1 tablet by mouth daily.     . polyethylene glycol (MIRALAX / GLYCOLAX) packet Take 17 g by mouth daily.    . psyllium (METAMUCIL) 58.6 % powder Take 1 packet by mouth 3 (three) times daily.    . rosuvastatin (CRESTOR) 5 MG tablet Take 5 mg by mouth every morning.     . valsartan-hydrochlorothiazide (DIOVAN-HCT) 320-25 MG per tablet Take 1 tablet by mouth every morning.     [name] amitriptyline (ELAVIL) 50 MG tablet Take 50 mg by mouth at bedtime. Reported on 06/18/2015    . naproxen sodium (ANAPROX) 550 MG tablet Take 550 mg  by mouth 3 (three) times daily as needed for moderate pain. Reported on 06/18/2015    . nitroGLYCERIN (NITROSTAT) 0.4 MG SL tablet Place 0.4 mg under the tongue every 5 (five) minutes as needed for chest pain. Reported on 06/18/2015     No current facility-administered medications for this visit.    SURGICAL HISTORY:  Past Surgical History  Procedure Laterality Date  . Cystourethroscopy, gyrus turp.  04/16/2010  . Torn medial and lateral menisci, left knee.  11/06/2006  . Right video-assisted thoracoscopy, right upper lobectomy and  01/23/2006  . The olympus video bronchoscope was introduced via the right  12/21/2005  . Nasal sinus surgery  1987  . Tonsillectomy  1957  . Colonoscopy with propofol N/A 03/19/2013    Procedure: COLONOSCOPY WITH PROPOFOL;  Surgeon: Garlan Fair, MD;  Location: [facility];  Service: Endoscopy;  Laterality: N/A;    REVIEW OF SYSTEMS:  A comprehensive review of systems was negative.   PHYSICAL EXAMINATION: General appearance: alert, cooperative and no distress Head: Normocephalic, without obvious abnormality, atraumatic Neck: no adenopathy Lymph nodes: Cervical, supraclavicular, and axillary nodes normal. Resp: clear to auscultation bilaterally Back: symmetric, no curvature. ROM normal. No CVA tenderness. Cardio: regular rate and rhythm, S1, S2 normal, no murmur, click, rub or gallop GI: soft, non-tender; bowel sounds normal; no masses,  no organomegaly Extremities: extremities normal, atraumatic, no cyanosis or edema Neurologic: Alert and oriented X 3, normal strength and tone. Normal symmetric reflexes. Normal coordination and gait    ECOG PERFORMANCE STATUS: 0 - Asymptomatic  Blood pressure 140/80, pulse 81, temperature 97.7 F (36.5 C), temperature source Oral, resp. rate 18, height 6' (1.829 m), weight 244 lb 8 oz (110.904 kg), SpO2 99 %.  LABORATORY DATA: Lab Results  Component Value Date   WBC 5.7 06/18/2015   HGB 13.9 06/18/2015   HCT  40.2 06/18/2015   MCV 98.3* 06/18/2015   PLT 208 06/18/2015      Chemistry      Component Value Date/Time   NA 141 06/18/2015 1055   NA 138 07/17/2014 1000   NA 139 10/20/2011 0903   K 3.4* 06/18/2015 1055   K 3.3* 07/17/2014 1000   K 3.4 10/20/2011 0903   CL 107 07/17/2014 1000   CL 102 04/24/2012 0853   CL 101 10/20/2011 0903   CO2 28 06/18/2015 1055   CO2 26 07/17/2014 1000   CO2 29 10/20/2011 0903   BUN 17.6 06/18/2015 1055   BUN 16 07/17/2014 1000   BUN 12 10/20/2011 0903   CREATININE 1.1 06/18/2015 1055   CREATININE 0.82 07/17/2014 1000   CREATININE 0.9 10/20/2011 0903      Component Value Date/Time   CALCIUM 9.4 06/18/2015 1055   CALCIUM 8.9 07/17/2014 1000   CALCIUM 9.0 10/20/2011 0903  ALKPHOS 65 06/18/2015 1055   ALKPHOS 53 07/17/2014 1000   ALKPHOS 52 10/20/2011 0903   AST 27 06/18/2015 1055   AST 24 07/17/2014 1000   AST 24 10/20/2011 0903   ALT 30 06/18/2015 1055   ALT 28 07/17/2014 1000   ALT 31 10/20/2011 0903   BILITOT 0.42 06/18/2015 1055   BILITOT 0.8 07/17/2014 1000   BILITOT 0.60 10/20/2011 0903       RADIOGRAPHIC STUDIES: No results found. ASSESSMENT AND PLAN: This is a very pleasant 70 years old white male with recurrent non-small cell lung cancer, adenocarcinoma with positive EGFR mutation in exon 21 that was initially diagnosed as stage IA non-small cell lung cancer status post right upper lobectomy and has been observation since October of 2007. He is status post 6 weeks treatment with Tarceva 150 mg by mouth daily according to the BMS checkmate 370 clinical trial. He is currently on Tarceva 100 mg by mouth daily for 11 months and and tolerating it fairly well with no significant skin rash, diarrhea. I recommended for the patient to continue his current treatment with Tarceva at the same dose. He would come back for follow-up visit in 2 weeks for reevaluation according to the study protocol. He will have repeat CT scan of the Chest,  Abdomen and Pelvis on 07/09/15. The patient was advised to call immediately if he has any other concerning symptoms in the interval.  All questions were answered. The patient knows to call the clinic with any problems, questions or concerns. We can certainly see the patient much sooner if necessary.  Disclaimer: This note was dictated with voice recognition software. Similar sounding words can inadvertently be transcribed and may not be corrected upon review.

## 2015-06-18 NOTE — Progress Notes (Signed)
06/18/15 - BMS [id number] Questionnaires (PRO's) - Patient into the cancer center for routine visit.  Patient given PRO's upon arrival to the cancer center.  Patient completed PRO's ([id number] first and then FACT-L) before any study procedures were done.  The PRO's were checked for completeness. The patient was thanked for his continued support in this clinical trial. [name] 06/18/15 - 11:05 am

## 2015-06-18 NOTE — Telephone Encounter (Signed)
per pof to sch pt-pt sch already made out

## 2015-06-18 NOTE — Telephone Encounter (Signed)
per pof to sch pt appt-appts already sch-pt to get contrast 3/30 appt for CT scan

## 2015-06-20 ENCOUNTER — Encounter: Payer: Self-pay | Admitting: Internal Medicine

## 2015-06-27 MED FILL — TARCEVA 100 MG TABLET: 100 | 30 days supply | Qty: 30 | Fill #2

## 2015-07-01 ENCOUNTER — Encounter: Payer: Self-pay | Admitting: *Deleted

## 2015-07-01 ENCOUNTER — Encounter: Payer: Self-pay | Admitting: Internal Medicine

## 2015-07-01 DIAGNOSIS — C3431 Malignant neoplasm of lower lobe, right bronchus or lung: Secondary | ICD-10-CM

## 2015-07-01 DIAGNOSIS — Z79899 Other long term (current) drug therapy: Secondary | ICD-10-CM

## 2015-07-01 MED ORDER — ERLOTINIB HCL 100 MG PO TABS: 100.0000 mg | ORAL_TABLET | Freq: Every day | ORAL | Status: DC

## 2015-07-02 ENCOUNTER — Other Ambulatory Visit (HOSPITAL_COMMUNITY)
Admission: AD | Admit: 2015-07-02 | Discharge: 2015-07-02 | Disposition: A | Discharge status: Home / Self Care | Payer: Medicare Other | Source: Ambulatory Visit | Attending: Internal Medicine | Admitting: Internal Medicine

## 2015-07-02 ENCOUNTER — Encounter: Payer: Self-pay | Admitting: Internal Medicine

## 2015-07-02 ENCOUNTER — Ambulatory Visit (HOSPITAL_BASED_OUTPATIENT_CLINIC_OR_DEPARTMENT_OTHER): Payer: Medicare Other | Admitting: Internal Medicine

## 2015-07-02 ENCOUNTER — Telehealth: Payer: Self-pay | Admitting: Internal Medicine

## 2015-07-02 ENCOUNTER — Other Ambulatory Visit (HOSPITAL_BASED_OUTPATIENT_CLINIC_OR_DEPARTMENT_OTHER): Payer: Medicare Other

## 2015-07-02 ENCOUNTER — Other Ambulatory Visit: Payer: Self-pay | Admitting: *Deleted

## 2015-07-02 VITALS — BP 102/59 | HR 64 | Temp 97.7°F | Resp 17 | Ht 72.0 in | Wt 243.5 lb

## 2015-07-02 DIAGNOSIS — Z006 Encounter for examination for normal comparison and control in clinical research program: Secondary | ICD-10-CM

## 2015-07-02 DIAGNOSIS — Z79899 Other long term (current) drug therapy: Secondary | ICD-10-CM

## 2015-07-02 DIAGNOSIS — Z5111 Encounter for antineoplastic chemotherapy: Secondary | ICD-10-CM

## 2015-07-02 DIAGNOSIS — C3431 Malignant neoplasm of lower lobe, right bronchus or lung: Secondary | ICD-10-CM

## 2015-07-02 DIAGNOSIS — C3491 Malignant neoplasm of unspecified part of right bronchus or lung: Secondary | ICD-10-CM

## 2015-07-02 LAB — COMPREHENSIVE METABOLIC PANEL
ALT: 25 U/L (ref 0–55)
ANION GAP: 6 meq/L (ref 3–11)
AST: 26 U/L (ref 5–34)
Albumin: 3.8 g/dL (ref 3.5–5.0)
Alkaline Phosphatase: 55 U/L (ref 40–150)
BUN: 16.3 mg/dL (ref 7.0–26.0)
CALCIUM: 9.4 mg/dL (ref 8.4–10.4)
CHLORIDE: 107 meq/L (ref 98–109)
CO2: 27 mEq/L (ref 22–29)
Creatinine: 1.1 mg/dL (ref 0.7–1.3)
EGFR: 70 mL/min/{1.73_m2} — ABNORMAL LOW (ref >90)
Glucose: 97 mg/dl (ref 70–140)
POTASSIUM: 3.8 meq/L (ref 3.5–5.1)
Sodium: 141 mEq/L (ref 136–145)
Total Bilirubin: 0.43 mg/dL (ref 0.20–1.20)
Total Protein: 6.5 g/dL (ref 6.4–8.3)

## 2015-07-02 LAB — CBC WITH DIFFERENTIAL/PLATELET
BASO%: 0.5 % (ref 0.0–2.0)
BASOS ABS: 0 10*3/uL (ref 0.0–0.1)
EOS%: 3.9 % (ref 0.0–7.0)
Eosinophils Absolute: 0.2 10*3/uL (ref 0.0–0.5)
HEMATOCRIT: 39.3 % (ref 38.4–49.9)
HGB: 13.3 g/dL (ref 13.0–17.1)
LYMPH#: 2.2 10*3/uL (ref 0.9–3.3)
LYMPH%: 36.5 % (ref 14.0–49.0)
MCH: 33.3 pg (ref 27.2–33.4)
MCHC: 33.7 g/dL (ref 32.0–36.0)
MCV: 98.6 fL — ABNORMAL HIGH (ref 79.3–98.0)
MONO#: 0.6 10*3/uL (ref 0.1–0.9)
MONO%: 10.3 % (ref 0.0–14.0)
NEUT#: 2.9 10*3/uL (ref 1.5–6.5)
NEUT%: 48.8 % (ref 39.0–75.0)
PLATELETS: 204 10*3/uL (ref 140–400)
RBC: 3.99 10*6/uL — AB (ref 4.20–5.82)
RDW: 13.1 % (ref 11.0–14.6)
WBC: 5.9 10*3/uL (ref 4.0–10.3)

## 2015-07-02 LAB — MAGNESIUM: Magnesium: 2.4 mg/dl (ref 1.5–2.5)

## 2015-07-02 LAB — LACTATE DEHYDROGENASE: LDH: 180 U/L (ref 125–245)

## 2015-07-02 LAB — PHOSPHORUS: PHOSPHORUS: 3 mg/dL (ref 2.5–4.6)

## 2015-07-02 NOTE — Telephone Encounter (Signed)
Gave and printed appt sched and avs for pt for April and May....gv barium

## 2015-07-02 NOTE — Progress Notes (Signed)
[place] Telephone:([phone]   Fax:(336) [phone]  OFFICE PROGRESS NOTE  [name],[name], MD 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 Roxan Hockey on 01/23/2006.  2) Tarceva 150 mg by mouth daily as part of the BMS checkmate 370 clinical trial. Status post 6 weeks of treatment.   CURRENT THERAPY: Tarceva 100 mg by mouth daily as part of the BMS checkmate 370 clinical trial. Started 07/17/2014 Status post 11 months of treatment.  DISEASE STAGE: Recurrent  CHEMOTHERAPY INTENT: Palliative.  CURRENT # OF CHEMOTHERAPY CYCLES: 12  CURRENT ANTIEMETICS: None  CURRENT SMOKING STATUS: Non-smoker  ORAL CHEMOTHERAPY AND CONSENT: None  CURRENT BISPHOSPHONATES USE: None  LIVING WILL AND CODE STATUS: Full code.  INTERVAL HISTORY: [name] 70 y.o. male returns to the clinic today for followup visit accompanied by his wife. The patient is feeling fine today and continues to tolerate his treatment with Tarceva well with no significant complaints. He denied having any skin rash or diarrhea. He denied having any significant chest pain, shortness of breath, cough or hemoptysis. The patient denied having any fever or chills, no nausea, vomiting, abdominal pain, diarrhea or constipation. He denied having any significant weight loss or night sweats. He has repeat CBC and comprehensive metabolic panel performed earlier today and he is here today for evaluation and discussion of his lab results.  MEDICAL HISTORY: Past Medical History  Diagnosis Date  . Hypertension   . Hypercholesterolemia   . Sinus disease     hx of  . History of  pneumonia  last 3-4 yrs ago    3 -4 different times  . Hypothyroidism   . Sleep apnea     uses cpap setting opf 12  . History of agent Orange exposure 44-45 yrs ago  . Complication of anesthesia 2007    quit breathing with bronchoscopy, had to spend night  . lung ca dx'd 2007    lung right    ALLERGIES:  is allergic to lisinopril.  MEDICATIONS:  Current Outpatient Prescriptions  Medication Sig Dispense Refill  . amitriptyline (ELAVIL) 50 MG tablet Take 50 mg by mouth at bedtime. Reported on 06/18/2015    . aspirin 81 MG tablet Take 81 mg by mouth every morning.     . cholecalciferol (VITAMIN D) 1000 UNITS tablet Take 2,000 Units by mouth daily.     . clindamycin (CLEOCIN-T) 1 % external solution Apply topically 2 (two) times daily. 240 mL 2  . clindamycin (CLINDAGEL) 1 % gel   1  . diltiazem (CARDIZEM CD) 360 MG 24 hr capsule Take 360 mg by mouth every morning.     . erlotinib (TARCEVA) 100 MG tablet Take 1 tablet (100 mg total) by mouth daily. Take on an empty stomach 1 hour before meals or 2 hours after.    . erlotinib (TARCEVA) 100 MG tablet Take 1 tablet (100 mg total) by mouth daily. Take on an empty stomach 1 hour before meals or 2 hours after.    . fluticasone (CUTIVATE) 0.05 % cream     . levETIRAcetam (KEPPRA) 750 MG tablet     . levothyroxine (SYNTHROID, LEVOTHROID) 88 MCG tablet Take 88  mcg by mouth daily before breakfast.    . metoCLOPramide (REGLAN) 10 MG tablet Take 10 mg by mouth every 6 (six) hours as needed for nausea.     . metoprolol succinate (TOPROL-XL) 25 MG 24 hr tablet Take 25 mg by mouth every morning.     . Multiple Vitamins-Minerals (CENTRUM SILVER PO) Take 1 tablet by mouth daily.     . naproxen sodium (ANAPROX) 550 MG tablet Take 550 mg by mouth 3 (three) times daily as needed for moderate pain. Reported on 06/18/2015    . nitroGLYCERIN (NITROSTAT) 0.4 MG SL tablet Place 0.4 mg under the tongue every 5 (five) minutes as needed for chest pain. Reported on  06/18/2015    . polyethylene glycol (MIRALAX / GLYCOLAX) packet Take 17 g by mouth daily.    . psyllium (METAMUCIL) 58.6 % powder Take 1 packet by mouth 3 (three) times daily.    . rosuvastatin (CRESTOR) 5 MG tablet Take 5 mg by mouth every morning.     . valsartan-hydrochlorothiazide (DIOVAN-HCT) 320-25 MG per tablet Take 1 tablet by mouth every morning.      No current facility-administered medications for this visit.    SURGICAL HISTORY:  Past Surgical History  Procedure Laterality Date  . Cystourethroscopy, gyrus turp.  04/16/2010  . Torn medial and lateral menisci, left knee.  11/06/2006  . Right video-assisted thoracoscopy, right upper lobectomy and  01/23/2006  . The olympus video bronchoscope was introduced via the right  12/21/2005  . Nasal sinus surgery  1987  . Tonsillectomy  1957  . Colonoscopy with propofol N/A 03/19/2013    Procedure: COLONOSCOPY WITH PROPOFOL;  Surgeon: Garlan Fair, MD;  Location: [facility];  Service: Endoscopy;  Laterality: N/A;    REVIEW OF SYSTEMS:  A comprehensive review of systems was negative.   PHYSICAL EXAMINATION: General appearance: alert, cooperative and no distress Head: Normocephalic, without obvious abnormality, atraumatic Neck: no adenopathy Lymph nodes: Cervical, supraclavicular, and axillary nodes normal. Resp: clear to auscultation bilaterally Back: symmetric, no curvature. ROM normal. No CVA tenderness. Cardio: regular rate and rhythm, S1, S2 normal, no murmur, click, rub or gallop GI: soft, non-tender; bowel sounds normal; no masses,  no organomegaly Extremities: extremities normal, atraumatic, no cyanosis or edema Neurologic: Alert and oriented X 3, normal strength and tone. Normal symmetric reflexes. Normal coordination and gait    ECOG PERFORMANCE STATUS: 0 - Asymptomatic  Blood pressure 102/59, pulse 64, temperature 97.7 F (36.5 C), temperature source Oral, resp. rate 17, height 6' (1.829 m), weight 243 lb 8 oz  (110.451 kg), SpO2 98 %.  LABORATORY DATA: Lab Results  Component Value Date   WBC 5.9 07/02/2015   HGB 13.3 07/02/2015   HCT 39.3 07/02/2015   MCV 98.6* 07/02/2015   PLT 204 07/02/2015      Chemistry      Component Value Date/Time   NA 141 07/02/2015 0823   NA 138 07/17/2014 1000   NA 139 10/20/2011 0903   K 3.8 07/02/2015 0823   K 3.3* 07/17/2014 1000   K 3.4 10/20/2011 0903   CL 107 07/17/2014 1000   CL 102 04/24/2012 0853   CL 101 10/20/2011 0903   CO2 27 07/02/2015 0823   CO2 26 07/17/2014 1000   CO2 29 10/20/2011 0903   BUN 16.3 07/02/2015 0823   BUN 16 07/17/2014 1000   BUN 12 10/20/2011 0903   CREATININE 1.1 07/02/2015 0823   CREATININE 0.82 07/17/2014 1000   CREATININE 0.9 10/20/2011  4417      Component Value Date/Time   CALCIUM 9.4 07/02/2015 0823   CALCIUM 8.9 07/17/2014 1000   CALCIUM 9.0 10/20/2011 0903   ALKPHOS 55 07/02/2015 0823   ALKPHOS 53 07/17/2014 1000   ALKPHOS 52 10/20/2011 0903   AST 26 07/02/2015 0823   AST 24 07/17/2014 1000   AST 24 10/20/2011 0903   ALT 25 07/02/2015 0823   ALT 28 07/17/2014 1000   ALT 31 10/20/2011 0903   BILITOT 0.43 07/02/2015 0823   BILITOT 0.8 07/17/2014 1000   BILITOT 0.60 10/20/2011 0903       RADIOGRAPHIC STUDIES: No results found. ASSESSMENT AND PLAN: This is a very pleasant 70 years old white male with recurrent non-small cell lung cancer, adenocarcinoma with positive EGFR mutation in exon 21 that was initially diagnosed as stage IA non-small cell lung cancer status post right upper lobectomy and has been observation since October of 2007. He is status post 6 weeks treatment with Tarceva 150 mg by mouth daily according to the BMS checkmate 370 clinical trial. He is currently on Tarceva 100 mg by mouth daily for more than 11 months and and tolerating it fairly well with no significant skin rash, diarrhea. I recommended for the patient to continue his current treatment with Tarceva at the same dose. He  would come back for follow-up visit in 2 weeks for reevaluation with repeat CT scan of the Chest, Abdomen and Pelvis on 07/09/15. The patient was advised to call immediately if he has any other concerning symptoms in the interval.  All questions were answered. The patient knows to call the clinic with any problems, questions or concerns. We can certainly see the patient much sooner if necessary.  Disclaimer: This note was dictated with voice recognition software. Similar sounding words can inadvertently be transcribed and may not be corrected upon review.

## 2015-07-02 NOTE — Progress Notes (Signed)
07/02/2015 0915 BMS 370 Group D, Arm A  Cycle 29, week 56 The patient returns to clinic accompanied by his wife.  Labs were drawn and resting VS were obtained, including O2 sat.  Weight remains stable.  He reports taking erlotinib as prescribed and states that he has not missed any doses.  He reports no new medication changes today. The patient denies any complaints, including change in breathing, diarrhea, or other concerns.  He only gets dyspnea with exertion.  His fatigue is mild.  He has been walking every day in his neighborhood.  He reports his skin rash is mild, still present and unchanged.  He denies numbness, tingling, swelling, eye problems and GI complaints. Dr. [name] was in to see the patient and perform a targeted physical exam.  Based on lab results review and PE, the patient was cleared by MD to continue treatment.  He will have re-staging CT scans next week. The patient understands to call for any problems and verbalized understanding of instructions.

## 2015-07-09 ENCOUNTER — Ambulatory Visit (HOSPITAL_COMMUNITY)
Admission: RE | Admit: 2015-07-09 | Discharge: 2015-07-09 | Disposition: A | Discharge status: Home / Self Care | Payer: Medicare Other | Source: Ambulatory Visit | Attending: Internal Medicine | Admitting: Internal Medicine

## 2015-07-09 ENCOUNTER — Encounter (HOSPITAL_COMMUNITY): Payer: Self-pay

## 2015-07-09 DIAGNOSIS — Z902 Acquired absence of lung [part of]: Secondary | ICD-10-CM | POA: Insufficient documentation

## 2015-07-09 DIAGNOSIS — C3431 Malignant neoplasm of lower lobe, right bronchus or lung: Secondary | ICD-10-CM | POA: Insufficient documentation

## 2015-07-09 DIAGNOSIS — K760 Fatty (change of) liver, not elsewhere classified: Secondary | ICD-10-CM | POA: Diagnosis not present

## 2015-07-09 DIAGNOSIS — R918 Other nonspecific abnormal finding of lung field: Secondary | ICD-10-CM | POA: Insufficient documentation

## 2015-07-09 DIAGNOSIS — M47816 Spondylosis without myelopathy or radiculopathy, lumbar region: Secondary | ICD-10-CM | POA: Diagnosis not present

## 2015-07-09 DIAGNOSIS — M5136 Other intervertebral disc degeneration, lumbar region: Secondary | ICD-10-CM | POA: Diagnosis not present

## 2015-07-09 DIAGNOSIS — Z9221 Personal history of antineoplastic chemotherapy: Secondary | ICD-10-CM | POA: Insufficient documentation

## 2015-07-09 DIAGNOSIS — K8020 Calculus of gallbladder without cholecystitis without obstruction: Secondary | ICD-10-CM | POA: Insufficient documentation

## 2015-07-09 DIAGNOSIS — S29019A Strain of muscle and tendon of unspecified wall of thorax, initial encounter: Secondary | ICD-10-CM | POA: Diagnosis not present

## 2015-07-09 DIAGNOSIS — Z5111 Encounter for antineoplastic chemotherapy: Secondary | ICD-10-CM

## 2015-07-09 MED ORDER — IOPAMIDOL (ISOVUE-300) INJECTION 61%
100.0000 mL | Freq: Once | INTRAVENOUS | Status: AC | PRN
  Administered 2015-07-09: 100 mL via INTRAVENOUS

## 2015-07-15 ENCOUNTER — Encounter: Payer: Self-pay | Admitting: *Deleted

## 2015-07-15 ENCOUNTER — Encounter: Payer: Self-pay | Admitting: Internal Medicine

## 2015-07-15 DIAGNOSIS — C3431 Malignant neoplasm of lower lobe, right bronchus or lung: Secondary | ICD-10-CM

## 2015-07-15 MED ORDER — ERLOTINIB HCL 100 MG PO TABS: 100.0000 mg | ORAL_TABLET | Freq: Every day | ORAL | Status: DC

## 2015-07-16 ENCOUNTER — Encounter: Payer: Self-pay | Admitting: Internal Medicine

## 2015-07-16 ENCOUNTER — Ambulatory Visit (HOSPITAL_BASED_OUTPATIENT_CLINIC_OR_DEPARTMENT_OTHER): Payer: Medicare Other | Admitting: Internal Medicine

## 2015-07-16 ENCOUNTER — Other Ambulatory Visit (HOSPITAL_COMMUNITY)
Admission: RE | Admit: 2015-07-16 | Discharge: 2015-07-16 | Disposition: A | Discharge status: Home / Self Care | Payer: Medicare Other | Source: Ambulatory Visit | Attending: Internal Medicine | Admitting: Internal Medicine

## 2015-07-16 ENCOUNTER — Other Ambulatory Visit (HOSPITAL_BASED_OUTPATIENT_CLINIC_OR_DEPARTMENT_OTHER): Payer: Medicare Other

## 2015-07-16 VITALS — BP 105/68 | HR 56 | Temp 97.7°F | Resp 18 | Ht 72.0 in | Wt 240.0 lb

## 2015-07-16 DIAGNOSIS — K8070 Calculus of gallbladder and bile duct without cholecystitis without obstruction: Secondary | ICD-10-CM

## 2015-07-16 DIAGNOSIS — C3431 Malignant neoplasm of lower lobe, right bronchus or lung: Secondary | ICD-10-CM | POA: Insufficient documentation

## 2015-07-16 DIAGNOSIS — Z006 Encounter for examination for normal comparison and control in clinical research program: Secondary | ICD-10-CM

## 2015-07-16 DIAGNOSIS — K8020 Calculus of gallbladder without cholecystitis without obstruction: Secondary | ICD-10-CM

## 2015-07-16 DIAGNOSIS — Z5111 Encounter for antineoplastic chemotherapy: Secondary | ICD-10-CM

## 2015-07-16 HISTORY — DX: Calculus of gallbladder without cholecystitis without obstruction: K80.20

## 2015-07-16 LAB — COMPREHENSIVE METABOLIC PANEL
ALT: 32 U/L (ref 0–55)
ANION GAP: 6 meq/L (ref 3–11)
AST: 30 U/L (ref 5–34)
Albumin: 3.9 g/dL (ref 3.5–5.0)
Alkaline Phosphatase: 60 U/L (ref 40–150)
BUN: 12.9 mg/dL (ref 7.0–26.0)
CHLORIDE: 104 meq/L (ref 98–109)
CO2: 29 meq/L (ref 22–29)
Calcium: 9.4 mg/dL (ref 8.4–10.4)
Creatinine: 1.1 mg/dL (ref 0.7–1.3)
EGFR: 66 mL/min/{1.73_m2} — AB (ref >90)
GLUCOSE: 97 mg/dL (ref 70–140)
Potassium: 3.7 mEq/L (ref 3.5–5.1)
Sodium: 140 mEq/L (ref 136–145)
TOTAL PROTEIN: 6.7 g/dL (ref 6.4–8.3)
Total Bilirubin: 0.43 mg/dL (ref 0.20–1.20)

## 2015-07-16 LAB — CBC WITH DIFFERENTIAL/PLATELET
BASO%: 0.5 % (ref 0.0–2.0)
Basophils Absolute: 0 10*3/uL (ref 0.0–0.1)
EOS ABS: 0.2 10*3/uL (ref 0.0–0.5)
EOS%: 3.5 % (ref 0.0–7.0)
HEMATOCRIT: 38.6 % (ref 38.4–49.9)
HEMOGLOBIN: 13.1 g/dL (ref 13.0–17.1)
LYMPH#: 2.3 10*3/uL (ref 0.9–3.3)
LYMPH%: 37.7 % (ref 14.0–49.0)
MCH: 33.3 pg (ref 27.2–33.4)
MCHC: 33.9 g/dL (ref 32.0–36.0)
MCV: 98.2 fL — AB (ref 79.3–98.0)
MONO#: 0.7 10*3/uL (ref 0.1–0.9)
MONO%: 11.1 % (ref 0.0–14.0)
NEUT%: 47.2 % (ref 39.0–75.0)
NEUTROS ABS: 2.9 10*3/uL (ref 1.5–6.5)
PLATELETS: 238 10*3/uL (ref 140–400)
RBC: 3.93 10*6/uL — ABNORMAL LOW (ref 4.20–5.82)
RDW: 12.8 % (ref 11.0–14.6)
WBC: 6.1 10*3/uL (ref 4.0–10.3)

## 2015-07-16 LAB — PHOSPHORUS: PHOSPHORUS: 3.3 mg/dL (ref 2.5–4.6)

## 2015-07-16 LAB — MAGNESIUM: MAGNESIUM: 2.4 mg/dL (ref 1.5–2.5)

## 2015-07-16 LAB — LACTATE DEHYDROGENASE: LDH: 185 U/L (ref 125–245)

## 2015-07-16 NOTE — Progress Notes (Signed)
07/16/2015  1143 Correction to BMS 370 cycle 30, week 58, not cycle 29, week 56. Marcellus Scott, RN, BSN, MHA, OCN

## 2015-07-16 NOTE — Progress Notes (Signed)
[place] [phone]   [phone]  OFFICE PROGRESS NOTE  [name],[name], MD 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 Roxan Hockey on 01/23/2006.  2) Tarceva 150 mg by mouth daily as part of the BMS checkmate 370 clinical trial. Status post 6 weeks of treatment.   CURRENT THERAPY: Tarceva 100 mg by mouth daily as part of the BMS checkmate 370 clinical trial. Started 07/17/2014 Status post 12 months of treatment.  DISEASE STAGE: Recurrent  CHEMOTHERAPY INTENT: Palliative.  CURRENT # OF CHEMOTHERAPY CYCLES: 13  CURRENT ANTIEMETICS: None  CURRENT SMOKING STATUS: Non-smoker  ORAL CHEMOTHERAPY AND CONSENT: None  CURRENT BISPHOSPHONATES USE: None  LIVING WILL AND CODE STATUS: Full code.  INTERVAL HISTORY: [name] 70 y.o. male returns to the clinic today for followup visit accompanied by his wife. The patient is feeling fine today and continues to tolerate his treatment with Tarceva well with no significant complaints. He and his wife got sick few days ago. He recovered well except for persistent nausea early in the morning. He has Compazine at home but he does not see the need to take it at regular basis. He denied having any skin rash or diarrhea. He denied having any significant chest pain, shortness of breath, cough or hemoptysis. The patient denied having any fever or chills, no nausea, vomiting, abdominal pain, diarrhea or constipation. He denied having any significant weight loss or night sweats. He has repeat CT scan of the chest, abdomen and pelvis performed recently and he is here for evaluation and  discussion of his scan results.  MEDICAL HISTORY: Past Medical History  Diagnosis Date  . Hypertension   . Hypercholesterolemia   . Sinus disease     hx of  . History of pneumonia  last 3-4 yrs ago    3 -4 different times  . Hypothyroidism   . Sleep apnea     uses cpap setting opf 12  . History of agent Orange exposure 44-45 yrs ago  . Complication of anesthesia 2007    quit breathing with bronchoscopy, had to spend night  . lung ca dx'd 2007    lung right    ALLERGIES:  is allergic to lisinopril.  MEDICATIONS:  Current Outpatient Prescriptions  Medication Sig Dispense Refill  . amitriptyline (ELAVIL) 50 MG tablet Take 50 mg by mouth at bedtime. Reported on 06/18/2015    . aspirin 81 MG tablet Take 81 mg by mouth every morning.     . cholecalciferol (VITAMIN D) 1000 UNITS tablet Take 2,000 Units by mouth daily.     . clindamycin (CLEOCIN-T) 1 % external solution Apply topically 2 (two) times daily. 240 mL 2  . clindamycin (CLINDAGEL) 1 % gel   1  . diltiazem (CARDIZEM CD) 360 MG 24 hr capsule Take 360 mg by mouth every morning.     . erlotinib (TARCEVA) 100 MG tablet Take 1 tablet (100 mg total) by mouth daily. Take on an empty stomach 1 hour before meals or 2 hours after.    . levETIRAcetam (KEPPRA) 750 MG tablet     . levothyroxine (SYNTHROID, LEVOTHROID) 88 MCG tablet Take 88 mcg by  mouth daily before breakfast.    . metoCLOPramide (REGLAN) 10 MG tablet Take 10 mg by mouth every 6 (six) hours as needed for nausea.     . metoprolol succinate (TOPROL-XL) 25 MG 24 hr tablet Take 25 mg by mouth every morning.     . Multiple Vitamins-Minerals (CENTRUM SILVER PO) Take 1 tablet by mouth daily.     . naproxen sodium (ANAPROX) 550 MG tablet Take 550 mg by mouth 3 (three) times daily as needed for moderate pain. Reported on 06/18/2015    . polyethylene glycol (MIRALAX / GLYCOLAX) packet Take 17 g by mouth daily.    . psyllium (METAMUCIL) 58.6 % powder Take 1 packet by mouth 3 (three)  times daily.    . rosuvastatin (CRESTOR) 5 MG tablet Take 5 mg by mouth every morning.     . valsartan-hydrochlorothiazide (DIOVAN-HCT) 320-25 MG per tablet Take 1 tablet by mouth every morning.     . nitroGLYCERIN (NITROSTAT) 0.4 MG SL tablet Place 0.4 mg under the tongue every 5 (five) minutes as needed for chest pain. Reported on 07/16/2015     No current facility-administered medications for this visit.    SURGICAL HISTORY:  Past Surgical History  Procedure Laterality Date  . Cystourethroscopy, gyrus turp.  04/16/2010  . Torn medial and lateral menisci, left knee.  11/06/2006  . Right video-assisted thoracoscopy, right upper lobectomy and  01/23/2006  . The olympus video bronchoscope was introduced via the right  12/21/2005  . Nasal sinus surgery  1987  . Tonsillectomy  1957  . Colonoscopy with propofol N/A 03/19/2013    Procedure: COLONOSCOPY WITH PROPOFOL;  Surgeon: Garlan Fair, MD;  Location: [facility];  Service: Endoscopy;  Laterality: N/A;    REVIEW OF SYSTEMS:  Constitutional: negative Eyes: negative Ears, nose, mouth, throat, and face: negative Respiratory: negative Cardiovascular: negative Gastrointestinal: positive for nausea Genitourinary:negative Integument/breast: negative Hematologic/lymphatic: negative Musculoskeletal:negative Neurological: negative Behavioral/Psych: negative Endocrine: negative Allergic/Immunologic: negative   PHYSICAL EXAMINATION: General appearance: alert, cooperative and no distress Head: Normocephalic, without obvious abnormality, atraumatic Neck: no adenopathy Lymph nodes: Cervical, supraclavicular, and axillary nodes normal. Resp: clear to auscultation bilaterally Back: symmetric, no curvature. ROM normal. No CVA tenderness. Cardio: regular rate and rhythm, S1, S2 normal, no murmur, click, rub or gallop GI: soft, non-tender; bowel sounds normal; no masses,  no organomegaly Extremities: extremities normal, atraumatic, no  cyanosis or edema Neurologic: Alert and oriented X 3, normal strength and tone. Normal symmetric reflexes. Normal coordination and gait    ECOG PERFORMANCE STATUS: 0 - Asymptomatic  Blood pressure 105/68, pulse 56, temperature 97.7 F (36.5 C), temperature source Oral, resp. rate 18, height 6' (1.829 m), weight 240 lb (108.863 kg), SpO2 99 %.  LABORATORY DATA: Lab Results  Component Value Date   WBC 6.1 07/16/2015   HGB 13.1 07/16/2015   HCT 38.6 07/16/2015   MCV 98.2* 07/16/2015   PLT 238 07/16/2015      Chemistry      Component Value Date/Time   NA 141 07/02/2015 0823   NA 138 07/17/2014 1000   NA 139 10/20/2011 0903   K 3.8 07/02/2015 0823   K 3.3* 07/17/2014 1000   K 3.4 10/20/2011 0903   CL 107 07/17/2014 1000   CL 102 04/24/2012 0853   CL 101 10/20/2011 0903   CO2 27 07/02/2015 0823   CO2 26 07/17/2014 1000   CO2 29 10/20/2011 0903   BUN 16.3 07/02/2015 0823   BUN 16 07/17/2014 1000  BUN 12 10/20/2011 0903   CREATININE 1.1 07/02/2015 0823   CREATININE 0.82 07/17/2014 1000   CREATININE 0.9 10/20/2011 0903      Component Value Date/Time   CALCIUM 9.4 07/02/2015 0823   CALCIUM 8.9 07/17/2014 1000   CALCIUM 9.0 10/20/2011 0903   ALKPHOS 55 07/02/2015 0823   ALKPHOS 53 07/17/2014 1000   ALKPHOS 52 10/20/2011 0903   AST 26 07/02/2015 0823   AST 24 07/17/2014 1000   AST 24 10/20/2011 0903   ALT 25 07/02/2015 0823   ALT 28 07/17/2014 1000   ALT 31 10/20/2011 0903   BILITOT 0.43 07/02/2015 0823   BILITOT 0.8 07/17/2014 1000   BILITOT 0.60 10/20/2011 0903       RADIOGRAPHIC STUDIES: Ct Chest W Contrast  07/09/2015  CLINICAL DATA:  Right lower lobe lung cancer. Chemotherapy. Restaging. EXAM: CT CHEST, ABDOMEN, AND PELVIS WITH CONTRAST RECIST 1.1 REPORTING TECHNIQUE: Multidetector CT imaging of the chest, abdomen and pelvis was performed following the standard protocol during bolus administration of intravenous contrast. CONTRAST:  1110m ISOVUE-300 IOPAMIDOL  (ISOVUE-300) INJECTION 61% COMPARISON:  05/14/2015 FINDINGS: RECIST 1.1 Target Lesions: 1. Right lower lobe nodule on image 41/5: 1.7 cm in long axis, unchanged 2. Linear right lower lobe density on image 44/5: 1.1 cm in long axis, unchanged. Non-target Lesions: 1. Sub solid nodule anteriorly in the right lower lobe on image 45/6: 6 mm, unchanged from 03/19/2015. 2. Right lower lobe nodule on image 51/5: 0.8 cm in long axis, previously 0.7 cm. CT CHEST FINDINGS Mediastinum/Nodes: Postoperative findings along the right mediastinal margin as before. No pathologic thoracic adenopathy. Lungs/Pleura: Right upper lobectomy. Right lower lobe bandlike scarring with largest triangular portion on axial images measuring 1.4 by 1.7 cm on image 41/5, not changed from prior and not subjectively different. Posterior nodule in the right lower lobe measures 0.8 by 0.5 cm on image 51/5, by my measurement previously 0.7 by 0.5 cm. No new nodule identified. Musculoskeletal: Mildly elevated right hemidiaphragm, stable. CT ABDOMEN PELVIS FINDINGS Hepatobiliary: Calcified gallstone in the gallbladder, 2.9 cm in long axis on image 56/2. Mild hepatic steatosis. Pancreas: Unremarkable Spleen: Unremarkable Adrenals/Urinary Tract: Adrenal glands normal. Mild stable chronic bilateral perirenal stranding. Otherwise unremarkable. Stomach/Bowel: Unremarkable.  Appendix normal. Vascular/Lymphatic: Aortoiliac atherosclerotic vascular disease. No pathologic abdominal or pelvic adenopathy identified. Reproductive: Unremarkable Other: No supplemental non-categorized findings. Musculoskeletal: Lumbar spondylosis and degenerative disc disease with suspected foraminal impingement at the L5-S1 level and potentially on the left at L3-4. Fatty left spermatic cord. Small rim sclerotic lesion in the left femoral neck laterally, chronically stable, considered benign. IMPRESSION: 1. Similar appearance of bandlike densities and small nodules in the right lung,  likely meriting surveillance. Prior right upper lobectomy. 2. Other imaging findings of potential clinical significance: Elevated right hemidiaphragm. Cholelithiasis. Hepatic steatosis. Lumbar spondylosis and degenerative disc disease with suspected impingement at L3-4 and L5-S1. Aortoiliac atherosclerotic vascular disease. Electronically Signed   By: WVan ClinesM.D.   On: 07/09/2015 10:28   Ct Abdomen Pelvis W Contrast  07/09/2015  CLINICAL DATA:  Right lower lobe lung cancer. Chemotherapy. Restaging. EXAM: CT CHEST, ABDOMEN, AND PELVIS WITH CONTRAST RECIST 1.1 REPORTING TECHNIQUE: Multidetector CT imaging of the chest, abdomen and pelvis was performed following the standard protocol during bolus administration of intravenous contrast. CONTRAST:  1030mISOVUE-300 IOPAMIDOL (ISOVUE-300) INJECTION 61% COMPARISON:  05/14/2015 FINDINGS: RECIST 1.1 Target Lesions: 1. Right lower lobe nodule on image 41/5: 1.7 cm in long axis, unchanged 2. Linear right lower lobe density  on image 44/5: 1.1 cm in long axis, unchanged. Non-target Lesions: 1. Sub solid nodule anteriorly in the right lower lobe on image 45/6: 6 mm, unchanged from 03/19/2015. 2. Right lower lobe nodule on image 51/5: 0.8 cm in long axis, previously 0.7 cm. CT CHEST FINDINGS Mediastinum/Nodes: Postoperative findings along the right mediastinal margin as before. No pathologic thoracic adenopathy. Lungs/Pleura: Right upper lobectomy. Right lower lobe bandlike scarring with largest triangular portion on axial images measuring 1.4 by 1.7 cm on image 41/5, not changed from prior and not subjectively different. Posterior nodule in the right lower lobe measures 0.8 by 0.5 cm on image 51/5, by my measurement previously 0.7 by 0.5 cm. No new nodule identified. Musculoskeletal: Mildly elevated right hemidiaphragm, stable. CT ABDOMEN PELVIS FINDINGS Hepatobiliary: Calcified gallstone in the gallbladder, 2.9 cm in long axis on image 56/2. Mild hepatic steatosis.  Pancreas: Unremarkable Spleen: Unremarkable Adrenals/Urinary Tract: Adrenal glands normal. Mild stable chronic bilateral perirenal stranding. Otherwise unremarkable. Stomach/Bowel: Unremarkable.  Appendix normal. Vascular/Lymphatic: Aortoiliac atherosclerotic vascular disease. No pathologic abdominal or pelvic adenopathy identified. Reproductive: Unremarkable Other: No supplemental non-categorized findings. Musculoskeletal: Lumbar spondylosis and degenerative disc disease with suspected foraminal impingement at the L5-S1 level and potentially on the left at L3-4. Fatty left spermatic cord. Small rim sclerotic lesion in the left femoral neck laterally, chronically stable, considered benign. IMPRESSION: 1. Similar appearance of bandlike densities and small nodules in the right lung, likely meriting surveillance. Prior right upper lobectomy. 2. Other imaging findings of potential clinical significance: Elevated right hemidiaphragm. Cholelithiasis. Hepatic steatosis. Lumbar spondylosis and degenerative disc disease with suspected impingement at L3-4 and L5-S1. Aortoiliac atherosclerotic vascular disease. Electronically Signed   By: Van Clines M.D.   On: 07/09/2015 10:28   ASSESSMENT AND PLAN: This is a very pleasant 70 years old white male with recurrent non-small cell lung cancer, adenocarcinoma with positive EGFR mutation in exon 21 that was initially diagnosed as stage IA non-small cell lung cancer status post right upper lobectomy and has been observation since October of 2007. He is status post 6 weeks treatment with Tarceva 150 mg by mouth daily according to the BMS checkmate 370 clinical trial. He is currently on Tarceva 100 mg by mouth daily for 12 months and and tolerating it fairly well with no significant skin rash, diarrhea. The recent CT scan of the chest, abdomen and pelvis showed stable disease. It also showed cholelithiasis which could explain his recent nausea. I recommended for the patient  to see Gen. surgery if his symptoms are getting worse. I recommended for the patient to continue his current treatment with Tarceva at the same dose. He would come back for follow-up visit in 2 weeks for reevaluation with repeat blood work. The patient was advised to call immediately if he has any other concerning symptoms in the interval.  All questions were answered. The patient knows to call the clinic with any problems, questions or concerns. We can certainly see the patient much sooner if necessary.  Disclaimer: This note was dictated with voice recognition software. Similar sounding words can inadvertently be transcribed and may not be corrected upon review.

## 2015-07-16 NOTE — Progress Notes (Signed)
07/16/2015 0945  BMS 370 cycle 29, week 56 The patient returns today with his wife.  He had resting VS with resting O2 sat. done. He reports that he had an episode of nausea and vomiting beginning the evening of 07/06/15-07/08/15.  He reports that his wife was sick with the same thing and that he feels it was a "bug" of some sort.  Since 06/07/15 he continues with mild nausea.  He said that despite the nausea, he has not missed taking any of his medication. He denies other symptoms including diarrhea, constipation, abdominal pain, change in breathing or pain.   His rash continues to be very mild, intermittent,  on his head, chest, neck , and back.  His has mild fatigue and dyspnea only on exertion.   Dr. [name] was in to see the patient and review his CT scan results.  He continues to show no signs of progression per Dr. [name].   MD performed a targeted physical exam.  Dr. [name] informed the patient to seek medical attention if his nausea worsens or does not subside soon.  He may have to see a surgeon regarding the cholelithiasis reported on recent CT scan. Based on lab results review and physical exam, Dr. [name] cleared the patient to continue treatment. This RN reminded the patient to call for any problems.  He confirmed and denied any further questions.

## 2015-07-28 NOTE — Progress Notes (Signed)
Correction to 07/16/15 0945 note It should say BMS 370 cycle 29, week 58

## 2015-07-29 ENCOUNTER — Other Ambulatory Visit: Payer: Self-pay | Admitting: *Deleted

## 2015-07-29 ENCOUNTER — Encounter: Payer: Self-pay | Admitting: Internal Medicine

## 2015-07-29 DIAGNOSIS — C3431 Malignant neoplasm of lower lobe, right bronchus or lung: Secondary | ICD-10-CM

## 2015-07-29 MED ORDER — ERLOTINIB HCL 100 MG PO TABS: 100.0000 mg | ORAL_TABLET | Freq: Every day | ORAL | Status: DC

## 2015-07-30 ENCOUNTER — Encounter: Payer: Medicare Other | Admitting: *Deleted

## 2015-07-30 ENCOUNTER — Other Ambulatory Visit (HOSPITAL_BASED_OUTPATIENT_CLINIC_OR_DEPARTMENT_OTHER): Payer: Medicare Other

## 2015-07-30 ENCOUNTER — Telehealth: Payer: Self-pay | Admitting: Internal Medicine

## 2015-07-30 ENCOUNTER — Ambulatory Visit (HOSPITAL_BASED_OUTPATIENT_CLINIC_OR_DEPARTMENT_OTHER): Payer: Medicare Other | Admitting: Internal Medicine

## 2015-07-30 ENCOUNTER — Encounter: Payer: Self-pay | Admitting: Internal Medicine

## 2015-07-30 ENCOUNTER — Other Ambulatory Visit (HOSPITAL_COMMUNITY)
Admission: AD | Admit: 2015-07-30 | Discharge: 2015-07-30 | Disposition: A | Discharge status: Home / Self Care | Payer: Medicare Other | Source: Ambulatory Visit | Attending: Internal Medicine | Admitting: Internal Medicine

## 2015-07-30 VITALS — BP 114/62 | HR 52 | Temp 97.8°F | Resp 18 | Ht 72.0 in | Wt 241.4 lb

## 2015-07-30 DIAGNOSIS — Z5111 Encounter for antineoplastic chemotherapy: Secondary | ICD-10-CM

## 2015-07-30 DIAGNOSIS — Z79899 Other long term (current) drug therapy: Secondary | ICD-10-CM

## 2015-07-30 DIAGNOSIS — C3431 Malignant neoplasm of lower lobe, right bronchus or lung: Secondary | ICD-10-CM | POA: Insufficient documentation

## 2015-07-30 DIAGNOSIS — C3491 Malignant neoplasm of unspecified part of right bronchus or lung: Secondary | ICD-10-CM | POA: Diagnosis not present

## 2015-07-30 DIAGNOSIS — Z006 Encounter for examination for normal comparison and control in clinical research program: Secondary | ICD-10-CM | POA: Diagnosis not present

## 2015-07-30 DIAGNOSIS — C3411 Malignant neoplasm of upper lobe, right bronchus or lung: Secondary | ICD-10-CM

## 2015-07-30 LAB — COMPREHENSIVE METABOLIC PANEL
ALT: 28 U/L (ref 0–55)
ANION GAP: 8 meq/L (ref 3–11)
AST: 26 U/L (ref 5–34)
Albumin: 3.9 g/dL (ref 3.5–5.0)
Alkaline Phosphatase: 56 U/L (ref 40–150)
BUN: 13.3 mg/dL (ref 7.0–26.0)
CALCIUM: 9.5 mg/dL (ref 8.4–10.4)
CHLORIDE: 104 meq/L (ref 98–109)
CO2: 28 meq/L (ref 22–29)
Creatinine: 1.1 mg/dL (ref 0.7–1.3)
EGFR: 72 mL/min/{1.73_m2} — ABNORMAL LOW (ref >90)
Glucose: 99 mg/dl (ref 70–140)
POTASSIUM: 3.6 meq/L (ref 3.5–5.1)
Sodium: 139 mEq/L (ref 136–145)
Total Bilirubin: 0.5 mg/dL (ref 0.20–1.20)
Total Protein: 6.6 g/dL (ref 6.4–8.3)

## 2015-07-30 LAB — LACTATE DEHYDROGENASE: LDH: 176 U/L (ref 125–245)

## 2015-07-30 LAB — CBC WITH DIFFERENTIAL/PLATELET
BASO%: 0.7 % (ref 0.0–2.0)
BASOS ABS: 0 10*3/uL (ref 0.0–0.1)
EOS%: 3.8 % (ref 0.0–7.0)
Eosinophils Absolute: 0.2 10*3/uL (ref 0.0–0.5)
HEMATOCRIT: 38.3 % — AB (ref 38.4–49.9)
HGB: 12.9 g/dL — ABNORMAL LOW (ref 13.0–17.1)
LYMPH#: 2.1 10*3/uL (ref 0.9–3.3)
LYMPH%: 40.2 % (ref 14.0–49.0)
MCH: 33.7 pg — AB (ref 27.2–33.4)
MCHC: 33.8 g/dL (ref 32.0–36.0)
MCV: 99.7 fL — ABNORMAL HIGH (ref 79.3–98.0)
MONO#: 0.6 10*3/uL (ref 0.1–0.9)
MONO%: 11.4 % (ref 0.0–14.0)
NEUT#: 2.3 10*3/uL (ref 1.5–6.5)
NEUT%: 43.9 % (ref 39.0–75.0)
PLATELETS: 229 10*3/uL (ref 140–400)
RBC: 3.85 10*6/uL — AB (ref 4.20–5.82)
RDW: 13.5 % (ref 11.0–14.6)
WBC: 5.3 10*3/uL (ref 4.0–10.3)

## 2015-07-30 LAB — MAGNESIUM: MAGNESIUM: 2.3 mg/dL (ref 1.5–2.5)

## 2015-07-30 LAB — PHOSPHORUS: PHOSPHORUS: 3.1 mg/dL (ref 2.5–4.6)

## 2015-07-30 LAB — TSH: TSH: 5.506 m[IU]/L — AB (ref 0.320–4.118)

## 2015-07-30 MED ORDER — ERLOTINIB HCL 100 MG PO TABS: 100.0000 mg | ORAL_TABLET | Freq: Every day | ORAL | Status: DC

## 2015-07-30 MED FILL — TARCEVA 100 MG TABLET: 100 | 30 days supply | Qty: 30 | Fill #0

## 2015-07-30 NOTE — Progress Notes (Signed)
[place] Telephone:(336) [phone]   Fax:(336) [phone]  OFFICE PROGRESS NOTE  [name],[name], MD 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 Roxan Hockey on 01/23/2006.  2) Tarceva 150 mg by mouth daily as part of the BMS checkmate 370 clinical trial. Status post 6 weeks of treatment.   CURRENT THERAPY: Tarceva 100 mg by mouth daily as part of the BMS checkmate 370 clinical trial. Started 07/17/2014 Status post 12 months of treatment.  DISEASE STAGE: Recurrent  CHEMOTHERAPY INTENT: Palliative.  CURRENT # OF CHEMOTHERAPY CYCLES: 13  CURRENT ANTIEMETICS: None  CURRENT SMOKING STATUS: Non-smoker  ORAL CHEMOTHERAPY AND CONSENT: None  CURRENT BISPHOSPHONATES USE: None  LIVING WILL AND CODE STATUS: Full code.  INTERVAL HISTORY: [name] 70 y.o. male returns to the clinic today for followup visit accompanied by his wife. The patient is feeling fine today and continues to tolerate his treatment with Tarceva well with no significant complaints. He denied having any skin rash or diarrhea. He denied having any significant chest pain, shortness of breath, cough or hemoptysis. The patient denied having any fever or chills, no nausea, vomiting, abdominal pain, diarrhea or constipation. He denied having any significant weight loss or night sweats. He had repeat bloodwork performed earlier today and is here for evaluation and discussion of his lab results.  MEDICAL HISTORY: Past Medical History  Diagnosis Date  . Hypertension   . Hypercholesterolemia   . Sinus disease     hx of  . History of pneumonia  last 3-4 yrs ago    3 -4  different times  . Hypothyroidism   . Sleep apnea     uses cpap setting opf 12  . History of agent Orange exposure 44-45 yrs ago  . Complication of anesthesia 2007    quit breathing with bronchoscopy, had to spend night  . lung ca dx'd 2007    lung right  . Cholelithiases 07/16/2015    ALLERGIES:  is allergic to lisinopril.  MEDICATIONS:  Current Outpatient Prescriptions  Medication Sig Dispense Refill  . amitriptyline (ELAVIL) 50 MG tablet Take 50 mg by mouth at bedtime. Reported on 06/18/2015    . aspirin 81 MG tablet Take 81 mg by mouth every morning.     . cholecalciferol (VITAMIN D) 1000 UNITS tablet Take 2,000 Units by mouth daily.     . clindamycin (CLEOCIN-T) 1 % external solution Apply topically 2 (two) times daily. 240 mL 2  . clindamycin (CLINDAGEL) 1 % gel   1  . diltiazem (CARDIZEM CD) 360 MG 24 hr capsule Take 360 mg by mouth every morning.     . erlotinib (TARCEVA) 100 MG tablet Take 1 tablet (100 mg total) by mouth daily. Take on an empty stomach 1 hour before meals or 2 hours after.    . levETIRAcetam (KEPPRA) 750 MG tablet     . levothyroxine (SYNTHROID, LEVOTHROID) 88 MCG tablet Take 88 mcg by mouth daily before breakfast.    . metoCLOPramide (REGLAN) 10 MG tablet Take 10 mg by mouth every 6 (six) hours as needed for nausea.     . metoprolol succinate (TOPROL-XL) 25 MG 24 hr tablet Take 25  mg by mouth every morning.     . Multiple Vitamins-Minerals (CENTRUM SILVER PO) Take 1 tablet by mouth daily.     . naproxen sodium (ANAPROX) 550 MG tablet Take 550 mg by mouth 3 (three) times daily as needed for moderate pain. Reported on 06/18/2015    . nitroGLYCERIN (NITROSTAT) 0.4 MG SL tablet Place 0.4 mg under the tongue every 5 (five) minutes as needed for chest pain. Reported on 07/16/2015    . polyethylene glycol (MIRALAX / GLYCOLAX) packet Take 17 g by mouth daily.    . psyllium (METAMUCIL) 58.6 % powder Take 1 packet by mouth 3 (three) times daily.    . rosuvastatin  (CRESTOR) 5 MG tablet Take 5 mg by mouth every morning.     . valsartan-hydrochlorothiazide (DIOVAN-HCT) 320-25 MG per tablet Take 1 tablet by mouth every morning.      No current facility-administered medications for this visit.    SURGICAL HISTORY:  Past Surgical History  Procedure Laterality Date  . Cystourethroscopy, gyrus turp.  04/16/2010  . Torn medial and lateral menisci, left knee.  11/06/2006  . Right video-assisted thoracoscopy, right upper lobectomy and  01/23/2006  . The olympus video bronchoscope was introduced via the right  12/21/2005  . Nasal sinus surgery  1987  . Tonsillectomy  1957  . Colonoscopy with propofol N/A 03/19/2013    Procedure: COLONOSCOPY WITH PROPOFOL;  Surgeon: Garlan Fair, MD;  Location: [facility];  Service: Endoscopy;  Laterality: N/A;    REVIEW OF SYSTEMS:  A comprehensive review of systems was negative.   PHYSICAL EXAMINATION: General appearance: alert, cooperative and no distress Head: Normocephalic, without obvious abnormality, atraumatic Neck: no adenopathy Lymph nodes: Cervical, supraclavicular, and axillary nodes normal. Resp: clear to auscultation bilaterally Back: symmetric, no curvature. ROM normal. No CVA tenderness. Cardio: regular rate and rhythm, S1, S2 normal, no murmur, click, rub or gallop GI: soft, non-tender; bowel sounds normal; no masses,  no organomegaly Extremities: extremities normal, atraumatic, no cyanosis or edema Neurologic: Alert and oriented X 3, normal strength and tone. Normal symmetric reflexes. Normal coordination and gait    ECOG PERFORMANCE STATUS: 0 - Asymptomatic  Blood pressure 114/62, pulse 52, temperature 97.8 F (36.6 C), temperature source Oral, resp. rate 18, height 6' (1.829 m), weight 241 lb 6.4 oz (109.498 kg), SpO2 99 %.  LABORATORY DATA: Lab Results  Component Value Date   WBC 5.3 07/30/2015   HGB 12.9* 07/30/2015   HCT 38.3* 07/30/2015   MCV 99.7* 07/30/2015   PLT 229 07/30/2015        Chemistry      Component Value Date/Time   NA 139 07/30/2015 0851   NA 138 07/17/2014 1000   NA 139 10/20/2011 0903   K 3.6 07/30/2015 0851   K 3.3* 07/17/2014 1000   K 3.4 10/20/2011 0903   CL 107 07/17/2014 1000   CL 102 04/24/2012 0853   CL 101 10/20/2011 0903   CO2 28 07/30/2015 0851   CO2 26 07/17/2014 1000   CO2 29 10/20/2011 0903   BUN 13.3 07/30/2015 0851   BUN 16 07/17/2014 1000   BUN 12 10/20/2011 0903   CREATININE 1.1 07/30/2015 0851   CREATININE 0.82 07/17/2014 1000   CREATININE 0.9 10/20/2011 0903      Component Value Date/Time   CALCIUM 9.5 07/30/2015 0851   CALCIUM 8.9 07/17/2014 1000   CALCIUM 9.0 10/20/2011 0903   ALKPHOS 56 07/30/2015 0851   ALKPHOS 53 07/17/2014 1000   ALKPHOS  52 10/20/2011 0903   AST 26 07/30/2015 0851   AST 24 07/17/2014 1000   AST 24 10/20/2011 0903   ALT 28 07/30/2015 0851   ALT 28 07/17/2014 1000   ALT 31 10/20/2011 0903   BILITOT 0.50 07/30/2015 0851   BILITOT 0.8 07/17/2014 1000   BILITOT 0.60 10/20/2011 0903       RADIOGRAPHIC STUDIES: Ct Chest W Contrast  07/09/2015  CLINICAL DATA:  Right lower lobe lung cancer. Chemotherapy. Restaging. EXAM: CT CHEST, ABDOMEN, AND PELVIS WITH CONTRAST RECIST 1.1 REPORTING TECHNIQUE: Multidetector CT imaging of the chest, abdomen and pelvis was performed following the standard protocol during bolus administration of intravenous contrast. CONTRAST:  186m ISOVUE-300 IOPAMIDOL (ISOVUE-300) INJECTION 61% COMPARISON:  05/14/2015 FINDINGS: RECIST 1.1 Target Lesions: 1. Right lower lobe nodule on image 41/5: 1.7 cm in long axis, unchanged 2. Linear right lower lobe density on image 44/5: 1.1 cm in long axis, unchanged. Non-target Lesions: 1. Sub solid nodule anteriorly in the right lower lobe on image 45/6: 6 mm, unchanged from 03/19/2015. 2. Right lower lobe nodule on image 51/5: 0.8 cm in long axis, previously 0.7 cm. CT CHEST FINDINGS Mediastinum/Nodes: Postoperative findings along the right  mediastinal margin as before. No pathologic thoracic adenopathy. Lungs/Pleura: Right upper lobectomy. Right lower lobe bandlike scarring with largest triangular portion on axial images measuring 1.4 by 1.7 cm on image 41/5, not changed from prior and not subjectively different. Posterior nodule in the right lower lobe measures 0.8 by 0.5 cm on image 51/5, by my measurement previously 0.7 by 0.5 cm. No new nodule identified. Musculoskeletal: Mildly elevated right hemidiaphragm, stable. CT ABDOMEN PELVIS FINDINGS Hepatobiliary: Calcified gallstone in the gallbladder, 2.9 cm in long axis on image 56/2. Mild hepatic steatosis. Pancreas: Unremarkable Spleen: Unremarkable Adrenals/Urinary Tract: Adrenal glands normal. Mild stable chronic bilateral perirenal stranding. Otherwise unremarkable. Stomach/Bowel: Unremarkable.  Appendix normal. Vascular/Lymphatic: Aortoiliac atherosclerotic vascular disease. No pathologic abdominal or pelvic adenopathy identified. Reproductive: Unremarkable Other: No supplemental non-categorized findings. Musculoskeletal: Lumbar spondylosis and degenerative disc disease with suspected foraminal impingement at the L5-S1 level and potentially on the left at L3-4. Fatty left spermatic cord. Small rim sclerotic lesion in the left femoral neck laterally, chronically stable, considered benign. IMPRESSION: 1. Similar appearance of bandlike densities and small nodules in the right lung, likely meriting surveillance. Prior right upper lobectomy. 2. Other imaging findings of potential clinical significance: Elevated right hemidiaphragm. Cholelithiasis. Hepatic steatosis. Lumbar spondylosis and degenerative disc disease with suspected impingement at L3-4 and L5-S1. Aortoiliac atherosclerotic vascular disease. Electronically Signed   By: WVan ClinesM.D.   On: 07/09/2015 10:28   Ct Abdomen Pelvis W Contrast  07/09/2015  CLINICAL DATA:  Right lower lobe lung cancer. Chemotherapy. Restaging. EXAM: CT  CHEST, ABDOMEN, AND PELVIS WITH CONTRAST RECIST 1.1 REPORTING TECHNIQUE: Multidetector CT imaging of the chest, abdomen and pelvis was performed following the standard protocol during bolus administration of intravenous contrast. CONTRAST:  1070mISOVUE-300 IOPAMIDOL (ISOVUE-300) INJECTION 61% COMPARISON:  05/14/2015 FINDINGS: RECIST 1.1 Target Lesions: 1. Right lower lobe nodule on image 41/5: 1.7 cm in long axis, unchanged 2. Linear right lower lobe density on image 44/5: 1.1 cm in long axis, unchanged. Non-target Lesions: 1. Sub solid nodule anteriorly in the right lower lobe on image 45/6: 6 mm, unchanged from 03/19/2015. 2. Right lower lobe nodule on image 51/5: 0.8 cm in long axis, previously 0.7 cm. CT CHEST FINDINGS Mediastinum/Nodes: Postoperative findings along the right mediastinal margin as before. No pathologic thoracic adenopathy. Lungs/Pleura:  Right upper lobectomy. Right lower lobe bandlike scarring with largest triangular portion on axial images measuring 1.4 by 1.7 cm on image 41/5, not changed from prior and not subjectively different. Posterior nodule in the right lower lobe measures 0.8 by 0.5 cm on image 51/5, by my measurement previously 0.7 by 0.5 cm. No new nodule identified. Musculoskeletal: Mildly elevated right hemidiaphragm, stable. CT ABDOMEN PELVIS FINDINGS Hepatobiliary: Calcified gallstone in the gallbladder, 2.9 cm in long axis on image 56/2. Mild hepatic steatosis. Pancreas: Unremarkable Spleen: Unremarkable Adrenals/Urinary Tract: Adrenal glands normal. Mild stable chronic bilateral perirenal stranding. Otherwise unremarkable. Stomach/Bowel: Unremarkable.  Appendix normal. Vascular/Lymphatic: Aortoiliac atherosclerotic vascular disease. No pathologic abdominal or pelvic adenopathy identified. Reproductive: Unremarkable Other: No supplemental non-categorized findings. Musculoskeletal: Lumbar spondylosis and degenerative disc disease with suspected foraminal impingement at the L5-S1  level and potentially on the left at L3-4. Fatty left spermatic cord. Small rim sclerotic lesion in the left femoral neck laterally, chronically stable, considered benign. IMPRESSION: 1. Similar appearance of bandlike densities and small nodules in the right lung, likely meriting surveillance. Prior right upper lobectomy. 2. Other imaging findings of potential clinical significance: Elevated right hemidiaphragm. Cholelithiasis. Hepatic steatosis. Lumbar spondylosis and degenerative disc disease with suspected impingement at L3-4 and L5-S1. Aortoiliac atherosclerotic vascular disease. Electronically Signed   By: Van Clines M.D.   On: 07/09/2015 10:28   ASSESSMENT AND PLAN: This is a very pleasant 70 years old white male with recurrent non-small cell lung cancer, adenocarcinoma with positive EGFR mutation in exon 21 that was initially diagnosed as stage IA non-small cell lung cancer status post right upper lobectomy and has been observation since October of 2007. He is status post 6 weeks treatment with Tarceva 150 mg by mouth daily according to the BMS checkmate 370 clinical trial. He is currently on Tarceva 100 mg by mouth daily for more than 12 months and and tolerating it fairly well with no significant skin rash, diarrhea. I recommended for the patient to continue his current treatment with Tarceva at the same dose. He would come back for follow-up visit in 2 weeks for reevaluation with repeat blood work. The patient was advised to call immediately if he has any other concerning symptoms in the interval.  All questions were answered. The patient knows to call the clinic with any problems, questions or concerns. We can certainly see the patient much sooner if necessary.  Disclaimer: This note was dictated with voice recognition software. Similar sounding words can inadvertently be transcribed and may not be corrected upon review.

## 2015-07-30 NOTE — Telephone Encounter (Signed)
Gave pt appt & avs °

## 2015-07-30 NOTE — Progress Notes (Signed)
tarceva refilled called to [facility] with 1 refill.

## 2015-07-30 NOTE — Progress Notes (Signed)
07/30/2015 Patient in to clinic this morning accompanied by his wife. Vital signs and pulse oximetry were obtained at rest. Patient states that the previously reported nausea only lasted about three days. Based on lab results review and history and physical by Dr. [name], patient condition was acceptable for continued treatment. Patient requested refill of Tarceva to pick up today; prescription was verified and previously signed by Dr. [name] and prescription was called in to [facility] today by Abelina Bachelor RN. Patient proceeded to scheduling for May and June appointments, per his request.  Due to elevated TSH today, reflex T3 and T4 tests have been ordered for further evaluation.  Cindy S. Brigitte Pulse BSN, RN, CCRP 07/30/2015 10:30 AM

## 2015-07-31 ENCOUNTER — Encounter: Payer: Self-pay | Admitting: *Deleted

## 2015-07-31 LAB — T3: T3 TOTAL: 84 ng/dL (ref 71–180)

## 2015-07-31 LAB — T4: Thyroxine (T4): 7.7 ug/dL (ref 4.5–12.0)

## 2015-07-31 NOTE — Progress Notes (Signed)
07/31/15- at 8:58am- The pt's TSH was elevated on 07/30/15.  A T3 and T4 were ordered for further evaluation.  The T3 and T4 level were within normal limits.  Dr. [name] was notified of the pt's normal T3 and T4 levels.   Brion Aliment RN, BSN, CCRP Clinical Research Nurse 07/31/2015 9:00 AM

## 2015-08-04 ENCOUNTER — Other Ambulatory Visit: Payer: Self-pay | Admitting: *Deleted

## 2015-08-06 DIAGNOSIS — E559 Vitamin D deficiency, unspecified: Secondary | ICD-10-CM | POA: Diagnosis not present

## 2015-08-06 DIAGNOSIS — E039 Hypothyroidism, unspecified: Secondary | ICD-10-CM | POA: Diagnosis not present

## 2015-08-06 DIAGNOSIS — Z0001 Encounter for general adult medical examination with abnormal findings: Secondary | ICD-10-CM | POA: Diagnosis not present

## 2015-08-06 DIAGNOSIS — K219 Gastro-esophageal reflux disease without esophagitis: Secondary | ICD-10-CM | POA: Diagnosis not present

## 2015-08-06 DIAGNOSIS — Z6832 Body mass index (BMI) 32.0-32.9, adult: Secondary | ICD-10-CM | POA: Diagnosis not present

## 2015-08-06 DIAGNOSIS — Z1389 Encounter for screening for other disorder: Secondary | ICD-10-CM | POA: Diagnosis not present

## 2015-08-06 DIAGNOSIS — Z125 Encounter for screening for malignant neoplasm of prostate: Secondary | ICD-10-CM | POA: Diagnosis not present

## 2015-08-06 DIAGNOSIS — E782 Mixed hyperlipidemia: Secondary | ICD-10-CM | POA: Diagnosis not present

## 2015-08-06 DIAGNOSIS — I10 Essential (primary) hypertension: Secondary | ICD-10-CM | POA: Diagnosis not present

## 2015-08-06 DIAGNOSIS — G4730 Sleep apnea, unspecified: Secondary | ICD-10-CM | POA: Diagnosis not present

## 2015-08-06 DIAGNOSIS — I25110 Atherosclerotic heart disease of native coronary artery with unstable angina pectoris: Secondary | ICD-10-CM | POA: Diagnosis not present

## 2015-08-06 DIAGNOSIS — E669 Obesity, unspecified: Secondary | ICD-10-CM | POA: Diagnosis not present

## 2015-08-12 ENCOUNTER — Encounter: Payer: Self-pay | Admitting: Internal Medicine

## 2015-08-12 ENCOUNTER — Encounter: Payer: Self-pay | Admitting: *Deleted

## 2015-08-12 DIAGNOSIS — C3431 Malignant neoplasm of lower lobe, right bronchus or lung: Secondary | ICD-10-CM

## 2015-08-13 ENCOUNTER — Other Ambulatory Visit (HOSPITAL_COMMUNITY)
Admission: RE | Admit: 2015-08-13 | Discharge: 2015-08-13 | Disposition: A | Discharge status: Home / Self Care | Payer: Medicare Other | Source: Ambulatory Visit | Attending: Internal Medicine | Admitting: Internal Medicine

## 2015-08-13 ENCOUNTER — Encounter: Payer: Self-pay | Admitting: Internal Medicine

## 2015-08-13 ENCOUNTER — Ambulatory Visit (HOSPITAL_BASED_OUTPATIENT_CLINIC_OR_DEPARTMENT_OTHER): Payer: Medicare Other

## 2015-08-13 ENCOUNTER — Ambulatory Visit (HOSPITAL_BASED_OUTPATIENT_CLINIC_OR_DEPARTMENT_OTHER): Payer: Medicare Other | Admitting: Internal Medicine

## 2015-08-13 VITALS — BP 102/60 | HR 63 | Temp 97.8°F | Resp 18 | Ht 72.0 in | Wt 238.3 lb

## 2015-08-13 DIAGNOSIS — Z006 Encounter for examination for normal comparison and control in clinical research program: Secondary | ICD-10-CM

## 2015-08-13 DIAGNOSIS — C3431 Malignant neoplasm of lower lobe, right bronchus or lung: Secondary | ICD-10-CM | POA: Insufficient documentation

## 2015-08-13 DIAGNOSIS — C3491 Malignant neoplasm of unspecified part of right bronchus or lung: Secondary | ICD-10-CM

## 2015-08-13 DIAGNOSIS — Z5111 Encounter for antineoplastic chemotherapy: Secondary | ICD-10-CM

## 2015-08-13 LAB — COMPREHENSIVE METABOLIC PANEL
ALT: 33 U/L (ref 0–55)
ANION GAP: 7 meq/L (ref 3–11)
AST: 35 U/L — ABNORMAL HIGH (ref 5–34)
Albumin: 3.9 g/dL (ref 3.5–5.0)
Alkaline Phosphatase: 57 U/L (ref 40–150)
BILIRUBIN TOTAL: 0.41 mg/dL (ref 0.20–1.20)
BUN: 15.4 mg/dL (ref 7.0–26.0)
CHLORIDE: 106 meq/L (ref 98–109)
CO2: 28 mEq/L (ref 22–29)
CREATININE: 1.1 mg/dL (ref 0.7–1.3)
Calcium: 9.4 mg/dL (ref 8.4–10.4)
EGFR: 66 mL/min/{1.73_m2} — AB (ref >90)
Glucose: 99 mg/dl (ref 70–140)
Potassium: 3.7 mEq/L (ref 3.5–5.1)
SODIUM: 141 meq/L (ref 136–145)
Total Protein: 6.6 g/dL (ref 6.4–8.3)

## 2015-08-13 LAB — CBC WITH DIFFERENTIAL/PLATELET
BASO%: 0.2 % (ref 0.0–2.0)
Basophils Absolute: 0 10*3/uL (ref 0.0–0.1)
EOS%: 4.2 % (ref 0.0–7.0)
Eosinophils Absolute: 0.2 10*3/uL (ref 0.0–0.5)
HCT: 39.2 % (ref 38.4–49.9)
HGB: 13.4 g/dL (ref 13.0–17.1)
LYMPH%: 40.5 % (ref 14.0–49.0)
MCH: 33.7 pg — ABNORMAL HIGH (ref 27.2–33.4)
MCHC: 34.2 g/dL (ref 32.0–36.0)
MCV: 98.5 fL — ABNORMAL HIGH (ref 79.3–98.0)
MONO#: 0.6 10*3/uL (ref 0.1–0.9)
MONO%: 10.5 % (ref 0.0–14.0)
NEUT#: 2.5 10*3/uL (ref 1.5–6.5)
NEUT%: 44.6 % (ref 39.0–75.0)
PLATELETS: 196 10*3/uL (ref 140–400)
RBC: 3.98 10*6/uL — AB (ref 4.20–5.82)
RDW: 12.8 % (ref 11.0–14.6)
WBC: 5.5 10*3/uL (ref 4.0–10.3)
lymph#: 2.2 10*3/uL (ref 0.9–3.3)

## 2015-08-13 LAB — LACTATE DEHYDROGENASE: LDH: 207 U/L (ref 125–245)

## 2015-08-13 LAB — PHOSPHORUS: Phosphorus: 2.8 mg/dL (ref 2.5–4.6)

## 2015-08-13 LAB — MAGNESIUM: MAGNESIUM: 2.4 mg/dL (ref 1.5–2.5)

## 2015-08-13 NOTE — Progress Notes (Signed)
[name] Telephone:(336) [phone]   Fax:(336) [phone]  OFFICE PROGRESS NOTE  [name],[name], MD 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 Roxan Hockey on 01/23/2006.  2) Tarceva 150 mg by mouth daily as part of the BMS checkmate 370 clinical trial. Status post 6 weeks of treatment.   CURRENT THERAPY: Tarceva 100 mg by mouth daily as part of the BMS checkmate 370 clinical trial. Started 07/17/2014 Status post 12 months of treatment.  DISEASE STAGE: Recurrent  CHEMOTHERAPY INTENT: Palliative.  CURRENT # OF CHEMOTHERAPY CYCLES: 13  CURRENT ANTIEMETICS: None  CURRENT SMOKING STATUS: Non-smoker  ORAL CHEMOTHERAPY AND CONSENT: None  CURRENT BISPHOSPHONATES USE: None  LIVING WILL AND CODE STATUS: Full code.  INTERVAL HISTORY: [name] 70 y.o. male returns to the clinic today for followup visit accompanied by his wife. The patient is feeling fine today and continues to tolerate his treatment with Tarceva well with no significant complaints except for occasional nausea resolved with Compazine. He denied having any skin rash or diarrhea. He denied having any significant chest pain, shortness of breath, cough or hemoptysis. The patient denied having any fever or chills, no vomiting, abdominal pain, diarrhea or constipation. He denied having any significant weight loss or night sweats. He had repeat bloodwork performed earlier today and is here for evaluation and discussion of his lab results.  MEDICAL HISTORY: Past Medical History  Diagnosis Date  . Hypertension   . Hypercholesterolemia   . Sinus disease     hx of  . History  of pneumonia  last 3-4 yrs ago    3 -4 different times  . Hypothyroidism   . Sleep apnea     uses cpap setting opf 12  . History of agent Orange exposure 44-45 yrs ago  . Complication of anesthesia 2007    quit breathing with bronchoscopy, had to spend night  . lung ca dx'd 2007    lung right  . Cholelithiases 07/16/2015    ALLERGIES:  is allergic to lisinopril.  MEDICATIONS:  Current Outpatient Prescriptions  Medication Sig Dispense Refill  . amitriptyline (ELAVIL) 50 MG tablet Take 50 mg by mouth at bedtime. Reported on 06/18/2015    . aspirin 81 MG tablet Take 81 mg by mouth every morning.     . clindamycin (CLEOCIN-T) 1 % external solution Apply topically 2 (two) times daily. 240 mL 2  . diltiazem (CARDIZEM CD) 360 MG 24 hr capsule Take 360 mg by mouth every morning.     . erlotinib (TARCEVA) 100 MG tablet Take 1 tablet (100 mg total) by mouth daily. Take on an empty stomach 1 hour before meals or 2 hours after.    . levETIRAcetam (KEPPRA) 750 MG tablet     . levothyroxine (SYNTHROID, LEVOTHROID) 88 MCG tablet Take 88 mcg by mouth daily before breakfast.    . metoCLOPramide (REGLAN) 10 MG tablet Take 10 mg by mouth every 6 (six) hours as needed for nausea.     . metoprolol succinate (TOPROL-XL) 25 MG 24 hr tablet Take 25 mg by mouth every morning.     . Multiple Vitamins-Minerals (CENTRUM SILVER PO) Take 1 tablet by mouth daily.     [name]  naproxen sodium (ANAPROX) 550 MG tablet Take 550 mg by mouth 3 (three) times daily as needed for moderate pain. Reported on 06/18/2015    . nitroGLYCERIN (NITROSTAT) 0.4 MG SL tablet Place 0.4 mg under the tongue every 5 (five) minutes as needed for chest pain. Reported on 07/30/2015    . polyethylene glycol (MIRALAX / GLYCOLAX) packet Take 17 g by mouth daily.    . psyllium (METAMUCIL) 58.6 % powder Take 1 packet by mouth 3 (three) times daily.    . rosuvastatin (CRESTOR) 5 MG tablet Take 5 mg by mouth every morning.     . valsartan-hydrochlorothiazide  (DIOVAN-HCT) 320-25 MG per tablet Take 1 tablet by mouth every morning.      No current facility-administered medications for this visit.    SURGICAL HISTORY:  Past Surgical History  Procedure Laterality Date  . Cystourethroscopy, gyrus turp.  04/16/2010  . Torn medial and lateral menisci, left knee.  11/06/2006  . Right video-assisted thoracoscopy, right upper lobectomy and  01/23/2006  . The olympus video bronchoscope was introduced via the right  12/21/2005  . Nasal sinus surgery  1987  . Tonsillectomy  1957  . Colonoscopy with propofol N/A 03/19/2013    Procedure: COLONOSCOPY WITH PROPOFOL;  Surgeon: Garlan Fair, MD;  Location: [facility];  Service: Endoscopy;  Laterality: N/A;    REVIEW OF SYSTEMS:  A comprehensive review of systems was negative.   PHYSICAL EXAMINATION: General appearance: alert, cooperative and no distress Head: Normocephalic, without obvious abnormality, atraumatic Neck: no adenopathy Lymph nodes: Cervical, supraclavicular, and axillary nodes normal. Resp: clear to auscultation bilaterally Back: symmetric, no curvature. ROM normal. No CVA tenderness. Cardio: regular rate and rhythm, S1, S2 normal, no murmur, click, rub or gallop GI: soft, non-tender; bowel sounds normal; no masses,  no organomegaly Extremities: extremities normal, atraumatic, no cyanosis or edema Neurologic: Alert and oriented X 3, normal strength and tone. Normal symmetric reflexes. Normal coordination and gait    ECOG PERFORMANCE STATUS: 0 - Asymptomatic  Blood pressure 102/60, pulse 63, temperature 97.8 F (36.6 C), temperature source Oral, resp. rate 18, height 6' (1.829 m), weight 238 lb 4.8 oz (108.092 kg), SpO2 100 %.  LABORATORY DATA: Lab Results  Component Value Date   WBC 5.5 08/13/2015   HGB 13.4 08/13/2015   HCT 39.2 08/13/2015   MCV 98.5* 08/13/2015   PLT 196 08/13/2015      Chemistry      Component Value Date/Time   NA 139 07/30/2015 0851   NA 138  07/17/2014 1000   NA 139 10/20/2011 0903   K 3.6 07/30/2015 0851   K 3.3* 07/17/2014 1000   K 3.4 10/20/2011 0903   CL 107 07/17/2014 1000   CL 102 04/24/2012 0853   CL 101 10/20/2011 0903   CO2 28 07/30/2015 0851   CO2 26 07/17/2014 1000   CO2 29 10/20/2011 0903   BUN 13.3 07/30/2015 0851   BUN 16 07/17/2014 1000   BUN 12 10/20/2011 0903   CREATININE 1.1 07/30/2015 0851   CREATININE 0.82 07/17/2014 1000   CREATININE 0.9 10/20/2011 0903      Component Value Date/Time   CALCIUM 9.5 07/30/2015 0851   CALCIUM 8.9 07/17/2014 1000   CALCIUM 9.0 10/20/2011 0903   ALKPHOS 56 07/30/2015 0851   ALKPHOS 53 07/17/2014 1000   ALKPHOS 52 10/20/2011 0903   AST 26 07/30/2015 0851   AST 24 07/17/2014 1000   AST 24 10/20/2011 0903   ALT 28 07/30/2015 0851  ALT 28 07/17/2014 1000   ALT 31 10/20/2011 0903   BILITOT 0.50 07/30/2015 0851   BILITOT 0.8 07/17/2014 1000   BILITOT 0.60 10/20/2011 0903       RADIOGRAPHIC STUDIES: No results found. ASSESSMENT AND PLAN: This is a very pleasant 70 years old white male with recurrent non-small cell lung cancer, adenocarcinoma with positive EGFR mutation in exon 21 that was initially diagnosed as stage IA non-small cell lung cancer status post right upper lobectomy and has been observation since October of 2007. He is status post 6 weeks treatment with Tarceva 150 mg by mouth daily according to the BMS checkmate 370 clinical trial. He is currently on Tarceva 100 mg by mouth daily for 13 months and and tolerating it fairly well with no significant skin rash, diarrhea. I recommended for the patient to continue his current treatment with Tarceva at the same dose. He would come back for follow-up visit in 2 weeks for reevaluation with repeat blood work. She will have repeat CT scan of the chest, abdomen and pelvis on 09/03/2015. The patient was advised to call immediately if he has any other concerning symptoms in the interval.  All questions were  answered. The patient knows to call the clinic with any problems, questions or concerns. We can certainly see the patient much sooner if necessary.  Disclaimer: This note was dictated with voice recognition software. Similar sounding words can inadvertently be transcribed and may not be corrected upon review.

## 2015-08-13 NOTE — Progress Notes (Unsigned)
error 

## 2015-08-13 NOTE — Progress Notes (Signed)
08/13/2015 0900 The patient returns to clinic accompanied by his wife.  He states he has had a good last 2 weeks.  He does state he has had mild nausea off and on and he states he has not taken any medication for the nausea as it has not interfered with his eating or ADLs.  The patient reports his rash on head, neck,and chest comes and goers.  He continues to apply the Clindagel prn and has always done this.   He continues to walk in his neighborhood and only has mild dyspnea on exertion.  His fatigue level is mild.  His toenail fungus is unchanged, no worse.  He denies diarrhea, constipation, pain, or other complaints.  On Aug 06, 2015 he saw Dr. Inda Merlin, who monitors his Thyroid, and, per patient, "Dr. Inda Merlin does not want to change anything and will continue to monitor TSH levels". The patient denies other complaints and was reminded to call if something changes.  The patient will be scheduled for CT scan due 09/03/15. The patient confirmed he has not missed taking any of his Tarceva and gave this RN his medication diary. Marcellus Scott, RN, BSN, MHA, OCN

## 2015-08-13 NOTE — Progress Notes (Signed)
error 

## 2015-08-19 DIAGNOSIS — G43019 Migraine without aura, intractable, without status migrainosus: Secondary | ICD-10-CM | POA: Diagnosis not present

## 2015-08-19 DIAGNOSIS — G43111 Migraine with aura, intractable, with status migrainosus: Secondary | ICD-10-CM | POA: Diagnosis not present

## 2015-08-25 MED FILL — TARCEVA 100 MG TABLET: 100 | 30 days supply | Qty: 30 | Fill #1

## 2015-08-26 ENCOUNTER — Encounter: Payer: Self-pay | Admitting: *Deleted

## 2015-08-26 ENCOUNTER — Encounter: Payer: Self-pay | Admitting: Internal Medicine

## 2015-08-26 DIAGNOSIS — Z79899 Other long term (current) drug therapy: Secondary | ICD-10-CM

## 2015-08-26 DIAGNOSIS — C3431 Malignant neoplasm of lower lobe, right bronchus or lung: Secondary | ICD-10-CM

## 2015-08-26 MED ORDER — ERLOTINIB HCL 100 MG PO TABS: 100.0000 mg | ORAL_TABLET | Freq: Every day | ORAL | Status: DC

## 2015-08-27 ENCOUNTER — Other Ambulatory Visit: Payer: Self-pay | Admitting: *Deleted

## 2015-08-27 ENCOUNTER — Encounter: Payer: Self-pay | Admitting: Internal Medicine

## 2015-08-27 ENCOUNTER — Telehealth: Payer: Self-pay | Admitting: Internal Medicine

## 2015-08-27 ENCOUNTER — Ambulatory Visit (HOSPITAL_BASED_OUTPATIENT_CLINIC_OR_DEPARTMENT_OTHER): Payer: Medicare Other | Admitting: Internal Medicine

## 2015-08-27 ENCOUNTER — Other Ambulatory Visit (HOSPITAL_BASED_OUTPATIENT_CLINIC_OR_DEPARTMENT_OTHER): Payer: Medicare Other

## 2015-08-27 ENCOUNTER — Other Ambulatory Visit (HOSPITAL_COMMUNITY)
Admission: AD | Admit: 2015-08-27 | Discharge: 2015-08-27 | Disposition: A | Discharge status: Home / Self Care | Payer: Medicare Other | Source: Ambulatory Visit | Attending: Internal Medicine | Admitting: Internal Medicine

## 2015-08-27 VITALS — BP 128/69 | HR 63 | Temp 98.0°F | Resp 18 | Ht 72.0 in | Wt 240.9 lb

## 2015-08-27 DIAGNOSIS — C3491 Malignant neoplasm of unspecified part of right bronchus or lung: Secondary | ICD-10-CM | POA: Diagnosis not present

## 2015-08-27 DIAGNOSIS — C3431 Malignant neoplasm of lower lobe, right bronchus or lung: Secondary | ICD-10-CM | POA: Insufficient documentation

## 2015-08-27 DIAGNOSIS — Z006 Encounter for examination for normal comparison and control in clinical research program: Secondary | ICD-10-CM

## 2015-08-27 DIAGNOSIS — Z5111 Encounter for antineoplastic chemotherapy: Secondary | ICD-10-CM

## 2015-08-27 LAB — COMPREHENSIVE METABOLIC PANEL
ALBUMIN: 4.1 g/dL (ref 3.5–5.0)
ALK PHOS: 57 U/L (ref 40–150)
ALT: 32 U/L (ref 0–55)
ANION GAP: 8 meq/L (ref 3–11)
AST: 28 U/L (ref 5–34)
BILIRUBIN TOTAL: 0.49 mg/dL (ref 0.20–1.20)
BUN: 16.4 mg/dL (ref 7.0–26.0)
CALCIUM: 9.9 mg/dL (ref 8.4–10.4)
CO2: 30 mEq/L — ABNORMAL HIGH (ref 22–29)
Chloride: 104 mEq/L (ref 98–109)
Creatinine: 1.1 mg/dL (ref 0.7–1.3)
EGFR: 72 mL/min/{1.73_m2} — AB (ref >90)
Glucose: 85 mg/dl (ref 70–140)
Potassium: 4 mEq/L (ref 3.5–5.1)
Sodium: 141 mEq/L (ref 136–145)
TOTAL PROTEIN: 6.9 g/dL (ref 6.4–8.3)

## 2015-08-27 LAB — CBC WITH DIFFERENTIAL/PLATELET
BASO%: 0.4 % (ref 0.0–2.0)
Basophils Absolute: 0 10*3/uL (ref 0.0–0.1)
EOS ABS: 0.2 10*3/uL (ref 0.0–0.5)
EOS%: 3.6 % (ref 0.0–7.0)
HEMATOCRIT: 39 % (ref 38.4–49.9)
HEMOGLOBIN: 13.3 g/dL (ref 13.0–17.1)
LYMPH%: 38.9 % (ref 14.0–49.0)
MCH: 33.5 pg — ABNORMAL HIGH (ref 27.2–33.4)
MCHC: 34.1 g/dL (ref 32.0–36.0)
MCV: 98.2 fL — AB (ref 79.3–98.0)
MONO#: 0.6 10*3/uL (ref 0.1–0.9)
MONO%: 11.3 % (ref 0.0–14.0)
NEUT%: 45.8 % (ref 39.0–75.0)
NEUTROS ABS: 2.4 10*3/uL (ref 1.5–6.5)
PLATELETS: 232 10*3/uL (ref 140–400)
RBC: 3.97 10*6/uL — ABNORMAL LOW (ref 4.20–5.82)
RDW: 12.8 % (ref 11.0–14.6)
WBC: 5.3 10*3/uL (ref 4.0–10.3)
lymph#: 2.1 10*3/uL (ref 0.9–3.3)

## 2015-08-27 LAB — MAGNESIUM: MAGNESIUM: 2.4 mg/dL (ref 1.5–2.5)

## 2015-08-27 LAB — PHOSPHORUS: Phosphorus: 2.9 mg/dL (ref 2.5–4.6)

## 2015-08-27 LAB — LACTATE DEHYDROGENASE: LDH: 181 U/L (ref 125–245)

## 2015-08-27 MED ORDER — ERLOTINIB HCL 100 MG PO TABS: 100.0000 mg | ORAL_TABLET | Freq: Every day | ORAL | Status: DC

## 2015-08-27 NOTE — Progress Notes (Signed)
08/27/2015 1215 BMS 370 Group D, Arm A The patient returns to the clinic today for visit cycle 33, week 64.  He is accompanied by his wife. He had labs, resting vitals and resting O2 sat. done along with weight.   He reports no changes in his side effects except that he has not experienced any further nausea since 08/13/15.  He denies changes in his breathing and continues to walk regularly.  He denies fever, cough, or diarrhea.  His skin rash remains intermittent and unchanged.  He still has a toenail fungus that is unchanged.   Dr. [name] was in to perform a targeted PE.  Based on lab results review and PE, Dr. [name] cleared the patient to continue protocol treatment. The patient was informed he needed to be re-consented to the BMS 370 Group D protocol version 03, dated 07/21/2015.  The entire consent form was reviewed with the patient and the patient was made aware of the reason for the consent changes and the rationale for Group D treatment.  The patient had no questions and verbalized understanding.  He signed the consent form version 03 date 07/21/15 and was given a copy of the signed consent.  Approximately 20 minutes was spent with the patient for re-consent.  The patient understands to call for any problems or concerns.  He will have a CT scan on 09/03/15.  This RN thanked the patient for his time.

## 2015-08-27 NOTE — Progress Notes (Signed)
[facility] [phone]   [phone]  OFFICE PROGRESS NOTE  [name],[name], MD 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 Roxan Hockey on 01/23/2006.  2) Tarceva 150 mg by mouth daily as part of the BMS checkmate 370 clinical trial. Status post 6 weeks of treatment.   CURRENT THERAPY: Tarceva 100 mg by mouth daily as part of the BMS checkmate 370 clinical trial. Started 07/17/2014 Status post 13 months of treatment.  DISEASE STAGE: Recurrent  CHEMOTHERAPY INTENT: Palliative.  CURRENT # OF CHEMOTHERAPY CYCLES: 14  CURRENT ANTIEMETICS: None  CURRENT SMOKING STATUS: Non-smoker  ORAL CHEMOTHERAPY AND CONSENT: None  CURRENT BISPHOSPHONATES USE: None  LIVING WILL AND CODE STATUS: Full code.  INTERVAL HISTORY: [name] 70 y.o. male returns to the clinic today for followup visit accompanied by his wife. The patient is doing fine as usual with no complaints today. He is tolerating his treatment with Tarceva fairly well. He denied having any skin rash or diarrhea. He denied having any significant chest pain, shortness of breath, cough or hemoptysis. The patient denied having any fever or chills, no vomiting, abdominal pain, diarrhea or constipation. He denied having any significant weight loss or night sweats. He had repeat bloodwork performed earlier today and is here for evaluation and discussion of his lab results.  MEDICAL HISTORY: Past Medical History  Diagnosis Date  . Hypertension   . Hypercholesterolemia   . Sinus disease     hx of  . History of pneumonia  last 3-4 yrs ago    3 -4 different times  .  Hypothyroidism   . Sleep apnea     uses cpap setting opf 12  . History of agent Orange exposure 44-45 yrs ago  . Complication of anesthesia 2007    quit breathing with bronchoscopy, had to spend night  . lung ca dx'd 2007    lung right  . Cholelithiases 07/16/2015    ALLERGIES:  is allergic to lisinopril.  MEDICATIONS:  Current Outpatient Prescriptions  Medication Sig Dispense Refill  . amitriptyline (ELAVIL) 50 MG tablet Take 50 mg by mouth at bedtime. Reported on 06/18/2015    . aspirin 81 MG tablet Take 81 mg by mouth every morning.     . Cholecalciferol 2000 units CAPS Take 1 capsule by mouth daily.    . clindamycin (CLEOCIN-T) 1 % external solution Apply topically 2 (two) times daily. 240 mL 2  . clindamycin (CLINDAGEL) 1 % gel Apply 1 application topically 2 (two) times daily.    [name] diltiazem (CARDIZEM CD) 360 MG 24 hr capsule Take 360 mg by mouth every morning.     . erlotinib (TARCEVA) 100 MG tablet Take 1 tablet (100 mg total) by mouth daily. Take on an empty stomach 1 hour before meals or 2 hours after.    . levETIRAcetam (KEPPRA) 750 MG tablet     . levothyroxine (SYNTHROID, LEVOTHROID) 88 MCG tablet Take 88 mcg by mouth daily before breakfast.    . metoCLOPramide (REGLAN) 10 MG tablet Take 10 mg by mouth every 6 (six) hours as needed for nausea.     . metoprolol succinate (TOPROL-XL) 25 MG 24  hr tablet Take 25 mg by mouth every morning.     . Multiple Vitamins-Minerals (CENTRUM SILVER PO) Take 1 tablet by mouth daily.     . rosuvastatin (CRESTOR) 5 MG tablet Take 5 mg by mouth every morning.     . valsartan-hydrochlorothiazide (DIOVAN-HCT) 320-25 MG per tablet Take 1 tablet by mouth every morning.     . naproxen sodium (ANAPROX) 550 MG tablet Take 550 mg by mouth 3 (three) times daily as needed for moderate pain. Reported on 08/27/2015    . nitroGLYCERIN (NITROSTAT) 0.4 MG SL tablet Place 0.4 mg under the tongue every 5 (five) minutes as needed for chest pain. Reported on  08/27/2015    . polyethylene glycol (MIRALAX / GLYCOLAX) packet Take 17 g by mouth daily. Reported on 08/27/2015    . psyllium (METAMUCIL) 58.6 % powder Take 1 packet by mouth 3 (three) times daily. Reported on 08/27/2015     No current facility-administered medications for this visit.    SURGICAL HISTORY:  Past Surgical History  Procedure Laterality Date  . Cystourethroscopy, gyrus turp.  04/16/2010  . Torn medial and lateral menisci, left knee.  11/06/2006  . Right video-assisted thoracoscopy, right upper lobectomy and  01/23/2006  . The olympus video bronchoscope was introduced via the right  12/21/2005  . Nasal sinus surgery  1987  . Tonsillectomy  1957  . Colonoscopy with propofol N/A 03/19/2013    Procedure: COLONOSCOPY WITH PROPOFOL;  Surgeon: Martin K Johnson, MD;  Location: [facility];  Service: Endoscopy;  Laterality: N/A;    REVIEW OF SYSTEMS:  A comprehensive review of systems was negative.   PHYSICAL EXAMINATION: General appearance: alert, cooperative and no distress Head: Normocephalic, without obvious abnormality, atraumatic Neck: no adenopathy Lymph nodes: Cervical, supraclavicular, and axillary nodes normal. Resp: clear to auscultation bilaterally Back: symmetric, no curvature. ROM normal. No CVA tenderness. Cardio: regular rate and rhythm, S1, S2 normal, no murmur, click, rub or gallop GI: soft, non-tender; bowel sounds normal; no masses,  no organomegaly Extremities: extremities normal, atraumatic, no cyanosis or edema Neurologic: Alert and oriented X 3, normal strength and tone. Normal symmetric reflexes. Normal coordination and gait    ECOG PERFORMANCE STATUS: 0 - Asymptomatic  Blood pressure 128/69, pulse 63, temperature 98 F (36.7 C), resp. rate 18, height 6' (1.829 m), weight 240 lb 14.4 oz (109.272 kg), SpO2 100 %.  LABORATORY DATA: Lab Results  Component Value Date   WBC 5.3 08/27/2015   HGB 13.3 08/27/2015   HCT 39.0 08/27/2015   MCV 98.2*  08/27/2015   PLT 232 08/27/2015      Chemistry      Component Value Date/Time   NA 141 08/13/2015 0833   NA 138 07/17/2014 1000   NA 139 10/20/2011 0903   K 3.7 08/13/2015 0833   K 3.3* 07/17/2014 1000   K 3.4 10/20/2011 0903   CL 107 07/17/2014 1000   CL 102 04/24/2012 0853   CL 101 10/20/2011 0903   CO2 28 08/13/2015 0833   CO2 26 07/17/2014 1000   CO2 29 10/20/2011 0903   BUN 15.4 08/13/2015 0833   BUN 16 07/17/2014 1000   BUN 12 10/20/2011 0903   CREATININE 1.1 08/13/2015 0833   CREATININE 0.82 07/17/2014 1000   CREATININE 0.9 10/20/2011 0903      Component Value Date/Time   CALCIUM 9.4 08/13/2015 0833   CALCIUM 8.9 07/17/2014 1000   CALCIUM 9.0 10/20/2011 0903   ALKPHOS 57 08/13/2015 0833   ALKPHOS   53 07/17/2014 1000   ALKPHOS 52 10/20/2011 0903   AST 35* 08/13/2015 0833   AST 24 07/17/2014 1000   AST 24 10/20/2011 0903   ALT 33 08/13/2015 0833   ALT 28 07/17/2014 1000   ALT 31 10/20/2011 0903   BILITOT 0.41 08/13/2015 0833   BILITOT 0.8 07/17/2014 1000   BILITOT 0.60 10/20/2011 0903       RADIOGRAPHIC STUDIES: No results found. ASSESSMENT AND PLAN: This is a very pleasant 69 years old white male with recurrent non-small cell lung cancer, adenocarcinoma with positive EGFR mutation in exon 21 that was initially diagnosed as stage IA non-small cell lung cancer status post right upper lobectomy and has been observation since October of 2007. He is status post 6 weeks treatment with Tarceva 150 mg by mouth daily according to the BMS checkmate 370 clinical trial. He is currently on Tarceva 100 mg by mouth daily Status post 13 months and and tolerating it fairly well with no significant skin rash, diarrhea. I recommended for the patient to continue his current treatment with Tarceva at the same dose. He would come back for follow-up visit in 2 weeks for reevaluation with repeat blood work as well as CT scan of the chest, abdomen and pelvis for restaging of his  disease. The patient was advised to call immediately if he has any other concerning symptoms in the interval.  All questions were answered. The patient knows to call the clinic with any problems, questions or concerns. We can certainly see the patient much sooner if necessary.  Disclaimer: This note was dictated with voice recognition software. Similar sounding words can inadvertently be transcribed and may not be corrected upon review.        

## 2015-08-27 NOTE — Telephone Encounter (Signed)
per pof to sch pt appt-gave pt copy of avs °

## 2015-09-03 ENCOUNTER — Ambulatory Visit (HOSPITAL_COMMUNITY)
Admission: RE | Admit: 2015-09-03 | Discharge: 2015-09-03 | Disposition: A | Discharge status: Home / Self Care | Payer: Medicare Other | Source: Ambulatory Visit | Attending: Internal Medicine | Admitting: Internal Medicine

## 2015-09-03 ENCOUNTER — Encounter (HOSPITAL_COMMUNITY): Payer: Self-pay

## 2015-09-03 DIAGNOSIS — M47816 Spondylosis without myelopathy or radiculopathy, lumbar region: Secondary | ICD-10-CM | POA: Diagnosis not present

## 2015-09-03 DIAGNOSIS — I700 Atherosclerosis of aorta: Secondary | ICD-10-CM | POA: Insufficient documentation

## 2015-09-03 DIAGNOSIS — C3431 Malignant neoplasm of lower lobe, right bronchus or lung: Secondary | ICD-10-CM | POA: Insufficient documentation

## 2015-09-03 DIAGNOSIS — K8020 Calculus of gallbladder without cholecystitis without obstruction: Secondary | ICD-10-CM | POA: Diagnosis not present

## 2015-09-03 DIAGNOSIS — C3491 Malignant neoplasm of unspecified part of right bronchus or lung: Secondary | ICD-10-CM | POA: Diagnosis not present

## 2015-09-03 DIAGNOSIS — M5136 Other intervertebral disc degeneration, lumbar region: Secondary | ICD-10-CM | POA: Insufficient documentation

## 2015-09-03 DIAGNOSIS — Z5111 Encounter for antineoplastic chemotherapy: Secondary | ICD-10-CM

## 2015-09-03 MED ORDER — IOPAMIDOL (ISOVUE-300) INJECTION 61%
100.0000 mL | Freq: Once | INTRAVENOUS | Status: AC | PRN
  Administered 2015-09-03: 100 mL via INTRAVENOUS

## 2015-09-09 ENCOUNTER — Encounter: Payer: Self-pay | Admitting: Internal Medicine

## 2015-09-09 ENCOUNTER — Encounter: Payer: Self-pay | Admitting: *Deleted

## 2015-09-09 DIAGNOSIS — Z79899 Other long term (current) drug therapy: Secondary | ICD-10-CM

## 2015-09-09 DIAGNOSIS — C3431 Malignant neoplasm of lower lobe, right bronchus or lung: Secondary | ICD-10-CM

## 2015-09-10 ENCOUNTER — Other Ambulatory Visit (HOSPITAL_COMMUNITY)
Admission: RE | Admit: 2015-09-10 | Discharge: 2015-09-10 | Disposition: A | Discharge status: Home / Self Care | Payer: Medicare Other | Source: Ambulatory Visit | Attending: Internal Medicine | Admitting: Internal Medicine

## 2015-09-10 ENCOUNTER — Encounter: Payer: Self-pay | Admitting: Internal Medicine

## 2015-09-10 ENCOUNTER — Encounter: Payer: Self-pay | Admitting: Oncology

## 2015-09-10 ENCOUNTER — Ambulatory Visit (HOSPITAL_BASED_OUTPATIENT_CLINIC_OR_DEPARTMENT_OTHER): Payer: Medicare Other | Admitting: Internal Medicine

## 2015-09-10 ENCOUNTER — Other Ambulatory Visit (HOSPITAL_BASED_OUTPATIENT_CLINIC_OR_DEPARTMENT_OTHER): Payer: Medicare Other

## 2015-09-10 VITALS — BP 119/72 | HR 69 | Temp 98.7°F | Resp 18 | Ht 72.0 in | Wt 238.8 lb

## 2015-09-10 DIAGNOSIS — C3491 Malignant neoplasm of unspecified part of right bronchus or lung: Secondary | ICD-10-CM

## 2015-09-10 DIAGNOSIS — Z5111 Encounter for antineoplastic chemotherapy: Secondary | ICD-10-CM

## 2015-09-10 DIAGNOSIS — C3431 Malignant neoplasm of lower lobe, right bronchus or lung: Secondary | ICD-10-CM

## 2015-09-10 DIAGNOSIS — Z006 Encounter for examination for normal comparison and control in clinical research program: Secondary | ICD-10-CM

## 2015-09-10 DIAGNOSIS — Z79899 Other long term (current) drug therapy: Secondary | ICD-10-CM

## 2015-09-10 LAB — CBC WITH DIFFERENTIAL/PLATELET
BASO%: 0.5 % (ref 0.0–2.0)
Basophils Absolute: 0 10*3/uL (ref 0.0–0.1)
EOS ABS: 0.2 10*3/uL (ref 0.0–0.5)
EOS%: 3.7 % (ref 0.0–7.0)
HEMATOCRIT: 38.6 % (ref 38.4–49.9)
HEMOGLOBIN: 13.1 g/dL (ref 13.0–17.1)
LYMPH#: 2.3 10*3/uL (ref 0.9–3.3)
LYMPH%: 39 % (ref 14.0–49.0)
MCH: 33.3 pg (ref 27.2–33.4)
MCHC: 33.9 g/dL (ref 32.0–36.0)
MCV: 98.2 fL — AB (ref 79.3–98.0)
MONO#: 0.7 10*3/uL (ref 0.1–0.9)
MONO%: 12.2 % (ref 0.0–14.0)
NEUT%: 44.6 % (ref 39.0–75.0)
NEUTROS ABS: 2.7 10*3/uL (ref 1.5–6.5)
Platelets: 214 10*3/uL (ref 140–400)
RBC: 3.93 10*6/uL — ABNORMAL LOW (ref 4.20–5.82)
RDW: 12.8 % (ref 11.0–14.6)
WBC: 6 10*3/uL (ref 4.0–10.3)

## 2015-09-10 LAB — COMPREHENSIVE METABOLIC PANEL
ALBUMIN: 4 g/dL (ref 3.5–5.0)
ALK PHOS: 51 U/L (ref 40–150)
ALT: 36 U/L (ref 0–55)
AST: 37 U/L — AB (ref 5–34)
Anion Gap: 9 mEq/L (ref 3–11)
BILIRUBIN TOTAL: 0.53 mg/dL (ref 0.20–1.20)
BUN: 15.2 mg/dL (ref 7.0–26.0)
CO2: 28 mEq/L (ref 22–29)
CREATININE: 1.1 mg/dL (ref 0.7–1.3)
Calcium: 9.5 mg/dL (ref 8.4–10.4)
Chloride: 104 mEq/L (ref 98–109)
EGFR: 68 mL/min/{1.73_m2} — ABNORMAL LOW (ref >90)
GLUCOSE: 101 mg/dL (ref 70–140)
Potassium: 3.8 mEq/L (ref 3.5–5.1)
SODIUM: 140 meq/L (ref 136–145)
TOTAL PROTEIN: 6.8 g/dL (ref 6.4–8.3)

## 2015-09-10 LAB — LACTATE DEHYDROGENASE: LDH: 201 U/L (ref 125–245)

## 2015-09-10 LAB — PHOSPHORUS: Phosphorus: 3.1 mg/dL (ref 2.5–4.6)

## 2015-09-10 LAB — TSH: TSH: 3.397 m(IU)/L (ref 0.320–4.118)

## 2015-09-10 LAB — MAGNESIUM: Magnesium: 2.3 mg/dl (ref 1.5–2.5)

## 2015-09-10 NOTE — Progress Notes (Signed)
[facility] Telephone:([phone]) [phone]   Fax:(336) [phone]  OFFICE PROGRESS NOTE  [name],[name], MD 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 Roxan Hockey on 01/23/2006.  2) Tarceva 150 mg by mouth daily as part of the BMS checkmate 370 clinical trial. Status post 6 weeks of treatment.   CURRENT THERAPY: Tarceva 100 mg by mouth daily as part of the BMS checkmate 370 clinical trial. Started 07/17/2014 Status post 14 months of treatment.  DISEASE STAGE: Recurrent  CHEMOTHERAPY INTENT: Palliative.  CURRENT # OF CHEMOTHERAPY CYCLES: 15  CURRENT ANTIEMETICS: None  CURRENT SMOKING STATUS: Non-smoker  ORAL CHEMOTHERAPY AND CONSENT: None  CURRENT BISPHOSPHONATES USE: None  LIVING WILL AND CODE STATUS: Full code.  INTERVAL HISTORY: [name] 70 y.o. male returns to the clinic today for followup visit accompanied by his wife. The patient is doing fine with no complaints today. He is tolerating his treatment with Tarceva fairly well. He denied having any skin rash or diarrhea. He denied having any significant chest pain, shortness of breath, cough or hemoptysis. The patient denied having any fever or chills, no vomiting, abdominal pain, diarrhea or constipation. He denied having any significant weight loss or night sweats. He had CT scan of the chest, abdomen and pelvis performed recently and is here for evaluation and discussion of his lab results.  MEDICAL HISTORY: Past Medical History  Diagnosis Date  . Hypertension   . Hypercholesterolemia   . Sinus disease     hx of  . History of pneumonia  last 3-4 yrs ago    3 -4 different  times  . Hypothyroidism   . Sleep apnea     uses cpap setting opf 12  . History of agent Orange exposure 44-45 yrs ago  . Complication of anesthesia 2007    quit breathing with bronchoscopy, had to spend night  . Cholelithiases 07/16/2015  . lung ca dx'd 2007    lung right    ALLERGIES:  is allergic to lisinopril.  MEDICATIONS:  Current Outpatient Prescriptions  Medication Sig Dispense Refill  . amitriptyline (ELAVIL) 50 MG tablet Take 50 mg by mouth at bedtime. Reported on 06/18/2015    . aspirin 81 MG tablet Take 81 mg by mouth every morning.     . Cholecalciferol 2000 units CAPS Take 1 capsule by mouth daily.    . clindamycin (CLEOCIN-T) 1 % external solution Apply topically 2 (two) times daily. 240 mL 2  . clindamycin (CLINDAGEL) 1 % gel Apply 1 application topically 2 (two) times daily.    [name] diltiazem (CARDIZEM CD) 360 MG 24 hr capsule Take 360 mg by mouth every morning.     . erlotinib (TARCEVA) 100 MG tablet Take 1 tablet (100 mg total) by mouth daily. Take on an empty stomach 1 hour before meals or 2 hours after.    . levETIRAcetam (KEPPRA) 750 MG tablet     . levothyroxine (SYNTHROID, LEVOTHROID) 88 MCG tablet Take 88 mcg by mouth daily before breakfast.    . metoCLOPramide (REGLAN) 10 MG tablet Take 10 mg by mouth every 6 (six) hours as needed for nausea.     . metoprolol succinate (TOPROL-XL)  25 MG 24 hr tablet Take 25 mg by mouth every morning.     . Multiple Vitamins-Minerals (CENTRUM SILVER PO) Take 1 tablet by mouth daily.     . naproxen sodium (ANAPROX) 550 MG tablet Take 550 mg by mouth 3 (three) times daily as needed for moderate pain. Reported on 08/27/2015    . nitroGLYCERIN (NITROSTAT) 0.4 MG SL tablet Place 0.4 mg under the tongue every 5 (five) minutes as needed for chest pain. Reported on 08/27/2015    . polyethylene glycol (MIRALAX / GLYCOLAX) packet Take 17 g by mouth daily. Reported on 08/27/2015    . psyllium (METAMUCIL) 58.6 % powder Take 1 packet by mouth 3  (three) times daily. Reported on 08/27/2015    . rosuvastatin (CRESTOR) 5 MG tablet Take 5 mg by mouth every morning.     . valsartan-hydrochlorothiazide (DIOVAN-HCT) 320-25 MG per tablet Take 1 tablet by mouth every morning.      No current facility-administered medications for this visit.    SURGICAL HISTORY:  Past Surgical History  Procedure Laterality Date  . Cystourethroscopy, gyrus turp.  04/16/2010  . Torn medial and lateral menisci, left knee.  11/06/2006  . Right video-assisted thoracoscopy, right upper lobectomy and  01/23/2006  . The olympus video bronchoscope was introduced via the right  12/21/2005  . Nasal sinus surgery  1987  . Tonsillectomy  1957  . Colonoscopy with propofol N/A 03/19/2013    Procedure: COLONOSCOPY WITH PROPOFOL;  Surgeon: Garlan Fair, MD;  Location: [facility];  Service: Endoscopy;  Laterality: N/A;    REVIEW OF SYSTEMS:  Constitutional: negative Eyes: negative Ears, nose, mouth, throat, and face: negative Respiratory: negative Cardiovascular: negative Gastrointestinal: negative Genitourinary:negative Integument/breast: negative Hematologic/lymphatic: negative Musculoskeletal:negative Neurological: negative Behavioral/Psych: negative Endocrine: negative Allergic/Immunologic: negative   PHYSICAL EXAMINATION: General appearance: alert, cooperative and no distress Head: Normocephalic, without obvious abnormality, atraumatic Neck: no adenopathy Lymph nodes: Cervical, supraclavicular, and axillary nodes normal. Resp: clear to auscultation bilaterally Back: symmetric, no curvature. ROM normal. No CVA tenderness. Cardio: regular rate and rhythm, S1, S2 normal, no murmur, click, rub or gallop GI: soft, non-tender; bowel sounds normal; no masses,  no organomegaly Extremities: extremities normal, atraumatic, no cyanosis or edema Neurologic: Alert and oriented X 3, normal strength and tone. Normal symmetric reflexes. Normal coordination and  gait    ECOG PERFORMANCE STATUS: 0 - Asymptomatic  Blood pressure 119/72, pulse 69, temperature 98.7 F (37.1 C), temperature source Oral, resp. rate 18, height 6' (1.829 m), weight 238 lb 12.8 oz (108.319 kg), SpO2 99 %.  LABORATORY DATA: Lab Results  Component Value Date   WBC 6.0 09/10/2015   HGB 13.1 09/10/2015   HCT 38.6 09/10/2015   MCV 98.2* 09/10/2015   PLT 214 09/10/2015      Chemistry      Component Value Date/Time   NA 141 08/27/2015 1135   NA 138 07/17/2014 1000   NA 139 10/20/2011 0903   K 4.0 08/27/2015 1135   K 3.3* 07/17/2014 1000   K 3.4 10/20/2011 0903   CL 107 07/17/2014 1000   CL 102 04/24/2012 0853   CL 101 10/20/2011 0903   CO2 30* 08/27/2015 1135   CO2 26 07/17/2014 1000   CO2 29 10/20/2011 0903   BUN 16.4 08/27/2015 1135   BUN 16 07/17/2014 1000   BUN 12 10/20/2011 0903   CREATININE 1.1 08/27/2015 1135   CREATININE 0.82 07/17/2014 1000   CREATININE 0.9 10/20/2011 0903  Component Value Date/Time   CALCIUM 9.9 08/27/2015 1135   CALCIUM 8.9 07/17/2014 1000   CALCIUM 9.0 10/20/2011 0903   ALKPHOS 57 08/27/2015 1135   ALKPHOS 53 07/17/2014 1000   ALKPHOS 52 10/20/2011 0903   AST 28 08/27/2015 1135   AST 24 07/17/2014 1000   AST 24 10/20/2011 0903   ALT 32 08/27/2015 1135   ALT 28 07/17/2014 1000   ALT 31 10/20/2011 0903   BILITOT 0.49 08/27/2015 1135   BILITOT 0.8 07/17/2014 1000   BILITOT 0.60 10/20/2011 7619       RADIOGRAPHIC STUDIES: Ct Chest W Contrast  09/03/2015  CLINICAL DATA:  Non-small cell right-sided lung cancer diagnosed in 2007, with right upper lobectomy but subsequent recurrence in the right lung in 2016, undergoing oral chemotherapy. EXAM: CT CHEST, ABDOMEN, AND PELVIS WITH CONTRAST RECIST 1.1 ASSESSMENT TECHNIQUE: Multidetector CT imaging of the chest, abdomen and pelvis was performed following the standard protocol during bolus administration of intravenous contrast. CONTRAST:  122m ISOVUE-300 IOPAMIDOL  (ISOVUE-300) INJECTION 61% COMPARISON:  07/09/2015 FINDINGS: RECIST 1.1 Target Lesions: 1. Right lower lobe nodule on image 58/5: 1.7 cm in long axis, unchanged 2. Right lower lobe lung lesion on image 62/5:  1.3 cm in long axis Non-target Lesions: 1. Sub solid nodule anteriorly in the right lower lobe on image 64/5: 0.5 cm in long axis 2. Posterior right lower lobe nodule on image 75/5: 1.1 cm in long axis. CT CHEST FINDINGS Mediastinum/Nodes: Right mediastinal shift due to right hemithoracic volume loss. No pathologic adenopathy. Lungs/Pleura: Right upper lobectomy. Stably diminished volume in the residual right middle lobe. Bandlike scarring in the right lower lobe is for the most part stable, with a 2.0 by 1.3 cm component on image 58 series 5 which previously measured the same by my measurement and also have the same morphology. Scar-like density with nodular components posteriorly in the right lung are observed. On image 61 series 5 a nodular component of this apparent scarring measures 0.8 by 0.6 cm, formerly the same. A lower nodular component on image 75/5 measures 1.1 by 0.6 cm, formerly measured at 0.8 by 0.5 cm. A ground-glass density nodule anteriorly in the right lower lobe on image 64 series 5 is slightly less confluent and measures 5 mm in long axis, previously 6 mm. The no new nodules. There is biapical pleural parenchymal scarring. No left-sided nodules are identified. Musculoskeletal: Unremarkable CT ABDOMEN PELVIS FINDINGS Hepatobiliary: 2.9 cm in long axis gallstone, image 56/2. Pancreas: Unremarkable Spleen: Unremarkable Adrenals/Urinary Tract: Adrenal glands remain normal. Exophytic 1.4 cm cyst from the right kidney upper pole. Stomach/Bowel: Prominent stool throughout the colon favors constipation. Vascular/Lymphatic: Aortoiliac atherosclerotic vascular disease. No pathologic abdominal adenopathy. Reproductive: Unremarkable Other: No supplemental non-categorized findings. Musculoskeletal:  Lumbar spondylosis and degenerative disc disease causing impingement at L3-4, L4-5, and L5-S 1. Grade 1 degenerative retrolisthesis at L3-4 and L4-5. Degenerative loss of articular space in both hips. Left indirect inguinal hernia contains adipose tissue. IMPRESSION: 1. Generally similar appearance of the nodularity along areas of scarring in the right lower lobe. One of the areas of nodularity is minimally larger than previous, and merit surveillance, although probably this changes incidental. No new nodules. 2. Cholelithiasis. 3. Lumbar spondylosis and degenerative disc disease cause impingement at L3-4, L4-5, and L5-S 1. 4.  Prominent stool throughout the colon favors constipation. 5.  Aortoiliac atherosclerotic vascular disease. Electronically Signed   By: WVan ClinesM.D.   On: 09/03/2015 10:42   Ct Abdomen Pelvis W  Contrast  09/03/2015  CLINICAL DATA:  Non-small cell right-sided lung cancer diagnosed in 2007, with right upper lobectomy but subsequent recurrence in the right lung in 2016, undergoing oral chemotherapy. EXAM: CT CHEST, ABDOMEN, AND PELVIS WITH CONTRAST RECIST 1.1 ASSESSMENT TECHNIQUE: Multidetector CT imaging of the chest, abdomen and pelvis was performed following the standard protocol during bolus administration of intravenous contrast. CONTRAST:  164m ISOVUE-300 IOPAMIDOL (ISOVUE-300) INJECTION 61% COMPARISON:  07/09/2015 FINDINGS: RECIST 1.1 Target Lesions: 1. Right lower lobe nodule on image 58/5: 1.7 cm in long axis, unchanged 2. Right lower lobe lung lesion on image 62/5:  1.3 cm in long axis Non-target Lesions: 1. Sub solid nodule anteriorly in the right lower lobe on image 64/5: 0.5 cm in long axis 2. Posterior right lower lobe nodule on image 75/5: 1.1 cm in long axis. CT CHEST FINDINGS Mediastinum/Nodes: Right mediastinal shift due to right hemithoracic volume loss. No pathologic adenopathy. Lungs/Pleura: Right upper lobectomy. Stably diminished volume in the residual right  middle lobe. Bandlike scarring in the right lower lobe is for the most part stable, with a 2.0 by 1.3 cm component on image 58 series 5 which previously measured the same by my measurement and also have the same morphology. Scar-like density with nodular components posteriorly in the right lung are observed. On image 61 series 5 a nodular component of this apparent scarring measures 0.8 by 0.6 cm, formerly the same. A lower nodular component on image 75/5 measures 1.1 by 0.6 cm, formerly measured at 0.8 by 0.5 cm. A ground-glass density nodule anteriorly in the right lower lobe on image 64 series 5 is slightly less confluent and measures 5 mm in long axis, previously 6 mm. The no new nodules. There is biapical pleural parenchymal scarring. No left-sided nodules are identified. Musculoskeletal: Unremarkable CT ABDOMEN PELVIS FINDINGS Hepatobiliary: 2.9 cm in long axis gallstone, image 56/2. Pancreas: Unremarkable Spleen: Unremarkable Adrenals/Urinary Tract: Adrenal glands remain normal. Exophytic 1.4 cm cyst from the right kidney upper pole. Stomach/Bowel: Prominent stool throughout the colon favors constipation. Vascular/Lymphatic: Aortoiliac atherosclerotic vascular disease. No pathologic abdominal adenopathy. Reproductive: Unremarkable Other: No supplemental non-categorized findings. Musculoskeletal: Lumbar spondylosis and degenerative disc disease causing impingement at L3-4, L4-5, and L5-S 1. Grade 1 degenerative retrolisthesis at L3-4 and L4-5. Degenerative loss of articular space in both hips. Left indirect inguinal hernia contains adipose tissue. IMPRESSION: 1. Generally similar appearance of the nodularity along areas of scarring in the right lower lobe. One of the areas of nodularity is minimally larger than previous, and merit surveillance, although probably this changes incidental. No new nodules. 2. Cholelithiasis. 3. Lumbar spondylosis and degenerative disc disease cause impingement at L3-4, L4-5, and  L5-S 1. 4.  Prominent stool throughout the colon favors constipation. 5.  Aortoiliac atherosclerotic vascular disease. Electronically Signed   By: WVan ClinesM.D.   On: 09/03/2015 10:42   ASSESSMENT AND PLAN: This is a very pleasant 70years old white male with recurrent non-small cell lung cancer, adenocarcinoma with positive EGFR mutation in exon 21 that was initially diagnosed as stage IA non-small cell lung cancer status post right upper lobectomy and has been observation since October of 2007. He is status post 6 weeks treatment with Tarceva 150 mg by mouth daily according to the BMS checkmate 370 clinical trial. He is currently on Tarceva 100 mg by mouth daily Status post 14 months and and tolerating it fairly well with no significant adverse effects. Recent CT scan of the chest, abdomen and pelvis showed  stable disease. I discussed the scan results and showed the images to the patient and his wife. I recommended for the patient to continue his current treatment with Tarceva at the same dose. He would come back for follow-up visit in 2 weeks for reevaluation with repeat blood work. The patient was advised to call immediately if he has any other concerning symptoms in the interval.  All questions were answered. The patient knows to call the clinic with any problems, questions or concerns. We can certainly see the patient much sooner if necessary.  Disclaimer: This note was dictated with voice recognition software. Similar sounding words can inadvertently be transcribed and may not be corrected upon review.

## 2015-09-10 NOTE — Progress Notes (Signed)
09/10/15 - BMS [id number] - Questionnaires (PRO's) - Patient into the cancer center for routine visit.  Patient given PRO's upon arrival to the cancer center.  Patient completed the PRO's ([id number] first and then the FACT-L).  I checked the PRO's for completeness.  The patient was thanked for his continued support in this clinical trial. [name] 09/10/15 - 8:45 am

## 2015-09-10 NOTE — Progress Notes (Signed)
09/10/2015 0915 The patient returns to the clinic accompanied by his wife.  He reports no changes in his condition since his last visit.  He reports walking every day the past 2 weeks since the weather has been nice.  He reports no changes with his breathing and has dyspnea only with exertion. He denies diarrhea or worsening skin rash.  He continues with the right first toe fungus (unchanged).  He reports no new side effects today. The patient had a CT scan on 09/03/15.  Scan results and RECIST table worksheet were reviewed and checked by V. Sheidler, Geophysicist/field seismologist, on 09/07/15.  She was in agreement with RECIST worksheet. Dr. [name] was in to see the patient and performed a targeted physical exam.  He reviewed the lab results.  Dr. [name] reviewed the CT results with the patient.  Per Dr. [name], "the patient has stable disease".  Dr. [name] reviewed the RECIST table and stated it was correct.  He confirmed the RLL ground glass nodule (non-target) nature is uncertain, despite the fact it is 1.1 cm compared to 0.8 cm on 07/09/15.  Dr. [name] said the patient still has stable disease and to continue with current treatment (erlotinib). The patient was without questions and knows to call for any problems. Marcellus Scott, RN, BSN, MHA, OCN

## 2015-09-23 ENCOUNTER — Encounter: Payer: Self-pay | Admitting: *Deleted

## 2015-09-23 ENCOUNTER — Encounter: Payer: Self-pay | Admitting: Internal Medicine

## 2015-09-23 DIAGNOSIS — C3431 Malignant neoplasm of lower lobe, right bronchus or lung: Secondary | ICD-10-CM

## 2015-09-23 MED ORDER — ERLOTINIB HCL 100 MG PO TABS: 100.0000 mg | ORAL_TABLET | Freq: Every day | ORAL | Status: DC

## 2015-09-24 ENCOUNTER — Ambulatory Visit (HOSPITAL_BASED_OUTPATIENT_CLINIC_OR_DEPARTMENT_OTHER): Payer: Medicare Other | Admitting: Internal Medicine

## 2015-09-24 ENCOUNTER — Telehealth: Payer: Self-pay | Admitting: Internal Medicine

## 2015-09-24 ENCOUNTER — Other Ambulatory Visit (HOSPITAL_BASED_OUTPATIENT_CLINIC_OR_DEPARTMENT_OTHER): Payer: Medicare Other

## 2015-09-24 ENCOUNTER — Encounter: Payer: Self-pay | Admitting: Internal Medicine

## 2015-09-24 ENCOUNTER — Other Ambulatory Visit (HOSPITAL_COMMUNITY)
Admission: RE | Admit: 2015-09-24 | Discharge: 2015-09-24 | Disposition: A | Discharge status: Home / Self Care | Payer: Medicare Other | Source: Ambulatory Visit | Attending: Internal Medicine | Admitting: Internal Medicine

## 2015-09-24 VITALS — BP 117/67 | HR 65 | Temp 97.6°F | Resp 18 | Ht 72.0 in | Wt 240.9 lb

## 2015-09-24 DIAGNOSIS — C3431 Malignant neoplasm of lower lobe, right bronchus or lung: Secondary | ICD-10-CM | POA: Insufficient documentation

## 2015-09-24 DIAGNOSIS — Z006 Encounter for examination for normal comparison and control in clinical research program: Secondary | ICD-10-CM

## 2015-09-24 DIAGNOSIS — Z5111 Encounter for antineoplastic chemotherapy: Secondary | ICD-10-CM

## 2015-09-24 DIAGNOSIS — C3491 Malignant neoplasm of unspecified part of right bronchus or lung: Secondary | ICD-10-CM

## 2015-09-24 LAB — CBC WITH DIFFERENTIAL/PLATELET
BASO%: 1 % (ref 0.0–2.0)
BASOS ABS: 0 10*3/uL (ref 0.0–0.1)
EOS ABS: 0.2 10*3/uL (ref 0.0–0.5)
EOS%: 3.8 % (ref 0.0–7.0)
HEMATOCRIT: 38.1 % — AB (ref 38.4–49.9)
HEMOGLOBIN: 12.7 g/dL — AB (ref 13.0–17.1)
LYMPH#: 1.8 10*3/uL (ref 0.9–3.3)
LYMPH%: 37.7 % (ref 14.0–49.0)
MCH: 32.9 pg (ref 27.2–33.4)
MCHC: 33.4 g/dL (ref 32.0–36.0)
MCV: 98.6 fL — AB (ref 79.3–98.0)
MONO#: 0.5 10*3/uL (ref 0.1–0.9)
MONO%: 11.6 % (ref 0.0–14.0)
NEUT%: 45.9 % (ref 39.0–75.0)
NEUTROS ABS: 2.1 10*3/uL (ref 1.5–6.5)
Platelets: 201 10*3/uL (ref 140–400)
RBC: 3.86 10*6/uL — ABNORMAL LOW (ref 4.20–5.82)
RDW: 13.1 % (ref 11.0–14.6)
WBC: 4.6 10*3/uL (ref 4.0–10.3)

## 2015-09-24 LAB — COMPREHENSIVE METABOLIC PANEL
ALBUMIN: 3.8 g/dL (ref 3.5–5.0)
ALK PHOS: 53 U/L (ref 40–150)
ALT: 40 U/L (ref 0–55)
AST: 34 U/L (ref 5–34)
Anion Gap: 9 mEq/L (ref 3–11)
BILIRUBIN TOTAL: 0.42 mg/dL (ref 0.20–1.20)
BUN: 15.8 mg/dL (ref 7.0–26.0)
CALCIUM: 9.1 mg/dL (ref 8.4–10.4)
CO2: 26 mEq/L (ref 22–29)
Chloride: 105 mEq/L (ref 98–109)
Creatinine: 1 mg/dL (ref 0.7–1.3)
EGFR: 74 mL/min/{1.73_m2} — AB (ref >90)
GLUCOSE: 107 mg/dL (ref 70–140)
POTASSIUM: 3.7 meq/L (ref 3.5–5.1)
Sodium: 140 mEq/L (ref 136–145)
TOTAL PROTEIN: 6.5 g/dL (ref 6.4–8.3)

## 2015-09-24 LAB — MAGNESIUM: Magnesium: 2.3 mg/dl (ref 1.5–2.5)

## 2015-09-24 LAB — PHOSPHORUS: PHOSPHORUS: 2.9 mg/dL (ref 2.5–4.6)

## 2015-09-24 LAB — LACTATE DEHYDROGENASE: LDH: 199 U/L (ref 125–245)

## 2015-09-24 MED ORDER — ERLOTINIB HCL 100 MG PO TABS: 100.0000 mg | ORAL_TABLET | Freq: Every day | ORAL | Status: DC

## 2015-09-24 MED FILL — TARCEVA 100 MG TABLET: 100 | 30 days supply | Qty: 30 | Fill #0

## 2015-09-24 NOTE — Telephone Encounter (Signed)
Gave cal & avs

## 2015-09-24 NOTE — Progress Notes (Signed)
09/24/2015 0930 BMS 370 Group D , Arm A The patient returns with his wife to the clinic.  He denies having any changes since his last visit 2 weeks ago.  He reports having not missed taking any of the erlotinib as instructed.  He denies taking any new medications. He denies diarrhea, changes in skin, eye problems, or pain.  His rash remains the same.  He has intermittent SOB.  He has been walking every day and has a good appetite.  His weight remains stable. Dr. [name] was in to perform a targeted physical exam.  Based on lab results review, VS, and PE, the patient was cleared by MD to continue with treatment. The patient and his wife were reminded to call for any changes or concerns. Marcellus Scott, RN, BSN, MHA, OCN

## 2015-09-24 NOTE — Progress Notes (Signed)
[facility] [phone]   [phone]  OFFICE PROGRESS NOTE  [name],[name], MD 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 Roxan Hockey on 01/23/2006.  2) Tarceva 150 mg by mouth daily as part of the BMS checkmate 370 clinical trial. Status post 6 weeks of treatment.   CURRENT THERAPY: Tarceva 100 mg by mouth daily as part of the BMS checkmate 370 clinical trial. Started 07/17/2014 Status post 14 months of treatment.  DISEASE STAGE: Recurrent  CHEMOTHERAPY INTENT: Palliative.  CURRENT # OF CHEMOTHERAPY CYCLES: 15  CURRENT ANTIEMETICS: None  CURRENT SMOKING STATUS: Non-smoker  ORAL CHEMOTHERAPY AND CONSENT: None  CURRENT BISPHOSPHONATES USE: None  LIVING WILL AND CODE STATUS: Full code.  INTERVAL HISTORY: [name] 70 y.o. male returns to the clinic today for followup visit accompanied by his wife. The patient is doing fine with no complaints today. He is tolerating his treatment with Tarceva fairly well. He denied having any skin rash or diarrhea. He denied having any significant chest pain, shortness of breath, cough or hemoptysis. The patient denied having any fever or chills, no vomiting, abdominal pain, diarrhea or constipation. He gained 2 pounds since his last visit. He is here today for evaluation and repeat blood work.  MEDICAL HISTORY: Past Medical History  Diagnosis Date  . Hypertension   . Hypercholesterolemia   . Sinus disease     hx of  . History of pneumonia  last 3-4 yrs ago    3 -4 different times  . Hypothyroidism   . Sleep apnea     uses cpap setting opf 12  . History of agent  Orange exposure 44-45 yrs ago  . Complication of anesthesia 2007    quit breathing with bronchoscopy, had to spend night  . Cholelithiases 07/16/2015  . lung ca dx'd 2007    lung right    ALLERGIES:  is allergic to lisinopril.  MEDICATIONS:  Current Outpatient Prescriptions  Medication Sig Dispense Refill  . amitriptyline (ELAVIL) 50 MG tablet Take 50 mg by mouth at bedtime. Reported on 06/18/2015    . aspirin 81 MG tablet Take 81 mg by mouth every morning.     . Cholecalciferol 2000 units CAPS Take 1 capsule by mouth daily.    . clindamycin (CLEOCIN-T) 1 % external solution Apply topically 2 (two) times daily. 240 mL 2  . clindamycin (CLINDAGEL) 1 % gel Apply 1 application topically 2 (two) times daily.    [name] diltiazem (CARDIZEM CD) 360 MG 24 hr capsule Take 360 mg by mouth every morning.     . erlotinib (TARCEVA) 100 MG tablet Take 1 tablet (100 mg total) by mouth daily. Take on an empty stomach 1 hour before meals or 2 hours after.    . erlotinib (TARCEVA) 100 MG tablet Take 1 tablet (100 mg total) by mouth daily. Take on an empty stomach 1 hour before meals or 2 hours after.    . levETIRAcetam (KEPPRA) 750 MG tablet     . levothyroxine (SYNTHROID, LEVOTHROID) 88 MCG tablet Take 88 mcg by mouth daily before breakfast.    . metoCLOPramide (REGLAN) 10 MG tablet Take 10 mg by mouth  every 6 (six) hours as needed for nausea.     . metoprolol succinate (TOPROL-XL) 25 MG 24 hr tablet Take 25 mg by mouth every morning.     . Multiple Vitamins-Minerals (CENTRUM SILVER PO) Take 1 tablet by mouth daily.     . naproxen sodium (ANAPROX) 550 MG tablet Take 550 mg by mouth 3 (three) times daily as needed for moderate pain. Reported on 08/27/2015    . nitroGLYCERIN (NITROSTAT) 0.4 MG SL tablet Place 0.4 mg under the tongue every 5 (five) minutes as needed for chest pain. Reported on 08/27/2015    . polyethylene glycol (MIRALAX / GLYCOLAX) packet Take 17 g by mouth daily. Reported on 08/27/2015    .  psyllium (METAMUCIL) 58.6 % powder Take 1 packet by mouth 3 (three) times daily. Reported on 08/27/2015    . rosuvastatin (CRESTOR) 5 MG tablet Take 5 mg by mouth every morning.     . valsartan-hydrochlorothiazide (DIOVAN-HCT) 320-25 MG per tablet Take 1 tablet by mouth every morning.      No current facility-administered medications for this visit.    SURGICAL HISTORY:  Past Surgical History  Procedure Laterality Date  . Cystourethroscopy, gyrus turp.  04/16/2010  . Torn medial and lateral menisci, left knee.  11/06/2006  . Right video-assisted thoracoscopy, right upper lobectomy and  01/23/2006  . The olympus video bronchoscope was introduced via the right  12/21/2005  . Nasal sinus surgery  1987  . Tonsillectomy  1957  . Colonoscopy with propofol N/A 03/19/2013    Procedure: COLONOSCOPY WITH PROPOFOL;  Surgeon: Garlan Fair, MD;  Location: [facility];  Service: Endoscopy;  Laterality: N/A;    REVIEW OF SYSTEMS:  A comprehensive review of systems was negative.   PHYSICAL EXAMINATION: General appearance: alert, cooperative and no distress Head: Normocephalic, without obvious abnormality, atraumatic Neck: no adenopathy Lymph nodes: Cervical, supraclavicular, and axillary nodes normal. Resp: clear to auscultation bilaterally Back: symmetric, no curvature. ROM normal. No CVA tenderness. Cardio: regular rate and rhythm, S1, S2 normal, no murmur, click, rub or gallop GI: soft, non-tender; bowel sounds normal; no masses,  no organomegaly Extremities: extremities normal, atraumatic, no cyanosis or edema Neurologic: Alert and oriented X 3, normal strength and tone. Normal symmetric reflexes. Normal coordination and gait    ECOG PERFORMANCE STATUS: 0 - Asymptomatic  Blood pressure 117/67, pulse 65, temperature 97.6 F (36.4 C), temperature source Oral, resp. rate 18, height 6' (1.829 m), weight 240 lb 14.4 oz (109.272 kg), SpO2 100 %.  LABORATORY DATA: Lab Results  Component  Value Date   WBC 4.6 09/24/2015   HGB 12.7* 09/24/2015   HCT 38.1* 09/24/2015   MCV 98.6* 09/24/2015   PLT 201 09/24/2015      Chemistry      Component Value Date/Time   NA 140 09/24/2015 0820   NA 138 07/17/2014 1000   NA 139 10/20/2011 0903   K 3.7 09/24/2015 0820   K 3.3* 07/17/2014 1000   K 3.4 10/20/2011 0903   CL 107 07/17/2014 1000   CL 102 04/24/2012 0853   CL 101 10/20/2011 0903   CO2 26 09/24/2015 0820   CO2 26 07/17/2014 1000   CO2 29 10/20/2011 0903   BUN 15.8 09/24/2015 0820   BUN 16 07/17/2014 1000   BUN 12 10/20/2011 0903   CREATININE 1.0 09/24/2015 0820   CREATININE 0.82 07/17/2014 1000   CREATININE 0.9 10/20/2011 0903      Component Value Date/Time   CALCIUM 9.1 09/24/2015  0820   CALCIUM 8.9 07/17/2014 1000   CALCIUM 9.0 10/20/2011 0903   ALKPHOS 53 09/24/2015 0820   ALKPHOS 53 07/17/2014 1000   ALKPHOS 52 10/20/2011 0903   AST 34 09/24/2015 0820   AST 24 07/17/2014 1000   AST 24 10/20/2011 0903   ALT 40 09/24/2015 0820   ALT 28 07/17/2014 1000   ALT 31 10/20/2011 0903   BILITOT 0.42 09/24/2015 0820   BILITOT 0.8 07/17/2014 1000   BILITOT 0.60 10/20/2011 0903       RADIOGRAPHIC STUDIES: Ct Chest W Contrast  09/03/2015  CLINICAL DATA:  Non-small cell right-sided lung cancer diagnosed in 2007, with right upper lobectomy but subsequent recurrence in the right lung in 2016, undergoing oral chemotherapy. EXAM: CT CHEST, ABDOMEN, AND PELVIS WITH CONTRAST RECIST 1.1 ASSESSMENT TECHNIQUE: Multidetector CT imaging of the chest, abdomen and pelvis was performed following the standard protocol during bolus administration of intravenous contrast. CONTRAST:  17m ISOVUE-300 IOPAMIDOL (ISOVUE-300) INJECTION 61% COMPARISON:  07/09/2015 FINDINGS: RECIST 1.1 Target Lesions: 1. Right lower lobe nodule on image 58/5: 1.7 cm in long axis, unchanged 2. Right lower lobe lung lesion on image 62/5:  1.3 cm in long axis Non-target Lesions: 1. Sub solid nodule anteriorly in  the right lower lobe on image 64/5: 0.5 cm in long axis 2. Posterior right lower lobe nodule on image 75/5: 1.1 cm in long axis. CT CHEST FINDINGS Mediastinum/Nodes: Right mediastinal shift due to right hemithoracic volume loss. No pathologic adenopathy. Lungs/Pleura: Right upper lobectomy. Stably diminished volume in the residual right middle lobe. Bandlike scarring in the right lower lobe is for the most part stable, with a 2.0 by 1.3 cm component on image 58 series 5 which previously measured the same by my measurement and also have the same morphology. Scar-like density with nodular components posteriorly in the right lung are observed. On image 61 series 5 a nodular component of this apparent scarring measures 0.8 by 0.6 cm, formerly the same. A lower nodular component on image 75/5 measures 1.1 by 0.6 cm, formerly measured at 0.8 by 0.5 cm. A ground-glass density nodule anteriorly in the right lower lobe on image 64 series 5 is slightly less confluent and measures 5 mm in long axis, previously 6 mm. The no new nodules. There is biapical pleural parenchymal scarring. No left-sided nodules are identified. Musculoskeletal: Unremarkable CT ABDOMEN PELVIS FINDINGS Hepatobiliary: 2.9 cm in long axis gallstone, image 56/2. Pancreas: Unremarkable Spleen: Unremarkable Adrenals/Urinary Tract: Adrenal glands remain normal. Exophytic 1.4 cm cyst from the right kidney upper pole. Stomach/Bowel: Prominent stool throughout the colon favors constipation. Vascular/Lymphatic: Aortoiliac atherosclerotic vascular disease. No pathologic abdominal adenopathy. Reproductive: Unremarkable Other: No supplemental non-categorized findings. Musculoskeletal: Lumbar spondylosis and degenerative disc disease causing impingement at L3-4, L4-5, and L5-S 1. Grade 1 degenerative retrolisthesis at L3-4 and L4-5. Degenerative loss of articular space in both hips. Left indirect inguinal hernia contains adipose tissue. IMPRESSION: 1. Generally  similar appearance of the nodularity along areas of scarring in the right lower lobe. One of the areas of nodularity is minimally larger than previous, and merit surveillance, although probably this changes incidental. No new nodules. 2. Cholelithiasis. 3. Lumbar spondylosis and degenerative disc disease cause impingement at L3-4, L4-5, and L5-S 1. 4.  Prominent stool throughout the colon favors constipation. 5.  Aortoiliac atherosclerotic vascular disease. Electronically Signed   By: WVan ClinesM.D.   On: 09/03/2015 10:42   Ct Abdomen Pelvis W Contrast  09/03/2015  CLINICAL DATA:  Non-small  cell right-sided lung cancer diagnosed in 2007, with right upper lobectomy but subsequent recurrence in the right lung in 2016, undergoing oral chemotherapy. EXAM: CT CHEST, ABDOMEN, AND PELVIS WITH CONTRAST RECIST 1.1 ASSESSMENT TECHNIQUE: Multidetector CT imaging of the chest, abdomen and pelvis was performed following the standard protocol during bolus administration of intravenous contrast. CONTRAST:  156m ISOVUE-300 IOPAMIDOL (ISOVUE-300) INJECTION 61% COMPARISON:  07/09/2015 FINDINGS: RECIST 1.1 Target Lesions: 1. Right lower lobe nodule on image 58/5: 1.7 cm in long axis, unchanged 2. Right lower lobe lung lesion on image 62/5:  1.3 cm in long axis Non-target Lesions: 1. Sub solid nodule anteriorly in the right lower lobe on image 64/5: 0.5 cm in long axis 2. Posterior right lower lobe nodule on image 75/5: 1.1 cm in long axis. CT CHEST FINDINGS Mediastinum/Nodes: Right mediastinal shift due to right hemithoracic volume loss. No pathologic adenopathy. Lungs/Pleura: Right upper lobectomy. Stably diminished volume in the residual right middle lobe. Bandlike scarring in the right lower lobe is for the most part stable, with a 2.0 by 1.3 cm component on image 58 series 5 which previously measured the same by my measurement and also have the same morphology. Scar-like density with nodular components posteriorly in  the right lung are observed. On image 61 series 5 a nodular component of this apparent scarring measures 0.8 by 0.6 cm, formerly the same. A lower nodular component on image 75/5 measures 1.1 by 0.6 cm, formerly measured at 0.8 by 0.5 cm. A ground-glass density nodule anteriorly in the right lower lobe on image 64 series 5 is slightly less confluent and measures 5 mm in long axis, previously 6 mm. The no new nodules. There is biapical pleural parenchymal scarring. No left-sided nodules are identified. Musculoskeletal: Unremarkable CT ABDOMEN PELVIS FINDINGS Hepatobiliary: 2.9 cm in long axis gallstone, image 56/2. Pancreas: Unremarkable Spleen: Unremarkable Adrenals/Urinary Tract: Adrenal glands remain normal. Exophytic 1.4 cm cyst from the right kidney upper pole. Stomach/Bowel: Prominent stool throughout the colon favors constipation. Vascular/Lymphatic: Aortoiliac atherosclerotic vascular disease. No pathologic abdominal adenopathy. Reproductive: Unremarkable Other: No supplemental non-categorized findings. Musculoskeletal: Lumbar spondylosis and degenerative disc disease causing impingement at L3-4, L4-5, and L5-S 1. Grade 1 degenerative retrolisthesis at L3-4 and L4-5. Degenerative loss of articular space in both hips. Left indirect inguinal hernia contains adipose tissue. IMPRESSION: 1. Generally similar appearance of the nodularity along areas of scarring in the right lower lobe. One of the areas of nodularity is minimally larger than previous, and merit surveillance, although probably this changes incidental. No new nodules. 2. Cholelithiasis. 3. Lumbar spondylosis and degenerative disc disease cause impingement at L3-4, L4-5, and L5-S 1. 4.  Prominent stool throughout the colon favors constipation. 5.  Aortoiliac atherosclerotic vascular disease. Electronically Signed   By: WVan ClinesM.D.   On: 09/03/2015 10:42   ASSESSMENT AND PLAN: This is a very pleasant 70years old white male with recurrent  non-small cell lung cancer, adenocarcinoma with positive EGFR mutation in exon 21 that was initially diagnosed as stage IA non-small cell lung cancer status post right upper lobectomy and has been observation since October of 2007. He is status post 6 weeks treatment with Tarceva 150 mg by mouth daily according to the BMS checkmate 370 clinical trial. He is currently on Tarceva 100 mg by mouth daily Status post 14 months and and tolerating it fairly well with no significant adverse effects. I recommended for the patient to continue his current treatment with Tarceva at the same dose. He would  come back for follow-up visit in 2 weeks for reevaluation with repeat blood work. The patient was advised to call immediately if he has any other concerning symptoms in the interval.  All questions were answered. The patient knows to call the clinic with any problems, questions or concerns. We can certainly see the patient much sooner if necessary.  Disclaimer: This note was dictated with voice recognition software. Similar sounding words can inadvertently be transcribed and may not be corrected upon review.

## 2015-10-07 ENCOUNTER — Encounter: Payer: Self-pay | Admitting: *Deleted

## 2015-10-07 ENCOUNTER — Encounter: Payer: Self-pay | Admitting: Internal Medicine

## 2015-10-07 DIAGNOSIS — C3431 Malignant neoplasm of lower lobe, right bronchus or lung: Secondary | ICD-10-CM

## 2015-10-07 MED ORDER — ERLOTINIB HCL 100 MG PO TABS: 100.0000 mg | ORAL_TABLET | Freq: Every day | ORAL | Status: DC

## 2015-10-08 ENCOUNTER — Other Ambulatory Visit (HOSPITAL_BASED_OUTPATIENT_CLINIC_OR_DEPARTMENT_OTHER): Payer: Medicare Other

## 2015-10-08 ENCOUNTER — Other Ambulatory Visit (HOSPITAL_COMMUNITY)
Admission: AD | Admit: 2015-10-08 | Discharge: 2015-10-08 | Disposition: A | Discharge status: Home / Self Care | Payer: Medicare Other | Source: Ambulatory Visit | Attending: Internal Medicine | Admitting: Internal Medicine

## 2015-10-08 ENCOUNTER — Ambulatory Visit (HOSPITAL_BASED_OUTPATIENT_CLINIC_OR_DEPARTMENT_OTHER): Payer: Medicare Other | Admitting: Internal Medicine

## 2015-10-08 ENCOUNTER — Encounter: Payer: Self-pay | Admitting: Internal Medicine

## 2015-10-08 VITALS — BP 128/69 | HR 58 | Temp 97.8°F | Resp 18 | Ht 72.0 in | Wt 240.2 lb

## 2015-10-08 DIAGNOSIS — C3431 Malignant neoplasm of lower lobe, right bronchus or lung: Secondary | ICD-10-CM | POA: Insufficient documentation

## 2015-10-08 DIAGNOSIS — Z006 Encounter for examination for normal comparison and control in clinical research program: Secondary | ICD-10-CM

## 2015-10-08 DIAGNOSIS — Z5111 Encounter for antineoplastic chemotherapy: Secondary | ICD-10-CM

## 2015-10-08 DIAGNOSIS — C3491 Malignant neoplasm of unspecified part of right bronchus or lung: Secondary | ICD-10-CM

## 2015-10-08 LAB — CBC WITH DIFFERENTIAL/PLATELET
BASO%: 0.4 % (ref 0.0–2.0)
BASOS ABS: 0 10*3/uL (ref 0.0–0.1)
EOS ABS: 0.2 10*3/uL (ref 0.0–0.5)
EOS%: 3.7 % (ref 0.0–7.0)
HEMATOCRIT: 38 % — AB (ref 38.4–49.9)
HEMOGLOBIN: 13 g/dL (ref 13.0–17.1)
LYMPH#: 2.1 10*3/uL (ref 0.9–3.3)
LYMPH%: 41.6 % (ref 14.0–49.0)
MCH: 33.2 pg (ref 27.2–33.4)
MCHC: 34.2 g/dL (ref 32.0–36.0)
MCV: 96.9 fL (ref 79.3–98.0)
MONO#: 0.6 10*3/uL (ref 0.1–0.9)
MONO%: 10.7 % (ref 0.0–14.0)
NEUT#: 2.3 10*3/uL (ref 1.5–6.5)
NEUT%: 43.6 % (ref 39.0–75.0)
Platelets: 215 10*3/uL (ref 140–400)
RBC: 3.92 10*6/uL — ABNORMAL LOW (ref 4.20–5.82)
RDW: 12.6 % (ref 11.0–14.6)
WBC: 5.2 10*3/uL (ref 4.0–10.3)

## 2015-10-08 LAB — COMPREHENSIVE METABOLIC PANEL
ALBUMIN: 3.9 g/dL (ref 3.5–5.0)
ALT: 34 U/L (ref 0–55)
ANION GAP: 10 meq/L (ref 3–11)
AST: 32 U/L (ref 5–34)
Alkaline Phosphatase: 57 U/L (ref 40–150)
BUN: 16 mg/dL (ref 7.0–26.0)
CALCIUM: 9.4 mg/dL (ref 8.4–10.4)
CHLORIDE: 105 meq/L (ref 98–109)
CO2: 27 mEq/L (ref 22–29)
Creatinine: 1 mg/dL (ref 0.7–1.3)
EGFR: 72 mL/min/{1.73_m2} — AB (ref >90)
Glucose: 103 mg/dl (ref 70–140)
POTASSIUM: 3.8 meq/L (ref 3.5–5.1)
Sodium: 141 mEq/L (ref 136–145)
Total Bilirubin: 0.43 mg/dL (ref 0.20–1.20)
Total Protein: 6.8 g/dL (ref 6.4–8.3)

## 2015-10-08 LAB — MAGNESIUM: MAGNESIUM: 2.6 mg/dL — AB (ref 1.5–2.5)

## 2015-10-08 LAB — PHOSPHORUS: PHOSPHORUS: 3.1 mg/dL (ref 2.5–4.6)

## 2015-10-08 LAB — LACTATE DEHYDROGENASE: LDH: 227 U/L (ref 125–245)

## 2015-10-08 NOTE — Progress Notes (Signed)
[facility] [phone]   [phone]) [phone]  OFFICE PROGRESS NOTE  [name],[name], MD 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 Roxan Hockey on 01/23/2006.  2) Tarceva 150 mg by mouth daily as part of the BMS checkmate 370 clinical trial. Status post 6 weeks of treatment.   CURRENT THERAPY: Tarceva 100 mg by mouth daily as part of the BMS checkmate 370 clinical trial. Started 07/17/2014 Status post 15 months of treatment.  DISEASE STAGE: Recurrent  CHEMOTHERAPY INTENT: Palliative.  CURRENT # OF CHEMOTHERAPY CYCLES: 16  CURRENT ANTIEMETICS: None  CURRENT SMOKING STATUS: Non-smoker  ORAL CHEMOTHERAPY AND CONSENT: None  CURRENT BISPHOSPHONATES USE: None  LIVING WILL AND CODE STATUS: Full code.  INTERVAL HISTORY: [name] 70 y.o. male returns to the clinic today for followup visit accompanied by his wife. The patient is doing fine with no complaints today. He is tolerating his treatment with Tarceva fairly well. He is status post 15 months of treatment. He denied having any skin rash or diarrhea. He denied having any significant chest pain, shortness of breath, cough or hemoptysis. The patient denied having any fever or chills, no vomiting, abdominal pain, diarrhea or constipation. He is here today for evaluation and repeat blood work.  MEDICAL HISTORY: Past Medical History  Diagnosis Date  . Hypertension   . Hypercholesterolemia   . Sinus disease     hx of  . History of pneumonia  last 3-4 yrs ago    3 -4 different times  . Hypothyroidism   . Sleep apnea     uses cpap setting opf 12  . History of agent  Orange exposure 44-45 yrs ago  . Complication of anesthesia 2007    quit breathing with bronchoscopy, had to spend night  . Cholelithiases 07/16/2015  . lung ca dx'd 2007    lung right    ALLERGIES:  is allergic to lisinopril.  MEDICATIONS:  Current Outpatient Prescriptions  Medication Sig Dispense Refill  . amitriptyline (ELAVIL) 50 MG tablet Take 50 mg by mouth at bedtime. Reported on 06/18/2015    . aspirin 81 MG tablet Take 81 mg by mouth every morning.     . Cholecalciferol 2000 units CAPS Take 1 capsule by mouth daily.    . clindamycin (CLEOCIN-T) 1 % external solution Apply topically 2 (two) times daily. 240 mL 2  . clindamycin (CLINDAGEL) 1 % gel Apply 1 application topically 2 (two) times daily.    [name] diltiazem (CARDIZEM CD) 360 MG 24 hr capsule Take 360 mg by mouth every morning.     . erlotinib (TARCEVA) 100 MG tablet Take 1 tablet (100 mg total) by mouth daily. Take on an empty stomach 1 hour before meals or 2 hours after.    . levETIRAcetam (KEPPRA) 750 MG tablet     . levothyroxine (SYNTHROID, LEVOTHROID) 88 MCG tablet Take 88 mcg by mouth daily before breakfast.    . metoCLOPramide (REGLAN) 10 MG tablet Take 10 mg by mouth every 6 (six) hours as needed for nausea.     . metoprolol succinate (TOPROL-XL) 25 MG 24 hr tablet Take 25 mg by mouth every morning.     [name]  Multiple Vitamins-Minerals (CENTRUM SILVER PO) Take 1 tablet by mouth daily.     . naproxen sodium (ANAPROX) 550 MG tablet Take 550 mg by mouth 3 (three) times daily as needed for moderate pain. Reported on 08/27/2015    . nitroGLYCERIN (NITROSTAT) 0.4 MG SL tablet Place 0.4 mg under the tongue every 5 (five) minutes as needed for chest pain. Reported on 08/27/2015    . polyethylene glycol (MIRALAX / GLYCOLAX) packet Take 17 g by mouth daily. Reported on 08/27/2015    . psyllium (METAMUCIL) 58.6 % powder Take 1 packet by mouth 3 (three) times daily. Reported on 08/27/2015    . rosuvastatin (CRESTOR) 5 MG tablet Take 5 mg  by mouth every morning.     . valsartan-hydrochlorothiazide (DIOVAN-HCT) 320-25 MG per tablet Take 1 tablet by mouth every morning.      No current facility-administered medications for this visit.    SURGICAL HISTORY:  Past Surgical History  Procedure Laterality Date  . Cystourethroscopy, gyrus turp.  04/16/2010  . Torn medial and lateral menisci, left knee.  11/06/2006  . Right video-assisted thoracoscopy, right upper lobectomy and  01/23/2006  . The olympus video bronchoscope was introduced via the right  12/21/2005  . Nasal sinus surgery  1987  . Tonsillectomy  1957  . Colonoscopy with propofol N/A 03/19/2013    Procedure: COLONOSCOPY WITH PROPOFOL;  Surgeon: Garlan Fair, MD;  Location: [facility];  Service: Endoscopy;  Laterality: N/A;    REVIEW OF SYSTEMS:  A comprehensive review of systems was negative.   PHYSICAL EXAMINATION: General appearance: alert, cooperative and no distress Head: Normocephalic, without obvious abnormality, atraumatic Neck: no adenopathy Lymph nodes: Cervical, supraclavicular, and axillary nodes normal. Resp: clear to auscultation bilaterally Back: symmetric, no curvature. ROM normal. No CVA tenderness. Cardio: regular rate and rhythm, S1, S2 normal, no murmur, click, rub or gallop GI: soft, non-tender; bowel sounds normal; no masses,  no organomegaly Extremities: extremities normal, atraumatic, no cyanosis or edema Neurologic: Alert and oriented X 3, normal strength and tone. Normal symmetric reflexes. Normal coordination and gait    ECOG PERFORMANCE STATUS: 0 - Asymptomatic  Blood pressure 128/69, pulse 58, temperature 97.8 F (36.6 C), temperature source Oral, resp. rate 18, height 6' (1.829 m), weight 240 lb 3.2 oz (108.954 kg), SpO2 99 %.  LABORATORY DATA: Lab Results  Component Value Date   WBC 5.2 10/08/2015   HGB 13.0 10/08/2015   HCT 38.0* 10/08/2015   MCV 96.9 10/08/2015   PLT 215 10/08/2015      Chemistry      Component  Value Date/Time   NA 140 09/24/2015 0820   NA 138 07/17/2014 1000   NA 139 10/20/2011 0903   K 3.7 09/24/2015 0820   K 3.3* 07/17/2014 1000   K 3.4 10/20/2011 0903   CL 107 07/17/2014 1000   CL 102 04/24/2012 0853   CL 101 10/20/2011 0903   CO2 26 09/24/2015 0820   CO2 26 07/17/2014 1000   CO2 29 10/20/2011 0903   BUN 15.8 09/24/2015 0820   BUN 16 07/17/2014 1000   BUN 12 10/20/2011 0903   CREATININE 1.0 09/24/2015 0820   CREATININE 0.82 07/17/2014 1000   CREATININE 0.9 10/20/2011 0903      Component Value Date/Time   CALCIUM 9.1 09/24/2015 0820   CALCIUM 8.9 07/17/2014 1000   CALCIUM 9.0 10/20/2011 0903   ALKPHOS 53 09/24/2015 0820   ALKPHOS 53 07/17/2014 1000   ALKPHOS 52 10/20/2011 0903  AST 34 09/24/2015 0820   AST 24 07/17/2014 1000   AST 24 10/20/2011 0903   ALT 40 09/24/2015 0820   ALT 28 07/17/2014 1000   ALT 31 10/20/2011 0903   BILITOT 0.42 09/24/2015 0820   BILITOT 0.8 07/17/2014 1000   BILITOT 0.60 10/20/2011 0903       RADIOGRAPHIC STUDIES: No results found. ASSESSMENT AND PLAN: This is a very pleasant 70 years old white male with recurrent non-small cell lung cancer, adenocarcinoma with positive EGFR mutation in exon 21 that was initially diagnosed as stage IA non-small cell lung cancer status post right upper lobectomy and has been observation since October of 2007. He is status post 6 weeks treatment with Tarceva 150 mg by mouth daily according to the BMS checkmate 370 clinical trial. He is currently on Tarceva 100 mg by mouth daily Status post 15 months and and tolerating it fairly well with no significant adverse effects. I recommended for the patient to continue his current treatment with Tarceva at the same dose. He would come back for follow-up visit in 2 weeks for reevaluation with repeat blood work. He will have repeat CT scan of the chest, abdomen and pelvis on 10/29/2015 according to the protocol. The patient was advised to call immediately if  he has any other concerning symptoms in the interval.  All questions were answered. The patient knows to call the clinic with any problems, questions or concerns. We can certainly see the patient much sooner if necessary.  Disclaimer: This note was dictated with voice recognition software. Similar sounding words can inadvertently be transcribed and may not be corrected upon review.

## 2015-10-08 NOTE — Progress Notes (Signed)
10/08/2015 0915 BMS 370 cycle 36 The patient returns to the clinic accompanied by his wife.  His labs were drawn and he had resting vital signs taken, including resting O2 sat. He reports having an uneventful last couple of weeks.  He continues with dyspnea only on exertion and confirms this is the same as it has been since baseline.  He has intermittent fatigue.  His rash remains the same, no worse.  The patient reports overall feeling well.  He has a good appetite.  He denies bladder or bowel problems, including diarrhea.  He denies bleeding or pain.  He denies any eye problems or changes in his vision.   Dr. [name] was in to see patient and perform a targeted physical exam.  Based on lab results review and PE, the patient was cleared to continue with protocol treatment. The patient denies having any problems taking erlotinib.  He continues to take 100 mg daily and is on commercial drug.  He reports not missing any doses and brought in his own calendars he records compliance on. The patient was reminded to call for any side effects or changes.  He continues to carry his study participation wallet card. This RN thanked the patient for his continued time and commitment to the study. Marcellus Scott, RN, BSN, MHA, OCN

## 2015-10-22 ENCOUNTER — Other Ambulatory Visit (HOSPITAL_COMMUNITY)
Admission: RE | Admit: 2015-10-22 | Discharge: 2015-10-22 | Disposition: A | Discharge status: Home / Self Care | Payer: Medicare Other | Source: Ambulatory Visit | Attending: Internal Medicine | Admitting: Internal Medicine

## 2015-10-22 ENCOUNTER — Encounter: Payer: Self-pay | Admitting: Internal Medicine

## 2015-10-22 ENCOUNTER — Encounter: Payer: Self-pay | Admitting: *Deleted

## 2015-10-22 ENCOUNTER — Ambulatory Visit (HOSPITAL_BASED_OUTPATIENT_CLINIC_OR_DEPARTMENT_OTHER): Payer: Medicare Other | Admitting: Internal Medicine

## 2015-10-22 ENCOUNTER — Other Ambulatory Visit (HOSPITAL_BASED_OUTPATIENT_CLINIC_OR_DEPARTMENT_OTHER): Payer: Medicare Other

## 2015-10-22 VITALS — BP 125/71 | HR 66 | Temp 97.6°F | Resp 18 | Ht 72.0 in | Wt 240.6 lb

## 2015-10-22 DIAGNOSIS — Z79899 Other long term (current) drug therapy: Secondary | ICD-10-CM | POA: Diagnosis not present

## 2015-10-22 DIAGNOSIS — C3431 Malignant neoplasm of lower lobe, right bronchus or lung: Secondary | ICD-10-CM | POA: Insufficient documentation

## 2015-10-22 DIAGNOSIS — Z006 Encounter for examination for normal comparison and control in clinical research program: Secondary | ICD-10-CM

## 2015-10-22 DIAGNOSIS — Z5111 Encounter for antineoplastic chemotherapy: Secondary | ICD-10-CM

## 2015-10-22 LAB — CBC & DIFF AND RETIC
BASO%: 0.6 % (ref 0.0–2.0)
BASOS ABS: 0 10*3/uL (ref 0.0–0.1)
EOS ABS: 0.2 10*3/uL (ref 0.0–0.5)
EOS%: 3.5 % (ref 0.0–7.0)
HEMATOCRIT: 39.3 % (ref 38.4–49.9)
HEMOGLOBIN: 13.3 g/dL (ref 13.0–17.1)
Immature Retic Fract: 15 % — ABNORMAL HIGH (ref 3.00–10.60)
LYMPH%: 39.3 % (ref 14.0–49.0)
MCH: 32.8 pg (ref 27.2–33.4)
MCHC: 33.8 g/dL (ref 32.0–36.0)
MCV: 97 fL (ref 79.3–98.0)
MONO#: 0.6 10*3/uL (ref 0.1–0.9)
MONO%: 12.1 % (ref 0.0–14.0)
NEUT#: 2.3 10*3/uL (ref 1.5–6.5)
NEUT%: 44.5 % (ref 39.0–75.0)
Platelets: 207 10*3/uL (ref 140–400)
RBC: 4.05 10*6/uL — ABNORMAL LOW (ref 4.20–5.82)
RDW: 12.7 % (ref 11.0–14.6)
Retic %: 1.49 % (ref 0.80–1.80)
Retic Ct Abs: 60.35 10*3/uL (ref 34.80–93.90)
WBC: 5.1 10*3/uL (ref 4.0–10.3)
lymph#: 2 10*3/uL (ref 0.9–3.3)

## 2015-10-22 LAB — LACTATE DEHYDROGENASE: LDH: 200 U/L (ref 125–245)

## 2015-10-22 LAB — TSH: TSH: 2.874 m(IU)/L (ref 0.320–4.118)

## 2015-10-22 LAB — COMPREHENSIVE METABOLIC PANEL
ALBUMIN: 4.1 g/dL (ref 3.5–5.0)
ALK PHOS: 57 U/L (ref 40–150)
ALT: 29 U/L (ref 0–55)
ANION GAP: 7 meq/L (ref 3–11)
AST: 28 U/L (ref 5–34)
BILIRUBIN TOTAL: 0.45 mg/dL (ref 0.20–1.20)
BUN: 18 mg/dL (ref 7.0–26.0)
CO2: 29 mEq/L (ref 22–29)
Calcium: 9.7 mg/dL (ref 8.4–10.4)
Chloride: 105 mEq/L (ref 98–109)
Creatinine: 1.1 mg/dL (ref 0.7–1.3)
EGFR: 69 mL/min/{1.73_m2} — AB (ref >90)
GLUCOSE: 103 mg/dL (ref 70–140)
Potassium: 4 mEq/L (ref 3.5–5.1)
SODIUM: 141 meq/L (ref 136–145)
TOTAL PROTEIN: 7 g/dL (ref 6.4–8.3)

## 2015-10-22 LAB — PHOSPHORUS: Phosphorus: 3.3 mg/dL (ref 2.5–4.6)

## 2015-10-22 LAB — MAGNESIUM: Magnesium: 2.7 mg/dl — ABNORMAL HIGH (ref 1.5–2.5)

## 2015-10-22 MED ORDER — ERLOTINIB HCL 100 MG PO TABS: 100.0000 mg | ORAL_TABLET | Freq: Every day | ORAL | Status: DC

## 2015-10-22 NOTE — Progress Notes (Signed)
10/22/2015 1018  Cycle 37, week 72 The patient returns to clinic with his wife.  Labs and resting vital signs and O2 sats were obtained.  He reports having had a good last 2 weeks.  He said he has not missed taking any of his study drug (commercial) and continues with erlotinib 100 mg daily.  He denies starting or taking any new medications since last visit. He reports that the rash on head, chest, back, and neck remains the same, comes and goes.  He continues to use the prescribed gels and creams for it when needed.  He reports the right toe toenail fungus is the same.  He continues to walk daily and has dyspnea only with exertion.  He denies wheezes or cough.  He has mild fatigue.  He denies diarrhea or constipation. He reports mild intermittent right upper quad. pain that has been off/on for a week starting around 10/15/15.  He states it does not bother him enough to take pain medication for it.   Dr. [name] was in to see the patient and performed a targeted physical exam.  MD made aware of the RUQ pain.  MD reminded and educated patient to report it if it becomes worse.  Patient is also scheduled for a CT scan next week. Based on physical exam and lab results review, Dr. [name] cleared the patient to continue treatment per protocol.  The patient knows to call for any problems in between visits.

## 2015-10-22 NOTE — Progress Notes (Signed)
[facility] [phone]   [phone]) [phone]  OFFICE PROGRESS NOTE  [name],[name], MD 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 Roxan Hockey on 01/23/2006.  2) Tarceva 150 mg by mouth daily as part of the BMS checkmate 370 clinical trial. Status post 6 weeks of treatment.   CURRENT THERAPY: Tarceva 100 mg by mouth daily as part of the BMS checkmate 370 clinical trial. Started 07/17/2014 Status post 15 months of treatment.  DISEASE STAGE: Recurrent  CHEMOTHERAPY INTENT: Palliative.  CURRENT # OF CHEMOTHERAPY CYCLES: 16  CURRENT ANTIEMETICS: None  CURRENT SMOKING STATUS: Non-smoker  ORAL CHEMOTHERAPY AND CONSENT: None  CURRENT BISPHOSPHONATES USE: None  LIVING WILL AND CODE STATUS: Full code.  INTERVAL HISTORY: [name] 70 y.o. male returns to the clinic today for followup visit accompanied by his wife. The patient is doing fine with no complaints today except for occasional pain and the left upper quadrant of the abdomen. He is tolerating his treatment with Tarceva fairly well. He is status post 15 months of treatment. He denied having any skin rash or diarrhea. He denied having any significant chest pain, shortness of breath, cough or hemoptysis. The patient denied having any fever or chills, no vomiting, abdominal pain, diarrhea or constipation. He is here today for evaluation and repeat blood work.  MEDICAL HISTORY: Past Medical History  Diagnosis Date  . Hypertension   . Hypercholesterolemia   . Sinus disease     hx of  . History of pneumonia  last 3-4 yrs ago    3 -4 different times  .  Hypothyroidism   . Sleep apnea     uses cpap setting opf 12  . History of agent Orange exposure 44-45 yrs ago  . Complication of anesthesia 2007    quit breathing with bronchoscopy, had to spend night  . Cholelithiases 07/16/2015  . lung ca dx'd 2007    lung right    ALLERGIES:  is allergic to lisinopril.  MEDICATIONS:  Current Outpatient Prescriptions  Medication Sig Dispense Refill  . amitriptyline (ELAVIL) 50 MG tablet Take 50 mg by mouth at bedtime. Reported on 06/18/2015    . aspirin 81 MG tablet Take 81 mg by mouth every morning.     . Cholecalciferol 2000 units CAPS Take 1 capsule by mouth daily.    . clindamycin (CLEOCIN-T) 1 % external solution Apply topically 2 (two) times daily. 240 mL 2  . clindamycin (CLINDAGEL) 1 % gel Apply 1 application topically 2 (two) times daily.    [name] diltiazem (CARDIZEM CD) 360 MG 24 hr capsule Take 360 mg by mouth every morning.     . erlotinib (TARCEVA) 100 MG tablet Take 1 tablet (100 mg total) by mouth daily. Take on an empty stomach 1 hour before meals or 2 hours after.    . erlotinib (TARCEVA) 100 MG tablet Take 1 tablet (100 mg total) by mouth daily. Take on an empty stomach 1 hour before meals or 2 hours after.    . levETIRAcetam (KEPPRA) 750 MG tablet     . levothyroxine (SYNTHROID, LEVOTHROID) 88 MCG tablet Take 88 mcg by mouth daily before breakfast.    .  metoCLOPramide (REGLAN) 10 MG tablet Take 10 mg by mouth every 6 (six) hours as needed for nausea.     . metoprolol succinate (TOPROL-XL) 25 MG 24 hr tablet Take 25 mg by mouth every morning.     . Multiple Vitamins-Minerals (CENTRUM SILVER PO) Take 1 tablet by mouth daily.     . naproxen sodium (ANAPROX) 550 MG tablet Take 550 mg by mouth 3 (three) times daily as needed for moderate pain. Reported on 08/27/2015    . nitroGLYCERIN (NITROSTAT) 0.4 MG SL tablet Place 0.4 mg under the tongue every 5 (five) minutes as needed for chest pain. Reported on 08/27/2015    . polyethylene glycol  (MIRALAX / GLYCOLAX) packet Take 17 g by mouth daily. Reported on 08/27/2015    . psyllium (METAMUCIL) 58.6 % powder Take 1 packet by mouth 3 (three) times daily. Reported on 08/27/2015    . rosuvastatin (CRESTOR) 5 MG tablet Take 5 mg by mouth every morning.     . valsartan-hydrochlorothiazide (DIOVAN-HCT) 320-25 MG per tablet Take 1 tablet by mouth every morning.      No current facility-administered medications for this visit.    SURGICAL HISTORY:  Past Surgical History  Procedure Laterality Date  . Cystourethroscopy, gyrus turp.  04/16/2010  . Torn medial and lateral menisci, left knee.  11/06/2006  . Right video-assisted thoracoscopy, right upper lobectomy and  01/23/2006  . The olympus video bronchoscope was introduced via the right  12/21/2005  . Nasal sinus surgery  1987  . Tonsillectomy  1957  . Colonoscopy with propofol N/A 03/19/2013    Procedure: COLONOSCOPY WITH PROPOFOL;  Surgeon: Garlan Fair, MD;  Location: [facility];  Service: Endoscopy;  Laterality: N/A;    REVIEW OF SYSTEMS:  A comprehensive review of systems was negative except for: Gastrointestinal: positive for abdominal pain   PHYSICAL EXAMINATION: General appearance: alert, cooperative and no distress Head: Normocephalic, without obvious abnormality, atraumatic Neck: no adenopathy Lymph nodes: Cervical, supraclavicular, and axillary nodes normal. Resp: clear to auscultation bilaterally Back: symmetric, no curvature. ROM normal. No CVA tenderness. Cardio: regular rate and rhythm, S1, S2 normal, no murmur, click, rub or gallop GI: soft, non-tender; bowel sounds normal; no masses,  no organomegaly Extremities: extremities normal, atraumatic, no cyanosis or edema Neurologic: Alert and oriented X 3, normal strength and tone. Normal symmetric reflexes. Normal coordination and gait    ECOG PERFORMANCE STATUS: 0 - Asymptomatic  Blood pressure 125/71, pulse 66, temperature 97.6 F (36.4 C), temperature source  Oral, resp. rate 18, height 6' (1.829 m), weight 240 lb 9.6 oz (109.135 kg), SpO2 100 %.  LABORATORY DATA: Lab Results  Component Value Date   WBC 5.1 10/22/2015   HGB 13.3 10/22/2015   HCT 39.3 10/22/2015   MCV 97.0 10/22/2015   PLT 207 10/22/2015      Chemistry      Component Value Date/Time   NA 141 10/08/2015 0833   NA 138 07/17/2014 1000   NA 139 10/20/2011 0903   K 3.8 10/08/2015 0833   K 3.3* 07/17/2014 1000   K 3.4 10/20/2011 0903   CL 107 07/17/2014 1000   CL 102 04/24/2012 0853   CL 101 10/20/2011 0903   CO2 27 10/08/2015 0833   CO2 26 07/17/2014 1000   CO2 29 10/20/2011 0903   BUN 16.0 10/08/2015 0833   BUN 16 07/17/2014 1000   BUN 12 10/20/2011 0903   CREATININE 1.0 10/08/2015 0833   CREATININE 0.82 07/17/2014 1000  CREATININE 0.9 10/20/2011 0903      Component Value Date/Time   CALCIUM 9.4 10/08/2015 0833   CALCIUM 8.9 07/17/2014 1000   CALCIUM 9.0 10/20/2011 0903   ALKPHOS 57 10/08/2015 0833   ALKPHOS 53 07/17/2014 1000   ALKPHOS 52 10/20/2011 0903   AST 32 10/08/2015 0833   AST 24 07/17/2014 1000   AST 24 10/20/2011 0903   ALT 34 10/08/2015 0833   ALT 28 07/17/2014 1000   ALT 31 10/20/2011 0903   BILITOT 0.43 10/08/2015 0833   BILITOT 0.8 07/17/2014 1000   BILITOT 0.60 10/20/2011 0903       RADIOGRAPHIC STUDIES: No results found. ASSESSMENT AND PLAN: This is a very pleasant 70 years old white male with recurrent non-small cell lung cancer, adenocarcinoma with positive EGFR mutation in exon 21 that was initially diagnosed as stage IA non-small cell lung cancer status post right upper lobectomy and has been observation since October of 2007. He is status post 6 weeks treatment with Tarceva 150 mg by mouth daily according to the BMS checkmate 370 clinical trial. He is currently on Tarceva 100 mg by mouth daily Status post 15 months and and tolerating it fairly well with no significant adverse effects. I recommended for the patient to continue  his current treatment with Tarceva at the same dose. He would come back for follow-up visit in 2 weeks for reevaluation with repeat blood work and CT scan of the chest, abdomen and pelvis for restaging of his disease. The right upper quadrant abdominal pain could be related to his history of gallbladder stone or referral pain from the surgical scar. We will continue to monitor it closely on the upcoming scan. The patient was advised to call immediately if he has any other concerning symptoms in the interval.  All questions were answered. The patient knows to call the clinic with any problems, questions or concerns. We can certainly see the patient much sooner if necessary.  Disclaimer: This note was dictated with voice recognition software. Similar sounding words can inadvertently be transcribed and may not be corrected upon review.

## 2015-10-26 MED FILL — TARCEVA 100 MG TABLET: 100 | 30 days supply | Qty: 30 | Fill #1

## 2015-10-29 ENCOUNTER — Ambulatory Visit (HOSPITAL_COMMUNITY)
Admission: RE | Admit: 2015-10-29 | Discharge: 2015-10-29 | Disposition: A | Discharge status: Home / Self Care | Payer: Medicare Other | Source: Ambulatory Visit | Attending: Internal Medicine | Admitting: Internal Medicine

## 2015-10-29 ENCOUNTER — Encounter (HOSPITAL_COMMUNITY): Payer: Self-pay

## 2015-10-29 DIAGNOSIS — Z9221 Personal history of antineoplastic chemotherapy: Secondary | ICD-10-CM | POA: Diagnosis not present

## 2015-10-29 DIAGNOSIS — R911 Solitary pulmonary nodule: Secondary | ICD-10-CM | POA: Diagnosis not present

## 2015-10-29 DIAGNOSIS — I700 Atherosclerosis of aorta: Secondary | ICD-10-CM | POA: Insufficient documentation

## 2015-10-29 DIAGNOSIS — I77810 Thoracic aortic ectasia: Secondary | ICD-10-CM | POA: Diagnosis not present

## 2015-10-29 DIAGNOSIS — C3431 Malignant neoplasm of lower lobe, right bronchus or lung: Secondary | ICD-10-CM | POA: Insufficient documentation

## 2015-10-29 DIAGNOSIS — K8020 Calculus of gallbladder without cholecystitis without obstruction: Secondary | ICD-10-CM | POA: Insufficient documentation

## 2015-10-29 DIAGNOSIS — C3490 Malignant neoplasm of unspecified part of unspecified bronchus or lung: Secondary | ICD-10-CM | POA: Diagnosis not present

## 2015-10-29 DIAGNOSIS — Z5111 Encounter for antineoplastic chemotherapy: Secondary | ICD-10-CM

## 2015-10-29 MED ORDER — IOPAMIDOL (ISOVUE-300) INJECTION 61%
100.0000 mL | Freq: Once | INTRAVENOUS | Status: AC | PRN
  Administered 2015-10-29: 100 mL via INTRAVENOUS

## 2015-11-04 ENCOUNTER — Encounter: Payer: Self-pay | Admitting: Internal Medicine

## 2015-11-04 ENCOUNTER — Encounter: Payer: Self-pay | Admitting: *Deleted

## 2015-11-04 DIAGNOSIS — C3431 Malignant neoplasm of lower lobe, right bronchus or lung: Secondary | ICD-10-CM

## 2015-11-04 MED ORDER — ERLOTINIB HCL 100 MG PO TABS: 100.0000 mg | ORAL_TABLET | Freq: Every day | ORAL | Status: DC

## 2015-11-05 ENCOUNTER — Ambulatory Visit (HOSPITAL_BASED_OUTPATIENT_CLINIC_OR_DEPARTMENT_OTHER): Payer: Medicare Other | Admitting: Internal Medicine

## 2015-11-05 ENCOUNTER — Encounter: Payer: Self-pay | Admitting: Internal Medicine

## 2015-11-05 ENCOUNTER — Other Ambulatory Visit (HOSPITAL_COMMUNITY)
Admission: RE | Admit: 2015-11-05 | Discharge: 2015-11-05 | Disposition: A | Discharge status: Home / Self Care | Payer: Medicare Other | Source: Ambulatory Visit | Attending: Internal Medicine | Admitting: Internal Medicine

## 2015-11-05 ENCOUNTER — Other Ambulatory Visit (HOSPITAL_BASED_OUTPATIENT_CLINIC_OR_DEPARTMENT_OTHER): Payer: Medicare Other

## 2015-11-05 ENCOUNTER — Telehealth: Payer: Self-pay | Admitting: Internal Medicine

## 2015-11-05 VITALS — BP 128/67 | HR 58 | Temp 97.7°F | Resp 18 | Ht 72.0 in | Wt 240.8 lb

## 2015-11-05 DIAGNOSIS — Z006 Encounter for examination for normal comparison and control in clinical research program: Secondary | ICD-10-CM

## 2015-11-05 DIAGNOSIS — C3431 Malignant neoplasm of lower lobe, right bronchus or lung: Secondary | ICD-10-CM

## 2015-11-05 DIAGNOSIS — Z5111 Encounter for antineoplastic chemotherapy: Secondary | ICD-10-CM

## 2015-11-05 LAB — COMPREHENSIVE METABOLIC PANEL
ALBUMIN: 4 g/dL (ref 3.5–5.0)
ALK PHOS: 60 U/L (ref 40–150)
ALT: 30 U/L (ref 0–55)
AST: 30 U/L (ref 5–34)
Anion Gap: 8 mEq/L (ref 3–11)
BUN: 17.2 mg/dL (ref 7.0–26.0)
CALCIUM: 9.8 mg/dL (ref 8.4–10.4)
CO2: 28 mEq/L (ref 22–29)
CREATININE: 1.1 mg/dL (ref 0.7–1.3)
Chloride: 105 mEq/L (ref 98–109)
EGFR: 67 mL/min/{1.73_m2} — ABNORMAL LOW (ref >90)
Glucose: 101 mg/dl (ref 70–140)
Potassium: 4.2 mEq/L (ref 3.5–5.1)
Sodium: 141 mEq/L (ref 136–145)
TOTAL PROTEIN: 6.8 g/dL (ref 6.4–8.3)
Total Bilirubin: 0.44 mg/dL (ref 0.20–1.20)

## 2015-11-05 LAB — MAGNESIUM: Magnesium: 2.5 mg/dl (ref 1.5–2.5)

## 2015-11-05 LAB — CBC WITH DIFFERENTIAL/PLATELET
BASO%: 0.4 % (ref 0.0–2.0)
BASOS ABS: 0 10*3/uL (ref 0.0–0.1)
EOS%: 2.9 % (ref 0.0–7.0)
Eosinophils Absolute: 0.2 10*3/uL (ref 0.0–0.5)
HEMATOCRIT: 38.1 % — AB (ref 38.4–49.9)
HEMOGLOBIN: 13 g/dL (ref 13.0–17.1)
LYMPH#: 2 10*3/uL (ref 0.9–3.3)
LYMPH%: 39.9 % (ref 14.0–49.0)
MCH: 33.1 pg (ref 27.2–33.4)
MCHC: 34.1 g/dL (ref 32.0–36.0)
MCV: 96.9 fL (ref 79.3–98.0)
MONO#: 0.7 10*3/uL (ref 0.1–0.9)
MONO%: 13.3 % (ref 0.0–14.0)
NEUT%: 43.5 % (ref 39.0–75.0)
NEUTROS ABS: 2.2 10*3/uL (ref 1.5–6.5)
Platelets: 216 10*3/uL (ref 140–400)
RBC: 3.93 10*6/uL — ABNORMAL LOW (ref 4.20–5.82)
RDW: 12.9 % (ref 11.0–14.6)
WBC: 5.1 10*3/uL (ref 4.0–10.3)

## 2015-11-05 LAB — PHOSPHORUS: Phosphorus: 3.5 mg/dL (ref 2.5–4.6)

## 2015-11-05 LAB — LACTATE DEHYDROGENASE: LDH: 209 U/L (ref 125–245)

## 2015-11-05 MED ORDER — ERLOTINIB HCL 100 MG PO TABS: 100.0000 mg | ORAL_TABLET | Freq: Every day | ORAL | Status: DC

## 2015-11-05 NOTE — Progress Notes (Signed)
11/05/2015 0921 BMS 370 cycle 38, week 74 The patient returns with his wife to the clinic.  He had labs and resting VS and O2 sats taken. He reports having had a great last 2 weeks.  He reports no changes in his symptoms since his last visit.  His rash remains the same and he continues with mild fatigue.  His RUQ pain remains mild and is not constant.  He does not take amy pain medication for it.  He denies any new symptoms or changes. He reports not missing any of his erlotinib and takes '100mg'$  daily of commercial supply. Dr. [name] was in to see the patient and performed a targeted physical exam.  MD aware of pulse=58 and stated no concern as patient is active and asymptomatic.  He reviewed the recent CT scan results from 10/29/15 with the patient.  Per MD, patient continues with SD. Based on CT scan results, physical exam, and lab results review, the patient was cleared to continue treatment with erlotinib on the BMS 370 study.  The patient understands to contact Dr. [name] with any problems or changes in his condition. This RN thanked the patient for his continued participation on the BMS 370 study.

## 2015-11-05 NOTE — Progress Notes (Signed)
[facility] [phone]   [phone]  OFFICE PROGRESS NOTE  [name],[name], MD 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 Roxan Hockey on 01/23/2006.  2) Tarceva 150 mg by mouth daily as part of the BMS checkmate 370 clinical trial. Status post 6 weeks of treatment.   CURRENT THERAPY: Tarceva 100 mg by mouth daily as part of the BMS checkmate 370 clinical trial. Started 07/17/2014 Status post 16 months of treatment.  DISEASE STAGE: Recurrent  CHEMOTHERAPY INTENT: Palliative.  CURRENT # OF CHEMOTHERAPY CYCLES: 17  CURRENT ANTIEMETICS: None  CURRENT SMOKING STATUS: Non-smoker  ORAL CHEMOTHERAPY AND CONSENT: None  CURRENT BISPHOSPHONATES USE: None  LIVING WILL AND CODE STATUS: Full code.  INTERVAL HISTORY: [name] 70 y.o. male returns to the clinic today for followup visit accompanied by his wife. The patient is doing fine with no complaints today. He is currently on treatment with Tarceva 100 mg by mouth daily status post 16 months and tolerating it fairly well. He denied having any skin rash or diarrhea. He denied having any significant chest pain, shortness of breath, cough or hemoptysis. The patient denied having any fever or chills, no vomiting, abdominal pain, diarrhea or constipation. He had repeat CT scan of the chest, abdomen and pelvis performed recently and he is here for evaluation and discussion of his scan results.  MEDICAL HISTORY: Past Medical History:  Diagnosis Date  . Cholelithiases 07/16/2015  . Complication of anesthesia 2007   quit breathing with bronchoscopy, had to spend night  .  History of agent Orange exposure 44-45 yrs ago  . History of pneumonia  last 3-4 yrs ago   3 -4 different times  . Hypercholesterolemia   . Hypertension   . Hypothyroidism   . lung ca dx'd 2007   lung right  . Sinus disease    hx of  . Sleep apnea    uses cpap setting opf 12    ALLERGIES:  is allergic to lisinopril.  MEDICATIONS:  Current Outpatient Prescriptions  Medication Sig Dispense Refill  . amitriptyline (ELAVIL) 50 MG tablet Take 50 mg by mouth at bedtime. Reported on 06/18/2015    . aspirin 81 MG tablet Take 81 mg by mouth every morning.     . Cholecalciferol 2000 units CAPS Take 1 capsule by mouth daily.    . clindamycin (CLEOCIN-T) 1 % external solution Apply topically 2 (two) times daily. 240 mL 2  . clindamycin (CLINDAGEL) 1 % gel Apply 1 application topically 2 (two) times daily.    [name] diltiazem (CARDIZEM CD) 360 MG 24 hr capsule Take 360 mg by mouth every morning.     . erlotinib (TARCEVA) 100 MG tablet Take 1 tablet (100 mg total) by mouth daily. Take on an empty stomach 1 hour before meals or 2 hours after.    . levETIRAcetam (KEPPRA) 750 MG tablet     . levothyroxine (SYNTHROID, LEVOTHROID) 88 MCG tablet Take 88 mcg by mouth daily before breakfast.    . metoCLOPramide (REGLAN) 10 MG tablet Take 10 mg by mouth every 6 (six) hours as needed for nausea.     . metoprolol succinate (TOPROL-XL) 25  MG 24 hr tablet Take 25 mg by mouth every morning.     . Multiple Vitamins-Minerals (CENTRUM SILVER PO) Take 1 tablet by mouth daily.     . naproxen sodium (ANAPROX) 550 MG tablet Take 550 mg by mouth 3 (three) times daily as needed for moderate pain. Reported on 08/27/2015    . polyethylene glycol (MIRALAX / GLYCOLAX) packet Take 17 g by mouth daily. Reported on 08/27/2015    . psyllium (METAMUCIL) 58.6 % powder Take 1 packet by mouth 3 (three) times daily. Reported on 08/27/2015    . rosuvastatin (CRESTOR) 5 MG tablet Take 5 mg by mouth every morning.     .  valsartan-hydrochlorothiazide (DIOVAN-HCT) 320-25 MG per tablet Take 1 tablet by mouth every morning.     . nitroGLYCERIN (NITROSTAT) 0.4 MG SL tablet Place 0.4 mg under the tongue every 5 (five) minutes as needed for chest pain. Reported on 10/22/2015     No current facility-administered medications for this visit.     SURGICAL HISTORY:  Past Surgical History:  Procedure Laterality Date  . COLONOSCOPY WITH PROPOFOL N/A 03/19/2013   Procedure: COLONOSCOPY WITH PROPOFOL;  Surgeon: Garlan Fair, MD;  Location: [facility];  Service: Endoscopy;  Laterality: N/A;  . Cystourethroscopy, [facility].  04/16/2010  . NASAL SINUS SURGERY  1987  . Right video-assisted thoracoscopy, right upper lobectomy and  01/23/2006  . The Olympus video bronchoscope was introduced via the right  12/21/2005  . TONSILLECTOMY  1957  . Torn medial and lateral menisci, left knee.  11/06/2006    REVIEW OF SYSTEMS:  Constitutional: negative Eyes: negative Ears, nose, mouth, throat, and face: negative Respiratory: negative Cardiovascular: negative Gastrointestinal: negative Genitourinary:negative Integument/breast: negative Hematologic/lymphatic: negative Musculoskeletal:negative Neurological: negative Behavioral/Psych: negative Endocrine: negative Allergic/Immunologic: negative   PHYSICAL EXAMINATION: General appearance: alert, cooperative and no distress Head: Normocephalic, without obvious abnormality, atraumatic Neck: no adenopathy Lymph nodes: Cervical, supraclavicular, and axillary nodes normal. Resp: clear to auscultation bilaterally Back: symmetric, no curvature. ROM normal. No CVA tenderness. Cardio: regular rate and rhythm, S1, S2 normal, no murmur, click, rub or gallop GI: soft, non-tender; bowel sounds normal; no masses,  no organomegaly Extremities: extremities normal, atraumatic, no cyanosis or edema Neurologic: Alert and oriented X 3, normal strength and tone. Normal symmetric reflexes.  Normal coordination and gait    ECOG PERFORMANCE STATUS: 0 - Asymptomatic  Blood pressure 128/67, pulse (!) 58, temperature 97.7 F (36.5 C), temperature source Oral, resp. rate 18, height 6' (1.829 m), weight 240 lb 12.8 oz (109.2 kg), SpO2 100 %.  LABORATORY DATA: Lab Results  Component Value Date   WBC 5.1 11/05/2015   HGB 13.0 11/05/2015   HCT 38.1 (L) 11/05/2015   MCV 96.9 11/05/2015   PLT 216 11/05/2015      Chemistry      Component Value Date/Time   NA 141 11/05/2015 0851   K 4.2 11/05/2015 0851   CL 107 07/17/2014 1000   CL 102 04/24/2012 0853   CO2 28 11/05/2015 0851   BUN 17.2 11/05/2015 0851   CREATININE 1.1 11/05/2015 0851      Component Value Date/Time   CALCIUM 9.8 11/05/2015 0851   ALKPHOS 60 11/05/2015 0851   AST 30 11/05/2015 0851   ALT 30 11/05/2015 0851   BILITOT 0.44 11/05/2015 0851       RADIOGRAPHIC STUDIES: Ct Chest W Contrast  Addendum Date: 11/04/2015   ADDENDUM REPORT: 11/04/2015 09:33 ADDENDUM: RECIST  correction: Target lesion: Target Lesion 2: RIGHT  lower lobe lung nodule measures 1.3 cm (image 61, series 5) unchanged from 1.3 cm with mm. Electronically Signed   By: Suzy Bouchard M.D.   On: 11/04/2015 09:33   Result Date: 11/04/2015 CLINICAL DATA:  70 year old male with history of lung cancer. Restaging examination. EXAM: CT CHEST, ABDOMEN, AND PELVIS WITH CONTRAST TECHNIQUE: Multidetector CT imaging of the chest, abdomen and pelvis was performed following the standard protocol during bolus administration of intravenous contrast. CONTRAST:  171m ISOVUE-300 IOPAMIDOL (ISOVUE-300) INJECTION 61% COMPARISON:  Multiple priors, most recently CT of the chest, abdomen and pelvis 09/03/2015. FINDINGS: RECIST 1.1 Target Lesions: 1. Right lower lobe pulmonary nodule (image 58 of series 5), 1.8 cm long axis (unchanged). 2. Right lower lobe pulmonary nodule (image 67 of series 5), 6 mm in short axis (decreased from 8 mm on prior study). Non-target  Lesions: 1. Resolved sub solid nodule anteriorly in the right lower lobe. 2. Right lower lobe pulmonary nodule (image 73 of series 5), and 9 mm in long axis (decreased from 11 mm on prior study). CT CHEST FINDINGS Cardiovascular: Heart size is normal. There is no significant pericardial fluid, thickening or pericardial calcification. Aortic atherosclerosis (mild), with mild ectasia of the ascending thoracic aorta (4.0 cm in diameter), which is unchanged. No calcifications in the coronary arteries. Mediastinum/Nodes: No pathologically enlarged mediastinal or hilar lymph nodes. Esophagus is unremarkable in appearance. No axillary lymphadenopathy. Lungs/Pleura: Status post right upper lobectomy. Compensatory hyperexpansion of the right middle and lower lobes. Right lower lobe pulmonary nodule (image 58 of series 5) measures 1.8 x 1.4 cm (unchanged when accounting for differences in measurement technique). Other previously noted right lower lobe pulmonary nodule has significantly decreased in size, currently measuring 6 x 4 mm (previously 8 x 6 mm on 09/03/2015). Right lower lobe pulmonary nodule (image 73 of series 5), decreased in size, currently 9 x 5 mm (previously 11 x 6 mm on 09/03/2015). No definite new suspicious appearing pulmonary nodules or masses are noted. No acute consolidative airspace disease. No pleural effusions. Musculoskeletal: There are no aggressive appearing lytic or blastic lesions noted in the visualized portions of the skeleton. CT ABDOMEN AND PELVIS FINDINGS Hepatobiliary: No cystic or solid hepatic lesions. No intra or extrahepatic biliary ductal dilatation. 2.5 cm rim calcified gallstone in the gallbladder. No findings to suggest an acute cholecystitis at this time. Pancreas: No pancreatic mass. No pancreatic ductal dilatation. No pancreatic or peripancreatic fluid or inflammatory changes. Spleen: Unremarkable. Adrenals/Urinary Tract: Exophytic 1.6 cm low-attenuation lesion in the anterior  aspect of the interpolar region of the right kidney is compatible with a small simple cysts. Left kidney and bilateral adrenal glands are normal in appearance. Mild bilateral perinephric stranding (nonspecific) is similar to prior examinations. No hydroureteronephrosis. Urinary bladder is normal in appearance. Stomach/Bowel: Normal appearance of the stomach. No pathologic dilatation of small bowel or colon. Normal appendix. Vascular/Lymphatic: Aortic atherosclerosis, without evidence of aneurysm or dissection in the abdominal or pelvic vasculature. No lymphadenopathy noted in the abdomen or pelvis. Reproductive: Prostate gland and seminal vesicles are unremarkable in appearance. Other: No significant volume of ascites.  No pneumoperitoneum. Musculoskeletal: There are no aggressive appearing lytic or blastic lesions noted in the visualized portions of the skeleton. IMPRESSION: 1. Today's study demonstrates slight regression of 1 of the target and 1 of the non target lesions, as detailed above. No findings to suggest local recurrence of disease or new metastatic disease in the chest, abdomen or pelvis. 2. Aortic atherosclerosis, with new mild ectasia  of the ascending thoracic aorta (4.0 cm in diameter), which is unchanged. Continued attention on future followup examinations is recommended to ensure the stability of this finding. 3. Cholelithiasis without evidence of acute cholecystitis at this time. 4. Additional incidental findings, as above. Electronically Signed: By: Vinnie Langton M.D. On: 10/29/2015 10:49   Ct Abdomen Pelvis W Contrast  Addendum Date: 11/04/2015   ADDENDUM REPORT: 11/04/2015 09:33 ADDENDUM: RECIST  correction: Target lesion: Target Lesion 2: RIGHT lower lobe lung nodule measures 1.3 cm (image 61, series 5) unchanged from 1.3 cm with mm. Electronically Signed   By: Suzy Bouchard M.D.   On: 11/04/2015 09:33   Result Date: 11/04/2015 CLINICAL DATA:  70 year old male with history of lung  cancer. Restaging examination. EXAM: CT CHEST, ABDOMEN, AND PELVIS WITH CONTRAST TECHNIQUE: Multidetector CT imaging of the chest, abdomen and pelvis was performed following the standard protocol during bolus administration of intravenous contrast. CONTRAST:  126m ISOVUE-300 IOPAMIDOL (ISOVUE-300) INJECTION 61% COMPARISON:  Multiple priors, most recently CT of the chest, abdomen and pelvis 09/03/2015. FINDINGS: RECIST 1.1 Target Lesions: 1. Right lower lobe pulmonary nodule (image 58 of series 5), 1.8 cm long axis (unchanged). 2. Right lower lobe pulmonary nodule (image 67 of series 5), 6 mm in short axis (decreased from 8 mm on prior study). Non-target Lesions: 1. Resolved sub solid nodule anteriorly in the right lower lobe. 2. Right lower lobe pulmonary nodule (image 73 of series 5), and 9 mm in long axis (decreased from 11 mm on prior study). CT CHEST FINDINGS Cardiovascular: Heart size is normal. There is no significant pericardial fluid, thickening or pericardial calcification. Aortic atherosclerosis (mild), with mild ectasia of the ascending thoracic aorta (4.0 cm in diameter), which is unchanged. No calcifications in the coronary arteries. Mediastinum/Nodes: No pathologically enlarged mediastinal or hilar lymph nodes. Esophagus is unremarkable in appearance. No axillary lymphadenopathy. Lungs/Pleura: Status post right upper lobectomy. Compensatory hyperexpansion of the right middle and lower lobes. Right lower lobe pulmonary nodule (image 58 of series 5) measures 1.8 x 1.4 cm (unchanged when accounting for differences in measurement technique). Other previously noted right lower lobe pulmonary nodule has significantly decreased in size, currently measuring 6 x 4 mm (previously 8 x 6 mm on 09/03/2015). Right lower lobe pulmonary nodule (image 73 of series 5), decreased in size, currently 9 x 5 mm (previously 11 x 6 mm on 09/03/2015). No definite new suspicious appearing pulmonary nodules or masses are noted.  No acute consolidative airspace disease. No pleural effusions. Musculoskeletal: There are no aggressive appearing lytic or blastic lesions noted in the visualized portions of the skeleton. CT ABDOMEN AND PELVIS FINDINGS Hepatobiliary: No cystic or solid hepatic lesions. No intra or extrahepatic biliary ductal dilatation. 2.5 cm rim calcified gallstone in the gallbladder. No findings to suggest an acute cholecystitis at this time. Pancreas: No pancreatic mass. No pancreatic ductal dilatation. No pancreatic or peripancreatic fluid or inflammatory changes. Spleen: Unremarkable. Adrenals/Urinary Tract: Exophytic 1.6 cm low-attenuation lesion in the anterior aspect of the interpolar region of the right kidney is compatible with a small simple cysts. Left kidney and bilateral adrenal glands are normal in appearance. Mild bilateral perinephric stranding (nonspecific) is similar to prior examinations. No hydroureteronephrosis. Urinary bladder is normal in appearance. Stomach/Bowel: Normal appearance of the stomach. No pathologic dilatation of small bowel or colon. Normal appendix. Vascular/Lymphatic: Aortic atherosclerosis, without evidence of aneurysm or dissection in the abdominal or pelvic vasculature. No lymphadenopathy noted in the abdomen or pelvis. Reproductive: Prostate gland and  seminal vesicles are unremarkable in appearance. Other: No significant volume of ascites.  No pneumoperitoneum. Musculoskeletal: There are no aggressive appearing lytic or blastic lesions noted in the visualized portions of the skeleton. IMPRESSION: 1. Today's study demonstrates slight regression of 1 of the target and 1 of the non target lesions, as detailed above. No findings to suggest local recurrence of disease or new metastatic disease in the chest, abdomen or pelvis. 2. Aortic atherosclerosis, with new mild ectasia of the ascending thoracic aorta (4.0 cm in diameter), which is unchanged. Continued attention on future followup  examinations is recommended to ensure the stability of this finding. 3. Cholelithiasis without evidence of acute cholecystitis at this time. 4. Additional incidental findings, as above. Electronically Signed: By: Vinnie Langton M.D. On: 10/29/2015 10:49   ASSESSMENT AND PLAN: This is a very pleasant 70 years old white male with recurrent non-small cell lung cancer, adenocarcinoma with positive EGFR mutation in exon 21 that was initially diagnosed as stage IA non-small cell lung cancer status post right upper lobectomy and has been observation since October of 2007. He is status post 6 weeks treatment with Tarceva 150 mg by mouth daily according to the BMS checkmate 370 clinical trial. He is currently on Tarceva 100 mg by mouth daily Status post 16 months and and tolerating it fairly well with no significant adverse effects. The recent CT scan of the chest, abdomen and pelvis showed no evidence for disease progression. I discussed the scan results with the patient and his wife. I recommended for the patient to continue his current treatment with Tarceva at the same dose. He would come back for follow-up visit in 2 weeks for reevaluation with repeat blood work according to the protocol.  The patient was advised to call immediately if he has any other concerning symptoms in the interval.  All questions were answered. The patient knows to call the clinic with any problems, questions or concerns. We can certainly see the patient much sooner if necessary.  Disclaimer: This note was dictated with voice recognition software. Similar sounding words can inadvertently be transcribed and may not be corrected upon review.

## 2015-11-05 NOTE — Telephone Encounter (Signed)
Gave pt cal & avs °

## 2015-11-17 ENCOUNTER — Ambulatory Visit (INDEPENDENT_AMBULATORY_CARE_PROVIDER_SITE_OTHER): Payer: Medicare Other | Admitting: Thoracic Surgery (Cardiothoracic Vascular Surgery)

## 2015-11-17 ENCOUNTER — Encounter: Payer: Self-pay | Admitting: Thoracic Surgery (Cardiothoracic Vascular Surgery)

## 2015-11-17 VITALS — BP 123/80 | HR 67 | Resp 16 | Ht 72.0 in | Wt 240.0 lb

## 2015-11-17 DIAGNOSIS — C3491 Malignant neoplasm of unspecified part of right bronchus or lung: Secondary | ICD-10-CM | POA: Diagnosis not present

## 2015-11-17 DIAGNOSIS — R918 Other nonspecific abnormal finding of lung field: Secondary | ICD-10-CM

## 2015-11-17 DIAGNOSIS — Z902 Acquired absence of lung [part of]: Secondary | ICD-10-CM

## 2015-11-17 NOTE — Progress Notes (Signed)
[address]       [place] [address]             [phone]       HPI: [name] returns for a scheduled 6 month follow-up visit.  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  He did well for a long time and then developed some slowly growing groundglass nodules. Biopsy showed non-small cell carcinoma. He was EGFR positive. He was treated with 6 weeks of Tarceva at 150 mg. He now is on a 100 mg maintenance dose.  He recently saw Dr. [name].  His primary complaint is that he lacks energy. He has some right flank pain as well. He says that it sometimes is associated with food, but then it comes and goes and is not consistent. His weight is stable. He's not had any unusual headaches or visual changes. He does not have any new or unusual bone or joint pain.  Past Medical History:  Diagnosis Date  . Cholelithiases 07/16/2015  . Complication of anesthesia 2007   quit breathing with bronchoscopy, had to spend night  . History of agent Orange exposure 44-45 yrs ago  . History of pneumonia  last 3-4 yrs ago   3 -4 different times  . Hypercholesterolemia   . Hypertension   . Hypothyroidism   . lung ca dx'd 2007   lung right  . Sinus disease    hx of  . Sleep apnea    uses cpap setting opf 12      Current Outpatient Prescriptions  Medication Sig Dispense Refill  . amitriptyline (ELAVIL) 50 MG tablet Take 50 mg by mouth at bedtime. Reported on 06/18/2015    . aspirin 81 MG tablet Take 81 mg by mouth every morning.     . Cholecalciferol 2000 units CAPS Take 1 capsule by mouth daily.    . clindamycin (CLEOCIN-T) 1 % external solution Apply topically 2 (two) times daily. 240 mL 2  . clindamycin (CLINDAGEL) 1 % gel Apply 1 application topically 2 (two) times daily.    [name] diltiazem (CARDIZEM CD) 360 MG 24 hr capsule Take 360 mg by mouth every morning.     . erlotinib (TARCEVA) 100 MG tablet Take 1 tablet (100 mg  total) by mouth daily. Take on an empty stomach 1 hour before meals or 2 hours after.    . levETIRAcetam (KEPPRA) 750 MG tablet     . levothyroxine (SYNTHROID, LEVOTHROID) 88 MCG tablet Take 88 mcg by mouth daily before breakfast.    . metoCLOPramide (REGLAN) 10 MG tablet Take 10 mg by mouth every 6 (six) hours as needed for nausea.     . metoprolol succinate (TOPROL-XL) 25 MG 24 hr tablet Take 25 mg by mouth every morning.     . Multiple Vitamins-Minerals (CENTRUM SILVER PO) Take 1 tablet by mouth daily.     . naproxen sodium (ANAPROX) 550 MG tablet Take 550 mg by mouth 3 (three) times daily as needed for moderate pain. Reported on 08/27/2015    . nitroGLYCERIN (NITROSTAT) 0.4 MG SL tablet Place 0.4 mg under the tongue every 5 (five) minutes as needed for chest pain. Reported on 10/22/2015    . polyethylene glycol (MIRALAX / GLYCOLAX) packet Take 17 g by mouth daily. Reported on 08/27/2015    . psyllium (METAMUCIL) 58.6 % powder Take 1 packet by mouth 3 (three) times daily. Reported  on 08/27/2015    . rosuvastatin (CRESTOR) 5 MG tablet Take 5 mg by mouth every morning.     . valsartan-hydrochlorothiazide (DIOVAN-HCT) 320-25 MG per tablet Take 1 tablet by mouth every morning.      No current facility-administered medications for this visit.     Physical Exam  Diagnostic Tests: ADDENDUM: RECIST  correction:  Target lesion:  Target Lesion 2: RIGHT lower lobe lung nodule measures 1.3 cm (image 61, series 5) unchanged from 1.3 cm with mm.   Electronically Signed   By: Suzy Bouchard M.D.   On: 11/04/2015 09:33   Addended by Gus Height, MD on 11/04/2015 9:36 AM    Study Result   CLINICAL DATA:  70 year old male with history of lung cancer. Restaging examination.  EXAM: CT CHEST, ABDOMEN, AND PELVIS WITH CONTRAST  TECHNIQUE: Multidetector CT imaging of the chest, abdomen and pelvis was performed following the standard protocol during bolus administration of intravenous  contrast.  CONTRAST:  168m ISOVUE-300 IOPAMIDOL (ISOVUE-300) INJECTION 61%  COMPARISON:  Multiple priors, most recently CT of the chest, abdomen and pelvis 09/03/2015.  FINDINGS: RECIST 1.1  Target Lesions:  1. Right lower lobe pulmonary nodule (image 58 of series 5), 1.8 cm long axis (unchanged). 2. Right lower lobe pulmonary nodule (image 67 of series 5), 6 mm in short axis (decreased from 8 mm on prior study). Non-target Lesions:  1. Resolved sub solid nodule anteriorly in the right lower lobe. 2. Right lower lobe pulmonary nodule (image 73 of series 5), and 9 mm in long axis (decreased from 11 mm on prior study). CT CHEST FINDINGS  Cardiovascular: Heart size is normal. There is no significant pericardial fluid, thickening or pericardial calcification. Aortic atherosclerosis (mild), with mild ectasia of the ascending thoracic aorta (4.0 cm in diameter), which is unchanged. No calcifications in the coronary arteries.  Mediastinum/Nodes: No pathologically enlarged mediastinal or hilar lymph nodes. Esophagus is unremarkable in appearance. No axillary lymphadenopathy.  Lungs/Pleura: Status post right upper lobectomy. Compensatory hyperexpansion of the right middle and lower lobes. Right lower lobe pulmonary nodule (image 58 of series 5) measures 1.8 x 1.4 cm (unchanged when accounting for differences in measurement technique). Other previously noted right lower lobe pulmonary nodule has significantly decreased in size, currently measuring 6 x 4 mm (previously 8 x 6 mm on 09/03/2015). Right lower lobe pulmonary nodule (image 73 of series 5), decreased in size, currently 9 x 5 mm (previously 11 x 6 mm on 09/03/2015). No definite new suspicious appearing pulmonary nodules or masses are noted. No acute consolidative airspace disease. No pleural effusions.  Musculoskeletal: There are no aggressive appearing lytic or blastic lesions noted in the visualized portions  of the skeleton.  CT ABDOMEN AND PELVIS FINDINGS  Hepatobiliary: No cystic or solid hepatic lesions. No intra or extrahepatic biliary ductal dilatation. 2.5 cm rim calcified gallstone in the gallbladder. No findings to suggest an acute cholecystitis at this time.  Pancreas: No pancreatic mass. No pancreatic ductal dilatation. No pancreatic or peripancreatic fluid or inflammatory changes.  Spleen: Unremarkable.  Adrenals/Urinary Tract: Exophytic 1.6 cm low-attenuation lesion in the anterior aspect of the interpolar region of the right kidney is compatible with a small simple cysts. Left kidney and bilateral adrenal glands are normal in appearance. Mild bilateral perinephric stranding (nonspecific) is similar to prior examinations. No hydroureteronephrosis. Urinary bladder is normal in appearance.  Stomach/Bowel: Normal appearance of the stomach. No pathologic dilatation of small bowel or colon. Normal appendix.  Vascular/Lymphatic: Aortic atherosclerosis, without  evidence of aneurysm or dissection in the abdominal or pelvic vasculature. No lymphadenopathy noted in the abdomen or pelvis.  Reproductive: Prostate gland and seminal vesicles are unremarkable in appearance.  Other: No significant volume of ascites.  No pneumoperitoneum.  Musculoskeletal: There are no aggressive appearing lytic or blastic lesions noted in the visualized portions of the skeleton.  IMPRESSION: 1. Today's study demonstrates slight regression of 1 of the target and 1 of the non target lesions, as detailed above. No findings to suggest local recurrence of disease or new metastatic disease in the chest, abdomen or pelvis. 2. Aortic atherosclerosis, with new mild ectasia of the ascending thoracic aorta (4.0 cm in diameter), which is unchanged. Continued attention on future followup examinations is recommended to ensure the stability of this finding. 3. Cholelithiasis without evidence of acute  cholecystitis at this time. 4. Additional incidental findings, as above.  Electronically Signed: By: Vinnie Langton M.D. On: 10/29/2015 10:49       I personally reviewed the CT chest and reviewed the films with [name] as well.  Impression: 70 year old man with recurrent adenocarcinoma being treated with Tarceva. His most recent CT shows all lesions are stable or improved. There is no evidence of progression of disease.  It is hard to pin down his right flank/upper quadrant pain. He thinks he may sometimes be associated with food but is not 562% certain of that. I asked him to pay attention to that he does have a gallstone on his CT which could be the source of his discomfort. It is also possible his discomfort is related to his previous surgery. He would prefer not to have a cholecystectomy.  Plan: Return in 6 months.  Melrose Nakayama, MD [facility] [phone]

## 2015-11-19 ENCOUNTER — Ambulatory Visit (HOSPITAL_BASED_OUTPATIENT_CLINIC_OR_DEPARTMENT_OTHER): Payer: Medicare Other | Admitting: Internal Medicine

## 2015-11-19 ENCOUNTER — Other Ambulatory Visit (HOSPITAL_BASED_OUTPATIENT_CLINIC_OR_DEPARTMENT_OTHER): Payer: Medicare Other

## 2015-11-19 ENCOUNTER — Encounter: Payer: Self-pay | Admitting: Oncology

## 2015-11-19 ENCOUNTER — Encounter: Payer: Self-pay | Admitting: *Deleted

## 2015-11-19 ENCOUNTER — Encounter: Payer: Self-pay | Admitting: Internal Medicine

## 2015-11-19 ENCOUNTER — Other Ambulatory Visit (HOSPITAL_COMMUNITY)
Admission: RE | Admit: 2015-11-19 | Discharge: 2015-11-19 | Disposition: A | Discharge status: Home / Self Care | Payer: Medicare Other | Source: Ambulatory Visit | Attending: Internal Medicine | Admitting: Internal Medicine

## 2015-11-19 VITALS — BP 133/82 | HR 86 | Temp 97.8°F | Resp 18 | Ht 72.0 in | Wt 241.7 lb

## 2015-11-19 DIAGNOSIS — Z5111 Encounter for antineoplastic chemotherapy: Secondary | ICD-10-CM

## 2015-11-19 DIAGNOSIS — Z006 Encounter for examination for normal comparison and control in clinical research program: Secondary | ICD-10-CM

## 2015-11-19 DIAGNOSIS — C3431 Malignant neoplasm of lower lobe, right bronchus or lung: Secondary | ICD-10-CM

## 2015-11-19 DIAGNOSIS — Z79899 Other long term (current) drug therapy: Secondary | ICD-10-CM

## 2015-11-19 LAB — COMPREHENSIVE METABOLIC PANEL
ALBUMIN: 3.7 g/dL (ref 3.5–5.0)
ALK PHOS: 56 U/L (ref 40–150)
ALT: 25 U/L (ref 0–55)
AST: 28 U/L (ref 5–34)
Anion Gap: 7 mEq/L (ref 3–11)
BILIRUBIN TOTAL: 0.4 mg/dL (ref 0.20–1.20)
BUN: 14.6 mg/dL (ref 7.0–26.0)
CO2: 29 mEq/L (ref 22–29)
Calcium: 9.6 mg/dL (ref 8.4–10.4)
Chloride: 105 mEq/L (ref 98–109)
Creatinine: 1 mg/dL (ref 0.7–1.3)
EGFR: 77 mL/min/{1.73_m2} — ABNORMAL LOW (ref >90)
GLUCOSE: 118 mg/dL (ref 70–140)
Potassium: 3.6 mEq/L (ref 3.5–5.1)
SODIUM: 141 meq/L (ref 136–145)
TOTAL PROTEIN: 6.5 g/dL (ref 6.4–8.3)

## 2015-11-19 LAB — CBC WITH DIFFERENTIAL/PLATELET
BASO%: 0.8 % (ref 0.0–2.0)
Basophils Absolute: 0 10*3/uL (ref 0.0–0.1)
EOS ABS: 0.2 10*3/uL (ref 0.0–0.5)
EOS%: 3.2 % (ref 0.0–7.0)
HEMATOCRIT: 39.1 % (ref 38.4–49.9)
HEMOGLOBIN: 13.1 g/dL (ref 13.0–17.1)
LYMPH#: 1.8 10*3/uL (ref 0.9–3.3)
LYMPH%: 38.1 % (ref 14.0–49.0)
MCH: 32.7 pg (ref 27.2–33.4)
MCHC: 33.4 g/dL (ref 32.0–36.0)
MCV: 97.7 fL (ref 79.3–98.0)
MONO#: 0.5 10*3/uL (ref 0.1–0.9)
MONO%: 11.3 % (ref 0.0–14.0)
NEUT%: 46.6 % (ref 39.0–75.0)
NEUTROS ABS: 2.2 10*3/uL (ref 1.5–6.5)
Platelets: 220 10*3/uL (ref 140–400)
RBC: 4 10*6/uL — ABNORMAL LOW (ref 4.20–5.82)
RDW: 13.6 % (ref 11.0–14.6)
WBC: 4.7 10*3/uL (ref 4.0–10.3)

## 2015-11-19 LAB — MAGNESIUM: Magnesium: 2.4 mg/dl (ref 1.5–2.5)

## 2015-11-19 LAB — PHOSPHORUS: Phosphorus: 2.6 mg/dL (ref 2.5–4.6)

## 2015-11-19 LAB — LACTATE DEHYDROGENASE: LDH: 179 U/L (ref 125–245)

## 2015-11-19 NOTE — Progress Notes (Signed)
[facility] [phone]   [phone]  OFFICE PROGRESS NOTE  [name],[name], MD 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 Roxan Hockey on 01/23/2006.  2) Tarceva 150 mg by mouth daily as part of the BMS checkmate 370 clinical trial. Status post 6 weeks of treatment.   CURRENT THERAPY: Tarceva 100 mg by mouth daily as part of the BMS checkmate 370 clinical trial. Started 07/17/2014 Status post 16 months of treatment.  DISEASE STAGE: Recurrent  CHEMOTHERAPY INTENT: Palliative.  CURRENT # OF CHEMOTHERAPY CYCLES: 17  CURRENT ANTIEMETICS: None  CURRENT SMOKING STATUS: Non-smoker  ORAL CHEMOTHERAPY AND CONSENT: None  CURRENT BISPHOSPHONATES USE: None  LIVING WILL AND CODE STATUS: Full code.  INTERVAL HISTORY: [name] 70 y.o. male returns to the clinic today for followup visit accompanied by his wife. The patient is doing fine with no complaints today. Hysterectomy his treatment with Tarceva well. He denied having any skin rash or diarrhea. He denied having any significant chest pain, shortness of breath, cough or hemoptysis. The patient denied having any fever or chills, no vomiting, abdominal pain, diarrhea or constipation.   MEDICAL HISTORY: Past Medical History:  Diagnosis Date  . Cholelithiases 07/16/2015  . Complication of anesthesia 2007   quit breathing with bronchoscopy, had to spend night  . History of agent Orange exposure 44-45 yrs ago  . History of pneumonia  last 3-4 yrs ago   3 -4 different times  . Hypercholesterolemia   . Hypertension   . Hypothyroidism   . lung ca dx'd 2007   lung  right  . Sinus disease    hx of  . Sleep apnea    uses cpap setting opf 12    ALLERGIES:  is allergic to lisinopril.  MEDICATIONS:  Current Outpatient Prescriptions  Medication Sig Dispense Refill  . amitriptyline (ELAVIL) 50 MG tablet Take 50 mg by mouth at bedtime. Reported on 06/18/2015    . aspirin 81 MG tablet Take 81 mg by mouth every morning.     . Cholecalciferol 2000 units CAPS Take 1 capsule by mouth daily.    . clindamycin (CLEOCIN-T) 1 % external solution Apply topically 2 (two) times daily. 240 mL 2  . clindamycin (CLINDAGEL) 1 % gel Apply 1 application topically 2 (two) times daily.    [name] diltiazem (CARDIZEM CD) 360 MG 24 hr capsule Take 360 mg by mouth every morning.     . erlotinib (TARCEVA) 100 MG tablet Take 1 tablet (100 mg total) by mouth daily. Take on an empty stomach 1 hour before meals or 2 hours after.    . levETIRAcetam (KEPPRA) 750 MG tablet     . levothyroxine (SYNTHROID, LEVOTHROID) 88 MCG tablet Take 88 mcg by mouth daily before breakfast.    . metoCLOPramide (REGLAN) 10 MG tablet Take 10 mg by mouth every 6 (six) hours as needed for nausea.     . metoprolol succinate (TOPROL-XL) 25 MG 24 hr tablet Take 25 mg by mouth every morning.     . Multiple Vitamins-Minerals (CENTRUM SILVER PO) Take 1 tablet by mouth daily.     . naproxen sodium (ANAPROX) 550 MG tablet Take  550 mg by mouth 3 (three) times daily as needed for moderate pain. Reported on 08/27/2015    . nitroGLYCERIN (NITROSTAT) 0.4 MG SL tablet Place 0.4 mg under the tongue every 5 (five) minutes as needed for chest pain. Reported on 10/22/2015    . polyethylene glycol (MIRALAX / GLYCOLAX) packet Take 17 g by mouth daily. Reported on 08/27/2015    . psyllium (METAMUCIL) 58.6 % powder Take 1 packet by mouth 3 (three) times daily. Reported on 08/27/2015    . rosuvastatin (CRESTOR) 5 MG tablet Take 5 mg by mouth every morning.     . valsartan-hydrochlorothiazide (DIOVAN-HCT) 320-25 MG per tablet Take 1 tablet by  mouth every morning.      No current facility-administered medications for this visit.     SURGICAL HISTORY:  Past Surgical History:  Procedure Laterality Date  . COLONOSCOPY WITH PROPOFOL N/A 03/19/2013   Procedure: COLONOSCOPY WITH PROPOFOL;  Surgeon: Garlan Fair, MD;  Location: [facility];  Service: Endoscopy;  Laterality: N/A;  . Cystourethroscopy, [facility].  04/16/2010  . NASAL SINUS SURGERY  1987  . Right video-assisted thoracoscopy, right upper lobectomy and  01/23/2006  . The Olympus video bronchoscope was introduced via the right  12/21/2005  . TONSILLECTOMY  1957  . Torn medial and lateral menisci, left knee.  11/06/2006    REVIEW OF SYSTEMS:  A comprehensive review of systems was negative.   PHYSICAL EXAMINATION: General appearance: alert, cooperative and no distress Head: Normocephalic, without obvious abnormality, atraumatic Neck: no adenopathy Lymph nodes: Cervical, supraclavicular, and axillary nodes normal. Resp: clear to auscultation bilaterally Back: symmetric, no curvature. ROM normal. No CVA tenderness. Cardio: regular rate and rhythm, S1, S2 normal, no murmur, click, rub or gallop GI: soft, non-tender; bowel sounds normal; no masses,  no organomegaly Extremities: extremities normal, atraumatic, no cyanosis or edema Neurologic: Alert and oriented X 3, normal strength and tone. Normal symmetric reflexes. Normal coordination and gait    ECOG PERFORMANCE STATUS: 0 - Asymptomatic  Blood pressure 133/82, pulse 86, temperature 97.8 F (36.6 C), temperature source Oral, resp. rate 18, height 6' (1.829 m), weight 241 lb 11.2 oz (109.6 kg), SpO2 97 %.  LABORATORY DATA: Lab Results  Component Value Date   WBC 4.7 11/19/2015   HGB 13.1 11/19/2015   HCT 39.1 11/19/2015   MCV 97.7 11/19/2015   PLT 220 11/19/2015      Chemistry      Component Value Date/Time   NA 141 11/19/2015 1127   K 3.6 11/19/2015 1127   CL 107 07/17/2014 1000   CL 102  04/24/2012 0853   CO2 29 11/19/2015 1127   BUN 14.6 11/19/2015 1127   CREATININE 1.0 11/19/2015 1127      Component Value Date/Time   CALCIUM 9.6 11/19/2015 1127   ALKPHOS 56 11/19/2015 1127   AST 28 11/19/2015 1127   ALT 25 11/19/2015 1127   BILITOT 0.40 11/19/2015 1127       RADIOGRAPHIC STUDIES: Ct Chest W Contrast  Addendum Date: 11/04/2015   ADDENDUM REPORT: 11/04/2015 09:33 ADDENDUM: RECIST  correction: Target lesion: Target Lesion 2: RIGHT lower lobe lung nodule measures 1.3 cm (image 61, series 5) unchanged from 1.3 cm with mm. Electronically Signed   By: Suzy Bouchard M.D.   On: 11/04/2015 09:33   Result Date: 11/04/2015 CLINICAL DATA:  70 year old male with history of lung cancer. Restaging examination. EXAM: CT CHEST, ABDOMEN, AND PELVIS WITH CONTRAST TECHNIQUE: Multidetector CT imaging of the chest, abdomen and  pelvis was performed following the standard protocol during bolus administration of intravenous contrast. CONTRAST:  14m ISOVUE-300 IOPAMIDOL (ISOVUE-300) INJECTION 61% COMPARISON:  Multiple priors, most recently CT of the chest, abdomen and pelvis 09/03/2015. FINDINGS: RECIST 1.1 Target Lesions: 1. Right lower lobe pulmonary nodule (image 58 of series 5), 1.8 cm long axis (unchanged). 2. Right lower lobe pulmonary nodule (image 67 of series 5), 6 mm in short axis (decreased from 8 mm on prior study). Non-target Lesions: 1. Resolved sub solid nodule anteriorly in the right lower lobe. 2. Right lower lobe pulmonary nodule (image 73 of series 5), and 9 mm in long axis (decreased from 11 mm on prior study). CT CHEST FINDINGS Cardiovascular: Heart size is normal. There is no significant pericardial fluid, thickening or pericardial calcification. Aortic atherosclerosis (mild), with mild ectasia of the ascending thoracic aorta (4.0 cm in diameter), which is unchanged. No calcifications in the coronary arteries. Mediastinum/Nodes: No pathologically enlarged mediastinal or hilar  lymph nodes. Esophagus is unremarkable in appearance. No axillary lymphadenopathy. Lungs/Pleura: Status post right upper lobectomy. Compensatory hyperexpansion of the right middle and lower lobes. Right lower lobe pulmonary nodule (image 58 of series 5) measures 1.8 x 1.4 cm (unchanged when accounting for differences in measurement technique). Other previously noted right lower lobe pulmonary nodule has significantly decreased in size, currently measuring 6 x 4 mm (previously 8 x 6 mm on 09/03/2015). Right lower lobe pulmonary nodule (image 73 of series 5), decreased in size, currently 9 x 5 mm (previously 11 x 6 mm on 09/03/2015). No definite new suspicious appearing pulmonary nodules or masses are noted. No acute consolidative airspace disease. No pleural effusions. Musculoskeletal: There are no aggressive appearing lytic or blastic lesions noted in the visualized portions of the skeleton. CT ABDOMEN AND PELVIS FINDINGS Hepatobiliary: No cystic or solid hepatic lesions. No intra or extrahepatic biliary ductal dilatation. 2.5 cm rim calcified gallstone in the gallbladder. No findings to suggest an acute cholecystitis at this time. Pancreas: No pancreatic mass. No pancreatic ductal dilatation. No pancreatic or peripancreatic fluid or inflammatory changes. Spleen: Unremarkable. Adrenals/Urinary Tract: Exophytic 1.6 cm low-attenuation lesion in the anterior aspect of the interpolar region of the right kidney is compatible with a small simple cysts. Left kidney and bilateral adrenal glands are normal in appearance. Mild bilateral perinephric stranding (nonspecific) is similar to prior examinations. No hydroureteronephrosis. Urinary bladder is normal in appearance. Stomach/Bowel: Normal appearance of the stomach. No pathologic dilatation of small bowel or colon. Normal appendix. Vascular/Lymphatic: Aortic atherosclerosis, without evidence of aneurysm or dissection in the abdominal or pelvic vasculature. No  lymphadenopathy noted in the abdomen or pelvis. Reproductive: Prostate gland and seminal vesicles are unremarkable in appearance. Other: No significant volume of ascites.  No pneumoperitoneum. Musculoskeletal: There are no aggressive appearing lytic or blastic lesions noted in the visualized portions of the skeleton. IMPRESSION: 1. Today's study demonstrates slight regression of 1 of the target and 1 of the non target lesions, as detailed above. No findings to suggest local recurrence of disease or new metastatic disease in the chest, abdomen or pelvis. 2. Aortic atherosclerosis, with new mild ectasia of the ascending thoracic aorta (4.0 cm in diameter), which is unchanged. Continued attention on future followup examinations is recommended to ensure the stability of this finding. 3. Cholelithiasis without evidence of acute cholecystitis at this time. 4. Additional incidental findings, as above. Electronically Signed: By: DVinnie LangtonM.D. On: 10/29/2015 10:49   Ct Abdomen Pelvis W Contrast  Addendum Date: 11/04/2015  ADDENDUM REPORT: 11/04/2015 09:33 ADDENDUM: RECIST  correction: Target lesion: Target Lesion 2: RIGHT lower lobe lung nodule measures 1.3 cm (image 61, series 5) unchanged from 1.3 cm with mm. Electronically Signed   By: Suzy Bouchard M.D.   On: 11/04/2015 09:33   Result Date: 11/04/2015 CLINICAL DATA:  70 year old male with history of lung cancer. Restaging examination. EXAM: CT CHEST, ABDOMEN, AND PELVIS WITH CONTRAST TECHNIQUE: Multidetector CT imaging of the chest, abdomen and pelvis was performed following the standard protocol during bolus administration of intravenous contrast. CONTRAST:  1107m ISOVUE-300 IOPAMIDOL (ISOVUE-300) INJECTION 61% COMPARISON:  Multiple priors, most recently CT of the chest, abdomen and pelvis 09/03/2015. FINDINGS: RECIST 1.1 Target Lesions: 1. Right lower lobe pulmonary nodule (image 58 of series 5), 1.8 cm long axis (unchanged). 2. Right lower lobe  pulmonary nodule (image 67 of series 5), 6 mm in short axis (decreased from 8 mm on prior study). Non-target Lesions: 1. Resolved sub solid nodule anteriorly in the right lower lobe. 2. Right lower lobe pulmonary nodule (image 73 of series 5), and 9 mm in long axis (decreased from 11 mm on prior study). CT CHEST FINDINGS Cardiovascular: Heart size is normal. There is no significant pericardial fluid, thickening or pericardial calcification. Aortic atherosclerosis (mild), with mild ectasia of the ascending thoracic aorta (4.0 cm in diameter), which is unchanged. No calcifications in the coronary arteries. Mediastinum/Nodes: No pathologically enlarged mediastinal or hilar lymph nodes. Esophagus is unremarkable in appearance. No axillary lymphadenopathy. Lungs/Pleura: Status post right upper lobectomy. Compensatory hyperexpansion of the right middle and lower lobes. Right lower lobe pulmonary nodule (image 58 of series 5) measures 1.8 x 1.4 cm (unchanged when accounting for differences in measurement technique). Other previously noted right lower lobe pulmonary nodule has significantly decreased in size, currently measuring 6 x 4 mm (previously 8 x 6 mm on 09/03/2015). Right lower lobe pulmonary nodule (image 73 of series 5), decreased in size, currently 9 x 5 mm (previously 11 x 6 mm on 09/03/2015). No definite new suspicious appearing pulmonary nodules or masses are noted. No acute consolidative airspace disease. No pleural effusions. Musculoskeletal: There are no aggressive appearing lytic or blastic lesions noted in the visualized portions of the skeleton. CT ABDOMEN AND PELVIS FINDINGS Hepatobiliary: No cystic or solid hepatic lesions. No intra or extrahepatic biliary ductal dilatation. 2.5 cm rim calcified gallstone in the gallbladder. No findings to suggest an acute cholecystitis at this time. Pancreas: No pancreatic mass. No pancreatic ductal dilatation. No pancreatic or peripancreatic fluid or inflammatory  changes. Spleen: Unremarkable. Adrenals/Urinary Tract: Exophytic 1.6 cm low-attenuation lesion in the anterior aspect of the interpolar region of the right kidney is compatible with a small simple cysts. Left kidney and bilateral adrenal glands are normal in appearance. Mild bilateral perinephric stranding (nonspecific) is similar to prior examinations. No hydroureteronephrosis. Urinary bladder is normal in appearance. Stomach/Bowel: Normal appearance of the stomach. No pathologic dilatation of small bowel or colon. Normal appendix. Vascular/Lymphatic: Aortic atherosclerosis, without evidence of aneurysm or dissection in the abdominal or pelvic vasculature. No lymphadenopathy noted in the abdomen or pelvis. Reproductive: Prostate gland and seminal vesicles are unremarkable in appearance. Other: No significant volume of ascites.  No pneumoperitoneum. Musculoskeletal: There are no aggressive appearing lytic or blastic lesions noted in the visualized portions of the skeleton. IMPRESSION: 1. Today's study demonstrates slight regression of 1 of the target and 1 of the non target lesions, as detailed above. No findings to suggest local recurrence of disease or new metastatic  disease in the chest, abdomen or pelvis. 2. Aortic atherosclerosis, with new mild ectasia of the ascending thoracic aorta (4.0 cm in diameter), which is unchanged. Continued attention on future followup examinations is recommended to ensure the stability of this finding. 3. Cholelithiasis without evidence of acute cholecystitis at this time. 4. Additional incidental findings, as above. Electronically Signed: By: Vinnie Langton M.D. On: 10/29/2015 10:49   ASSESSMENT AND PLAN: This is a very pleasant 70 years old white male with recurrent non-small cell lung cancer, adenocarcinoma with positive EGFR mutation in exon 21 that was initially diagnosed as stage IA non-small cell lung cancer status post right upper lobectomy and has been observation since  October of 2007. He is status post 6 weeks treatment with Tarceva 150 mg by mouth daily according to the BMS checkmate 370 clinical trial. He is currently on Tarceva 100 mg by mouth daily Status post 16 months and and tolerating it fairly well with no significant adverse effects. I recommended for the patient to continue his current treatment with Tarceva at 100 mg by mouth daily. He would come back for follow-up visit in 2 weeks for reevaluation with repeat blood work according to the protocol. The patient was advised to call immediately if he has any other concerning symptoms in the interval.  All questions were answered. The patient knows to call the clinic with any problems, questions or concerns. We can certainly see the patient much sooner if necessary.  Disclaimer: This note was dictated with voice recognition software. Similar sounding words can inadvertently be transcribed and may not be corrected upon review.

## 2015-11-19 NOTE — Progress Notes (Signed)
11/19/15 - BMS [id number] - Questionnaires (PRO's) - Patient into the cancer center for routine visit.  Patient given PRO's upon arrival to the cancer center.  Patient completed PRO's ([id number] first and then the FACT-L) before any study procedures were done. The PRO's were checked for completeness.  The patient was thanked for his continued support in this clinical trial. Remer Macho 11/19/15 - 11:45 am

## 2015-11-19 NOTE — Progress Notes (Signed)
11/19/2015 1215 BMS 370 cycle 39, week 76 The patient comes in to the clinic accompanied by his wife.  He completed the FACT-L and EQ-5 without difficulty.  He had labs drawn, weight and resting vitals obtained, including resting pulse ox. The patient stated he continues to take erlotinib 100 mg daily and that he has not taken any new medications.  He reports no significant changes since the last visit.  He denies any new changes in his condition.  His rash remains the same.  He continues to walk daily and has mild dyspnea on exertion.  He has mild fatigue.  He denies pain.  His toe nail fungus remains the same. Dr. [name] was in to see patient and he performed a targeted physical exam.  Based on lab results review and exam, Dr. [name] cleared the patient to continue with the same treatment, erlotinib 100 mg daily. The patient understands to call MD for any problems or changes in his condition. Marcellus Scott, RN, BSN, MHA, OCN

## 2015-11-21 ENCOUNTER — Encounter: Payer: Self-pay | Admitting: Internal Medicine

## 2015-11-25 MED FILL — TARCEVA 100 MG TABLET: 100 | 30 days supply | Qty: 30 | Fill #2

## 2015-12-02 ENCOUNTER — Encounter: Payer: Self-pay | Admitting: Internal Medicine

## 2015-12-02 ENCOUNTER — Encounter: Payer: Self-pay | Admitting: *Deleted

## 2015-12-02 DIAGNOSIS — C3431 Malignant neoplasm of lower lobe, right bronchus or lung: Secondary | ICD-10-CM

## 2015-12-02 DIAGNOSIS — Z79899 Other long term (current) drug therapy: Secondary | ICD-10-CM

## 2015-12-02 MED ORDER — ERLOTINIB HCL 100 MG PO TABS: 100.0000 mg | ORAL_TABLET | Freq: Every day | ORAL | Status: DC

## 2015-12-03 ENCOUNTER — Other Ambulatory Visit (HOSPITAL_BASED_OUTPATIENT_CLINIC_OR_DEPARTMENT_OTHER): Payer: Medicare Other

## 2015-12-03 ENCOUNTER — Ambulatory Visit (HOSPITAL_BASED_OUTPATIENT_CLINIC_OR_DEPARTMENT_OTHER): Payer: Medicare Other | Admitting: Internal Medicine

## 2015-12-03 ENCOUNTER — Other Ambulatory Visit (HOSPITAL_COMMUNITY)
Admission: AD | Admit: 2015-12-03 | Discharge: 2015-12-03 | Disposition: A | Discharge status: Home / Self Care | Payer: Medicare Other | Source: Ambulatory Visit | Attending: Internal Medicine | Admitting: Internal Medicine

## 2015-12-03 ENCOUNTER — Telehealth: Payer: Self-pay | Admitting: Internal Medicine

## 2015-12-03 ENCOUNTER — Encounter: Payer: Self-pay | Admitting: Internal Medicine

## 2015-12-03 VITALS — BP 114/70 | HR 64 | Temp 97.8°F | Resp 17 | Ht 72.0 in | Wt 241.0 lb

## 2015-12-03 DIAGNOSIS — C3431 Malignant neoplasm of lower lobe, right bronchus or lung: Secondary | ICD-10-CM

## 2015-12-03 DIAGNOSIS — Z79899 Other long term (current) drug therapy: Secondary | ICD-10-CM

## 2015-12-03 DIAGNOSIS — Z006 Encounter for examination for normal comparison and control in clinical research program: Secondary | ICD-10-CM

## 2015-12-03 LAB — CBC WITH DIFFERENTIAL/PLATELET
BASO%: 0.7 % (ref 0.0–2.0)
Basophils Absolute: 0 10*3/uL (ref 0.0–0.1)
EOS%: 2.9 % (ref 0.0–7.0)
Eosinophils Absolute: 0.2 10*3/uL (ref 0.0–0.5)
HCT: 37.8 % — ABNORMAL LOW (ref 38.4–49.9)
HGB: 12.7 g/dL — ABNORMAL LOW (ref 13.0–17.1)
LYMPH%: 41 % (ref 14.0–49.0)
MCH: 32.6 pg (ref 27.2–33.4)
MCHC: 33.5 g/dL (ref 32.0–36.0)
MCV: 97.3 fL (ref 79.3–98.0)
MONO#: 0.6 10*3/uL (ref 0.1–0.9)
MONO%: 10.9 % (ref 0.0–14.0)
NEUT#: 2.3 10*3/uL (ref 1.5–6.5)
NEUT%: 44.5 % (ref 39.0–75.0)
PLATELETS: 218 10*3/uL (ref 140–400)
RBC: 3.89 10*6/uL — AB (ref 4.20–5.82)
RDW: 13.3 % (ref 11.0–14.6)
WBC: 5.2 10*3/uL (ref 4.0–10.3)
lymph#: 2.1 10*3/uL (ref 0.9–3.3)

## 2015-12-03 LAB — COMPREHENSIVE METABOLIC PANEL
ALT: 25 U/L (ref 0–55)
ANION GAP: 9 meq/L (ref 3–11)
AST: 28 U/L (ref 5–34)
Albumin: 3.8 g/dL (ref 3.5–5.0)
Alkaline Phosphatase: 52 U/L (ref 40–150)
BILIRUBIN TOTAL: 0.41 mg/dL (ref 0.20–1.20)
BUN: 15.6 mg/dL (ref 7.0–26.0)
CHLORIDE: 105 meq/L (ref 98–109)
CO2: 29 meq/L (ref 22–29)
CREATININE: 1.1 mg/dL (ref 0.7–1.3)
Calcium: 10.1 mg/dL (ref 8.4–10.4)
EGFR: 68 mL/min/{1.73_m2} — ABNORMAL LOW (ref >90)
GLUCOSE: 104 mg/dL (ref 70–140)
Potassium: 4.2 mEq/L (ref 3.5–5.1)
SODIUM: 142 meq/L (ref 136–145)
TOTAL PROTEIN: 6.6 g/dL (ref 6.4–8.3)

## 2015-12-03 LAB — LACTATE DEHYDROGENASE: LDH: 185 U/L (ref 125–245)

## 2015-12-03 LAB — PHOSPHORUS: Phosphorus: 4 mg/dL (ref 2.5–4.6)

## 2015-12-03 LAB — TSH: TSH: 5.394 m[IU]/L — AB (ref 0.320–4.118)

## 2015-12-03 LAB — MAGNESIUM: MAGNESIUM: 2.5 mg/dL (ref 1.5–2.5)

## 2015-12-03 NOTE — Progress Notes (Signed)
[place] Telephone:([phone]   Fax:(336) [phone]  OFFICE PROGRESS NOTE  [name],[name], MD 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 Roxan Hockey on 01/23/2006.  2) Tarceva 150 mg by mouth daily as part of the BMS checkmate 370 clinical trial. Status post 6 weeks of treatment.   CURRENT THERAPY: Tarceva 100 mg by mouth daily as part of the BMS checkmate 370 clinical trial. Started 07/17/2014 Status post 17 months of treatment.  DISEASE STAGE: Recurrent  CHEMOTHERAPY INTENT: Palliative.  CURRENT # OF CHEMOTHERAPY CYCLES: 18  CURRENT ANTIEMETICS: None  CURRENT SMOKING STATUS: Non-smoker  ORAL CHEMOTHERAPY AND CONSENT: None  CURRENT BISPHOSPHONATES USE: None  LIVING WILL AND CODE STATUS: Full code.  INTERVAL HISTORY: [name] 70 y.o. male returns to the clinic today for followup visit accompanied by his wife. The patient is doing fine with no complaints today. No significant changes since his last visit. He continues to tolerate his treatment was Tarceva well. He denied having any skin rash or diarrhea. He denied having any significant chest pain, shortness of breath, cough or hemoptysis. The patient denied having any fever or chills, no vomiting, abdominal pain, diarrhea or constipation. He had repeat blood work earlier today and he is here for evaluation and discussion of his lab results.  MEDICAL HISTORY: Past Medical History:  Diagnosis Date  . Cholelithiases 07/16/2015  . Complication of anesthesia 2007   quit breathing with bronchoscopy, had to spend night  . History of agent Orange exposure 44-45 yrs ago  .  History of pneumonia  last 3-4 yrs ago   3 -4 different times  . Hypercholesterolemia   . Hypertension   . Hypothyroidism   . lung ca dx'd 2007   lung right  . Sinus disease    hx of  . Sleep apnea    uses cpap setting opf 12    ALLERGIES:  is allergic to lisinopril.  MEDICATIONS:  Current Outpatient Prescriptions  Medication Sig Dispense Refill  . amitriptyline (ELAVIL) 50 MG tablet Take 50 mg by mouth at bedtime. Reported on 06/18/2015    . aspirin 81 MG tablet Take 81 mg by mouth every morning.     . Cholecalciferol 2000 units CAPS Take 1 capsule by mouth daily.    . clindamycin (CLEOCIN-T) 1 % external solution Apply topically 2 (two) times daily. 240 mL 2  . clindamycin (CLINDAGEL) 1 % gel Apply 1 application topically 2 (two) times daily.    [name] diltiazem (CARDIZEM CD) 360 MG 24 hr capsule Take 360 mg by mouth every morning.     . erlotinib (TARCEVA) 100 MG tablet Take 1 tablet (100 mg total) by mouth daily. Take on an empty stomach 1 hour before meals or 2 hours after.    . levETIRAcetam (KEPPRA) 750 MG tablet     . levothyroxine (SYNTHROID, LEVOTHROID) 88 MCG tablet Take 88 mcg by mouth daily before breakfast.    . metoCLOPramide (REGLAN) 10 MG tablet Take 10 mg by mouth every 6 (six) hours as needed for nausea.     . metoprolol succinate (TOPROL-XL) 25 MG 24 hr tablet Take 25 mg by mouth every morning.     [name]  Multiple Vitamins-Minerals (CENTRUM SILVER PO) Take 1 tablet by mouth daily.     . polyethylene glycol (MIRALAX / GLYCOLAX) packet Take 17 g by mouth daily. Reported on 08/27/2015    . psyllium (METAMUCIL) 58.6 % powder Take 1 packet by mouth 3 (three) times daily. Reported on 08/27/2015    . rosuvastatin (CRESTOR) 5 MG tablet Take 5 mg by mouth every morning.     . valsartan-hydrochlorothiazide (DIOVAN-HCT) 320-25 MG per tablet Take 1 tablet by mouth every morning.     . naproxen sodium (ANAPROX) 550 MG tablet Take 550 mg by mouth 3 (three) times daily as needed for  moderate pain. Reported on 08/27/2015    . nitroGLYCERIN (NITROSTAT) 0.4 MG SL tablet Place 0.4 mg under the tongue every 5 (five) minutes as needed for chest pain. Reported on 10/22/2015     No current facility-administered medications for this visit.     SURGICAL HISTORY:  Past Surgical History:  Procedure Laterality Date  . COLONOSCOPY WITH PROPOFOL N/A 03/19/2013   Procedure: COLONOSCOPY WITH PROPOFOL;  Surgeon: Garlan Fair, MD;  Location: [facility];  Service: Endoscopy;  Laterality: N/A;  . Cystourethroscopy, [facility].  04/16/2010  . NASAL SINUS SURGERY  1987  . Right video-assisted thoracoscopy, right upper lobectomy and  01/23/2006  . The Olympus video bronchoscope was introduced via the right  12/21/2005  . TONSILLECTOMY  1957  . Torn medial and lateral menisci, left knee.  11/06/2006    REVIEW OF SYSTEMS:  A comprehensive review of systems was negative.   PHYSICAL EXAMINATION: General appearance: alert, cooperative and no distress Head: Normocephalic, without obvious abnormality, atraumatic Neck: no adenopathy Lymph nodes: Cervical, supraclavicular, and axillary nodes normal. Resp: clear to auscultation bilaterally Back: symmetric, no curvature. ROM normal. No CVA tenderness. Cardio: regular rate and rhythm, S1, S2 normal, no murmur, click, rub or gallop GI: soft, non-tender; bowel sounds normal; no masses,  no organomegaly Extremities: extremities normal, atraumatic, no cyanosis or edema Neurologic: Alert and oriented X 3, normal strength and tone. Normal symmetric reflexes. Normal coordination and gait    ECOG PERFORMANCE STATUS: 0 - Asymptomatic  Blood pressure 114/70, pulse 64, temperature 97.8 F (36.6 C), temperature source Oral, resp. rate 17, height 6' (1.829 m), weight 241 lb (109.3 kg), SpO2 97 %.  LABORATORY DATA: Lab Results  Component Value Date   WBC 5.2 12/03/2015   HGB 12.7 (L) 12/03/2015   HCT 37.8 (L) 12/03/2015   MCV 97.3 12/03/2015    PLT 218 12/03/2015      Chemistry      Component Value Date/Time   NA 142 12/03/2015 0841   K 4.2 12/03/2015 0841   CL 107 07/17/2014 1000   CL 102 04/24/2012 0853   CO2 29 12/03/2015 0841   BUN 15.6 12/03/2015 0841   CREATININE 1.1 12/03/2015 0841      Component Value Date/Time   CALCIUM 10.1 12/03/2015 0841   ALKPHOS 52 12/03/2015 0841   AST 28 12/03/2015 0841   ALT 25 12/03/2015 0841   BILITOT 0.41 12/03/2015 0841       RADIOGRAPHIC STUDIES: No results found. ASSESSMENT AND PLAN: This is a very pleasant 70 years old white male with recurrent non-small cell lung cancer, adenocarcinoma with positive EGFR mutation in exon 21 that was initially diagnosed as stage IA non-small cell lung cancer status post right upper lobectomy and has been observation since October of 2007. He is status post 6 weeks treatment with Tarceva 150 mg by  mouth daily according to the BMS checkmate 370 clinical trial. He is currently on Tarceva 100 mg by mouth daily Status post 17 months and and tolerating it fairly well with no significant adverse effects. I recommended for the patient to continue his current treatment with Tarceva at 100 mg by mouth daily. He would come back for follow-up visit in 2 weeks for reevaluation with repeat blood work according to the protocol. The patient was advised to call immediately if he has any other concerning symptoms in the interval.  All questions were answered. The patient knows to call the clinic with any problems, questions or concerns. We can certainly see the patient much sooner if necessary.  Disclaimer: This note was dictated with voice recognition software. Similar sounding words can inadvertently be transcribed and may not be corrected upon review.

## 2015-12-03 NOTE — Telephone Encounter (Signed)
GAVE PATIENT AVS REPORT AND APPOINTMENTS FOR September AND October  °

## 2015-12-03 NOTE — Progress Notes (Signed)
12/03/2015 0930 BMS 370 cycle 40, week 78 The patient returns to the clinic today with his wife.  Vital signs and pulse oximetry were obtained at rest.  The patient states he has had a good last 2 weeks.  He denies taking any new medications and reports not missing any of his erlotinib doses.  He reports having no changes to his overall condition.  His rash, fatigue, dyspnea, and right toe fungus are unchanged. Dr. [name] was in to see patient and to perform a targeted physical exam.  Based on lab results review and exam, the patient was cleared by MD to continue treatment. Due to elevated TSH today, reflex T3 and T4 tests have been ordered for further evaluation. The patient was reminded to call if there are any changes in his condition. Marcellus Scott, RN, BSN, MHA, OCN  12/04/2015 [phone] The patient's T3 and T4 results from 11/05/15 were within normal limits.   Dr. [name] was notified of the T3 and T4 results. 12/04/2015   .

## 2015-12-04 LAB — T4, FREE: T4,Free(Direct): 1.27 ng/dL (ref 0.82–1.77)

## 2015-12-04 LAB — T3, FREE: Triiodothyronine,Free,Serum: 2.5 pg/mL (ref 2.0–4.4)

## 2015-12-08 ENCOUNTER — Telehealth: Payer: Self-pay | Admitting: *Deleted

## 2015-12-08 NOTE — Telephone Encounter (Signed)
12/08/2015 0858 The patient's TSH was abnormal 12/03/15. Reflex T3 and T4 came back normal on 12/04/15 and Dr. [name] was notified. Was able to reach patient by phone today.  He was instructed to notify hid primary physician of the elevated TSH level per request of Dr. [name].  Patient verbalized understanding stating he would contact Dr. Inda Merlin.  The patient thanked this RN for the call.

## 2015-12-16 ENCOUNTER — Encounter: Payer: Self-pay | Admitting: *Deleted

## 2015-12-16 ENCOUNTER — Encounter: Payer: Self-pay | Admitting: Internal Medicine

## 2015-12-16 DIAGNOSIS — C3431 Malignant neoplasm of lower lobe, right bronchus or lung: Secondary | ICD-10-CM

## 2015-12-16 MED ORDER — ERLOTINIB HCL 100 MG PO TABS: 100.0000 mg | ORAL_TABLET | Freq: Every day | ORAL | Status: DC

## 2015-12-17 ENCOUNTER — Telehealth: Payer: Self-pay

## 2015-12-17 ENCOUNTER — Other Ambulatory Visit (HOSPITAL_BASED_OUTPATIENT_CLINIC_OR_DEPARTMENT_OTHER): Payer: Medicare Other

## 2015-12-17 ENCOUNTER — Other Ambulatory Visit (HOSPITAL_COMMUNITY)
Admission: AD | Admit: 2015-12-17 | Discharge: 2015-12-17 | Disposition: A | Discharge status: Home / Self Care | Payer: Medicare Other | Source: Ambulatory Visit | Attending: Internal Medicine | Admitting: Internal Medicine

## 2015-12-17 ENCOUNTER — Ambulatory Visit (HOSPITAL_BASED_OUTPATIENT_CLINIC_OR_DEPARTMENT_OTHER): Payer: Medicare Other | Admitting: Internal Medicine

## 2015-12-17 ENCOUNTER — Telehealth: Payer: Self-pay | Admitting: Internal Medicine

## 2015-12-17 VITALS — BP 116/65 | HR 62 | Temp 97.5°F | Resp 17 | Ht 72.0 in | Wt 243.0 lb

## 2015-12-17 DIAGNOSIS — C3431 Malignant neoplasm of lower lobe, right bronchus or lung: Secondary | ICD-10-CM

## 2015-12-17 DIAGNOSIS — Z006 Encounter for examination for normal comparison and control in clinical research program: Secondary | ICD-10-CM

## 2015-12-17 DIAGNOSIS — Z23 Encounter for immunization: Secondary | ICD-10-CM | POA: Diagnosis not present

## 2015-12-17 DIAGNOSIS — C3490 Malignant neoplasm of unspecified part of unspecified bronchus or lung: Secondary | ICD-10-CM | POA: Diagnosis not present

## 2015-12-17 DIAGNOSIS — Z79899 Other long term (current) drug therapy: Secondary | ICD-10-CM

## 2015-12-17 LAB — COMPREHENSIVE METABOLIC PANEL
ALT: 28 U/L (ref 0–55)
ANION GAP: 7 meq/L (ref 3–11)
AST: 27 U/L (ref 5–34)
Albumin: 3.7 g/dL (ref 3.5–5.0)
Alkaline Phosphatase: 60 U/L (ref 40–150)
BILIRUBIN TOTAL: 0.41 mg/dL (ref 0.20–1.20)
BUN: 14.3 mg/dL (ref 7.0–26.0)
CO2: 28 meq/L (ref 22–29)
CREATININE: 1 mg/dL (ref 0.7–1.3)
Calcium: 9.5 mg/dL (ref 8.4–10.4)
Chloride: 105 mEq/L (ref 98–109)
EGFR: 81 mL/min/{1.73_m2} — ABNORMAL LOW (ref >90)
Glucose: 81 mg/dl (ref 70–140)
Potassium: 3.8 mEq/L (ref 3.5–5.1)
Sodium: 141 mEq/L (ref 136–145)
TOTAL PROTEIN: 6.6 g/dL (ref 6.4–8.3)

## 2015-12-17 LAB — CBC WITH DIFFERENTIAL/PLATELET
BASO%: 0.6 % (ref 0.0–2.0)
Basophils Absolute: 0 10*3/uL (ref 0.0–0.1)
EOS%: 3.6 % (ref 0.0–7.0)
Eosinophils Absolute: 0.2 10*3/uL (ref 0.0–0.5)
HEMATOCRIT: 37.9 % — AB (ref 38.4–49.9)
HGB: 12.8 g/dL — ABNORMAL LOW (ref 13.0–17.1)
LYMPH#: 1.9 10*3/uL (ref 0.9–3.3)
LYMPH%: 35.6 % (ref 14.0–49.0)
MCH: 32.5 pg (ref 27.2–33.4)
MCHC: 33.8 g/dL (ref 32.0–36.0)
MCV: 96.4 fL (ref 79.3–98.0)
MONO#: 0.6 10*3/uL (ref 0.1–0.9)
MONO%: 11.7 % (ref 0.0–14.0)
NEUT%: 48.5 % (ref 39.0–75.0)
NEUTROS ABS: 2.6 10*3/uL (ref 1.5–6.5)
PLATELETS: 220 10*3/uL (ref 140–400)
RBC: 3.93 10*6/uL — ABNORMAL LOW (ref 4.20–5.82)
RDW: 13.5 % (ref 11.0–14.6)
WBC: 5.3 10*3/uL (ref 4.0–10.3)

## 2015-12-17 LAB — MAGNESIUM: MAGNESIUM: 2.3 mg/dL (ref 1.5–2.5)

## 2015-12-17 LAB — LACTATE DEHYDROGENASE: LDH: 176 U/L (ref 125–245)

## 2015-12-17 LAB — PHOSPHORUS: Phosphorus: 3 mg/dL (ref 2.5–4.6)

## 2015-12-17 MED ORDER — INFLUENZA VAC SPLIT QUAD 0.5 ML IM SUSY
0.5000 mL | PREFILLED_SYRINGE | Freq: Once | INTRAMUSCULAR | Status: AC
  Administered 2015-12-17: 0.5 mL via INTRAMUSCULAR
  Filled 2015-12-17: qty 0.5

## 2015-12-17 NOTE — Progress Notes (Signed)
12/17/2015 1215  BMS 370 cycle 41, week 80  The patient returns to the clinic today with his wife.  Vital signs and pulse oximetry were obtained at rest.  The patient states he has had a good last couple weeks.  He has been going to football games and continues to walk daily.  He denies taking any new medications and reports not missing any of his erlotinib doses.  He reports having no changes to his overall condition.  His rash, fatigue, dyspnea, and right toe fungus are unchanged.  He reports that he contacted Dr. Inda Merlin about his elevated TSH level on 12/03/15 and informed him the T3 and T4 were normal.  Patient stated Dr. Inda Merlin not concerned and will continue to monitor. Dr. [name] was in to see patient and to perform a targeted physical exam.  Based on lab results review and exam, the patient was cleared by MD to continue treatment. The patient was reminded to call if there are any changes in his condition.  The patient is aware of CT scan scheduled for 12/24/15. Marcellus Scott, RN, BSN, MHA, OCN

## 2015-12-17 NOTE — Progress Notes (Signed)
[place] Telephone:(336) [phone]   Fax:(336) [phone]  OFFICE PROGRESS NOTE  [name],[name], MD 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 Roxan Hockey on 01/23/2006.  2) Tarceva 150 mg by mouth daily as part of the BMS checkmate 370 clinical trial. Status post 6 weeks of treatment.   CURRENT THERAPY: Tarceva 100 mg by mouth daily as part of the BMS checkmate 370 clinical trial. Started 07/17/2014 Status post 18 months of treatment.  DISEASE STAGE: Recurrent  CHEMOTHERAPY INTENT: Palliative.   CURRENT # OF CHEMOTHERAPY CYCLES: 18  CURRENT ANTIEMETICS: None  CURRENT SMOKING STATUS: Non-smoker  ORAL CHEMOTHERAPY AND CONSENT: None  CURRENT BISPHOSPHONATES USE: None  LIVING WILL AND CODE STATUS: Full code.  INTERVAL HISTORY: [name] 70 y.o. male returns to the clinic today for followup visit accompanied by his wife. The patient is doing fine with no complaints today. He continues to tolerate his treatment was Tarceva well. He denied having any skin rash or diarrhea. He denied having any significant chest pain, shortness of breath, cough or hemoptysis. The patient denied having any fever or chills, no vomiting, abdominal pain, or constipation. He had repeat blood work earlier today and he is here for evaluation and discussion of his lab results.  MEDICAL HISTORY: Past Medical History:  Diagnosis Date  . Cholelithiases 07/16/2015  . Complication of anesthesia 2007   quit breathing with bronchoscopy, had to spend night  . History of agent Orange exposure 44-45 yrs ago  . History of pneumonia  last 3-4 yrs ago   3 -4  different times  . Hypercholesterolemia   . Hypertension   . Hypothyroidism   . lung ca dx'd 2007   lung right  . Sinus disease    hx of  . Sleep apnea    uses cpap setting opf 12    ALLERGIES:  is allergic to lisinopril.  MEDICATIONS:  Current Outpatient Prescriptions  Medication Sig Dispense Refill  . amitriptyline (ELAVIL) 50 MG tablet Take 50 mg by mouth at bedtime. Reported on 06/18/2015    . aspirin 81 MG tablet Take 81 mg by mouth every morning.     . Cholecalciferol 2000 units CAPS Take 1 capsule by mouth daily.    . clindamycin (CLEOCIN-T) 1 % external solution Apply topically 2 (two) times daily. 240 mL 2  . clindamycin (CLINDAGEL) 1 % gel Apply 1 application topically 2 (two) times daily.    [name] diltiazem (CARDIZEM CD) 360 MG 24 hr capsule Take 360 mg by mouth every morning.     . erlotinib (TARCEVA) 100 MG tablet Take 1 tablet (100 mg total) by mouth daily. Take on an empty stomach 1 hour before meals or 2 hours after.    . levETIRAcetam (KEPPRA) 750 MG tablet     . levothyroxine (SYNTHROID, LEVOTHROID) 88 MCG tablet Take 88 mcg by mouth daily before breakfast.    . metoCLOPramide (REGLAN) 10 MG tablet Take 10 mg by mouth every 6 (six) hours as needed for nausea.     . metoprolol succinate (TOPROL-XL) 25 MG 24 hr tablet Take 25 mg by mouth every morning.     . Multiple Vitamins-Minerals (  CENTRUM SILVER PO) Take 1 tablet by mouth daily.     . naproxen sodium (ANAPROX) 550 MG tablet Take 550 mg by mouth 3 (three) times daily as needed for moderate pain. Reported on 08/27/2015    . nitroGLYCERIN (NITROSTAT) 0.4 MG SL tablet Place 0.4 mg under the tongue every 5 (five) minutes as needed for chest pain. Reported on 10/22/2015    . polyethylene glycol (MIRALAX / GLYCOLAX) packet Take 17 g by mouth daily. Reported on 08/27/2015    . psyllium (METAMUCIL) 58.6 % powder Take 1 packet by mouth 3 (three) times daily. Reported on 08/27/2015    . rosuvastatin (CRESTOR) 5 MG tablet Take 5 mg  by mouth every morning.     . valsartan-hydrochlorothiazide (DIOVAN-HCT) 320-25 MG per tablet Take 1 tablet by mouth every morning.      No current facility-administered medications for this visit.     SURGICAL HISTORY:  Past Surgical History:  Procedure Laterality Date  . COLONOSCOPY WITH PROPOFOL N/A 03/19/2013   Procedure: COLONOSCOPY WITH PROPOFOL;  Surgeon: Garlan Fair, MD;  Location: [facility];  Service: Endoscopy;  Laterality: N/A;  . Cystourethroscopy, [facility].  04/16/2010  . NASAL SINUS SURGERY  1987  . Right video-assisted thoracoscopy, right upper lobectomy and  01/23/2006  . The Olympus video bronchoscope was introduced via the right  12/21/2005  . TONSILLECTOMY  1957  . Torn medial and lateral menisci, left knee.  11/06/2006    REVIEW OF SYSTEMS:  A comprehensive review of systems was negative.   PHYSICAL EXAMINATION: General appearance: alert, cooperative and no distress Head: Normocephalic, without obvious abnormality, atraumatic Neck: no adenopathy Lymph nodes: Cervical, supraclavicular, and axillary nodes normal. Resp: clear to auscultation bilaterally Back: symmetric, no curvature. ROM normal. No CVA tenderness. Cardio: regular rate and rhythm, S1, S2 normal, no murmur, click, rub or gallop GI: soft, non-tender; bowel sounds normal; no masses,  no organomegaly Extremities: extremities normal, atraumatic, no cyanosis or edema Neurologic: Alert and oriented X 3, normal strength and tone. Normal symmetric reflexes. Normal coordination and gait    ECOG PERFORMANCE STATUS: 0 - Asymptomatic  Blood pressure 116/65, pulse 62, temperature 97.5 F (36.4 C), temperature source Oral, resp. rate 17, height 6' (1.829 m), weight 243 lb (110.2 kg), SpO2 100 %.  LABORATORY DATA: Lab Results  Component Value Date   WBC 5.3 12/17/2015   HGB 12.8 (L) 12/17/2015   HCT 37.9 (L) 12/17/2015   MCV 96.4 12/17/2015   PLT 220 12/17/2015      Chemistry      Component  Value Date/Time   NA 141 12/17/2015 1122   K 3.8 12/17/2015 1122   CL 107 07/17/2014 1000   CL 102 04/24/2012 0853   CO2 28 12/17/2015 1122   BUN 14.3 12/17/2015 1122   CREATININE 1.0 12/17/2015 1122      Component Value Date/Time   CALCIUM 9.5 12/17/2015 1122   ALKPHOS 60 12/17/2015 1122   AST 27 12/17/2015 1122   ALT 28 12/17/2015 1122   BILITOT 0.41 12/17/2015 1122       RADIOGRAPHIC STUDIES: No results found. ASSESSMENT AND PLAN: This is a very pleasant 70 years old white male with recurrent non-small cell lung cancer, adenocarcinoma with positive EGFR mutation in exon 21 that was initially diagnosed as stage IA non-small cell lung cancer status post right upper lobectomy and has been observation since October of 2007. He is status post 6 weeks treatment with Tarceva 150 mg by mouth daily  according to the BMS checkmate 370 clinical trial. He is currently on Tarceva 100 mg by mouth daily Status post 18 months and and tolerating it fairly well with no significant adverse effects. I recommended for the patient to continue his current treatment with Tarceva at 100 mg by mouth daily. He would come back for follow-up visit in 2 weeks for reevaluation with repeat blood work As well as CT scan of the chest, abdomen and pelvis according to the protocol. The patient was advised to call immediately if he has any other concerning symptoms in the interval.  All questions were answered. The patient knows to call the clinic with any problems, questions or concerns. We can certainly see the patient much sooner if necessary.  Disclaimer: This note was dictated with voice recognition software. Similar sounding words can inadvertently be transcribed and may not be corrected upon review.

## 2015-12-17 NOTE — Telephone Encounter (Signed)
Gave patient avs report and appointments for September and October. Patient already on schedule for q2w lab/fu thru end of October.

## 2015-12-18 ENCOUNTER — Encounter: Payer: Self-pay | Admitting: Internal Medicine

## 2015-12-24 ENCOUNTER — Ambulatory Visit (HOSPITAL_COMMUNITY)
Admission: RE | Admit: 2015-12-24 | Discharge: 2015-12-24 | Disposition: A | Discharge status: Home / Self Care | Payer: Medicare Other | Source: Ambulatory Visit | Attending: Internal Medicine | Admitting: Internal Medicine

## 2015-12-24 ENCOUNTER — Encounter (HOSPITAL_COMMUNITY): Payer: Self-pay

## 2015-12-24 DIAGNOSIS — K8020 Calculus of gallbladder without cholecystitis without obstruction: Secondary | ICD-10-CM | POA: Insufficient documentation

## 2015-12-24 DIAGNOSIS — C3431 Malignant neoplasm of lower lobe, right bronchus or lung: Secondary | ICD-10-CM

## 2015-12-24 DIAGNOSIS — C3411 Malignant neoplasm of upper lobe, right bronchus or lung: Secondary | ICD-10-CM | POA: Diagnosis not present

## 2015-12-24 DIAGNOSIS — Z5111 Encounter for antineoplastic chemotherapy: Secondary | ICD-10-CM | POA: Insufficient documentation

## 2015-12-24 DIAGNOSIS — I700 Atherosclerosis of aorta: Secondary | ICD-10-CM | POA: Insufficient documentation

## 2015-12-24 MED ORDER — IOPAMIDOL (ISOVUE-300) INJECTION 61%
100.0000 mL | Freq: Once | INTRAVENOUS | Status: AC | PRN
  Administered 2015-12-24: 100 mL via INTRAVENOUS

## 2015-12-29 ENCOUNTER — Other Ambulatory Visit: Payer: Self-pay | Admitting: Internal Medicine

## 2015-12-29 MED FILL — TARCEVA 100 MG TABLET: 100 | 30 days supply | Qty: 30 | Fill #0

## 2015-12-30 ENCOUNTER — Encounter: Payer: Self-pay | Admitting: *Deleted

## 2015-12-30 ENCOUNTER — Encounter: Payer: Self-pay | Admitting: Internal Medicine

## 2015-12-30 DIAGNOSIS — Z79899 Other long term (current) drug therapy: Secondary | ICD-10-CM

## 2015-12-30 DIAGNOSIS — C3431 Malignant neoplasm of lower lobe, right bronchus or lung: Secondary | ICD-10-CM

## 2015-12-30 IMAGING — CT CT ABD-PELV W/ CM
2 of 5 series · 16 of 46 positions shown, 18 images · IV contrast (omnipaque)
Comparison: 11/25/2014

CLINICAL DATA: Non-small cell lung cancer.

EXAM:
CT CHEST, ABDOMEN, AND PELVIS WITH CONTRAST
TECHNIQUE: Multidetector CT imaging of the chest, abdomen and pelvis was
performed following the standard protocol during bolus
administration of intravenous contrast.
CONTRAST:  100mL OMNIPAQUE IOHEXOL 300 MG/ML  SOLN

[Series 2: cap with st · axial · 0.89mm/px · z∈[-716,-76]mm · 13 of 144 slices shown, 15 images]
[im 8/144  soft-tissue]
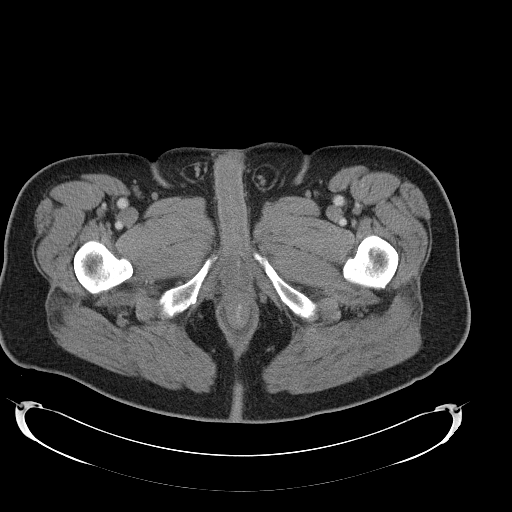
[im 8/144  bone]
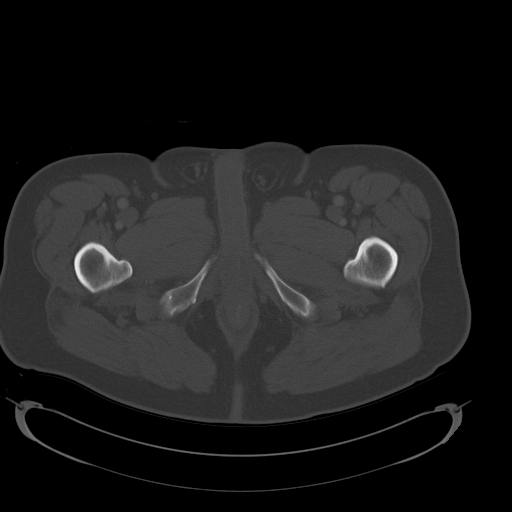
[im 16/144  soft-tissue]
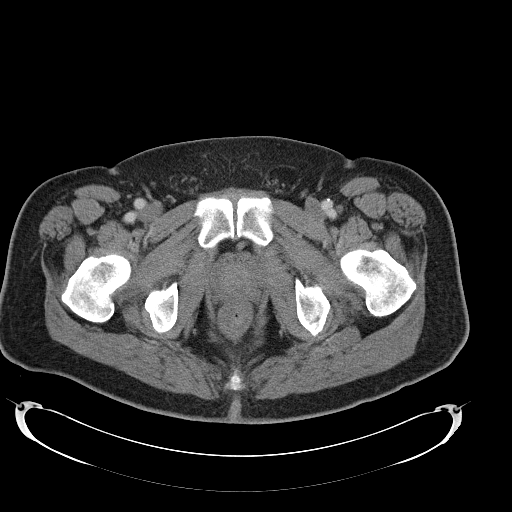
[im 32/144  soft-tissue]
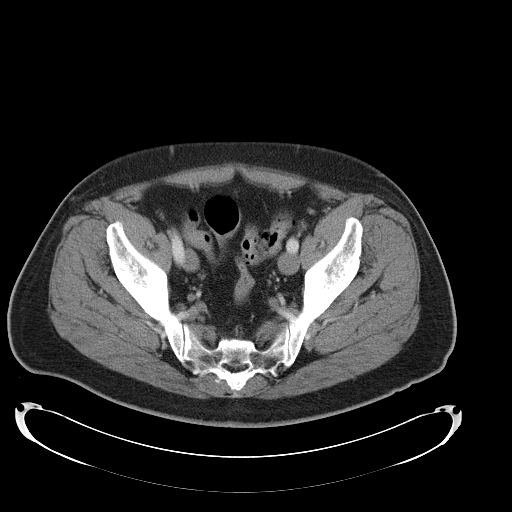
[im 40/144  soft-tissue]
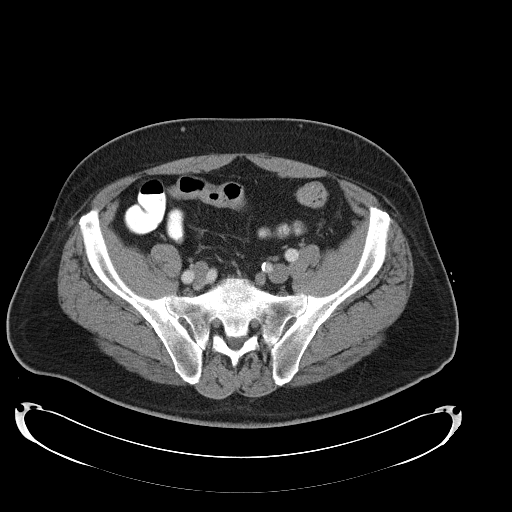
[im 48/144  soft-tissue]
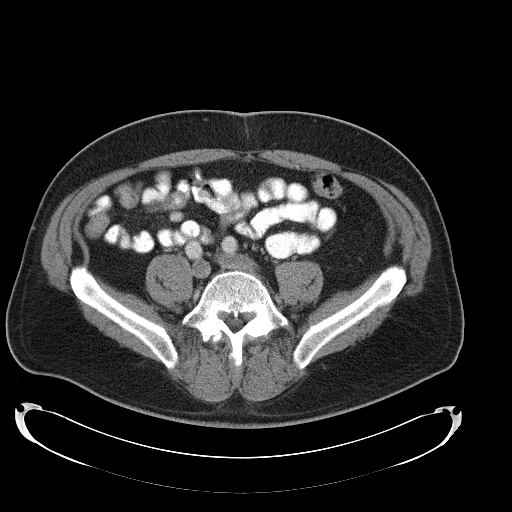
[im 64/144  soft-tissue]
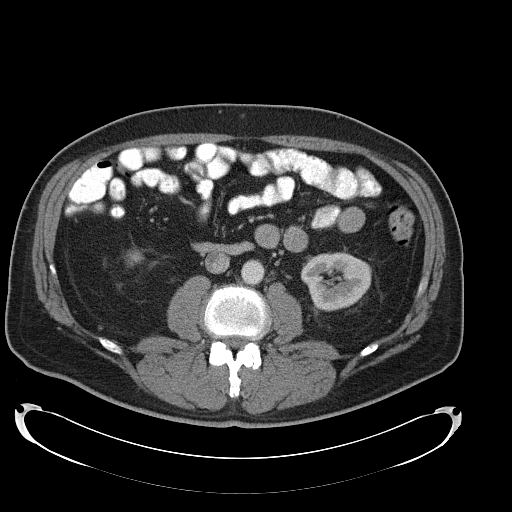
[im 72/144  soft-tissue]
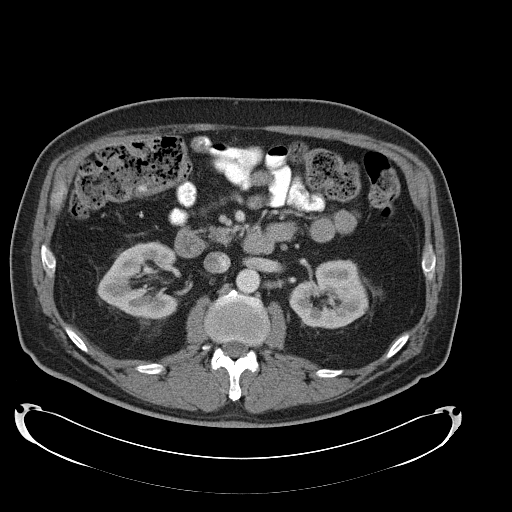
[im 80/144  soft-tissue]
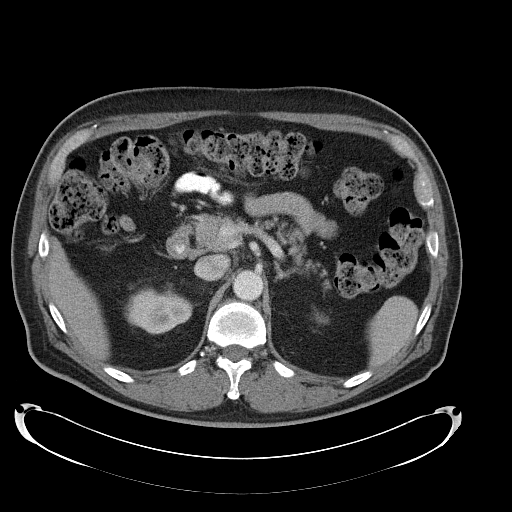
[im 96/144  soft-tissue]
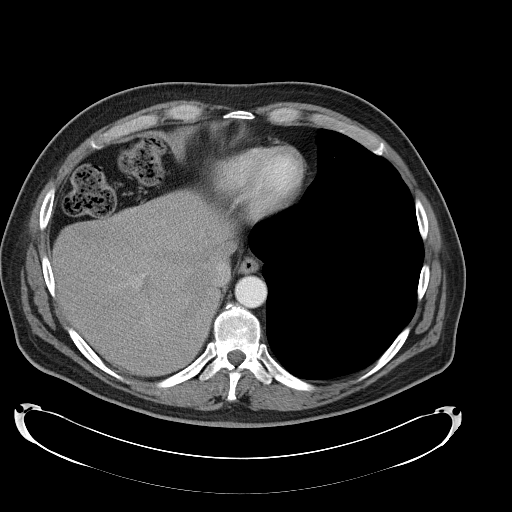
[im 96/144  bone]
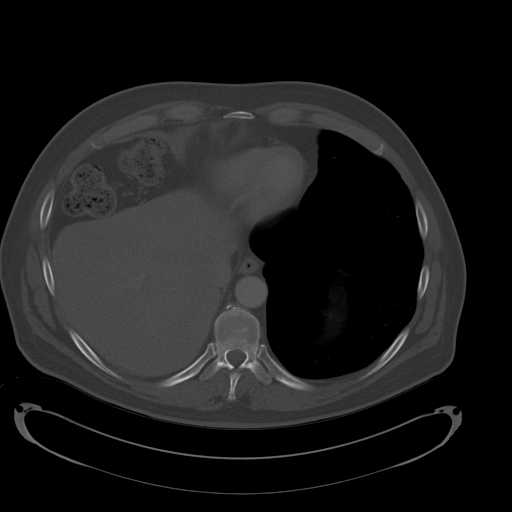
[im 104/144  soft-tissue]
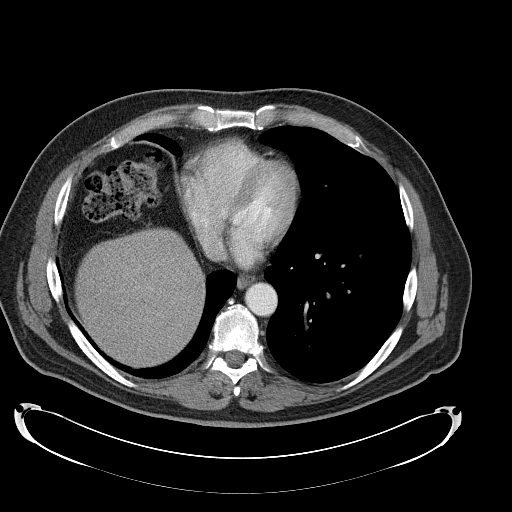
[im 112/144  soft-tissue]
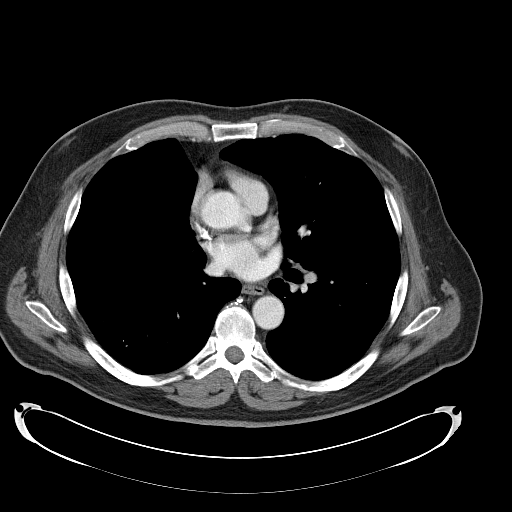
[im 128/144  soft-tissue]
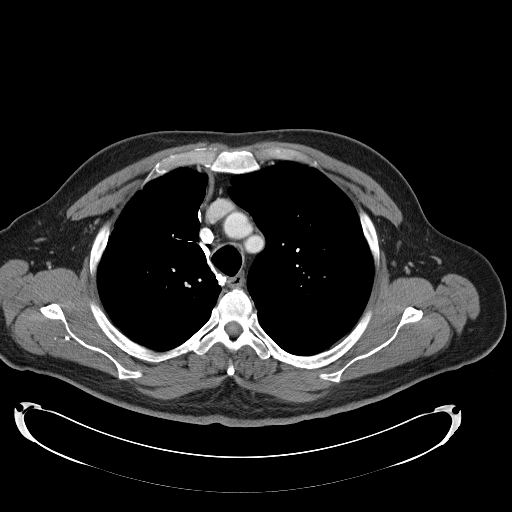
[im 136/144  soft-tissue]
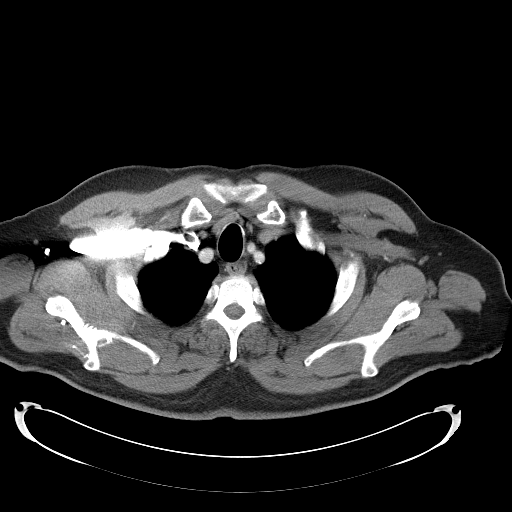

[Series 602: <mpr thick range> · coronal · 1.41mm/px · 3 of 112 slices shown]
[im 38/112  soft-tissue]
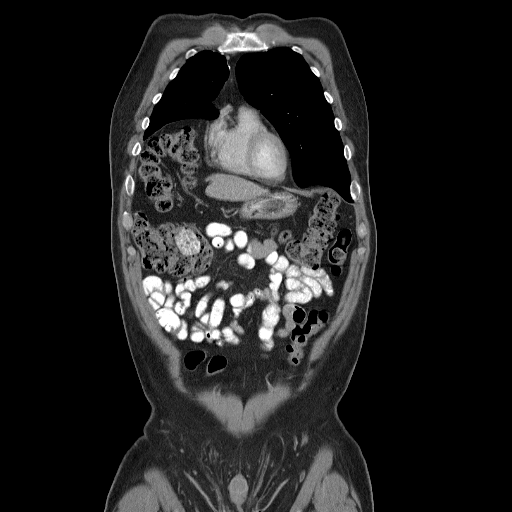
[im 50/112  soft-tissue]
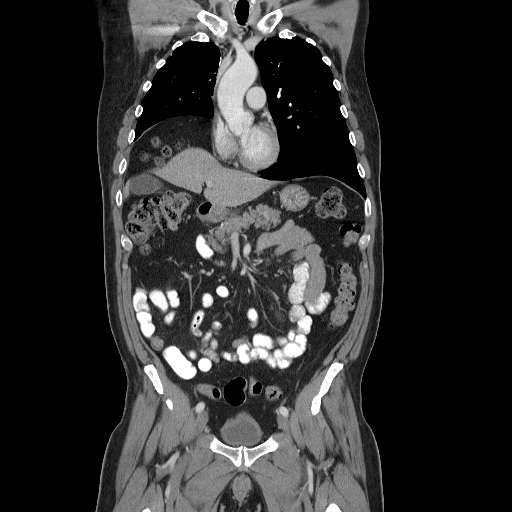
[im 62/112  soft-tissue]
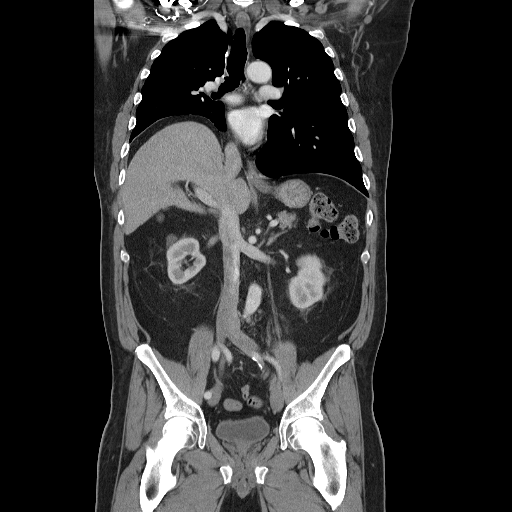

[16 of 46 positions shown; findings below may reference images not displayed]

RECIST 1.1:

TARGET LESIONS:

1. Right lower lobe lesion measures 1.8 cm, image 22 of series 5.
Previously 2.2 cm
2. Right lower lobe lung lesion measures 1.6 cm, image 25 of series
5. Previously 1.6 cm.

NON TARGET LESIONS:

1. Right lower lobe lung nodule measures 6 mm, image 26 of series 4.
Previously this measured the same.
FINDINGS: CT CHEST FINDINGS

Mediastinum/Nodes: Heart size is normal. No pericardial effusion.
Aortic atherosclerosis noted. The trachea is patent and is midline.
Normal appearance of the esophagus. No mediastinal or hilar
adenopathy identified. No axillary or supraclavicular adenopathy.

Lungs/Pleura: No pleural fluid. Stable postoperative changes
involving the right upper lung zone. No change in nodular densities
within the right lower lobe anteriorly as described above. No new
pulmonary nodules.

Musculoskeletal: There are no aggressive lytic or sclerotic bone
lesions.

CT ABDOMEN PELVIS FINDINGS

Hepatobiliary: Gallstone identified. Stone measures 2.6 cm. No
gallbladder wall thickening. No biliary dilatation.

Pancreas: Unremarkable appearance of the pancreas.

Spleen: The spleen is negative.

Adrenals/Urinary Tract: Normal appearance of the adrenal glands. The
left kidney is normal. Normal appearance of the right kidney.
Urinary bladder appears normal.

Stomach/Bowel: The stomach is normal. The small bowel loops have a
normal course and caliber without obstruction. The appendix is
visualized and appears normal. Normal appearance of the colon.

Vascular/Lymphatic: Normal appearance of the abdominal aorta. No
enlarged retroperitoneal or mesenteric adenopathy. No enlarged
pelvic or inguinal lymph nodes.

Reproductive: The prostate gland and seminal vesicles appear normal.

Other: There is no ascites or focal fluid collections within the
abdomen or pelvis.

Musculoskeletal: No aggressive lytic or sclerotic bone lesions
identified.
IMPRESSION: 1. Stable exam compared with 11/25/2014.
2. Nodular densities within the right lower lobe are not
significantly changed in the interval as described above.

## 2015-12-30 MED ORDER — ERLOTINIB HCL 100 MG PO TABS: 100.0000 mg | ORAL_TABLET | Freq: Every day | ORAL | Status: DC

## 2015-12-31 ENCOUNTER — Other Ambulatory Visit (HOSPITAL_COMMUNITY)
Admission: RE | Admit: 2015-12-31 | Discharge: 2015-12-31 | Disposition: A | Discharge status: Home / Self Care | Payer: Medicare Other | Source: Ambulatory Visit | Attending: Internal Medicine | Admitting: Internal Medicine

## 2015-12-31 ENCOUNTER — Encounter: Payer: Self-pay | Admitting: Internal Medicine

## 2015-12-31 ENCOUNTER — Ambulatory Visit (HOSPITAL_BASED_OUTPATIENT_CLINIC_OR_DEPARTMENT_OTHER): Payer: Medicare Other | Admitting: Internal Medicine

## 2015-12-31 ENCOUNTER — Other Ambulatory Visit (HOSPITAL_BASED_OUTPATIENT_CLINIC_OR_DEPARTMENT_OTHER): Payer: Medicare Other

## 2015-12-31 VITALS — BP 109/68 | HR 64 | Temp 97.7°F | Resp 16 | Ht 72.0 in | Wt 239.4 lb

## 2015-12-31 DIAGNOSIS — C3431 Malignant neoplasm of lower lobe, right bronchus or lung: Secondary | ICD-10-CM | POA: Insufficient documentation

## 2015-12-31 DIAGNOSIS — Z006 Encounter for examination for normal comparison and control in clinical research program: Secondary | ICD-10-CM

## 2015-12-31 DIAGNOSIS — Z5111 Encounter for antineoplastic chemotherapy: Secondary | ICD-10-CM

## 2015-12-31 LAB — CBC WITH DIFFERENTIAL/PLATELET
BASO%: 0.7 % (ref 0.0–2.0)
BASOS ABS: 0 10*3/uL (ref 0.0–0.1)
EOS ABS: 0.2 10*3/uL (ref 0.0–0.5)
EOS%: 3.4 % (ref 0.0–7.0)
HCT: 41.3 % (ref 38.4–49.9)
HEMOGLOBIN: 13.6 g/dL (ref 13.0–17.1)
LYMPH%: 38.2 % (ref 14.0–49.0)
MCH: 32.2 pg (ref 27.2–33.4)
MCHC: 33 g/dL (ref 32.0–36.0)
MCV: 97.7 fL (ref 79.3–98.0)
MONO#: 0.6 10*3/uL (ref 0.1–0.9)
MONO%: 11.1 % (ref 0.0–14.0)
NEUT#: 2.6 10*3/uL (ref 1.5–6.5)
NEUT%: 46.6 % (ref 39.0–75.0)
PLATELETS: 227 10*3/uL (ref 140–400)
RBC: 4.23 10*6/uL (ref 4.20–5.82)
RDW: 13.8 % (ref 11.0–14.6)
WBC: 5.5 10*3/uL (ref 4.0–10.3)
lymph#: 2.1 10*3/uL (ref 0.9–3.3)

## 2015-12-31 LAB — COMPREHENSIVE METABOLIC PANEL
ALBUMIN: 3.9 g/dL (ref 3.5–5.0)
ALK PHOS: 62 U/L (ref 40–150)
ALT: 27 U/L (ref 0–55)
ANION GAP: 9 meq/L (ref 3–11)
AST: 27 U/L (ref 5–34)
BILIRUBIN TOTAL: 0.47 mg/dL (ref 0.20–1.20)
BUN: 16.9 mg/dL (ref 7.0–26.0)
CO2: 26 mEq/L (ref 22–29)
CREATININE: 1.1 mg/dL (ref 0.7–1.3)
Calcium: 9.6 mg/dL (ref 8.4–10.4)
Chloride: 105 mEq/L (ref 98–109)
EGFR: 68 mL/min/{1.73_m2} — AB (ref >90)
GLUCOSE: 97 mg/dL (ref 70–140)
POTASSIUM: 3.8 meq/L (ref 3.5–5.1)
SODIUM: 140 meq/L (ref 136–145)
Total Protein: 7.1 g/dL (ref 6.4–8.3)

## 2015-12-31 LAB — LACTATE DEHYDROGENASE: LDH: 188 U/L (ref 125–245)

## 2015-12-31 LAB — MAGNESIUM: MAGNESIUM: 2.5 mg/dL (ref 1.5–2.5)

## 2015-12-31 LAB — PHOSPHORUS: PHOSPHORUS: 2.8 mg/dL (ref 2.5–4.6)

## 2015-12-31 NOTE — Progress Notes (Signed)
[facility] [phone]   [phone]  OFFICE PROGRESS NOTE  [name],[name], MD 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 Roxan Hockey on 01/23/2006.  2) Tarceva 150 mg by mouth daily as part of the BMS checkmate 370 clinical trial. Status post 6 weeks of treatment.   CURRENT THERAPY: Tarceva 100 mg by mouth daily as part of the BMS checkmate 370 clinical trial. Started 07/17/2014 Status post 18 months of treatment.  DISEASE STAGE: Recurrent  CHEMOTHERAPY INTENT: Palliative.   CURRENT # OF CHEMOTHERAPY CYCLES: 18  CURRENT ANTIEMETICS: None  CURRENT SMOKING STATUS: Non-smoker  ORAL CHEMOTHERAPY AND CONSENT: None  CURRENT BISPHOSPHONATES USE: None  LIVING WILL AND CODE STATUS: Full code.  INTERVAL HISTORY: [name] 70 y.o. male returns to the clinic today for followup visit accompanied by his wife. The patient is doing fine with no complaints today. He continues to tolerate his treatment with Tarceva well. He denied having any skin rash or diarrhea. He denied having any significant chest pain, shortness of breath, cough or hemoptysis. The patient denied having any fever or chills, no vomiting, abdominal pain, or constipation. He had repeat CT scan of the chest, abdomen and pelvis performed recently and he is here for evaluation and discussion of his scan results.  MEDICAL HISTORY: Past Medical History:  Diagnosis Date  . Cholelithiases 07/16/2015  . Complication of anesthesia 2007   quit breathing with bronchoscopy, had to spend night  . History of agent Orange exposure 44-45 yrs ago  . History of  pneumonia  last 3-4 yrs ago   3 -4 different times  . Hypercholesterolemia   . Hypertension   . Hypothyroidism   . lung ca dx'd 2007   lung right  . Sinus disease    hx of  . Sleep apnea    uses cpap setting opf 12    ALLERGIES:  is allergic to lisinopril.  MEDICATIONS:  Current Outpatient Prescriptions  Medication Sig Dispense Refill  . amitriptyline (ELAVIL) 50 MG tablet Take 50 mg by mouth at bedtime. Reported on 06/18/2015    . aspirin 81 MG tablet Take 81 mg by mouth every morning.     . Cholecalciferol 2000 units CAPS Take 1 capsule by mouth daily.    . clindamycin (CLEOCIN-T) 1 % external solution Apply topically 2 (two) times daily. 240 mL 2  . clindamycin (CLINDAGEL) 1 % gel Apply 1 application topically 2 (two) times daily.    [name] diltiazem (CARDIZEM CD) 360 MG 24 hr capsule Take 360 mg by mouth every morning.     . erlotinib (TARCEVA) 100 MG tablet Take 1 tablet (100 mg total) by mouth daily. Take on an empty stomach 1 hour before meals or 2 hours after.    . levETIRAcetam (KEPPRA) 750 MG tablet     . levothyroxine (SYNTHROID, LEVOTHROID) 88 MCG tablet Take 88 mcg by mouth daily before breakfast.    . metoprolol succinate (TOPROL-XL) 25 MG 24 hr tablet Take 25 mg by mouth every morning.     . Multiple Vitamins-Minerals (CENTRUM SILVER PO) Take 1 tablet by mouth daily.     . polyethylene glycol (MIRALAX /  GLYCOLAX) packet Take 17 g by mouth daily. Reported on 08/27/2015    . psyllium (METAMUCIL) 58.6 % powder Take 1 packet by mouth 3 (three) times daily. Reported on 08/27/2015    . rosuvastatin (CRESTOR) 5 MG tablet Take 5 mg by mouth every morning.     . valsartan-hydrochlorothiazide (DIOVAN-HCT) 320-25 MG per tablet Take 1 tablet by mouth every morning.     . metoCLOPramide (REGLAN) 10 MG tablet Take 10 mg by mouth every 6 (six) hours as needed for nausea.     . naproxen sodium (ANAPROX) 550 MG tablet Take 550 mg by mouth 3 (three) times daily as needed for moderate pain.  Reported on 08/27/2015    . nitroGLYCERIN (NITROSTAT) 0.4 MG SL tablet Place 0.4 mg under the tongue every 5 (five) minutes as needed for chest pain. Reported on 10/22/2015     No current facility-administered medications for this visit.     SURGICAL HISTORY:  Past Surgical History:  Procedure Laterality Date  . COLONOSCOPY WITH PROPOFOL N/A 03/19/2013   Procedure: COLONOSCOPY WITH PROPOFOL;  Surgeon: Garlan Fair, MD;  Location: [facility];  Service: Endoscopy;  Laterality: N/A;  . Cystourethroscopy, [facility] T[facility]P.  04/16/2010  . NASAL SINUS SURGERY  1987  . Right video-assisted thoracoscopy, right upper lobectomy and  01/23/2006  . The Olympus video bronchoscope was introduced via the right  12/21/2005  . TONSILLECTOMY  1957  . Torn medial and lateral menisci, left knee.  11/06/2006    REVIEW OF SYSTEMS:  Constitutional: negative Eyes: negative Ears, nose, mouth, throat, and face: negative Respiratory: negative Cardiovascular: negative Gastrointestinal: negative Genitourinary:negative Integument/breast: negative Hematologic/lymphatic: negative Musculoskeletal:negative Neurological: negative Behavioral/Psych: negative Endocrine: negative Allergic/Immunologic: negative   PHYSICAL EXAMINATION: General appearance: alert, cooperative and no distress Head: Normocephalic, without obvious abnormality, atraumatic Neck: no adenopathy Lymph nodes: Cervical, supraclavicular, and axillary nodes normal. Resp: clear to auscultation bilaterally Back: symmetric, no curvature. ROM normal. No CVA tenderness. Cardio: regular rate and rhythm, S1, S2 normal, no murmur, click, rub or gallop GI: soft, non-tender; bowel sounds normal; no masses,  no organomegaly Extremities: extremities normal, atraumatic, no cyanosis or edema Neurologic: Alert and oriented X 3, normal strength and tone. Normal symmetric reflexes. Normal coordination and gait    ECOG PERFORMANCE STATUS: 0 -  Asymptomatic  Blood pressure 109/68, pulse 64, temperature 97.7 F (36.5 C), temperature source Oral, resp. rate 16, height 6' (1.829 m), weight 239 lb 6.4 oz (108.6 kg), SpO2 100 %.  LABORATORY DATA: Lab Results  Component Value Date   WBC 5.5 12/31/2015   HGB 13.6 12/31/2015   HCT 41.3 12/31/2015   MCV 97.7 12/31/2015   PLT 227 12/31/2015      Chemistry      Component Value Date/Time   NA 141 12/17/2015 1122   K 3.8 12/17/2015 1122   CL 107 07/17/2014 1000   CL 102 04/24/2012 0853   CO2 28 12/17/2015 1122   BUN 14.3 12/17/2015 1122   CREATININE 1.0 12/17/2015 1122      Component Value Date/Time   CALCIUM 9.5 12/17/2015 1122   ALKPHOS 60 12/17/2015 1122   AST 27 12/17/2015 1122   ALT 28 12/17/2015 1122   BILITOT 0.41 12/17/2015 1122       RADIOGRAPHIC STUDIES: Ct Chest W Contrast  Result Date: 12/24/2015 CLINICAL DATA:  Right non-small cell lung cancer EXAM: CT CHEST, ABDOMEN, AND PELVIS WITH CONTRAST TECHNIQUE: Multidetector CT imaging of the chest, abdomen and pelvis was performed following the standard  protocol during bolus administration of intravenous contrast. CONTRAST:  175m ISOVUE-300 IOPAMIDOL (ISOVUE-300) INJECTION 61% COMPARISON:  10/29/2015 RECIST 1.1 Target Lesions: 1. Right lower lobe pulmonary nodule (image 52 of series 5), 1.3 cm long axis (unchanged). 2. Right lower lobe pulmonary nodule (image 58 of series 5), 5 mm in short axis (decreased from 6 mm on prior study). Non-target Lesions: 1. Resolved sub solid nodule anteriorly in the right lower lobe. 2. Right lower lobe pulmonary nodule (image 66 of series 5), and 7 mm in long axis (decreased from 9 mm on prior study). FINDINGS: CT CHEST FINDINGS Cardiovascular: Normal heart size. No pericardial effusion. Aortic atherosclerosis. Mediastinum/Nodes: The trachea appears patent and is midline. Normal appearance of the esophagus. No enlarged mediastinal or hilar lymph nodes. Is C-arm away Lungs/Pleura: No pleural  effusion. Status post right upper lobectomy. Right lower lobe pulmonary nodule (image 49 of series 5) measures 1.7 x 1.3 cm (unchanged when accounting for differences in measurement technique).Right lower lobe pulmonary nodule (image 66 of series 5), decreased in size, currently 7 x 6 mm (previously 9 x 5 mm). Index right lower lobe lung nodule measures 5 x 3 mm, image 58 of series 5. Previously 6 x 4 mm. Musculoskeletal: No aggressive lytic or blastic lesions noted in the visualized portions of the skeleton. CT ABDOMEN PELVIS FINDINGS Hepatobiliary: No focal liver abnormalities. Gallstone is again noted. No biliary dilatation. Pancreas: Unremarkable. No pancreatic ductal dilatation or surrounding inflammatory changes. Spleen: Normal in size without focal abnormality. Adrenals/Urinary Tract: Normal adrenal glands. Right kidney cyst is again identified measuring 1.5 cm, image 65 of series 2. The left kidney appears normal. The urinary bladder appears normal. Stomach/Bowel: The stomach is within normal limits. The small bowel loops have a normal course and caliber. No obstruction. Normal appearance of the colon. Vascular/Lymphatic: No significant vascular findings are present. No enlarged abdominal or pelvic lymph nodes. Reproductive: Prostate is unremarkable. Other: No abdominal wall hernia or abnormality. No abdominopelvic ascites. Musculoskeletal: There are no aggressive appearing lytic or blasticlesions noted in the visualized portions of the skeleton. IMPRESSION: 1. Overall there is been stable to slight improvement in target and non target lesions as above. 2. Aortic atherosclerosis 3. Gallstone. Electronically Signed   By: TKerby MoorsM.D.   On: 12/24/2015 14:10   Ct Abdomen Pelvis W Contrast  Result Date: 12/24/2015 CLINICAL DATA:  RIGHT non-small lung cancer diagnosed 2007. Recurrence in 2016 with RIGHT upper lobectomy. Oral chemotherapy ongoing. EXAM: CT CHEST, ABDOMEN, AND PELVIS WITH CONTRAST  TECHNIQUE: Multidetector CT imaging of the chest, abdomen and pelvis was performed following the standard protocol during bolus administration of intravenous contrast. CONTRAST:  1069mISOVUE-300 IOPAMIDOL (ISOVUE-300) INJECTION 61% COMPARISON:  CT 10/29/2015, 09/03/2015 RECIST 1.1 Target Lesions: 1. RIGHT lower lobe pulmonary nodule measures 1.8 cm (image 48, series 5) compared to 1.8 cm. 2. RIGHT lower lobe pulmonary nodule measures 1.4 cm (image 52, series 5) compared to 1.3 cm Non-target Lesions: 1. Sub solid RIGHT anterior lower lobe nodule - ABSENT 2. RIGHT lower lobe pulmonary nodule measures 0.7 cm (image 66, series 5) FINDINGS: CT CHEST FINDINGS Cardiovascular: No acute findings.  No pericardial fluid. Mediastinum/Nodes: No axillary or supraclavicular adenopathy. No mediastinal hilar adenopathy. Esophagus normal. Lungs/Pleura: Stable bilateral irregular angular pulmonary nodules. Nodule in the superior segment of the RIGHT lower lobe measures 18 x 9 mm not changed from 18 mm by 14 mm. RIGHT lower lobe nodule measuring 7 mm on image 66, series 5 decreased from 9 mm.  No new pulmonary nodularity. Postoperative change in the LEFT upper lobe rectal. Musculoskeletal: No aggressive osseous lesion. CT ABDOMEN AND PELVIS FINDINGS Hepatobiliary: No focal hepatic lesion. No biliary ductal dilatation. Gallbladder is has large gallstone. Common bile duct is normal. Pancreas: Pancreas is normal. No ductal dilatation. No pancreatic inflammation. Spleen: Normal spleen Adrenals/urinary tract: Adrenal glands are normal. Kidneys, ureters and bladder normal. Stomach/Bowel: Stomach, small bowel, appendix, and cecum are normal. The colon and rectosigmoid colon are normal. Vascular/Lymphatic: Abdominal aorta is normal caliber with atherosclerotic calcification. There is no retroperitoneal or periportal lymphadenopathy. No pelvic lymphadenopathy. Reproductive: Prostate normal Other: No peritoneal nodularity. Musculoskeletal: No  aggressive osseous lesion. IMPRESSION: Chest Impression: 1. Stable postoperative change in the RIGHT upper lobe. 2. Stable angular pulmonary nodules. 3. No evidence of lung cancer progression. Abdomen / Pelvis Impression: 1. No evidence of metastatic lung cancer in the abdomen pelvis. 2.  Atherosclerotic calcification of the aorta. Electronically Signed   By: Suzy Bouchard M.D.   On: 12/24/2015 12:28   ASSESSMENT AND PLAN: This is a very pleasant 70 years old white male with recurrent non-small cell lung cancer, adenocarcinoma with positive EGFR mutation in exon 21 that was initially diagnosed as stage IA non-small cell lung cancer status post right upper lobectomy and has been observation since October of 2007. He is status post 6 weeks treatment with Tarceva 150 mg by mouth daily according to the BMS checkmate 370 clinical trial. He is currently on Tarceva 100 mg by mouth daily Status post 18 months and and tolerating it fairly well with no significant adverse effects. The recent CT scan of the chest showed no evidence for disease progression. I discussed the scan results with the patient his wife. I recommended for him to continue his treatment with Tarceva 100 mg by mouth daily. He would come back for follow-up visit in 2 weeks for reevaluation with repeat blood work  The patient was advised to call immediately if he has any other concerning symptoms in the interval.  All questions were answered. The patient knows to call the clinic with any problems, questions or concerns. We can certainly see the patient much sooner if necessary.  Disclaimer: This note was dictated with voice recognition software. Similar sounding words can inadvertently be transcribed and may not be corrected upon review.

## 2015-12-31 NOTE — Progress Notes (Signed)
12/31/2015 0950 The patient returns to the clinic today with his wife. Vital signs and pulse oximetry were obtained at rest. The patient states he has had a good last couple weeks. He has been going to football games and continues to walk daily.  He denies taking any new medications and reports not missing any of his erlotinib doses. He reports having no changes to his overall condition. His rash, fatigue, dyspnea, and right toe fungus are unchanged.   Dr. [name] was in to see the patient.  MD reviewed most recent CT scan results with patient showing stable disease.  MD performed a targeted physical exam. Based on lab results review and exam, the patient was cleared by MD to continue treatment. The patient was reminded to call if there are any changes in his condition.   The patient was informed of the new consent form version 3.1, dated 11/09/15 change due to [facility] address change.  Patient was informed this was the only change to the version 3 consent.  Patient denied any questions, reviewed, initialed and dated each page.  The patient was then informed of the consent form version 4, dated 12/21/15, change.  Dr. [name] explained the rationale for optional additional tumor biopsy at disease progression if it should occur.  The rest of the applicable changes were reviewed with the patient.  Patient denied any questions, reviewed, initialed and dated each page.  Total time spent with patient for consent process was 35 minutes.  A copy of both consent forms were given to the patient. Marcellus Scott, RN, BSN, MHA, OCN

## 2016-01-13 ENCOUNTER — Encounter: Payer: Self-pay | Admitting: *Deleted

## 2016-01-13 ENCOUNTER — Encounter: Payer: Self-pay | Admitting: Internal Medicine

## 2016-01-13 DIAGNOSIS — Z79899 Other long term (current) drug therapy: Secondary | ICD-10-CM

## 2016-01-13 DIAGNOSIS — C3431 Malignant neoplasm of lower lobe, right bronchus or lung: Secondary | ICD-10-CM

## 2016-01-13 MED ORDER — ERLOTINIB HCL 100 MG PO TABS: 100.0000 mg | ORAL_TABLET | Freq: Every day | ORAL | Status: DC

## 2016-01-14 ENCOUNTER — Telehealth: Payer: Self-pay | Admitting: Nurse Practitioner

## 2016-01-14 ENCOUNTER — Encounter: Payer: Medicare Other | Admitting: *Deleted

## 2016-01-14 ENCOUNTER — Other Ambulatory Visit: Payer: Self-pay | Admitting: *Deleted

## 2016-01-14 ENCOUNTER — Other Ambulatory Visit (HOSPITAL_BASED_OUTPATIENT_CLINIC_OR_DEPARTMENT_OTHER): Payer: Medicare Other

## 2016-01-14 ENCOUNTER — Ambulatory Visit (HOSPITAL_BASED_OUTPATIENT_CLINIC_OR_DEPARTMENT_OTHER): Payer: Medicare Other | Admitting: Nurse Practitioner

## 2016-01-14 ENCOUNTER — Other Ambulatory Visit (HOSPITAL_COMMUNITY)
Admission: RE | Admit: 2016-01-14 | Discharge: 2016-01-14 | Disposition: A | Discharge status: Home / Self Care | Payer: Medicare Other | Source: Ambulatory Visit | Attending: Internal Medicine | Admitting: Internal Medicine

## 2016-01-14 VITALS — BP 117/63 | HR 60 | Temp 97.5°F | Resp 16 | Ht 72.0 in | Wt 240.4 lb

## 2016-01-14 DIAGNOSIS — Z79899 Other long term (current) drug therapy: Secondary | ICD-10-CM

## 2016-01-14 DIAGNOSIS — C3431 Malignant neoplasm of lower lobe, right bronchus or lung: Secondary | ICD-10-CM

## 2016-01-14 DIAGNOSIS — Z006 Encounter for examination for normal comparison and control in clinical research program: Secondary | ICD-10-CM

## 2016-01-14 DIAGNOSIS — R21 Rash and other nonspecific skin eruption: Secondary | ICD-10-CM | POA: Diagnosis not present

## 2016-01-14 LAB — PHOSPHORUS: Phosphorus: 2.9 mg/dL (ref 2.5–4.6)

## 2016-01-14 LAB — CBC WITH DIFFERENTIAL/PLATELET
BASO%: 0.4 % (ref 0.0–2.0)
BASOS ABS: 0 10*3/uL (ref 0.0–0.1)
EOS ABS: 0.2 10*3/uL (ref 0.0–0.5)
EOS%: 3.4 % (ref 0.0–7.0)
HCT: 38.1 % — ABNORMAL LOW (ref 38.4–49.9)
HGB: 13 g/dL (ref 13.0–17.1)
LYMPH%: 36.9 % (ref 14.0–49.0)
MCH: 32.5 pg (ref 27.2–33.4)
MCHC: 34.1 g/dL (ref 32.0–36.0)
MCV: 95.3 fL (ref 79.3–98.0)
MONO#: 0.6 10*3/uL (ref 0.1–0.9)
MONO%: 10.1 % (ref 0.0–14.0)
NEUT%: 49.2 % (ref 39.0–75.0)
NEUTROS ABS: 2.8 10*3/uL (ref 1.5–6.5)
PLATELETS: 215 10*3/uL (ref 140–400)
RBC: 4 10*6/uL — AB (ref 4.20–5.82)
RDW: 12.9 % (ref 11.0–14.6)
WBC: 5.6 10*3/uL (ref 4.0–10.3)
lymph#: 2.1 10*3/uL (ref 0.9–3.3)

## 2016-01-14 LAB — COMPREHENSIVE METABOLIC PANEL
ALT: 23 U/L (ref 0–55)
ANION GAP: 8 meq/L (ref 3–11)
AST: 25 U/L (ref 5–34)
Albumin: 3.8 g/dL (ref 3.5–5.0)
Alkaline Phosphatase: 55 U/L (ref 40–150)
BILIRUBIN TOTAL: 0.44 mg/dL (ref 0.20–1.20)
BUN: 14.5 mg/dL (ref 7.0–26.0)
CO2: 27 meq/L (ref 22–29)
Calcium: 9.3 mg/dL (ref 8.4–10.4)
Chloride: 104 mEq/L (ref 98–109)
Creatinine: 1 mg/dL (ref 0.7–1.3)
EGFR: 79 mL/min/{1.73_m2} — AB (ref >90)
Glucose: 99 mg/dl (ref 70–140)
POTASSIUM: 3.8 meq/L (ref 3.5–5.1)
Sodium: 139 mEq/L (ref 136–145)
TOTAL PROTEIN: 6.7 g/dL (ref 6.4–8.3)

## 2016-01-14 LAB — MAGNESIUM: MAGNESIUM: 2.3 mg/dL (ref 1.5–2.5)

## 2016-01-14 LAB — LACTATE DEHYDROGENASE: LDH: 180 U/L (ref 125–245)

## 2016-01-14 LAB — TSH: TSH: 5.677 m[IU]/L — AB (ref 0.320–4.118)

## 2016-01-14 NOTE — Telephone Encounter (Signed)
No 10/12 los. Patient currently on schedule for lab/fu q2w thru December.

## 2016-01-14 NOTE — Progress Notes (Signed)
01/14/2016 0945 BMS 370 cycle 43, week 84 The patient returns to the clinic today with his wife.  Vital signs and pulse oximetry were obtained at rest.  The patient states he has had a good last 2 weeks.  He denies taking any new medications and reports not missing any of his erlotinib doses.  He reports having no changes to his overall condition.  ECOG is a 0.  His rash, fatigue, dyspnea, and right toe fungus are unchanged. Drue Second, NP was in to see patient and to perform a targeted physical exam. Based on lab results review and exam, the patient was cleared by NP to continue treatment. NP aware of asymptomatic elevated TSH level today.  Due to elevated TSH today, reflex T3 and T4 tests have been ordered for further evaluation.   NP to send  Dr. Inda Merlin today's note with TSH results. The patient was reminded to call if there are any changes in his condition. Marcellus Scott, RN, BSN, MHA, OCN

## 2016-01-15 ENCOUNTER — Encounter: Payer: Self-pay | Admitting: Nurse Practitioner

## 2016-01-15 LAB — T3, FREE: Triiodothyronine,Free,Serum: 2.4 pg/mL (ref 2.0–4.4)

## 2016-01-15 LAB — T4, FREE: T4,Free(Direct): 1.25 ng/dL (ref 0.82–1.77)

## 2016-01-15 NOTE — Progress Notes (Signed)
01/15/2016 3545 Spoke with the patient by phone in follow-up of yesterday's visit.  TSH results elevated prompting reflex to T3 and T4 testing--results came back normal.  The patient was notified and stated he would contact his primary care, Dr. Inda Merlin, with results.  The patient denied any questions. Marcellus Scott, RN, BSN, MHA, OCN

## 2016-01-15 NOTE — Assessment & Plan Note (Signed)
Patient has a very mild rash to his lower neck; which is chronic.  He states it is very stable at the present time.  He denies any specific pruritus to the rash.

## 2016-01-15 NOTE — Assessment & Plan Note (Signed)
Patient presented to the [facility] today for follow-up visit.  Patient continues to take Tarceva oral therapy on a daily basis per the BMS Checkmate 370 Trial.  Patient has a very mild, chronic rash around his neck; and has no other new symptoms whatsoever.  He states that he has no new pain; and no other new complaints.  He has had no chills.  He states that he has no new onset fatigue; and has been able to cure him his daily activities with no issues.  Labs obtained today were all essentially within normal limits; with the exception of an elevated TSH at 5.67.  The cancer Center research nurse will order a T3 and T4 for further evaluation today.  ECOG was zero on exam today.  Patient was advised to continue with the Tarceva oral therapy as previously directed.  Patient is scheduled returns for follow-up labs and a visit on 01/28/2016.  He knows to call in the interim with any new worries or concerns whatsoever.

## 2016-01-15 NOTE — Progress Notes (Signed)
HPI:  [name] 70 y.o. male diagnosed with lung cancer.  Currently undergoing Tarceva oral therapy.    Oncology History   Patient followed due to history NSCLC stage IA adenocarcinoma s/p lobectomy 01/2006.  Lung cancer   Staging form: Lung, AJCC 7th Edition     Clinical: Stage IA (T1b, N0, M0) - Signed by Curt Bears, MD on 10/27/2012     Pathologic: No stage assigned - Unsigned Malignant neoplasm of lower lobe of right lung   Staging form: Lung, AJCC 6th Edition     Clinical stage from 05/28/2014: Stage IV (T1, N0, M1) - Signed by Curt Bears, MD on 05/28/2014       Staging comments: Recurrent adenocarcinoma with positive EGFR mutation.        Malignant neoplasm of lower lobe of right lung [facility])    Initial Diagnosis    Malignant neoplasm of lower lobe of right lung      04/22/2014 Imaging    CT Chest with contrast IMPRESSION: 1. Continued slow growth of sub solid right lower lobe nodules status post right upper lobe resection for adenocarcinoma. These findings appearing concerning for recurrent invasive adenocarcinoma. PET-CT recommended       05/07/2014 Imaging    PET scanIMPRESSION: 1. Mildly hypermetabolic bilobed nodule in the right lower lobe, worrisome for low-grade adenocarcinoma. Adjacent nodule has enlarged from 10/23/2012 and is also worrisome for adenocarcinoma. 2. Cholelithiasis. Note addendium      05/14/2014 Procedure    IMPRESSION: CT-guided core biopsies of a right lower lobe nodule      05/14/2014 Pathology Results    Lung, needle/core biopsy(ies), right lower lobe - ADENOCARCINOMA. - SEE COMMENT. Microscopic Comment      06/05/2014 -  Chemotherapy    Oral biologic Tarceva started (on clinical trial)       Review of Systems  Skin: Positive for rash.  All other systems reviewed and are negative.   Past Medical History:  Diagnosis Date  . Cholelithiases 07/16/2015  . Complication of anesthesia 2007   quit breathing with bronchoscopy,  had to spend night  . History of agent Orange exposure 44-45 yrs ago  . History of pneumonia  last 3-4 yrs ago   3 -4 different times  . Hypercholesterolemia   . Hypertension   . Hypothyroidism   . lung ca dx'd 2007   lung right  . Sinus disease    hx of  . Sleep apnea    uses cpap setting opf 12    Past Surgical History:  Procedure Laterality Date  . COLONOSCOPY WITH PROPOFOL N/A 03/19/2013   Procedure: COLONOSCOPY WITH PROPOFOL;  Surgeon: Garlan Fair, MD;  Location: [facility] ENDOSCOPY;  Service: Endoscopy;  Laterality: N/A;  . Cystourethroscopy, [facility].  04/16/2010  . NASAL SINUS SURGERY  1987  . Right video-assisted thoracoscopy, right upper lobectomy and  01/23/2006  . The Olympus video bronchoscope was introduced via the right  12/21/2005  . TONSILLECTOMY  1957  . Torn medial and lateral menisci, left knee.  11/06/2006    has Sinus disease; Sleep apnea; History of pneumonia; History of degenerative joint disease; Cough with hemoptysis; Malignant neoplasm of lower lobe of right lung ([facility]); Encounter for antineoplastic chemotherapy; Rash; Other long term (current) drug therapy; and Cholelithiases on his problem list.    is allergic to lisinopril.    Medication List       Accurate as of 01/14/16 11:59 PM. Always use your most recent med list.  amitriptyline 50 MG tablet Commonly known as:  ELAVIL Take 50 mg by mouth at bedtime. Reported on 06/18/2015   aspirin 81 MG tablet Take 81 mg by mouth every morning.   CENTRUM SILVER PO Take 1 tablet by mouth daily.   Cholecalciferol 2000 units Caps Take 1 capsule by mouth daily.   clindamycin 1 % gel Commonly known as:  CLINDAGEL Apply 1 application topically 2 (two) times daily.   clindamycin 1 % external solution Commonly known as:  CLEOCIN-T Apply topically 2 (two) times daily.   diltiazem 360 MG 24 hr capsule Commonly known as:  CARDIZEM CD Take 360 mg by mouth every morning.   erlotinib 100 MG  tablet Commonly known as:  TARCEVA Take 1 tablet (100 mg total) by mouth daily. Take on an empty stomach 1 hour before meals or 2 hours after.   levETIRAcetam 750 MG tablet Commonly known as:  KEPPRA   levothyroxine 88 MCG tablet Commonly known as:  SYNTHROID, LEVOTHROID Take 88 mcg by mouth daily before breakfast.   metoCLOPramide 10 MG tablet Commonly known as:  REGLAN Take 10 mg by mouth every 6 (six) hours as needed for nausea.   metoprolol succinate 25 MG 24 hr tablet Commonly known as:  TOPROL-XL Take 25 mg by mouth every morning.   naproxen sodium 550 MG tablet Commonly known as:  ANAPROX Take 550 mg by mouth 3 (three) times daily as needed for moderate pain. Reported on 08/27/2015   nitroGLYCERIN 0.4 MG SL tablet Commonly known as:  NITROSTAT Place 0.4 mg under the tongue every 5 (five) minutes as needed for chest pain. Reported on 10/22/2015   polyethylene glycol packet Commonly known as:  MIRALAX / GLYCOLAX Take 17 g by mouth daily. Reported on 08/27/2015   psyllium 58.6 % powder Commonly known as:  METAMUCIL Take 1 packet by mouth 3 (three) times daily. Reported on 08/27/2015   rosuvastatin 5 MG tablet Commonly known as:  CRESTOR Take 5 mg by mouth every morning.   valsartan-hydrochlorothiazide 320-25 MG tablet Commonly known as:  DIOVAN-HCT Take 1 tablet by mouth every morning.        PHYSICAL EXAMINATION  Oncology Vitals 01/14/2016 12/31/2015  Height 183 cm 183 cm  Weight 109.045 kg 108.591 kg  Weight (lbs) 240 lbs 6 oz 239 lbs 6 oz  BMI (kg/m2) 32.6 kg/m2 32.47 kg/m2  Temp 97.5 97.7  Pulse 60 64  Resp 16 16  Resp (Historical as of 11/03/11) - -  SpO2 100 100  BSA (m2) 2.35 m2 2.35 m2   BP Readings from Last 2 Encounters:  01/14/16 117/63  12/31/15 109/68    Physical Exam  Constitutional: He is oriented to person, place, and time and well-developed, well-nourished, and in no distress.  HENT:  Head: Normocephalic and atraumatic.  Mouth/Throat:  Oropharynx is clear and moist.  Eyes: Conjunctivae and EOM are normal. Pupils are equal, round, and reactive to light. Right eye exhibits no discharge. Left eye exhibits no discharge. No scleral icterus.  Neck: Normal range of motion. Neck supple. No JVD present. No tracheal deviation present. No thyromegaly present.  Cardiovascular: Normal rate, regular rhythm, normal heart sounds and intact distal pulses.   Pulmonary/Chest: Effort normal and breath sounds normal. No respiratory distress. He has no wheezes. He has no rales. He exhibits no tenderness.  Abdominal: Soft. Bowel sounds are normal. He exhibits no distension and no mass. There is no tenderness. There is no rebound and no guarding.  Musculoskeletal: Normal range  of motion. He exhibits no edema, tenderness or deformity.  Lymphadenopathy:    He has no cervical adenopathy.  Neurological: He is alert and oriented to person, place, and time. Gait normal.  Skin: Skin is warm and dry. Rash noted. No erythema. No pallor.  Mild rash around the base of patient's neck only.  No infection noted.  Psychiatric: Affect normal.  Nursing note and vitals reviewed.   LABORATORY DATA:. Hospital Outpatient Visit on 01/14/2016  Component Date Value Ref Range Status  . Phosphorus 01/14/2016 2.9  2.5 - 4.6 mg/dL Final  Appointment on 01/14/2016  Component Date Value Ref Range Status  . WBC 01/14/2016 5.6  4.0 - 10.3 10e3/uL Final  . NEUT# 01/14/2016 2.8  1.5 - 6.5 10e3/uL Final  . HGB 01/14/2016 13.0  13.0 - 17.1 g/dL Final  . HCT 01/14/2016 38.1* 38.4 - 49.9 % Final  . Platelets 01/14/2016 215  140 - 400 10e3/uL Final  . MCV 01/14/2016 95.3  79.3 - 98.0 fL Final  . MCH 01/14/2016 32.5  27.2 - 33.4 pg Final  . MCHC 01/14/2016 34.1  32.0 - 36.0 g/dL Final  . RBC 01/14/2016 4.00* 4.20 - 5.82 10e6/uL Final  . RDW 01/14/2016 12.9  11.0 - 14.6 % Final  . lymph# 01/14/2016 2.1  0.9 - 3.3 10e3/uL Final  . MONO# 01/14/2016 0.6  0.1 - 0.9 10e3/uL Final  .  Eosinophils Absolute 01/14/2016 0.2  0.0 - 0.5 10e3/uL Final  . Basophils Absolute 01/14/2016 0.0  0.0 - 0.1 10e3/uL Final  . NEUT% 01/14/2016 49.2  39.0 - 75.0 % Final  . LYMPH% 01/14/2016 36.9  14.0 - 49.0 % Final  . MONO% 01/14/2016 10.1  0.0 - 14.0 % Final  . EOS% 01/14/2016 3.4  0.0 - 7.0 % Final  . BASO% 01/14/2016 0.4  0.0 - 2.0 % Final  . Sodium 01/14/2016 139  136 - 145 mEq/L Final  . Potassium 01/14/2016 3.8  3.5 - 5.1 mEq/L Final  . Chloride 01/14/2016 104  98 - 109 mEq/L Final  . CO2 01/14/2016 27  22 - 29 mEq/L Final  . Glucose 01/14/2016 99  70 - 140 mg/dl Final  . BUN 01/14/2016 14.5  7.0 - 26.0 mg/dL Final  . Creatinine 01/14/2016 1.0  0.7 - 1.3 mg/dL Final  . Total Bilirubin 01/14/2016 0.44  0.20 - 1.20 mg/dL Final  . Alkaline Phosphatase 01/14/2016 55  40 - 150 U/L Final  . AST 01/14/2016 25  5 - 34 U/L Final  . ALT 01/14/2016 23  0 - 55 U/L Final  . Total Protein 01/14/2016 6.7  6.4 - 8.3 g/dL Final  . Albumin 01/14/2016 3.8  3.5 - 5.0 g/dL Final  . Calcium 01/14/2016 9.3  8.4 - 10.4 mg/dL Final  . Anion Gap 01/14/2016 8  3 - 11 mEq/L Final  . EGFR 01/14/2016 79* >90 ml/min/1.73 m2 Final  . LDH 01/14/2016 180  125 - 245 U/L Final  . Magnesium 01/14/2016 2.3  1.5 - 2.5 mg/dl Final  . TSH 01/14/2016 5.677* 0.320 - 4.118 m(IU)/L Final  . T4,Free(Direct) 01/15/2016 1.25  0.82 - 1.77 ng/dL Final  . Triiodothyronine,Free,Serum 01/15/2016 2.4  2.0 - 4.4 pg/mL Final    RADIOGRAPHIC STUDIES: No results found.  ASSESSMENT/PLAN:    Rash Patient has a very mild rash to his lower neck; which is chronic.  He states it is very stable at the present time.  He denies any specific pruritus to the rash.  Malignant neoplasm  of lower lobe of right lung Patient presented to the [facility] today for follow-up visit.  Patient continues to take Tarceva oral therapy on a daily basis per the BMS Checkmate 370 Trial.  Patient has a very mild, chronic rash around his neck; and has no  other new symptoms whatsoever.  He states that he has no new pain; and no other new complaints.  He has had no chills.  He states that he has no new onset fatigue; and has been able to cure him his daily activities with no issues.  Labs obtained today were all essentially within normal limits; with the exception of an elevated TSH at 5.67.  The cancer Center research nurse will order a T3 and T4 for further evaluation today.  ECOG was zero on exam today.  Patient was advised to continue with the Tarceva oral therapy as previously directed.  Patient is scheduled returns for follow-up labs and a visit on 01/28/2016.  He knows to call in the interim with any new worries or concerns whatsoever.   Patient stated understanding of all instructions; and was in agreement with this plan of care. The patient knows to call the clinic with any problems, questions or concerns.   Total time spent with patient was 15 minutes;  with greater than 75 percent of that time spent in face to face counseling regarding patient's symptoms,  and coordination of care and follow up.  Disclaimer:This dictation was prepared with Dragon/digital dictation along with [employer]. Any transcriptional errors that result from this process are unintentional.  Drue Second, NP 01/15/2016

## 2016-01-27 ENCOUNTER — Encounter: Payer: Self-pay | Admitting: Internal Medicine

## 2016-01-27 ENCOUNTER — Encounter: Payer: Self-pay | Admitting: *Deleted

## 2016-01-27 DIAGNOSIS — C3431 Malignant neoplasm of lower lobe, right bronchus or lung: Secondary | ICD-10-CM

## 2016-01-27 DIAGNOSIS — Z79899 Other long term (current) drug therapy: Secondary | ICD-10-CM

## 2016-01-27 MED ORDER — ERLOTINIB HCL 100 MG PO TABS: 100.0000 mg | ORAL_TABLET | Freq: Every day | ORAL | Status: DC

## 2016-01-27 MED FILL — TARCEVA 100 MG TABLET: 100 | 30 days supply | Qty: 30 | Fill #1

## 2016-01-28 ENCOUNTER — Encounter: Payer: Self-pay | Admitting: Internal Medicine

## 2016-01-28 ENCOUNTER — Ambulatory Visit (HOSPITAL_BASED_OUTPATIENT_CLINIC_OR_DEPARTMENT_OTHER): Payer: Medicare Other | Admitting: Internal Medicine

## 2016-01-28 ENCOUNTER — Other Ambulatory Visit (HOSPITAL_COMMUNITY)
Admission: RE | Admit: 2016-01-28 | Discharge: 2016-01-28 | Disposition: A | Discharge status: Home / Self Care | Payer: Medicare Other | Source: Ambulatory Visit | Attending: Internal Medicine | Admitting: Internal Medicine

## 2016-01-28 ENCOUNTER — Other Ambulatory Visit (HOSPITAL_BASED_OUTPATIENT_CLINIC_OR_DEPARTMENT_OTHER): Payer: Medicare Other

## 2016-01-28 VITALS — BP 107/67 | HR 60 | Temp 98.4°F | Resp 18 | Ht 72.0 in | Wt 241.1 lb

## 2016-01-28 DIAGNOSIS — C3431 Malignant neoplasm of lower lobe, right bronchus or lung: Secondary | ICD-10-CM | POA: Diagnosis not present

## 2016-01-28 DIAGNOSIS — Z006 Encounter for examination for normal comparison and control in clinical research program: Secondary | ICD-10-CM

## 2016-01-28 DIAGNOSIS — Z5111 Encounter for antineoplastic chemotherapy: Secondary | ICD-10-CM

## 2016-01-28 LAB — COMPREHENSIVE METABOLIC PANEL
ALT: 32 U/L (ref 0–55)
ANION GAP: 7 meq/L (ref 3–11)
AST: 30 U/L (ref 5–34)
Albumin: 3.7 g/dL (ref 3.5–5.0)
Alkaline Phosphatase: 70 U/L (ref 40–150)
BUN: 15.1 mg/dL (ref 7.0–26.0)
CHLORIDE: 105 meq/L (ref 98–109)
CO2: 30 meq/L — AB (ref 22–29)
CREATININE: 1 mg/dL (ref 0.7–1.3)
Calcium: 9.6 mg/dL (ref 8.4–10.4)
EGFR: 77 mL/min/{1.73_m2} — ABNORMAL LOW (ref >90)
GLUCOSE: 100 mg/dL (ref 70–140)
Potassium: 4.1 mEq/L (ref 3.5–5.1)
SODIUM: 142 meq/L (ref 136–145)
Total Bilirubin: 0.34 mg/dL (ref 0.20–1.20)
Total Protein: 6.9 g/dL (ref 6.4–8.3)

## 2016-01-28 LAB — CBC WITH DIFFERENTIAL/PLATELET
BASO%: 0.4 % (ref 0.0–2.0)
Basophils Absolute: 0 10*3/uL (ref 0.0–0.1)
EOS%: 2.8 % (ref 0.0–7.0)
Eosinophils Absolute: 0.1 10*3/uL (ref 0.0–0.5)
HCT: 37.6 % — ABNORMAL LOW (ref 38.4–49.9)
HGB: 12.8 g/dL — ABNORMAL LOW (ref 13.0–17.1)
LYMPH%: 38.3 % (ref 14.0–49.0)
MCH: 32.8 pg (ref 27.2–33.4)
MCHC: 34 g/dL (ref 32.0–36.0)
MCV: 96.4 fL (ref 79.3–98.0)
MONO#: 0.6 10*3/uL (ref 0.1–0.9)
MONO%: 12.8 % (ref 0.0–14.0)
NEUT%: 45.7 % (ref 39.0–75.0)
NEUTROS ABS: 2.3 10*3/uL (ref 1.5–6.5)
PLATELETS: 195 10*3/uL (ref 140–400)
RBC: 3.9 10*6/uL — AB (ref 4.20–5.82)
RDW: 13.3 % (ref 11.0–14.6)
WBC: 5 10*3/uL (ref 4.0–10.3)
lymph#: 1.9 10*3/uL (ref 0.9–3.3)

## 2016-01-28 LAB — MAGNESIUM: MAGNESIUM: 2.3 mg/dL (ref 1.5–2.5)

## 2016-01-28 LAB — LACTATE DEHYDROGENASE: LDH: 188 U/L (ref 125–245)

## 2016-01-28 LAB — PHOSPHORUS: PHOSPHORUS: 3.5 mg/dL (ref 2.5–4.6)

## 2016-01-28 NOTE — Progress Notes (Signed)
01/28/2016  0950  BMS 370 cycle 44, week 86  The patient returns to the clinic today with his wife. Vital signs and pulse oximetry were obtained at rest. The patient states he has had a good last couple weeks. He has been going to football games and continues to walk daily.  He denies taking any new medications and reports not missing any of his erlotinib doses. He reports having no changes to his overall condition. His rash, fatigue, dyspnea, and right toe fungus are unchanged.  He reports that he contacted Dr. Inda Merlin about his elevated TSH level from last visit and informed him the T3 and T4 were normal.  Patient stated Dr. Inda Merlin was not concerned and will continue to monitor. Dr. [name] was in to see patient and to perform a targeted physical exam. Based on lab results review and exam, the patient was cleared by MD to continue treatment. The patient was reminded to call if there are any changes in his condition.  The patient is aware of CT scan due on 02/18/16. Marcellus Scott, RN, BSN, MHA, OCN

## 2016-01-28 NOTE — Progress Notes (Signed)
[place] [phone]   [phone]  OFFICE PROGRESS NOTE  [name],[name], MD 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 Roxan Hockey on 01/23/2006.  2) Tarceva 150 mg by mouth daily as part of the BMS checkmate 370 clinical trial. Status post 6 weeks of treatment.   CURRENT THERAPY: Tarceva 100 mg by mouth daily as part of the BMS checkmate 370 clinical trial. Started 07/17/2014 Status post 18 months of treatment.  DISEASE STAGE: Recurrent  CHEMOTHERAPY INTENT: Palliative.   CURRENT # OF CHEMOTHERAPY CYCLES: 19  CURRENT ANTIEMETICS: None  CURRENT SMOKING STATUS: Non-smoker  ORAL CHEMOTHERAPY AND CONSENT: None  CURRENT BISPHOSPHONATES USE: None  LIVING WILL AND CODE STATUS: Full code.  INTERVAL HISTORY: [name] 70 y.o. male returns to the clinic today for followup visit accompanied by his wife. The patient is doing fine with no complaints today. He has no significant change since his last visit. He continues to tolerate his treatment with Tarceva well. He denied having any skin rash or diarrhea. He denied having any significant chest pain, shortness of breath, cough or hemoptysis. The patient denied having any fever or chills, no vomiting, abdominal pain, or constipation. He is here today for evaluation and repeat blood work.  MEDICAL HISTORY: Past Medical History:  Diagnosis Date  . Cholelithiases 07/16/2015  . Complication of anesthesia 2007   quit breathing with bronchoscopy, had to spend night  . History of agent Orange exposure 44-45 yrs ago  . History of pneumonia  last 3-4 yrs ago   3 -4  different times  . Hypercholesterolemia   . Hypertension   . Hypothyroidism   . lung ca dx'd 2007   lung right  . Sinus disease    hx of  . Sleep apnea    uses cpap setting opf 12    ALLERGIES:  is allergic to lisinopril.  MEDICATIONS:  Current Outpatient Prescriptions  Medication Sig Dispense Refill  . amitriptyline (ELAVIL) 50 MG tablet Take 50 mg by mouth at bedtime. Reported on 06/18/2015    . aspirin 81 MG tablet Take 81 mg by mouth every morning.     . Cholecalciferol 2000 units CAPS Take 1 capsule by mouth daily.    . clindamycin (CLEOCIN-T) 1 % external solution Apply topically 2 (two) times daily. 240 mL 2  . clindamycin (CLINDAGEL) 1 % gel Apply 1 application topically 2 (two) times daily.    [name] diltiazem (CARDIZEM CD) 360 MG 24 hr capsule Take 360 mg by mouth every morning.     . erlotinib (TARCEVA) 100 MG tablet Take 1 tablet (100 mg total) by mouth daily. Take on an empty stomach 1 hour before meals or 2 hours after.    . levETIRAcetam (KEPPRA) 750 MG tablet     . levothyroxine (SYNTHROID, LEVOTHROID) 88 MCG tablet Take 88 mcg by mouth daily before breakfast.    . metoCLOPramide (REGLAN) 10 MG tablet Take 10 mg by mouth every 6 (six) hours as needed for nausea.     . metoprolol succinate (TOPROL-XL) 25 MG 24 hr tablet Take 25 mg by mouth every morning.     . Multiple Vitamins-Minerals (  CENTRUM SILVER PO) Take 1 tablet by mouth daily.     . naproxen sodium (ANAPROX) 550 MG tablet Take 550 mg by mouth 3 (three) times daily as needed for moderate pain. Reported on 08/27/2015    . nitroGLYCERIN (NITROSTAT) 0.4 MG SL tablet Place 0.4 mg under the tongue every 5 (five) minutes as needed for chest pain. Reported on 10/22/2015    . polyethylene glycol (MIRALAX / GLYCOLAX) packet Take 17 g by mouth daily. Reported on 08/27/2015    . psyllium (METAMUCIL) 58.6 % powder Take 1 packet by mouth 3 (three) times daily. Reported on 08/27/2015    . rosuvastatin (CRESTOR) 5 MG tablet Take 5 mg  by mouth every morning.     . valsartan-hydrochlorothiazide (DIOVAN-HCT) 320-25 MG per tablet Take 1 tablet by mouth every morning.      No current facility-administered medications for this visit.     SURGICAL HISTORY:  Past Surgical History:  Procedure Laterality Date  . COLONOSCOPY WITH PROPOFOL N/A 03/19/2013   Procedure: COLONOSCOPY WITH PROPOFOL;  Surgeon: Garlan Fair, MD;  Location: [facility];  Service: Endoscopy;  Laterality: N/A;  . Cystourethroscopy, [facility].  04/16/2010  . NASAL SINUS SURGERY  1987  . Right video-assisted thoracoscopy, right upper lobectomy and  01/23/2006  . The Olympus video bronchoscope was introduced via the right  12/21/2005  . TONSILLECTOMY  1957  . Torn medial and lateral menisci, left knee.  11/06/2006    REVIEW OF SYSTEMS:  A comprehensive review of systems was negative.   PHYSICAL EXAMINATION: General appearance: alert, cooperative and no distress Head: Normocephalic, without obvious abnormality, atraumatic Neck: no adenopathy Lymph nodes: Cervical, supraclavicular, and axillary nodes normal. Resp: clear to auscultation bilaterally Back: symmetric, no curvature. ROM normal. No CVA tenderness. Cardio: regular rate and rhythm, S1, S2 normal, no murmur, click, rub or gallop GI: soft, non-tender; bowel sounds normal; no masses,  no organomegaly Extremities: extremities normal, atraumatic, no cyanosis or edema Neurologic: Alert and oriented X 3, normal strength and tone. Normal symmetric reflexes. Normal coordination and gait    ECOG PERFORMANCE STATUS: 0 - Asymptomatic  Blood pressure 107/67, pulse 60, temperature 98.4 F (36.9 C), temperature source Oral, resp. rate 18, height 6' (1.829 m), weight 241 lb 1.6 oz (109.4 kg), SpO2 100 %.  LABORATORY DATA: Lab Results  Component Value Date   WBC 5.0 01/28/2016   HGB 12.8 (L) 01/28/2016   HCT 37.6 (L) 01/28/2016   MCV 96.4 01/28/2016   PLT 195 01/28/2016      Chemistry        Component Value Date/Time   NA 142 01/28/2016 0854   K 4.1 01/28/2016 0854   CL 107 07/17/2014 1000   CL 102 04/24/2012 0853   CO2 30 (H) 01/28/2016 0854   BUN 15.1 01/28/2016 0854   CREATININE 1.0 01/28/2016 0854      Component Value Date/Time   CALCIUM 9.6 01/28/2016 0854   ALKPHOS 70 01/28/2016 0854   AST 30 01/28/2016 0854   ALT 32 01/28/2016 0854   BILITOT 0.34 01/28/2016 0854       RADIOGRAPHIC STUDIES: No results found. ASSESSMENT AND PLAN: This is a very pleasant 70 years old white male with recurrent non-small cell lung cancer, adenocarcinoma with positive EGFR mutation in exon 21 that was initially diagnosed as stage IA non-small cell lung cancer status post right upper lobectomy and has been observation since October of 2007. He is status post 6 weeks treatment with Tarceva 150  mg by mouth daily according to the BMS checkmate 370 clinical trial. He is currently on Tarceva 100 mg by mouth daily Status post 19 months and and tolerating it fairly well with no significant adverse effects. I recommended for him to continue his treatment with Tarceva 100 mg by mouth daily. He would come back for follow-up visit in 2 weeks for reevaluation with repeat blood work. He will have restaging CT scan of the chest, abdomen and pelvis on 02/18/2016. The patient was advised to call immediately if he has any other concerning symptoms in the interval.  All questions were answered. The patient knows to call the clinic with any problems, questions or concerns. We can certainly see the patient much sooner if necessary.  Disclaimer: This note was dictated with voice recognition software. Similar sounding words can inadvertently be transcribed and may not be corrected upon review.

## 2016-02-08 ENCOUNTER — Telehealth: Payer: Self-pay | Admitting: *Deleted

## 2016-02-08 ENCOUNTER — Telehealth: Payer: Self-pay | Admitting: Medical Oncology

## 2016-02-08 NOTE — Telephone Encounter (Signed)
02/08/2016 1415 Received phone message from patient stating he had a root canal today and that he had been prescribed Amoxicillin and Lodine.  He wanted to make sure Dr. [name] was aware and that it was okay to take these new medications.  This RN spoke with Dr. [name] regarding above.  Per [name], it is okay to take the Amoxicillin and Lodine.  This RN also checked with Pharmacist, [name] who reviewed patient's current med list and did not see any problems with drug interactions.  She recommended patient not take Naproxen if taking Lodine. This RN spoke with the patient regarding above.  The patient stated he had not taken Naproxen in a long time and only used it as needed for headaches.  He was instructed not to take Naproxen while taking Lodine.  He was reminded to call MD for any problems and was reminded of side effects to watch for including abdominal pain or bleeding.  He was reminded to follow directions for how to take the new medications.  The patient thanked this RN and was without any questions. Andrew Scott, RN, BSN, MHA, OCN

## 2016-02-08 NOTE — Telephone Encounter (Signed)
Root canal done today . DDS prescribed Amoxicillin and etodolac ( NSAID) . Can he take these with tarceva.? I told him amoxicillins is ok and will check with [facility] for NSAID

## 2016-02-10 ENCOUNTER — Encounter: Payer: Self-pay | Admitting: *Deleted

## 2016-02-10 ENCOUNTER — Encounter: Payer: Self-pay | Admitting: Internal Medicine

## 2016-02-10 DIAGNOSIS — Z79899 Other long term (current) drug therapy: Secondary | ICD-10-CM

## 2016-02-10 DIAGNOSIS — C3431 Malignant neoplasm of lower lobe, right bronchus or lung: Secondary | ICD-10-CM

## 2016-02-10 MED ORDER — ERLOTINIB HCL 100 MG PO TABS: 100.0000 mg | ORAL_TABLET | Freq: Every day | ORAL | Status: DC

## 2016-02-11 ENCOUNTER — Encounter: Payer: Self-pay | Admitting: Internal Medicine

## 2016-02-11 ENCOUNTER — Other Ambulatory Visit (HOSPITAL_BASED_OUTPATIENT_CLINIC_OR_DEPARTMENT_OTHER): Payer: Medicare Other

## 2016-02-11 ENCOUNTER — Telehealth: Payer: Self-pay | Admitting: Internal Medicine

## 2016-02-11 ENCOUNTER — Ambulatory Visit (HOSPITAL_BASED_OUTPATIENT_CLINIC_OR_DEPARTMENT_OTHER): Payer: Medicare Other | Admitting: Internal Medicine

## 2016-02-11 ENCOUNTER — Other Ambulatory Visit: Payer: Self-pay | Admitting: Emergency Medicine

## 2016-02-11 ENCOUNTER — Other Ambulatory Visit (HOSPITAL_COMMUNITY)
Admission: RE | Admit: 2016-02-11 | Discharge: 2016-02-11 | Disposition: A | Discharge status: Home / Self Care | Payer: Medicare Other | Source: Ambulatory Visit | Attending: Internal Medicine | Admitting: Internal Medicine

## 2016-02-11 VITALS — BP 123/74 | HR 75 | Temp 98.7°F | Resp 18 | Wt 238.7 lb

## 2016-02-11 DIAGNOSIS — C3431 Malignant neoplasm of lower lobe, right bronchus or lung: Secondary | ICD-10-CM

## 2016-02-11 DIAGNOSIS — Z006 Encounter for examination for normal comparison and control in clinical research program: Secondary | ICD-10-CM

## 2016-02-11 DIAGNOSIS — Z5111 Encounter for antineoplastic chemotherapy: Secondary | ICD-10-CM

## 2016-02-11 DIAGNOSIS — Z79899 Other long term (current) drug therapy: Secondary | ICD-10-CM

## 2016-02-11 LAB — CBC WITH DIFFERENTIAL/PLATELET
BASO%: 0.6 % (ref 0.0–2.0)
Basophils Absolute: 0 10*3/uL (ref 0.0–0.1)
EOS ABS: 0.2 10*3/uL (ref 0.0–0.5)
EOS%: 3.7 % (ref 0.0–7.0)
HCT: 38.8 % (ref 38.4–49.9)
HGB: 13.3 g/dL (ref 13.0–17.1)
LYMPH%: 37.3 % (ref 14.0–49.0)
MCH: 32.7 pg (ref 27.2–33.4)
MCHC: 34.3 g/dL (ref 32.0–36.0)
MCV: 95.3 fL (ref 79.3–98.0)
MONO#: 0.7 10*3/uL (ref 0.1–0.9)
MONO%: 13.5 % (ref 0.0–14.0)
NEUT%: 44.9 % (ref 39.0–75.0)
NEUTROS ABS: 2.4 10*3/uL (ref 1.5–6.5)
Platelets: 242 10*3/uL (ref 140–400)
RBC: 4.07 10*6/uL — AB (ref 4.20–5.82)
RDW: 13.2 % (ref 11.0–14.6)
WBC: 5.4 10*3/uL (ref 4.0–10.3)
lymph#: 2 10*3/uL (ref 0.9–3.3)

## 2016-02-11 LAB — COMPREHENSIVE METABOLIC PANEL
ALT: 24 U/L (ref 0–55)
ANION GAP: 9 meq/L (ref 3–11)
AST: 26 U/L (ref 5–34)
Albumin: 3.6 g/dL (ref 3.5–5.0)
Alkaline Phosphatase: 71 U/L (ref 40–150)
BUN: 16.2 mg/dL (ref 7.0–26.0)
CALCIUM: 9.6 mg/dL (ref 8.4–10.4)
CHLORIDE: 104 meq/L (ref 98–109)
CO2: 25 meq/L (ref 22–29)
Creatinine: 1 mg/dL (ref 0.7–1.3)
EGFR: 77 mL/min/{1.73_m2} — ABNORMAL LOW (ref >90)
Glucose: 101 mg/dl (ref 70–140)
POTASSIUM: 3.6 meq/L (ref 3.5–5.1)
Sodium: 138 mEq/L (ref 136–145)
Total Bilirubin: 0.47 mg/dL (ref 0.20–1.20)
Total Protein: 7 g/dL (ref 6.4–8.3)

## 2016-02-11 LAB — PHOSPHORUS: Phosphorus: 2.5 mg/dL (ref 2.5–4.6)

## 2016-02-11 LAB — LACTATE DEHYDROGENASE: LDH: 189 U/L (ref 125–245)

## 2016-02-11 LAB — MAGNESIUM: MAGNESIUM: 2.3 mg/dL (ref 1.5–2.5)

## 2016-02-11 NOTE — Telephone Encounter (Signed)
04/08/15 and 04/22/15 appointments [facility] for an earlier time, per patient request. 2 bottles of contrast and instructions was given to patient, per CT appointment. Appointments scheduled per 02/11/16 los. AVS report and appointment schedule given to patient per 02/11/16 los.

## 2016-02-11 NOTE — Progress Notes (Signed)
[place] [phone]   [phone]) [phone]  OFFICE PROGRESS NOTE  [name],[name], MD 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 Roxan Hockey on 01/23/2006.  2) Tarceva 150 mg by mouth daily as part of the BMS checkmate 370 clinical trial. Status post 6 weeks of treatment.   CURRENT THERAPY: Tarceva 100 mg by mouth daily as part of the BMS checkmate 370 clinical trial. Started 07/17/2014 Status post 18 months of treatment.  DISEASE STAGE: Recurrent  CHEMOTHERAPY INTENT: Palliative.   CURRENT # OF CHEMOTHERAPY CYCLES: 19  CURRENT ANTIEMETICS: None  CURRENT SMOKING STATUS: Non-smoker  ORAL CHEMOTHERAPY AND CONSENT: None  CURRENT BISPHOSPHONATES USE: None  LIVING WILL AND CODE STATUS: Full code.  INTERVAL HISTORY: [name] 70 y.o. male returns to the clinic today for followup visit accompanied by his wife. The patient is doing fine with no complaints today. He was seen by his dentist recently and had a root canal procedure. Has some mild infection after the procedure and he is currently on amoxicillin. He continues to tolerate his treatment with Tarceva well. He denied having any skin rash or diarrhea. He denied having any significant chest pain, shortness of breath, cough or hemoptysis. The patient denied having any fever or chills, no vomiting, abdominal pain, or constipation. He is here today for evaluation and repeat blood work.  MEDICAL HISTORY: Past Medical History:  Diagnosis Date  . Cholelithiases 07/16/2015  . Complication of anesthesia 2007   quit breathing with bronchoscopy, had to spend night  .  History of agent Orange exposure 44-45 yrs ago  . History of pneumonia  last 3-4 yrs ago   3 -4 different times  . Hypercholesterolemia   . Hypertension   . Hypothyroidism   . lung ca dx'd 2007   lung right  . Sinus disease    hx of  . Sleep apnea    uses cpap setting opf 12    ALLERGIES:  is allergic to lisinopril.  MEDICATIONS:  Current Outpatient Prescriptions  Medication Sig Dispense Refill  . amitriptyline (ELAVIL) 50 MG tablet Take 50 mg by mouth at bedtime. Reported on 06/18/2015    . amoxicillin (AMOXIL) 500 MG tablet Take 500 mg by mouth 4 (four) times daily. For dental infection    . aspirin 81 MG tablet Take 81 mg by mouth every morning.     . Cholecalciferol 2000 units CAPS Take 1 capsule by mouth daily.    . clindamycin (CLEOCIN-T) 1 % external solution Apply topically 2 (two) times daily. 240 mL 2  . clindamycin (CLINDAGEL) 1 % gel Apply 1 application topically 2 (two) times daily.    [name] diltiazem (CARDIZEM CD) 360 MG 24 hr capsule Take 360 mg by mouth every morning.     . erlotinib (TARCEVA) 100 MG tablet Take 1 tablet (100 mg total) by mouth daily. Take on an empty stomach 1 hour before meals or 2 hours after.    . etodolac (LODINE) 400 MG tablet Take 400 mg by mouth 3 (three) times daily. For 4 days    . levETIRAcetam (KEPPRA) 750 MG tablet     . levothyroxine (SYNTHROID, LEVOTHROID) 88  MCG tablet Take 88 mcg by mouth daily before breakfast.    . metoCLOPramide (REGLAN) 10 MG tablet Take 10 mg by mouth every 6 (six) hours as needed for nausea.     . metoprolol succinate (TOPROL-XL) 25 MG 24 hr tablet Take 25 mg by mouth every morning.     . Multiple Vitamins-Minerals (CENTRUM SILVER PO) Take 1 tablet by mouth daily.     . nitroGLYCERIN (NITROSTAT) 0.4 MG SL tablet Place 0.4 mg under the tongue every 5 (five) minutes as needed for chest pain. Reported on 10/22/2015    . polyethylene glycol (MIRALAX / GLYCOLAX) packet Take 17 g by mouth daily. Reported on 08/27/2015    .  psyllium (METAMUCIL) 58.6 % powder Take 1 packet by mouth 3 (three) times daily. Reported on 08/27/2015    . rosuvastatin (CRESTOR) 5 MG tablet Take 5 mg by mouth every morning.     . valsartan-hydrochlorothiazide (DIOVAN-HCT) 320-25 MG per tablet Take 1 tablet by mouth every morning.     . naproxen sodium (ANAPROX) 550 MG tablet Take 550 mg by mouth 3 (three) times daily as needed for moderate pain. Reported on 08/27/2015     No current facility-administered medications for this visit.     SURGICAL HISTORY:  Past Surgical History:  Procedure Laterality Date  . COLONOSCOPY WITH PROPOFOL N/A 03/19/2013   Procedure: COLONOSCOPY WITH PROPOFOL;  Surgeon: Garlan Fair, MD;  Location: [facility];  Service: Endoscopy;  Laterality: N/A;  . Cystourethroscopy, [facility].  04/16/2010  . NASAL SINUS SURGERY  1987  . Right video-assisted thoracoscopy, right upper lobectomy and  01/23/2006  . The Olympus video bronchoscope was introduced via the right  12/21/2005  . TONSILLECTOMY  1957  . Torn medial and lateral menisci, left knee.  11/06/2006    REVIEW OF SYSTEMS:  A comprehensive review of systems was negative.   PHYSICAL EXAMINATION: General appearance: alert, cooperative and no distress Head: Normocephalic, without obvious abnormality, atraumatic Neck: no adenopathy Lymph nodes: Cervical, supraclavicular, and axillary nodes normal. Resp: clear to auscultation bilaterally Back: symmetric, no curvature. ROM normal. No CVA tenderness. Cardio: regular rate and rhythm, S1, S2 normal, no murmur, click, rub or gallop GI: soft, non-tender; bowel sounds normal; no masses,  no organomegaly Extremities: extremities normal, atraumatic, no cyanosis or edema Neurologic: Alert and oriented X 3, normal strength and tone. Normal symmetric reflexes. Normal coordination and gait    ECOG PERFORMANCE STATUS: 0 - Asymptomatic  Blood pressure 123/74, pulse 75, temperature 98.7 F (37.1 C), temperature  source Oral, resp. rate 18, weight 238 lb 11.2 oz (108.3 kg), SpO2 100 %.  LABORATORY DATA: Lab Results  Component Value Date   WBC 5.4 02/11/2016   HGB 13.3 02/11/2016   HCT 38.8 02/11/2016   MCV 95.3 02/11/2016   PLT 242 02/11/2016      Chemistry      Component Value Date/Time   NA 142 01/28/2016 0854   K 4.1 01/28/2016 0854   CL 107 07/17/2014 1000   CL 102 04/24/2012 0853   CO2 30 (H) 01/28/2016 0854   BUN 15.1 01/28/2016 0854   CREATININE 1.0 01/28/2016 0854      Component Value Date/Time   CALCIUM 9.6 01/28/2016 0854   ALKPHOS 70 01/28/2016 0854   AST 30 01/28/2016 0854   ALT 32 01/28/2016 0854   BILITOT 0.34 01/28/2016 0854       RADIOGRAPHIC STUDIES: No results found. ASSESSMENT AND PLAN: This is a very pleasant 70  years old white male with recurrent non-small cell lung cancer, adenocarcinoma with positive EGFR mutation in exon 21 that was initially diagnosed as stage IA non-small cell lung cancer status post right upper lobectomy and has been observation since October of 2007. He is status post 6 weeks treatment with Tarceva 150 mg by mouth daily according to the BMS checkmate 370 clinical trial. He is currently on Tarceva 100 mg by mouth daily Status post 19 months and and tolerating it fairly well with no significant adverse effects. I recommended for him to continue his treatment with Tarceva 100 mg by mouth daily. He would come back for follow-up visit in 2 weeks for reevaluation with repeat blood work and repeat CT scan of the chest, abdomen and pelvis next week.  The patient was advised to call immediately if he has any other concerning symptoms in the interval.  All questions were answered. The patient knows to call the clinic with any problems, questions or concerns. We can certainly see the patient much sooner if necessary.  Disclaimer: This note was dictated with voice recognition software. Similar sounding words can inadvertently be transcribed and may  not be corrected upon review.

## 2016-02-11 NOTE — Progress Notes (Signed)
02/11/2016 0915 BMS 370 week 88 cycle 45 The patient returns to the clinic accompanied by his wife.  He completed the PROs due this visit.  He had labs drawn.  His vital signs and O2 sat were obtained at rest.  Weight remains stable. The patient reports having had to go to the dentist on 02/08/16 for a root canal.  The patient had to return to the dentist on 02/10/16 for drainage of the area.  He denies any fever, bleeding, or pain at this time.  He reports that his mouth is sore today, grade 1.  The patient stated he started taking Amoxicillin on 02/08/16 and that he started Lodine on 02/08/16.  He stated he is not taking Naproxen and has not had to take it (for headaches) in months. The patient reports no changes in his ongoing fatigue, skin rash, dyspnea with exertion, or his toenail fungus.  He said the only changes since last visit are his tooth issues. Dr. [name] was in to see the patient.  MD performed a physical exam and interview of the patient.  Based on lab results review and today's exam, the patient was cleared by Dr. [name] to continue study treatment.  Dr, [name] stated the grade 2 tooth infection was not related to erlotinib and that it was okay to continue per protocol.   The patient is scheduled for a CT scan on 02/18/16.  He understands to report for any changes in his condition.  He understands to remind other providers he may come in contact with that he is taking erlotinib and is on a research study.  The patient verbalized understanding. Marcellus Scott, RN, BSN, MHA, OCN

## 2016-02-15 DIAGNOSIS — G43019 Migraine without aura, intractable, without status migrainosus: Secondary | ICD-10-CM | POA: Diagnosis not present

## 2016-02-15 DIAGNOSIS — G43111 Migraine with aura, intractable, with status migrainosus: Secondary | ICD-10-CM | POA: Diagnosis not present

## 2016-02-18 ENCOUNTER — Encounter (HOSPITAL_COMMUNITY): Payer: Self-pay

## 2016-02-18 ENCOUNTER — Ambulatory Visit (HOSPITAL_COMMUNITY)
Admission: RE | Admit: 2016-02-18 | Discharge: 2016-02-18 | Disposition: A | Discharge status: Home / Self Care | Payer: Medicare Other | Source: Ambulatory Visit | Attending: Internal Medicine | Admitting: Internal Medicine

## 2016-02-18 DIAGNOSIS — C3431 Malignant neoplasm of lower lobe, right bronchus or lung: Secondary | ICD-10-CM

## 2016-02-18 DIAGNOSIS — Z5111 Encounter for antineoplastic chemotherapy: Secondary | ICD-10-CM | POA: Diagnosis not present

## 2016-02-18 DIAGNOSIS — Z9889 Other specified postprocedural states: Secondary | ICD-10-CM | POA: Insufficient documentation

## 2016-02-18 DIAGNOSIS — C3491 Malignant neoplasm of unspecified part of right bronchus or lung: Secondary | ICD-10-CM | POA: Diagnosis not present

## 2016-02-18 MED ORDER — IOPAMIDOL (ISOVUE-300) INJECTION 61%
100.0000 mL | Freq: Once | INTRAVENOUS | Status: AC | PRN
  Administered 2016-02-18: 100 mL via INTRAVENOUS

## 2016-02-23 ENCOUNTER — Other Ambulatory Visit: Payer: Self-pay | Admitting: *Deleted

## 2016-02-23 ENCOUNTER — Encounter: Payer: Self-pay | Admitting: *Deleted

## 2016-02-23 DIAGNOSIS — C3431 Malignant neoplasm of lower lobe, right bronchus or lung: Secondary | ICD-10-CM

## 2016-02-23 MED ORDER — ERLOTINIB HCL 100 MG PO TABS: 100.0000 mg | ORAL_TABLET | Freq: Every day | ORAL | Status: DC

## 2016-02-24 ENCOUNTER — Encounter: Payer: Self-pay | Admitting: Internal Medicine

## 2016-02-24 ENCOUNTER — Other Ambulatory Visit (HOSPITAL_BASED_OUTPATIENT_CLINIC_OR_DEPARTMENT_OTHER): Payer: Medicare Other

## 2016-02-24 ENCOUNTER — Other Ambulatory Visit (HOSPITAL_COMMUNITY)
Admission: RE | Admit: 2016-02-24 | Discharge: 2016-02-24 | Disposition: A | Discharge status: Home / Self Care | Payer: Medicare Other | Source: Ambulatory Visit | Attending: Internal Medicine | Admitting: Internal Medicine

## 2016-02-24 ENCOUNTER — Ambulatory Visit (HOSPITAL_BASED_OUTPATIENT_CLINIC_OR_DEPARTMENT_OTHER): Payer: Medicare Other | Admitting: Internal Medicine

## 2016-02-24 ENCOUNTER — Encounter: Payer: Self-pay | Admitting: Medical Oncology

## 2016-02-24 VITALS — BP 103/61 | HR 70 | Temp 97.9°F | Resp 18 | Ht 72.0 in | Wt 239.6 lb

## 2016-02-24 DIAGNOSIS — Z006 Encounter for examination for normal comparison and control in clinical research program: Secondary | ICD-10-CM | POA: Diagnosis not present

## 2016-02-24 DIAGNOSIS — Z79899 Other long term (current) drug therapy: Secondary | ICD-10-CM | POA: Diagnosis not present

## 2016-02-24 DIAGNOSIS — Z5111 Encounter for antineoplastic chemotherapy: Secondary | ICD-10-CM

## 2016-02-24 DIAGNOSIS — C3431 Malignant neoplasm of lower lobe, right bronchus or lung: Secondary | ICD-10-CM

## 2016-02-24 LAB — COMPREHENSIVE METABOLIC PANEL WITH GFR
ALT: 28 U/L (ref 0–55)
AST: 30 U/L (ref 5–34)
Albumin: 3.7 g/dL (ref 3.5–5.0)
Alkaline Phosphatase: 62 U/L (ref 40–150)
Anion Gap: 10 meq/L (ref 3–11)
BUN: 15.2 mg/dL (ref 7.0–26.0)
CO2: 26 meq/L (ref 22–29)
Calcium: 9.5 mg/dL (ref 8.4–10.4)
Chloride: 105 meq/L (ref 98–109)
Creatinine: 1 mg/dL (ref 0.7–1.3)
EGFR: 74 ml/min/1.73 m2 — ABNORMAL LOW
Glucose: 100 mg/dL (ref 70–140)
Potassium: 3.8 meq/L (ref 3.5–5.1)
Sodium: 141 meq/L (ref 136–145)
Total Bilirubin: 0.4 mg/dL (ref 0.20–1.20)
Total Protein: 6.6 g/dL (ref 6.4–8.3)

## 2016-02-24 LAB — CBC WITH DIFFERENTIAL/PLATELET
BASO%: 0.3 % (ref 0.0–2.0)
BASOS ABS: 0 10*3/uL (ref 0.0–0.1)
EOS%: 2.7 % (ref 0.0–7.0)
Eosinophils Absolute: 0.2 10*3/uL (ref 0.0–0.5)
HCT: 37.9 % — ABNORMAL LOW (ref 38.4–49.9)
HEMOGLOBIN: 12.7 g/dL — AB (ref 13.0–17.1)
LYMPH#: 2.2 10*3/uL (ref 0.9–3.3)
LYMPH%: 37.4 % (ref 14.0–49.0)
MCH: 32.1 pg (ref 27.2–33.4)
MCHC: 33.5 g/dL (ref 32.0–36.0)
MCV: 95.7 fL (ref 79.3–98.0)
MONO#: 0.5 10*3/uL (ref 0.1–0.9)
MONO%: 9 % (ref 0.0–14.0)
NEUT%: 50.6 % (ref 39.0–75.0)
NEUTROS ABS: 3 10*3/uL (ref 1.5–6.5)
PLATELETS: 236 10*3/uL (ref 140–400)
RBC: 3.96 10*6/uL — ABNORMAL LOW (ref 4.20–5.82)
RDW: 13.2 % (ref 11.0–14.6)
WBC: 5.9 10*3/uL (ref 4.0–10.3)

## 2016-02-24 LAB — TSH: TSH: 6.611 m[IU]/L — ABNORMAL HIGH (ref 0.320–4.118)

## 2016-02-24 LAB — LACTATE DEHYDROGENASE: LDH: 179 U/L (ref 125–245)

## 2016-02-24 LAB — MAGNESIUM: MAGNESIUM: 2.2 mg/dL (ref 1.5–2.5)

## 2016-02-24 LAB — PHOSPHORUS: Phosphorus: 3.1 mg/dL (ref 2.5–4.6)

## 2016-02-24 MED FILL — TARCEVA 100 MG TABLET: 100 | 30 days supply | Qty: 30 | Fill #2

## 2016-02-24 NOTE — Progress Notes (Signed)
[id number]: Group D, Arm A; cycle 46 I met with patient and spouse today after patient's lab appointment, for research nurse Marcellus Scott, in exam room. Study required labs completed. Resting vitals completed, patient maintaining weight and oxygen saturation at 99% on room air. Patient here today for MD assessment prior to start of cycle 46 and review of recent CT scan. Concomitant medications reviewed with patient and he confirms stopping amoxicillin and Lodine on 02/22/16 as well as resolution of tooth ache and tooth infection on 02/22/2016. Patient reports no new adverse events and no changes since his last office visit. Patient reports to be doing well and with no concerns at this time. After MD's physical assessment of patient, review of his 02/18/16 CT scan and review of all labs including NCS labs, patient was cleared by MD to continue with study drug Tarceva. All patients questions answered to his satisfaction, patient denies further questions at this time. Patient was encouraged to call Dr. [name] with any questions or concern.   Patients TSH resulted elevated, per study protocol requirement, Free T3 and Free T4 labs ordered.  Adele Dan, RN, BSN Clinical Research 02/24/2016 8:44 AM

## 2016-02-24 NOTE — Progress Notes (Signed)
[place] Telephone:([phone]   Fax:(336) [phone]  OFFICE PROGRESS NOTE  [name],[name], MD 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 Roxan Hockey on 01/23/2006.  2) Tarceva 150 mg by mouth daily as part of the BMS checkmate 370 clinical trial. Status post 6 weeks of treatment.   CURRENT THERAPY: Tarceva 100 mg by mouth daily as part of the BMS checkmate 370 clinical trial. Started 07/17/2014 Status post 19 months of treatment.  DISEASE STAGE: Recurrent  CHEMOTHERAPY INTENT: Palliative.   CURRENT # OF CHEMOTHERAPY CYCLES: 20  CURRENT ANTIEMETICS: None  CURRENT SMOKING STATUS: Non-smoker  ORAL CHEMOTHERAPY AND CONSENT: None  CURRENT BISPHOSPHONATES USE: None  LIVING WILL AND CODE STATUS: Full code.  INTERVAL HISTORY: [name] 70 y.o. male returns to the clinic today for followup visit accompanied by his wife. The patient is doing fine with no complaints today. He continues to tolerate his treatment with Tarceva well. He denied having any skin rash or diarrhea. He denied having any significant chest pain, shortness of breath, cough or hemoptysis. The patient denied having any fever or chills, no vomiting, abdominal pain, or constipation. He has repeat CT scan of the chest, abdomen and pelvis and he is here today for evaluation and discussion of his scan results.  MEDICAL HISTORY: Past Medical History:  Diagnosis Date  . Cholelithiases 07/16/2015  . Complication of anesthesia 2007   quit breathing with bronchoscopy, had to spend night  . History of agent Orange exposure 44-45 yrs ago  . History of pneumonia  last  3-4 yrs ago   3 -4 different times  . Hypercholesterolemia   . Hypertension   . Hypothyroidism   . lung ca dx'd 2007   lung right  . Sinus disease    hx of  . Sleep apnea    uses cpap setting opf 12    ALLERGIES:  is allergic to lisinopril.  MEDICATIONS:  Current Outpatient Prescriptions  Medication Sig Dispense Refill  . amitriptyline (ELAVIL) 50 MG tablet Take 50 mg by mouth at bedtime. Reported on 06/18/2015    . aspirin 81 MG tablet Take 81 mg by mouth every morning.     . Cholecalciferol 2000 units CAPS Take 1 capsule by mouth daily.    . clindamycin (CLEOCIN-T) 1 % external solution Apply topically 2 (two) times daily. 240 mL 2  . clindamycin (CLINDAGEL) 1 % gel Apply 1 application topically 2 (two) times daily.    [name] diltiazem (CARDIZEM CD) 360 MG 24 hr capsule Take 360 mg by mouth every morning.     . erlotinib (TARCEVA) 100 MG tablet Take 1 tablet (100 mg total) by mouth daily. Take on an empty stomach 1 hour before meals or 2 hours after.    . levETIRAcetam (KEPPRA) 750 MG tablet     . levothyroxine (SYNTHROID, LEVOTHROID) 88 MCG tablet Take 88 mcg by mouth daily before breakfast.    . metoCLOPramide (REGLAN) 10 MG tablet Take 10 mg by mouth every 6 (six) hours as needed for nausea.     . metoprolol succinate (TOPROL-XL) 25 MG 24 hr tablet Take 25 mg by mouth every morning.     [name]  Multiple Vitamins-Minerals (CENTRUM SILVER PO) Take 1 tablet by mouth daily.     . naproxen sodium (ANAPROX) 550 MG tablet Take 550 mg by mouth 3 (three) times daily as needed for moderate pain. Reported on 08/27/2015    . polyethylene glycol (MIRALAX / GLYCOLAX) packet Take 17 g by mouth daily. Reported on 08/27/2015    . psyllium (METAMUCIL) 58.6 % powder Take 1 packet by mouth 3 (three) times daily. Reported on 08/27/2015    . rosuvastatin (CRESTOR) 5 MG tablet Take 5 mg by mouth every morning.     . valsartan-hydrochlorothiazide (DIOVAN-HCT) 320-25 MG per tablet Take 1 tablet by mouth every  morning.     . nitroGLYCERIN (NITROSTAT) 0.4 MG SL tablet Place 0.4 mg under the tongue every 5 (five) minutes as needed for chest pain. Reported on 10/22/2015     No current facility-administered medications for this visit.     SURGICAL HISTORY:  Past Surgical History:  Procedure Laterality Date  . COLONOSCOPY WITH PROPOFOL N/A 03/19/2013   Procedure: COLONOSCOPY WITH PROPOFOL;  Surgeon: Garlan Fair, MD;  Location: [facility];  Service: Endoscopy;  Laterality: N/A;  . Cystourethroscopy, [facility].  04/16/2010  . NASAL SINUS SURGERY  1987  . Right video-assisted thoracoscopy, right upper lobectomy and  01/23/2006  . The Olympus video bronchoscope was introduced via the right  12/21/2005  . TONSILLECTOMY  1957  . Torn medial and lateral menisci, left knee.  11/06/2006    REVIEW OF SYSTEMS:  A comprehensive review of systems was negative.   PHYSICAL EXAMINATION: General appearance: alert, cooperative and no distress Head: Normocephalic, without obvious abnormality, atraumatic Neck: no adenopathy Lymph nodes: Cervical, supraclavicular, and axillary nodes normal. Resp: clear to auscultation bilaterally Back: symmetric, no curvature. ROM normal. No CVA tenderness. Cardio: regular rate and rhythm, S1, S2 normal, no murmur, click, rub or gallop GI: soft, non-tender; bowel sounds normal; no masses,  no organomegaly Extremities: extremities normal, atraumatic, no cyanosis or edema Neurologic: Alert and oriented X 3, normal strength and tone. Normal symmetric reflexes. Normal coordination and gait    ECOG PERFORMANCE STATUS: 0 - Asymptomatic  Blood pressure 103/61, pulse 70, temperature 97.9 F (36.6 C), temperature source Oral, resp. rate 18, height 6' (1.829 m), weight 239 lb 9.6 oz (108.7 kg), SpO2 99 %.  LABORATORY DATA: Lab Results  Component Value Date   WBC 5.9 02/24/2016   HGB 12.7 (L) 02/24/2016   HCT 37.9 (L) 02/24/2016   MCV 95.7 02/24/2016   PLT 236 02/24/2016       Chemistry      Component Value Date/Time   NA 138 02/11/2016 0831   K 3.6 02/11/2016 0831   CL 107 07/17/2014 1000   CL 102 04/24/2012 0853   CO2 25 02/11/2016 0831   BUN 16.2 02/11/2016 0831   CREATININE 1.0 02/11/2016 0831      Component Value Date/Time   CALCIUM 9.6 02/11/2016 0831   ALKPHOS 71 02/11/2016 0831   AST 26 02/11/2016 0831   ALT 24 02/11/2016 0831   BILITOT 0.47 02/11/2016 0831       RADIOGRAPHIC STUDIES: Ct Chest W Contrast  Result Date: 02/18/2016 CLINICAL DATA:  Patient with history of right lung cancer diagnosed 2007. Recurrence in 2016. On chemotherapy. EXAM: CT CHEST, ABDOMEN, AND PELVIS WITH CONTRAST TECHNIQUE: Multidetector CT imaging of the chest, abdomen and pelvis was performed following the standard protocol during bolus administration of intravenous contrast. CONTRAST:  175m ISOVUE-300 IOPAMIDOL (ISOVUE-300) INJECTION 61% COMPARISON:  CT CAP 12/24/2015 FINDINGS: RECIST 1.1 Target Lesions: 1. Right lower lobe pulmonary nodule measures 1.8 cm (image 59; series 4), previously 1.8 cm. 2. Right lower lobe pulmonary nodule measures 1.3 cm (image 62; series 4), previously 1.3 cm. Non-target Lesions: 1. Sub solid right anterior lower lobe nodule-absent 2. Right lower lobe pulmonary nodule measures 0.7 cm (image 72; series 4), previously 0.7 cm. CT CHEST FINDINGS Cardiovascular: Normal heart size. No pericardial effusion. Aorta and main pulmonary artery normal in caliber. Mediastinum/Nodes: No enlarged axillary, mediastinal or hilar lymphadenopathy. Esophagus is unremarkable. Lungs/Pleura: Central airways are patent. Dependent atelectasis within the bilateral lower lobes. 0.7 cm subpleural right lower lobe nodule (image 72; series 4), unchanged from prior. 1.7 cm right lower lobe nodule (image 59; series 4), unchanged from prior. 1.3 cm right lower lobe nodule (image 62; series 4), unchanged from prior. No new or enlarging pulmonary nodules are identified. No  pleural effusion or pneumothorax. Stable postsurgical changes right hemi thorax. Musculoskeletal: No aggressive or acute appearing osseous lesions. CT ABDOMEN PELVIS FINDINGS Hepatobiliary: Liver is normal in size and contour. Stone within the gallbladder lumen. Pancreas: Unremarkable Spleen: Unremarkable Adrenals/Urinary Tract: The adrenal glands are normal. Kidneys enhance symmetrically with contrast. No hydronephrosis. Circumferential wall thickening of the urinary bladder. 1.7 cm exophytic cyst off the superior pole of the right kidney. Stomach/Bowel: Sigmoid colonic diverticulosis. No CT evidence for acute diverticulitis. Vascular/Lymphatic: Normal caliber abdominal aorta. Peripheral calcified atherosclerotic plaque. No retroperitoneal lymphadenopathy. Reproductive: Central dystrophic calcifications in the prostate. Other: Fat containing inguinal hernias, left-greater-than-right. Musculoskeletal: No aggressive or acute appearing osseous lesions. Lumbar spine degenerative changes. IMPRESSION: Unchanged pulmonary nodules. No evidence for progressive lung cancer. Stable postoperative changes right hemi thorax. Electronically Signed   By: Lovey Newcomer M.D.   On: 02/18/2016 12:59   Ct Abdomen Pelvis W Contrast  Result Date: 02/18/2016 CLINICAL DATA:  Patient with history of right lung cancer diagnosed 2007. Recurrence in 2016. On chemotherapy. EXAM: CT CHEST, ABDOMEN, AND PELVIS WITH CONTRAST TECHNIQUE: Multidetector CT imaging of the chest, abdomen and pelvis was performed following the standard protocol during bolus administration of intravenous contrast. CONTRAST:  124m ISOVUE-300 IOPAMIDOL (ISOVUE-300) INJECTION 61% COMPARISON:  CT CAP 12/24/2015 FINDINGS: RECIST 1.1 Target Lesions: 1. Right lower lobe pulmonary nodule measures 1.8 cm (image 59; series 4), previously 1.8 cm. 2. Right lower lobe pulmonary nodule measures 1.3 cm (image 62; series 4), previously 1.3 cm. Non-target Lesions: 1. Sub solid right  anterior lower lobe nodule-absent 2. Right lower lobe pulmonary nodule measures 0.7 cm (image 72; series 4), previously 0.7 cm. CT CHEST FINDINGS Cardiovascular: Normal heart size. No pericardial effusion. Aorta and main pulmonary artery normal in caliber. Mediastinum/Nodes: No enlarged axillary, mediastinal or hilar lymphadenopathy. Esophagus is unremarkable. Lungs/Pleura: Central airways are patent. Dependent atelectasis within the bilateral lower lobes. 0.7 cm subpleural right lower lobe nodule (image 72; series 4), unchanged from prior. 1.7 cm right lower lobe nodule (image 59; series 4), unchanged from prior. 1.3 cm right lower lobe nodule (image 62; series 4), unchanged from prior. No new or enlarging pulmonary nodules are identified. No pleural effusion or pneumothorax. Stable postsurgical changes right hemi thorax. Musculoskeletal: No aggressive or acute appearing osseous lesions. CT ABDOMEN PELVIS FINDINGS Hepatobiliary: Liver is normal in size and contour. Stone within the gallbladder lumen. Pancreas: Unremarkable Spleen: Unremarkable Adrenals/Urinary Tract: The adrenal glands are normal. Kidneys enhance symmetrically with contrast. No hydronephrosis. Circumferential wall thickening of the urinary bladder. 1.7 cm exophytic cyst off the superior pole of  the right kidney. Stomach/Bowel: Sigmoid colonic diverticulosis. No CT evidence for acute diverticulitis. Vascular/Lymphatic: Normal caliber abdominal aorta. Peripheral calcified atherosclerotic plaque. No retroperitoneal lymphadenopathy. Reproductive: Central dystrophic calcifications in the prostate. Other: Fat containing inguinal hernias, left-greater-than-right. Musculoskeletal: No aggressive or acute appearing osseous lesions. Lumbar spine degenerative changes. IMPRESSION: Unchanged pulmonary nodules. No evidence for progressive lung cancer. Stable postoperative changes right hemi thorax. Electronically Signed   By: Lovey Newcomer M.D.   On: 02/18/2016  12:59   ASSESSMENT AND PLAN: This is a very pleasant 70 years old white male with recurrent non-small cell lung cancer, adenocarcinoma with positive EGFR mutation in exon 21 that was initially diagnosed as stage IA non-small cell lung cancer status post right upper lobectomy and has been observation since October of 2007. He is status post 6 weeks treatment with Tarceva 150 mg by mouth daily according to the BMS checkmate 370 clinical trial. He is currently on Tarceva 100 mg by mouth daily Status post 19 months and and tolerating it fairly well with no significant adverse effects. The patient CT scan of the chest, abdomen and pelvis showed no evidence for disease progression. I discussed the scan results with the patient and his wife. I recommended for him to continue his treatment with Tarceva 100 mg by mouth daily. He would come back for follow-up visit in 2 weeks for reevaluation with repeat blood work.  The patient was advised to call immediately if he has any other concerning symptoms in the interval.  All questions were answered. The patient knows to call the clinic with any problems, questions or concerns. We can certainly see the patient much sooner if necessary.  Disclaimer: This note was dictated with voice recognition software. Similar sounding words can inadvertently be transcribed and may not be corrected upon review.

## 2016-02-25 LAB — T4, FREE: T4,Free(Direct): 1.26 ng/dL (ref 0.82–1.77)

## 2016-02-25 LAB — T3, FREE: T3 FREE: 2.5 pg/mL (ref 2.0–4.4)

## 2016-02-26 ENCOUNTER — Encounter: Payer: Self-pay | Admitting: *Deleted

## 2016-02-26 DIAGNOSIS — C3431 Malignant neoplasm of lower lobe, right bronchus or lung: Secondary | ICD-10-CM

## 2016-02-26 DIAGNOSIS — Z79899 Other long term (current) drug therapy: Secondary | ICD-10-CM

## 2016-02-26 NOTE — Progress Notes (Signed)
02/26/2016 0930 Spoke with Dr. [name] regarding patient's elevated TSH from 02/24/16 and normal T3 and T4 results from 02/24/16.  Dr. [name] said the elevated TSH was not related to erlotinib.  He said he was not concerned as it has been up and down for months.  Dr. [name] said this is managed by primary care and does not change his cancer care treatment.  Attempted to contact patient without success to give results of TSH and T3/T4. Marcellus Scott, RN, BSN, MHA, OCN

## 2016-03-01 ENCOUNTER — Telehealth: Payer: Self-pay | Admitting: *Deleted

## 2016-03-01 NOTE — Telephone Encounter (Signed)
03/01/2016 1040 Spoke with the patient by phone.  Informed him that his TSH on 02/24/16 was elevated and that the T3 and T4 were WNL.  Informed him that Dr. [name] said it is not related to erlotinib and that he should contact Dr. Inda Merlin and inform him of results.  The patient stated he would look up his results in "my chart" and that he would call Dr. Inda Merlin today.  He also stated he has an appointment with Dr. Inda Merlin sometime in December.  The patient thanked this RN for the call and had no questions. Marcellus Scott, RN, BSN, MHA, OCN

## 2016-03-07 DIAGNOSIS — E039 Hypothyroidism, unspecified: Secondary | ICD-10-CM | POA: Diagnosis not present

## 2016-03-07 DIAGNOSIS — J392 Other diseases of pharynx: Secondary | ICD-10-CM | POA: Diagnosis not present

## 2016-03-08 ENCOUNTER — Telehealth: Payer: Self-pay | Admitting: *Deleted

## 2016-03-08 NOTE — Telephone Encounter (Signed)
03/08/2016 1330 BMS 370 Group D Patient phone stating he saw Dr.[name] for his elevated TSH level.  He stated Dr. Inda Merlin increased the Synthroid dose and would continue to monitor as needed.  He stated he also was having some "phlegm" in his throat.  Patient stated Dr.[name] prescribed Pantoprazole for the phlegm and he wanted to know if he could take with the erlotinib before he fills the prescription.  This RN searched drug interactions with erlotinib and found the Pantoprazole listed as a drug that could interfere with the absorption or erlotinib.  This RN also confirmed with pharmacist, [name].  The patient was instructed not to take the Pantoprazole and explained the reason why.  The patient stated the "phlegm" was not so bad that he couldn't wait until he sees Dr. [name] on 03/10/16 to discuss further.  This RN thanked the patient for the call.  The patient denied having any further questions. Marcellus Scott, RN, BSN, MHA, OCN

## 2016-03-09 ENCOUNTER — Encounter: Payer: Self-pay | Admitting: Internal Medicine

## 2016-03-09 ENCOUNTER — Encounter: Payer: Self-pay | Admitting: *Deleted

## 2016-03-09 ENCOUNTER — Other Ambulatory Visit: Payer: Self-pay | Admitting: *Deleted

## 2016-03-10 ENCOUNTER — Ambulatory Visit (HOSPITAL_BASED_OUTPATIENT_CLINIC_OR_DEPARTMENT_OTHER): Payer: Medicare Other | Admitting: Internal Medicine

## 2016-03-10 ENCOUNTER — Other Ambulatory Visit: Payer: Self-pay | Admitting: *Deleted

## 2016-03-10 ENCOUNTER — Other Ambulatory Visit (HOSPITAL_COMMUNITY)
Admission: RE | Admit: 2016-03-10 | Discharge: 2016-03-10 | Disposition: A | Discharge status: Home / Self Care | Payer: Medicare Other | Source: Ambulatory Visit | Attending: Internal Medicine | Admitting: Internal Medicine

## 2016-03-10 ENCOUNTER — Other Ambulatory Visit (HOSPITAL_BASED_OUTPATIENT_CLINIC_OR_DEPARTMENT_OTHER): Payer: Medicare Other

## 2016-03-10 ENCOUNTER — Encounter: Payer: Self-pay | Admitting: Internal Medicine

## 2016-03-10 ENCOUNTER — Encounter: Payer: Self-pay | Admitting: Medical Oncology

## 2016-03-10 VITALS — BP 117/76 | HR 68 | Temp 97.6°F | Resp 18 | Ht 72.0 in | Wt 240.7 lb

## 2016-03-10 DIAGNOSIS — C3431 Malignant neoplasm of lower lobe, right bronchus or lung: Secondary | ICD-10-CM

## 2016-03-10 DIAGNOSIS — Z006 Encounter for examination for normal comparison and control in clinical research program: Secondary | ICD-10-CM

## 2016-03-10 DIAGNOSIS — E039 Hypothyroidism, unspecified: Secondary | ICD-10-CM

## 2016-03-10 DIAGNOSIS — Z5111 Encounter for antineoplastic chemotherapy: Secondary | ICD-10-CM

## 2016-03-10 LAB — CBC WITH DIFFERENTIAL/PLATELET
BASO%: 0.3 % (ref 0.0–2.0)
Basophils Absolute: 0 10*3/uL (ref 0.0–0.1)
EOS%: 2.9 % (ref 0.0–7.0)
Eosinophils Absolute: 0.2 10*3/uL (ref 0.0–0.5)
HCT: 38.6 % (ref 38.4–49.9)
HGB: 12.9 g/dL — ABNORMAL LOW (ref 13.0–17.1)
LYMPH%: 39.1 % (ref 14.0–49.0)
MCH: 32.2 pg (ref 27.2–33.4)
MCHC: 33.4 g/dL (ref 32.0–36.0)
MCV: 96.3 fL (ref 79.3–98.0)
MONO#: 0.7 10*3/uL (ref 0.1–0.9)
MONO%: 11.8 % (ref 0.0–14.0)
NEUT%: 45.9 % (ref 39.0–75.0)
NEUTROS ABS: 2.7 10*3/uL (ref 1.5–6.5)
PLATELETS: 224 10*3/uL (ref 140–400)
RBC: 4.01 10*6/uL — AB (ref 4.20–5.82)
RDW: 13.1 % (ref 11.0–14.6)
WBC: 5.9 10*3/uL (ref 4.0–10.3)
lymph#: 2.3 10*3/uL (ref 0.9–3.3)

## 2016-03-10 LAB — COMPREHENSIVE METABOLIC PANEL
ALT: 25 U/L (ref 0–55)
ANION GAP: 9 meq/L (ref 3–11)
AST: 25 U/L (ref 5–34)
Albumin: 3.8 g/dL (ref 3.5–5.0)
Alkaline Phosphatase: 65 U/L (ref 40–150)
BUN: 15.2 mg/dL (ref 7.0–26.0)
CHLORIDE: 103 meq/L (ref 98–109)
CO2: 28 meq/L (ref 22–29)
Calcium: 9.5 mg/dL (ref 8.4–10.4)
Creatinine: 1.1 mg/dL (ref 0.7–1.3)
EGFR: 66 mL/min/{1.73_m2} — AB (ref >90)
GLUCOSE: 104 mg/dL (ref 70–140)
Potassium: 4.4 mEq/L (ref 3.5–5.1)
SODIUM: 140 meq/L (ref 136–145)
TOTAL PROTEIN: 7 g/dL (ref 6.4–8.3)
Total Bilirubin: 0.43 mg/dL (ref 0.20–1.20)

## 2016-03-10 LAB — MAGNESIUM: MAGNESIUM: 2.4 mg/dL (ref 1.5–2.5)

## 2016-03-10 LAB — LACTATE DEHYDROGENASE: LDH: 182 U/L (ref 125–245)

## 2016-03-10 LAB — PHOSPHORUS: PHOSPHORUS: 3.2 mg/dL (ref 2.5–4.6)

## 2016-03-10 NOTE — Progress Notes (Signed)
[facility] [phone]   Fax:([phone]  OFFICE PROGRESS NOTE  [name],[name], MD 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  PRIOR THERAPY: 1) Status post right upper lobectomy under the care of Dr. Roxan Hockey on 01/23/2006.  2) Tarceva 150 mg by mouth daily as part of the BMS checkmate 370 clinical trial. Status post 6 weeks of treatment.  CURRENT THERAPY: Tarceva 100 mg by mouth daily as part of the BMS Checkmate 370 clinical trial. First dose started 07/17/2014 status post 20 months of treatment.  INTERVAL HISTORY: [name] 70 y.o. male returns to the clinic today for 2 weeks follow-up visit accompanied by his wife. The patient is feeling fine today with no specific complaints. He was seen recently by his primary care physician Dr. Inda Merlin and his dose of Synthroid was increased because of the elevated TSH. He also has question about taking Protonix with Tarceva. The patient denied having any chest pain, shortness breath, cough or hemoptysis. He has no fever or chills. He has no nausea or vomiting. He denied having any significant chest pain, shortness of breath, cough or hemoptysis. He is here today for evaluation and repeat blood work.  MEDICAL HISTORY: Past Medical History:  Diagnosis Date  . Cholelithiases 07/16/2015  . Complication of anesthesia 2007   quit breathing with bronchoscopy, had to spend night  . History of agent Orange exposure 44-45 yrs ago  . History of pneumonia  last 3-4 yrs ago   3 -4 different times  . Hypercholesterolemia   . Hypertension   . Hypothyroidism   . lung ca dx'd 2007   lung right  . Sinus disease    hx of  . Sleep apnea    uses cpap setting opf 12    ALLERGIES:  is allergic to lisinopril.  MEDICATIONS:    Current Outpatient Prescriptions  Medication Sig Dispense Refill  . amitriptyline (ELAVIL) 50 MG tablet Take 50 mg by mouth at bedtime. Reported on 06/18/2015    . aspirin 81 MG tablet Take 81 mg by mouth every morning.     . Cholecalciferol 2000 units CAPS Take 1 capsule by mouth daily.    . clindamycin (CLEOCIN-T) 1 % external solution Apply topically 2 (two) times daily. 240 mL 2  . clindamycin (CLINDAGEL) 1 % gel Apply 1 application topically 2 (two) times daily.    [name] diltiazem (CARDIZEM CD) 360 MG 24 hr capsule Take 360 mg by mouth every morning.     . erlotinib (TARCEVA) 100 MG tablet Take 1 tablet (100 mg total) by mouth daily. Take on an empty stomach 1 hour before meals or 2 hours after.    . levETIRAcetam (KEPPRA) 750 MG tablet     . metoCLOPramide (REGLAN) 10 MG tablet Take 10 mg by mouth every 6 (six) hours as needed for nausea.     . metoprolol succinate (TOPROL-XL) 25 MG 24 hr tablet Take 25 mg by mouth every morning.     . Multiple Vitamins-Minerals (CENTRUM SILVER PO) Take 1 tablet by mouth daily.     . polyethylene glycol (MIRALAX / GLYCOLAX) packet Take 17 g by mouth daily. Reported on 08/27/2015    . psyllium (METAMUCIL) 58.6 % powder Take 1 packet by mouth 3 (three) times daily. Reported on 08/27/2015    .  rosuvastatin (CRESTOR) 5 MG tablet Take 5 mg by mouth every morning.     [name] SYNTHROID 112 MCG tablet Take 112 mcg by mouth daily.    . valsartan-hydrochlorothiazide (DIOVAN-HCT) 320-25 MG per tablet Take 1 tablet by mouth every morning.     . naproxen sodium (ANAPROX) 550 MG tablet Take 550 mg by mouth 3 (three) times daily as needed for moderate pain. Reported on 08/27/2015    . nitroGLYCERIN (NITROSTAT) 0.4 MG SL tablet Place 0.4 mg under the tongue every 5 (five) minutes as needed for chest pain. Reported on 10/22/2015    . pantoprazole (PROTONIX) 40 MG tablet Take 40 mg by mouth daily.     No current facility-administered medications for this visit.     SURGICAL  HISTORY:  Past Surgical History:  Procedure Laterality Date  . COLONOSCOPY WITH PROPOFOL N/A 03/19/2013   Procedure: COLONOSCOPY WITH PROPOFOL;  Surgeon: Garlan Fair, MD;  Location: [facility];  Service: Endoscopy;  Laterality: N/A;  . Cystourethroscopy, [facility].  04/16/2010  . NASAL SINUS SURGERY  1987  . Right video-assisted thoracoscopy, right upper lobectomy and  01/23/2006  . The Olympus video bronchoscope was introduced via the right  12/21/2005  . TONSILLECTOMY  1957  . Torn medial and lateral menisci, left knee.  11/06/2006    REVIEW OF SYSTEMS:  A comprehensive review of systems was negative.   PHYSICAL EXAMINATION: General appearance: alert, cooperative and no distress Head: Normocephalic, without obvious abnormality, atraumatic Neck: no adenopathy, no JVD, supple, symmetrical, trachea midline and thyroid not enlarged, symmetric, no tenderness/mass/nodules Lymph nodes: Cervical, supraclavicular, and axillary nodes normal. Resp: clear to auscultation bilaterally Back: symmetric, no curvature. ROM normal. No CVA tenderness. Cardio: regular rate and rhythm, S1, S2 normal, no murmur, click, rub or gallop GI: soft, non-tender; bowel sounds normal; no masses,  no organomegaly Extremities: extremities normal, atraumatic, no cyanosis or edema  ECOG PERFORMANCE STATUS: 0 - Asymptomatic  Blood pressure 117/76, pulse 68, temperature 97.6 F (36.4 C), temperature source Oral, resp. rate 18, height 6' (1.829 m), weight 240 lb 11.2 oz (109.2 kg), SpO2 99 %.  LABORATORY DATA: Lab Results  Component Value Date   WBC 5.9 03/10/2016   HGB 12.9 (L) 03/10/2016   HCT 38.6 03/10/2016   MCV 96.3 03/10/2016   PLT 224 03/10/2016      Chemistry      Component Value Date/Time   NA 141 02/24/2016 0743   K 3.8 02/24/2016 0743   CL 107 07/17/2014 1000   CL 102 04/24/2012 0853   CO2 26 02/24/2016 0743   BUN 15.2 02/24/2016 0743   CREATININE 1.0 02/24/2016 0743      Component  Value Date/Time   CALCIUM 9.5 02/24/2016 0743   ALKPHOS 62 02/24/2016 0743   AST 30 02/24/2016 0743   ALT 28 02/24/2016 0743   BILITOT 0.40 02/24/2016 0743       RADIOGRAPHIC STUDIES: Ct Chest W Contrast  Result Date: 02/18/2016 CLINICAL DATA:  Patient with history of right lung cancer diagnosed 2007. Recurrence in 2016. On chemotherapy. EXAM: CT CHEST, ABDOMEN, AND PELVIS WITH CONTRAST TECHNIQUE: Multidetector CT imaging of the chest, abdomen and pelvis was performed following the standard protocol during bolus administration of intravenous contrast. CONTRAST:  12m ISOVUE-300 IOPAMIDOL (ISOVUE-300) INJECTION 61% COMPARISON:  CT CAP 12/24/2015 FINDINGS: RECIST 1.1 Target Lesions: 1. Right lower lobe pulmonary nodule measures 1.8 cm (image 59; series 4), previously 1.8 cm. 2. Right lower lobe pulmonary nodule measures 1.3 cm (  image 62; series 4), previously 1.3 cm. Non-target Lesions: 1. Sub solid right anterior lower lobe nodule-absent 2. Right lower lobe pulmonary nodule measures 0.7 cm (image 72; series 4), previously 0.7 cm. CT CHEST FINDINGS Cardiovascular: Normal heart size. No pericardial effusion. Aorta and main pulmonary artery normal in caliber. Mediastinum/Nodes: No enlarged axillary, mediastinal or hilar lymphadenopathy. Esophagus is unremarkable. Lungs/Pleura: Central airways are patent. Dependent atelectasis within the bilateral lower lobes. 0.7 cm subpleural right lower lobe nodule (image 72; series 4), unchanged from prior. 1.7 cm right lower lobe nodule (image 59; series 4), unchanged from prior. 1.3 cm right lower lobe nodule (image 62; series 4), unchanged from prior. No new or enlarging pulmonary nodules are identified. No pleural effusion or pneumothorax. Stable postsurgical changes right hemi thorax. Musculoskeletal: No aggressive or acute appearing osseous lesions. CT ABDOMEN PELVIS FINDINGS Hepatobiliary: Liver is normal in size and contour. Stone within the gallbladder lumen.  Pancreas: Unremarkable Spleen: Unremarkable Adrenals/Urinary Tract: The adrenal glands are normal. Kidneys enhance symmetrically with contrast. No hydronephrosis. Circumferential wall thickening of the urinary bladder. 1.7 cm exophytic cyst off the superior pole of the right kidney. Stomach/Bowel: Sigmoid colonic diverticulosis. No CT evidence for acute diverticulitis. Vascular/Lymphatic: Normal caliber abdominal aorta. Peripheral calcified atherosclerotic plaque. No retroperitoneal lymphadenopathy. Reproductive: Central dystrophic calcifications in the prostate. Other: Fat containing inguinal hernias, left-greater-than-right. Musculoskeletal: No aggressive or acute appearing osseous lesions. Lumbar spine degenerative changes. IMPRESSION: Unchanged pulmonary nodules. No evidence for progressive lung cancer. Stable postoperative changes right hemi thorax. Electronically Signed   By: Lovey Newcomer M.D.   On: 02/18/2016 12:59   Ct Abdomen Pelvis W Contrast  Result Date: 02/18/2016 CLINICAL DATA:  Patient with history of right lung cancer diagnosed 2007. Recurrence in 2016. On chemotherapy. EXAM: CT CHEST, ABDOMEN, AND PELVIS WITH CONTRAST TECHNIQUE: Multidetector CT imaging of the chest, abdomen and pelvis was performed following the standard protocol during bolus administration of intravenous contrast. CONTRAST:  145m ISOVUE-300 IOPAMIDOL (ISOVUE-300) INJECTION 61% COMPARISON:  CT CAP 12/24/2015 FINDINGS: RECIST 1.1 Target Lesions: 1. Right lower lobe pulmonary nodule measures 1.8 cm (image 59; series 4), previously 1.8 cm. 2. Right lower lobe pulmonary nodule measures 1.3 cm (image 62; series 4), previously 1.3 cm. Non-target Lesions: 1. Sub solid right anterior lower lobe nodule-absent 2. Right lower lobe pulmonary nodule measures 0.7 cm (image 72; series 4), previously 0.7 cm. CT CHEST FINDINGS Cardiovascular: Normal heart size. No pericardial effusion. Aorta and main pulmonary artery normal in caliber.  Mediastinum/Nodes: No enlarged axillary, mediastinal or hilar lymphadenopathy. Esophagus is unremarkable. Lungs/Pleura: Central airways are patent. Dependent atelectasis within the bilateral lower lobes. 0.7 cm subpleural right lower lobe nodule (image 72; series 4), unchanged from prior. 1.7 cm right lower lobe nodule (image 59; series 4), unchanged from prior. 1.3 cm right lower lobe nodule (image 62; series 4), unchanged from prior. No new or enlarging pulmonary nodules are identified. No pleural effusion or pneumothorax. Stable postsurgical changes right hemi thorax. Musculoskeletal: No aggressive or acute appearing osseous lesions. CT ABDOMEN PELVIS FINDINGS Hepatobiliary: Liver is normal in size and contour. Stone within the gallbladder lumen. Pancreas: Unremarkable Spleen: Unremarkable Adrenals/Urinary Tract: The adrenal glands are normal. Kidneys enhance symmetrically with contrast. No hydronephrosis. Circumferential wall thickening of the urinary bladder. 1.7 cm exophytic cyst off the superior pole of the right kidney. Stomach/Bowel: Sigmoid colonic diverticulosis. No CT evidence for acute diverticulitis. Vascular/Lymphatic: Normal caliber abdominal aorta. Peripheral calcified atherosclerotic plaque. No retroperitoneal lymphadenopathy. Reproductive: Central dystrophic calcifications in the prostate. Other:  Fat containing inguinal hernias, left-greater-than-right. Musculoskeletal: No aggressive or acute appearing osseous lesions. Lumbar spine degenerative changes. IMPRESSION: Unchanged pulmonary nodules. No evidence for progressive lung cancer. Stable postoperative changes right hemi thorax. Electronically Signed   By: Lovey Newcomer M.D.   On: 02/18/2016 12:59    ASSESSMENT AND PLAN: This is a very pleasant 70 years old white male with recurrent non-small cell lung cancer and positive EGFR mutation in exon 21. The patient is currently on Tarceva 100 mg by mouth daily and tolerating it fairly well. He is  status post 20 months of treatment. I recommended for the patient to continue with his treatment with Tarceva. He would come back for follow-up visit in 2 weeks for reevaluation with repeat blood work. For the hypothyroidism, he will continue treatment with Synthroid by his primary care physician. Regarding the use of Protonix with Tarceva, I advised the patient not use Protonix because of effect on absorption but he can use Pepcid or Zantac 2 hours after and 10 hours before his dose of Tarceva. The patient voices understanding of current disease status and treatment options and is in agreement with the current care plan.  All questions were answered. The patient knows to call the clinic with any problems, questions or concerns. We can certainly see the patient much sooner if necessary.  I spent 10 minutes counseling the patient face to face. The total time spent in the appointment was 15 minutes.  Disclaimer: This note was dictated with voice recognition software. Similar sounding words can inadvertently be transcribed and may not be corrected upon review.

## 2016-03-10 NOTE — Progress Notes (Signed)
BMS 370 Group D. Patient in today for Cycle 47. I met with patient and spouse, for research nurse Marcellus Scott, after study required labs completed. Patient's resting vitals obtained, patient maintaining weight and resting O2 saturation at 99%. Patient reports an increase in his synthroid medication to 112 mcg,  by Dr. Inda Merlin, starting Tuesday December 5th. For patient's complaint of "phlegm," Dr [name] recommended Pepcid for patient to take with the instructions to take twice a day, two hours after he's taken the tarceva and 10 hours before he takes his tarceva. Patient gave verbal understanding. Patient denies any new issues or concerns today. Per Dr. Worthy Flank physical assessment of patient and review of all of patient's labs, including NCS labs, patient cleared to continue with tarceva. All patients questions answered to his satisfaction, patient knows to return in two weeks for labs and MD appointments. Adele Dan, RN, BSN Clinical Research 03/10/2016 10:55 AM

## 2016-03-23 ENCOUNTER — Encounter: Payer: Self-pay | Admitting: Internal Medicine

## 2016-03-23 ENCOUNTER — Encounter: Payer: Self-pay | Admitting: *Deleted

## 2016-03-23 DIAGNOSIS — C3431 Malignant neoplasm of lower lobe, right bronchus or lung: Secondary | ICD-10-CM

## 2016-03-24 ENCOUNTER — Ambulatory Visit (HOSPITAL_BASED_OUTPATIENT_CLINIC_OR_DEPARTMENT_OTHER): Payer: Medicare Other | Admitting: Internal Medicine

## 2016-03-24 ENCOUNTER — Other Ambulatory Visit: Payer: Self-pay | Admitting: Internal Medicine

## 2016-03-24 ENCOUNTER — Telehealth: Payer: Self-pay | Admitting: Internal Medicine

## 2016-03-24 ENCOUNTER — Encounter: Payer: Self-pay | Admitting: Internal Medicine

## 2016-03-24 ENCOUNTER — Other Ambulatory Visit (HOSPITAL_COMMUNITY)
Admission: RE | Admit: 2016-03-24 | Discharge: 2016-03-24 | Disposition: A | Discharge status: Home / Self Care | Payer: Medicare Other | Source: Ambulatory Visit | Attending: Internal Medicine | Admitting: Internal Medicine

## 2016-03-24 ENCOUNTER — Other Ambulatory Visit (HOSPITAL_BASED_OUTPATIENT_CLINIC_OR_DEPARTMENT_OTHER): Payer: Medicare Other

## 2016-03-24 VITALS — BP 121/69 | HR 79 | Temp 97.6°F | Resp 18 | Ht 72.0 in | Wt 243.6 lb

## 2016-03-24 DIAGNOSIS — E039 Hypothyroidism, unspecified: Secondary | ICD-10-CM

## 2016-03-24 DIAGNOSIS — C3431 Malignant neoplasm of lower lobe, right bronchus or lung: Secondary | ICD-10-CM | POA: Insufficient documentation

## 2016-03-24 DIAGNOSIS — Z5111 Encounter for antineoplastic chemotherapy: Secondary | ICD-10-CM

## 2016-03-24 DIAGNOSIS — Z006 Encounter for examination for normal comparison and control in clinical research program: Secondary | ICD-10-CM

## 2016-03-24 LAB — CBC WITH DIFFERENTIAL/PLATELET
BASO%: 0.4 % (ref 0.0–2.0)
BASOS ABS: 0 10*3/uL (ref 0.0–0.1)
EOS%: 3.2 % (ref 0.0–7.0)
Eosinophils Absolute: 0.2 10*3/uL (ref 0.0–0.5)
HCT: 37.8 % — ABNORMAL LOW (ref 38.4–49.9)
HGB: 12.8 g/dL — ABNORMAL LOW (ref 13.0–17.1)
LYMPH%: 37.5 % (ref 14.0–49.0)
MCH: 32.2 pg (ref 27.2–33.4)
MCHC: 33.9 g/dL (ref 32.0–36.0)
MCV: 95.2 fL (ref 79.3–98.0)
MONO#: 0.6 10*3/uL (ref 0.1–0.9)
MONO%: 11.2 % (ref 0.0–14.0)
NEUT#: 2.5 10*3/uL (ref 1.5–6.5)
NEUT%: 47.7 % (ref 39.0–75.0)
Platelets: 211 10*3/uL (ref 140–400)
RBC: 3.97 10*6/uL — AB (ref 4.20–5.82)
RDW: 13.1 % (ref 11.0–14.6)
WBC: 5.3 10*3/uL (ref 4.0–10.3)
lymph#: 2 10*3/uL (ref 0.9–3.3)

## 2016-03-24 LAB — COMPREHENSIVE METABOLIC PANEL
ALT: 24 U/L (ref 0–55)
AST: 25 U/L (ref 5–34)
Albumin: 3.9 g/dL (ref 3.5–5.0)
Alkaline Phosphatase: 61 U/L (ref 40–150)
Anion Gap: 7 mEq/L (ref 3–11)
BUN: 14.8 mg/dL (ref 7.0–26.0)
CALCIUM: 9.5 mg/dL (ref 8.4–10.4)
CHLORIDE: 104 meq/L (ref 98–109)
CO2: 28 meq/L (ref 22–29)
Creatinine: 1 mg/dL (ref 0.7–1.3)
EGFR: 72 mL/min/{1.73_m2} — ABNORMAL LOW (ref >90)
GLUCOSE: 97 mg/dL (ref 70–140)
POTASSIUM: 3.7 meq/L (ref 3.5–5.1)
SODIUM: 139 meq/L (ref 136–145)
Total Bilirubin: 0.5 mg/dL (ref 0.20–1.20)
Total Protein: 6.6 g/dL (ref 6.4–8.3)

## 2016-03-24 LAB — MAGNESIUM: Magnesium: 2.2 mg/dl (ref 1.5–2.5)

## 2016-03-24 LAB — LACTATE DEHYDROGENASE: LDH: 166 U/L (ref 125–245)

## 2016-03-24 LAB — PHOSPHORUS: Phosphorus: 2.7 mg/dL (ref 2.5–4.6)

## 2016-03-24 MED ORDER — ERLOTINIB HCL 100 MG PO TABS: 100.0000 mg | ORAL_TABLET | Freq: Every day | ORAL | 2 refills | Status: DC

## 2016-03-24 NOTE — Progress Notes (Signed)
03/24/2016 1215  BMS 370 cycle 48, week 94 The patient returns to the clinic accompanied by his wife.  He states he has had a good last couple of weeks.  Resting O2 sat and VS were obtained, along with weight.  He denies having any new side effects since his last visit.  He continues with intermittent rash.  He continues with SOB with exertion only.  His toe fungus remains the same.  He says he gets a little tired but nothing major or new.  He says his GE reflux is intermittent and that he started taking Zantac for it on 03/10/16.  He said it has helped.   He continues to take his erlotinib daily as prescribed and states he has not missed any doses. Explained to patient that he will have a different research nurse after his 04/07/16 visit.  This RN introduced Tyrell Antonio, RN to patient and gave him her business card with contact information. Dr. [name] was in to perform a targeted physical exam.  Based on lab results review and exam, the patient was cleared to continue treatment with erlotinib. Dr. [name] and the patient were both informed of the study update: Since the C1D1 visit occurred and erlotinib dosing was initiated on 04Mar2016,  the 24 month date will be [id number] (i.e. latest date the patient can receive treatment in the Treatment Phase of this study)   Also, even though the erlotinib can no longer be given in the Treatment Phase of this study after the maximum of 24 months, if Dr. [name] finds it in the best interest of the patient and wishes the patient to remain on erlotinib, the patient can receive erlotinib (or other desired regimen) while in the Follow-up Phase for the BMS 370 study.   Dr. [name] denied any questions regarding above and the patient verbalized understanding. Dr, [name] was also informed the patient is due another CT scan per RECIST on 04/14/2016.  The patient had no questions.  This RN thanked the patient for his time. Marcellus Scott, RN, BSN, MHA, OCN

## 2016-03-24 NOTE — Progress Notes (Signed)
[place] Telephone:(336) [phone]   Fax:(336) [phone]  OFFICE PROGRESS NOTE  [name],[name], MD 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  PRIOR THERAPY: 1) Status post right upper lobectomy under the care of Dr. Roxan Hockey on 01/23/2006.  2) Tarceva 150 mg by mouth daily as part of the BMS checkmate 370 clinical trial. Status post 6 weeks of treatment.  CURRENT THERAPY: Tarceva 100 mg by mouth daily as part of the BMS Checkmate 370 clinical trial. First dose started 07/17/2014 status post 21 months of treatment.  INTERVAL HISTORY: [name] 70 y.o. male came to the clinic today for follow-up visit accompanied by his wife. The patient is currently on treatment with oral Tarceva and tolerating it well. He denied having any skin rash or diarrhea. He has no chest pain, shortness of breath, cough or hemoptysis. The patient denied having any significant weight loss or night sweats. He has no fever or chills. He is here today for evaluation and repeat blood work.  MEDICAL HISTORY: Past Medical History:  Diagnosis Date  . Cholelithiases 07/16/2015  . Complication of anesthesia 2007   quit breathing with bronchoscopy, had to spend night  . History of agent Orange exposure 44-45 yrs ago  . History of pneumonia  last 3-4 yrs ago   3 -4 different times  . Hypercholesterolemia   . Hypertension   . Hypothyroidism   . lung ca dx'd 2007   lung right  . Sinus disease    hx of  . Sleep apnea    uses cpap setting opf 12    ALLERGIES:  is allergic to lisinopril.  MEDICATIONS:  Current Outpatient Prescriptions  Medication Sig Dispense Refill  . amitriptyline (ELAVIL) 50 MG tablet Take 50 mg by mouth at bedtime. Reported on 06/18/2015    . aspirin 81 MG tablet Take 81 mg by  mouth every morning.     . Cholecalciferol 2000 units CAPS Take 1 capsule by mouth daily.    . clindamycin (CLEOCIN-T) 1 % external solution Apply topically 2 (two) times daily. 240 mL 2  . clindamycin (CLINDAGEL) 1 % gel Apply 1 application topically 2 (two) times daily.    [name] diltiazem (CARDIZEM CD) 360 MG 24 hr capsule Take 360 mg by mouth every morning.     . erlotinib (TARCEVA) 100 MG tablet Take 1 tablet (100 mg total) by mouth daily. Take on an empty stomach 1 hour before meals or 2 hours after.    . levETIRAcetam (KEPPRA) 750 MG tablet     . metoCLOPramide (REGLAN) 10 MG tablet Take 10 mg by mouth every 6 (six) hours as needed for nausea.     . metoprolol succinate (TOPROL-XL) 25 MG 24 hr tablet Take 25 mg by mouth every morning.     . Multiple Vitamins-Minerals (CENTRUM SILVER PO) Take 1 tablet by mouth daily.     . naproxen sodium (ANAPROX) 550 MG tablet Take 550 mg by mouth 3 (three) times daily as needed for moderate pain. Reported on 08/27/2015    . nitroGLYCERIN (NITROSTAT) 0.4 MG SL tablet Place 0.4 mg under the tongue every 5 (five) minutes as needed for chest pain. Reported on 10/22/2015    . polyethylene glycol (MIRALAX / GLYCOLAX) packet Take 17 g by mouth daily. Reported on  08/27/2015    . psyllium (METAMUCIL) 58.6 % powder Take 1 packet by mouth 3 (three) times daily. Reported on 08/27/2015    . ranitidine (ZANTAC) 75 MG tablet Take 75 mg by mouth 2 (two) times daily.    . rosuvastatin (CRESTOR) 5 MG tablet Take 5 mg by mouth every morning.     [name] SYNTHROID 112 MCG tablet Take 112 mcg by mouth daily.    . valsartan-hydrochlorothiazide (DIOVAN-HCT) 320-25 MG per tablet Take 1 tablet by mouth every morning.      No current facility-administered medications for this visit.     SURGICAL HISTORY:  Past Surgical History:  Procedure Laterality Date  . COLONOSCOPY WITH PROPOFOL N/A 03/19/2013   Procedure: COLONOSCOPY WITH PROPOFOL;  Surgeon: Garlan Fair, MD;  Location: [facility]  ENDOSCOPY;  Service: Endoscopy;  Laterality: N/A;  . Cystourethroscopy, [facility].  04/16/2010  . NASAL SINUS SURGERY  1987  . Right video-assisted thoracoscopy, right upper lobectomy and  01/23/2006  . The Olympus video bronchoscope was introduced via the right  12/21/2005  . TONSILLECTOMY  1957  . Torn medial and lateral menisci, left knee.  11/06/2006    REVIEW OF SYSTEMS:  A comprehensive review of systems was negative.   PHYSICAL EXAMINATION: General appearance: alert, cooperative and no distress Head: Normocephalic, without obvious abnormality, atraumatic Neck: no adenopathy, no JVD, supple, symmetrical, trachea midline and thyroid not enlarged, symmetric, no tenderness/mass/nodules Lymph nodes: Cervical, supraclavicular, and axillary nodes normal. Resp: clear to auscultation bilaterally Back: symmetric, no curvature. ROM normal. No CVA tenderness. Cardio: regular rate and rhythm, S1, S2 normal, no murmur, click, rub or gallop GI: soft, non-tender; bowel sounds normal; no masses,  no organomegaly Extremities: extremities normal, atraumatic, no cyanosis or edema  ECOG PERFORMANCE STATUS: 0 - Asymptomatic  Blood pressure 121/69, pulse 79, temperature 97.6 F (36.4 C), temperature source Oral, resp. rate 18, height 6' (1.829 m), weight 243 lb 9.6 oz (110.5 kg), SpO2 99 %.  LABORATORY DATA: Lab Results  Component Value Date   WBC 5.3 03/24/2016   HGB 12.8 (L) 03/24/2016   HCT 37.8 (L) 03/24/2016   MCV 95.2 03/24/2016   PLT 211 03/24/2016      Chemistry      Component Value Date/Time   NA 139 03/24/2016 1050   K 3.7 03/24/2016 1050   CL 107 07/17/2014 1000   CL 102 04/24/2012 0853   CO2 28 03/24/2016 1050   BUN 14.8 03/24/2016 1050   CREATININE 1.0 03/24/2016 1050      Component Value Date/Time   CALCIUM 9.5 03/24/2016 1050   ALKPHOS 61 03/24/2016 1050   AST 25 03/24/2016 1050   ALT 24 03/24/2016 1050   BILITOT 0.50 03/24/2016 1050       RADIOGRAPHIC  STUDIES: No results found.  ASSESSMENT AND PLAN: This is a very pleasant 70 years old white male with recurrent non-small cell lung cancer, adenocarcinoma with positive EGFR mutation in exon 21 (L858R).  He is currently on treatment with Tarceva 100 mg by mouth daily status post 21 months. He is treating the treatment well with no significant adverse effects. I recommended for the patient to continue his treatment with Tarceva as scheduled. I will send refill of his medication 2 [facility]. For hypothyroidism he continue his treatment with Synthroid by his primary care physician. The patient was advised to call immediately if he has any concerning symptoms in the interval. The patient voices understanding of current disease status and  treatment options and is in agreement with the current care plan.  All questions were answered. The patient knows to call the clinic with any problems, questions or concerns. We can certainly see the patient much sooner if necessary. I spent 10 minutes counseling the patient face to face. The total time spent in the appointment was 15 minutes.  Disclaimer: This note was dictated with voice recognition software. Similar sounding words can inadvertently be transcribed and may not be corrected upon review.

## 2016-03-24 NOTE — Telephone Encounter (Signed)
Patient will pick up a copy of an updated appointment schedule, per 03/24/16 los at next visit on 04/07/16.

## 2016-03-25 MED FILL — TARCEVA 100 MG TABLET: 100 | 30 days supply | Qty: 30 | Fill #0

## 2016-04-06 ENCOUNTER — Encounter: Payer: Self-pay | Admitting: *Deleted

## 2016-04-06 ENCOUNTER — Encounter: Payer: Self-pay | Admitting: Internal Medicine

## 2016-04-06 DIAGNOSIS — C3431 Malignant neoplasm of lower lobe, right bronchus or lung: Secondary | ICD-10-CM

## 2016-04-06 MED ORDER — ERLOTINIB HCL 100 MG PO TABS: 100.0000 mg | ORAL_TABLET | Freq: Every day | ORAL | Status: DC

## 2016-04-07 ENCOUNTER — Other Ambulatory Visit (HOSPITAL_BASED_OUTPATIENT_CLINIC_OR_DEPARTMENT_OTHER): Payer: Medicare Other

## 2016-04-07 ENCOUNTER — Ambulatory Visit: Payer: Medicare Other | Admitting: Internal Medicine

## 2016-04-07 ENCOUNTER — Other Ambulatory Visit: Payer: Medicare Other

## 2016-04-07 ENCOUNTER — Encounter: Payer: Self-pay | Admitting: Internal Medicine

## 2016-04-07 ENCOUNTER — Ambulatory Visit (HOSPITAL_BASED_OUTPATIENT_CLINIC_OR_DEPARTMENT_OTHER): Payer: Medicare Other | Admitting: Internal Medicine

## 2016-04-07 ENCOUNTER — Other Ambulatory Visit (HOSPITAL_COMMUNITY)
Admission: RE | Admit: 2016-04-07 | Discharge: 2016-04-07 | Disposition: A | Discharge status: Home / Self Care | Payer: Medicare Other | Source: Ambulatory Visit | Attending: Internal Medicine | Admitting: Internal Medicine

## 2016-04-07 ENCOUNTER — Telehealth: Payer: Self-pay | Admitting: Internal Medicine

## 2016-04-07 VITALS — BP 116/69 | HR 72 | Temp 97.7°F | Resp 18 | Ht 72.0 in | Wt 243.4 lb

## 2016-04-07 DIAGNOSIS — Z5111 Encounter for antineoplastic chemotherapy: Secondary | ICD-10-CM

## 2016-04-07 DIAGNOSIS — C3431 Malignant neoplasm of lower lobe, right bronchus or lung: Secondary | ICD-10-CM

## 2016-04-07 DIAGNOSIS — Z006 Encounter for examination for normal comparison and control in clinical research program: Secondary | ICD-10-CM

## 2016-04-07 DIAGNOSIS — Z79899 Other long term (current) drug therapy: Secondary | ICD-10-CM

## 2016-04-07 LAB — CBC WITH DIFFERENTIAL/PLATELET
BASO%: 0.5 % (ref 0.0–2.0)
Basophils Absolute: 0 10*3/uL (ref 0.0–0.1)
EOS ABS: 0.2 10*3/uL (ref 0.0–0.5)
EOS%: 3.5 % (ref 0.0–7.0)
HCT: 37.7 % — ABNORMAL LOW (ref 38.4–49.9)
HEMOGLOBIN: 12.7 g/dL — AB (ref 13.0–17.1)
LYMPH%: 39.4 % (ref 14.0–49.0)
MCH: 32.1 pg (ref 27.2–33.4)
MCHC: 33.7 g/dL (ref 32.0–36.0)
MCV: 95.2 fL (ref 79.3–98.0)
MONO#: 0.6 10*3/uL (ref 0.1–0.9)
MONO%: 10.5 % (ref 0.0–14.0)
NEUT%: 46.1 % (ref 39.0–75.0)
NEUTROS ABS: 2.6 10*3/uL (ref 1.5–6.5)
Platelets: 235 10*3/uL (ref 140–400)
RBC: 3.96 10*6/uL — ABNORMAL LOW (ref 4.20–5.82)
RDW: 13 % (ref 11.0–14.6)
WBC: 5.7 10*3/uL (ref 4.0–10.3)
lymph#: 2.3 10*3/uL (ref 0.9–3.3)

## 2016-04-07 LAB — MAGNESIUM: MAGNESIUM: 2.2 mg/dL (ref 1.5–2.5)

## 2016-04-07 LAB — COMPREHENSIVE METABOLIC PANEL
ALT: 23 U/L (ref 0–55)
ANION GAP: 9 meq/L (ref 3–11)
AST: 25 U/L (ref 5–34)
Albumin: 4.2 g/dL (ref 3.5–5.0)
Alkaline Phosphatase: 64 U/L (ref 40–150)
BUN: 15.7 mg/dL (ref 7.0–26.0)
CHLORIDE: 103 meq/L (ref 98–109)
CO2: 26 mEq/L (ref 22–29)
Calcium: 9.7 mg/dL (ref 8.4–10.4)
Creatinine: 1.1 mg/dL (ref 0.7–1.3)
EGFR: 71 mL/min/{1.73_m2} — AB (ref >90)
Glucose: 98 mg/dl (ref 70–140)
POTASSIUM: 3.9 meq/L (ref 3.5–5.1)
Sodium: 139 mEq/L (ref 136–145)
Total Bilirubin: 0.39 mg/dL (ref 0.20–1.20)
Total Protein: 6.7 g/dL (ref 6.4–8.3)

## 2016-04-07 LAB — PHOSPHORUS: PHOSPHORUS: 3.8 mg/dL (ref 2.5–4.6)

## 2016-04-07 LAB — LACTATE DEHYDROGENASE: LDH: 186 U/L (ref 125–245)

## 2016-04-07 LAB — TSH: TSH: 4.008 m[IU]/L (ref 0.320–4.118)

## 2016-04-07 NOTE — Progress Notes (Signed)
04/07/2016 0920 BMS 370 Group D, Arm A Cycle 49 The patient arrived at the clinic with his wife.   Resting O2 sat and VS were obtained, along with weight. The patient reports having had a great holiday.  He has no new complaints. He denies having any new side effects since his last visit.  He continues with intermittent rash.  He continues with SOB only on exertion only.  His toe fungus remains the same. His fatigue remains the same.  He says his GE reflux is intermittent and that the Zantac helps.  He continues to take his erlotinib daily as prescribed and states he has not missed any doses. Dr. [name] was in to perform a targeted physical exam.  Based on lab results review and exam, the patient was cleared to continue treatment with erlotinib. Dr. [name] and the patient were both reminded of the study update: Since the C1D1 visit occurred and erlotinib dosing was initiated on 04Mar2016,  the 24 month date will be [id number] (i.e. latest date the patient can receive treatment in the Treatment Phase of this study)  Also, even though the erlotinib can no longer be given in the Treatment Phase of this study after the maximum of 24 months, if Dr. [name] finds it in the best interest of the patient and wishes the patient to remain on erlotinib, the patient can receive erlotinib (or other desired regimen) while in the Follow-up Phase for the BMS 370 study.  Dr. [name] denied any questions regarding above and the patient verbalized understanding.  The patient has a CT scan per RECIST scheduled on 04/14/2016.  A copy of the RECIST form has been sent to radiology.   The patient had no further questions.  This RN thanked the patient for his time and dedication to research.  This RN wished the patient well. Marcellus Scott, RN, BSN, MHA, OCN   .

## 2016-04-07 NOTE — Progress Notes (Signed)
[place] Telephone:(336) [phone]   Fax:(336) [phone]  OFFICE PROGRESS NOTE  [name],[name], MD 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  PRIOR THERAPY: 1) Status post right upper lobectomy under the care of Dr. Roxan Hockey on 01/23/2006.  2) Tarceva 150 mg by mouth daily as part of the BMS checkmate 370 clinical trial. Status post 6 weeks of treatment.  CURRENT THERAPY: Tarceva 100 mg by mouth daily as part of the BMS Checkmate 370 clinical trial. First dose started 07/17/2014 status post 21 months of treatment.  INTERVAL HISTORY: [name] 71 y.o. male returns to the clinic today for follow-up visit accompanied by his wife. The patient enjoyed his Christmas holiday and new year with his family. He has no complaints today. He denied having any skin rash. He denied having any nausea, vomiting, diarrhea or constipation. He has no significant weight loss or night sweats. He denied having any chest pain, shortness of breath, cough or hemoptysis. He is here today for evaluation with repeat blood work.  MEDICAL HISTORY: Past Medical History:  Diagnosis Date  . Cholelithiases 07/16/2015  . Complication of anesthesia 2007   quit breathing with bronchoscopy, had to spend night  . History of agent Orange exposure 44-45 yrs ago  . History of pneumonia  last 3-4 yrs ago   3 -4 different times  . Hypercholesterolemia   . Hypertension   . Hypothyroidism   . lung ca dx'd 2007   lung right  . Sinus disease    hx of  . Sleep apnea    uses cpap setting opf 12    ALLERGIES:  is allergic to lisinopril.  MEDICATIONS:  Current Outpatient Prescriptions  Medication Sig Dispense Refill  . aspirin 81 MG tablet Take 81 mg by mouth every morning.     . Cholecalciferol 2000 units  CAPS Take 1 capsule by mouth daily.    . clindamycin (CLEOCIN-T) 1 % external solution Apply topically 2 (two) times daily. 240 mL 2  . clindamycin (CLINDAGEL) 1 % gel Apply 1 application topically 2 (two) times daily.    [name] diltiazem (CARDIZEM CD) 360 MG 24 hr capsule Take 360 mg by mouth every morning.     . erlotinib (TARCEVA) 100 MG tablet Take 1 tablet (100 mg total) by mouth daily. Take on an empty stomach 1 hour before meals or 2 hours after.    . levETIRAcetam (KEPPRA) 750 MG tablet     . metoCLOPramide (REGLAN) 10 MG tablet Take 10 mg by mouth every 6 (six) hours as needed for nausea.     . metoprolol succinate (TOPROL-XL) 25 MG 24 hr tablet Take 25 mg by mouth every morning.     . Multiple Vitamins-Minerals (CENTRUM SILVER PO) Take 1 tablet by mouth daily.     . naproxen sodium (ANAPROX) 550 MG tablet Take 550 mg by mouth 3 (three) times daily as needed for moderate pain. Reported on 08/27/2015    . polyethylene glycol (MIRALAX / GLYCOLAX) packet Take 17 g by mouth daily. Reported on 08/27/2015    . psyllium (METAMUCIL) 58.6 % powder Take 1 packet by mouth 3 (three) times daily. Reported on 08/27/2015    . ranitidine (ZANTAC) 75 MG tablet Take 75 mg by mouth 2 (two) times daily.    [name]  rosuvastatin (CRESTOR) 5 MG tablet Take 5 mg by mouth every morning.     [name] SYNTHROID 112 MCG tablet Take 112 mcg by mouth daily.    . valsartan-hydrochlorothiazide (DIOVAN-HCT) 320-25 MG per tablet Take 1 tablet by mouth every morning.     [name] amitriptyline (ELAVIL) 50 MG tablet Take 50 mg by mouth at bedtime. Reported on 06/18/2015    . nitroGLYCERIN (NITROSTAT) 0.4 MG SL tablet Place 0.4 mg under the tongue every 5 (five) minutes as needed for chest pain. Reported on 10/22/2015     No current facility-administered medications for this visit.     SURGICAL HISTORY:  Past Surgical History:  Procedure Laterality Date  . COLONOSCOPY WITH PROPOFOL N/A 03/19/2013   Procedure: COLONOSCOPY WITH PROPOFOL;  Surgeon:  Garlan Fair, MD;  Location: [facility] ENDOSCOPY;  Service: Endoscopy;  Laterality: N/A;  . Cystourethroscopy, [facility].  04/16/2010  . NASAL SINUS SURGERY  1987  . Right video-assisted thoracoscopy, right upper lobectomy and  01/23/2006  . The Olympus video bronchoscope was introduced via the right  12/21/2005  . TONSILLECTOMY  1957  . Torn medial and lateral menisci, left knee.  11/06/2006    REVIEW OF SYSTEMS:  A comprehensive review of systems was negative.   PHYSICAL EXAMINATION: General appearance: alert, cooperative and no distress Head: Normocephalic, without obvious abnormality, atraumatic Neck: no adenopathy, no JVD, supple, symmetrical, trachea midline and thyroid not enlarged, symmetric, no tenderness/mass/nodules Lymph nodes: Cervical, supraclavicular, and axillary nodes normal. Resp: clear to auscultation bilaterally Back: symmetric, no curvature. ROM normal. No CVA tenderness. Cardio: regular rate and rhythm, S1, S2 normal, no murmur, click, rub or gallop GI: soft, non-tender; bowel sounds normal; no masses,  no organomegaly Extremities: extremities normal, atraumatic, no cyanosis or edema  ECOG PERFORMANCE STATUS: 0 - Asymptomatic  Blood pressure 116/69, pulse 72, temperature 97.7 F (36.5 C), temperature source Oral, resp. rate 18, height 6' (1.829 m), weight 243 lb 6.4 oz (110.4 kg), SpO2 99 %.  LABORATORY DATA: Lab Results  Component Value Date   WBC 5.7 04/07/2016   HGB 12.7 (L) 04/07/2016   HCT 37.7 (L) 04/07/2016   MCV 95.2 04/07/2016   PLT 235 04/07/2016      Chemistry      Component Value Date/Time   NA 139 03/24/2016 1050   K 3.7 03/24/2016 1050   CL 107 07/17/2014 1000   CL 102 04/24/2012 0853   CO2 28 03/24/2016 1050   BUN 14.8 03/24/2016 1050   CREATININE 1.0 03/24/2016 1050      Component Value Date/Time   CALCIUM 9.5 03/24/2016 1050   ALKPHOS 61 03/24/2016 1050   AST 25 03/24/2016 1050   ALT 24 03/24/2016 1050   BILITOT 0.50 03/24/2016  1050       RADIOGRAPHIC STUDIES: No results found.  ASSESSMENT AND PLAN:  This is a very pleasant 71 years old white male with recurrent non-small cell lung cancer, adenocarcinoma with positive EGFR mutation in exon 21 (L858R). He is currently on a clinical trial and randomized to the treatment with Tarceva 100 mg by mouth daily status post 22 months of treatment. The patient is tolerating the treatment well with no significant adverse effects. I will see him back for follow-up visit in 2 weeks for evaluation after repeating CT scan of the chest, abdomen and pelvis for restaging of his disease. He was advised to call immediately if he has any concerning symptoms in the interval. The patient voices understanding of current disease  status and treatment options and is in agreement with the current care plan.  All questions were answered. The patient knows to call the clinic with any problems, questions or concerns. We can certainly see the patient much sooner if necessary. I spent 10 minutes counseling the patient face to face. The total time spent in the appointment was 15 minutes.  Disclaimer: This note was dictated with voice recognition software. Similar sounding words can inadvertently be transcribed and may not be corrected upon review.

## 2016-04-07 NOTE — Telephone Encounter (Signed)
Patient given two bottles of contrast for 1/11 CT scan.

## 2016-04-14 ENCOUNTER — Encounter: Payer: Self-pay | Admitting: *Deleted

## 2016-04-14 ENCOUNTER — Ambulatory Visit (HOSPITAL_COMMUNITY)
Admission: RE | Admit: 2016-04-14 | Discharge: 2016-04-14 | Disposition: A | Discharge status: Home / Self Care | Payer: Medicare Other | Source: Ambulatory Visit | Attending: Internal Medicine | Admitting: Internal Medicine

## 2016-04-14 DIAGNOSIS — C3431 Malignant neoplasm of lower lobe, right bronchus or lung: Secondary | ICD-10-CM | POA: Insufficient documentation

## 2016-04-14 DIAGNOSIS — D491 Neoplasm of unspecified behavior of respiratory system: Secondary | ICD-10-CM | POA: Diagnosis not present

## 2016-04-14 DIAGNOSIS — R918 Other nonspecific abnormal finding of lung field: Secondary | ICD-10-CM | POA: Diagnosis not present

## 2016-04-14 DIAGNOSIS — K8020 Calculus of gallbladder without cholecystitis without obstruction: Secondary | ICD-10-CM | POA: Diagnosis not present

## 2016-04-14 DIAGNOSIS — Z5111 Encounter for antineoplastic chemotherapy: Secondary | ICD-10-CM

## 2016-04-14 MED ORDER — IOPAMIDOL (ISOVUE-300) INJECTION 61%
100.0000 mL | Freq: Once | INTRAVENOUS | Status: DC | PRN
  Administered 2016-04-14: 100 mL via INTRAVENOUS
  Filled 2016-04-14: qty 100

## 2016-04-14 MED ORDER — IOPAMIDOL (ISOVUE-300) INJECTION 61%
INTRAVENOUS | Status: AC
  Filled 2016-04-14: qty 100

## 2016-04-19 DIAGNOSIS — H52203 Unspecified astigmatism, bilateral: Secondary | ICD-10-CM | POA: Diagnosis not present

## 2016-04-19 DIAGNOSIS — H2513 Age-related nuclear cataract, bilateral: Secondary | ICD-10-CM | POA: Diagnosis not present

## 2016-04-20 ENCOUNTER — Telehealth: Payer: Self-pay

## 2016-04-20 NOTE — Telephone Encounter (Signed)
Called and left a message verifying his 04/21/16 appt and to call if he needs to change due to weather.

## 2016-04-21 ENCOUNTER — Ambulatory Visit: Payer: Medicare Other | Admitting: Internal Medicine

## 2016-04-21 ENCOUNTER — Other Ambulatory Visit (HOSPITAL_BASED_OUTPATIENT_CLINIC_OR_DEPARTMENT_OTHER): Payer: Medicare Other

## 2016-04-21 ENCOUNTER — Ambulatory Visit (HOSPITAL_BASED_OUTPATIENT_CLINIC_OR_DEPARTMENT_OTHER): Payer: Medicare Other | Admitting: Internal Medicine

## 2016-04-21 ENCOUNTER — Encounter: Payer: Self-pay | Admitting: Internal Medicine

## 2016-04-21 ENCOUNTER — Other Ambulatory Visit (HOSPITAL_COMMUNITY)
Admission: RE | Admit: 2016-04-21 | Discharge: 2016-04-21 | Disposition: A | Discharge status: Home / Self Care | Payer: Medicare Other | Source: Ambulatory Visit | Attending: Internal Medicine | Admitting: Internal Medicine

## 2016-04-21 ENCOUNTER — Encounter: Payer: Self-pay | Admitting: *Deleted

## 2016-04-21 ENCOUNTER — Other Ambulatory Visit: Payer: Medicare Other

## 2016-04-21 VITALS — BP 115/69 | HR 99 | Temp 98.0°F | Resp 18 | Ht 72.0 in | Wt 240.1 lb

## 2016-04-21 DIAGNOSIS — Z5111 Encounter for antineoplastic chemotherapy: Secondary | ICD-10-CM

## 2016-04-21 DIAGNOSIS — C3431 Malignant neoplasm of lower lobe, right bronchus or lung: Secondary | ICD-10-CM

## 2016-04-21 DIAGNOSIS — Z006 Encounter for examination for normal comparison and control in clinical research program: Secondary | ICD-10-CM

## 2016-04-21 DIAGNOSIS — E039 Hypothyroidism, unspecified: Secondary | ICD-10-CM

## 2016-04-21 DIAGNOSIS — I10 Essential (primary) hypertension: Secondary | ICD-10-CM

## 2016-04-21 LAB — COMPREHENSIVE METABOLIC PANEL
ALT: 27 U/L (ref 0–55)
ANION GAP: 8 meq/L (ref 3–11)
AST: 27 U/L (ref 5–34)
Albumin: 3.9 g/dL (ref 3.5–5.0)
Alkaline Phosphatase: 63 U/L (ref 40–150)
BUN: 16.9 mg/dL (ref 7.0–26.0)
CALCIUM: 9.4 mg/dL (ref 8.4–10.4)
CHLORIDE: 104 meq/L (ref 98–109)
CO2: 28 meq/L (ref 22–29)
CREATININE: 1.1 mg/dL (ref 0.7–1.3)
EGFR: 67 mL/min/{1.73_m2} — ABNORMAL LOW (ref >90)
Glucose: 99 mg/dl (ref 70–140)
POTASSIUM: 3.9 meq/L (ref 3.5–5.1)
Sodium: 140 mEq/L (ref 136–145)
Total Bilirubin: 0.4 mg/dL (ref 0.20–1.20)
Total Protein: 6.6 g/dL (ref 6.4–8.3)

## 2016-04-21 LAB — MAGNESIUM: MAGNESIUM: 2.3 mg/dL (ref 1.5–2.5)

## 2016-04-21 LAB — CBC WITH DIFFERENTIAL/PLATELET
BASO%: 0.6 % (ref 0.0–2.0)
BASOS ABS: 0 10*3/uL (ref 0.0–0.1)
EOS%: 3.4 % (ref 0.0–7.0)
Eosinophils Absolute: 0.2 10*3/uL (ref 0.0–0.5)
HCT: 38.2 % — ABNORMAL LOW (ref 38.4–49.9)
HGB: 12.9 g/dL — ABNORMAL LOW (ref 13.0–17.1)
LYMPH%: 39.6 % (ref 14.0–49.0)
MCH: 32.6 pg (ref 27.2–33.4)
MCHC: 33.6 g/dL (ref 32.0–36.0)
MCV: 97.1 fL (ref 79.3–98.0)
MONO#: 0.6 10*3/uL (ref 0.1–0.9)
MONO%: 10.2 % (ref 0.0–14.0)
NEUT#: 2.6 10*3/uL (ref 1.5–6.5)
NEUT%: 46.2 % (ref 39.0–75.0)
PLATELETS: 248 10*3/uL (ref 140–400)
RBC: 3.94 10*6/uL — AB (ref 4.20–5.82)
RDW: 14 % (ref 11.0–14.6)
WBC: 5.6 10*3/uL (ref 4.0–10.3)
lymph#: 2.2 10*3/uL (ref 0.9–3.3)

## 2016-04-21 LAB — LACTATE DEHYDROGENASE: LDH: 173 U/L (ref 125–245)

## 2016-04-21 LAB — PHOSPHORUS: PHOSPHORUS: 3 mg/dL (ref 2.5–4.6)

## 2016-04-21 MED ORDER — ERLOTINIB HCL 100 MG PO TABS: 100.0000 mg | ORAL_TABLET | Freq: Every day | ORAL | Status: DC

## 2016-04-21 NOTE — Progress Notes (Signed)
04/21/2016 Patient in to clinic this morning for assessments prior to beginning treatment cycle 50. Patient was seen by the investigator in the absence of the research nurse today. Based on lab results review and history and physical by Dr. [name], patient condition is acceptable for continued treatment. Spoke with patient by phone this afternoon to review lab and CT results as discussed with him by Dr. [name] today. He had no further questions. Noted that next cycle's visit on 05/05/2016 would be study-related only, and that no charges would be filed to insurance for that visit. Patient confirms that he has taken erlotinib daily and that he has not missed any doses in the past two weeks. Adverse events were reviewed with the patient and are documented in the table below. He denies any new complaints at this time. Discussed visit transition following cycle 52 on 05/19/2016. Noted that patient is scheduled for Cycle 53 on 06/02/16, however, this is subject to change due to end of treatment (Last dose of study treatment) on 06/05/2016. Described EOT assessments to patient, also noting that the transition from 8-week scans to 12-week scans would occur around this time as well. Plans made to discuss further with study monitor at monitoring visit prior to 05/05/16 appointment. Patient requested that 06/02/16 appointments be scheduled at this time, with the knowledge that the appointments may be moved to the following week if necessary. Cindy S. Brigitte Pulse BSN, RN, [place] 04/21/2016 1:38 PM  Adverse Event Log Study/Protocol: BMS [id number] Cycle: 49 04/07/2016 - 04/20/2016 Event Grade Onset Date Resolved Date Attribution to erlotinib Comments  Rash - head, chest, neck, back (intermittent) 1 01/01/15 ongoing Related   Fatigue 1 01/28/15 ongoing Not related   Dyspnea on exertion 1 09/11/14 ongoing Not related intermittent  Nail infection - right first toe 1 11/17/14 ongoing Not related Per patient unchanged, continues to use medication   Anemia 1 02/24/16 ongoing Not related   GE reflux, intermittent 2 03/07/16 ongoing Not related Symptoms improved on Zantac.  Other hematology lab abnormalities not felt to be clinically significant. Cindy S. Brigitte Pulse BSN, RN, CCRP 05/01/2016 1:18 PM

## 2016-04-21 NOTE — Progress Notes (Signed)
[place] [phone]   [phone]) [phone]  OFFICE PROGRESS NOTE  [name],[name], MD 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  PRIOR THERAPY: 1) Status post right upper lobectomy under the care of Dr. Roxan Hockey on 01/23/2006.  2) Tarceva 150 mg by mouth daily as part of the BMS checkmate 370 clinical trial. Status post 6 weeks of treatment.  CURRENT THERAPY: Tarceva 100 mg by mouth daily as part of the BMS Checkmate 370 clinical trial. First dose started 07/17/2014 status post 22 months of treatment.  INTERVAL HISTORY: [name] 71 y.o. male came to clinic today accompanied by his wife for follow-up visit. The patient is feeling fine today with no specific complaints. He is currently on treatment with oral Tarceva 100 mg by mouth daily for recurrent non-small cell lung cancer, adenocarcinoma with positive EGFR mutation in exon 21. He is tolerating the treatment well with no significant adverse effects. He specifically denied having any skin rash or diarrhea. He has no weight loss or night sweats. The patient has no fever or chills. He denied having any nausea, vomiting, diarrhea or constipation. He has no chest pain, shortness of breath, cough or hemoptysis. He had repeat CT scan of the chest, abdomen and pelvis performed recently and he is here for evaluation and discussion of his scan results and treatment options.   MEDICAL HISTORY: Past Medical History:  Diagnosis Date  . Cholelithiases 07/16/2015  . Complication of anesthesia 2007   quit breathing with bronchoscopy, had to spend night  . History of agent Orange exposure 44-45 yrs ago  . History of pneumonia  last 3-4 yrs ago   3 -4 different times  . Hypercholesterolemia   . Hypertension   .  Hypothyroidism   . lung ca dx'd 2007   lung right  . Sinus disease    hx of  . Sleep apnea    uses cpap setting opf 12    ALLERGIES:  is allergic to lisinopril.  MEDICATIONS:  Current Outpatient Prescriptions  Medication Sig Dispense Refill  . amitriptyline (ELAVIL) 50 MG tablet Take 50 mg by mouth at bedtime. Reported on 06/18/2015    . aspirin 81 MG tablet Take 81 mg by mouth every morning.     . Cholecalciferol 2000 units CAPS Take 1 capsule by mouth daily.    . clindamycin (CLEOCIN-T) 1 % external solution Apply topically 2 (two) times daily. 240 mL 2  . clindamycin (CLINDAGEL) 1 % gel Apply 1 application topically 2 (two) times daily.    [name] diltiazem (CARDIZEM CD) 360 MG 24 hr capsule Take 360 mg by mouth every morning.     . erlotinib (TARCEVA) 100 MG tablet Take 1 tablet (100 mg total) by mouth daily. Take on an empty stomach 1 hour before meals or 2 hours after.    . levETIRAcetam (KEPPRA) 750 MG tablet     . metoCLOPramide (REGLAN) 10 MG tablet Take 10 mg by mouth every 6 (six) hours as needed for nausea.     . metoprolol succinate (TOPROL-XL) 25 MG 24 hr tablet Take 25 mg by mouth every morning.     . Multiple Vitamins-Minerals (CENTRUM SILVER PO) Take 1 tablet by mouth daily.     . naproxen sodium (ANAPROX) 550 MG tablet Take 550  mg by mouth 3 (three) times daily as needed for moderate pain. Reported on 08/27/2015    . nitroGLYCERIN (NITROSTAT) 0.4 MG SL tablet Place 0.4 mg under the tongue every 5 (five) minutes as needed for chest pain. Reported on 10/22/2015    . polyethylene glycol (MIRALAX / GLYCOLAX) packet Take 17 g by mouth daily. Reported on 08/27/2015    . psyllium (METAMUCIL) 58.6 % powder Take 1 packet by mouth 3 (three) times daily. Reported on 08/27/2015    . ranitidine (ZANTAC) 75 MG tablet Take 75 mg by mouth 2 (two) times daily.    . rosuvastatin (CRESTOR) 5 MG tablet Take 5 mg by mouth every morning.     [name] SYNTHROID 112 MCG tablet Take 112 mcg by mouth daily.      . valsartan-hydrochlorothiazide (DIOVAN-HCT) 320-25 MG per tablet Take 1 tablet by mouth every morning.      No current facility-administered medications for this visit.     SURGICAL HISTORY:  Past Surgical History:  Procedure Laterality Date  . COLONOSCOPY WITH PROPOFOL N/A 03/19/2013   Procedure: COLONOSCOPY WITH PROPOFOL;  Surgeon: Charolett Bumpers, MD;  Location: [facility];  Service: Endoscopy;  Laterality: N/A;  . Cystourethroscopy, [facility].  04/16/2010  . NASAL SINUS SURGERY  1987  . Right video-assisted thoracoscopy, right upper lobectomy and  01/23/2006  . The Olympus video bronchoscope was introduced via the right  12/21/2005  . TONSILLECTOMY  1957  . Torn medial and lateral menisci, left knee.  11/06/2006    REVIEW OF SYSTEMS:  Constitutional: negative Eyes: negative Ears, nose, mouth, throat, and face: negative Respiratory: negative Cardiovascular: negative Gastrointestinal: negative Genitourinary:negative Integument/breast: negative Hematologic/lymphatic: negative Musculoskeletal:negative Neurological: negative Behavioral/Psych: negative Endocrine: negative Allergic/Immunologic: negative   PHYSICAL EXAMINATION: General appearance: alert, cooperative and no distress Head: Normocephalic, without obvious abnormality, atraumatic Neck: no adenopathy, no JVD, supple, symmetrical, trachea midline and thyroid not enlarged, symmetric, no tenderness/mass/nodules Lymph nodes: Cervical, supraclavicular, and axillary nodes normal. Resp: clear to auscultation bilaterally Back: symmetric, no curvature. ROM normal. No CVA tenderness. Cardio: regular rate and rhythm, S1, S2 normal, no murmur, click, rub or gallop GI: soft, non-tender; bowel sounds normal; no masses,  no organomegaly Extremities: extremities normal, atraumatic, no cyanosis or edema Neurologic: Alert and oriented X 3, normal strength and tone. Normal symmetric reflexes. Normal coordination and gait  ECOG  PERFORMANCE STATUS: 0 - Asymptomatic  Blood pressure 115/69, pulse 99, temperature 98 F (36.7 C), temperature source Oral, resp. rate 18, height 6' (1.829 m), weight 240 lb 1.6 oz (108.9 kg), SpO2 99 %.  LABORATORY DATA: Lab Results  Component Value Date   WBC 5.6 04/21/2016   HGB 12.9 (L) 04/21/2016   HCT 38.2 (L) 04/21/2016   MCV 97.1 04/21/2016   PLT 248 04/21/2016      Chemistry      Component Value Date/Time   NA 139 04/07/2016 0824   K 3.9 04/07/2016 0824   CL 107 07/17/2014 1000   CL 102 04/24/2012 0853   CO2 26 04/07/2016 0824   BUN 15.7 04/07/2016 0824   CREATININE 1.1 04/07/2016 0824      Component Value Date/Time   CALCIUM 9.7 04/07/2016 0824   ALKPHOS 64 04/07/2016 0824   AST 25 04/07/2016 0824   ALT 23 04/07/2016 0824   BILITOT 0.39 04/07/2016 0824       RADIOGRAPHIC STUDIES: Ct Chest W Contrast  Result Date: 04/14/2016 CLINICAL DATA:  Recurrent non-small cell lung cancer, status post right upper  lobe lobectomy. EXAM: CT CHEST, ABDOMEN, AND PELVIS WITH CONTRAST TECHNIQUE: Multidetector CT imaging of the chest, abdomen and pelvis was performed following the standard protocol during bolus administration of intravenous contrast. CONTRAST:  163m ISOVUE-300 IOPAMIDOL (ISOVUE-300) INJECTION 61% COMPARISON:  None. FINDINGS: Recist 1.1 Target lesions: 1. Right lower lobe pulmonary nodule on image number 47 measures 16 x 15 mm and previously measured 18 x 14 mm. 2. Right lower lobe pulmonary nodule on image 51 measures 16 x 6 mm. When remeasured on the prior study on the same image it measured 16 x 6 mm. Non target lesions: 1. Sub solid right anterior lower lobe nodule.  Not identified. 2. Right lower lobe pulmonary nodule on image number 63 measures 6.5 mm. Previous 7 mm. CT CHEST FINDINGS Chest wall: No chest wall masses, supraclavicular or axillary adenopathy. The thyroid gland appears normal. Cardiovascular: The heart is normal in size. No pericardial effusion. The  aorta is normal in caliber. No dissection. The branch vessels are patent. No significant coronary artery calcifications. Mediastinum/Nodes: No mediastinal or hilar mass or lymphadenopathy. Stable surgical changes in the right hilum from a previous right upper lobe lobectomy. Lungs/Pleura: Loss of volume in the right lung and scarring changes related to prior right upper lobe lobectomy. No acute overlying pulmonary findings. 16 x 15 mm triangular density in the right lung on image 47 appears stable when compared to prior exams. No findings for recurrent tumor. 16 x 6 mm irregular linear but somewhat nodular appearing density in the right lung on image 51 is stable. Right lung nodule on image 63 measures 6.5 mm and previously measured 7 mm. New line no new pulmonary lesions. No acute overlying pulmonary process. The left lung remains clear. Musculoskeletal: No significant bony findings. CT ABDOMEN PELVIS FINDINGS Hepatobiliary: No focal hepatic lesions or intrahepatic biliary dilatation. Stable calcified gallstone in the gallbladder. Pancreas: No focal pancreatic lesions or acute inflammatory changes. No ductal dilatation. Spleen: Normal size.  No focal lesions. Adrenals/Urinary Tract: The adrenal glands are normal. Both kidneys are unremarkable and stable. Stomach/Bowel: The stomach, duodenum, small bowel and colon are unremarkable. No inflammatory changes, mass lesions or obstructive findings. The terminal ileum is normal. The appendix is normal. Vascular/Lymphatic: No significant vascular findings are present. No enlarged abdominal or pelvic lymph nodes. Reproductive: The prostate gland and seminal vesicles are unremarkable. Other: No pelvic mass or adenopathy. No free pelvic fluid collections. No inguinal mass or adenopathy. No abdominal wall hernia or subcutaneous lesions. Musculoskeletal: No significant bony findings. No bone lesions to suggest metastatic disease. IMPRESSION: Stable right-sided pulmonary  lesions. No new or progressive findings. No mediastinal or hilar mass or lymphadenopathy. No findings for abdominal/ pelvic metastatic disease or acute findings. Electronically Signed   By: PMarijo SanesM.D.   On: 04/14/2016 12:56   Ct Abdomen Pelvis W Contrast  Result Date: 04/14/2016 CLINICAL DATA:  Recurrent non-small cell lung cancer, status post right upper lobe lobectomy. EXAM: CT CHEST, ABDOMEN, AND PELVIS WITH CONTRAST TECHNIQUE: Multidetector CT imaging of the chest, abdomen and pelvis was performed following the standard protocol during bolus administration of intravenous contrast. CONTRAST:  1072mISOVUE-300 IOPAMIDOL (ISOVUE-300) INJECTION 61% COMPARISON:  None. FINDINGS: Recist 1.1 Target lesions: 1. Right lower lobe pulmonary nodule on image number 47 measures 16 x 15 mm and previously measured 18 x 14 mm. 2. Right lower lobe pulmonary nodule on image 51 measures 16 x 6 mm. When remeasured on the prior study on the same image it measured 16  x 6 mm. Non target lesions: 1. Sub solid right anterior lower lobe nodule.  Not identified. 2. Right lower lobe pulmonary nodule on image number 63 measures 6.5 mm. Previous 7 mm. CT CHEST FINDINGS Chest wall: No chest wall masses, supraclavicular or axillary adenopathy. The thyroid gland appears normal. Cardiovascular: The heart is normal in size. No pericardial effusion. The aorta is normal in caliber. No dissection. The branch vessels are patent. No significant coronary artery calcifications. Mediastinum/Nodes: No mediastinal or hilar mass or lymphadenopathy. Stable surgical changes in the right hilum from a previous right upper lobe lobectomy. Lungs/Pleura: Loss of volume in the right lung and scarring changes related to prior right upper lobe lobectomy. No acute overlying pulmonary findings. 16 x 15 mm triangular density in the right lung on image 47 appears stable when compared to prior exams. No findings for recurrent tumor. 16 x 6 mm irregular linear  but somewhat nodular appearing density in the right lung on image 51 is stable. Right lung nodule on image 63 measures 6.5 mm and previously measured 7 mm. New line no new pulmonary lesions. No acute overlying pulmonary process. The left lung remains clear. Musculoskeletal: No significant bony findings. CT ABDOMEN PELVIS FINDINGS Hepatobiliary: No focal hepatic lesions or intrahepatic biliary dilatation. Stable calcified gallstone in the gallbladder. Pancreas: No focal pancreatic lesions or acute inflammatory changes. No ductal dilatation. Spleen: Normal size.  No focal lesions. Adrenals/Urinary Tract: The adrenal glands are normal. Both kidneys are unremarkable and stable. Stomach/Bowel: The stomach, duodenum, small bowel and colon are unremarkable. No inflammatory changes, mass lesions or obstructive findings. The terminal ileum is normal. The appendix is normal. Vascular/Lymphatic: No significant vascular findings are present. No enlarged abdominal or pelvic lymph nodes. Reproductive: The prostate gland and seminal vesicles are unremarkable. Other: No pelvic mass or adenopathy. No free pelvic fluid collections. No inguinal mass or adenopathy. No abdominal wall hernia or subcutaneous lesions. Musculoskeletal: No significant bony findings. No bone lesions to suggest metastatic disease. IMPRESSION: Stable right-sided pulmonary lesions. No new or progressive findings. No mediastinal or hilar mass or lymphadenopathy. No findings for abdominal/ pelvic metastatic disease or acute findings. Electronically Signed   By: Marijo Sanes M.D.   On: 04/14/2016 12:56    ASSESSMENT AND PLAN:  This is a very pleasant 71 years old white male with recurrent non-small cell lung cancer, adenocarcinoma with positive EGFR mutation in exon 21. He is currently undergoing treatment with Tarceva 100 mg by mouth daily on a clinical trial is status post 22 months. The patient is tolerating his treatment with Tarceva well. She had repeat  CT scan of the chest, abdomen and pelvis performed recently. I personally and independently reviewed the scan images and discuss the results with the patient and his wife. The scan showed no evidence for disease progression. I recommended for the patient to continue his current treatment with Tarceva 100 mg by mouth daily. He would come back for follow-up visit in 2 weeks for evaluation and repeat blood work. For the hypothyroidism, the patient will continue his current treatment with Synthroid 112 g by mouth daily. For hypertension, the patient will continue with Diovan-HCT and diltiazem. The patient was advised to call immediately if he has any concerning symptoms in the interval. The patient voices understanding of current disease status and treatment options and is in agreement with the current care plan.  All questions were answered. The patient knows to call the clinic with any problems, questions or concerns. We can certainly  see the patient much sooner if necessary.  Disclaimer: This note was dictated with voice recognition software. Similar sounding words can inadvertently be transcribed and may not be corrected upon review.

## 2016-04-25 MED FILL — TARCEVA 100 MG TABLET: 100 | 30 days supply | Qty: 30 | Fill #1

## 2016-04-26 ENCOUNTER — Ambulatory Visit (INDEPENDENT_AMBULATORY_CARE_PROVIDER_SITE_OTHER): Payer: Medicare Other | Admitting: Thoracic Surgery (Cardiothoracic Vascular Surgery)

## 2016-04-26 ENCOUNTER — Encounter: Payer: Self-pay | Admitting: Thoracic Surgery (Cardiothoracic Vascular Surgery)

## 2016-04-26 VITALS — BP 126/76 | HR 72 | Resp 20 | Ht 72.0 in | Wt 245.0 lb

## 2016-04-26 DIAGNOSIS — Z85118 Personal history of other malignant neoplasm of bronchus and lung: Secondary | ICD-10-CM | POA: Diagnosis not present

## 2016-04-26 NOTE — Progress Notes (Signed)
[address]       [place] [address]             [phone]    HPI: [name] returns for a scheduled follow up visit  He is a 71 year old man who had a lobectomy for stage IA non-small cell carcinoma back in 2007.  He did well for a long time and then developed some slowly growing groundglass nodules. Biopsy showed non-small cell carcinoma. He was EGFR positive. He was treated with 6 weeks of Tarceva at 150 mg. He now is on a 100 mg maintenance dose.  He recently saw [name].  His primary complaint is that he lacks energy. That has not changed recently. His weight is stable. He denies any unusual headaches or visual changes and does not have any new or unusual bone or joint pain.  I saw him in August 2017. He was complaining of right flank pain at that time.  He said that it sometimes was associated with food, but then would come and go and was not consistent. That pain has improved and he rarely has it any more.   Past Medical History:  Diagnosis Date  . Cholelithiases 07/16/2015  . Complication of anesthesia 2007   quit breathing with bronchoscopy, had to spend night  . History of agent Orange exposure 44-45 yrs ago  . History of pneumonia  last 3-4 yrs ago   3 -4 different times  . Hypercholesterolemia   . Hypertension   . Hypothyroidism   . lung ca dx'd 2007   lung right  . Sinus disease    hx of  . Sleep apnea    uses cpap setting opf 12      Current Outpatient Prescriptions  Medication Sig Dispense Refill  . amitriptyline (ELAVIL) 50 MG tablet Take 50 mg by mouth at bedtime. Reported on 06/18/2015    . aspirin 81 MG tablet Take 81 mg by mouth every morning.     . Cholecalciferol 2000 units CAPS Take 1 capsule by mouth daily.    . clindamycin (CLEOCIN-T) 1 % external solution Apply topically 2 (two) times daily. 240 mL 2  . clindamycin (CLINDAGEL) 1 % gel Apply 1 application topically 2 (two) times daily.    [name] diltiazem (CARDIZEM CD)  360 MG 24 hr capsule Take 360 mg by mouth every morning.     . erlotinib (TARCEVA) 100 MG tablet Take 1 tablet (100 mg total) by mouth daily. Take on an empty stomach 1 hour before meals or 2 hours after.    . levETIRAcetam (KEPPRA) 750 MG tablet     . metoCLOPramide (REGLAN) 10 MG tablet Take 10 mg by mouth every 6 (six) hours as needed for nausea.     . metoprolol succinate (TOPROL-XL) 25 MG 24 hr tablet Take 25 mg by mouth every morning.     . Multiple Vitamins-Minerals (CENTRUM SILVER PO) Take 1 tablet by mouth daily.     . naproxen sodium (ANAPROX) 550 MG tablet Take 550 mg by mouth 3 (three) times daily as needed for moderate pain. Reported on 08/27/2015    . nitroGLYCERIN (NITROSTAT) 0.4 MG SL tablet Place 0.4 mg under the tongue every 5 (five) minutes as needed for chest pain. Reported on 10/22/2015    . polyethylene glycol (MIRALAX / GLYCOLAX) packet Take 17 g by mouth daily. Reported on 08/27/2015    . psyllium (METAMUCIL) 58.6 % powder Take 1 packet by mouth 3 (  three) times daily. Reported on 08/27/2015    . ranitidine (ZANTAC) 75 MG tablet Take 75 mg by mouth 2 (two) times daily.    . rosuvastatin (CRESTOR) 5 MG tablet Take 5 mg by mouth every morning.     [name] SYNTHROID 112 MCG tablet Take 112 mcg by mouth daily.    . valsartan-hydrochlorothiazide (DIOVAN-HCT) 320-25 MG per tablet Take 1 tablet by mouth every morning.      No current facility-administered medications for this visit.     Physical Exam BP 126/76   Pulse 72   Resp 20   Ht 6' (1.829 m)   Wt 245 lb (111.1 kg)   SpO2 97% Comment: RA  BMI 33.30 kg/m  72 year old man in no acute distress Alert and oriented 3 with no focal deficits No cervical or supraclavicular adenopathy Lungs diminished right base, otherwise clear Cardiac regular rate and rhythm normal S1 and S2  Diagnostic Tests: CT CHEST, ABDOMEN, AND PELVIS WITH CONTRAST  TECHNIQUE: Multidetector CT imaging of the chest, abdomen and pelvis was performed  following the standard protocol during bolus administration of intravenous contrast.  CONTRAST:  150m ISOVUE-300 IOPAMIDOL (ISOVUE-300) INJECTION 61%  COMPARISON:  None.  FINDINGS: Recist 1.1  Target lesions:  1. Right lower lobe pulmonary nodule on image number 47 measures 16 x 15 mm and previously measured 18 x 14 mm. 2. Right lower lobe pulmonary nodule on image 51 measures 16 x 6 mm. When remeasured on the prior study on the same image it measured 16 x 6 mm.  Non target lesions:  1. Sub solid right anterior lower lobe nodule.  Not identified. 2. Right lower lobe pulmonary nodule on image number 63 measures 6.5 mm. Previous 7 mm.  CT CHEST FINDINGS  Chest wall: No chest wall masses, supraclavicular or axillary adenopathy. The thyroid gland appears normal.  Cardiovascular: The heart is normal in size. No pericardial effusion. The aorta is normal in caliber. No dissection. The branch vessels are patent. No significant coronary artery calcifications.  Mediastinum/Nodes: No mediastinal or hilar mass or lymphadenopathy. Stable surgical changes in the right hilum from a previous right upper lobe lobectomy.  Lungs/Pleura: Loss of volume in the right lung and scarring changes related to prior right upper lobe lobectomy. No acute overlying pulmonary findings.  16 x 15 mm triangular density in the right lung on image 47 appears stable when compared to prior exams. No findings for recurrent tumor.  16 x 6 mm irregular linear but somewhat nodular appearing density in the right lung on image 51 is stable.  Right lung nodule on image 63 measures 6.5 mm and previously measured 7 mm. New line no new pulmonary lesions. No acute overlying pulmonary process. The left lung remains clear.  Musculoskeletal: No significant bony findings.  CT ABDOMEN PELVIS FINDINGS  Hepatobiliary: No focal hepatic lesions or intrahepatic biliary dilatation. Stable calcified  gallstone in the gallbladder.  Pancreas: No focal pancreatic lesions or acute inflammatory changes. No ductal dilatation.  Spleen: Normal size.  No focal lesions.  Adrenals/Urinary Tract: The adrenal glands are normal.  Both kidneys are unremarkable and stable.  Stomach/Bowel: The stomach, duodenum, small bowel and colon are unremarkable. No inflammatory changes, mass lesions or obstructive findings. The terminal ileum is normal. The appendix is normal.  Vascular/Lymphatic: No significant vascular findings are present. No enlarged abdominal or pelvic lymph nodes.  Reproductive: The prostate gland and seminal vesicles are unremarkable.  Other: No pelvic mass or adenopathy. No free pelvic fluid collections.  No inguinal mass or adenopathy. No abdominal wall hernia or subcutaneous lesions.  Musculoskeletal: No significant bony findings. No bone lesions to suggest metastatic disease.  IMPRESSION: Stable right-sided pulmonary lesions. No new or progressive findings.  No mediastinal or hilar mass or lymphadenopathy.  No findings for abdominal/ pelvic metastatic disease or acute findings.   Electronically Signed   By: Marijo Sanes M.D.   On: 04/14/2016 12:56 I personally reviewed the CT chest images and concur with the findings noted above.  Impression: [name] is a 71 year old gentleman who had a right upper lobectomy for stage IA adenocarcinoma in 2007. He did not require adjuvant treatment. He did well for many years until he developed some groundglass nodules in the right lung. These slowly growing over time. Needle biopsy showed adenocarcinoma. He has been treated with Tarceva in the setting of a clinical trial. He is tolerating Tarceva well with minimal side effects.  CT shows lesions are stable.  Right flank and upper quadrant pain- with a major complaint for him back in August. He says is rarely having any of that pain now and when it does occur it is  less severe.  Plan: Continue to follow-up with [name].  I'll see [name] back in 6 months to check on his progress.  Melrose Nakayama, MD [facility] [phone]

## 2016-04-29 ENCOUNTER — Other Ambulatory Visit: Payer: Self-pay | Admitting: *Deleted

## 2016-04-29 DIAGNOSIS — C3431 Malignant neoplasm of lower lobe, right bronchus or lung: Secondary | ICD-10-CM

## 2016-04-29 DIAGNOSIS — Z79899 Other long term (current) drug therapy: Secondary | ICD-10-CM

## 2016-05-03 ENCOUNTER — Encounter: Payer: Medicare Other | Admitting: Thoracic Surgery (Cardiothoracic Vascular Surgery)

## 2016-05-05 ENCOUNTER — Other Ambulatory Visit (HOSPITAL_BASED_OUTPATIENT_CLINIC_OR_DEPARTMENT_OTHER): Payer: Medicare Other

## 2016-05-05 ENCOUNTER — Encounter: Payer: Self-pay | Admitting: Internal Medicine

## 2016-05-05 ENCOUNTER — Ambulatory Visit (HOSPITAL_BASED_OUTPATIENT_CLINIC_OR_DEPARTMENT_OTHER): Payer: Medicare Other | Admitting: Internal Medicine

## 2016-05-05 ENCOUNTER — Other Ambulatory Visit (HOSPITAL_COMMUNITY)
Admission: RE | Admit: 2016-05-05 | Discharge: 2016-05-05 | Disposition: A | Discharge status: Home / Self Care | Payer: Medicare Other | Source: Ambulatory Visit | Attending: Internal Medicine | Admitting: Internal Medicine

## 2016-05-05 ENCOUNTER — Encounter: Payer: Medicare Other | Admitting: *Deleted

## 2016-05-05 VITALS — BP 130/82 | HR 63 | Temp 97.8°F | Resp 17 | Ht 72.0 in | Wt 242.3 lb

## 2016-05-05 DIAGNOSIS — C3431 Malignant neoplasm of lower lobe, right bronchus or lung: Secondary | ICD-10-CM

## 2016-05-05 DIAGNOSIS — Z006 Encounter for examination for normal comparison and control in clinical research program: Secondary | ICD-10-CM

## 2016-05-05 DIAGNOSIS — Z5111 Encounter for antineoplastic chemotherapy: Secondary | ICD-10-CM

## 2016-05-05 LAB — CBC WITH DIFFERENTIAL/PLATELET
BASO%: 0.4 % (ref 0.0–2.0)
BASOS ABS: 0 10*3/uL (ref 0.0–0.1)
EOS%: 4.7 % (ref 0.0–7.0)
Eosinophils Absolute: 0.2 10*3/uL (ref 0.0–0.5)
HCT: 35.6 % — ABNORMAL LOW (ref 38.4–49.9)
HEMOGLOBIN: 12.6 g/dL — AB (ref 13.0–17.1)
LYMPH#: 1.5 10*3/uL (ref 0.9–3.3)
LYMPH%: 32.6 % (ref 14.0–49.0)
MCH: 32.2 pg (ref 27.2–33.4)
MCHC: 35.4 g/dL (ref 32.0–36.0)
MCV: 91 fL (ref 79.3–98.0)
MONO#: 0.6 10*3/uL (ref 0.1–0.9)
MONO%: 13.4 % (ref 0.0–14.0)
NEUT#: 2.3 10*3/uL (ref 1.5–6.5)
NEUT%: 48.9 % (ref 39.0–75.0)
PLATELETS: 214 10*3/uL (ref 140–400)
RBC: 3.91 10*6/uL — ABNORMAL LOW (ref 4.20–5.82)
RDW: 13.5 % (ref 11.0–14.6)
WBC: 4.7 10*3/uL (ref 4.0–10.3)

## 2016-05-05 LAB — LACTATE DEHYDROGENASE: LDH: 170 U/L (ref 125–245)

## 2016-05-05 LAB — COMPREHENSIVE METABOLIC PANEL
ALBUMIN: 4 g/dL (ref 3.5–5.0)
ALK PHOS: 59 U/L (ref 40–150)
ALT: 26 U/L (ref 0–55)
ANION GAP: 8 meq/L (ref 3–11)
AST: 27 U/L (ref 5–34)
BILIRUBIN TOTAL: 0.4 mg/dL (ref 0.20–1.20)
BUN: 16.5 mg/dL (ref 7.0–26.0)
CALCIUM: 10 mg/dL (ref 8.4–10.4)
CHLORIDE: 105 meq/L (ref 98–109)
CO2: 28 mEq/L (ref 22–29)
Creatinine: 1.1 mg/dL (ref 0.7–1.3)
EGFR: 70 mL/min/{1.73_m2} — ABNORMAL LOW (ref >90)
Glucose: 99 mg/dl (ref 70–140)
Potassium: 4.2 mEq/L (ref 3.5–5.1)
Sodium: 141 mEq/L (ref 136–145)
TOTAL PROTEIN: 6.7 g/dL (ref 6.4–8.3)

## 2016-05-05 LAB — PHOSPHORUS: Phosphorus: 3.1 mg/dL (ref 2.5–4.6)

## 2016-05-05 LAB — MAGNESIUM: MAGNESIUM: 2.4 mg/dL (ref 1.5–2.5)

## 2016-05-05 MED ORDER — ERLOTINIB HCL 100 MG PO TABS
100.0000 mg | ORAL_TABLET | Freq: Every day | ORAL | Status: DC
Start: 2016-05-05 — End: 2016-05-18

## 2016-05-05 NOTE — Progress Notes (Signed)
[facility] [phone]   [phone]  OFFICE PROGRESS NOTE  [name],[name], MD 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  PRIOR THERAPY: 1) Status post right upper lobectomy under the care of Dr. Roxan Hockey on 01/23/2006.  2) Tarceva 150 mg by mouth daily as part of the BMS checkmate 370 clinical trial. Status post 6 weeks of treatment.  CURRENT THERAPY: Tarceva 100 mg by mouth daily as part of the BMS Checkmate 370 clinical trial. First dose started 07/17/2014 status post 23 months of treatment.  INTERVAL HISTORY: [name] 71 y.o. male came to the clinic today for follow-up visit accompanied by his wife. The patient is currently on treatment with Tarceva 100 mg by mouth daily. He is tolerating the treatment well with no significant adverse effects. He denied having any skin rash or diarrhea. He denied having any chest pain, shortness of breath, cough or hemoptysis. He has no fever or chills. He has no nausea, vomiting or constipation. He denied having any significant weight loss or night sweats. He is here today for evaluation and repeat blood work.  MEDICAL HISTORY: Past Medical History:  Diagnosis Date  . Cholelithiases 07/16/2015  . Complication of anesthesia 2007   quit breathing with bronchoscopy, had to spend night  . History of agent Orange exposure 44-45 yrs ago  . History of pneumonia  last 3-4 yrs ago   3 -4 different times  . Hypercholesterolemia   . Hypertension   . Hypothyroidism   . lung ca dx'd 2007   lung right  . Sinus disease    hx of  . Sleep apnea    uses cpap setting opf 12    ALLERGIES:  is allergic to lisinopril.  MEDICATIONS:  Current Outpatient Prescriptions  Medication Sig Dispense Refill  . amitriptyline  (ELAVIL) 50 MG tablet Take 50 mg by mouth at bedtime. Reported on 06/18/2015    . aspirin 81 MG tablet Take 81 mg by mouth every morning.     . Cholecalciferol 2000 units CAPS Take 1 capsule by mouth daily.    . clindamycin (CLEOCIN-T) 1 % external solution Apply topically 2 (two) times daily. 240 mL 2  . clindamycin (CLINDAGEL) 1 % gel Apply 1 application topically 2 (two) times daily.    [name] diltiazem (CARDIZEM CD) 360 MG 24 hr capsule Take 360 mg by mouth every morning.     . erlotinib (TARCEVA) 100 MG tablet Take 1 tablet (100 mg total) by mouth daily. Take on an empty stomach 1 hour before meals or 2 hours after.    . levETIRAcetam (KEPPRA) 750 MG tablet     . metoCLOPramide (REGLAN) 10 MG tablet Take 10 mg by mouth every 6 (six) hours as needed for nausea.     . metoprolol succinate (TOPROL-XL) 25 MG 24 hr tablet Take 25 mg by mouth every morning.     . Multiple Vitamins-Minerals (CENTRUM SILVER PO) Take 1 tablet by mouth daily.     . naproxen sodium (ANAPROX) 550 MG tablet Take 550 mg by mouth 3 (three) times daily as needed for moderate pain. Reported on 08/27/2015    . nitroGLYCERIN (NITROSTAT) 0.4 MG SL tablet Place 0.4 mg under the tongue every 5 (five) minutes as needed for chest pain. Reported on 10/22/2015    .  polyethylene glycol (MIRALAX / GLYCOLAX) packet Take 17 g by mouth daily. Reported on 08/27/2015    . psyllium (METAMUCIL) 58.6 % powder Take 1 packet by mouth 3 (three) times daily. Reported on 08/27/2015    . ranitidine (ZANTAC) 75 MG tablet Take 75 mg by mouth 2 (two) times daily.    . rosuvastatin (CRESTOR) 5 MG tablet Take 5 mg by mouth every morning.     [name] SYNTHROID 112 MCG tablet Take 112 mcg by mouth daily.    . valsartan-hydrochlorothiazide (DIOVAN-HCT) 320-25 MG per tablet Take 1 tablet by mouth every morning.      No current facility-administered medications for this visit.     SURGICAL HISTORY:  Past Surgical History:  Procedure Laterality Date  . COLONOSCOPY WITH  PROPOFOL N/A 03/19/2013   Procedure: COLONOSCOPY WITH PROPOFOL;  Surgeon: Garlan Fair, MD;  Location: [facility];  Service: Endoscopy;  Laterality: N/A;  . Cystourethroscopy, [facility].  04/16/2010  . NASAL SINUS SURGERY  1987  . Right video-assisted thoracoscopy, right upper lobectomy and  01/23/2006  . The Olympus video bronchoscope was introduced via the right  12/21/2005  . TONSILLECTOMY  1957  . Torn medial and lateral menisci, left knee.  11/06/2006    REVIEW OF SYSTEMS:  A comprehensive review of systems was negative.   PHYSICAL EXAMINATION: General appearance: alert, cooperative and no distress Head: Normocephalic, without obvious abnormality, atraumatic Neck: no adenopathy, no JVD, supple, symmetrical, trachea midline and thyroid not enlarged, symmetric, no tenderness/mass/nodules Lymph nodes: Cervical, supraclavicular, and axillary nodes normal. Resp: clear to auscultation bilaterally Back: symmetric, no curvature. ROM normal. No CVA tenderness. Cardio: regular rate and rhythm, S1, S2 normal, no murmur, click, rub or gallop GI: soft, non-tender; bowel sounds normal; no masses,  no organomegaly Extremities: extremities normal, atraumatic, no cyanosis or edema  ECOG PERFORMANCE STATUS: 0 - Asymptomatic  Blood pressure 130/82, pulse 63, temperature 97.8 F (36.6 C), temperature source Oral, resp. rate 17, height 6' (1.829 m), weight 242 lb 4.8 oz (109.9 kg), SpO2 99 %.  LABORATORY DATA: Lab Results  Component Value Date   WBC 4.7 05/05/2016   HGB 12.6 (L) 05/05/2016   HCT 35.6 (L) 05/05/2016   MCV 91.0 05/05/2016   PLT 214 05/05/2016      Chemistry      Component Value Date/Time   NA 141 05/05/2016 0843   K 4.2 05/05/2016 0843   CL 107 07/17/2014 1000   CL 102 04/24/2012 0853   CO2 28 05/05/2016 0843   BUN 16.5 05/05/2016 0843   CREATININE 1.1 05/05/2016 0843      Component Value Date/Time   CALCIUM 10.0 05/05/2016 0843   ALKPHOS 59 05/05/2016 0843    AST 27 05/05/2016 0843   ALT 26 05/05/2016 0843   BILITOT 0.40 05/05/2016 0843       RADIOGRAPHIC STUDIES: Ct Chest W Contrast  Result Date: 04/14/2016 CLINICAL DATA:  Recurrent non-small cell lung cancer, status post right upper lobe lobectomy. EXAM: CT CHEST, ABDOMEN, AND PELVIS WITH CONTRAST TECHNIQUE: Multidetector CT imaging of the chest, abdomen and pelvis was performed following the standard protocol during bolus administration of intravenous contrast. CONTRAST:  1102m ISOVUE-300 IOPAMIDOL (ISOVUE-300) INJECTION 61% COMPARISON:  None. FINDINGS: Recist 1.1 Target lesions: 1. Right lower lobe pulmonary nodule on image number 47 measures 16 x 15 mm and previously measured 18 x 14 mm. 2. Right lower lobe pulmonary nodule on image 51 measures 16 x 6 mm. When remeasured on the prior  study on the same image it measured 16 x 6 mm. Non target lesions: 1. Sub solid right anterior lower lobe nodule.  Not identified. 2. Right lower lobe pulmonary nodule on image number 63 measures 6.5 mm. Previous 7 mm. CT CHEST FINDINGS Chest wall: No chest wall masses, supraclavicular or axillary adenopathy. The thyroid gland appears normal. Cardiovascular: The heart is normal in size. No pericardial effusion. The aorta is normal in caliber. No dissection. The branch vessels are patent. No significant coronary artery calcifications. Mediastinum/Nodes: No mediastinal or hilar mass or lymphadenopathy. Stable surgical changes in the right hilum from a previous right upper lobe lobectomy. Lungs/Pleura: Loss of volume in the right lung and scarring changes related to prior right upper lobe lobectomy. No acute overlying pulmonary findings. 16 x 15 mm triangular density in the right lung on image 47 appears stable when compared to prior exams. No findings for recurrent tumor. 16 x 6 mm irregular linear but somewhat nodular appearing density in the right lung on image 51 is stable. Right lung nodule on image 63 measures 6.5 mm and  previously measured 7 mm. New line no new pulmonary lesions. No acute overlying pulmonary process. The left lung remains clear. Musculoskeletal: No significant bony findings. CT ABDOMEN PELVIS FINDINGS Hepatobiliary: No focal hepatic lesions or intrahepatic biliary dilatation. Stable calcified gallstone in the gallbladder. Pancreas: No focal pancreatic lesions or acute inflammatory changes. No ductal dilatation. Spleen: Normal size.  No focal lesions. Adrenals/Urinary Tract: The adrenal glands are normal. Both kidneys are unremarkable and stable. Stomach/Bowel: The stomach, duodenum, small bowel and colon are unremarkable. No inflammatory changes, mass lesions or obstructive findings. The terminal ileum is normal. The appendix is normal. Vascular/Lymphatic: No significant vascular findings are present. No enlarged abdominal or pelvic lymph nodes. Reproductive: The prostate gland and seminal vesicles are unremarkable. Other: No pelvic mass or adenopathy. No free pelvic fluid collections. No inguinal mass or adenopathy. No abdominal wall hernia or subcutaneous lesions. Musculoskeletal: No significant bony findings. No bone lesions to suggest metastatic disease. IMPRESSION: Stable right-sided pulmonary lesions. No new or progressive findings. No mediastinal or hilar mass or lymphadenopathy. No findings for abdominal/ pelvic metastatic disease or acute findings. Electronically Signed   By: Marijo Sanes M.D.   On: 04/14/2016 12:56   Ct Abdomen Pelvis W Contrast  Result Date: 04/14/2016 CLINICAL DATA:  Recurrent non-small cell lung cancer, status post right upper lobe lobectomy. EXAM: CT CHEST, ABDOMEN, AND PELVIS WITH CONTRAST TECHNIQUE: Multidetector CT imaging of the chest, abdomen and pelvis was performed following the standard protocol during bolus administration of intravenous contrast. CONTRAST:  175m ISOVUE-300 IOPAMIDOL (ISOVUE-300) INJECTION 61% COMPARISON:  None. FINDINGS: Recist 1.1 Target lesions: 1.  Right lower lobe pulmonary nodule on image number 47 measures 16 x 15 mm and previously measured 18 x 14 mm. 2. Right lower lobe pulmonary nodule on image 51 measures 16 x 6 mm. When remeasured on the prior study on the same image it measured 16 x 6 mm. Non target lesions: 1. Sub solid right anterior lower lobe nodule.  Not identified. 2. Right lower lobe pulmonary nodule on image number 63 measures 6.5 mm. Previous 7 mm. CT CHEST FINDINGS Chest wall: No chest wall masses, supraclavicular or axillary adenopathy. The thyroid gland appears normal. Cardiovascular: The heart is normal in size. No pericardial effusion. The aorta is normal in caliber. No dissection. The branch vessels are patent. No significant coronary artery calcifications. Mediastinum/Nodes: No mediastinal or hilar mass or lymphadenopathy. Stable surgical  changes in the right hilum from a previous right upper lobe lobectomy. Lungs/Pleura: Loss of volume in the right lung and scarring changes related to prior right upper lobe lobectomy. No acute overlying pulmonary findings. 16 x 15 mm triangular density in the right lung on image 47 appears stable when compared to prior exams. No findings for recurrent tumor. 16 x 6 mm irregular linear but somewhat nodular appearing density in the right lung on image 51 is stable. Right lung nodule on image 63 measures 6.5 mm and previously measured 7 mm. New line no new pulmonary lesions. No acute overlying pulmonary process. The left lung remains clear. Musculoskeletal: No significant bony findings. CT ABDOMEN PELVIS FINDINGS Hepatobiliary: No focal hepatic lesions or intrahepatic biliary dilatation. Stable calcified gallstone in the gallbladder. Pancreas: No focal pancreatic lesions or acute inflammatory changes. No ductal dilatation. Spleen: Normal size.  No focal lesions. Adrenals/Urinary Tract: The adrenal glands are normal. Both kidneys are unremarkable and stable. Stomach/Bowel: The stomach, duodenum, small  bowel and colon are unremarkable. No inflammatory changes, mass lesions or obstructive findings. The terminal ileum is normal. The appendix is normal. Vascular/Lymphatic: No significant vascular findings are present. No enlarged abdominal or pelvic lymph nodes. Reproductive: The prostate gland and seminal vesicles are unremarkable. Other: No pelvic mass or adenopathy. No free pelvic fluid collections. No inguinal mass or adenopathy. No abdominal wall hernia or subcutaneous lesions. Musculoskeletal: No significant bony findings. No bone lesions to suggest metastatic disease. IMPRESSION: Stable right-sided pulmonary lesions. No new or progressive findings. No mediastinal or hilar mass or lymphadenopathy. No findings for abdominal/ pelvic metastatic disease or acute findings. Electronically Signed   By: Marijo Sanes M.D.   On: 04/14/2016 12:56    ASSESSMENT AND PLAN:  This is a very pleasant 71 years old white male with recurrent non-small cell lung cancer, adenocarcinoma with positive EGFR mutation in exon 21. The patient is currently on treatment with Tarceva 100 mg by mouth daily status post 23 months. He has been tolerating the treatment well with no significant adverse effects. I recommended for the patient to continue his treatment as scheduled. He will continue to come back for follow-up visit every 2 weeks according to the clinical trial protocol for the next 2 visit. He was advised to call immediately if he has any concerning symptoms in the interval. The patient voices understanding of current disease status and treatment options and is in agreement with the current care plan.  All questions were answered. The patient knows to call the clinic with any problems, questions or concerns. We can certainly see the patient much sooner if necessary. I spent 10 minutes counseling the patient face to face. The total time spent in the appointment was 15 minutes.  Disclaimer: This note was dictated with  voice recognition software. Similar sounding words can inadvertently be transcribed and may not be corrected upon review.

## 2016-05-05 NOTE — Progress Notes (Signed)
05/05/2016 Patient in to clinic today for evaluation prior to initiating treatment cycle 51. Upon arrival to the clinic, PRO questionnaires were completed independently by the patient. Patient then proceeded to lab for collection of routine blood samples, noting that all procedures occurring today are covered by the research study, as a non-standard of care visit. He reports no changes in previously reported side effects, although he does describe his fatigue as being more moderate in nature, since the spring after beginning study treatment. He reports continuing his usual activities, but clearly had a decrease in stamina to the point that he was not able to continue activity for the usual extended period of time. Otherwise, he has no change to report in his symptoms, and he has no new symptoms to report. Patient returned his own personal calendar for the month of January, documenting days erlotinib was taken, showing no missed doses. The patient has had no hospitalizations, outpatient or ED visits. He had a follow-up visit with the CVTS surgeon on 04/26/2016. He is not receiving home health care or any other type of non-protocol visits related to study therapy or disease since the last assessment on 02/11/16. Discussed upcoming study end of treatment, although it is expected that patient will continue on his current erlotinib therapy while off study. Patient and MD are aware of the final cycle occurring in two weeks on February 15th, with the final study treatment at the end of cycle 52, on 06/01/16 (end of two years of study treatment, per study visit schedule). Patient will then return for FU visit 1 approximately 35 days from the last dose of study treatment, which will also coincide with the new CT scan schedule of every 12 weeks, following two years on study. Patient and MD are in agreement with this plan. Cindy S. Brigitte Pulse BSN, RN, [place] 05/05/2016 10:01 AM  Adverse Event Log Study/Protocol: BMS [id number] Cycle  50: 04/21/2016 - 05/05/2016 (end of cycle = 05/04/16) Event Grade Onset Date Resolved Date Attribution to erlotinib Comments  Fatigue 2 08/14/2014 ongoing Not related   Dyspnea on exertion 1 09/11/14 ongoing Not related Intermittent, stable  Nail infection - right first toe 1 11/17/14 ongoing Not related   Rash - head, chest, neck, back (intermittent) 1 01/01/15 ongoing Related   Right flank pain, int. 2 11/2015 ongoing Not related Improved to grade 1  Anemia 1 02/24/16 ongoing Not related   GE reflux, intermittent 2 03/07/16 ongoing Not related   Other hematology lab abnormalities not felt to be clinically significant. Cindy S. Brigitte Pulse BSN, RN, [facility] 06/13/2016 3:43 PM

## 2016-05-07 ENCOUNTER — Telehealth: Payer: Self-pay | Admitting: Internal Medicine

## 2016-05-07 NOTE — Telephone Encounter (Signed)
Per 05/05/16 los, follow up with labs on 05/19/16. Appointments scheduled previously.

## 2016-05-12 ENCOUNTER — Emergency Department (HOSPITAL_COMMUNITY): Payer: Medicare Other

## 2016-05-12 ENCOUNTER — Encounter (HOSPITAL_COMMUNITY): Payer: Self-pay | Admitting: Emergency Medicine

## 2016-05-12 ENCOUNTER — Emergency Department (HOSPITAL_COMMUNITY)
Admission: EM | Admit: 2016-05-12 | Discharge: 2016-05-13 | Disposition: A | Discharge status: Home / Self Care | Payer: Medicare Other | Attending: Emergency Medicine | Admitting: Emergency Medicine

## 2016-05-12 DIAGNOSIS — Z7982 Long term (current) use of aspirin: Secondary | ICD-10-CM | POA: Insufficient documentation

## 2016-05-12 DIAGNOSIS — I10 Essential (primary) hypertension: Secondary | ICD-10-CM | POA: Diagnosis not present

## 2016-05-12 DIAGNOSIS — E039 Hypothyroidism, unspecified: Secondary | ICD-10-CM | POA: Diagnosis not present

## 2016-05-12 DIAGNOSIS — R202 Paresthesia of skin: Secondary | ICD-10-CM

## 2016-05-12 DIAGNOSIS — R200 Anesthesia of skin: Secondary | ICD-10-CM | POA: Diagnosis not present

## 2016-05-12 DIAGNOSIS — Z5181 Encounter for therapeutic drug level monitoring: Secondary | ICD-10-CM | POA: Insufficient documentation

## 2016-05-12 DIAGNOSIS — Z79899 Other long term (current) drug therapy: Secondary | ICD-10-CM | POA: Diagnosis not present

## 2016-05-12 DIAGNOSIS — M4802 Spinal stenosis, cervical region: Secondary | ICD-10-CM | POA: Insufficient documentation

## 2016-05-12 LAB — CBC
HCT: 37.5 % — ABNORMAL LOW (ref 39.0–52.0)
Hemoglobin: 12.6 g/dL — ABNORMAL LOW (ref 13.0–17.0)
MCH: 32.4 pg (ref 26.0–34.0)
MCHC: 33.6 g/dL (ref 30.0–36.0)
MCV: 96.4 fL (ref 78.0–100.0)
PLATELETS: 210 10*3/uL (ref 150–400)
RBC: 3.89 MIL/uL — ABNORMAL LOW (ref 4.22–5.81)
RDW: 13.5 % (ref 11.5–15.5)
WBC: 5.3 10*3/uL (ref 4.0–10.5)

## 2016-05-12 LAB — COMPREHENSIVE METABOLIC PANEL
ALBUMIN: 4.1 g/dL (ref 3.5–5.0)
ALT: 26 U/L (ref 17–63)
ANION GAP: 8 (ref 5–15)
AST: 34 U/L (ref 15–41)
Alkaline Phosphatase: 53 U/L (ref 38–126)
BUN: 12 mg/dL (ref 6–20)
CHLORIDE: 100 mmol/L — AB (ref 101–111)
CO2: 29 mmol/L (ref 22–32)
CREATININE: 1.09 mg/dL (ref 0.61–1.24)
Calcium: 9.5 mg/dL (ref 8.9–10.3)
GFR calc non Af Amer: >60 mL/min (ref >60)
Glucose, Bld: 120 mg/dL — ABNORMAL HIGH (ref 65–99)
Potassium: 3.7 mmol/L (ref 3.5–5.1)
SODIUM: 137 mmol/L (ref 135–145)
Total Bilirubin: 0.4 mg/dL (ref 0.3–1.2)
Total Protein: 6.7 g/dL (ref 6.5–8.1)

## 2016-05-12 LAB — PROTIME-INR
INR: 1.03
PROTHROMBIN TIME: 13.5 s (ref 11.4–15.2)

## 2016-05-12 LAB — DIFFERENTIAL
BASOS PCT: 1 %
Basophils Absolute: 0 10*3/uL (ref 0.0–0.1)
Eosinophils Absolute: 0.2 10*3/uL (ref 0.0–0.7)
Eosinophils Relative: 4 %
Lymphocytes Relative: 32 %
Lymphs Abs: 1.7 10*3/uL (ref 0.7–4.0)
MONO ABS: 0.5 10*3/uL (ref 0.1–1.0)
Monocytes Relative: 10 %
NEUTROS ABS: 2.8 10*3/uL (ref 1.7–7.7)
NEUTROS PCT: 53 %

## 2016-05-12 LAB — I-STAT TROPONIN, ED
TROPONIN I, POC: 0 ng/mL (ref 0.00–0.08)
TROPONIN I, POC: 0 ng/mL (ref 0.00–0.08)

## 2016-05-12 LAB — APTT: APTT: 32 s (ref 24–36)

## 2016-05-12 LAB — I-STAT CHEM 8, ED
BUN: 14 mg/dL (ref 6–20)
Calcium, Ion: 1.16 mmol/L (ref 1.15–1.40)
Chloride: 98 mmol/L — ABNORMAL LOW (ref 101–111)
Creatinine, Ser: 1.1 mg/dL (ref 0.61–1.24)
Glucose, Bld: 117 mg/dL — ABNORMAL HIGH (ref 65–99)
HCT: 38 % — ABNORMAL LOW (ref 39.0–52.0)
HEMOGLOBIN: 12.9 g/dL — AB (ref 13.0–17.0)
POTASSIUM: 3.6 mmol/L (ref 3.5–5.1)
SODIUM: 139 mmol/L (ref 135–145)
TCO2: 30 mmol/L (ref 0–100)

## 2016-05-12 MED ORDER — LORAZEPAM 2 MG/ML IJ SOLN
1.0000 mg | Freq: Once | INTRAMUSCULAR | Status: AC
  Administered 2016-05-12: 1 mg via INTRAVENOUS
  Filled 2016-05-12: qty 1

## 2016-05-12 MED ORDER — METHYLPREDNISOLONE 4 MG PO TBPK: ORAL_TABLET | ORAL | 0 refills | Status: DC

## 2016-05-12 MED ORDER — NAPROXEN 375 MG PO TABS: 375.0000 mg | ORAL_TABLET | Freq: Two times a day (BID) | ORAL | 0 refills | Status: DC | PRN

## 2016-05-12 MED ORDER — GADOBENATE DIMEGLUMINE 529 MG/ML IV SOLN
20.0000 mL | Freq: Once | INTRAVENOUS | Status: AC | PRN
  Administered 2016-05-12: 20 mL via INTRAVENOUS

## 2016-05-12 NOTE — ED Notes (Signed)
Delay in lab draw,  Pt not in room 

## 2016-05-12 NOTE — ED Triage Notes (Signed)
Pt sts numbness in left arm and shoulder starting yesterday; no other sx noted

## 2016-05-12 NOTE — ED Provider Notes (Signed)
[facility] DEPT Provider Note   CSN: [id number] Arrival date & time: 05/12/16  1513     History   Chief Complaint Chief Complaint  Patient presents with  . Numbness    HPI [name] is a 71 y.o. male.  HPI  72 yo M with PMHx of HTN, HLD, PNA who p/w transient arm numbness. Pt states he was in his usual state of health today when he noticed a numbness sensation in his left arm, radiating from his shoulder to his hand. He had a painful, stinging sensation along with this as well as decreased sensation in his hand. No associated CP or SOB. However, he became concerned for his heart as well as his h/o lung CA so he presented for evaluation. Symptoms are now improving. No CP, SOB. No recent trauma. No other numbness or weakness. No vision changes, difficulty speaking, or other complaints.  Past Medical History:  Diagnosis Date  . Cholelithiases 07/16/2015  . Complication of anesthesia 2007   quit breathing with bronchoscopy, had to spend night  . History of agent Orange exposure 44-45 yrs ago  . History of pneumonia  last 3-4 yrs ago   3 -4 different times  . Hypercholesterolemia   . Hypertension   . Hypothyroidism   . lung ca dx'd 2007   lung right  . Sinus disease    hx of  . Sleep apnea    uses cpap setting opf 12    Patient Active Problem List   Diagnosis Date Noted  . Hypothyroidism 03/10/2016  . Cholelithiases 07/16/2015  . Other long term (current) drug therapy 02/12/2015  . Rash 12/14/2014  . Encounter for antineoplastic chemotherapy 10/09/2014  . Cough with hemoptysis   . Malignant neoplasm of lower lobe of right lung ([place])   . Sinus disease   . Sleep apnea   . History of pneumonia   . History of degenerative joint disease     Past Surgical History:  Procedure Laterality Date  . COLONOSCOPY WITH PROPOFOL N/A 03/19/2013   Procedure: COLONOSCOPY WITH PROPOFOL;  Surgeon: Garlan Fair, MD;  Location: [facility] END[facility]COPY;  Service: Endoscopy;  Laterality:  N/A;  . Cystourethroscopy, [facility] T[facility].  04/16/2010  . NASAL SINUS SURGERY  1987  . Right video-assisted thoracoscopy, right upper lobectomy and  01/23/2006  . The Olympus video bronchoscope was introduced via the right  12/21/2005  . TONSILLECTOMY  1957  . Torn medial and lateral menisci, left knee.  11/06/2006       Home Medications    Prior to Admission medications   Medication Sig Start Date End Date Taking? Authorizing Provider  amitriptyline (ELAVIL) 50 MG tablet Take 50 mg by mouth at bedtime. Reported on 06/18/2015 04/04/1993  Yes Historical Provider, MD  aspirin 81 MG tablet Take 81 mg by mouth every morning.  10/03/1983  Yes Historical Provider, MD  Cholecalciferol 2000 units CAPS Take 2,000 Units by mouth daily.  08/13/14  Yes [name], MD  clindamycin (CLEOCIN-T) 1 % external solution Apply topically 2 (two) times daily. Patient taking differently: Apply 1 application topically 2 (two) times daily as needed (for skin irritation).  11/06/14  Yes Adrena E Johnson, PA-C  clindamycin (CLINDAGEL) 1 % gel Apply 1 application topically 2 (two) times daily as needed (for skin irritation).    Yes Historical Provider, MD  diltiazem (CARDIZEM CD) 360 MG 24 hr capsule Take 360 mg by mouth every morning.  10/03/1983  Yes Historical Provider, MD  erlotinib ([place])  100 MG tablet Take 1 tablet (100 mg total) by mouth daily. Take on an empty stomach 1 hour before meals or 2 hours after. 05/05/16  Yes Curt Bears, MD  levETIRAcetam (KEPPRA) 750 MG tablet Take 750 mg by mouth at bedtime.  08/12/14  Yes Historical Provider, MD  metoCLOPramide (REGLAN) 10 MG tablet Take 10 mg by mouth every 6 (six) hours as needed for nausea.  04/04/1993  Yes Historical Provider, MD  metoprolol succinate (TOPROL-XL) 25 MG 24 hr tablet Take 25 mg by mouth every morning.  05/05/12  Yes Historical Provider, MD  Multiple Vitamins-Minerals (CENTRUM SILVER PO) Take 1 tablet by mouth daily.  04/05/03  Yes Historical Provider, MD    naproxen sodium (ANAPROX) 550 MG tablet Take 550 mg by mouth 3 (three) times daily as needed for headache (or pain). Reported on 08/27/2015 04/04/1993  Yes Historical Provider, MD  nitroGLYCERIN (NITROSTAT) 0.4 MG SL tablet Place 0.4 mg under the tongue every 5 (five) minutes as needed for chest pain. Reported on 10/22/2015   Yes Historical Provider, MD  polyethylene glycol (MIRALAX / GLYCOLAX) packet Take 17 g by mouth daily. Reported on 08/27/2015   Yes Historical Provider, MD  psyllium (METAMUCIL) 58.6 % powder Take 1 packet by mouth daily. Reported on 08/27/2015   Yes Historical Provider, MD  ranitidine (ZANTAC) 75 MG tablet Take 75 mg by mouth 2 (two) times daily. 03/10/16  Yes Historical Provider, MD  rosuvastatin (CRESTOR) 5 MG tablet Take 5 mg by mouth every morning.  05/05/10  Yes Historical Provider, MD  SYNTHROID 112 MCG tablet Take 112 mcg by mouth daily. 03/07/16  Yes Historical Provider, MD  valsartan-hydrochlorothiazide (DIOVAN-HCT) 320-25 MG per tablet Take 1 tablet by mouth every morning.  05/05/10  Yes Historical Provider, MD  methylPREDNISolone (MEDROL DOSEPAK) 4 MG TBPK tablet Take as prescribed on packaging 05/12/16   Duffy Bruce, MD  naproxen (NAPROSYN) 375 MG tablet Take 1 tablet (375 mg total) by mouth 2 (two) times daily as needed for moderate pain. 05/12/16   Duffy Bruce, MD    Family History Family History  Problem Relation Age of Onset  . Hypertension Mother   . Heart attack Father   . Heart attack Brother     Social History Social History  Substance Use Topics  . Smoking status: Never Smoker  . Smokeless tobacco: Never Used  . Alcohol use No     Allergies   Lisinopril   Review of Systems Review of Systems  Constitutional: Negative for fatigue and fever.  Musculoskeletal: Positive for arthralgias.  Neurological: Positive for weakness and numbness.  All other systems reviewed and are negative.    Physical Exam Updated Vital Signs BP 157/92   Pulse 63    Temp 98.2 F (36.8 C) (Oral)   Resp 15   SpO2 98%   Physical Exam  Constitutional: He is oriented to person, place, and time. He appears well-developed and well-nourished. No distress.  HENT:  Head: Normocephalic and atraumatic.  Eyes: Conjunctivae are normal.  Neck: Normal range of motion. Neck supple.  No midline or paraspinal TTP. Positive Spurling's test with reproduction of tingling/pain with leftward deviation of neck/head and downward pressure.  Cardiovascular: Normal rate, regular rhythm and normal heart sounds.  Exam reveals no friction rub.   No murmur heard. Pulmonary/Chest: Effort normal and breath sounds normal. No respiratory distress. He has no wheezes. He has no rales.  Abdominal: He exhibits no distension.  Musculoskeletal: He exhibits no edema or tenderness.  Neurological: He is alert and oriented to person, place, and time. He exhibits normal muscle tone.  Strength 5/5 with shoulder flexion/abduction/adduction/extension, arm flexion, extension, pronation, supination, and all hand movements including finger abduction, adduction, and opposition. Normal sensation to light touch and pinprick in distal UE and LE bilaterally. Normal strength (5/5) throughout LE bilaterally. No tremor. No muscle wasting. Reflexes 2+ brachial and patellar.  Skin: Skin is warm. Capillary refill takes less than 2 seconds.  Psychiatric: He has a normal mood and affect.  Nursing note and vitals reviewed.    ED Treatments / Results  Labs (all labs ordered are listed, but only abnormal results are displayed) Labs Reviewed  CBC - Abnormal; Notable for the following:       Result Value   RBC 3.89 (*)    Hemoglobin 12.6 (*)    HCT 37.5 (*)    All other components within normal limits  COMPREHENSIVE METABOLIC PANEL - Abnormal; Notable for the following:    Chloride 100 (*)    Glucose, Bld 120 (*)    All other components within normal limits  I-STAT CHEM 8, ED - Abnormal; Notable for the  following:    Chloride 98 (*)    Glucose, Bld 117 (*)    Hemoglobin 12.9 (*)    HCT 38.0 (*)    All other components within normal limits  PROTIME-INR  APTT  DIFFERENTIAL  I-STAT TROPOININ, ED  CBG MONITORING, ED  I-STAT TROPOININ, ED    EKG  EKG Interpretation None       Radiology Ct Head Wo Contrast  Result Date: 05/12/2016 CLINICAL DATA:  Numbness in LEFT arm and shoulder since yesterday, history hypertension, lung cancer EXAM: CT HEAD WITHOUT CONTRAST TECHNIQUE: Contiguous axial images were obtained from the base of the skull through the vertex without intravenous contrast. Sagittal and coronal MPR images reconstructed from axial data set. COMPARISON:  03/08/2006 FINDINGS: Brain: Mild age-related atrophy. Normal ventricular morphology. No midline shift or mass effect. Minimal small vessel chronic ischemic changes of deep cerebral white matter. No intracranial hemorrhage, mass lesion or evidence acute infarction. No extra-axial fluid collections. Vascular: New unremarkable Skull: Intact bowel focal abnormality Sinuses/Orbits: Clear Other: N/A IMPRESSION: Atrophy with minimal small vessel chronic ischemic changes of deep cerebral white matter. No acute intracranial abnormalities. Electronically Signed   By: Lavonia Dana M.D.   On: 05/12/2016 16:32   Mr Jeri Cos And Wo Contrast  Result Date: 05/12/2016 CLINICAL DATA:  71 y/o M; numbness in the left arm and shoulder starting yesterday. History of lung cancer. EXAM: MRI HEAD WITHOUT AND WITH CONTRAST MRI CERVICAL SPINE WITHOUT AND WITH CONTRAST TECHNIQUE: Multiplanar, multiecho pulse sequences of the brain and surrounding structures, and cervical spine, to include the craniocervical junction and cervicothoracic junction, were obtained without and with intravenous contrast. CONTRAST:  18m MULTIHANCE GADOBENATE DIMEGLUMINE 529 MG/ML IV SOLN COMPARISON:  05/12/2016 CT head FINDINGS: MRI HEAD FINDINGS Brain: No acute infarction, hemorrhage,  hydrocephalus, extra-axial collection or mass lesion. No abnormal enhancement of the brain. There are a few nonspecific foci of T2 FLAIR hyperintense signal abnormality in subcortical and periventricular white matter compatible with mild chronic microvascular ischemic changes and mild brain parenchymal volume loss. Vascular: Normal flow voids. Skull and upper cervical spine: Normal marrow signal. Sinuses/Orbits: Mild paranasal sinus mucosal thickening and small right maxillary sinus mucous retention cyst. No abnormal signal of the mastoid air cells. Orbits are unremarkable. Other: None. MRI CERVICAL SPINE FINDINGS Alignment: Straightening of cervical lordosis.  No listhesis. Vertebrae: Mild edema and enhancement at the left C3-4 facet joint is likely degenerative. No evidence for discitis, acute fracture, or suspicious osseous lesion. Cord: No abnormal cord signal or cord enhancement. Posterior Fossa, vertebral arteries, paraspinal tissues: Negative. Disc levels: C2-3: Uncovertebral and facet hypertrophy greater on the right with mild left and moderate right foraminal narrowing. No significant canal stenosis. C3-4: Disc osteophyte complex eccentric to the right and bilateral uncovertebral and facet hypertrophy. Mild-to-moderate bilateral foraminal narrowing. Mild canal stenosis. C4-5: Disc osteophyte complex and bilateral uncovertebral and facet hypertrophy. Moderate bilateral foraminal narrowing and moderate canal stenosis with slight anterior cord flattening. C5-6: Moderate disc osteophyte complex bilateral uncovertebral and facet hypertrophy. Moderate to severe bilateral foraminal narrowing and severe canal stenosis with cord flattening. C6-7: Disc osteophyte complex and central disc protrusion combined with bilateral uncovertebral and facet hypertrophy. Mild bilateral foraminal narrowing. Moderate to severe canal stenosis with anterior cord impingement and flattening by the disc protrusion. C7-T1: No significant  disc displacement, foraminal narrowing, or canal stenosis. IMPRESSION: 1. No acute intracranial abnormality identified. No abnormal enhancement of the brain. 2. Mild chronic microvascular ischemic changes and parenchymal volume loss of the brain. 3. Mild paranasal sinus disease. 4. No acute osseous abnormality or abnormal cord signal identified. 5. Advanced cervical spondylosis with discogenic and facet degenerative changes most pronounced at the C5 through C7 levels. 6. Multiple levels of canal stenosis moderate at C4-5, severe at C5-6, and moderate to severe at C6-7. At C5-6 and C6-7 there is anterior cord impingement and flattening. 7. Multiple levels of mild-to-moderate foraminal narrowing bilaterally and moderate to severe foraminal narrowing at the C5-6 level. Electronically Signed   By: Kristine Garbe M.D.   On: 05/12/2016 23:15   Mr Cervical Spine W Or Wo Contrast  Result Date: 05/12/2016 CLINICAL DATA:  71 y/o M; numbness in the left arm and shoulder starting yesterday. History of lung cancer. EXAM: MRI HEAD WITHOUT AND WITH CONTRAST MRI CERVICAL SPINE WITHOUT AND WITH CONTRAST TECHNIQUE: Multiplanar, multiecho pulse sequences of the brain and surrounding structures, and cervical spine, to include the craniocervical junction and cervicothoracic junction, were obtained without and with intravenous contrast. CONTRAST:  26m MULTIHANCE GADOBENATE DIMEGLUMINE 529 MG/ML IV SOLN COMPARISON:  05/12/2016 CT head FINDINGS: MRI HEAD FINDINGS Brain: No acute infarction, hemorrhage, hydrocephalus, extra-axial collection or mass lesion. No abnormal enhancement of the brain. There are a few nonspecific foci of T2 FLAIR hyperintense signal abnormality in subcortical and periventricular white matter compatible with mild chronic microvascular ischemic changes and mild brain parenchymal volume loss. Vascular: Normal flow voids. Skull and upper cervical spine: Normal marrow signal. Sinuses/Orbits: Mild paranasal  sinus mucosal thickening and small right maxillary sinus mucous retention cyst. No abnormal signal of the mastoid air cells. Orbits are unremarkable. Other: None. MRI CERVICAL SPINE FINDINGS Alignment: Straightening of cervical lordosis.  No listhesis. Vertebrae: Mild edema and enhancement at the left C3-4 facet joint is likely degenerative. No evidence for discitis, acute fracture, or suspicious osseous lesion. Cord: No abnormal cord signal or cord enhancement. Posterior Fossa, vertebral arteries, paraspinal tissues: Negative. Disc levels: C2-3: Uncovertebral and facet hypertrophy greater on the right with mild left and moderate right foraminal narrowing. No significant canal stenosis. C3-4: Disc osteophyte complex eccentric to the right and bilateral uncovertebral and facet hypertrophy. Mild-to-moderate bilateral foraminal narrowing. Mild canal stenosis. C4-5: Disc osteophyte complex and bilateral uncovertebral and facet hypertrophy. Moderate bilateral foraminal narrowing and moderate canal stenosis with slight anterior cord flattening. C5-6: Moderate disc osteophyte  complex bilateral uncovertebral and facet hypertrophy. Moderate to severe bilateral foraminal narrowing and severe canal stenosis with cord flattening. C6-7: Disc osteophyte complex and central disc protrusion combined with bilateral uncovertebral and facet hypertrophy. Mild bilateral foraminal narrowing. Moderate to severe canal stenosis with anterior cord impingement and flattening by the disc protrusion. C7-T1: No significant disc displacement, foraminal narrowing, or canal stenosis. IMPRESSION: 1. No acute intracranial abnormality identified. No abnormal enhancement of the brain. 2. Mild chronic microvascular ischemic changes and parenchymal volume loss of the brain. 3. Mild paranasal sinus disease. 4. No acute osseous abnormality or abnormal cord signal identified. 5. Advanced cervical spondylosis with discogenic and facet degenerative changes  most pronounced at the C5 through C7 levels. 6. Multiple levels of canal stenosis moderate at C4-5, severe at C5-6, and moderate to severe at C6-7. At C5-6 and C6-7 there is anterior cord impingement and flattening. 7. Multiple levels of mild-to-moderate foraminal narrowing bilaterally and moderate to severe foraminal narrowing at the C5-6 level. Electronically Signed   By: Kristine Garbe M.D.   On: 05/12/2016 23:15    Procedures Procedures (including critical care time)  Medications Ordered in ED Medications  LORazepam (ATIVAN) injection 1 mg (1 mg Intravenous Given 05/12/16 2130)  gadobenate dimeglumine (MULTIHANCE) injection 20 mL (20 mLs Intravenous Contrast Given 05/12/16 2238)     Initial Impression / Assessment and Plan / ED Course  I have reviewed the triage vital signs and the nursing notes.  Pertinent labs & imaging results that were available during my care of the patient were reviewed by me and considered in my medical decision making (see chart for details).  71 yo M with PMhx of lung CA s/p lobectomy, HTN, here with left-sided arm tingling/numbness, now resolved. On arrival, VSS and WNL. Exam as above. Pt is now fully neurologically intact in b/l UE and LE. History, exam is actually most c/w likely transient cervical radiculopathy, possibly 2/2 cervical stenosis. No signs of ongoing neuro compromise. Fully intact reflexes and strength. I do not suspect ACS/cardiac etiology and delta trop is neg, normal EKG. Pain is not c/w dissection. Given h/o CA, must eval fo bony pathology so will obtain MR for further assessment. Distribution of tingling and sx is more c/w cervical etiology, with low suspicion for TIA/CVA but will obtain MR Brain as well.  MR shows significant cervical central and foraminal stenosis, with one area of effacement of cord. There is no cord edema, however, or signs fo central cord or acute inflammation. Suspect that pt's sx are 2/2 transient worsening of  chronic spinal stenosis. He is NVI distally now and is requesting d/c. Will discuss with Neurosurgery given extent of findings, MR findings.  10:40 PM: Paged Dr. Arneta Cliche of [facility] with MRI results. Spoke with answering service, will await call back. Pt has no ongoing numbness, and I suspect this can be managed as an outpt but would like to confirm.  2:35 AM Awaiting call back. Discussed with answering service once more.  2:35 AM Patient requesting to leave. He understands that I am awaiting consult but has to return home. No signs of bony/ligamentous injury, no ongoing neurological deficits, so no apparent emergent pathology to suggest spinal instability/risk of damage without collar. Nonetheless, I discussed the risks of ongoing damage with pt including irreversible weakness, paralysis, numbness. Will await call-back, advised pt to call [facility] in AM for setting up appt in the next week with good return precautions. Pt understands risks, benefits of leaving. Will d/c. I have  paged NSG once more and Junius Creamer, PA, is to f/u on phone call if returned.  Final Clinical Impressions(s) / ED Diagnoses   Final diagnoses:  Foraminal stenosis of cervical region  Paresthesia    New Prescriptions Discharge Medication List as of 05/13/2016 12:19 AM    START taking these medications   Details  methylPREDNISolone (MEDROL DOSEPAK) 4 MG TBPK tablet Take as prescribed on packaging, Print    naproxen (NAPROSYN) 375 MG tablet Take 1 tablet (375 mg total) by mouth 2 (two) times daily as needed for moderate pain., Starting Thu 05/12/2016, Print         Duffy Bruce, MD 05/13/16 [phone]

## 2016-05-13 ENCOUNTER — Telehealth: Payer: Self-pay | Admitting: *Deleted

## 2016-05-13 NOTE — ED Notes (Signed)
Pt expressed desire to leave prior to EDP receiving call-back from neuro. Pt advised by EDP that the pt may need to return to ED to be placed in cervical collar at the request of Neuro. This RN took the pts phone number and explained the situation to the pt and his wife. Pt expressed understanding.

## 2016-05-13 NOTE — Telephone Encounter (Signed)
05/13/2016 Received telephone call from patient this morning, following an ED visit yesterday for complaint of left arm pain and tingling. Patient reports that he has a herniated disc in his neck that needs follow-up. He called to inquire whether it was okay for him to take the prescription for methylprednisolone that was prescribed by Dr. Ellender Hose. Told patient that he should proceed with this short-term steroid treatment for this acute problem, and that I would notify Dr. [name] of this new medication. Cindy S. Brigitte Pulse BSN, RN, [place] 05/13/2016 11:03 AM

## 2016-05-16 NOTE — Telephone Encounter (Signed)
Yes. He can take steroids.

## 2016-05-18 ENCOUNTER — Encounter: Payer: Self-pay | Admitting: Internal Medicine

## 2016-05-19 ENCOUNTER — Encounter: Payer: Medicare Other | Admitting: *Deleted

## 2016-05-19 ENCOUNTER — Other Ambulatory Visit (HOSPITAL_BASED_OUTPATIENT_CLINIC_OR_DEPARTMENT_OTHER): Payer: Medicare Other

## 2016-05-19 ENCOUNTER — Ambulatory Visit (HOSPITAL_BASED_OUTPATIENT_CLINIC_OR_DEPARTMENT_OTHER): Payer: Medicare Other | Admitting: Internal Medicine

## 2016-05-19 ENCOUNTER — Encounter: Payer: Self-pay | Admitting: Internal Medicine

## 2016-05-19 ENCOUNTER — Encounter: Payer: Self-pay | Admitting: Medical Oncology

## 2016-05-19 ENCOUNTER — Other Ambulatory Visit (HOSPITAL_COMMUNITY)
Admission: AD | Admit: 2016-05-19 | Discharge: 2016-05-19 | Disposition: A | Discharge status: Home / Self Care | Payer: Medicare Other | Source: Ambulatory Visit | Attending: Internal Medicine | Admitting: Internal Medicine

## 2016-05-19 ENCOUNTER — Telehealth: Payer: Self-pay | Admitting: Internal Medicine

## 2016-05-19 VITALS — BP 116/75 | HR 81 | Temp 98.1°F | Resp 18 | Ht 72.0 in | Wt 239.0 lb

## 2016-05-19 DIAGNOSIS — Z006 Encounter for examination for normal comparison and control in clinical research program: Secondary | ICD-10-CM | POA: Diagnosis not present

## 2016-05-19 DIAGNOSIS — C3431 Malignant neoplasm of lower lobe, right bronchus or lung: Secondary | ICD-10-CM

## 2016-05-19 DIAGNOSIS — Z79899 Other long term (current) drug therapy: Secondary | ICD-10-CM

## 2016-05-19 DIAGNOSIS — Z5111 Encounter for antineoplastic chemotherapy: Secondary | ICD-10-CM

## 2016-05-19 LAB — COMPREHENSIVE METABOLIC PANEL
ALBUMIN: 4 g/dL (ref 3.5–5.0)
ALT: 27 U/L (ref 0–55)
ANION GAP: 10 meq/L (ref 3–11)
AST: 20 U/L (ref 5–34)
Alkaline Phosphatase: 57 U/L (ref 40–150)
BILIRUBIN TOTAL: 0.51 mg/dL (ref 0.20–1.20)
BUN: 19.7 mg/dL (ref 7.0–26.0)
CALCIUM: 10.1 mg/dL (ref 8.4–10.4)
CO2: 29 meq/L (ref 22–29)
CREATININE: 1.2 mg/dL (ref 0.7–1.3)
Chloride: 103 mEq/L (ref 98–109)
EGFR: 63 mL/min/{1.73_m2} — ABNORMAL LOW (ref >90)
Glucose: 98 mg/dl (ref 70–140)
Potassium: 3.9 mEq/L (ref 3.5–5.1)
Sodium: 142 mEq/L (ref 136–145)
TOTAL PROTEIN: 6.7 g/dL (ref 6.4–8.3)

## 2016-05-19 LAB — CBC WITH DIFFERENTIAL/PLATELET
BASO%: 0.6 % (ref 0.0–2.0)
Basophils Absolute: 0 10*3/uL (ref 0.0–0.1)
EOS%: 4 % (ref 0.0–7.0)
Eosinophils Absolute: 0.3 10*3/uL (ref 0.0–0.5)
HEMATOCRIT: 40.2 % (ref 38.4–49.9)
HEMOGLOBIN: 13.6 g/dL (ref 13.0–17.1)
LYMPH%: 41 % (ref 14.0–49.0)
MCH: 33 pg (ref 27.2–33.4)
MCHC: 33.9 g/dL (ref 32.0–36.0)
MCV: 97.3 fL (ref 79.3–98.0)
MONO#: 0.9 10*3/uL (ref 0.1–0.9)
MONO%: 10.8 % (ref 0.0–14.0)
NEUT%: 43.6 % (ref 39.0–75.0)
NEUTROS ABS: 3.5 10*3/uL (ref 1.5–6.5)
PLATELETS: 249 10*3/uL (ref 140–400)
RBC: 4.13 10*6/uL — AB (ref 4.20–5.82)
RDW: 14.4 % (ref 11.0–14.6)
WBC: 7.9 10*3/uL (ref 4.0–10.3)
lymph#: 3.2 10*3/uL (ref 0.9–3.3)

## 2016-05-19 LAB — LACTATE DEHYDROGENASE: LDH: 170 U/L (ref 125–245)

## 2016-05-19 LAB — PHOSPHORUS: Phosphorus: 4.1 mg/dL (ref 2.5–4.6)

## 2016-05-19 LAB — TSH: TSH: 3.046 m[IU]/L (ref 0.320–4.118)

## 2016-05-19 LAB — MAGNESIUM: Magnesium: 2.6 mg/dl — ABNORMAL HIGH (ref 1.5–2.5)

## 2016-05-19 MED ORDER — ERLOTINIB HCL 100 MG PO TABS: 100.0000 mg | ORAL_TABLET | Freq: Every day | ORAL | Status: DC

## 2016-05-19 NOTE — Progress Notes (Signed)
05/19/2016 Patient in to clinic today for evaluation prior to initiating treatment cycle 52. Vital signs were obtained at rest. With upcoming end of treatment occurring by 06/05/2016, the final dose of study medication will be taken on 06/01/2016 (C52, D14). Patient is then scheduled to return for the EOT visit on 06/08/2016, in the window 0-7 days from last dose of study treatment. He has no change to report in his symptoms, and he has no new symptoms to report, other than those that occurred leading up to his ED visit last week. Additional details are outlined in the AE table below. Plans made for scheduling FU visit 1 approximately 35 days from the last dose of study treatment, which will also coincide with the new CT scan schedule of every 12 weeks, following two years on study. Patient and MD are in agreement with this plan. Cindy S. Brigitte Pulse BSN, RN, CCRP 05/19/2016 1:11 PM  Adverse Event Log Study/Protocol: BMS [id number] Cycle 51: 2/1 - 05/19/2016 (end of cycle = 05/18/16) Event Grade Onset Date Resolved Date Attribution to erlotinib Comments  Fatigue 2 08/14/14 ongoing Not related   Dyspnea on exertion 1 09/11/14 ongoing Not related Intermittent, stable  Nail infection - right first toe 1 11/17/14 ongoing Not related   Rash - head, chest, neck, back (intermittent) 1 01/01/15 ongoing Related   Right flank pain, int. 2 11/2015 ongoing Not related Improved to grade 1  Anemia 1 02/24/16 05/19/16 Not related   GE reflux, intermittent 2 03/07/16 ongoing Not related   Hypertension 1 05/05/16 05/19/16 Not related   Paresthesia, cervical spine 2 05/12/16 ongoing Not related    Not clinically significant: abnormal labs on 05/19/2016  - RBC 4.1 (reference range 4.20-5.82 10e6/microliter)  - grade 1 hypermagnesemia  Cindy S. Brigitte Pulse BSN, RN, [place] 06/17/2016 10:29 AM

## 2016-05-19 NOTE — Progress Notes (Signed)
[place] [phone]   [phone]  OFFICE PROGRESS NOTE  [name],[name], MD 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  PRIOR THERAPY: 1) Status post right upper lobectomy under the care of Dr. Roxan Hockey on 01/23/2006.  2) Tarceva 150 mg by mouth daily as part of the BMS checkmate 370 clinical trial. Status post 6 weeks of treatment.  CURRENT THERAPY: Tarceva 100 mg by mouth daily as part of the BMS Checkmate 370 clinical trial. First dose started 07/17/2014 status post 23 months of treatment.  INTERVAL HISTORY: [name] 71 y.o. male came to the clinic today for follow-up visit accompanied by his wife. The patient is feeling fine today with no specific complaints. He is tolerating his current treatment with Tarceva fairly well. He denied having any chest pain, shortness of breath, cough or hemoptysis. He has no fever or chills. He is complaining of numbness in the left arm and shoulder last week and he had MRI of the cervical spine as well as MRI of the head with and without contrast that showed no evidence of metastatic disease but showed advanced cervical spondylosis. He was treated with a course of Medrol Dosepak. He is feeling much better. The patient denied having any nausea or vomiting. He denied having any significant weight loss or night sweats. He is here today for evaluation and repeat blood work.  MEDICAL HISTORY: Past Medical History:  Diagnosis Date  . Cholelithiases 07/16/2015  . Complication of anesthesia 2007   quit breathing with bronchoscopy, had to spend night  . History of agent Orange exposure 44-45 yrs ago  . History of pneumonia  last 3-4 yrs ago   3 -4 different times  . Hypercholesterolemia   . Hypertension   .  Hypothyroidism   . lung ca dx'd 2007   lung right  . Sinus disease    hx of  . Sleep apnea    uses cpap setting opf 12    ALLERGIES:  is allergic to lisinopril.  MEDICATIONS:  Current Outpatient Prescriptions  Medication Sig Dispense Refill  . amitriptyline (ELAVIL) 50 MG tablet Take 50 mg by mouth at bedtime. Reported on 06/18/2015    . aspirin 81 MG tablet Take 81 mg by mouth every morning.     . Cholecalciferol 2000 units CAPS Take 2,000 Units by mouth daily.     . clindamycin (CLEOCIN-T) 1 % external solution Apply topically 2 (two) times daily. (Patient taking differently: Apply 1 application topically 2 (two) times daily as needed (for skin irritation). ) 240 mL 2  . clindamycin (CLINDAGEL) 1 % gel Apply 1 application topically 2 (two) times daily as needed (for skin irritation).     [name] diltiazem (CARDIZEM CD) 360 MG 24 hr capsule Take 360 mg by mouth every morning.     . erlotinib (TARCEVA) 100 MG tablet Take 1 tablet (100 mg total) by mouth daily. Take on an empty stomach 1 hour before meals or 2 hours after.    . levETIRAcetam (KEPPRA) 750 MG tablet Take 750 mg by mouth at bedtime.     . metoCLOPramide (REGLAN) 10 MG tablet Take 10 mg by mouth every 6 (six) hours as needed for nausea.     . metoprolol succinate (TOPROL-XL) 25 MG 24 hr tablet Take  25 mg by mouth every morning.     . Multiple Vitamins-Minerals (CENTRUM SILVER PO) Take 1 tablet by mouth daily.     . naproxen sodium (ANAPROX) 550 MG tablet Take 550 mg by mouth 3 (three) times daily as needed for headache (or pain). Reported on 08/27/2015    . polyethylene glycol (MIRALAX / GLYCOLAX) packet Take 17 g by mouth daily. Reported on 08/27/2015    . psyllium (METAMUCIL) 58.6 % powder Take 1 packet by mouth daily. Reported on 08/27/2015    . ranitidine (ZANTAC) 75 MG tablet Take 75 mg by mouth 2 (two) times daily.    . rosuvastatin (CRESTOR) 5 MG tablet Take 5 mg by mouth every morning.     [name] SYNTHROID 112 MCG tablet Take 112  mcg by mouth daily.    . valsartan-hydrochlorothiazide (DIOVAN-HCT) 320-25 MG per tablet Take 1 tablet by mouth every morning.     . nitroGLYCERIN (NITROSTAT) 0.4 MG SL tablet Place 0.4 mg under the tongue every 5 (five) minutes as needed for chest pain. Reported on 10/22/2015     No current facility-administered medications for this visit.     SURGICAL HISTORY:  Past Surgical History:  Procedure Laterality Date  . COLONOSCOPY WITH PROPOFOL N/A 03/19/2013   Procedure: COLONOSCOPY WITH PROPOFOL;  Surgeon: Garlan Fair, MD;  Location: [facility];  Service: Endoscopy;  Laterality: N/A;  . Cystourethroscopy, [facility].  04/16/2010  . NASAL SINUS SURGERY  1987  . Right video-assisted thoracoscopy, right upper lobectomy and  01/23/2006  . The Olympus video bronchoscope was introduced via the right  12/21/2005  . TONSILLECTOMY  1957  . Torn medial and lateral menisci, left knee.  11/06/2006    REVIEW OF SYSTEMS:  A comprehensive review of systems was negative.   PHYSICAL EXAMINATION: General appearance: alert, cooperative and no distress Head: Normocephalic, without obvious abnormality, atraumatic Neck: no adenopathy, no JVD, supple, symmetrical, trachea midline and thyroid not enlarged, symmetric, no tenderness/mass/nodules Lymph nodes: Cervical, supraclavicular, and axillary nodes normal. Resp: clear to auscultation bilaterally Back: symmetric, no curvature. ROM normal. No CVA tenderness. Cardio: regular rate and rhythm, S1, S2 normal, no murmur, click, rub or gallop GI: soft, non-tender; bowel sounds normal; no masses,  no organomegaly Extremities: extremities normal, atraumatic, no cyanosis or edema  ECOG PERFORMANCE STATUS: 0 - Asymptomatic  Blood pressure 116/75, pulse 81, temperature 98.1 F (36.7 C), temperature source Oral, resp. rate 18, height 6' (1.829 m), weight 239 lb (108.4 kg), SpO2 99 %.  LABORATORY DATA: Lab Results  Component Value Date   WBC 7.9 05/19/2016     HGB 13.6 05/19/2016   HCT 40.2 05/19/2016   MCV 97.3 05/19/2016   PLT 249 05/19/2016      Chemistry      Component Value Date/Time   NA 142 05/19/2016 0940   K 3.9 05/19/2016 0940   CL 98 (L) 05/12/2016 1602   CL 102 04/24/2012 0853   CO2 29 05/19/2016 0940   BUN 19.7 05/19/2016 0940   CREATININE 1.2 05/19/2016 0940      Component Value Date/Time   CALCIUM 10.1 05/19/2016 0940   ALKPHOS 57 05/19/2016 0940   AST 20 05/19/2016 0940   ALT 27 05/19/2016 0940   BILITOT 0.51 05/19/2016 0940       RADIOGRAPHIC STUDIES: Ct Head Wo Contrast  Result Date: 05/12/2016 CLINICAL DATA:  Numbness in LEFT arm and shoulder since yesterday, history hypertension, lung cancer EXAM: CT HEAD WITHOUT CONTRAST TECHNIQUE: Contiguous axial  images were obtained from the base of the skull through the vertex without intravenous contrast. Sagittal and coronal MPR images reconstructed from axial data set. COMPARISON:  03/08/2006 FINDINGS: Brain: Mild age-related atrophy. Normal ventricular morphology. No midline shift or mass effect. Minimal small vessel chronic ischemic changes of deep cerebral white matter. No intracranial hemorrhage, mass lesion or evidence acute infarction. No extra-axial fluid collections. Vascular: New unremarkable Skull: Intact bowel focal abnormality Sinuses/Orbits: Clear Other: N/A IMPRESSION: Atrophy with minimal small vessel chronic ischemic changes of deep cerebral white matter. No acute intracranial abnormalities. Electronically Signed   By: Lavonia Dana M.D.   On: 05/12/2016 16:32   Mr Jeri Cos And Wo Contrast  Result Date: 05/12/2016 CLINICAL DATA:  71 y/o M; numbness in the left arm and shoulder starting yesterday. History of lung cancer. EXAM: MRI HEAD WITHOUT AND WITH CONTRAST MRI CERVICAL SPINE WITHOUT AND WITH CONTRAST TECHNIQUE: Multiplanar, multiecho pulse sequences of the brain and surrounding structures, and cervical spine, to include the craniocervical junction and  cervicothoracic junction, were obtained without and with intravenous contrast. CONTRAST:  8m MULTIHANCE GADOBENATE DIMEGLUMINE 529 MG/ML IV SOLN COMPARISON:  05/12/2016 CT head FINDINGS: MRI HEAD FINDINGS Brain: No acute infarction, hemorrhage, hydrocephalus, extra-axial collection or mass lesion. No abnormal enhancement of the brain. There are a few nonspecific foci of T2 FLAIR hyperintense signal abnormality in subcortical and periventricular white matter compatible with mild chronic microvascular ischemic changes and mild brain parenchymal volume loss. Vascular: Normal flow voids. Skull and upper cervical spine: Normal marrow signal. Sinuses/Orbits: Mild paranasal sinus mucosal thickening and small right maxillary sinus mucous retention cyst. No abnormal signal of the mastoid air cells. Orbits are unremarkable. Other: None. MRI CERVICAL SPINE FINDINGS Alignment: Straightening of cervical lordosis.  No listhesis. Vertebrae: Mild edema and enhancement at the left C3-4 facet joint is likely degenerative. No evidence for discitis, acute fracture, or suspicious osseous lesion. Cord: No abnormal cord signal or cord enhancement. Posterior Fossa, vertebral arteries, paraspinal tissues: Negative. Disc levels: C2-3: Uncovertebral and facet hypertrophy greater on the right with mild left and moderate right foraminal narrowing. No significant canal stenosis. C3-4: Disc osteophyte complex eccentric to the right and bilateral uncovertebral and facet hypertrophy. Mild-to-moderate bilateral foraminal narrowing. Mild canal stenosis. C4-5: Disc osteophyte complex and bilateral uncovertebral and facet hypertrophy. Moderate bilateral foraminal narrowing and moderate canal stenosis with slight anterior cord flattening. C5-6: Moderate disc osteophyte complex bilateral uncovertebral and facet hypertrophy. Moderate to severe bilateral foraminal narrowing and severe canal stenosis with cord flattening. C6-7: Disc osteophyte complex and  central disc protrusion combined with bilateral uncovertebral and facet hypertrophy. Mild bilateral foraminal narrowing. Moderate to severe canal stenosis with anterior cord impingement and flattening by the disc protrusion. C7-T1: No significant disc displacement, foraminal narrowing, or canal stenosis. IMPRESSION: 1. No acute intracranial abnormality identified. No abnormal enhancement of the brain. 2. Mild chronic microvascular ischemic changes and parenchymal volume loss of the brain. 3. Mild paranasal sinus disease. 4. No acute osseous abnormality or abnormal cord signal identified. 5. Advanced cervical spondylosis with discogenic and facet degenerative changes most pronounced at the C5 through C7 levels. 6. Multiple levels of canal stenosis moderate at C4-5, severe at C5-6, and moderate to severe at C6-7. At C5-6 and C6-7 there is anterior cord impingement and flattening. 7. Multiple levels of mild-to-moderate foraminal narrowing bilaterally and moderate to severe foraminal narrowing at the C5-6 level. Electronically Signed   By: LKristine GarbeM.D.   On: 05/12/2016 23:15   Mr  Cervical Spine W Or Wo Contrast  Result Date: 05/12/2016 CLINICAL DATA:  71 y/o M; numbness in the left arm and shoulder starting yesterday. History of lung cancer. EXAM: MRI HEAD WITHOUT AND WITH CONTRAST MRI CERVICAL SPINE WITHOUT AND WITH CONTRAST TECHNIQUE: Multiplanar, multiecho pulse sequences of the brain and surrounding structures, and cervical spine, to include the craniocervical junction and cervicothoracic junction, were obtained without and with intravenous contrast. CONTRAST:  43m MULTIHANCE GADOBENATE DIMEGLUMINE 529 MG/ML IV SOLN COMPARISON:  05/12/2016 CT head FINDINGS: MRI HEAD FINDINGS Brain: No acute infarction, hemorrhage, hydrocephalus, extra-axial collection or mass lesion. No abnormal enhancement of the brain. There are a few nonspecific foci of T2 FLAIR hyperintense signal abnormality in subcortical  and periventricular white matter compatible with mild chronic microvascular ischemic changes and mild brain parenchymal volume loss. Vascular: Normal flow voids. Skull and upper cervical spine: Normal marrow signal. Sinuses/Orbits: Mild paranasal sinus mucosal thickening and small right maxillary sinus mucous retention cyst. No abnormal signal of the mastoid air cells. Orbits are unremarkable. Other: None. MRI CERVICAL SPINE FINDINGS Alignment: Straightening of cervical lordosis.  No listhesis. Vertebrae: Mild edema and enhancement at the left C3-4 facet joint is likely degenerative. No evidence for discitis, acute fracture, or suspicious osseous lesion. Cord: No abnormal cord signal or cord enhancement. Posterior Fossa, vertebral arteries, paraspinal tissues: Negative. Disc levels: C2-3: Uncovertebral and facet hypertrophy greater on the right with mild left and moderate right foraminal narrowing. No significant canal stenosis. C3-4: Disc osteophyte complex eccentric to the right and bilateral uncovertebral and facet hypertrophy. Mild-to-moderate bilateral foraminal narrowing. Mild canal stenosis. C4-5: Disc osteophyte complex and bilateral uncovertebral and facet hypertrophy. Moderate bilateral foraminal narrowing and moderate canal stenosis with slight anterior cord flattening. C5-6: Moderate disc osteophyte complex bilateral uncovertebral and facet hypertrophy. Moderate to severe bilateral foraminal narrowing and severe canal stenosis with cord flattening. C6-7: Disc osteophyte complex and central disc protrusion combined with bilateral uncovertebral and facet hypertrophy. Mild bilateral foraminal narrowing. Moderate to severe canal stenosis with anterior cord impingement and flattening by the disc protrusion. C7-T1: No significant disc displacement, foraminal narrowing, or canal stenosis. IMPRESSION: 1. No acute intracranial abnormality identified. No abnormal enhancement of the brain. 2. Mild chronic  microvascular ischemic changes and parenchymal volume loss of the brain. 3. Mild paranasal sinus disease. 4. No acute osseous abnormality or abnormal cord signal identified. 5. Advanced cervical spondylosis with discogenic and facet degenerative changes most pronounced at the C5 through C7 levels. 6. Multiple levels of canal stenosis moderate at C4-5, severe at C5-6, and moderate to severe at C6-7. At C5-6 and C6-7 there is anterior cord impingement and flattening. 7. Multiple levels of mild-to-moderate foraminal narrowing bilaterally and moderate to severe foraminal narrowing at the C5-6 level. Electronically Signed   By: LKristine GarbeM.D.   On: 05/12/2016 23:15    ASSESSMENT AND PLAN:  This is a very pleasant 71years old white male with recurrent non-small cell lung cancer, adenocarcinoma with positive EGFR mutation in exon 21. The patient is currently on treatment with Tarceva status post 23 months. He continues to tolerate his treatment well with no significant adverse effects. I recommended for the patient to continue on Tarceva 100 mg by mouth daily. I would see him back for follow-up visit in 3 weeks for reevaluation and repeat blood work. He was advised to call immediately if he has any concerning symptoms in the interval. The patient voices understanding of current disease status and treatment options and is in  agreement with the current care plan. All questions were answered. The patient knows to call the clinic with any problems, questions or concerns. We can certainly see the patient much sooner if necessary. I spent 10 minutes counseling the patient face to face. The total time spent in the appointment was 15 minutes. Disclaimer: This note was dictated with voice recognition software. Similar sounding words can inadvertently be transcribed and may not be corrected upon review.

## 2016-05-19 NOTE — Telephone Encounter (Signed)
Appointments scheduled per 2/15 LOS. Patient given AVS report and calendars with future scheduled appointments. Follow up appointment scheduled per order for CT scan appointments on 4/3. Patient stated that he would need follow up appointment on 4/5 per MM. Patient given two bottles of contrast and instructions for CT scan appointment.

## 2016-05-20 ENCOUNTER — Encounter: Payer: Self-pay | Admitting: Internal Medicine

## 2016-05-20 NOTE — Progress Notes (Signed)
Received approval letter for Tarceva from [facility].    Approved standard Part D request Approved 05/19/16-05/19/17 30 tablets per 30 days. Member ID [id number] Event Number [id number]

## 2016-05-23 ENCOUNTER — Telehealth: Payer: Self-pay | Admitting: Internal Medicine

## 2016-05-23 NOTE — Telephone Encounter (Signed)
Patient called to have lab appointment for CT scan to be scheduled for 4/2; he would like the lab a day before the CT scan appointment. 2/19

## 2016-05-24 DIAGNOSIS — M4722 Other spondylosis with radiculopathy, cervical region: Secondary | ICD-10-CM | POA: Diagnosis not present

## 2016-05-25 MED FILL — TARCEVA 100 MG TABLET: 100 | 30 days supply | Qty: 30 | Fill #2

## 2016-05-26 ENCOUNTER — Other Ambulatory Visit: Payer: Self-pay | Admitting: Nurse Practitioner

## 2016-05-26 DIAGNOSIS — C3431 Malignant neoplasm of lower lobe, right bronchus or lung: Secondary | ICD-10-CM

## 2016-05-26 DIAGNOSIS — Z5111 Encounter for antineoplastic chemotherapy: Secondary | ICD-10-CM

## 2016-05-27 ENCOUNTER — Other Ambulatory Visit: Payer: Self-pay | Admitting: *Deleted

## 2016-05-27 ENCOUNTER — Telehealth: Payer: Self-pay | Admitting: *Deleted

## 2016-05-27 DIAGNOSIS — C3431 Malignant neoplasm of lower lobe, right bronchus or lung: Secondary | ICD-10-CM

## 2016-05-27 MED ORDER — CLINDAMYCIN PHOSPHATE 1 % EX SOLN: 1.0000 "application " | Freq: Two times a day (BID) | CUTANEOUS | 0 refills | Status: DC | PRN

## 2016-05-27 NOTE — Telephone Encounter (Signed)
05/27/2016 Patient states that he discovered his two clindamycin medications had expired. He contacted his pharmacy for a refill yesterday, but the pharmacy states they have not received an approved refill from Dr. Worthy Flank office. Refill encounter located, printed and sent to Dr. Worthy Flank nurse for follow-up. Cindy S. Brigitte Pulse BSN, RN, [place] 05/27/2016 10:24 AM

## 2016-05-27 NOTE — Telephone Encounter (Signed)
Pt request refill on rx for rash. Reviewed with MD, refill for Clindamycin sent to pt pharmacy.

## 2016-05-30 ENCOUNTER — Other Ambulatory Visit: Payer: Self-pay | Admitting: Medical Oncology

## 2016-05-30 DIAGNOSIS — C3431 Malignant neoplasm of lower lobe, right bronchus or lung: Secondary | ICD-10-CM

## 2016-05-30 DIAGNOSIS — L270 Generalized skin eruption due to drugs and medicaments taken internally: Secondary | ICD-10-CM

## 2016-05-30 MED ORDER — CLINDAMYCIN PHOSPHATE 1 % EX GEL: Freq: Two times a day (BID) | CUTANEOUS | 0 refills | Status: DC

## 2016-05-30 MED ORDER — CLINDAMYCIN PHOSPHATE 1 % EX SOLN: 1.0000 "application " | Freq: Two times a day (BID) | CUTANEOUS | 0 refills | Status: DC | PRN

## 2016-05-31 ENCOUNTER — Telehealth: Payer: Self-pay | Admitting: *Deleted

## 2016-05-31 ENCOUNTER — Telehealth: Payer: Self-pay | Admitting: Internal Medicine

## 2016-05-31 ENCOUNTER — Ambulatory Visit: Payer: Medicare Other | Admitting: Internal Medicine

## 2016-05-31 NOTE — Telephone Encounter (Signed)
lvm to inform pt MD appt 2/28 at 2 pm

## 2016-05-31 NOTE — Telephone Encounter (Signed)
Patient called wanting to know if he can be seen by MD [facility] today related to his rash. Patient states that his rash has gotten worse over the weekend. Patient states that its itching/burning all over his arms/legs.

## 2016-06-01 ENCOUNTER — Ambulatory Visit (HOSPITAL_BASED_OUTPATIENT_CLINIC_OR_DEPARTMENT_OTHER): Payer: Medicare Other | Admitting: Internal Medicine

## 2016-06-01 ENCOUNTER — Encounter: Payer: Self-pay | Admitting: Internal Medicine

## 2016-06-01 ENCOUNTER — Ambulatory Visit: Payer: Medicare Other | Admitting: Internal Medicine

## 2016-06-01 VITALS — BP 116/78 | HR 87 | Temp 97.6°F | Resp 17 | Ht 72.0 in | Wt 241.2 lb

## 2016-06-01 DIAGNOSIS — C3411 Malignant neoplasm of upper lobe, right bronchus or lung: Secondary | ICD-10-CM

## 2016-06-01 DIAGNOSIS — R21 Rash and other nonspecific skin eruption: Secondary | ICD-10-CM

## 2016-06-01 DIAGNOSIS — C3431 Malignant neoplasm of lower lobe, right bronchus or lung: Secondary | ICD-10-CM

## 2016-06-01 MED ORDER — HYDROXYZINE HCL 10 MG PO TABS: 10.0000 mg | ORAL_TABLET | Freq: Three times a day (TID) | ORAL | 0 refills | Status: DC | PRN

## 2016-06-01 MED ORDER — DIPHENHYDRAMINE-ZINC ACETATE 1-0.1 % EX CREA: TOPICAL_CREAM | Freq: Three times a day (TID) | CUTANEOUS | 0 refills | Status: DC | PRN

## 2016-06-01 NOTE — Progress Notes (Signed)
    [facility] [phone]   [phone]  OFFICE PROGRESS NOTE  [name],[name], MD 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  PRIOR THERAPY: 1) Status post right upper lobectomy under the care of Dr. Hendrickson on 01/23/2006.  2) Tarceva 150 mg by mouth daily as part of the BMS checkmate 370 clinical trial. Status post 6 weeks of treatment.  CURRENT THERAPY: Tarceva 100 mg by mouth daily as part of the BMS Checkmate 370 clinical trial. First dose started 07/17/2014 status post 24 months of treatment.  INTERVAL HISTORY: [name] 71 y.o. male came to the clinic today for follow-up visit accompanied by his wife. The patient is complaining of itching and a skin rash mainly at the cubical areas of his arms as well as the knees. He was treated with steroid injection to the shoulder 10 days ago. The rash is itching. He did not have the typical Tarceva rash on the face, chest or back. He has no weight loss or night sweats. He denied having any diarrhea. He has no nausea or vomiting. He is here today for evaluation and recommendation regarding the new skin rash and itching.   MEDICAL HISTORY: Past Medical History:  Diagnosis Date  . Cholelithiases 07/16/2015  . Complication of anesthesia 2007   quit breathing with bronchoscopy, had to spend night  . History of agent Orange exposure 44-45 yrs ago  . History of pneumonia  last 3-4 yrs ago   3 -4 different times  . Hypercholesterolemia   . Hypertension   . Hypothyroidism   . lung ca dx'd 2007   lung right  . Sinus disease    hx of  . Sleep apnea    uses cpap setting opf 12    ALLERGIES:  is allergic to lisinopril.  MEDICATIONS:  Current Outpatient Prescriptions  Medication Sig Dispense Refill  .  amitriptyline (ELAVIL) 50 MG tablet Take 50 mg by mouth at bedtime. Reported on 06/18/2015    . aspirin 81 MG tablet Take 81 mg by mouth every morning.     . Cholecalciferol 2000 units CAPS Take 2,000 Units by mouth daily.     . clindamycin (CLEOCIN-T) 1 % external solution Apply 1 application topically 2 (two) times daily as needed (for skin irritation). 240 mL 0  . clindamycin (CLINDAGEL) 1 % gel Apply topically 2 (two) times daily. 30 g 0  . diltiazem (CARDIZEM CD) 360 MG 24 hr capsule Take 360 mg by mouth every morning.     . erlotinib (TARCEVA) 100 MG tablet Take 1 tablet (100 mg total) by mouth daily. Take on an empty stomach 1 hour before meals or 2 hours after.    . levETIRAcetam (KEPPRA) 750 MG tablet Take 750 mg by mouth at bedtime.     . metoCLOPramide (REGLAN) 10 MG tablet Take 10 mg by mouth every 6 (six) hours as needed for nausea.     . metoprolol succinate (TOPROL-XL) 25 MG 24 hr tablet Take 25 mg by mouth every morning.     . Multiple Vitamins-Minerals (CENTRUM SILVER PO) Take 1 tablet by mouth daily.     . naproxen sodium (ANAPROX) 550 MG tablet Take 550 mg by mouth 3 (three) times daily as needed for headache (or pain). Reported on 08/27/2015    .   nitroGLYCERIN (NITROSTAT) 0.4 MG SL tablet Place 0.4 mg under the tongue every 5 (five) minutes as needed for chest pain. Reported on 10/22/2015    . polyethylene glycol (MIRALAX / GLYCOLAX) packet Take 17 g by mouth daily. Reported on 08/27/2015    . psyllium (METAMUCIL) 58.6 % powder Take 1 packet by mouth daily. Reported on 08/27/2015    . ranitidine (ZANTAC) 75 MG tablet Take 75 mg by mouth 2 (two) times daily.    . rosuvastatin (CRESTOR) 5 MG tablet Take 5 mg by mouth every morning.     [name] SYNTHROID 112 MCG tablet Take 112 mcg by mouth daily.    . valsartan-hydrochlorothiazide (DIOVAN-HCT) 320-25 MG per tablet Take 1 tablet by mouth every morning.      No current facility-administered medications for this visit.     SURGICAL  HISTORY:  Past Surgical History:  Procedure Laterality Date  . COLONOSCOPY WITH PROPOFOL N/A 03/19/2013   Procedure: COLONOSCOPY WITH PROPOFOL;  Surgeon: Garlan Fair, MD;  Location: [facility];  Service: Endoscopy;  Laterality: N/A;  . Cystourethroscopy, [facility].  04/16/2010  . NASAL SINUS SURGERY  1987  . Right video-assisted thoracoscopy, right upper lobectomy and  01/23/2006  . The Olympus video bronchoscope was introduced via the right  12/21/2005  . TONSILLECTOMY  1957  . Torn medial and lateral menisci, left knee.  11/06/2006    REVIEW OF SYSTEMS:  A comprehensive review of systems was negative except for: Integument/breast: positive for pruritus and rash   PHYSICAL EXAMINATION: General appearance: alert, cooperative and no distress Head: Normocephalic, without obvious abnormality, atraumatic Neck: no adenopathy, no JVD, supple, symmetrical, trachea midline and thyroid not enlarged, symmetric, no tenderness/mass/nodules Lymph nodes: Cervical, supraclavicular, and axillary nodes normal. Resp: clear to auscultation bilaterally Back: symmetric, no curvature. ROM normal. No CVA tenderness. Cardio: regular rate and rhythm, S1, S2 normal, no murmur, click, rub or gallop GI: soft, non-tender; bowel sounds normal; no masses,  no organomegaly Extremities: Skin rash at the skin folds in the arms and legs.  ECOG PERFORMANCE STATUS: 1 - Symptomatic but completely ambulatory  Blood pressure 116/78, pulse 87, temperature 97.6 F (36.4 C), temperature source Oral, resp. rate 17, height 6' (1.829 m), weight 241 lb 3.2 oz (109.4 kg), SpO2 100 %.  LABORATORY DATA: Lab Results  Component Value Date   WBC 7.9 05/19/2016   HGB 13.6 05/19/2016   HCT 40.2 05/19/2016   MCV 97.3 05/19/2016   PLT 249 05/19/2016      Chemistry      Component Value Date/Time   NA 142 05/19/2016 0940   K 3.9 05/19/2016 0940   CL 98 (L) 05/12/2016 1602   CL 102 04/24/2012 0853   CO2 29 05/19/2016  0940   BUN 19.7 05/19/2016 0940   CREATININE 1.2 05/19/2016 0940      Component Value Date/Time   CALCIUM 10.1 05/19/2016 0940   ALKPHOS 57 05/19/2016 0940   AST 20 05/19/2016 0940   ALT 27 05/19/2016 0940   BILITOT 0.51 05/19/2016 0940       RADIOGRAPHIC STUDIES: Ct Head Wo Contrast  Result Date: 05/12/2016 CLINICAL DATA:  Numbness in LEFT arm and shoulder since yesterday, history hypertension, lung cancer EXAM: CT HEAD WITHOUT CONTRAST TECHNIQUE: Contiguous axial images were obtained from the base of the skull through the vertex without intravenous contrast. Sagittal and coronal MPR images reconstructed from axial data set. COMPARISON:  03/08/2006 FINDINGS: Brain: Mild age-related atrophy. Normal ventricular morphology. No midline shift  or mass effect. Minimal small vessel chronic ischemic changes of deep cerebral white matter. No intracranial hemorrhage, mass lesion or evidence acute infarction. No extra-axial fluid collections. Vascular: New unremarkable Skull: Intact bowel focal abnormality Sinuses/Orbits: Clear Other: N/A IMPRESSION: Atrophy with minimal small vessel chronic ischemic changes of deep cerebral white matter. No acute intracranial abnormalities. Electronically Signed   By: Lavonia Dana M.D.   On: 05/12/2016 16:32   Mr [name] And Wo Contrast  Result Date: 05/12/2016 CLINICAL DATA:  71 y/o M; numbness in the left arm and shoulder starting yesterday. History of lung cancer. EXAM: MRI HEAD WITHOUT AND WITH CONTRAST MRI CERVICAL SPINE WITHOUT AND WITH CONTRAST TECHNIQUE: Multiplanar, multiecho pulse sequences of the brain and surrounding structures, and cervical spine, to include the craniocervical junction and cervicothoracic junction, were obtained without and with intravenous contrast. CONTRAST:  44m MULTIHANCE GADOBENATE DIMEGLUMINE 529 MG/ML IV SOLN COMPARISON:  05/12/2016 CT head FINDINGS: MRI HEAD FINDINGS Brain: No acute infarction, hemorrhage, hydrocephalus, extra-axial  collection or mass lesion. No abnormal enhancement of the brain. There are a few nonspecific foci of T2 FLAIR hyperintense signal abnormality in subcortical and periventricular white matter compatible with mild chronic microvascular ischemic changes and mild brain parenchymal volume loss. Vascular: Normal flow voids. Skull and upper cervical spine: Normal marrow signal. Sinuses/Orbits: Mild paranasal sinus mucosal thickening and small right maxillary sinus mucous retention cyst. No abnormal signal of the mastoid air cells. Orbits are unremarkable. Other: None. MRI CERVICAL SPINE FINDINGS Alignment: Straightening of cervical lordosis.  No listhesis. Vertebrae: Mild edema and enhancement at the left C3-4 facet joint is likely degenerative. No evidence for discitis, acute fracture, or suspicious osseous lesion. Cord: No abnormal cord signal or cord enhancement. Posterior Fossa, vertebral arteries, paraspinal tissues: Negative. Disc levels: C2-3: Uncovertebral and facet hypertrophy greater on the right with mild left and moderate right foraminal narrowing. No significant canal stenosis. C3-4: Disc osteophyte complex eccentric to the right and bilateral uncovertebral and facet hypertrophy. Mild-to-moderate bilateral foraminal narrowing. Mild canal stenosis. C4-5: Disc osteophyte complex and bilateral uncovertebral and facet hypertrophy. Moderate bilateral foraminal narrowing and moderate canal stenosis with slight anterior cord flattening. C5-6: Moderate disc osteophyte complex bilateral uncovertebral and facet hypertrophy. Moderate to severe bilateral foraminal narrowing and severe canal stenosis with cord flattening. C6-7: Disc osteophyte complex and central disc protrusion combined with bilateral uncovertebral and facet hypertrophy. Mild bilateral foraminal narrowing. Moderate to severe canal stenosis with anterior cord impingement and flattening by the disc protrusion. C7-T1: No significant disc displacement,  foraminal narrowing, or canal stenosis. IMPRESSION: 1. No acute intracranial abnormality identified. No abnormal enhancement of the brain. 2. Mild chronic microvascular ischemic changes and parenchymal volume loss of the brain. 3. Mild paranasal sinus disease. 4. No acute osseous abnormality or abnormal cord signal identified. 5. Advanced cervical spondylosis with discogenic and facet degenerative changes most pronounced at the C5 through C7 levels. 6. Multiple levels of canal stenosis moderate at C4-5, severe at C5-6, and moderate to severe at C6-7. At C5-6 and C6-7 there is anterior cord impingement and flattening. 7. Multiple levels of mild-to-moderate foraminal narrowing bilaterally and moderate to severe foraminal narrowing at the C5-6 level. Electronically Signed   By: LKristine GarbeM.D.   On: 05/12/2016 23:15   Mr Cervical Spine W Or Wo Contrast  Result Date: 05/12/2016 CLINICAL DATA:  71y/o M; numbness in the left arm and shoulder starting yesterday. History of lung cancer. EXAM: MRI HEAD WITHOUT AND WITH CONTRAST MRI CERVICAL SPINE  WITHOUT AND WITH CONTRAST TECHNIQUE: Multiplanar, multiecho pulse sequences of the brain and surrounding structures, and cervical spine, to include the craniocervical junction and cervicothoracic junction, were obtained without and with intravenous contrast. CONTRAST:  20mL MULTIHANCE GADOBENATE DIMEGLUMINE 529 MG/ML IV SOLN COMPARISON:  05/12/2016 CT head FINDINGS: MRI HEAD FINDINGS Brain: No acute infarction, hemorrhage, hydrocephalus, extra-axial collection or mass lesion. No abnormal enhancement of the brain. There are a few nonspecific foci of T2 FLAIR hyperintense signal abnormality in subcortical and periventricular white matter compatible with mild chronic microvascular ischemic changes and mild brain parenchymal volume loss. Vascular: Normal flow voids. Skull and upper cervical spine: Normal marrow signal. Sinuses/Orbits: Mild paranasal sinus mucosal  thickening and small right maxillary sinus mucous retention cyst. No abnormal signal of the mastoid air cells. Orbits are unremarkable. Other: None. MRI CERVICAL SPINE FINDINGS Alignment: Straightening of cervical lordosis.  No listhesis. Vertebrae: Mild edema and enhancement at the left C3-4 facet joint is likely degenerative. No evidence for discitis, acute fracture, or suspicious osseous lesion. Cord: No abnormal cord signal or cord enhancement. Posterior Fossa, vertebral arteries, paraspinal tissues: Negative. Disc levels: C2-3: Uncovertebral and facet hypertrophy greater on the right with mild left and moderate right foraminal narrowing. No significant canal stenosis. C3-4: Disc osteophyte complex eccentric to the right and bilateral uncovertebral and facet hypertrophy. Mild-to-moderate bilateral foraminal narrowing. Mild canal stenosis. C4-5: Disc osteophyte complex and bilateral uncovertebral and facet hypertrophy. Moderate bilateral foraminal narrowing and moderate canal stenosis with slight anterior cord flattening. C5-6: Moderate disc osteophyte complex bilateral uncovertebral and facet hypertrophy. Moderate to severe bilateral foraminal narrowing and severe canal stenosis with cord flattening. C6-7: Disc osteophyte complex and central disc protrusion combined with bilateral uncovertebral and facet hypertrophy. Mild bilateral foraminal narrowing. Moderate to severe canal stenosis with anterior cord impingement and flattening by the disc protrusion. C7-T1: No significant disc displacement, foraminal narrowing, or canal stenosis. IMPRESSION: 1. No acute intracranial abnormality identified. No abnormal enhancement of the brain. 2. Mild chronic microvascular ischemic changes and parenchymal volume loss of the brain. 3. Mild paranasal sinus disease. 4. No acute osseous abnormality or abnormal cord signal identified. 5. Advanced cervical spondylosis with discogenic and facet degenerative changes most pronounced  at the C5 through C7 levels. 6. Multiple levels of canal stenosis moderate at C4-5, severe at C5-6, and moderate to severe at C6-7. At C5-6 and C6-7 there is anterior cord impingement and flattening. 7. Multiple levels of mild-to-moderate foraminal narrowing bilaterally and moderate to severe foraminal narrowing at the C5-6 level. Electronically Signed   By: Lance  Furusawa-Stratton M.D.   On: 05/12/2016 23:15    ASSESSMENT AND PLAN:  This is a very pleasant 70 years old white male with recurrent non-small cell lung cancer, adenocarcinoma with positive EGFR mutation in exon 21. He is currently on treatment with Tarceva 100 mg by mouth daily status post total of 23 months of treatment. He has been tolerating his treatment with Tarceva fairly well with no significant adverse effects. He developed a skin rash in the arms and legs but mainly at the skin folds of the elbow and knees. This is unlikely to be related to his treatment with Tarceva but most likely secondary to his previous treatment with steroids. I recommended for the patient to start taking Atarax 10 mg by mouth 3 times daily as needed. I also gave him prescription for Benadryl cream to be applied to the rash area. He was also advised to take Pepcid 20 mg by mouth 2 hours   after Tarceva and 10 hours before the next dose. He would come back for follow-up visit in one week for evaluation. He was advised to call immediately if he has any concerning symptoms in the interval. The patient voices understanding of current disease status and treatment options and is in agreement with the current care plan. All questions were answered. The patient knows to call the clinic with any problems, questions or concerns. We can certainly see the patient much sooner if necessary. I spent 10 minutes counseling the patient face to face. The total time spent in the appointment was 15 minutes.  Disclaimer: This note was dictated with voice recognition software.  Similar sounding words can inadvertently be transcribed and may not be corrected upon review.       

## 2016-06-08 ENCOUNTER — Encounter: Payer: Self-pay | Admitting: Oncology

## 2016-06-08 ENCOUNTER — Encounter: Payer: Medicare Other | Admitting: *Deleted

## 2016-06-08 ENCOUNTER — Encounter: Payer: Self-pay | Admitting: Internal Medicine

## 2016-06-08 ENCOUNTER — Ambulatory Visit (HOSPITAL_BASED_OUTPATIENT_CLINIC_OR_DEPARTMENT_OTHER): Payer: Medicare Other | Admitting: Internal Medicine

## 2016-06-08 ENCOUNTER — Other Ambulatory Visit (HOSPITAL_BASED_OUTPATIENT_CLINIC_OR_DEPARTMENT_OTHER): Payer: Medicare Other

## 2016-06-08 ENCOUNTER — Other Ambulatory Visit (HOSPITAL_COMMUNITY)
Admission: AD | Admit: 2016-06-08 | Discharge: 2016-06-08 | Disposition: A | Discharge status: Home / Self Care | Payer: Medicare Other | Source: Ambulatory Visit | Attending: Internal Medicine | Admitting: Internal Medicine

## 2016-06-08 VITALS — BP 118/77 | HR 78 | Temp 97.8°F | Resp 17 | Wt 240.6 lb

## 2016-06-08 DIAGNOSIS — C3431 Malignant neoplasm of lower lobe, right bronchus or lung: Secondary | ICD-10-CM | POA: Insufficient documentation

## 2016-06-08 DIAGNOSIS — Z5111 Encounter for antineoplastic chemotherapy: Secondary | ICD-10-CM

## 2016-06-08 DIAGNOSIS — Z006 Encounter for examination for normal comparison and control in clinical research program: Secondary | ICD-10-CM

## 2016-06-08 DIAGNOSIS — Z79899 Other long term (current) drug therapy: Secondary | ICD-10-CM

## 2016-06-08 LAB — CBC WITH DIFFERENTIAL/PLATELET
BASO%: 1 % (ref 0.0–2.0)
BASOS ABS: 0.1 10*3/uL (ref 0.0–0.1)
EOS ABS: 0.3 10*3/uL (ref 0.0–0.5)
EOS%: 6.1 % (ref 0.0–7.0)
HCT: 37.4 % — ABNORMAL LOW (ref 38.4–49.9)
HEMOGLOBIN: 12.6 g/dL — AB (ref 13.0–17.1)
LYMPH%: 37.6 % (ref 14.0–49.0)
MCH: 32.9 pg (ref 27.2–33.4)
MCHC: 33.8 g/dL (ref 32.0–36.0)
MCV: 97.3 fL (ref 79.3–98.0)
MONO#: 0.6 10*3/uL (ref 0.1–0.9)
MONO%: 12 % (ref 0.0–14.0)
NEUT#: 2.1 10*3/uL (ref 1.5–6.5)
NEUT%: 43.3 % (ref 39.0–75.0)
Platelets: 229 10*3/uL (ref 140–400)
RBC: 3.84 10*6/uL — ABNORMAL LOW (ref 4.20–5.82)
RDW: 14.7 % — AB (ref 11.0–14.6)
WBC: 4.9 10*3/uL (ref 4.0–10.3)
lymph#: 1.9 10*3/uL (ref 0.9–3.3)

## 2016-06-08 LAB — COMPREHENSIVE METABOLIC PANEL
ALBUMIN: 4 g/dL (ref 3.5–5.0)
ALK PHOS: 60 U/L (ref 40–150)
ALT: 33 U/L (ref 0–55)
AST: 27 U/L (ref 5–34)
Anion Gap: 6 mEq/L (ref 3–11)
BUN: 17.7 mg/dL (ref 7.0–26.0)
CO2: 28 mEq/L (ref 22–29)
Calcium: 9.4 mg/dL (ref 8.4–10.4)
Chloride: 106 mEq/L (ref 98–109)
Creatinine: 1.1 mg/dL (ref 0.7–1.3)
EGFR: 69 mL/min/{1.73_m2} — AB (ref >90)
GLUCOSE: 87 mg/dL (ref 70–140)
POTASSIUM: 3.6 meq/L (ref 3.5–5.1)
SODIUM: 140 meq/L (ref 136–145)
Total Bilirubin: 0.38 mg/dL (ref 0.20–1.20)
Total Protein: 6.6 g/dL (ref 6.4–8.3)

## 2016-06-08 LAB — TSH: TSH: 3.39 m[IU]/L (ref 0.320–4.118)

## 2016-06-08 LAB — MAGNESIUM: Magnesium: 2.4 mg/dl (ref 1.5–2.5)

## 2016-06-08 LAB — LACTATE DEHYDROGENASE: LDH: 204 U/L (ref 125–245)

## 2016-06-08 LAB — RESEARCH LABS

## 2016-06-08 NOTE — Progress Notes (Signed)
[facility] [phone]   [phone]  OFFICE PROGRESS NOTE  [name],[name], MD 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  PRIOR THERAPY: 1) Status post right upper lobectomy under the care of Dr. Roxan Hockey on 01/23/2006.  2) Tarceva 150 mg by mouth daily as part of the BMS checkmate 370 clinical trial. Status post 6 weeks of treatment.  CURRENT THERAPY: Tarceva 100 mg by mouth daily as part of the BMS Checkmate 370 clinical trial. First dose started 07/17/2014 status post 24 months of treatment.  INTERVAL HISTORY: [name] 71 y.o. male came to the clinic today for follow-up visit accompanied by his wife. The patient is feeling much better today. His skin rash has significantly improved and most likely was secondary to his treatment with steroids for the shoulder. He denied having any current skin rash or diarrhea. He denied having any weight loss or night sweats. He has no nausea, vomiting, diarrhea or constipation. He has no significant weight loss or night sweats. The patient denied having any chest pain, shortness of breath, cough or hemoptysis. He is here today for evaluation and repeat blood work.  MEDICAL HISTORY: Past Medical History:  Diagnosis Date  . Cholelithiases 07/16/2015  . Complication of anesthesia 2007   quit breathing with bronchoscopy, had to spend night  . History of agent Orange exposure 44-45 yrs ago  . History of pneumonia  last 3-4 yrs ago   3 -4 different times  . Hypercholesterolemia   . Hypertension   . Hypothyroidism   . lung ca dx'd 2007   lung right  . Sinus disease    hx of  . Sleep apnea    uses cpap setting opf 12    ALLERGIES:  is allergic to lisinopril.  MEDICATIONS:  Current Outpatient Prescriptions    Medication Sig Dispense Refill  . amitriptyline (ELAVIL) 50 MG tablet Take 50 mg by mouth at bedtime. Reported on 06/18/2015    . aspirin 81 MG tablet Take 81 mg by mouth every morning.     . Cholecalciferol 2000 units CAPS Take 2,000 Units by mouth daily.     . clindamycin (CLEOCIN-T) 1 % external solution Apply 1 application topically 2 (two) times daily as needed (for skin irritation). 240 mL 0  . clindamycin (CLINDAGEL) 1 % gel Apply topically 2 (two) times daily. 30 g 0  . diltiazem (CARDIZEM CD) 360 MG 24 hr capsule Take 360 mg by mouth every morning.     . diphenhydrAMINE-zinc acetate (BENADRYL) cream Apply topically 3 (three) times daily as needed for itching. 28.3 g 0  . erlotinib (TARCEVA) 100 MG tablet Take 1 tablet (100 mg total) by mouth daily. Take on an empty stomach 1 hour before meals or 2 hours after.    . hydrOXYzine (ATARAX/VISTARIL) 10 MG tablet Take 1 tablet (10 mg total) by mouth 3 (three) times daily as needed. 30 tablet 0  . levETIRAcetam (KEPPRA) 750 MG tablet Take 750 mg by mouth at bedtime.     . metoCLOPramide (REGLAN) 10 MG tablet Take 10 mg by mouth every 6 (six) hours as needed for nausea.     . metoprolol succinate (TOPROL-XL) 25 MG 24 hr tablet Take 25 mg by mouth every morning.     . Multiple Vitamins-Minerals (  CENTRUM SILVER PO) Take 1 tablet by mouth daily.     . naproxen sodium (ANAPROX) 550 MG tablet Take 550 mg by mouth 3 (three) times daily as needed for headache (or pain). Reported on 08/27/2015    . polyethylene glycol (MIRALAX / GLYCOLAX) packet Take 17 g by mouth daily. Reported on 08/27/2015    . psyllium (METAMUCIL) 58.6 % powder Take 1 packet by mouth daily. Reported on 08/27/2015    . ranitidine (ZANTAC) 75 MG tablet Take 75 mg by mouth 2 (two) times daily.    . rosuvastatin (CRESTOR) 5 MG tablet Take 5 mg by mouth every morning.     [name] SYNTHROID 112 MCG tablet Take 112 mcg by mouth daily.    . valsartan-hydrochlorothiazide (DIOVAN-HCT) 320-25 MG per  tablet Take 1 tablet by mouth every morning.     . nitroGLYCERIN (NITROSTAT) 0.4 MG SL tablet Place 0.4 mg under the tongue every 5 (five) minutes as needed for chest pain. Reported on 10/22/2015     No current facility-administered medications for this visit.     SURGICAL HISTORY:  Past Surgical History:  Procedure Laterality Date  . COLONOSCOPY WITH PROPOFOL N/A 03/19/2013   Procedure: COLONOSCOPY WITH PROPOFOL;  Surgeon: Garlan Fair, MD;  Location: [facility];  Service: Endoscopy;  Laterality: N/A;  . Cystourethroscopy, [facility].  04/16/2010  . NASAL SINUS SURGERY  1987  . Right video-assisted thoracoscopy, right upper lobectomy and  01/23/2006  . The Olympus video bronchoscope was introduced via the right  12/21/2005  . TONSILLECTOMY  1957  . Torn medial and lateral menisci, left knee.  11/06/2006    REVIEW OF SYSTEMS:  A comprehensive review of systems was negative.   PHYSICAL EXAMINATION: General appearance: alert, cooperative and no distress Head: Normocephalic, without obvious abnormality, atraumatic Neck: no adenopathy, no JVD, supple, symmetrical, trachea midline and thyroid not enlarged, symmetric, no tenderness/mass/nodules Lymph nodes: Cervical, supraclavicular, and axillary nodes normal. Resp: clear to auscultation bilaterally Back: symmetric, no curvature. ROM normal. No CVA tenderness. Cardio: regular rate and rhythm, S1, S2 normal, no murmur, click, rub or gallop GI: soft, non-tender; bowel sounds normal; no masses,  no organomegaly Extremities: extremities normal, atraumatic, no cyanosis or edema  ECOG PERFORMANCE STATUS: 0 - Asymptomatic  Blood pressure 118/77, pulse 78, temperature 97.8 F (36.6 C), temperature source Oral, resp. rate 17, weight 240 lb 9.6 oz (109.1 kg), SpO2 97 %.  LABORATORY DATA: Lab Results  Component Value Date   WBC 4.9 06/08/2016   HGB 12.6 (L) 06/08/2016   HCT 37.4 (L) 06/08/2016   MCV 97.3 06/08/2016   PLT 229 06/08/2016       Chemistry      Component Value Date/Time   NA 142 05/19/2016 0940   K 3.9 05/19/2016 0940   CL 98 (L) 05/12/2016 1602   CL 102 04/24/2012 0853   CO2 29 05/19/2016 0940   BUN 19.7 05/19/2016 0940   CREATININE 1.2 05/19/2016 0940      Component Value Date/Time   CALCIUM 10.1 05/19/2016 0940   ALKPHOS 57 05/19/2016 0940   AST 20 05/19/2016 0940   ALT 27 05/19/2016 0940   BILITOT 0.51 05/19/2016 0940       RADIOGRAPHIC STUDIES: Ct Head Wo Contrast  Result Date: 05/12/2016 CLINICAL DATA:  Numbness in LEFT arm and shoulder since yesterday, history hypertension, lung cancer EXAM: CT HEAD WITHOUT CONTRAST TECHNIQUE: Contiguous axial images were obtained from the base of the skull through the vertex without intravenous  contrast. Sagittal and coronal MPR images reconstructed from axial data set. COMPARISON:  03/08/2006 FINDINGS: Brain: Mild age-related atrophy. Normal ventricular morphology. No midline shift or mass effect. Minimal small vessel chronic ischemic changes of deep cerebral white matter. No intracranial hemorrhage, mass lesion or evidence acute infarction. No extra-axial fluid collections. Vascular: New unremarkable Skull: Intact bowel focal abnormality Sinuses/Orbits: Clear Other: N/A IMPRESSION: Atrophy with minimal small vessel chronic ischemic changes of deep cerebral white matter. No acute intracranial abnormalities. Electronically Signed   By: Lavonia Dana M.D.   On: 05/12/2016 16:32   Mr Jeri Cos And Wo Contrast  Result Date: 05/12/2016 CLINICAL DATA:  71 y/o M; numbness in the left arm and shoulder starting yesterday. History of lung cancer. EXAM: MRI HEAD WITHOUT AND WITH CONTRAST MRI CERVICAL SPINE WITHOUT AND WITH CONTRAST TECHNIQUE: Multiplanar, multiecho pulse sequences of the brain and surrounding structures, and cervical spine, to include the craniocervical junction and cervicothoracic junction, were obtained without and with intravenous contrast. CONTRAST:  70m  MULTIHANCE GADOBENATE DIMEGLUMINE 529 MG/ML IV SOLN COMPARISON:  05/12/2016 CT head FINDINGS: MRI HEAD FINDINGS Brain: No acute infarction, hemorrhage, hydrocephalus, extra-axial collection or mass lesion. No abnormal enhancement of the brain. There are a few nonspecific foci of T2 FLAIR hyperintense signal abnormality in subcortical and periventricular white matter compatible with mild chronic microvascular ischemic changes and mild brain parenchymal volume loss. Vascular: Normal flow voids. Skull and upper cervical spine: Normal marrow signal. Sinuses/Orbits: Mild paranasal sinus mucosal thickening and small right maxillary sinus mucous retention cyst. No abnormal signal of the mastoid air cells. Orbits are unremarkable. Other: None. MRI CERVICAL SPINE FINDINGS Alignment: Straightening of cervical lordosis.  No listhesis. Vertebrae: Mild edema and enhancement at the left C3-4 facet joint is likely degenerative. No evidence for discitis, acute fracture, or suspicious osseous lesion. Cord: No abnormal cord signal or cord enhancement. Posterior Fossa, vertebral arteries, paraspinal tissues: Negative. Disc levels: C2-3: Uncovertebral and facet hypertrophy greater on the right with mild left and moderate right foraminal narrowing. No significant canal stenosis. C3-4: Disc osteophyte complex eccentric to the right and bilateral uncovertebral and facet hypertrophy. Mild-to-moderate bilateral foraminal narrowing. Mild canal stenosis. C4-5: Disc osteophyte complex and bilateral uncovertebral and facet hypertrophy. Moderate bilateral foraminal narrowing and moderate canal stenosis with slight anterior cord flattening. C5-6: Moderate disc osteophyte complex bilateral uncovertebral and facet hypertrophy. Moderate to severe bilateral foraminal narrowing and severe canal stenosis with cord flattening. C6-7: Disc osteophyte complex and central disc protrusion combined with bilateral uncovertebral and facet hypertrophy. Mild  bilateral foraminal narrowing. Moderate to severe canal stenosis with anterior cord impingement and flattening by the disc protrusion. C7-T1: No significant disc displacement, foraminal narrowing, or canal stenosis. IMPRESSION: 1. No acute intracranial abnormality identified. No abnormal enhancement of the brain. 2. Mild chronic microvascular ischemic changes and parenchymal volume loss of the brain. 3. Mild paranasal sinus disease. 4. No acute osseous abnormality or abnormal cord signal identified. 5. Advanced cervical spondylosis with discogenic and facet degenerative changes most pronounced at the C5 through C7 levels. 6. Multiple levels of canal stenosis moderate at C4-5, severe at C5-6, and moderate to severe at C6-7. At C5-6 and C6-7 there is anterior cord impingement and flattening. 7. Multiple levels of mild-to-moderate foraminal narrowing bilaterally and moderate to severe foraminal narrowing at the C5-6 level. Electronically Signed   By: LKristine GarbeM.D.   On: 05/12/2016 23:15   Mr Cervical Spine W Or Wo Contrast  Result Date: 05/12/2016 CLINICAL DATA:  71  y/o M; numbness in the left arm and shoulder starting yesterday. History of lung cancer. EXAM: MRI HEAD WITHOUT AND WITH CONTRAST MRI CERVICAL SPINE WITHOUT AND WITH CONTRAST TECHNIQUE: Multiplanar, multiecho pulse sequences of the brain and surrounding structures, and cervical spine, to include the craniocervical junction and cervicothoracic junction, were obtained without and with intravenous contrast. CONTRAST:  82m MULTIHANCE GADOBENATE DIMEGLUMINE 529 MG/ML IV SOLN COMPARISON:  05/12/2016 CT head FINDINGS: MRI HEAD FINDINGS Brain: No acute infarction, hemorrhage, hydrocephalus, extra-axial collection or mass lesion. No abnormal enhancement of the brain. There are a few nonspecific foci of T2 FLAIR hyperintense signal abnormality in subcortical and periventricular white matter compatible with mild chronic microvascular ischemic  changes and mild brain parenchymal volume loss. Vascular: Normal flow voids. Skull and upper cervical spine: Normal marrow signal. Sinuses/Orbits: Mild paranasal sinus mucosal thickening and small right maxillary sinus mucous retention cyst. No abnormal signal of the mastoid air cells. Orbits are unremarkable. Other: None. MRI CERVICAL SPINE FINDINGS Alignment: Straightening of cervical lordosis.  No listhesis. Vertebrae: Mild edema and enhancement at the left C3-4 facet joint is likely degenerative. No evidence for discitis, acute fracture, or suspicious osseous lesion. Cord: No abnormal cord signal or cord enhancement. Posterior Fossa, vertebral arteries, paraspinal tissues: Negative. Disc levels: C2-3: Uncovertebral and facet hypertrophy greater on the right with mild left and moderate right foraminal narrowing. No significant canal stenosis. C3-4: Disc osteophyte complex eccentric to the right and bilateral uncovertebral and facet hypertrophy. Mild-to-moderate bilateral foraminal narrowing. Mild canal stenosis. C4-5: Disc osteophyte complex and bilateral uncovertebral and facet hypertrophy. Moderate bilateral foraminal narrowing and moderate canal stenosis with slight anterior cord flattening. C5-6: Moderate disc osteophyte complex bilateral uncovertebral and facet hypertrophy. Moderate to severe bilateral foraminal narrowing and severe canal stenosis with cord flattening. C6-7: Disc osteophyte complex and central disc protrusion combined with bilateral uncovertebral and facet hypertrophy. Mild bilateral foraminal narrowing. Moderate to severe canal stenosis with anterior cord impingement and flattening by the disc protrusion. C7-T1: No significant disc displacement, foraminal narrowing, or canal stenosis. IMPRESSION: 1. No acute intracranial abnormality identified. No abnormal enhancement of the brain. 2. Mild chronic microvascular ischemic changes and parenchymal volume loss of the brain. 3. Mild paranasal  sinus disease. 4. No acute osseous abnormality or abnormal cord signal identified. 5. Advanced cervical spondylosis with discogenic and facet degenerative changes most pronounced at the C5 through C7 levels. 6. Multiple levels of canal stenosis moderate at C4-5, severe at C5-6, and moderate to severe at C6-7. At C5-6 and C6-7 there is anterior cord impingement and flattening. 7. Multiple levels of mild-to-moderate foraminal narrowing bilaterally and moderate to severe foraminal narrowing at the C5-6 level. Electronically Signed   By: LKristine GarbeM.D.   On: 05/12/2016 23:15    ASSESSMENT AND PLAN:  This is a very pleasant 71years old white male with recurrent non-small cell lung cancer, adenocarcinoma with positive EGFR mutation in exon 21. He is currently on treatment with Tarceva 100 mg by mouth daily status post 24 months of treatment. The official completed the duration of treatment on the clinical trial. I recommended for the patient to continue his current treatment with Tarceva off  protocol . I will see him back for follow-up visit in one month's for evaluation after repeating CT scan of the chest, abdomen and pelvis for restaging of his disease.   The patient was advised to call immediately if he has any concerning symptoms in the interval. The patient voices understanding of current disease  status and treatment options and is in agreement with the current care plan. All questions were answered. The patient knows to call the clinic with any problems, questions or concerns. We can certainly see the patient much sooner if necessary. I spent 10 minutes counseling the patient face to face. The total time spent in the appointment was 15 minutes.   Disclaimer: This note was dictated with voice recognition software. Similar sounding words can inadvertently be transcribed and may not be corrected upon review.

## 2016-06-08 NOTE — Progress Notes (Signed)
06/08/16 - BMS [id number] study - Patient into the cancer center for routine visit.  Patient given PRO's (questionnaires) upon arrival to the cancer center.  Patient completed the PRO's and then went to the lab and had his EOT research labs drawn.  The patient was thanked for his continued support in this clinical trial. [name] 06/08/16 - 8:30 am

## 2016-06-08 NOTE — Progress Notes (Signed)
06/08/2016 Patient in to clinic today for End of Treatment evaluation following last dose of study treatment on 06/01/2016, cycle 52, day 14. Upon arrival to the clinic, patient-reported outcomes questionnaires were completed independently by the patient. Patient then proceeded to the lab for collection of routine and research blood samples. Vital signs were obtained at rest. Patient returned his own personal calendar for the month of February, documenting days dosed with Tarceva through final study treatment on 06/01/2016, showing no missed doses. Patient was seen by Dr. [name] on 06/01/2016 due to rash following treatment with steroids. He is continuing to take hydroxyzine and using topical medication as prescribed by Dr. [name].  Confirmed plans for FU visit 1 approximately 35 days from the last dose of study treatment. CT scans appointment for 07/05/16 is pending, and patient is already scheduled for lab and MD visits on 07/07/16. This new schedule will coincide with the standard of care assessments of every four weeks during oral therapy with CT scans every 12 weeks. Patient and MD are in agreement with this plan. The patient has had no hospitalizations or outpatient visits since the last healthcare utilization assessment on 05/06/15. He had an ED visit on 05/12/2016 and a subsequent visit with the neurosurgeon for follow-up, and visit with Dr. [name] as described above. He is not receiving home health care or any other type of non-protocol visits related to study therapy or disease. Cindy S. Brigitte Pulse BSN, RN, [place] 06/08/2016 2:02 PM  Adverse Event Log Study/Protocol: BMS [id number] Cycle 52 through EOT visit: 05/19/2016 - 06/08/2016 (end of cycle = 06/01/2016) Event Grade Onset Date Resolved Date Attribution to erlotinib Comments  Fatigue 2 08/14/14 ongoing Not related   Dyspnea on exertion 1 09/11/14 ongoing Not related Intermittent, stable  Nail infection - right first toe 1 11/17/14 ongoing Not related   Rash - head,  chest, neck, back (intermittent) 1 01/01/15 ongoing Related   Right flank pain, int. 2 11/2015 ongoing Not related Improved to grade 1  GE reflux, intermittent 2 03/07/16 ongoing Not related   Paresthesia, cervical spine 2 05/12/16 ongoing Not related   Rash - extremities 2 05/28/16 ongoing Not related Related to steroid tx  Itching 2 05/28/16 06/08/16 Not related Related to steroid tx  Anemia 1 06/08/16 ongoing Not related    Not clinically significant: 05/19/2016:  Creatinine clearance 87.8 (reference range LLN 90 mL/min) 06/08/2016:  Hct 37.4 (reference range LLN 38.4%)       RBC 3.84 (reference range 4.20-5.82 10e6/microliter)   RDW 14.7 (reference range 11.0-14.6%) Cindy S. Brigitte Pulse BSN, RN, [place] 06/23/2016 2:50 PM

## 2016-06-09 LAB — PHOSPHORUS: Phosphorus, Ser: 3.1 mg/dL (ref 2.5–4.5)

## 2016-06-23 ENCOUNTER — Other Ambulatory Visit: Payer: Self-pay | Admitting: Internal Medicine

## 2016-06-24 ENCOUNTER — Telehealth: Payer: Self-pay | Admitting: Medical Oncology

## 2016-06-24 DIAGNOSIS — C3431 Malignant neoplasm of lower lobe, right bronchus or lung: Secondary | ICD-10-CM

## 2016-06-24 MED ORDER — ERLOTINIB HCL 100 MG PO TABS: 100.0000 mg | ORAL_TABLET | Freq: Every day | ORAL | 2 refills | Status: DC

## 2016-06-24 MED FILL — TARCEVA 100 MG TABLET: 100 | 30 days supply | Qty: 30 | Fill #0

## 2016-06-24 NOTE — Telephone Encounter (Signed)
tarceva refilled 

## 2016-06-30 ENCOUNTER — Other Ambulatory Visit: Payer: Self-pay | Admitting: *Deleted

## 2016-06-30 DIAGNOSIS — C3431 Malignant neoplasm of lower lobe, right bronchus or lung: Secondary | ICD-10-CM

## 2016-07-04 ENCOUNTER — Other Ambulatory Visit: Payer: Medicare Other

## 2016-07-05 ENCOUNTER — Encounter (HOSPITAL_COMMUNITY): Payer: Self-pay

## 2016-07-05 ENCOUNTER — Ambulatory Visit (HOSPITAL_COMMUNITY)
Admission: RE | Admit: 2016-07-05 | Discharge: 2016-07-05 | Disposition: A | Discharge status: Home / Self Care | Payer: Medicare Other | Source: Ambulatory Visit | Attending: Internal Medicine | Admitting: Internal Medicine

## 2016-07-05 DIAGNOSIS — I700 Atherosclerosis of aorta: Secondary | ICD-10-CM | POA: Insufficient documentation

## 2016-07-05 DIAGNOSIS — K8020 Calculus of gallbladder without cholecystitis without obstruction: Secondary | ICD-10-CM | POA: Diagnosis not present

## 2016-07-05 DIAGNOSIS — C3431 Malignant neoplasm of lower lobe, right bronchus or lung: Secondary | ICD-10-CM | POA: Diagnosis not present

## 2016-07-05 DIAGNOSIS — R918 Other nonspecific abnormal finding of lung field: Secondary | ICD-10-CM | POA: Insufficient documentation

## 2016-07-05 DIAGNOSIS — C3411 Malignant neoplasm of upper lobe, right bronchus or lung: Secondary | ICD-10-CM | POA: Diagnosis not present

## 2016-07-05 MED ORDER — IOPAMIDOL (ISOVUE-300) INJECTION 61%
100.0000 mL | Freq: Once | INTRAVENOUS | Status: AC | PRN
  Administered 2016-07-05: 100 mL via INTRAVENOUS

## 2016-07-05 MED ORDER — IOPAMIDOL (ISOVUE-300) INJECTION 61%
INTRAVENOUS | Status: AC
  Filled 2016-07-05: qty 100

## 2016-07-07 ENCOUNTER — Encounter: Payer: Self-pay | Admitting: Internal Medicine

## 2016-07-07 ENCOUNTER — Encounter: Payer: Medicare Other | Admitting: *Deleted

## 2016-07-07 ENCOUNTER — Other Ambulatory Visit: Payer: Medicare Other

## 2016-07-07 ENCOUNTER — Other Ambulatory Visit (HOSPITAL_BASED_OUTPATIENT_CLINIC_OR_DEPARTMENT_OTHER): Payer: Medicare Other

## 2016-07-07 ENCOUNTER — Telehealth: Payer: Self-pay | Admitting: Internal Medicine

## 2016-07-07 ENCOUNTER — Ambulatory Visit (HOSPITAL_BASED_OUTPATIENT_CLINIC_OR_DEPARTMENT_OTHER): Payer: Medicare Other | Admitting: Internal Medicine

## 2016-07-07 VITALS — BP 115/78 | HR 62 | Temp 97.6°F | Resp 18 | Ht 72.0 in | Wt 241.6 lb

## 2016-07-07 DIAGNOSIS — C3431 Malignant neoplasm of lower lobe, right bronchus or lung: Secondary | ICD-10-CM | POA: Diagnosis not present

## 2016-07-07 DIAGNOSIS — Z006 Encounter for examination for normal comparison and control in clinical research program: Secondary | ICD-10-CM

## 2016-07-07 DIAGNOSIS — E039 Hypothyroidism, unspecified: Secondary | ICD-10-CM | POA: Diagnosis not present

## 2016-07-07 DIAGNOSIS — Z5111 Encounter for antineoplastic chemotherapy: Secondary | ICD-10-CM

## 2016-07-07 LAB — COMPREHENSIVE METABOLIC PANEL
ALBUMIN: 4 g/dL (ref 3.5–5.0)
ALK PHOS: 60 U/L (ref 40–150)
ALT: 24 U/L (ref 0–55)
AST: 24 U/L (ref 5–34)
Anion Gap: 7 mEq/L (ref 3–11)
BILIRUBIN TOTAL: 0.32 mg/dL (ref 0.20–1.20)
BUN: 16.7 mg/dL (ref 7.0–26.0)
CALCIUM: 9.7 mg/dL (ref 8.4–10.4)
CO2: 27 mEq/L (ref 22–29)
Chloride: 107 mEq/L (ref 98–109)
Creatinine: 1 mg/dL (ref 0.7–1.3)
EGFR: 75 mL/min/{1.73_m2} — ABNORMAL LOW (ref >90)
Glucose: 100 mg/dl (ref 70–140)
POTASSIUM: 4.2 meq/L (ref 3.5–5.1)
Sodium: 142 mEq/L (ref 136–145)
Total Protein: 6.6 g/dL (ref 6.4–8.3)

## 2016-07-07 LAB — CBC WITH DIFFERENTIAL/PLATELET
BASO%: 0.7 % (ref 0.0–2.0)
BASOS ABS: 0 10*3/uL (ref 0.0–0.1)
EOS%: 3.9 % (ref 0.0–7.0)
Eosinophils Absolute: 0.2 10*3/uL (ref 0.0–0.5)
HEMATOCRIT: 36.4 % — AB (ref 38.4–49.9)
HEMOGLOBIN: 12.5 g/dL — AB (ref 13.0–17.1)
LYMPH%: 36.9 % (ref 14.0–49.0)
MCH: 32.8 pg (ref 27.2–33.4)
MCHC: 34.2 g/dL (ref 32.0–36.0)
MCV: 95.9 fL (ref 79.3–98.0)
MONO#: 0.6 10*3/uL (ref 0.1–0.9)
MONO%: 11.8 % (ref 0.0–14.0)
NEUT#: 2.5 10*3/uL (ref 1.5–6.5)
NEUT%: 46.7 % (ref 39.0–75.0)
Platelets: 222 10*3/uL (ref 140–400)
RBC: 3.8 10*6/uL — ABNORMAL LOW (ref 4.20–5.82)
RDW: 14.2 % (ref 11.0–14.6)
WBC: 5.3 10*3/uL (ref 4.0–10.3)
lymph#: 2 10*3/uL (ref 0.9–3.3)

## 2016-07-07 LAB — LACTATE DEHYDROGENASE: LDH: 184 U/L (ref 125–245)

## 2016-07-07 LAB — MAGNESIUM: Magnesium: 2.6 mg/dl — ABNORMAL HIGH (ref 1.5–2.5)

## 2016-07-07 NOTE — Progress Notes (Signed)
07/07/2016 Patient in to clinic today for routine follow-up visit. Upon arrival to the clinic, patient-reported outcomes questionnaires were completed independently by the patient prior to blood sample collection. Vital signs were obtained at rest. History and physical exam, with review of 07/05/16 12-week CT scans, were performed by Dr. [name]. Plan is to continue with the standard of care assessments of every four weeks during oral therapy with CT scans every 12 weeks. Patient and MD are in agreement with this plan, and are aware that the next study visit, Follow-up visit 2 (X02) will occur in approximately 12 weeks, including CT scans.  Patient reports ongoing, mild, intermittent right flank pain. His extremity rash has resolved and while the paresthesia related to his cervical spine abnormality is improved, it is not completely gone.See AER log below for ongoing events. The patient has had no emergency department visits, physician visits, hospitalizations or outpatient visits since the last healthcare utilization assessment on 06/08/16. He is not receiving home health care or any other type of non-protocol visits related to study therapy or disease. Cindy S. Brigitte Pulse BSN, RN, [place] 07/07/2016 1:15 PM  Adverse Event Log Study/Protocol: BMS [id number] EOT visit through FU X01: 06/08/2016 - 07/07/2016 Event Grade Onset Date Resolved Date Attribution to erlotinib Comments  Fatigue 2 08/14/14 ongoing Not related   Dyspnea on exertion 1 09/11/14 ongoing Not related Intermittent, stable  Nail infection - right first toe 1 11/17/14 ongoing Not related   Rash - head, chest, neck, back (intermittent) 1 01/01/15 ongoing Related   Right flank pain, int. 2 11/2015 ongoing Not related Improved to grade 1  GE reflux, intermittent 2 03/07/16 ongoing Not related   Paresthesia, cervical spine 2 05/12/16 06/16/16 Not related Very mild in nature (grade 1).  Paresthesia, cervical spine 1 06/16/16 ongoing Not related Improved to grade 1.   Rash - extremities 2 05/28/16 06/11/2016 Not related Related to steroid tx  Anemia 1 06/08/16 ongoing Not related    Not clinically significant: 07/07/2016:         Hct 36.4 (reference range LLN 38.4%)                                                                       RBC 3.80 (reference range 4.20-5.82 10e6/microliter)                         Magnesium 2.6 mg/dL (reference range ULN = 2.5)    Cindy S. Brigitte Pulse BSN, RN, [place] 08/06/2016 9:43 AM

## 2016-07-07 NOTE — Telephone Encounter (Signed)
Gave patient AVS and calender per 07/07/2016 los .

## 2016-07-07 NOTE — Progress Notes (Signed)
[place] [phone]   [phone]  OFFICE PROGRESS NOTE  [name],[name], MD [address] 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  PRIOR THERAPY: 1) Status post right upper lobectomy under the care of Dr. Roxan Hockey on 01/23/2006.  2) Tarceva 150 mg by mouth daily as part of the BMS checkmate 370 clinical trial. Status post 6 weeks of treatment.  CURRENT THERAPY: Tarceva 100 mg by mouth daily as part of the BMS Checkmate 370 clinical trial. First dose started 07/17/2014 status post 24 months of treatment.  INTERVAL HISTORY: [name] 71 y.o. male returns to the clinic today for follow-up visit accompanied by his wife. The patient is currently undergoing treatment with Tarceva 100 mg by mouth daily status post 25 months of treatment and tolerating his treatment fairly well. He denied having any significant skin rash or diarrhea. He has no significant weight loss or night sweats. He has no nausea, vomiting, diarrhea or constipation. He has no significant weight loss or night sweats. The patient denied having any significant chest pain, shortness of breath, cough or hemoptysis. He had repeat CT scan of the chest, abdomen and pelvis performed recently and he is here for evaluation and discussion of his scan results.   MEDICAL HISTORY: Past Medical History:  Diagnosis Date  . Cholelithiases 07/16/2015  . Complication of anesthesia 2007   quit breathing with bronchoscopy, had to spend night  . History of agent Orange exposure 44-45 yrs ago  . History of pneumonia  last 3-4 yrs ago   3 -4 different times  . Hypercholesterolemia   . Hypertension   . Hypothyroidism   . lung ca dx'd 2007   lung right  . Sinus disease    hx of  . Sleep apnea    uses cpap setting  opf 12    ALLERGIES:  is allergic to lisinopril and prednisone.  MEDICATIONS:  Current Outpatient Prescriptions  Medication Sig Dispense Refill  . amitriptyline (ELAVIL) 50 MG tablet Take 50 mg by mouth at bedtime. Reported on 06/18/2015    . aspirin 81 MG tablet Take 81 mg by mouth every morning.     . Cholecalciferol 2000 units CAPS Take 2,000 Units by mouth daily.     . clindamycin (CLEOCIN-T) 1 % external solution Apply 1 application topically 2 (two) times daily as needed (for skin irritation). 240 mL 0  . clindamycin (CLINDAGEL) 1 % gel Apply topically 2 (two) times daily. 30 g 0  . diltiazem (CARDIZEM CD) 360 MG 24 hr capsule Take 360 mg by mouth every morning.     . erlotinib (TARCEVA) 100 MG tablet Take 1 tablet (100 mg total) by mouth daily. Take on an empty stomach 1 hour before meals or 2 hours after. 30 tablet 2  . levETIRAcetam (KEPPRA) 750 MG tablet Take 750 mg by mouth at bedtime.     . metoprolol succinate (TOPROL-XL) 25 MG 24 hr tablet Take 25 mg by mouth every morning.     . Multiple Vitamins-Minerals (CENTRUM SILVER PO) Take 1 tablet by mouth daily.     . ranitidine (ZANTAC) 75 MG tablet Take 75 mg by mouth 2 (two) times daily.    . rosuvastatin (CRESTOR) 5 MG tablet Take 5 mg by mouth every morning.     [name]  SYNTHROID 112 MCG tablet Take 112 mcg by mouth daily.    . valsartan-hydrochlorothiazide (DIOVAN-HCT) 320-25 MG per tablet Take 1 tablet by mouth every morning.     . metoCLOPramide (REGLAN) 10 MG tablet Take 10 mg by mouth every 6 (six) hours as needed for nausea.     . naproxen sodium (ANAPROX) 550 MG tablet Take 550 mg by mouth 3 (three) times daily as needed for headache (or pain). Reported on 08/27/2015    . nitroGLYCERIN (NITROSTAT) 0.4 MG SL tablet Place 0.4 mg under the tongue every 5 (five) minutes as needed for chest pain. Reported on 10/22/2015    . polyethylene glycol (MIRALAX / GLYCOLAX) packet Take 17 g by mouth daily. Reported on 08/27/2015    . psyllium  (METAMUCIL) 58.6 % powder Take 1 packet by mouth daily. Reported on 08/27/2015     No current facility-administered medications for this visit.     SURGICAL HISTORY:  Past Surgical History:  Procedure Laterality Date  . COLONOSCOPY WITH PROPOFOL N/A 03/19/2013   Procedure: COLONOSCOPY WITH PROPOFOL;  Surgeon: Garlan Fair, MD;  Location: [facility];  Service: Endoscopy;  Laterality: N/A;  . Cystourethroscopy, [facility] T[facility]P.  04/16/2010  . NASAL SINUS SURGERY  1987  . Right video-assisted thoracoscopy, right upper lobectomy and  01/23/2006  . The Olympus video bronchoscope was introduced via the right  12/21/2005  . TONSILLECTOMY  1957  . Torn medial and lateral menisci, left knee.  11/06/2006    REVIEW OF SYSTEMS:  Constitutional: negative Eyes: negative Ears, nose, mouth, throat, and face: negative Respiratory: negative Cardiovascular: negative Gastrointestinal: negative Genitourinary:negative Integument/breast: negative Hematologic/lymphatic: negative Musculoskeletal:negative Neurological: negative Behavioral/Psych: negative Endocrine: negative Allergic/Immunologic: negative   PHYSICAL EXAMINATION: General appearance: alert, cooperative and no distress Head: Normocephalic, without obvious abnormality, atraumatic Neck: no adenopathy, no JVD, supple, symmetrical, trachea midline and thyroid not enlarged, symmetric, no tenderness/mass/nodules Lymph nodes: Cervical, supraclavicular, and axillary nodes normal. Resp: clear to auscultation bilaterally Back: symmetric, no curvature. ROM normal. No CVA tenderness. Cardio: regular rate and rhythm, S1, S2 normal, no murmur, click, rub or gallop GI: soft, non-tender; bowel sounds normal; no masses,  no organomegaly Extremities: extremities normal, atraumatic, no cyanosis or edema Neurologic: Alert and oriented X 3, normal strength and tone. Normal symmetric reflexes. Normal coordination and gait  ECOG PERFORMANCE STATUS: 0 -  Asymptomatic  Blood pressure 115/78, pulse 62, temperature 97.6 F (36.4 C), temperature source Oral, resp. rate 18, height 6' (1.829 m), weight 241 lb 9.6 oz (109.6 kg), SpO2 97 %.  LABORATORY DATA: Lab Results  Component Value Date   WBC 5.3 07/07/2016   HGB 12.5 (L) 07/07/2016   HCT 36.4 (L) 07/07/2016   MCV 95.9 07/07/2016   PLT 222 07/07/2016      Chemistry      Component Value Date/Time   NA 142 07/07/2016 0826   K 4.2 07/07/2016 0826   CL 98 (L) 05/12/2016 1602   CL 102 04/24/2012 0853   CO2 27 07/07/2016 0826   BUN 16.7 07/07/2016 0826   CREATININE 1.0 07/07/2016 0826      Component Value Date/Time   CALCIUM 9.7 07/07/2016 0826   ALKPHOS 60 07/07/2016 0826   AST 24 07/07/2016 0826   ALT 24 07/07/2016 0826   BILITOT 0.32 07/07/2016 0826       RADIOGRAPHIC STUDIES: Ct Chest W Contrast  Result Date: 07/05/2016 CLINICAL DATA:  Lung cancer. Recurrent non-small cell status post right upper lobectomy. EXAM: CT CHEST, ABDOMEN, AND  PELVIS WITH CONTRAST TECHNIQUE: Multidetector CT imaging of the chest, abdomen and pelvis was performed following the standard protocol during bolus administration of intravenous contrast. CONTRAST:  123m ISOVUE-300 IOPAMIDOL (ISOVUE-300) INJECTION 61% COMPARISON:  04/14/2016 FINDINGS: RECIST 1.1 Target Lesions: 1. Right lower lobe pulmonary nodule (image 57) measures 15 x 15 mm today compared to 16 x 15 mm previously. 2. Right lower lobe pulmonary nodule (image 62) measures 6 x 17 mm today compared to 6 x 16 mm previously. Non-target Lesions: 1. Sub solid right anterior lower lobe nodule.  Not identified. 2. Right lower lobe pulmonary nodule (image 74) measures 7 mm today compared to 7 mm previously. CT CHEST FINDINGS Cardiovascular: The heart size is normal. No pericardial effusion. Atherosclerotic calcification is noted in the wall of the thoracic aorta. Mediastinum/Nodes: No mediastinal lymphadenopathy. There is no hilar lymphadenopathy. The  esophagus has normal imaging features. There is no axillary lymphadenopathy. Lungs/Pleura: Volume loss right hemithorax consistent with prior right upper lobectomy no new or progressive pulmonary nodule or mass. No focal airspace consolidation. No pulmonary edema or pleural effusion Musculoskeletal: Bone windows reveal no worrisome lytic or sclerotic osseous lesions. CT ABDOMEN PELVIS FINDINGS Hepatobiliary: No focal abnormality within the liver parenchyma. 2.9 cm calcified gallstone evident. No intrahepatic or extrahepatic biliary dilation. Pancreas: No focal mass lesion. No dilatation of the main duct. No intraparenchymal cyst. No peripancreatic edema. Spleen: No splenomegaly. No focal mass lesion. Adrenals/Urinary Tract: No adrenal nodule or mass. 17 mm exophytic cyst interpolar right kidney is stable. Left kidney unremarkable. No evidence for hydroureter. The urinary bladder appears normal for the degree of distention. Stomach/Bowel: Stomach is nondistended. No gastric wall thickening. No evidence of outlet obstruction. Duodenum is normally positioned as is the ligament of Treitz. No small bowel wall thickening. No small bowel dilatation. The terminal ileum is normal. The appendix is normal. No gross colonic mass. No colonic wall thickening. No substantial diverticular change. Vascular/Lymphatic: There is abdominal aortic atherosclerosis without aneurysm. There is no gastrohepatic or hepatoduodenal ligament lymphadenopathy. No intraperitoneal or retroperitoneal lymphadenopathy. No pelvic sidewall lymphadenopathy. Reproductive: Prostate gland is enlarged. Other: No intraperitoneal free fluid. Musculoskeletal: Bilateral groin hernias contain only fat. Bone windows reveal no worrisome lytic or sclerotic osseous lesions. IMPRESSION: 1. Stable exam.  No new or progressive findings. 2. Right-sided pulmonary nodules are unchanged. 3. Cholelithiasis. 4.  Abdominal Aortic Atherosclerois (ICD10-170.0) Electronically  Signed   By: EMisty StanleyM.D.   On: 07/05/2016 12:39   Ct Abdomen Pelvis W Contrast  Result Date: 07/05/2016 CLINICAL DATA:  Lung cancer. Recurrent non-small cell status post right upper lobectomy. EXAM: CT CHEST, ABDOMEN, AND PELVIS WITH CONTRAST TECHNIQUE: Multidetector CT imaging of the chest, abdomen and pelvis was performed following the standard protocol during bolus administration of intravenous contrast. CONTRAST:  1067mISOVUE-300 IOPAMIDOL (ISOVUE-300) INJECTION 61% COMPARISON:  04/14/2016 FINDINGS: RECIST 1.1 Target Lesions: 1. Right lower lobe pulmonary nodule (image 57) measures 15 x 15 mm today compared to 16 x 15 mm previously. 2. Right lower lobe pulmonary nodule (image 62) measures 6 x 17 mm today compared to 6 x 16 mm previously. Non-target Lesions: 1. Sub solid right anterior lower lobe nodule.  Not identified. 2. Right lower lobe pulmonary nodule (image 74) measures 7 mm today compared to 7 mm previously. CT CHEST FINDINGS Cardiovascular: The heart size is normal. No pericardial effusion. Atherosclerotic calcification is noted in the wall of the thoracic aorta. Mediastinum/Nodes: No mediastinal lymphadenopathy. There is no hilar lymphadenopathy. The esophagus has normal imaging  features. There is no axillary lymphadenopathy. Lungs/Pleura: Volume loss right hemithorax consistent with prior right upper lobectomy no new or progressive pulmonary nodule or mass. No focal airspace consolidation. No pulmonary edema or pleural effusion Musculoskeletal: Bone windows reveal no worrisome lytic or sclerotic osseous lesions. CT ABDOMEN PELVIS FINDINGS Hepatobiliary: No focal abnormality within the liver parenchyma. 2.9 cm calcified gallstone evident. No intrahepatic or extrahepatic biliary dilation. Pancreas: No focal mass lesion. No dilatation of the main duct. No intraparenchymal cyst. No peripancreatic edema. Spleen: No splenomegaly. No focal mass lesion. Adrenals/Urinary Tract: No adrenal nodule or  mass. 17 mm exophytic cyst interpolar right kidney is stable. Left kidney unremarkable. No evidence for hydroureter. The urinary bladder appears normal for the degree of distention. Stomach/Bowel: Stomach is nondistended. No gastric wall thickening. No evidence of outlet obstruction. Duodenum is normally positioned as is the ligament of Treitz. No small bowel wall thickening. No small bowel dilatation. The terminal ileum is normal. The appendix is normal. No gross colonic mass. No colonic wall thickening. No substantial diverticular change. Vascular/Lymphatic: There is abdominal aortic atherosclerosis without aneurysm. There is no gastrohepatic or hepatoduodenal ligament lymphadenopathy. No intraperitoneal or retroperitoneal lymphadenopathy. No pelvic sidewall lymphadenopathy. Reproductive: Prostate gland is enlarged. Other: No intraperitoneal free fluid. Musculoskeletal: Bilateral groin hernias contain only fat. Bone windows reveal no worrisome lytic or sclerotic osseous lesions. IMPRESSION: 1. Stable exam.  No new or progressive findings. 2. Right-sided pulmonary nodules are unchanged. 3. Cholelithiasis. 4.  Abdominal Aortic Atherosclerois (ICD10-170.0) Electronically Signed   By: Misty Stanley M.D.   On: 07/05/2016 12:39    ASSESSMENT AND PLAN:  This is a very pleasant 71 years old white male with recurrent non-small cell lung cancer, adenocarcinoma with positive EGFR mutation in exon 21. He is currently on treatment with Tarceva 100 mg by mouth daily status post 25 months. The patient is tolerating his treatment fairly well with no significant adverse effects. He had repeat CT scan of the chest, abdomen and pelvis performed recently. I personally and independently reviewed the scans and discuss the results with the patient and his wife today. History the scan showed no evidence for disease progression. I recommended for the patient to continue his current treatment with Tarceva at the same dose 100 mg by  mouth daily. I would see him back for follow-up visit in one month for evaluation and repeat blood work. For the hypothyroidism, he will continue his current treatment with levothyroxine. The patient was advised to call immediately if he has any concerning symptoms in the interval. The patient voices understanding of current disease status and treatment options and is in agreement with the current care plan. All questions were answered. The patient knows to call the clinic with any problems, questions or concerns. We can certainly see the patient much sooner if necessary.  Disclaimer: This note was dictated with voice recognition software. Similar sounding words can inadvertently be transcribed and may not be corrected upon review.

## 2016-07-08 LAB — PHOSPHORUS: Phosphorus, Ser: 3.1 mg/dL (ref 2.5–4.5)

## 2016-07-21 MED FILL — TARCEVA 100 MG TABLET: 100 | 30 days supply | Qty: 30 | Fill #1

## 2016-07-22 ENCOUNTER — Telehealth: Payer: Self-pay | Admitting: *Deleted

## 2016-07-22 NOTE — Telephone Encounter (Signed)
Pt called with request for recent labs to be faxed to [facility] at Dr. Inda Merlin office [phone].

## 2016-08-02 DIAGNOSIS — G43019 Migraine without aura, intractable, without status migrainosus: Secondary | ICD-10-CM | POA: Diagnosis not present

## 2016-08-02 DIAGNOSIS — G43111 Migraine with aura, intractable, with status migrainosus: Secondary | ICD-10-CM | POA: Diagnosis not present

## 2016-08-04 ENCOUNTER — Encounter: Payer: Self-pay | Admitting: Internal Medicine

## 2016-08-04 ENCOUNTER — Telehealth: Payer: Self-pay | Admitting: Internal Medicine

## 2016-08-04 ENCOUNTER — Ambulatory Visit (HOSPITAL_BASED_OUTPATIENT_CLINIC_OR_DEPARTMENT_OTHER): Payer: Medicare Other | Admitting: Internal Medicine

## 2016-08-04 ENCOUNTER — Other Ambulatory Visit (HOSPITAL_BASED_OUTPATIENT_CLINIC_OR_DEPARTMENT_OTHER): Payer: Medicare Other

## 2016-08-04 VITALS — BP 111/68 | HR 65 | Temp 97.8°F | Resp 22 | Ht 73.0 in | Wt 241.6 lb

## 2016-08-04 DIAGNOSIS — E039 Hypothyroidism, unspecified: Secondary | ICD-10-CM

## 2016-08-04 DIAGNOSIS — Z5111 Encounter for antineoplastic chemotherapy: Secondary | ICD-10-CM

## 2016-08-04 DIAGNOSIS — C3431 Malignant neoplasm of lower lobe, right bronchus or lung: Secondary | ICD-10-CM | POA: Diagnosis not present

## 2016-08-04 LAB — CBC WITH DIFFERENTIAL/PLATELET
BASO%: 0.5 % (ref 0.0–2.0)
Basophils Absolute: 0 10*3/uL (ref 0.0–0.1)
EOS%: 4 % (ref 0.0–7.0)
Eosinophils Absolute: 0.2 10*3/uL (ref 0.0–0.5)
HCT: 37.3 % — ABNORMAL LOW (ref 38.4–49.9)
HGB: 12.4 g/dL — ABNORMAL LOW (ref 13.0–17.1)
LYMPH%: 41.3 % (ref 14.0–49.0)
MCH: 31.6 pg (ref 27.2–33.4)
MCHC: 33.2 g/dL (ref 32.0–36.0)
MCV: 94.9 fL (ref 79.3–98.0)
MONO#: 0.7 10*3/uL (ref 0.1–0.9)
MONO%: 12.6 % (ref 0.0–14.0)
NEUT%: 41.6 % (ref 39.0–75.0)
NEUTROS ABS: 2.3 10*3/uL (ref 1.5–6.5)
PLATELETS: 207 10*3/uL (ref 140–400)
RBC: 3.93 10*6/uL — AB (ref 4.20–5.82)
RDW: 13.5 % (ref 11.0–14.6)
WBC: 5.5 10*3/uL (ref 4.0–10.3)
lymph#: 2.3 10*3/uL (ref 0.9–3.3)

## 2016-08-04 LAB — COMPREHENSIVE METABOLIC PANEL
ALT: 28 U/L (ref 0–55)
ANION GAP: 9 meq/L (ref 3–11)
AST: 26 U/L (ref 5–34)
Albumin: 4.2 g/dL (ref 3.5–5.0)
Alkaline Phosphatase: 57 U/L (ref 40–150)
BUN: 17.7 mg/dL (ref 7.0–26.0)
CO2: 27 meq/L (ref 22–29)
CREATININE: 1.1 mg/dL (ref 0.7–1.3)
Calcium: 9.4 mg/dL (ref 8.4–10.4)
Chloride: 104 mEq/L (ref 98–109)
EGFR: 68 mL/min/{1.73_m2} — ABNORMAL LOW (ref >90)
GLUCOSE: 99 mg/dL (ref 70–140)
Potassium: 3.7 mEq/L (ref 3.5–5.1)
SODIUM: 140 meq/L (ref 136–145)
Total Bilirubin: 0.49 mg/dL (ref 0.20–1.20)
Total Protein: 7 g/dL (ref 6.4–8.3)

## 2016-08-04 NOTE — Progress Notes (Signed)
[facility] Telephone:(336) [phone]   Fax:(336) [phone]  OFFICE PROGRESS NOTE  [name],[name], MD 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  PRIOR THERAPY: 1) Status post right upper lobectomy under the care of Dr. Roxan Hockey on 01/23/2006.  2) Tarceva 150 mg by mouth daily as part of the BMS checkmate 370 clinical trial. Status post 6 weeks of treatment.  CURRENT THERAPY: Tarceva 100 mg by mouth daily as part of the BMS Checkmate 370 clinical trial. First dose started 07/17/2014 status post 26 months of treatment.  INTERVAL HISTORY: [name] 71 y.o. male returns to the clinic today for follow-up visit accompanied by his wife. The patient has no complaints today. He is doing fine. He denied having any chest pain, shortness of breath, cough or hemoptysis. He has no significant weight loss or night sweats. He has no nausea, vomiting, diarrhea or constipation. He has no fever or chills. He had repeat CBC and comprehensive metabolic panel performed earlier today and is here for evaluation and discussion of his lab results.  MEDICAL HISTORY: Past Medical History:  Diagnosis Date  . Cholelithiases 07/16/2015  . Complication of anesthesia 2007   quit breathing with bronchoscopy, had to spend night  . History of agent Orange exposure 44-45 yrs ago  . History of pneumonia  last 3-4 yrs ago   3 -4 different times  . Hypercholesterolemia   . Hypertension   . Hypothyroidism   . lung ca dx'd 2007   lung right  . Sinus disease    hx of  . Sleep apnea    uses cpap setting opf 12    ALLERGIES:  is allergic to lisinopril and prednisone.  MEDICATIONS:  Current Outpatient Prescriptions  Medication Sig Dispense Refill  . amitriptyline (ELAVIL) 50 MG tablet Take 50 mg by  mouth at bedtime. Reported on 06/18/2015    . aspirin 81 MG tablet Take 81 mg by mouth every morning.     . Cholecalciferol 2000 units CAPS Take 2,000 Units by mouth daily.     . clindamycin (CLEOCIN-T) 1 % external solution Apply 1 application topically 2 (two) times daily as needed (for skin irritation). 240 mL 0  . clindamycin (CLINDAGEL) 1 % gel Apply topically 2 (two) times daily. 30 g 0  . diltiazem (CARDIZEM CD) 360 MG 24 hr capsule Take 360 mg by mouth every morning.     . erlotinib (TARCEVA) 100 MG tablet Take 1 tablet (100 mg total) by mouth daily. Take on an empty stomach 1 hour before meals or 2 hours after. 30 tablet 2  . levETIRAcetam (KEPPRA) 750 MG tablet Take 750 mg by mouth at bedtime.     . metoCLOPramide (REGLAN) 10 MG tablet Take 10 mg by mouth every 6 (six) hours as needed for nausea.     . metoprolol succinate (TOPROL-XL) 25 MG 24 hr tablet Take 25 mg by mouth every morning.     . Multiple Vitamins-Minerals (CENTRUM SILVER PO) Take 1 tablet by mouth daily.     . naproxen sodium (ANAPROX) 550 MG tablet Take 550 mg by mouth 3 (three) times daily as needed for headache (or pain). Reported on 08/27/2015    . nitroGLYCERIN (NITROSTAT) 0.4 MG SL tablet Place 0.4 mg under the tongue every 5 (five) minutes  as needed for chest pain. Reported on 10/22/2015    . polyethylene glycol (MIRALAX / GLYCOLAX) packet Take 17 g by mouth daily. Reported on 08/27/2015    . psyllium (METAMUCIL) 58.6 % powder Take 1 packet by mouth daily. Reported on 08/27/2015    . ranitidine (ZANTAC) 75 MG tablet Take 75 mg by mouth 2 (two) times daily.    . rosuvastatin (CRESTOR) 5 MG tablet Take 5 mg by mouth every morning.     [name] SYNTHROID 112 MCG tablet Take 112 mcg by mouth daily.    . valsartan-hydrochlorothiazide (DIOVAN-HCT) 320-25 MG per tablet Take 1 tablet by mouth every morning.      No current facility-administered medications for this visit.     SURGICAL HISTORY:  Past Surgical History:  Procedure  Laterality Date  . COLONOSCOPY WITH PROPOFOL N/A 03/19/2013   Procedure: COLONOSCOPY WITH PROPOFOL;  Surgeon: Garlan Fair, MD;  Location: [facility];  Service: Endoscopy;  Laterality: N/A;  . Cystourethroscopy, [facility].  04/16/2010  . NASAL SINUS SURGERY  1987  . Right video-assisted thoracoscopy, right upper lobectomy and  01/23/2006  . The Olympus video bronchoscope was introduced via the right  12/21/2005  . TONSILLECTOMY  1957  . Torn medial and lateral menisci, left knee.  11/06/2006    REVIEW OF SYSTEMS:  A comprehensive review of systems was negative.   PHYSICAL EXAMINATION: General appearance: alert, cooperative and no distress Head: Normocephalic, without obvious abnormality, atraumatic Neck: no adenopathy, no JVD, supple, symmetrical, trachea midline and thyroid not enlarged, symmetric, no tenderness/mass/nodules Lymph nodes: Cervical, supraclavicular, and axillary nodes normal. Resp: clear to auscultation bilaterally Back: symmetric, no curvature. ROM normal. No CVA tenderness. Cardio: regular rate and rhythm, S1, S2 normal, no murmur, click, rub or gallop GI: soft, non-tender; bowel sounds normal; no masses,  no organomegaly Extremities: extremities normal, atraumatic, no cyanosis or edema  ECOG PERFORMANCE STATUS: 0 - Asymptomatic  Blood pressure 111/68, pulse 65, temperature 97.8 F (36.6 C), temperature source Oral, resp. rate (!) 22, height _0  (1.854 m), weight 241 lb 9.6 oz (109.6 kg), SpO2 99 %.  LABORATORY DATA: Lab Results  Component Value Date   WBC 5.5 08/04/2016   HGB 12.4 (L) 08/04/2016   HCT 37.3 (L) 08/04/2016   MCV 94.9 08/04/2016   PLT 207 08/04/2016      Chemistry      Component Value Date/Time   NA 142 07/07/2016 0826   K 4.2 07/07/2016 0826   CL 98 (L) 05/12/2016 1602   CL 102 04/24/2012 0853   CO2 27 07/07/2016 0826   BUN 16.7 07/07/2016 0826   CREATININE 1.0 07/07/2016 0826      Component Value Date/Time   CALCIUM 9.7  07/07/2016 0826   ALKPHOS 60 07/07/2016 0826   AST 24 07/07/2016 0826   ALT 24 07/07/2016 0826   BILITOT 0.32 07/07/2016 0826       RADIOGRAPHIC STUDIES: Ct Chest W Contrast  Result Date: 07/05/2016 CLINICAL DATA:  Lung cancer. Recurrent non-small cell status post right upper lobectomy. EXAM: CT CHEST, ABDOMEN, AND PELVIS WITH CONTRAST TECHNIQUE: Multidetector CT imaging of the chest, abdomen and pelvis was performed following the standard protocol during bolus administration of intravenous contrast. CONTRAST:  18m ISOVUE-300 IOPAMIDOL (ISOVUE-300) INJECTION 61% COMPARISON:  04/14/2016 FINDINGS: RECIST 1.1 Target Lesions: 1. Right lower lobe pulmonary nodule (image 57) measures 15 x 15 mm today compared to 16 x 15 mm previously. 2. Right lower lobe pulmonary nodule (image 62) measures  6 x 17 mm today compared to 6 x 16 mm previously. Non-target Lesions: 1. Sub solid right anterior lower lobe nodule.  Not identified. 2. Right lower lobe pulmonary nodule (image 74) measures 7 mm today compared to 7 mm previously. CT CHEST FINDINGS Cardiovascular: The heart size is normal. No pericardial effusion. Atherosclerotic calcification is noted in the wall of the thoracic aorta. Mediastinum/Nodes: No mediastinal lymphadenopathy. There is no hilar lymphadenopathy. The esophagus has normal imaging features. There is no axillary lymphadenopathy. Lungs/Pleura: Volume loss right hemithorax consistent with prior right upper lobectomy no new or progressive pulmonary nodule or mass. No focal airspace consolidation. No pulmonary edema or pleural effusion Musculoskeletal: Bone windows reveal no worrisome lytic or sclerotic osseous lesions. CT ABDOMEN PELVIS FINDINGS Hepatobiliary: No focal abnormality within the liver parenchyma. 2.9 cm calcified gallstone evident. No intrahepatic or extrahepatic biliary dilation. Pancreas: No focal mass lesion. No dilatation of the main duct. No intraparenchymal cyst. No peripancreatic  edema. Spleen: No splenomegaly. No focal mass lesion. Adrenals/Urinary Tract: No adrenal nodule or mass. 17 mm exophytic cyst interpolar right kidney is stable. Left kidney unremarkable. No evidence for hydroureter. The urinary bladder appears normal for the degree of distention. Stomach/Bowel: Stomach is nondistended. No gastric wall thickening. No evidence of outlet obstruction. Duodenum is normally positioned as is the ligament of Treitz. No small bowel wall thickening. No small bowel dilatation. The terminal ileum is normal. The appendix is normal. No gross colonic mass. No colonic wall thickening. No substantial diverticular change. Vascular/Lymphatic: There is abdominal aortic atherosclerosis without aneurysm. There is no gastrohepatic or hepatoduodenal ligament lymphadenopathy. No intraperitoneal or retroperitoneal lymphadenopathy. No pelvic sidewall lymphadenopathy. Reproductive: Prostate gland is enlarged. Other: No intraperitoneal free fluid. Musculoskeletal: Bilateral groin hernias contain only fat. Bone windows reveal no worrisome lytic or sclerotic osseous lesions. IMPRESSION: 1. Stable exam.  No new or progressive findings. 2. Right-sided pulmonary nodules are unchanged. 3. Cholelithiasis. 4.  Abdominal Aortic Atherosclerois (ICD10-170.0) Electronically Signed   By: Misty Stanley M.D.   On: 07/05/2016 12:39   Ct Abdomen Pelvis W Contrast  Result Date: 07/05/2016 CLINICAL DATA:  Lung cancer. Recurrent non-small cell status post right upper lobectomy. EXAM: CT CHEST, ABDOMEN, AND PELVIS WITH CONTRAST TECHNIQUE: Multidetector CT imaging of the chest, abdomen and pelvis was performed following the standard protocol during bolus administration of intravenous contrast. CONTRAST:  179m ISOVUE-300 IOPAMIDOL (ISOVUE-300) INJECTION 61% COMPARISON:  04/14/2016 FINDINGS: RECIST 1.1 Target Lesions: 1. Right lower lobe pulmonary nodule (image 57) measures 15 x 15 mm today compared to 16 x 15 mm previously. 2.  Right lower lobe pulmonary nodule (image 62) measures 6 x 17 mm today compared to 6 x 16 mm previously. Non-target Lesions: 1. Sub solid right anterior lower lobe nodule.  Not identified. 2. Right lower lobe pulmonary nodule (image 74) measures 7 mm today compared to 7 mm previously. CT CHEST FINDINGS Cardiovascular: The heart size is normal. No pericardial effusion. Atherosclerotic calcification is noted in the wall of the thoracic aorta. Mediastinum/Nodes: No mediastinal lymphadenopathy. There is no hilar lymphadenopathy. The esophagus has normal imaging features. There is no axillary lymphadenopathy. Lungs/Pleura: Volume loss right hemithorax consistent with prior right upper lobectomy no new or progressive pulmonary nodule or mass. No focal airspace consolidation. No pulmonary edema or pleural effusion Musculoskeletal: Bone windows reveal no worrisome lytic or sclerotic osseous lesions. CT ABDOMEN PELVIS FINDINGS Hepatobiliary: No focal abnormality within the liver parenchyma. 2.9 cm calcified gallstone evident. No intrahepatic or extrahepatic biliary dilation. Pancreas: No  focal mass lesion. No dilatation of the main duct. No intraparenchymal cyst. No peripancreatic edema. Spleen: No splenomegaly. No focal mass lesion. Adrenals/Urinary Tract: No adrenal nodule or mass. 17 mm exophytic cyst interpolar right kidney is stable. Left kidney unremarkable. No evidence for hydroureter. The urinary bladder appears normal for the degree of distention. Stomach/Bowel: Stomach is nondistended. No gastric wall thickening. No evidence of outlet obstruction. Duodenum is normally positioned as is the ligament of Treitz. No small bowel wall thickening. No small bowel dilatation. The terminal ileum is normal. The appendix is normal. No gross colonic mass. No colonic wall thickening. No substantial diverticular change. Vascular/Lymphatic: There is abdominal aortic atherosclerosis without aneurysm. There is no gastrohepatic or  hepatoduodenal ligament lymphadenopathy. No intraperitoneal or retroperitoneal lymphadenopathy. No pelvic sidewall lymphadenopathy. Reproductive: Prostate gland is enlarged. Other: No intraperitoneal free fluid. Musculoskeletal: Bilateral groin hernias contain only fat. Bone windows reveal no worrisome lytic or sclerotic osseous lesions. IMPRESSION: 1. Stable exam.  No new or progressive findings. 2. Right-sided pulmonary nodules are unchanged. 3. Cholelithiasis. 4.  Abdominal Aortic Atherosclerois (ICD10-170.0) Electronically Signed   By: Misty Stanley M.D.   On: 07/05/2016 12:39    ASSESSMENT AND PLAN:  This is a very pleasant 71 years old white male with recurrent non-small cell lung cancer, adenocarcinoma with positive EGFR mutation exon 21 and currently on treatment with Tarceva 100 mg by mouth daily status post 26 months of treatment. The patient continues to tolerate the treatment well with no significant adverse effects. He denied having any significant skin rash or diarrhea. I recommended for him to continue his current treatment with Tarceva with the same dose. I will see him back for follow-up visit in one month for reevaluation with repeat blood work. He would have repeat CT scan of the chest, abdomen and pelvis in 2 months. He was advised to call immediately if he has any concerning symptoms in the interval. The patient voices understanding of current disease status and treatment options and is in agreement with the current care plan. All questions were answered. The patient knows to call the clinic with any problems, questions or concerns. We can certainly see the patient much sooner if necessary. I spent 10 minutes counseling the patient face to face. The total time spent in the appointment was 15 minutes.  Disclaimer: This note was dictated with voice recognition software. Similar sounding words can inadvertently be transcribed and may not be corrected upon review.

## 2016-08-04 NOTE — Telephone Encounter (Signed)
Appointments scheduled per 5.3.18 LOS. Patient given AVS report and calendars with future scheduled appointments.  Patient given two bottles of contrast and instructions for CT Scan appointments.

## 2016-08-16 MED FILL — TARCEVA 100 MG TABLET: 100 | 30 days supply | Qty: 30 | Fill #2

## 2016-09-01 ENCOUNTER — Other Ambulatory Visit (HOSPITAL_BASED_OUTPATIENT_CLINIC_OR_DEPARTMENT_OTHER): Payer: Medicare Other

## 2016-09-01 ENCOUNTER — Encounter: Payer: Self-pay | Admitting: Internal Medicine

## 2016-09-01 ENCOUNTER — Ambulatory Visit (HOSPITAL_BASED_OUTPATIENT_CLINIC_OR_DEPARTMENT_OTHER): Payer: Medicare Other | Admitting: Internal Medicine

## 2016-09-01 ENCOUNTER — Encounter: Payer: Self-pay | Admitting: *Deleted

## 2016-09-01 VITALS — BP 117/74 | HR 64 | Temp 97.8°F | Resp 18 | Ht 73.0 in | Wt 239.9 lb

## 2016-09-01 DIAGNOSIS — D649 Anemia, unspecified: Secondary | ICD-10-CM | POA: Diagnosis not present

## 2016-09-01 DIAGNOSIS — C3431 Malignant neoplasm of lower lobe, right bronchus or lung: Secondary | ICD-10-CM

## 2016-09-01 DIAGNOSIS — Z5111 Encounter for antineoplastic chemotherapy: Secondary | ICD-10-CM

## 2016-09-01 LAB — CBC WITH DIFFERENTIAL/PLATELET
BASO%: 0.4 % (ref 0.0–2.0)
Basophils Absolute: 0 10*3/uL (ref 0.0–0.1)
EOS%: 4.3 % (ref 0.0–7.0)
Eosinophils Absolute: 0.2 10*3/uL (ref 0.0–0.5)
HCT: 38.2 % — ABNORMAL LOW (ref 38.4–49.9)
HGB: 12.6 g/dL — ABNORMAL LOW (ref 13.0–17.1)
LYMPH%: 39.5 % (ref 14.0–49.0)
MCH: 31.9 pg (ref 27.2–33.4)
MCHC: 33 g/dL (ref 32.0–36.0)
MCV: 96.7 fL (ref 79.3–98.0)
MONO#: 0.7 10*3/uL (ref 0.1–0.9)
MONO%: 13.2 % (ref 0.0–14.0)
NEUT%: 42.6 % (ref 39.0–75.0)
NEUTROS ABS: 2.3 10*3/uL (ref 1.5–6.5)
PLATELETS: 216 10*3/uL (ref 140–400)
RBC: 3.95 10*6/uL — AB (ref 4.20–5.82)
RDW: 13.4 % (ref 11.0–14.6)
WBC: 5.3 10*3/uL (ref 4.0–10.3)
lymph#: 2.1 10*3/uL (ref 0.9–3.3)

## 2016-09-01 LAB — COMPREHENSIVE METABOLIC PANEL
ALT: 27 U/L (ref 0–55)
ANION GAP: 9 meq/L (ref 3–11)
AST: 30 U/L (ref 5–34)
Albumin: 4.3 g/dL (ref 3.5–5.0)
Alkaline Phosphatase: 65 U/L (ref 40–150)
BILIRUBIN TOTAL: 0.35 mg/dL (ref 0.20–1.20)
BUN: 16.9 mg/dL (ref 7.0–26.0)
CO2: 28 meq/L (ref 22–29)
Calcium: 10 mg/dL (ref 8.4–10.4)
Chloride: 104 mEq/L (ref 98–109)
Creatinine: 1.1 mg/dL (ref 0.7–1.3)
EGFR: 67 mL/min/{1.73_m2} — ABNORMAL LOW (ref >90)
Glucose: 104 mg/dl (ref 70–140)
Potassium: 3.9 mEq/L (ref 3.5–5.1)
Sodium: 141 mEq/L (ref 136–145)
TOTAL PROTEIN: 7.1 g/dL (ref 6.4–8.3)

## 2016-09-01 NOTE — Progress Notes (Signed)
09/01/16 at 10:16am - Reconsent visit- [id number] version 5, dated 07/29/16- The pt was into the cancer center this morning with his wife for his routine appointment with Dr. [name].  The pt was greeted upon arrival and given the new consent form to review prior to his MD appointment.  The consent form was highlighted with all of the new information.  The pt reviewed the consent form with special attention given to the highlighted new information. The pt met with Dr. [name], and Dr. [name] answered all of his questions related to the consent form changes.  The research nurse then met with the pt and reviewed again all of the new information.  The pt said that he was comfortable signing the new consent form.  The pt signed/dated the 30 page consent form.  The pt was given a copy of his signed consent form for his records.  The pt was thanked for his continued support of this trial. Brion Aliment RN, BSN, CCRP  Clinical Research Nurse 09/01/2016 10:28 AM

## 2016-09-01 NOTE — Progress Notes (Signed)
[facility] Telephone:(336) [phone]   Fax:(336) [phone]  OFFICE PROGRESS NOTE  [name], MD 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  PRIOR THERAPY: 1) Status post right upper lobectomy under the care of Dr. Roxan Hockey on 01/23/2006.  2) Tarceva 150 mg by mouth daily as part of the BMS checkmate 370 clinical trial. Status post 6 weeks of treatment.  CURRENT THERAPY: Tarceva 100 mg by mouth daily as part of the BMS Checkmate 370 clinical trial. First dose started 07/17/2014 status post 27 months of treatment.  INTERVAL HISTORY: [name] 71 y.o. male returns to the clinic today for follow-up visit accompanied by his wife. The patient is feeling fine today with no specific complaints. He denied having any chest pain, shortness of breath, cough or hemoptysis. He denied having any fever or chills. He has no nausea, vomiting, diarrhea or constipation. He is tolerating his treatment with Tarceva fairly well was no significant adverse effects. He is here today for evaluation and repeat blood work.  MEDICAL HISTORY: Past Medical History:  Diagnosis Date  . Cholelithiases 07/16/2015  . Complication of anesthesia 2007   quit breathing with bronchoscopy, had to spend night  . History of agent Orange exposure 44-45 yrs ago  . History of pneumonia  last 3-4 yrs ago   3 -4 different times  . Hypercholesterolemia   . Hypertension   . Hypothyroidism   . lung ca dx'd 2007   lung right  . Sinus disease    hx of  . Sleep apnea    uses cpap setting opf 12    ALLERGIES:  is allergic to lisinopril and prednisone.  MEDICATIONS:  Current Outpatient Prescriptions  Medication Sig Dispense Refill  . amitriptyline (ELAVIL) 50 MG tablet Take 50 mg by mouth at bedtime. Reported on  06/18/2015    . aspirin 81 MG tablet Take 81 mg by mouth every morning.     . Cholecalciferol 2000 units CAPS Take 2,000 Units by mouth daily.     . clindamycin (CLEOCIN-T) 1 % external solution Apply 1 application topically 2 (two) times daily as needed (for skin irritation). 240 mL 0  . clindamycin (CLINDAGEL) 1 % gel Apply topically 2 (two) times daily. 30 g 0  . diltiazem (CARDIZEM CD) 360 MG 24 hr capsule Take 360 mg by mouth every morning.     . erlotinib (TARCEVA) 100 MG tablet Take 1 tablet (100 mg total) by mouth daily. Take on an empty stomach 1 hour before meals or 2 hours after. 30 tablet 2  . levETIRAcetam (KEPPRA) 750 MG tablet Take 750 mg by mouth at bedtime.     . metoCLOPramide (REGLAN) 10 MG tablet Take 10 mg by mouth every 6 (six) hours as needed for nausea.     . metoprolol succinate (TOPROL-XL) 25 MG 24 hr tablet Take 25 mg by mouth every morning.     . Multiple Vitamins-Minerals (CENTRUM SILVER PO) Take 1 tablet by mouth daily.     . naproxen sodium (ANAPROX) 550 MG tablet Take 550 mg by mouth 3 (three) times daily as needed for headache (or pain). Reported on 08/27/2015    . nitroGLYCERIN (NITROSTAT) 0.4 MG SL tablet Place 0.4 mg under the tongue every 5 (five) minutes as needed for chest pain. Reported  on 10/22/2015    . polyethylene glycol (MIRALAX / GLYCOLAX) packet Take 17 g by mouth daily. Reported on 08/27/2015    . psyllium (METAMUCIL) 58.6 % powder Take 1 packet by mouth daily. Reported on 08/27/2015    . ranitidine (ZANTAC) 75 MG tablet Take 75 mg by mouth 2 (two) times daily.    . rosuvastatin (CRESTOR) 5 MG tablet Take 5 mg by mouth every morning.     [name] SYNTHROID 112 MCG tablet Take 112 mcg by mouth daily.    . valsartan-hydrochlorothiazide (DIOVAN-HCT) 320-25 MG per tablet Take 1 tablet by mouth every morning.      No current facility-administered medications for this visit.     SURGICAL HISTORY:  Past Surgical History:  Procedure Laterality Date  . COLONOSCOPY  WITH PROPOFOL N/A 03/19/2013   Procedure: COLONOSCOPY WITH PROPOFOL;  Surgeon: Garlan Fair, MD;  Location: [facility];  Service: Endoscopy;  Laterality: N/A;  . Cystourethroscopy, [facility].  04/16/2010  . NASAL SINUS SURGERY  1987  . Right video-assisted thoracoscopy, right upper lobectomy and  01/23/2006  . The Olympus video bronchoscope was introduced via the right  12/21/2005  . TONSILLECTOMY  1957  . Torn medial and lateral menisci, left knee.  11/06/2006    REVIEW OF SYSTEMS:  A comprehensive review of systems was negative.   PHYSICAL EXAMINATION: General appearance: alert, cooperative and no distress Head: Normocephalic, without obvious abnormality, atraumatic Neck: no adenopathy, no JVD, supple, symmetrical, trachea midline and thyroid not enlarged, symmetric, no tenderness/mass/nodules Lymph nodes: Cervical, supraclavicular, and axillary nodes normal. Resp: clear to auscultation bilaterally Back: symmetric, no curvature. ROM normal. No CVA tenderness. Cardio: regular rate and rhythm, S1, S2 normal, no murmur, click, rub or gallop GI: soft, non-tender; bowel sounds normal; no masses,  no organomegaly Extremities: extremities normal, atraumatic, no cyanosis or edema  ECOG PERFORMANCE STATUS: 0 - Asymptomatic  Blood pressure 117/74, pulse 64, temperature 97.8 F (36.6 C), temperature source Oral, resp. rate 18, height '6\' 1"'  (1.854 m), weight 239 lb 14.4 oz (108.8 kg), SpO2 98 %.  LABORATORY DATA: Lab Results  Component Value Date   WBC 5.3 09/01/2016   HGB 12.6 (L) 09/01/2016   HCT 38.2 (L) 09/01/2016   MCV 96.7 09/01/2016   PLT 216 09/01/2016      Chemistry      Component Value Date/Time   NA 141 09/01/2016 0900   K 3.9 09/01/2016 0900   CL 98 (L) 05/12/2016 1602   CL 102 04/24/2012 0853   CO2 28 09/01/2016 0900   BUN 16.9 09/01/2016 0900   CREATININE 1.1 09/01/2016 0900      Component Value Date/Time   CALCIUM 10.0 09/01/2016 0900   ALKPHOS 65  09/01/2016 0900   AST 30 09/01/2016 0900   ALT 27 09/01/2016 0900   BILITOT 0.35 09/01/2016 0900       RADIOGRAPHIC STUDIES: No results found.  ASSESSMENT AND PLAN:  This is a very pleasant 71 years old white male with recurrent non-small cell lung cancer, adenocarcinoma with positive EGFR mutation in exon 21. He is currently on treatment with Tarceva 100 mg by mouth daily status post 27 months. He has been tolerating his treatment well with no significant adverse effects. CBC and comprehensive metabolic panel performed earlier today are unremarkable except for persistent mild anemia. I recommended for the patient to continue his current treatment with Tarceva. I will see him back for follow-up visit in one month's for evaluation after repeating CT scan  of the chest, abdomen and pelvis for restaging of his disease. He was advised to call immediately if he has any concerning symptoms in the interval. The patient voices understanding of current disease status and treatment options and is in agreement with the current care plan. All questions were answered. The patient knows to call the clinic with any problems, questions or concerns. We can certainly see the patient much sooner if necessary. I spent 10 minutes counseling the patient face to face. The total time spent in the appointment was 15 minutes.  Disclaimer: This note was dictated with voice recognition software. Similar sounding words can inadvertently be transcribed and may not be corrected upon review.

## 2016-09-02 ENCOUNTER — Telehealth: Payer: Self-pay | Admitting: Internal Medicine

## 2016-09-02 DIAGNOSIS — E669 Obesity, unspecified: Secondary | ICD-10-CM | POA: Diagnosis not present

## 2016-09-02 DIAGNOSIS — G4730 Sleep apnea, unspecified: Secondary | ICD-10-CM | POA: Diagnosis not present

## 2016-09-02 DIAGNOSIS — E039 Hypothyroidism, unspecified: Secondary | ICD-10-CM | POA: Diagnosis not present

## 2016-09-02 DIAGNOSIS — Z1389 Encounter for screening for other disorder: Secondary | ICD-10-CM | POA: Diagnosis not present

## 2016-09-02 DIAGNOSIS — I10 Essential (primary) hypertension: Secondary | ICD-10-CM | POA: Diagnosis not present

## 2016-09-02 DIAGNOSIS — E782 Mixed hyperlipidemia: Secondary | ICD-10-CM | POA: Diagnosis not present

## 2016-09-02 DIAGNOSIS — N400 Enlarged prostate without lower urinary tract symptoms: Secondary | ICD-10-CM | POA: Diagnosis not present

## 2016-09-02 DIAGNOSIS — K219 Gastro-esophageal reflux disease without esophagitis: Secondary | ICD-10-CM | POA: Diagnosis not present

## 2016-09-02 DIAGNOSIS — Z79899 Other long term (current) drug therapy: Secondary | ICD-10-CM | POA: Diagnosis not present

## 2016-09-02 DIAGNOSIS — E559 Vitamin D deficiency, unspecified: Secondary | ICD-10-CM | POA: Diagnosis not present

## 2016-09-02 DIAGNOSIS — C3490 Malignant neoplasm of unspecified part of unspecified bronchus or lung: Secondary | ICD-10-CM | POA: Diagnosis not present

## 2016-09-02 DIAGNOSIS — I6529 Occlusion and stenosis of unspecified carotid artery: Secondary | ICD-10-CM | POA: Diagnosis not present

## 2016-09-02 DIAGNOSIS — Z6832 Body mass index (BMI) 32.0-32.9, adult: Secondary | ICD-10-CM | POA: Diagnosis not present

## 2016-09-02 DIAGNOSIS — Z0001 Encounter for general adult medical examination with abnormal findings: Secondary | ICD-10-CM | POA: Diagnosis not present

## 2016-09-02 DIAGNOSIS — I25110 Atherosclerotic heart disease of native coronary artery with unstable angina pectoris: Secondary | ICD-10-CM | POA: Diagnosis not present

## 2016-09-02 NOTE — Telephone Encounter (Signed)
NO LOS per 5/31

## 2016-09-12 ENCOUNTER — Encounter: Payer: Self-pay | Admitting: *Deleted

## 2016-09-12 ENCOUNTER — Telehealth: Payer: Self-pay | Admitting: Internal Medicine

## 2016-09-12 DIAGNOSIS — C3431 Malignant neoplasm of lower lobe, right bronchus or lung: Secondary | ICD-10-CM

## 2016-09-12 NOTE — Telephone Encounter (Signed)
Left message confirming 6/26 lab/fu. Schedule mailed.

## 2016-09-12 NOTE — Progress Notes (Signed)
09/12/2016 Notified Sherri Carlota Raspberry, [facility], via [facility] email that RECIST measurements are no longer required for this patient. Cindy S. Brigitte Pulse BSN, RN, CCRP 09/12/2016 4:32 PM

## 2016-09-13 MED FILL — TARCEVA 100 MG TABLET: 100 | 30 days supply | Qty: 30 | Fill #0

## 2016-09-27 ENCOUNTER — Encounter (HOSPITAL_COMMUNITY): Payer: Self-pay

## 2016-09-27 ENCOUNTER — Other Ambulatory Visit (HOSPITAL_BASED_OUTPATIENT_CLINIC_OR_DEPARTMENT_OTHER): Payer: Medicare Other

## 2016-09-27 ENCOUNTER — Encounter: Payer: Medicare Other | Admitting: *Deleted

## 2016-09-27 ENCOUNTER — Encounter: Payer: Self-pay | Admitting: Internal Medicine

## 2016-09-27 ENCOUNTER — Other Ambulatory Visit: Payer: Self-pay | Admitting: Medical Oncology

## 2016-09-27 ENCOUNTER — Telehealth: Payer: Self-pay | Admitting: Internal Medicine

## 2016-09-27 ENCOUNTER — Ambulatory Visit (HOSPITAL_BASED_OUTPATIENT_CLINIC_OR_DEPARTMENT_OTHER): Payer: Medicare Other | Admitting: Internal Medicine

## 2016-09-27 ENCOUNTER — Ambulatory Visit (HOSPITAL_COMMUNITY)
Admission: RE | Admit: 2016-09-27 | Discharge: 2016-09-27 | Disposition: A | Discharge status: Home / Self Care | Payer: Medicare Other | Source: Ambulatory Visit | Attending: Internal Medicine | Admitting: Internal Medicine

## 2016-09-27 VITALS — BP 154/87 | HR 58 | Temp 97.8°F | Resp 20 | Ht 73.0 in | Wt 241.7 lb

## 2016-09-27 DIAGNOSIS — E039 Hypothyroidism, unspecified: Secondary | ICD-10-CM | POA: Diagnosis not present

## 2016-09-27 DIAGNOSIS — Z006 Encounter for examination for normal comparison and control in clinical research program: Secondary | ICD-10-CM | POA: Diagnosis not present

## 2016-09-27 DIAGNOSIS — Z5111 Encounter for antineoplastic chemotherapy: Secondary | ICD-10-CM | POA: Insufficient documentation

## 2016-09-27 DIAGNOSIS — C3431 Malignant neoplasm of lower lobe, right bronchus or lung: Secondary | ICD-10-CM

## 2016-09-27 DIAGNOSIS — K5900 Constipation, unspecified: Secondary | ICD-10-CM | POA: Insufficient documentation

## 2016-09-27 DIAGNOSIS — C3491 Malignant neoplasm of unspecified part of right bronchus or lung: Secondary | ICD-10-CM | POA: Diagnosis not present

## 2016-09-27 DIAGNOSIS — M5137 Other intervertebral disc degeneration, lumbosacral region: Secondary | ICD-10-CM | POA: Insufficient documentation

## 2016-09-27 DIAGNOSIS — K8020 Calculus of gallbladder without cholecystitis without obstruction: Secondary | ICD-10-CM | POA: Diagnosis not present

## 2016-09-27 DIAGNOSIS — I700 Atherosclerosis of aorta: Secondary | ICD-10-CM | POA: Diagnosis not present

## 2016-09-27 DIAGNOSIS — I10 Essential (primary) hypertension: Secondary | ICD-10-CM | POA: Insufficient documentation

## 2016-09-27 LAB — CBC WITH DIFFERENTIAL/PLATELET
BASO%: 0.8 % (ref 0.0–2.0)
Basophils Absolute: 0 10*3/uL (ref 0.0–0.1)
EOS ABS: 0.1 10*3/uL (ref 0.0–0.5)
EOS%: 2.7 % (ref 0.0–7.0)
HCT: 38.5 % (ref 38.4–49.9)
HEMOGLOBIN: 12.9 g/dL — AB (ref 13.0–17.1)
LYMPH%: 31.4 % (ref 14.0–49.0)
MCH: 32 pg (ref 27.2–33.4)
MCHC: 33.5 g/dL (ref 32.0–36.0)
MCV: 95.6 fL (ref 79.3–98.0)
MONO#: 0.6 10*3/uL (ref 0.1–0.9)
MONO%: 10.9 % (ref 0.0–14.0)
NEUT%: 54.2 % (ref 39.0–75.0)
NEUTROS ABS: 2.9 10*3/uL (ref 1.5–6.5)
Platelets: 229 10*3/uL (ref 140–400)
RBC: 4.03 10*6/uL — AB (ref 4.20–5.82)
RDW: 14.5 % (ref 11.0–14.6)
WBC: 5.3 10*3/uL (ref 4.0–10.3)
lymph#: 1.7 10*3/uL (ref 0.9–3.3)

## 2016-09-27 LAB — COMPREHENSIVE METABOLIC PANEL
ALBUMIN: 4.2 g/dL (ref 3.5–5.0)
ALK PHOS: 55 U/L (ref 40–150)
ALT: 29 U/L (ref 0–55)
ANION GAP: 10 meq/L (ref 3–11)
AST: 31 U/L (ref 5–34)
BILIRUBIN TOTAL: 0.43 mg/dL (ref 0.20–1.20)
BUN: 13.5 mg/dL (ref 7.0–26.0)
CO2: 31 mEq/L — ABNORMAL HIGH (ref 22–29)
Calcium: 10 mg/dL (ref 8.4–10.4)
Chloride: 100 mEq/L (ref 98–109)
Creatinine: 1 mg/dL (ref 0.7–1.3)
EGFR: 73 mL/min/{1.73_m2} — AB (ref >90)
Glucose: 94 mg/dl (ref 70–140)
Potassium: 3.9 mEq/L (ref 3.5–5.1)
Sodium: 141 mEq/L (ref 136–145)
TOTAL PROTEIN: 7 g/dL (ref 6.4–8.3)

## 2016-09-27 MED ORDER — IOPAMIDOL (ISOVUE-300) INJECTION 61%
INTRAVENOUS | Status: AC
  Administered 2016-09-27: 100 mL via INTRAVENOUS
  Filled 2016-09-27: qty 100

## 2016-09-27 MED ORDER — IOPAMIDOL (ISOVUE-300) INJECTION 61%
100.0000 mL | Freq: Once | INTRAVENOUS | Status: AC | PRN
  Administered 2016-09-27: 100 mL via INTRAVENOUS

## 2016-09-27 NOTE — Progress Notes (Signed)
[facility] [phone]   [phone]  OFFICE PROGRESS NOTE  [name], MD 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  PRIOR THERAPY: 1) Status post right upper lobectomy under the care of Dr. Roxan Hockey on 01/23/2006.  2) Tarceva 150 mg by mouth daily as part of the BMS checkmate 370 clinical trial. Status post 6 weeks of treatment.  CURRENT THERAPY: Tarceva 100 mg by mouth daily as part of the BMS Checkmate 370 clinical trial. First dose started 07/17/2014 status post 28 months of treatment.  INTERVAL HISTORY: [name] 71 y.o. male returns to the clinic today for follow-up visit accompanied by his wife. The patient is feeling very well today with no specific complaints. He denied having any chest pain, shortness of breath, cough or hemoptysis. He denied having any recent weight loss or night sweats. He has no nausea, vomiting, diarrhea or constipation. He denied having any skin rash. He had repeat CT scan of the chest, abdomen and pelvis performed recently and he is here today for evaluation and discussion of his scan results.  MEDICAL HISTORY: Past Medical History:  Diagnosis Date  . Cholelithiases 07/16/2015  . Complication of anesthesia 2007   quit breathing with bronchoscopy, had to spend night  . History of agent Orange exposure 44-45 yrs ago  . History of pneumonia  last 3-4 yrs ago   3 -4 different times  . Hypercholesterolemia   . Hypertension   . Hypothyroidism   . lung ca dx'd 2007   lung right  . Sinus disease    hx of  . Sleep apnea    uses cpap setting opf 12    ALLERGIES:  is allergic to lisinopril and prednisone.  MEDICATIONS:  Current Outpatient Prescriptions  Medication Sig Dispense Refill  . amitriptyline (ELAVIL) 50 MG  tablet Take 50 mg by mouth at bedtime. Reported on 06/18/2015    . aspirin 81 MG tablet Take 81 mg by mouth every morning.     . Cholecalciferol 2000 units CAPS Take 2,000 Units by mouth daily.     . clindamycin (CLEOCIN-T) 1 % external solution Apply 1 application topically 2 (two) times daily as needed (for skin irritation). 240 mL 0  . clindamycin (CLINDAGEL) 1 % gel Apply topically 2 (two) times daily. 30 g 0  . diltiazem (CARDIZEM CD) 360 MG 24 hr capsule Take 360 mg by mouth every morning.     . erlotinib (TARCEVA) 100 MG tablet Take 1 tablet (100 mg total) by mouth daily. Take on an empty stomach 1 hour before meals or 2 hours after. 30 tablet 2  . levETIRAcetam (KEPPRA) 750 MG tablet Take 750 mg by mouth at bedtime.     . metoCLOPramide (REGLAN) 10 MG tablet Take 10 mg by mouth every 6 (six) hours as needed for nausea.     . metoprolol succinate (TOPROL-XL) 25 MG 24 hr tablet Take 25 mg by mouth every morning.     . Multiple Vitamins-Minerals (CENTRUM SILVER PO) Take 1 tablet by mouth daily.     . naproxen sodium (ANAPROX) 550 MG tablet Take 550 mg by mouth 3 (three) times daily as needed for headache (or pain). Reported on 08/27/2015    . nitroGLYCERIN (NITROSTAT) 0.4 MG SL tablet Place 0.4 mg under  the tongue every 5 (five) minutes as needed for chest pain. Reported on 10/22/2015    . polyethylene glycol (MIRALAX / GLYCOLAX) packet Take 17 g by mouth daily. Reported on 08/27/2015    . psyllium (METAMUCIL) 58.6 % powder Take 1 packet by mouth daily. Reported on 08/27/2015    . ranitidine (ZANTAC) 75 MG tablet Take 75 mg by mouth 2 (two) times daily.    . rosuvastatin (CRESTOR) 5 MG tablet Take 5 mg by mouth every morning.     [name] SYNTHROID 112 MCG tablet Take 112 mcg by mouth daily.    . valsartan-hydrochlorothiazide (DIOVAN-HCT) 320-25 MG per tablet Take 1 tablet by mouth every morning.      No current facility-administered medications for this visit.     SURGICAL HISTORY:  Past Surgical  History:  Procedure Laterality Date  . COLONOSCOPY WITH PROPOFOL N/A 03/19/2013   Procedure: COLONOSCOPY WITH PROPOFOL;  Surgeon: Garlan Fair, MD;  Location: [facility];  Service: Endoscopy;  Laterality: N/A;  . Cystourethroscopy, [facility] TURP.  04/16/2010  . NASAL SINUS SURGERY  1987  . Right video-assisted thoracoscopy, right upper lobectomy and  01/23/2006  . The Olympus video bronchoscope was introduced via the right  12/21/2005  . TONSILLECTOMY  1957  . Torn medial and lateral menisci, left knee.  11/06/2006    REVIEW OF SYSTEMS:  Constitutional: negative Eyes: negative Ears, nose, mouth, throat, and face: negative Respiratory: negative Cardiovascular: negative Gastrointestinal: negative Genitourinary:negative Integument/breast: negative Hematologic/lymphatic: negative Musculoskeletal:negative Neurological: negative Behavioral/Psych: negative Endocrine: negative Allergic/Immunologic: negative   PHYSICAL EXAMINATION: General appearance: alert, cooperative and no distress Head: Normocephalic, without obvious abnormality, atraumatic Neck: no adenopathy, no JVD, supple, symmetrical, trachea midline and thyroid not enlarged, symmetric, no tenderness/mass/nodules Lymph nodes: Cervical, supraclavicular, and axillary nodes normal. Resp: clear to auscultation bilaterally Back: symmetric, no curvature. ROM normal. No CVA tenderness. Cardio: regular rate and rhythm, S1, S2 normal, no murmur, click, rub or gallop GI: soft, non-tender; bowel sounds normal; no masses,  no organomegaly Extremities: extremities normal, atraumatic, no cyanosis or edema Neurologic: Alert and oriented X 3, normal strength and tone. Normal symmetric reflexes. Normal coordination and gait  ECOG PERFORMANCE STATUS: 0 - Asymptomatic  Blood pressure (!) 154/87, pulse (!) 58, temperature 97.8 F (36.6 C), temperature source Oral, resp. rate 20, height '6\' 1"'  (1.854 m), weight 241 lb 11.2 oz (109.6 kg), SpO2  100 %.  LABORATORY DATA: Lab Results  Component Value Date   WBC 5.3 09/27/2016   HGB 12.9 (L) 09/27/2016   HCT 38.5 09/27/2016   MCV 95.6 09/27/2016   PLT 229 09/27/2016      Chemistry      Component Value Date/Time   NA 141 09/27/2016 1316   K 3.9 09/27/2016 1316   CL 98 (L) 05/12/2016 1602   CL 102 04/24/2012 0853   CO2 31 (H) 09/27/2016 1316   BUN 13.5 09/27/2016 1316   CREATININE 1.0 09/27/2016 1316      Component Value Date/Time   CALCIUM 10.0 09/27/2016 1316   ALKPHOS 55 09/27/2016 1316   AST 31 09/27/2016 1316   ALT 29 09/27/2016 1316   BILITOT 0.43 09/27/2016 1316       RADIOGRAPHIC STUDIES: Ct Chest W Contrast  Result Date: 09/27/2016 CLINICAL DATA:  Restaging of recurrent right lung cancer, ongoing Tarceva. Prior right upper lobectomy. EXAM: CT CHEST, ABDOMEN, AND PELVIS WITH CONTRAST TECHNIQUE: Multidetector CT imaging of the chest, abdomen and pelvis was performed following the standard protocol during bolus  administration of intravenous contrast. CONTRAST:  100 cc Isovue 300 COMPARISON:  Multiple exams, including 07/05/2016 FINDINGS: Please note: Although Recist measurements have been provided in the past, we are specifically instructed that Recist measurements are not desired for this current exam. CT CHEST FINDINGS Cardiovascular: Mild aortic arch atherosclerotic calcification. Mediastinum/Nodes: Small subcarinal and paraesophageal lymph nodes are not pathologically enlarged by size criteria. Lungs/Pleura: Right upper lobectomy. Bandlike scarring anteriorly in the right lower lobe with the associated triangular density not appreciably changed, an approximately 1.0 cm in thickness as shown on image 60/4. Slight bilobed nodularity along the anterior component measuring up to 7 mm in thickness on image 65/4, stable by my measurement. Musculoskeletal: Unremarkable CT ABDOMEN PELVIS FINDINGS Hepatobiliary: 2.9 cm in long axis gallstone in the gallbladder. Otherwise  unremarkable. Pancreas: Unremarkable Spleen: Unremarkable Adrenals/Urinary Tract: Small cyst of the right mid upper kidney anteriorly, stable. Adrenal glands normal. Stomach/Bowel: Prominent stool throughout the colon favors constipation. Vascular/Lymphatic: Aortoiliac atherosclerotic vascular disease. Reproductive: Unremarkable Other: No supplemental non-categorized findings. Musculoskeletal: Lower lumbar spondylosis and degenerative disc disease causing impingement especially at L5-S1 and to a lesser extent at L3-4 and L4-5. Chronic left lateral recess suspected disc material at the L5-S1 level extending cephalad. IMPRESSION: 1. No findings of active malignancy, or significant new or progressive findings. Stable scarring with some nodular components in the right lower lobe. 2.  Aortic Atherosclerosis (ICD10-I70.0). 3.  Prominent stool throughout the colon favors constipation. 4. Cholelithiasis. 5. Lumbar spondylosis and degenerative disc disease, particularly at L5-S1, causing impingement. Electronically Signed   By: Van Clines M.D.   On: 09/27/2016 10:18   Ct Abdomen Pelvis W Contrast  Result Date: 09/27/2016 CLINICAL DATA:  Restaging of recurrent right lung cancer, ongoing Tarceva. Prior right upper lobectomy. EXAM: CT CHEST, ABDOMEN, AND PELVIS WITH CONTRAST TECHNIQUE: Multidetector CT imaging of the chest, abdomen and pelvis was performed following the standard protocol during bolus administration of intravenous contrast. CONTRAST:  100 cc Isovue 300 COMPARISON:  Multiple exams, including 07/05/2016 FINDINGS: Please note: Although Recist measurements have been provided in the past, we are specifically instructed that Recist measurements are not desired for this current exam. CT CHEST FINDINGS Cardiovascular: Mild aortic arch atherosclerotic calcification. Mediastinum/Nodes: Small subcarinal and paraesophageal lymph nodes are not pathologically enlarged by size criteria. Lungs/Pleura: Right upper  lobectomy. Bandlike scarring anteriorly in the right lower lobe with the associated triangular density not appreciably changed, an approximately 1.0 cm in thickness as shown on image 60/4. Slight bilobed nodularity along the anterior component measuring up to 7 mm in thickness on image 65/4, stable by my measurement. Musculoskeletal: Unremarkable CT ABDOMEN PELVIS FINDINGS Hepatobiliary: 2.9 cm in long axis gallstone in the gallbladder. Otherwise unremarkable. Pancreas: Unremarkable Spleen: Unremarkable Adrenals/Urinary Tract: Small cyst of the right mid upper kidney anteriorly, stable. Adrenal glands normal. Stomach/Bowel: Prominent stool throughout the colon favors constipation. Vascular/Lymphatic: Aortoiliac atherosclerotic vascular disease. Reproductive: Unremarkable Other: No supplemental non-categorized findings. Musculoskeletal: Lower lumbar spondylosis and degenerative disc disease causing impingement especially at L5-S1 and to a lesser extent at L3-4 and L4-5. Chronic left lateral recess suspected disc material at the L5-S1 level extending cephalad. IMPRESSION: 1. No findings of active malignancy, or significant new or progressive findings. Stable scarring with some nodular components in the right lower lobe. 2.  Aortic Atherosclerosis (ICD10-I70.0). 3.  Prominent stool throughout the colon favors constipation. 4. Cholelithiasis. 5. Lumbar spondylosis and degenerative disc disease, particularly at L5-S1, causing impingement. Electronically Signed   By: Cindra Eves.D.  On: 09/27/2016 10:18    ASSESSMENT AND PLAN:  This is a very pleasant 71 years old white male with recurrent non-small cell lung cancer, adenocarcinoma and positive EGFR mutation in exon 21. He is currently on Tarceva 100 mg by mouth daily status post 28 months of treatment and has been tolerating the treatment fairly well. He had recent CT scan of the chest, abdomen and pelvis performed recently. I press and independently  reviewed the scan and discuss the results with the patient and his wife. His scan showed no evidence for disease progression. I recommended for the patient to continue his current treatment with Tarceva 100 mg by mouth daily. For hypertension, he was advised to continue on his blood pressure medication. For hypothyroidism, the patient will continue his treatment with levothyroxine. I will see him back for follow-up visit in one month for reevaluation and repeat blood work. His advised to call immediately if he has any concerning symptoms in the interval. The patient voices understanding of current disease status and treatment options and is in agreement with the current care plan. All questions were answered. The patient knows to call the clinic with any problems, questions or concerns. We can certainly see the patient much sooner if necessary.  Disclaimer: This note was dictated with voice recognition software. Similar sounding words can inadvertently be transcribed and may not be corrected upon review.

## 2016-09-27 NOTE — Telephone Encounter (Signed)
Scheduled appt per 6/26 los - Gave patient AVS and calender per los.

## 2016-09-27 NOTE — Progress Notes (Signed)
09/27/2016 Patient in to clinic today accompanied by his wife. Upon arrival to the clinic, patient-reported outcomes questionnaires were completed independently by the patient. Vital signs were obtained at rest. CT scan performed today at MD discretion, with no evidence of disease progression. As previously documented, no further imaging is required by the research study, beyond two years on study, therefore today's scan, as well as subsequent imaging, are done at investigator discretion. As noted in the AER log below, patient has ongoing previously reported adverse events, some are intermittent in nature. He denies any new complaints since his last visit here. The patient has had no emergency department visits, physician visits, hospitalizations or outpatient visits since the last healthcare utilization assessment on 07/07/16. He is not receiving home health care or any other type of non-protocol visits related to study therapy or disease.  Adverse Event Log Protocol: BMS [id number] FU X01 through FU X02: 07/07/2016 - 09/09/2016 [end of AE reporting period = 100 days from last dose] Event Grade Onset Date Resolved Date Attribution to erlotinib Comments  Fatigue 2 08/14/14 ongoing Not related   Dyspnea on exertion 1 09/11/14 ongoing Not related Intermittent, stable  Nail infection - right first toe 1 11/17/14 ongoing Not related   Rash - head, chest, neck, back (intermittent) 1 01/01/15 08/04/2016 Related   Right flank pain, int. 2 11/2015 ongoing Not related Improved to grade 1  GE reflux, intermittent 2 03/07/16 ongoing Not related   Paresthesia, cervical spine 1 06/16/16 ongoing Not related   Anemia 1 06/08/16 ongoing Not related   Not clinically significant: 08/04/2016:         Hct 37.3 (reference range LLN 38.4%)                                                                       RBC 3.93 (reference range 4.20-5.82 10e6/microliter) 09/01/2016:       Hct 38.2 (reference range LLN 38.4%)                                                                        RBC 3.95 (reference range 4.20-5.82 10e6/microliter)  Gwynn Burly. Brigitte Pulse BSN, RN, [place] 09/27/2016 1:58 PM

## 2016-09-29 ENCOUNTER — Ambulatory Visit: Payer: Medicare Other | Admitting: Internal Medicine

## 2016-09-29 ENCOUNTER — Other Ambulatory Visit: Payer: Medicare Other

## 2016-10-03 ENCOUNTER — Telehealth: Payer: Self-pay | Admitting: Pharmacy Technician

## 2016-10-03 NOTE — Telephone Encounter (Signed)
Oral Oncology Patient Advocate Encounter  Met patient in waiting room to complete application for [place] for Tarceva in an effort to reduce patient's out of pocket expense to $0.  (I had received notification from [facility] that Mr. Elahi was paying a high copayment every month.)  Application completed and faxed to [phone].   [employer] patient assistance phone number for follow up is [phone].   This encounter will be updated until final determination.   Fabio Asa. Melynda Keller, [facility] [phone] 10/03/2016 2:20 PM

## 2016-10-24 ENCOUNTER — Other Ambulatory Visit: Payer: Self-pay | Admitting: Thoracic Surgery (Cardiothoracic Vascular Surgery)

## 2016-10-24 ENCOUNTER — Telehealth: Payer: Self-pay

## 2016-10-24 DIAGNOSIS — C3490 Malignant neoplasm of unspecified part of unspecified bronchus or lung: Secondary | ICD-10-CM

## 2016-10-24 NOTE — Telephone Encounter (Signed)
Dr. [name] will be out of the office on 7/31. Per Jenny Reichmann S/research he can be r/s at anytime.  I called and left a message with a appt date and time  Red Mandt

## 2016-10-25 ENCOUNTER — Ambulatory Visit (INDEPENDENT_AMBULATORY_CARE_PROVIDER_SITE_OTHER): Payer: Medicare Other | Admitting: Thoracic Surgery (Cardiothoracic Vascular Surgery)

## 2016-10-25 ENCOUNTER — Ambulatory Visit
Admission: RE | Admit: 2016-10-25 | Discharge: 2016-10-25 | Disposition: A | Discharge status: Home / Self Care | Payer: Medicare Other | Source: Ambulatory Visit | Attending: Thoracic Surgery (Cardiothoracic Vascular Surgery) | Admitting: Thoracic Surgery (Cardiothoracic Vascular Surgery)

## 2016-10-25 ENCOUNTER — Encounter: Payer: Self-pay | Admitting: Thoracic Surgery (Cardiothoracic Vascular Surgery)

## 2016-10-25 VITALS — BP 119/79 | HR 63 | Resp 16 | Ht 73.0 in | Wt 240.0 lb

## 2016-10-25 DIAGNOSIS — R918 Other nonspecific abnormal finding of lung field: Secondary | ICD-10-CM | POA: Diagnosis not present

## 2016-10-25 DIAGNOSIS — C3411 Malignant neoplasm of upper lobe, right bronchus or lung: Secondary | ICD-10-CM | POA: Diagnosis not present

## 2016-10-25 DIAGNOSIS — Z902 Acquired absence of lung [part of]: Secondary | ICD-10-CM

## 2016-10-25 DIAGNOSIS — C3490 Malignant neoplasm of unspecified part of unspecified bronchus or lung: Secondary | ICD-10-CM

## 2016-10-25 NOTE — Telephone Encounter (Signed)
10/25/2016 Patient off protocol treatment and in follow-up only. Study follow-up visits only require quarterly phone calls, therefore all subsequent patient visits are to be scheduled at the physician's discretion. Note to provider: patient was rescheduled from 11/01/16 to 11/09/16; please contact scheduler if this appointment needs to be moved to a different date. Cindy S. Brigitte Pulse BSN, RN, [place] 10/25/2016 10:39 AM

## 2016-10-25 NOTE — Progress Notes (Signed)
[name]       [place] [address]             [phone]    HPI: [name] returns for a scheduled follow-up  He is a 71 year old man who had a lobectomy for stage IA non-small cell carcinoma in 2007. A couple of years ago he developed some slow-growing groundglass nodules in the right lung. Biopsy was positive for non-small cell carcinoma. He was a GFR positive. He has been on Tarceva for over 2 years now. He tolerates that well without significant side effects. His only complaint is a lack of energy and stamina.  He saw Dr. [name] in June. Scans at that time showed no progression of disease.  His wife walk on a regular basis. He is no longer having right flank pain. His weight is stable and his appetite is good.  Past Medical History:  Diagnosis Date  . Cholelithiases 07/16/2015  . Complication of anesthesia 2007   quit breathing with bronchoscopy, had to spend night  . History of agent Orange exposure 44-45 yrs ago  . History of pneumonia  last 3-4 yrs ago   3 -4 different times  . Hypercholesterolemia   . Hypertension   . Hypothyroidism   . lung ca dx'd 2007   lung right  . Sinus disease    hx of  . Sleep apnea    uses cpap setting opf 12    Current Outpatient Prescriptions  Medication Sig Dispense Refill  . amitriptyline (ELAVIL) 50 MG tablet Take 50 mg by mouth at bedtime. Reported on 06/18/2015    . aspirin 81 MG tablet Take 81 mg by mouth every morning.     . Cholecalciferol 2000 units CAPS Take 2,000 Units by mouth daily.     . clindamycin (CLEOCIN-T) 1 % external solution Apply 1 application topically 2 (two) times daily as needed (for skin irritation). 240 mL 0  . clindamycin (CLINDAGEL) 1 % gel Apply topically 2 (two) times daily. 30 g 0  . diltiazem (CARDIZEM CD) 360 MG 24 hr capsule Take 360 mg by mouth every morning.     . erlotinib (TARCEVA) 100 MG tablet Take 1 tablet (100 mg total) by mouth daily. Take on an empty stomach 1 hour  before meals or 2 hours after. 30 tablet 2  . levETIRAcetam (KEPPRA) 750 MG tablet Take 750 mg by mouth at bedtime.     . metoCLOPramide (REGLAN) 10 MG tablet Take 10 mg by mouth every 6 (six) hours as needed for nausea.     . metoprolol succinate (TOPROL-XL) 25 MG 24 hr tablet Take 25 mg by mouth every morning.     . Multiple Vitamins-Minerals (CENTRUM SILVER PO) Take 1 tablet by mouth daily.     . naproxen sodium (ANAPROX) 550 MG tablet Take 550 mg by mouth 3 (three) times daily as needed for headache (or pain). Reported on 08/27/2015    . nitroGLYCERIN (NITROSTAT) 0.4 MG SL tablet Place 0.4 mg under the tongue every 5 (five) minutes as needed for chest pain. Reported on 10/22/2015    . polyethylene glycol (MIRALAX / GLYCOLAX) packet Take 17 g by mouth daily. Reported on 08/27/2015    . psyllium (METAMUCIL) 58.6 % powder Take 1 packet by mouth daily. Reported on 08/27/2015    . ranitidine (ZANTAC) 75 MG tablet Take 75 mg by mouth 2 (two) times daily.    . rosuvastatin (CRESTOR) 5 MG tablet Take  5 mg by mouth every morning.     [name] SYNTHROID 112 MCG tablet Take 112 mcg by mouth daily.    . valsartan-hydrochlorothiazide (DIOVAN-HCT) 320-25 MG per tablet Take 1 tablet by mouth every morning.      No current facility-administered medications for this visit.     Physical Exam BP 119/79 (BP Location: Right Arm, Patient Position: Sitting, Cuff Size: Large)   Pulse 63   Resp 16   Ht _0  (1.854 m)   Wt 240 lb (108.9 kg)   SpO2 96% Comment: ON RA  BMI 31.2 kg/m  71 year old man in no acute distress Alert and oriented 2 no focal deficits No cervical or supraclavicular adenopathy Cardiac regular rate and rhythm normal S1 and S2 Lungs diminished at right base, otherwise clear  Diagnostic Tests: CHEST  2 VIEW  COMPARISON:  CT chest of 09/27/2016  FINDINGS: There is persistent chronic elevation of the right hemidiaphragm when compared to the CT scout image of 09/27/2016. No pneumothorax or  pleural effusion is seen. No significant pulmonary hemorrhage is noted. Minimal streakiness in the right mid upper lung field is unchanged when compared to the prior CT. Mediastinal and hilar contours are unremarkable. The heart is within normal limits in size.  IMPRESSION: 1. No pneumothorax or pleural effusion. No significant pulmonary hemorrhage. 2. Stable chronic elevation of the right hemidiaphragm.   Electronically Signed   By: Ivar Drape M.D.   On: 10/25/2016 08:49 I personally reviewed the CXR and concur with the findings noted above  Impression: [name] is a 71 year old man with EGFR positive adenocarcinoma being treated with Tarceva. He is having good response and has had no progressive disease. He is tolerating the treatment well.  He is being followed closely by Dr. [name].  I will plan to see him back any year to check on his progress.   Melrose Nakayama, MD [facility] [phone]

## 2016-10-27 MED FILL — TARCEVA 100 MG TABLET: 100 | 30 days supply | Qty: 30 | Fill #1

## 2016-11-01 ENCOUNTER — Ambulatory Visit: Payer: Medicare Other | Admitting: Internal Medicine

## 2016-11-01 ENCOUNTER — Other Ambulatory Visit: Payer: Medicare Other

## 2016-11-07 NOTE — Telephone Encounter (Signed)
.  Oral Oncology Patient Advocate Encounter  Received notification from [name] Patient Assistance program that patient has been successfully enrolled into their program to receive Tarceva from the manufacturer at $0 out of pocket cost.  He will remain enrolled in the program as long as he is prescribed Tarceva or unless his income/insurance status changes.    I called and left a message for the patient with the good news.  I also provided him with [employer]'s contact number so that shipment of his medication can be arranged.    I advised the patient to call the office with questions or concerns.   Gilmore Laroche, CPhT, [place] Oral Oncology Patient Advocate [phone] 11/07/2016 9:47 AM

## 2016-11-09 ENCOUNTER — Ambulatory Visit (HOSPITAL_BASED_OUTPATIENT_CLINIC_OR_DEPARTMENT_OTHER): Payer: Medicare Other | Admitting: Internal Medicine

## 2016-11-09 ENCOUNTER — Encounter: Payer: Self-pay | Admitting: Internal Medicine

## 2016-11-09 ENCOUNTER — Other Ambulatory Visit (HOSPITAL_BASED_OUTPATIENT_CLINIC_OR_DEPARTMENT_OTHER): Payer: Medicare Other

## 2016-11-09 ENCOUNTER — Telehealth: Payer: Self-pay | Admitting: Internal Medicine

## 2016-11-09 VITALS — BP 138/80 | HR 64 | Temp 97.8°F | Resp 20 | Ht 73.0 in | Wt 238.8 lb

## 2016-11-09 DIAGNOSIS — E039 Hypothyroidism, unspecified: Secondary | ICD-10-CM

## 2016-11-09 DIAGNOSIS — C3491 Malignant neoplasm of unspecified part of right bronchus or lung: Secondary | ICD-10-CM

## 2016-11-09 DIAGNOSIS — Z5111 Encounter for antineoplastic chemotherapy: Secondary | ICD-10-CM

## 2016-11-09 DIAGNOSIS — C3431 Malignant neoplasm of lower lobe, right bronchus or lung: Secondary | ICD-10-CM

## 2016-11-09 DIAGNOSIS — I10 Essential (primary) hypertension: Secondary | ICD-10-CM

## 2016-11-09 LAB — COMPREHENSIVE METABOLIC PANEL
ALT: 26 U/L (ref 0–55)
AST: 28 U/L (ref 5–34)
Albumin: 3.9 g/dL (ref 3.5–5.0)
Alkaline Phosphatase: 56 U/L (ref 40–150)
Anion Gap: 8 mEq/L (ref 3–11)
BUN: 13.9 mg/dL (ref 7.0–26.0)
CO2: 28 meq/L (ref 22–29)
Calcium: 9.8 mg/dL (ref 8.4–10.4)
Chloride: 104 mEq/L (ref 98–109)
Creatinine: 1.1 mg/dL (ref 0.7–1.3)
EGFR: 69 mL/min/{1.73_m2} — AB (ref >90)
Glucose: 104 mg/dl (ref 70–140)
POTASSIUM: 3.7 meq/L (ref 3.5–5.1)
SODIUM: 139 meq/L (ref 136–145)
Total Bilirubin: 0.43 mg/dL (ref 0.20–1.20)
Total Protein: 6.7 g/dL (ref 6.4–8.3)

## 2016-11-09 LAB — CBC WITH DIFFERENTIAL/PLATELET
BASO%: 0.6 % (ref 0.0–2.0)
Basophils Absolute: 0 10*3/uL (ref 0.0–0.1)
EOS%: 3.5 % (ref 0.0–7.0)
Eosinophils Absolute: 0.2 10*3/uL (ref 0.0–0.5)
HCT: 38.3 % — ABNORMAL LOW (ref 38.4–49.9)
HGB: 12.6 g/dL — ABNORMAL LOW (ref 13.0–17.1)
LYMPH%: 38.9 % (ref 14.0–49.0)
MCH: 31.3 pg (ref 27.2–33.4)
MCHC: 32.9 g/dL (ref 32.0–36.0)
MCV: 95.3 fL (ref 79.3–98.0)
MONO#: 0.6 10*3/uL (ref 0.1–0.9)
MONO%: 11.7 % (ref 0.0–14.0)
NEUT#: 2.5 10*3/uL (ref 1.5–6.5)
NEUT%: 45.3 % (ref 39.0–75.0)
Platelets: 212 10*3/uL (ref 140–400)
RBC: 4.02 10*6/uL — AB (ref 4.20–5.82)
RDW: 14 % (ref 11.0–14.6)
WBC: 5.5 10*3/uL (ref 4.0–10.3)
lymph#: 2.1 10*3/uL (ref 0.9–3.3)

## 2016-11-09 NOTE — Progress Notes (Signed)
[facility] [phone]) [phone]   [phone]  OFFICE PROGRESS NOTE  [name], MD 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  PRIOR THERAPY: 1) Status post right upper lobectomy under the care of Dr. Roxan Hockey on 01/23/2006.  2) Tarceva 150 mg by mouth daily as part of the BMS checkmate 370 clinical trial. Status post 6 weeks of treatment.  CURRENT THERAPY: Tarceva 100 mg by mouth daily as part of the BMS Checkmate 370 clinical trial. First dose started 07/17/2014 status post 29 months of treatment.  INTERVAL HISTORY: [name] 71 y.o. male returns to the clinic today for follow-up visit accompanied by his wife. The patient is feeling fine today with no specific complaints except for some mucous in his throat with choking sensation. He denied having any chest pain, shortness of breath, cough or hemoptysis. He denied having any fever or chills. He has no nausea, vomiting, diarrhea or constipation. He has no significant weight loss or night sweats. He continues to tolerate his treatment with Tarceva fairly well. He is here today for evaluation and repeat blood work.  MEDICAL HISTORY: Past Medical History:  Diagnosis Date  . Cholelithiases 07/16/2015  . Complication of anesthesia 2007   quit breathing with bronchoscopy, had to spend night  . History of agent Orange exposure 44-45 yrs ago  . History of pneumonia  last 3-4 yrs ago   3 -4 different times  . Hypercholesterolemia   . Hypertension   . Hypothyroidism   . lung ca dx'd 2007   lung right  . Sinus disease    hx of  . Sleep apnea    uses cpap setting opf 12    ALLERGIES:  is allergic to lisinopril and prednisone.  MEDICATIONS:  Current Outpatient Prescriptions  Medication Sig Dispense Refill  .  amitriptyline (ELAVIL) 50 MG tablet Take 50 mg by mouth at bedtime. Reported on 06/18/2015    . aspirin 81 MG tablet Take 81 mg by mouth every morning.     . Cholecalciferol 2000 units CAPS Take 2,000 Units by mouth daily.     . clindamycin (CLEOCIN-T) 1 % external solution Apply 1 application topically 2 (two) times daily as needed (for skin irritation). 240 mL 0  . clindamycin (CLINDAGEL) 1 % gel Apply topically 2 (two) times daily. 30 g 0  . diltiazem (CARDIZEM CD) 360 MG 24 hr capsule Take 360 mg by mouth every morning.     . erlotinib (TARCEVA) 100 MG tablet Take 1 tablet (100 mg total) by mouth daily. Take on an empty stomach 1 hour before meals or 2 hours after. 30 tablet 2  . levETIRAcetam (KEPPRA) 750 MG tablet Take 750 mg by mouth at bedtime.     . metoCLOPramide (REGLAN) 10 MG tablet Take 10 mg by mouth every 6 (six) hours as needed for nausea.     . metoprolol succinate (TOPROL-XL) 25 MG 24 hr tablet Take 25 mg by mouth every morning.     . Multiple Vitamins-Minerals (CENTRUM SILVER PO) Take 1 tablet by mouth daily.     . naproxen sodium (ANAPROX) 550 MG tablet Take 550 mg by mouth 3 (three) times daily as needed for headache (or pain). Reported on 08/27/2015    . nitroGLYCERIN (NITROSTAT) 0.4 MG SL tablet Place  0.4 mg under the tongue every 5 (five) minutes as needed for chest pain. Reported on 10/22/2015    . polyethylene glycol (MIRALAX / GLYCOLAX) packet Take 17 g by mouth daily. Reported on 08/27/2015    . psyllium (METAMUCIL) 58.6 % powder Take 1 packet by mouth daily. Reported on 08/27/2015    . ranitidine (ZANTAC) 75 MG tablet Take 75 mg by mouth 2 (two) times daily.    . rosuvastatin (CRESTOR) 5 MG tablet Take 5 mg by mouth every morning.     [name] SYNTHROID 112 MCG tablet Take 112 mcg by mouth daily.    . valsartan-hydrochlorothiazide (DIOVAN-HCT) 320-25 MG per tablet Take 1 tablet by mouth every morning.      No current facility-administered medications for this visit.      SURGICAL HISTORY:  Past Surgical History:  Procedure Laterality Date  . COLONOSCOPY WITH PROPOFOL N/A 03/19/2013   Procedure: COLONOSCOPY WITH PROPOFOL;  Surgeon: Garlan Fair, MD;  Location: [facility];  Service: Endoscopy;  Laterality: N/A;  . Cystourethroscopy, [facility].  04/16/2010  . NASAL SINUS SURGERY  1987  . Right video-assisted thoracoscopy, right upper lobectomy and  01/23/2006  . The Olympus video bronchoscope was introduced via the right  12/21/2005  . TONSILLECTOMY  1957  . Torn medial and lateral menisci, left knee.  11/06/2006    REVIEW OF SYSTEMS:  A comprehensive review of systems was negative.   PHYSICAL EXAMINATION: General appearance: alert, cooperative and no distress Head: Normocephalic, without obvious abnormality, atraumatic Neck: no adenopathy, no JVD, supple, symmetrical, trachea midline and thyroid not enlarged, symmetric, no tenderness/mass/nodules Lymph nodes: Cervical, supraclavicular, and axillary nodes normal. Resp: clear to auscultation bilaterally Back: symmetric, no curvature. ROM normal. No CVA tenderness. Cardio: regular rate and rhythm, S1, S2 normal, no murmur, click, rub or gallop GI: soft, non-tender; bowel sounds normal; no masses,  no organomegaly Extremities: extremities normal, atraumatic, no cyanosis or edema  ECOG PERFORMANCE STATUS: 0 - Asymptomatic  Blood pressure 138/80, pulse 64, temperature 97.8 F (36.6 C), temperature source Oral, resp. rate 20, height 6' 1" (1.854 m), weight 238 lb 12.8 oz (108.3 kg), SpO2 100 %.  LABORATORY DATA: Lab Results  Component Value Date   WBC 5.5 11/09/2016   HGB 12.6 (L) 11/09/2016   HCT 38.3 (L) 11/09/2016   MCV 95.3 11/09/2016   PLT 212 11/09/2016      Chemistry      Component Value Date/Time   NA 141 09/27/2016 1316   K 3.9 09/27/2016 1316   CL 98 (L) 05/12/2016 1602   CL 102 04/24/2012 0853   CO2 31 (H) 09/27/2016 1316   BUN 13.5 09/27/2016 1316   CREATININE 1.0  09/27/2016 1316      Component Value Date/Time   CALCIUM 10.0 09/27/2016 1316   ALKPHOS 55 09/27/2016 1316   AST 31 09/27/2016 1316   ALT 29 09/27/2016 1316   BILITOT 0.43 09/27/2016 1316       RADIOGRAPHIC STUDIES: Dg Chest 2 View  Result Date: 10/25/2016 CLINICAL DATA:  History of enlarging right lung nodules in patient with history of right lung carcinoma, post biopsy EXAM: CHEST  2 VIEW COMPARISON:  CT chest of 09/27/2016 FINDINGS: There is persistent chronic elevation of the right hemidiaphragm when compared to the CT scout image of 09/27/2016. No pneumothorax or pleural effusion is seen. No significant pulmonary hemorrhage is noted. Minimal streakiness in the right mid upper lung field is unchanged when compared to the prior CT. Mediastinal  and hilar contours are unremarkable. The heart is within normal limits in size. IMPRESSION: 1. No pneumothorax or pleural effusion. No significant pulmonary hemorrhage. 2. Stable chronic elevation of the right hemidiaphragm. Electronically Signed   By: Ivar Drape M.D.   On: 10/25/2016 08:49    ASSESSMENT AND PLAN:  This is a very pleasant 71 years old white male with recurrent non-small cell lung cancer, adenocarcinoma and positive EGFR mutation in exon 21. He is currently on Tarceva 100 mg by mouth daily status post 29 months of treatment and has been tolerating the treatment fairly well. The patient continues to tolerate his treatment fairly well with no significant adverse effects. I recommended for him to continue his current treatment with Tarceva with the same dose. I will see him back for follow-up visit in one month's for evaluation and repeat blood work. He was advised to call immediately if he has any concerning symptoms in the interval. The patient voices understanding of current disease status and treatment options and is in agreement with the current care plan. All questions were answered. The patient knows to call the clinic with any  problems, questions or concerns. We can certainly see the patient much sooner if necessary.  Disclaimer: This note was dictated with voice recognition software. Similar sounding words can inadvertently be transcribed and may not be corrected upon review.

## 2016-11-09 NOTE — Telephone Encounter (Signed)
Scheduled appt per 8/8 los - Gave patient AVS and calender per los. 

## 2016-12-13 ENCOUNTER — Other Ambulatory Visit: Payer: Medicare Other

## 2016-12-13 ENCOUNTER — Ambulatory Visit: Payer: Medicare Other | Admitting: Internal Medicine

## 2016-12-15 ENCOUNTER — Encounter: Payer: Self-pay | Admitting: Internal Medicine

## 2016-12-15 ENCOUNTER — Encounter: Payer: Self-pay | Admitting: Oncology

## 2016-12-15 ENCOUNTER — Telehealth: Payer: Self-pay | Admitting: Internal Medicine

## 2016-12-15 ENCOUNTER — Other Ambulatory Visit (HOSPITAL_BASED_OUTPATIENT_CLINIC_OR_DEPARTMENT_OTHER): Payer: Medicare Other

## 2016-12-15 ENCOUNTER — Ambulatory Visit (HOSPITAL_BASED_OUTPATIENT_CLINIC_OR_DEPARTMENT_OTHER): Payer: Medicare Other | Admitting: Internal Medicine

## 2016-12-15 VITALS — BP 119/71 | HR 69 | Temp 97.9°F | Resp 17 | Ht 73.0 in | Wt 238.5 lb

## 2016-12-15 DIAGNOSIS — Z23 Encounter for immunization: Secondary | ICD-10-CM

## 2016-12-15 DIAGNOSIS — C3431 Malignant neoplasm of lower lobe, right bronchus or lung: Secondary | ICD-10-CM

## 2016-12-15 DIAGNOSIS — Z5111 Encounter for antineoplastic chemotherapy: Secondary | ICD-10-CM

## 2016-12-15 DIAGNOSIS — C3411 Malignant neoplasm of upper lobe, right bronchus or lung: Secondary | ICD-10-CM

## 2016-12-15 LAB — COMPREHENSIVE METABOLIC PANEL
ALT: 33 U/L (ref 0–55)
ANION GAP: 8 meq/L (ref 3–11)
AST: 32 U/L (ref 5–34)
Albumin: 4 g/dL (ref 3.5–5.0)
Alkaline Phosphatase: 59 U/L (ref 40–150)
BILIRUBIN TOTAL: 0.34 mg/dL (ref 0.20–1.20)
BUN: 17.3 mg/dL (ref 7.0–26.0)
CO2: 25 meq/L (ref 22–29)
Calcium: 9.8 mg/dL (ref 8.4–10.4)
Chloride: 105 mEq/L (ref 98–109)
Creatinine: 1.1 mg/dL (ref 0.7–1.3)
EGFR: 70 mL/min/{1.73_m2} — ABNORMAL LOW (ref >90)
GLUCOSE: 98 mg/dL (ref 70–140)
Potassium: 3.8 mEq/L (ref 3.5–5.1)
SODIUM: 138 meq/L (ref 136–145)
TOTAL PROTEIN: 6.9 g/dL (ref 6.4–8.3)

## 2016-12-15 LAB — CBC WITH DIFFERENTIAL/PLATELET
BASO%: 0.6 % (ref 0.0–2.0)
Basophils Absolute: 0 10*3/uL (ref 0.0–0.1)
EOS%: 3.1 % (ref 0.0–7.0)
Eosinophils Absolute: 0.2 10*3/uL (ref 0.0–0.5)
HCT: 38.1 % — ABNORMAL LOW (ref 38.4–49.9)
HEMOGLOBIN: 12.8 g/dL — AB (ref 13.0–17.1)
LYMPH%: 35.7 % (ref 14.0–49.0)
MCH: 31.9 pg (ref 27.2–33.4)
MCHC: 33.7 g/dL (ref 32.0–36.0)
MCV: 94.7 fL (ref 79.3–98.0)
MONO#: 0.6 10*3/uL (ref 0.1–0.9)
MONO%: 11.3 % (ref 0.0–14.0)
NEUT#: 2.8 10*3/uL (ref 1.5–6.5)
NEUT%: 49.3 % (ref 39.0–75.0)
PLATELETS: 236 10*3/uL (ref 140–400)
RBC: 4.02 10*6/uL — AB (ref 4.20–5.82)
RDW: 14.3 % (ref 11.0–14.6)
WBC: 5.7 10*3/uL (ref 4.0–10.3)
lymph#: 2 10*3/uL (ref 0.9–3.3)

## 2016-12-15 MED ORDER — INFLUENZA VAC SPLIT QUAD 0.5 ML IM SUSY
0.5000 mL | PREFILLED_SYRINGE | Freq: Once | INTRAMUSCULAR | Status: AC
  Administered 2016-12-15: 0.5 mL via INTRAMUSCULAR
  Filled 2016-12-15: qty 0.5

## 2016-12-15 NOTE — Progress Notes (Signed)
12/15/16 - BMS [id number] - Questionnaire (PRO) - Patient into the cancer this morning for routine visit.  Patient given PRO upon arrival to the cancer center. Patient completed the PRO.  Patient was thanked for his continued support in this clinical trial. Remer Macho 12/15/16 - 8:35 am

## 2016-12-15 NOTE — Telephone Encounter (Signed)
Gave avs and calendar for october °

## 2016-12-15 NOTE — Progress Notes (Signed)
[facility] [phone]) [phone]   [phone]) [phone]  OFFICE PROGRESS NOTE  [name], MD 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  PRIOR THERAPY: 1) Status post right upper lobectomy under the care of Dr. Roxan Hockey on 01/23/2006.  2) Tarceva 150 mg by mouth daily as part of the BMS checkmate 370 clinical trial. Status post 6 weeks of treatment.  CURRENT THERAPY: Tarceva 100 mg by mouth daily as part of the BMS Checkmate 370 clinical trial. First dose started 07/17/2014 status post 30 months of treatment.  INTERVAL HISTORY: [name] 71 y.o. male returns to the clinic today for follow-up visit accompanied by his wife. The patient is feeling fine today with no specific complaints. He denied having any chest pain, shortness of breath, cough or hemoptysis. He denied having any fever or chills. He has no nausea, vomiting, diarrhea or constipation. He denied having any recent weight loss or night sweats. The patient is here today for evaluation and repeat blood work.  MEDICAL HISTORY: Past Medical History:  Diagnosis Date  . Cholelithiases 07/16/2015  . Complication of anesthesia 2007   quit breathing with bronchoscopy, had to spend night  . History of agent Orange exposure 44-45 yrs ago  . History of pneumonia  last 3-4 yrs ago   3 -4 different times  . Hypercholesterolemia   . Hypertension   . Hypothyroidism   . lung ca dx'd 2007   lung right  . Sinus disease    hx of  . Sleep apnea    uses cpap setting opf 12    ALLERGIES:  is allergic to lisinopril and prednisone.  MEDICATIONS:  Current Outpatient Prescriptions  Medication Sig Dispense Refill  . amitriptyline (ELAVIL) 50 MG tablet Take 50 mg by mouth at bedtime. Reported on 06/18/2015    . aspirin 81 MG  tablet Take 81 mg by mouth every morning.     . Cholecalciferol 2000 units CAPS Take 2,000 Units by mouth daily.     . clindamycin (CLEOCIN-T) 1 % external solution Apply 1 application topically 2 (two) times daily as needed (for skin irritation). 240 mL 0  . clindamycin (CLINDAGEL) 1 % gel Apply topically 2 (two) times daily. 30 g 0  . diltiazem (CARDIZEM CD) 360 MG 24 hr capsule Take 360 mg by mouth every morning.     . erlotinib (TARCEVA) 100 MG tablet Take 1 tablet (100 mg total) by mouth daily. Take on an empty stomach 1 hour before meals or 2 hours after. 30 tablet 2  . levETIRAcetam (KEPPRA) 750 MG tablet Take 750 mg by mouth at bedtime.     . metoCLOPramide (REGLAN) 10 MG tablet Take 10 mg by mouth every 6 (six) hours as needed for nausea.     . metoprolol succinate (TOPROL-XL) 25 MG 24 hr tablet Take 25 mg by mouth every morning.     . Multiple Vitamins-Minerals (CENTRUM SILVER PO) Take 1 tablet by mouth daily.     . naproxen sodium (ANAPROX) 550 MG tablet Take 550 mg by mouth 3 (three) times daily as needed for headache (or pain). Reported on 08/27/2015    . nitroGLYCERIN (NITROSTAT) 0.4 MG SL tablet Place 0.4 mg under the tongue every 5 (five) minutes as needed for chest pain. Reported on 10/22/2015    .  polyethylene glycol (MIRALAX / GLYCOLAX) packet Take 17 g by mouth daily. Reported on 08/27/2015    . psyllium (METAMUCIL) 58.6 % powder Take 1 packet by mouth daily. Reported on 08/27/2015    . ranitidine (ZANTAC) 75 MG tablet Take 75 mg by mouth 2 (two) times daily.    . rosuvastatin (CRESTOR) 5 MG tablet Take 5 mg by mouth every morning.     [name] SYNTHROID 112 MCG tablet Take 112 mcg by mouth daily.    . valsartan-hydrochlorothiazide (DIOVAN-HCT) 320-25 MG per tablet Take 1 tablet by mouth every morning.      Current Facility-Administered Medications  Medication Dose Route Frequency Provider Last Rate Last Dose  . Influenza vac split quadrivalent PF (FLUARIX) injection 0.5 mL  0.5 mL  Intramuscular Once Curt Bears, MD        SURGICAL HISTORY:  Past Surgical History:  Procedure Laterality Date  . COLONOSCOPY WITH PROPOFOL N/A 03/19/2013   Procedure: COLONOSCOPY WITH PROPOFOL;  Surgeon: Garlan Fair, MD;  Location: [facility];  Service: Endoscopy;  Laterality: N/A;  . Cystourethroscopy, [facility].  04/16/2010  . NASAL SINUS SURGERY  1987  . Right video-assisted thoracoscopy, right upper lobectomy and  01/23/2006  . The Olympus video bronchoscope was introduced via the right  12/21/2005  . TONSILLECTOMY  1957  . Torn medial and lateral menisci, left knee.  11/06/2006    REVIEW OF SYSTEMS:  A comprehensive review of systems was negative.   PHYSICAL EXAMINATION: General appearance: alert, cooperative and no distress Head: Normocephalic, without obvious abnormality, atraumatic Neck: no adenopathy, no JVD, supple, symmetrical, trachea midline and thyroid not enlarged, symmetric, no tenderness/mass/nodules Lymph nodes: Cervical, supraclavicular, and axillary nodes normal. Resp: clear to auscultation bilaterally Back: symmetric, no curvature. ROM normal. No CVA tenderness. Cardio: regular rate and rhythm, S1, S2 normal, no murmur, click, rub or gallop GI: soft, non-tender; bowel sounds normal; no masses,  no organomegaly Extremities: extremities normal, atraumatic, no cyanosis or edema  ECOG PERFORMANCE STATUS: 0 - Asymptomatic  Blood pressure 119/71, pulse 69, temperature 97.9 F (36.6 C), temperature source Oral, resp. rate 17, height '6\' 1"'  (1.854 m), weight 238 lb 8 oz (108.2 kg), SpO2 98 %.  LABORATORY DATA: Lab Results  Component Value Date   WBC 5.7 12/15/2016   HGB 12.8 (L) 12/15/2016   HCT 38.1 (L) 12/15/2016   MCV 94.7 12/15/2016   PLT 236 12/15/2016      Chemistry      Component Value Date/Time   NA 138 12/15/2016 0826   K 3.8 12/15/2016 0826   CL 98 (L) 05/12/2016 1602   CL 102 04/24/2012 0853   CO2 25 12/15/2016 0826   BUN 17.3  12/15/2016 0826   CREATININE 1.1 12/15/2016 0826      Component Value Date/Time   CALCIUM 9.8 12/15/2016 0826   ALKPHOS 59 12/15/2016 0826   AST 32 12/15/2016 0826   ALT 33 12/15/2016 0826   BILITOT 0.34 12/15/2016 0826       RADIOGRAPHIC STUDIES: No results found.  ASSESSMENT AND PLAN:  This is a very pleasant 71 years old white male with recurrent non-small cell lung cancer, adenocarcinoma and positive EGFR mutation in exon 21. He is currently on Tarceva 100 mg by mouth daily status post 30 months of treatment and has been tolerating the treatment fairly well. I recommended for the patient to continue his treatment with Tarceva with the same dose. I will see him back for follow-up visit in one month's  for evaluation after repeating CT scan of the chest, abdomen and pelvis for restaging of his disease. The patient was advised to call immediately if he has any concerning symptoms in the interval. The patient voices understanding of current disease status and treatment options and is in agreement with the current care plan. All questions were answered. The patient knows to call the clinic with any problems, questions or concerns. We can certainly see the patient much sooner if necessary.  Disclaimer: This note was dictated with voice recognition software. Similar sounding words can inadvertently be transcribed and may not be corrected upon review.

## 2017-01-10 ENCOUNTER — Ambulatory Visit (HOSPITAL_COMMUNITY)
Admission: RE | Admit: 2017-01-10 | Discharge: 2017-01-10 | Disposition: A | Discharge status: Home / Self Care | Payer: Medicare Other | Source: Ambulatory Visit | Attending: Internal Medicine | Admitting: Internal Medicine

## 2017-01-10 ENCOUNTER — Encounter (HOSPITAL_COMMUNITY): Payer: Self-pay

## 2017-01-10 DIAGNOSIS — I700 Atherosclerosis of aorta: Secondary | ICD-10-CM | POA: Insufficient documentation

## 2017-01-10 DIAGNOSIS — C3431 Malignant neoplasm of lower lobe, right bronchus or lung: Secondary | ICD-10-CM | POA: Diagnosis not present

## 2017-01-10 DIAGNOSIS — R911 Solitary pulmonary nodule: Secondary | ICD-10-CM | POA: Diagnosis not present

## 2017-01-10 DIAGNOSIS — Z23 Encounter for immunization: Secondary | ICD-10-CM

## 2017-01-10 DIAGNOSIS — K8020 Calculus of gallbladder without cholecystitis without obstruction: Secondary | ICD-10-CM | POA: Insufficient documentation

## 2017-01-10 DIAGNOSIS — Z5111 Encounter for antineoplastic chemotherapy: Secondary | ICD-10-CM

## 2017-01-10 MED ORDER — IOPAMIDOL (ISOVUE-300) INJECTION 61%
INTRAVENOUS | Status: AC
  Administered 2017-01-10: 100 mL via INTRAVENOUS
  Filled 2017-01-10: qty 100

## 2017-01-10 MED ORDER — IOPAMIDOL (ISOVUE-300) INJECTION 61%
100.0000 mL | Freq: Once | INTRAVENOUS | Status: AC | PRN
  Administered 2017-01-10: 100 mL via INTRAVENOUS

## 2017-01-12 ENCOUNTER — Encounter: Payer: Self-pay | Admitting: Internal Medicine

## 2017-01-12 ENCOUNTER — Ambulatory Visit (HOSPITAL_BASED_OUTPATIENT_CLINIC_OR_DEPARTMENT_OTHER): Payer: Medicare Other | Admitting: Internal Medicine

## 2017-01-12 ENCOUNTER — Telehealth: Payer: Self-pay | Admitting: Internal Medicine

## 2017-01-12 ENCOUNTER — Other Ambulatory Visit (HOSPITAL_BASED_OUTPATIENT_CLINIC_OR_DEPARTMENT_OTHER): Payer: Medicare Other

## 2017-01-12 VITALS — BP 121/78 | HR 67 | Temp 97.5°F | Resp 18 | Ht 73.0 in | Wt 237.9 lb

## 2017-01-12 DIAGNOSIS — C3411 Malignant neoplasm of upper lobe, right bronchus or lung: Secondary | ICD-10-CM

## 2017-01-12 DIAGNOSIS — D649 Anemia, unspecified: Secondary | ICD-10-CM | POA: Diagnosis not present

## 2017-01-12 DIAGNOSIS — D485 Neoplasm of uncertain behavior of skin: Secondary | ICD-10-CM | POA: Diagnosis not present

## 2017-01-12 DIAGNOSIS — E039 Hypothyroidism, unspecified: Secondary | ICD-10-CM | POA: Insufficient documentation

## 2017-01-12 DIAGNOSIS — Z23 Encounter for immunization: Secondary | ICD-10-CM

## 2017-01-12 DIAGNOSIS — C3431 Malignant neoplasm of lower lobe, right bronchus or lung: Secondary | ICD-10-CM

## 2017-01-12 DIAGNOSIS — Z5111 Encounter for antineoplastic chemotherapy: Secondary | ICD-10-CM

## 2017-01-12 DIAGNOSIS — I10 Essential (primary) hypertension: Secondary | ICD-10-CM

## 2017-01-12 DIAGNOSIS — L905 Scar conditions and fibrosis of skin: Secondary | ICD-10-CM | POA: Diagnosis not present

## 2017-01-12 DIAGNOSIS — D225 Melanocytic nevi of trunk: Secondary | ICD-10-CM | POA: Diagnosis not present

## 2017-01-12 DIAGNOSIS — L821 Other seborrheic keratosis: Secondary | ICD-10-CM | POA: Diagnosis not present

## 2017-01-12 LAB — CBC WITH DIFFERENTIAL/PLATELET
BASO%: 0.4 % (ref 0.0–2.0)
BASOS ABS: 0 10*3/uL (ref 0.0–0.1)
EOS ABS: 0.1 10*3/uL (ref 0.0–0.5)
EOS%: 3 % (ref 0.0–7.0)
HCT: 35.7 % — ABNORMAL LOW (ref 38.4–49.9)
HGB: 11.8 g/dL — ABNORMAL LOW (ref 13.0–17.1)
LYMPH%: 38.8 % (ref 14.0–49.0)
MCH: 31.6 pg (ref 27.2–33.4)
MCHC: 33.1 g/dL (ref 32.0–36.0)
MCV: 95.5 fL (ref 79.3–98.0)
MONO#: 0.6 10*3/uL (ref 0.1–0.9)
MONO%: 13.5 % (ref 0.0–14.0)
NEUT#: 2.1 10*3/uL (ref 1.5–6.5)
NEUT%: 44.3 % (ref 39.0–75.0)
PLATELETS: 208 10*3/uL (ref 140–400)
RBC: 3.74 10*6/uL — AB (ref 4.20–5.82)
RDW: 14 % (ref 11.0–14.6)
WBC: 4.7 10*3/uL (ref 4.0–10.3)
lymph#: 1.8 10*3/uL (ref 0.9–3.3)

## 2017-01-12 LAB — COMPREHENSIVE METABOLIC PANEL
ALBUMIN: 3.8 g/dL (ref 3.5–5.0)
ALK PHOS: 59 U/L (ref 40–150)
ALT: 32 U/L (ref 0–55)
AST: 32 U/L (ref 5–34)
Anion Gap: 8 mEq/L (ref 3–11)
BUN: 14.1 mg/dL (ref 7.0–26.0)
CHLORIDE: 105 meq/L (ref 98–109)
CO2: 27 mEq/L (ref 22–29)
Calcium: 9.1 mg/dL (ref 8.4–10.4)
Creatinine: 1 mg/dL (ref 0.7–1.3)
GLUCOSE: 94 mg/dL (ref 70–140)
POTASSIUM: 3.6 meq/L (ref 3.5–5.1)
SODIUM: 139 meq/L (ref 136–145)
Total Bilirubin: 0.26 mg/dL (ref 0.20–1.20)
Total Protein: 6.4 g/dL (ref 6.4–8.3)

## 2017-01-12 NOTE — Progress Notes (Signed)
[place] [phone]   [phone]  OFFICE PROGRESS NOTE  [name], MD 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  PRIOR THERAPY: 1) Status post right upper lobectomy under the care of Dr. Roxan Hockey on 01/23/2006.  2) Tarceva 150 mg by mouth daily as part of the BMS checkmate 370 clinical trial. Status post 6 weeks of treatment.  CURRENT THERAPY: Tarceva 100 mg by mouth daily as part of the BMS Checkmate 370 clinical trial. First dose started 07/17/2014 status post 31 months of treatment.  INTERVAL HISTORY: [name] 71 y.o. male returns to the clinic today for follow-up visit accompanied by his wife. The patient is feeling fine today with no specific complaints except for occasional numbness in the right hand every morning results spontaneously an hour later. It started in the last 2 weeks. He denied having any chest pain, shortness of breath, cough or hemoptysis. He denied having any fever or chills. He has no significant weight loss or night sweats. The patient has no nausea, vomiting, diarrhea or constipation. He denied having any significant skin rash. He continues to tolerate his treatment with Tarceva fairly well.   MEDICAL HISTORY: Past Medical History:  Diagnosis Date  . Cholelithiases 07/16/2015  . Complication of anesthesia 2007   quit breathing with bronchoscopy, had to spend night  . History of agent Orange exposure 44-45 yrs ago  . History of pneumonia  last 3-4 yrs ago   3 -4 different times  . Hypercholesterolemia   . Hypertension   . Hypothyroidism   . lung ca dx'd 2007   lung right  . Sinus disease    hx of  . Sleep apnea    uses cpap setting opf 12    ALLERGIES:  is allergic to lisinopril and prednisone.  MEDICATIONS:    Current Outpatient Prescriptions  Medication Sig Dispense Refill  . amitriptyline (ELAVIL) 50 MG tablet Take 50 mg by mouth at bedtime. Reported on 06/18/2015    . aspirin 81 MG tablet Take 81 mg by mouth every morning.     . Cholecalciferol 2000 units CAPS Take 2,000 Units by mouth daily.     . clindamycin (CLEOCIN-T) 1 % external solution Apply 1 application topically 2 (two) times daily as needed (for skin irritation). 240 mL 0  . clindamycin (CLINDAGEL) 1 % gel Apply topically 2 (two) times daily. 30 g 0  . diltiazem (CARDIZEM CD) 360 MG 24 hr capsule Take 360 mg by mouth every morning.     . erlotinib (TARCEVA) 100 MG tablet Take 1 tablet (100 mg total) by mouth daily. Take on an empty stomach 1 hour before meals or 2 hours after. 30 tablet 2  . levETIRAcetam (KEPPRA) 750 MG tablet Take 750 mg by mouth at bedtime.     . metoCLOPramide (REGLAN) 10 MG tablet Take 10 mg by mouth every 6 (six) hours as needed for nausea.     . metoprolol succinate (TOPROL-XL) 25 MG 24 hr tablet Take 25 mg by mouth every morning.     . Multiple Vitamins-Minerals (CENTRUM SILVER PO) Take 1 tablet by mouth daily.     . naproxen sodium (ANAPROX) 550 MG tablet Take 550 mg by mouth 3 (three) times daily as needed for headache (or pain). Reported on  08/27/2015    . polyethylene glycol (MIRALAX / GLYCOLAX) packet Take 17 g by mouth daily. Reported on 08/27/2015    . psyllium (METAMUCIL) 58.6 % powder Take 1 packet by mouth daily. Reported on 08/27/2015    . ranitidine (ZANTAC) 75 MG tablet Take 75 mg by mouth 2 (two) times daily.    . rosuvastatin (CRESTOR) 5 MG tablet Take 5 mg by mouth every morning.     [name] SYNTHROID 112 MCG tablet Take 112 mcg by mouth daily.    . valsartan-hydrochlorothiazide (DIOVAN-HCT) 320-25 MG per tablet Take 1 tablet by mouth every morning.     . nitroGLYCERIN (NITROSTAT) 0.4 MG SL tablet Place 0.4 mg under the tongue every 5 (five) minutes as needed for chest pain. Reported on 10/22/2015     No  current facility-administered medications for this visit.     SURGICAL HISTORY:  Past Surgical History:  Procedure Laterality Date  . COLONOSCOPY WITH PROPOFOL N/A 03/19/2013   Procedure: COLONOSCOPY WITH PROPOFOL;  Surgeon: Garlan Fair, MD;  Location: [facility];  Service: Endoscopy;  Laterality: N/A;  . Cystourethroscopy, [facility] T[facility].  04/16/2010  . NASAL SINUS SURGERY  1987  . Right video-assisted thoracoscopy, right upper lobectomy and  01/23/2006  . The Olympus video bronchoscope was introduced via the right  12/21/2005  . TONSILLECTOMY  1957  . Torn medial and lateral menisci, left knee.  11/06/2006    REVIEW OF SYSTEMS:  Constitutional: negative Eyes: negative Ears, nose, mouth, throat, and face: negative Respiratory: negative Cardiovascular: negative Gastrointestinal: negative Genitourinary:negative Integument/breast: negative Hematologic/lymphatic: negative Musculoskeletal:negative Neurological: positive for paresthesia Behavioral/Psych: negative Endocrine: negative Allergic/Immunologic: negative   PHYSICAL EXAMINATION: General appearance: alert, cooperative and no distress Head: Normocephalic, without obvious abnormality, atraumatic Neck: no adenopathy, no JVD, supple, symmetrical, trachea midline and thyroid not enlarged, symmetric, no tenderness/mass/nodules Lymph nodes: Cervical, supraclavicular, and axillary nodes normal. Resp: clear to auscultation bilaterally Back: symmetric, no curvature. ROM normal. No CVA tenderness. Cardio: regular rate and rhythm, S1, S2 normal, no murmur, click, rub or gallop GI: soft, non-tender; bowel sounds normal; no masses,  no organomegaly Extremities: extremities normal, atraumatic, no cyanosis or edema Neurologic: Alert and oriented X 3, normal strength and tone. Normal symmetric reflexes. Normal coordination and gait  ECOG PERFORMANCE STATUS: 0 - Asymptomatic  Blood pressure 121/78, pulse 67, temperature (!) 97.5 F  (36.4 C), temperature source Oral, resp. rate 18, height _0  (1.854 m), weight 237 lb 14.4 oz (107.9 kg), SpO2 99 %.  LABORATORY DATA: Lab Results  Component Value Date   WBC 4.7 01/12/2017   HGB 11.8 (L) 01/12/2017   HCT 35.7 (L) 01/12/2017   MCV 95.5 01/12/2017   PLT 208 01/12/2017      Chemistry      Component Value Date/Time   NA 139 01/12/2017 0856   K 3.6 01/12/2017 0856   CL 98 (L) 05/12/2016 1602   CL 102 04/24/2012 0853   CO2 27 01/12/2017 0856   BUN 14.1 01/12/2017 0856   CREATININE 1.0 01/12/2017 0856      Component Value Date/Time   CALCIUM 9.1 01/12/2017 0856   ALKPHOS 59 01/12/2017 0856   AST 32 01/12/2017 0856   ALT 32 01/12/2017 0856   BILITOT 0.26 01/12/2017 0856       RADIOGRAPHIC STUDIES: Ct Chest W Contrast  Result Date: 01/10/2017 CLINICAL DATA:  Recurrent lung adenocarcinoma initially diagnosed in 2007 status post right upper lobectomy with right lung recurrence diagnosed in 2016, presenting for restaging  with ongoing Tarceva therapy. EXAM: CT CHEST, ABDOMEN, AND PELVIS WITH CONTRAST TECHNIQUE: Multidetector CT imaging of the chest, abdomen and pelvis was performed following the standard protocol during bolus administration of intravenous contrast. CONTRAST:  100 cc Isovue-300 IV. COMPARISON:  09/27/2016 CT chest, abdomen and pelvis. FINDINGS: CT CHEST FINDINGS Cardiovascular: Normal heart size. No significant pericardial fluid/thickening. Mildly atherosclerotic thoracic aorta with stable ectatic 4.3 cm ascending thoracic aorta. Normal caliber pulmonary arteries. No central pulmonary emboli. Mediastinum/Nodes: Stable subcentimeter hypodense mid thyroid isthmus nodule. Unremarkable esophagus. No pathologically enlarged axillary, mediastinal or hilar lymph nodes. Lungs/Pleura: No pneumothorax. No pleural effusion. Status post right upper lobectomy. Anterior right lower lobe irregular subsolid 1.6 x 1.1 cm nodule (series 6/ image 57), previously 1.6 x 1.2 cm  on 09/27/2016 using similar measurement technique, unchanged. Subjacent solid 0.7 cm and 0.6 cm nodules in the periphery of the anterior right lower lobe (series 6/ images 62 and 61, respectively) are stable. No acute consolidative airspace disease, lung masses or new significant pulmonary nodules. Musculoskeletal: No aggressive appearing focal osseous lesions. Mild thoracic spondylosis. CT ABDOMEN PELVIS FINDINGS Hepatobiliary: Normal liver with no liver mass. Cholelithiasis, with no gallbladder wall thickening or pericholecystic fluid. No biliary ductal dilatation. Pancreas: Normal, with no mass or duct dilation. Spleen: Normal size. No mass. Adrenals/Urinary Tract: Normal adrenals. Simple exophytic 1.8 cm anterior upper right renal cyst. No additional renal lesions. No hydronephrosis. Normal bladder. Stomach/Bowel: Grossly normal stomach. Normal caliber small bowel with no small bowel wall thickening. Normal appendix. Normal large bowel with no diverticulosis, large bowel wall thickening or pericolonic fat stranding. Vascular/Lymphatic: Atherosclerotic nonaneurysmal abdominal aorta. Patent portal, splenic, hepatic and renal veins. No pathologically enlarged lymph nodes in the abdomen or pelvis. Reproductive: Top-normal size prostate. Other: No pneumoperitoneum, ascites or focal fluid collection. Musculoskeletal: No aggressive appearing focal osseous lesions. Moderate lumbar spondylosis. IMPRESSION: 1. Stable right lower lobe pulmonary nodularity. 2. No new or progressive metastatic disease in the chest, abdomen or pelvis. 3. Chronic findings include stable ectatic 4.3 cm ascending thoracic aorta, Aortic Atherosclerosis (ICD10-I70.0) and cholelithiasis. Electronically Signed   By: Ilona Sorrel M.D.   On: 01/10/2017 12:42   Ct Abdomen Pelvis W Contrast  Result Date: 01/10/2017 CLINICAL DATA:  Recurrent lung adenocarcinoma initially diagnosed in 2007 status post right upper lobectomy with right lung recurrence  diagnosed in 2016, presenting for restaging with ongoing Tarceva therapy. EXAM: CT CHEST, ABDOMEN, AND PELVIS WITH CONTRAST TECHNIQUE: Multidetector CT imaging of the chest, abdomen and pelvis was performed following the standard protocol during bolus administration of intravenous contrast. CONTRAST:  100 cc Isovue-300 IV. COMPARISON:  09/27/2016 CT chest, abdomen and pelvis. FINDINGS: CT CHEST FINDINGS Cardiovascular: Normal heart size. No significant pericardial fluid/thickening. Mildly atherosclerotic thoracic aorta with stable ectatic 4.3 cm ascending thoracic aorta. Normal caliber pulmonary arteries. No central pulmonary emboli. Mediastinum/Nodes: Stable subcentimeter hypodense mid thyroid isthmus nodule. Unremarkable esophagus. No pathologically enlarged axillary, mediastinal or hilar lymph nodes. Lungs/Pleura: No pneumothorax. No pleural effusion. Status post right upper lobectomy. Anterior right lower lobe irregular subsolid 1.6 x 1.1 cm nodule (series 6/ image 57), previously 1.6 x 1.2 cm on 09/27/2016 using similar measurement technique, unchanged. Subjacent solid 0.7 cm and 0.6 cm nodules in the periphery of the anterior right lower lobe (series 6/ images 62 and 61, respectively) are stable. No acute consolidative airspace disease, lung masses or new significant pulmonary nodules. Musculoskeletal: No aggressive appearing focal osseous lesions. Mild thoracic spondylosis. CT ABDOMEN PELVIS FINDINGS Hepatobiliary: Normal liver with no  liver mass. Cholelithiasis, with no gallbladder wall thickening or pericholecystic fluid. No biliary ductal dilatation. Pancreas: Normal, with no mass or duct dilation. Spleen: Normal size. No mass. Adrenals/Urinary Tract: Normal adrenals. Simple exophytic 1.8 cm anterior upper right renal cyst. No additional renal lesions. No hydronephrosis. Normal bladder. Stomach/Bowel: Grossly normal stomach. Normal caliber small bowel with no small bowel wall thickening. Normal appendix.  Normal large bowel with no diverticulosis, large bowel wall thickening or pericolonic fat stranding. Vascular/Lymphatic: Atherosclerotic nonaneurysmal abdominal aorta. Patent portal, splenic, hepatic and renal veins. No pathologically enlarged lymph nodes in the abdomen or pelvis. Reproductive: Top-normal size prostate. Other: No pneumoperitoneum, ascites or focal fluid collection. Musculoskeletal: No aggressive appearing focal osseous lesions. Moderate lumbar spondylosis. IMPRESSION: 1. Stable right lower lobe pulmonary nodularity. 2. No new or progressive metastatic disease in the chest, abdomen or pelvis. 3. Chronic findings include stable ectatic 4.3 cm ascending thoracic aorta, Aortic Atherosclerosis (ICD10-I70.0) and cholelithiasis. Electronically Signed   By: Ilona Sorrel M.D.   On: 01/10/2017 12:42    ASSESSMENT AND PLAN:  This is a very pleasant 71 years old white male with recurrent non-small cell lung cancer, adenocarcinoma and positive EGFR mutation in exon 21. He is currently on Tarceva 100 mg by mouth daily status post 31 months of treatment. The patient continues to tolerate this treatment well with no significant adverse effects. He had repeat CT scan of the chest, abdomen and pelvis performed recently. I personally and inde reviewed the scan images and discuss the results with the patient and his wife. His scan showed no evidence for disease progression. I recommended for the patient to continue his current treatment with Tarceva 100 mg by mouth daily. I will see him back for follow-up visit in around 6 weeks for reevaluation and repeat blood work. For the anemia, I recommended for the patient to start taking multivitamin supplements. He does not eat a lot of red meat. For the hypothyroidism, he will continue his current treatment with levothyroxine. The patient was advised to call immediately if he has any concerning symptoms in the interval. The patient voices understanding of current  disease status and treatment options and is in agreement with the current care plan. All questions were answered. The patient knows to call the clinic with any problems, questions or concerns. We can certainly see the patient much sooner if necessary.  Disclaimer: This note was dictated with voice recognition software. Similar sounding words can inadvertently be transcribed and may not be corrected upon review.

## 2017-01-12 NOTE — Telephone Encounter (Signed)
Scheduled appt per 10/11 los - Gave patient AVS and calender per los.

## 2017-01-15 DIAGNOSIS — E039 Hypothyroidism, unspecified: Secondary | ICD-10-CM | POA: Diagnosis not present

## 2017-01-15 DIAGNOSIS — E782 Mixed hyperlipidemia: Secondary | ICD-10-CM | POA: Diagnosis not present

## 2017-01-15 DIAGNOSIS — I10 Essential (primary) hypertension: Secondary | ICD-10-CM | POA: Diagnosis not present

## 2017-01-15 DIAGNOSIS — I25110 Atherosclerotic heart disease of native coronary artery with unstable angina pectoris: Secondary | ICD-10-CM | POA: Diagnosis not present

## 2017-01-15 DIAGNOSIS — N400 Enlarged prostate without lower urinary tract symptoms: Secondary | ICD-10-CM | POA: Diagnosis not present

## 2017-01-15 DIAGNOSIS — D649 Anemia, unspecified: Secondary | ICD-10-CM | POA: Diagnosis not present

## 2017-01-15 DIAGNOSIS — C3490 Malignant neoplasm of unspecified part of unspecified bronchus or lung: Secondary | ICD-10-CM | POA: Diagnosis not present

## 2017-01-23 DIAGNOSIS — D485 Neoplasm of uncertain behavior of skin: Secondary | ICD-10-CM | POA: Diagnosis not present

## 2017-01-23 DIAGNOSIS — L814 Other melanin hyperpigmentation: Secondary | ICD-10-CM | POA: Diagnosis not present

## 2017-01-23 DIAGNOSIS — L988 Other specified disorders of the skin and subcutaneous tissue: Secondary | ICD-10-CM | POA: Diagnosis not present

## 2017-01-30 ENCOUNTER — Other Ambulatory Visit: Payer: Self-pay | Admitting: Medical Oncology

## 2017-01-30 DIAGNOSIS — C3431 Malignant neoplasm of lower lobe, right bronchus or lung: Secondary | ICD-10-CM

## 2017-01-30 MED ORDER — ERLOTINIB HCL 100 MG PO TABS: 100.0000 mg | ORAL_TABLET | Freq: Every day | ORAL | 3 refills | Status: DC

## 2017-02-14 DIAGNOSIS — G43019 Migraine without aura, intractable, without status migrainosus: Secondary | ICD-10-CM | POA: Diagnosis not present

## 2017-02-14 DIAGNOSIS — G43111 Migraine with aura, intractable, with status migrainosus: Secondary | ICD-10-CM | POA: Diagnosis not present

## 2017-02-21 ENCOUNTER — Telehealth: Payer: Self-pay | Admitting: Internal Medicine

## 2017-02-21 ENCOUNTER — Ambulatory Visit (HOSPITAL_BASED_OUTPATIENT_CLINIC_OR_DEPARTMENT_OTHER): Payer: Medicare Other | Admitting: Internal Medicine

## 2017-02-21 ENCOUNTER — Encounter: Payer: Self-pay | Admitting: Internal Medicine

## 2017-02-21 ENCOUNTER — Other Ambulatory Visit (HOSPITAL_BASED_OUTPATIENT_CLINIC_OR_DEPARTMENT_OTHER): Payer: Medicare Other

## 2017-02-21 VITALS — BP 123/78 | HR 69 | Temp 97.7°F | Resp 19 | Ht 73.0 in | Wt 237.2 lb

## 2017-02-21 DIAGNOSIS — C3411 Malignant neoplasm of upper lobe, right bronchus or lung: Secondary | ICD-10-CM | POA: Diagnosis not present

## 2017-02-21 DIAGNOSIS — Z5111 Encounter for antineoplastic chemotherapy: Secondary | ICD-10-CM

## 2017-02-21 DIAGNOSIS — E039 Hypothyroidism, unspecified: Secondary | ICD-10-CM

## 2017-02-21 DIAGNOSIS — I10 Essential (primary) hypertension: Secondary | ICD-10-CM

## 2017-02-21 DIAGNOSIS — C3431 Malignant neoplasm of lower lobe, right bronchus or lung: Secondary | ICD-10-CM

## 2017-02-21 LAB — CBC WITH DIFFERENTIAL/PLATELET
BASO%: 0.3 % (ref 0.0–2.0)
Basophils Absolute: 0 10*3/uL (ref 0.0–0.1)
EOS ABS: 0.2 10*3/uL (ref 0.0–0.5)
EOS%: 2.9 % (ref 0.0–7.0)
HCT: 41 % (ref 38.4–49.9)
HGB: 11.8 g/dL — ABNORMAL LOW (ref 13.0–17.1)
LYMPH%: 34.3 % (ref 14.0–49.0)
MCH: 32 pg (ref 27.2–33.4)
MCHC: 28.8 g/dL — AB (ref 32.0–36.0)
MCV: 111.1 fL — AB (ref 79.3–98.0)
MONO#: 0.7 10*3/uL (ref 0.1–0.9)
MONO%: 10.5 % (ref 0.0–14.0)
NEUT%: 52 % (ref 39.0–75.0)
NEUTROS ABS: 3.2 10*3/uL (ref 1.5–6.5)
Platelets: 185 10*3/uL (ref 140–400)
RBC: 3.69 10*6/uL — AB (ref 4.20–5.82)
RDW: 15.1 % — ABNORMAL HIGH (ref 11.0–14.6)
WBC: 6.2 10*3/uL (ref 4.0–10.3)
lymph#: 2.1 10*3/uL (ref 0.9–3.3)

## 2017-02-21 LAB — COMPREHENSIVE METABOLIC PANEL
ALBUMIN: 4 g/dL (ref 3.5–5.0)
ALK PHOS: 56 U/L (ref 40–150)
ALT: 25 U/L (ref 0–55)
AST: 28 U/L (ref 5–34)
Anion Gap: 8 mEq/L (ref 3–11)
BUN: 13 mg/dL (ref 7.0–26.0)
CALCIUM: 9.5 mg/dL (ref 8.4–10.4)
CHLORIDE: 106 meq/L (ref 98–109)
CO2: 27 mEq/L (ref 22–29)
CREATININE: 1 mg/dL (ref 0.7–1.3)
EGFR: >60 mL/min/{1.73_m2} (ref >60)
Glucose: 99 mg/dl (ref 70–140)
Potassium: 3.7 mEq/L (ref 3.5–5.1)
Sodium: 141 mEq/L (ref 136–145)
Total Bilirubin: 0.39 mg/dL (ref 0.20–1.20)
Total Protein: 6.9 g/dL (ref 6.4–8.3)

## 2017-02-21 NOTE — Telephone Encounter (Signed)
Gave avs and calendar for January 2019 °

## 2017-02-21 NOTE — Progress Notes (Signed)
[place] Telephone:([phone]   Fax:(336) [phone]  OFFICE PROGRESS NOTE  [name], MD 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  PRIOR THERAPY: 1) Status post right upper lobectomy under the care of Dr. Roxan Hockey on 01/23/2006.  2) Tarceva 150 mg by mouth daily as part of the BMS checkmate 370 clinical trial. Status post 6 weeks of treatment.  CURRENT THERAPY: Tarceva 100 mg by mouth daily as part of the BMS Checkmate 370 clinical trial. First dose started 07/17/2014 status post 32 months of treatment.  INTERVAL HISTORY: [name] 71 y.o. male returns to the clinic today for follow-up visit accompanied by his wife.  The patient is feeling fine today with no specific complaints.  He continues to tolerate his treatment with Tarceva fairly well.  He denied having any significant skin rash or diarrhea.  He has no chest pain, shortness breath, cough or hemoptysis.  He denied having any weight loss or night sweats.  He has no nausea, vomiting or constipation.  He is here today for evaluation and repeat blood work.   MEDICAL HISTORY: Past Medical History:  Diagnosis Date  . Cholelithiases 07/16/2015  . Complication of anesthesia 2007   quit breathing with bronchoscopy, had to spend night  . History of agent Orange exposure 44-45 yrs ago  . History of pneumonia  last 3-4 yrs ago   3 -4 different times  . Hypercholesterolemia   . Hypertension   . Hypothyroidism   . lung ca dx'd 2007   lung right  . Sinus disease    hx of  . Sleep apnea    uses cpap setting opf 12    ALLERGIES:  is allergic to lisinopril and prednisone.  MEDICATIONS:  Current Outpatient Medications  Medication Sig Dispense Refill  . amitriptyline (ELAVIL) 50 MG tablet Take 50 mg by mouth at  bedtime. Reported on 06/18/2015    . aspirin 81 MG tablet Take 81 mg by mouth every morning.     . Cholecalciferol 2000 units CAPS Take 2,000 Units by mouth daily.     . clindamycin (CLEOCIN-T) 1 % external solution Apply 1 application topically 2 (two) times daily as needed (for skin irritation). 240 mL 0  . clindamycin (CLINDAGEL) 1 % gel Apply topically 2 (two) times daily. 30 g 0  . diltiazem (CARDIZEM CD) 360 MG 24 hr capsule Take 360 mg by mouth every morning.     . erlotinib (TARCEVA) 100 MG tablet Take 1 tablet (100 mg total) by mouth daily. Take on an empty stomach 1 hour before meals or 2 hours after.faxed to [employer] 30 tablet 3  . levETIRAcetam (KEPPRA) 750 MG tablet Take 750 mg by mouth at bedtime.     . metoCLOPramide (REGLAN) 10 MG tablet Take 10 mg by mouth every 6 (six) hours as needed for nausea.     . metoprolol succinate (TOPROL-XL) 25 MG 24 hr tablet Take 25 mg by mouth every morning.     . Multiple Vitamins-Minerals (CENTRUM SILVER PO) Take 1 tablet by mouth daily.     . naproxen sodium (ANAPROX) 550 MG tablet Take 550 mg by mouth 3 (three) times daily as needed for headache (or pain). Reported on 08/27/2015    . nitroGLYCERIN (NITROSTAT) 0.4 MG SL tablet Place 0.4  mg under the tongue every 5 (five) minutes as needed for chest pain. Reported on 10/22/2015    . polyethylene glycol (MIRALAX / GLYCOLAX) packet Take 17 g by mouth daily. Reported on 08/27/2015    . psyllium (METAMUCIL) 58.6 % powder Take 1 packet by mouth daily. Reported on 08/27/2015    . ranitidine (ZANTAC) 75 MG tablet Take 75 mg by mouth 2 (two) times daily.    . rosuvastatin (CRESTOR) 5 MG tablet Take 5 mg by mouth every morning.     [name] SYNTHROID 112 MCG tablet Take 112 mcg by mouth daily.    . valsartan-hydrochlorothiazide (DIOVAN-HCT) 320-25 MG per tablet Take 1 tablet by mouth every morning.      No current facility-administered medications for this visit.     SURGICAL HISTORY:  Past Surgical History:    Procedure Laterality Date  . COLONOSCOPY WITH PROPOFOL N/A 03/19/2013   Performed by Garlan Fair, MD at [facility]  . Cystourethroscopy, [facility] TURP.  04/16/2010  . NASAL SINUS SURGERY  1987  . Right video-assisted thoracoscopy, right upper lobectomy and  01/23/2006  . The Olympus video bronchoscope was introduced via the right  12/21/2005  . TONSILLECTOMY  1957  . Torn medial and lateral menisci, left knee.  11/06/2006    REVIEW OF SYSTEMS:  A comprehensive review of systems was negative.   PHYSICAL EXAMINATION: General appearance: alert, cooperative and no distress Head: Normocephalic, without obvious abnormality, atraumatic Neck: no adenopathy, no JVD, supple, symmetrical, trachea midline and thyroid not enlarged, symmetric, no tenderness/mass/nodules Lymph nodes: Cervical, supraclavicular, and axillary nodes normal. Resp: clear to auscultation bilaterally Back: symmetric, no curvature. ROM normal. No CVA tenderness. Cardio: regular rate and rhythm, S1, S2 normal, no murmur, click, rub or gallop GI: soft, non-tender; bowel sounds normal; no masses,  no organomegaly Extremities: extremities normal, atraumatic, no cyanosis or edema  ECOG PERFORMANCE STATUS: 0 - Asymptomatic  Blood pressure 123/78, pulse 69, temperature 97.7 F (36.5 C), temperature source Oral, resp. rate 19, height '6\' 1"'  (1.854 m), weight 237 lb 3.2 oz (107.6 kg), SpO2 100 %.  LABORATORY DATA: Lab Results  Component Value Date   WBC 6.2 02/21/2017   HGB 11.8 (L) 02/21/2017   HCT 41.0 02/21/2017   MCV 111.1 (H) 02/21/2017   PLT 185 02/21/2017      Chemistry      Component Value Date/Time   NA 139 01/12/2017 0856   K 3.6 01/12/2017 0856   CL 98 (L) 05/12/2016 1602   CL 102 04/24/2012 0853   CO2 27 01/12/2017 0856   BUN 14.1 01/12/2017 0856   CREATININE 1.0 01/12/2017 0856      Component Value Date/Time   CALCIUM 9.1 01/12/2017 0856   ALKPHOS 59 01/12/2017 0856   AST 32 01/12/2017 0856    ALT 32 01/12/2017 0856   BILITOT 0.26 01/12/2017 0856       RADIOGRAPHIC STUDIES: No results found.  ASSESSMENT AND PLAN:  This is a very pleasant 71 years old white male with recurrent non-small cell lung cancer, adenocarcinoma and positive EGFR mutation in exon 21. He is currently on Tarceva 100 mg by mouth daily status post 32 months of treatment. He has no concerning adverse effect from his treatment with Tarceva. I recommended for the patient to continue his current treatment with Tarceva 100 mg p.o. Daily. I will see him back for follow-up visit in 6 weeks for evaluation with repeat blood work. He was advised to call immediately if he  has any concerning symptoms in the interval. The patient voices understanding of current disease status and treatment options and is in agreement with the current care plan. All questions were answered. The patient knows to call the clinic with any problems, questions or concerns. We can certainly see the patient much sooner if necessary.  Disclaimer: This note was dictated with voice recognition software. Similar sounding words can inadvertently be transcribed and may not be corrected upon review.

## 2017-03-14 ENCOUNTER — Telehealth: Payer: Self-pay | Admitting: *Deleted

## 2017-03-14 ENCOUNTER — Encounter: Payer: Self-pay | Admitting: *Deleted

## 2017-03-14 DIAGNOSIS — C3431 Malignant neoplasm of lower lobe, right bronchus or lung: Secondary | ICD-10-CM

## 2017-03-14 NOTE — Telephone Encounter (Signed)
03/14/2017 Received return phone call from patient this afternoon. See Research Encounter for details. Cindy S. Brigitte Pulse BSN, RN, CCRP 03/14/2017 4:45 PM

## 2017-03-14 NOTE — Telephone Encounter (Signed)
03/14/2017 Attempted to call patient in regards to study telephone call follow-up visit expected this week. No answer and no answering machine available at home phone number. Will attempt to call patient later today or later this week. Cindy S. Brigitte Pulse BSN, RN, [place] 03/14/2017 1:31 PM

## 2017-03-14 NOTE — Telephone Encounter (Signed)
03/14/2017 Left voice mail message for patient on home phone number, requesting that he call me back at my direct number [phone] regarding some questions for the quarterly research study follow-up. Cindy S. Brigitte Pulse BSN, RN, CCRP 03/14/2017 4:06 PM

## 2017-03-14 NOTE — Progress Notes (Signed)
03/14/2017 Spoke with patient by phone today for completion of research study Survival Follow-up visit [name]. Explained to patient that he is being contacted by phone for this visit, since he is not due to be seen in the clinic until after the study-mandated visit window. The EQ-5D patient questionnaire was administered to the patient by phone, by reading all instructions, questions and possible responses and recording the patient's responses on the Health Questionnaire. Thanked patient for his participation in the study, and noted that future visits would occur either in person or over the phone, depending on the patient's scheduled visits in clinic. Cindy S. Brigitte Pulse BSN, RN, CCRP 03/14/2017 4:50 PM

## 2017-04-06 ENCOUNTER — Other Ambulatory Visit (HOSPITAL_BASED_OUTPATIENT_CLINIC_OR_DEPARTMENT_OTHER): Payer: Medicare Other

## 2017-04-06 ENCOUNTER — Ambulatory Visit (HOSPITAL_BASED_OUTPATIENT_CLINIC_OR_DEPARTMENT_OTHER): Payer: Medicare Other | Admitting: Internal Medicine

## 2017-04-06 ENCOUNTER — Telehealth: Payer: Self-pay | Admitting: Internal Medicine

## 2017-04-06 DIAGNOSIS — C3411 Malignant neoplasm of upper lobe, right bronchus or lung: Secondary | ICD-10-CM

## 2017-04-06 DIAGNOSIS — C3490 Malignant neoplasm of unspecified part of unspecified bronchus or lung: Secondary | ICD-10-CM

## 2017-04-06 DIAGNOSIS — C3431 Malignant neoplasm of lower lobe, right bronchus or lung: Secondary | ICD-10-CM

## 2017-04-06 LAB — CBC WITH DIFFERENTIAL/PLATELET
BASO%: 0.2 % (ref 0.0–2.0)
BASOS ABS: 0 10*3/uL (ref 0.0–0.1)
EOS ABS: 0.2 10*3/uL (ref 0.0–0.5)
EOS%: 3.3 % (ref 0.0–7.0)
HEMATOCRIT: 35.9 % — AB (ref 38.4–49.9)
HGB: 11.6 g/dL — ABNORMAL LOW (ref 13.0–17.1)
LYMPH%: 34.5 % (ref 14.0–49.0)
MCH: 30.8 pg (ref 27.2–33.4)
MCHC: 32.3 g/dL (ref 32.0–36.0)
MCV: 95.2 fL (ref 79.3–98.0)
MONO#: 0.8 10*3/uL (ref 0.1–0.9)
MONO%: 14.2 % — ABNORMAL HIGH (ref 0.0–14.0)
NEUT#: 2.6 10*3/uL (ref 1.5–6.5)
NEUT%: 47.8 % (ref 39.0–75.0)
PLATELETS: 220 10*3/uL (ref 140–400)
RBC: 3.77 10*6/uL — ABNORMAL LOW (ref 4.20–5.82)
RDW: 13.9 % (ref 11.0–14.6)
WBC: 5.5 10*3/uL (ref 4.0–10.3)
lymph#: 1.9 10*3/uL (ref 0.9–3.3)

## 2017-04-06 LAB — COMPREHENSIVE METABOLIC PANEL
ALT: 27 U/L (ref 0–55)
AST: 27 U/L (ref 5–34)
Albumin: 4 g/dL (ref 3.5–5.0)
Alkaline Phosphatase: 58 U/L (ref 40–150)
Anion Gap: 8 mEq/L (ref 3–11)
BUN: 14.4 mg/dL (ref 7.0–26.0)
CHLORIDE: 102 meq/L (ref 98–109)
CO2: 29 meq/L (ref 22–29)
Calcium: 9.5 mg/dL (ref 8.4–10.4)
Creatinine: 1 mg/dL (ref 0.7–1.3)
EGFR: >60 mL/min/{1.73_m2} (ref >60)
GLUCOSE: 99 mg/dL (ref 70–140)
POTASSIUM: 3.8 meq/L (ref 3.5–5.1)
SODIUM: 139 meq/L (ref 136–145)
Total Bilirubin: 0.45 mg/dL (ref 0.20–1.20)
Total Protein: 6.9 g/dL (ref 6.4–8.3)

## 2017-04-06 NOTE — Progress Notes (Signed)
[place] Telephone:([phone]) [phone]   [phone]  OFFICE PROGRESS NOTE  [name], MD 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  PRIOR THERAPY: 1) Status post right upper lobectomy under the care of Dr. Roxan Hockey on 01/23/2006.  2) Tarceva 150 mg by mouth daily as part of the BMS checkmate 370 clinical trial. Status post 6 weeks of treatment.  CURRENT THERAPY: Tarceva 100 mg by mouth daily as part of the BMS Checkmate 370 clinical trial. First dose started 07/17/2014 status post 33 months of treatment.  INTERVAL HISTORY: [name] 72 y.o. male returns to the clinic today for follow-up visit.  The patient is feeling fine today with no specific complaints.  He denied having any skin rash or diarrhea.  He denied having any fever or chills.  He has no chest pain, shortness breath, cough or hemoptysis.  He has no recent weight loss or night sweats.  He continues to tolerate his treatment with Tarceva fairly well.  The patient is here today for evaluation and repeat blood work.  MEDICAL HISTORY: Past Medical History:  Diagnosis Date  . Cholelithiases 07/16/2015  . Complication of anesthesia 2007   quit breathing with bronchoscopy, had to spend night  . History of agent Orange exposure 44-45 yrs ago  . History of pneumonia  last 3-4 yrs ago   3 -4 different times  . Hypercholesterolemia   . Hypertension   . Hypothyroidism   . lung ca dx'd 2007   lung right  . Sinus disease    hx of  . Sleep apnea    uses cpap setting opf 12    ALLERGIES:  is allergic to lisinopril and prednisone.  MEDICATIONS:  Current Outpatient Medications  Medication Sig Dispense Refill  . amitriptyline (ELAVIL) 50 MG tablet Take 50 mg by mouth at bedtime. Reported on 06/18/2015    .  aspirin 81 MG tablet Take 81 mg by mouth every morning.     . Cholecalciferol 2000 units CAPS Take 2,000 Units by mouth daily.     . clindamycin (CLEOCIN-T) 1 % external solution Apply 1 application topically 2 (two) times daily as needed (for skin irritation). 240 mL 0  . clindamycin (CLINDAGEL) 1 % gel Apply topically 2 (two) times daily. 30 g 0  . diltiazem (CARDIZEM CD) 360 MG 24 hr capsule Take 360 mg by mouth every morning.     . erlotinib (TARCEVA) 100 MG tablet Take 1 tablet (100 mg total) by mouth daily. Take on an empty stomach 1 hour before meals or 2 hours after.faxed to [employer] 30 tablet 3  . levETIRAcetam (KEPPRA) 750 MG tablet Take 750 mg by mouth at bedtime.     . metoCLOPramide (REGLAN) 10 MG tablet Take 10 mg by mouth every 6 (six) hours as needed for nausea.     . metoprolol succinate (TOPROL-XL) 25 MG 24 hr tablet Take 25 mg by mouth every morning.     . Multiple Vitamins-Minerals (CENTRUM SILVER PO) Take 1 tablet by mouth daily.     . naproxen sodium (ANAPROX) 550 MG tablet Take 550 mg by mouth 3 (three) times daily as needed for headache (or pain). Reported on 08/27/2015    . nitroGLYCERIN (NITROSTAT) 0.4 MG SL tablet Place 0.4 mg under the tongue every  5 (five) minutes as needed for chest pain. Reported on 10/22/2015    . polyethylene glycol (MIRALAX / GLYCOLAX) packet Take 17 g by mouth daily. Reported on 08/27/2015    . psyllium (METAMUCIL) 58.6 % powder Take 1 packet by mouth daily. Reported on 08/27/2015    . ranitidine (ZANTAC) 75 MG tablet Take 75 mg by mouth 2 (two) times daily.    . rosuvastatin (CRESTOR) 5 MG tablet Take 5 mg by mouth every morning.     [name] SYNTHROID 112 MCG tablet Take 112 mcg by mouth daily.    . valsartan-hydrochlorothiazide (DIOVAN-HCT) 320-25 MG per tablet Take 1 tablet by mouth every morning.      No current facility-administered medications for this visit.     SURGICAL HISTORY:  Past Surgical History:  Procedure Laterality Date  .  COLONOSCOPY WITH PROPOFOL N/A 03/19/2013   Procedure: COLONOSCOPY WITH PROPOFOL;  Surgeon: Garlan Fair, MD;  Location: [facility];  Service: Endoscopy;  Laterality: N/A;  . Cystourethroscopy, [facility].  04/16/2010  . NASAL SINUS SURGERY  1987  . Right video-assisted thoracoscopy, right upper lobectomy and  01/23/2006  . The Olympus video bronchoscope was introduced via the right  12/21/2005  . TONSILLECTOMY  1957  . Torn medial and lateral menisci, left knee.  11/06/2006    REVIEW OF SYSTEMS:  A comprehensive review of systems was negative.   PHYSICAL EXAMINATION: General appearance: alert, cooperative and no distress Head: Normocephalic, without obvious abnormality, atraumatic Neck: no adenopathy, no JVD, supple, symmetrical, trachea midline and thyroid not enlarged, symmetric, no tenderness/mass/nodules Lymph nodes: Cervical, supraclavicular, and axillary nodes normal. Resp: clear to auscultation bilaterally Back: symmetric, no curvature. ROM normal. No CVA tenderness. Cardio: regular rate and rhythm, S1, S2 normal, no murmur, click, rub or gallop GI: soft, non-tender; bowel sounds normal; no masses,  no organomegaly Extremities: extremities normal, atraumatic, no cyanosis or edema  ECOG PERFORMANCE STATUS: 0 - Asymptomatic  Blood pressure 118/75, Coleman 68, temperature 98.1 F (36.7 C), temperature source Oral, resp. rate 18, height _0  (1.854 m), weight 238 lb 3.2 oz (108 kg), SpO2 98 %.  LABORATORY DATA: Lab Results  Component Value Date   WBC 5.5 04/06/2017   HGB 11.6 (L) 04/06/2017   HCT 35.9 (L) 04/06/2017   MCV 95.2 04/06/2017   PLT 220 04/06/2017      Chemistry      Component Value Date/Time   NA 139 04/06/2017 0932   K 3.8 04/06/2017 0932   CL 98 (L) 05/12/2016 1602   CL 102 04/24/2012 0853   CO2 29 04/06/2017 0932   BUN 14.4 04/06/2017 0932   CREATININE 1.0 04/06/2017 0932      Component Value Date/Time   CALCIUM 9.5 04/06/2017 0932   ALKPHOS 58  04/06/2017 0932   AST 27 04/06/2017 0932   ALT 27 04/06/2017 0932   BILITOT 0.45 04/06/2017 0932       RADIOGRAPHIC STUDIES: No results found.  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 He is currently on Tarceva 100 mg by mouth daily status post 33 months of treatment. The patient continues to tolerate this treatment fairly well with no concerning complaints. I recommended for him to continue his current treatment with Tarceva with the same dose.  I will see him back for follow-up visit in 6 weeks for evaluation after repeating CT scan of  the chest, abdomen and pelvis for restaging of his disease. The patient was advised to call immediately if he has any concerning symptoms in the interval. The patient voices understanding of current disease status and treatment options and is in agreement with the current care plan. All questions were answered. The patient knows to call the clinic with any problems, questions or concerns. We can certainly see the patient much sooner if necessary.  Disclaimer: This note was dictated with voice recognition software. Similar sounding words can inadvertently be transcribed and may not be corrected upon review.

## 2017-04-06 NOTE — Telephone Encounter (Signed)
Scheduled appt per 1/3 los - Gave patient aVS and calender per los,. Central radiology to contact patient with ct schedule.

## 2017-04-07 ENCOUNTER — Encounter: Payer: Self-pay | Admitting: Internal Medicine

## 2017-04-25 DIAGNOSIS — H2513 Age-related nuclear cataract, bilateral: Secondary | ICD-10-CM | POA: Diagnosis not present

## 2017-04-25 DIAGNOSIS — H5203 Hypermetropia, bilateral: Secondary | ICD-10-CM | POA: Diagnosis not present

## 2017-05-12 DIAGNOSIS — Z1283 Encounter for screening for malignant neoplasm of skin: Secondary | ICD-10-CM | POA: Diagnosis not present

## 2017-05-12 DIAGNOSIS — D2272 Melanocytic nevi of left lower limb, including hip: Secondary | ICD-10-CM | POA: Diagnosis not present

## 2017-05-12 DIAGNOSIS — D225 Melanocytic nevi of trunk: Secondary | ICD-10-CM | POA: Diagnosis not present

## 2017-05-12 DIAGNOSIS — D485 Neoplasm of uncertain behavior of skin: Secondary | ICD-10-CM | POA: Diagnosis not present

## 2017-05-12 DIAGNOSIS — D224 Melanocytic nevi of scalp and neck: Secondary | ICD-10-CM | POA: Diagnosis not present

## 2017-05-15 ENCOUNTER — Ambulatory Visit (HOSPITAL_COMMUNITY)
Admission: RE | Admit: 2017-05-15 | Discharge: 2017-05-15 | Disposition: A | Discharge status: Home / Self Care | Payer: Medicare Other | Source: Ambulatory Visit | Attending: Internal Medicine | Admitting: Internal Medicine

## 2017-05-15 ENCOUNTER — Inpatient Hospital Stay: Payer: Medicare Other | Attending: Internal Medicine

## 2017-05-15 DIAGNOSIS — K4020 Bilateral inguinal hernia, without obstruction or gangrene, not specified as recurrent: Secondary | ICD-10-CM | POA: Insufficient documentation

## 2017-05-15 DIAGNOSIS — E7800 Pure hypercholesterolemia, unspecified: Secondary | ICD-10-CM | POA: Diagnosis not present

## 2017-05-15 DIAGNOSIS — C3431 Malignant neoplasm of lower lobe, right bronchus or lung: Secondary | ICD-10-CM | POA: Diagnosis not present

## 2017-05-15 DIAGNOSIS — I10 Essential (primary) hypertension: Secondary | ICD-10-CM | POA: Insufficient documentation

## 2017-05-15 DIAGNOSIS — I700 Atherosclerosis of aorta: Secondary | ICD-10-CM | POA: Insufficient documentation

## 2017-05-15 DIAGNOSIS — Z7982 Long term (current) use of aspirin: Secondary | ICD-10-CM | POA: Diagnosis not present

## 2017-05-15 DIAGNOSIS — R918 Other nonspecific abnormal finding of lung field: Secondary | ICD-10-CM | POA: Diagnosis not present

## 2017-05-15 DIAGNOSIS — K8020 Calculus of gallbladder without cholecystitis without obstruction: Secondary | ICD-10-CM | POA: Insufficient documentation

## 2017-05-15 DIAGNOSIS — G4730 Sleep apnea, unspecified: Secondary | ICD-10-CM | POA: Insufficient documentation

## 2017-05-15 DIAGNOSIS — K5730 Diverticulosis of large intestine without perforation or abscess without bleeding: Secondary | ICD-10-CM | POA: Diagnosis not present

## 2017-05-15 DIAGNOSIS — E039 Hypothyroidism, unspecified: Secondary | ICD-10-CM | POA: Insufficient documentation

## 2017-05-15 DIAGNOSIS — C3490 Malignant neoplasm of unspecified part of unspecified bronchus or lung: Secondary | ICD-10-CM

## 2017-05-15 DIAGNOSIS — Z79899 Other long term (current) drug therapy: Secondary | ICD-10-CM | POA: Diagnosis not present

## 2017-05-15 DIAGNOSIS — Z85118 Personal history of other malignant neoplasm of bronchus and lung: Secondary | ICD-10-CM | POA: Diagnosis not present

## 2017-05-15 LAB — CBC WITH DIFFERENTIAL/PLATELET
BASOS ABS: 0 10*3/uL (ref 0.0–0.1)
Basophils Relative: 1 %
EOS ABS: 0.3 10*3/uL (ref 0.0–0.5)
EOS PCT: 4 %
HCT: 37.4 % — ABNORMAL LOW (ref 38.4–49.9)
Hemoglobin: 12 g/dL — ABNORMAL LOW (ref 13.0–17.1)
Lymphocytes Relative: 38 %
Lymphs Abs: 2.5 10*3/uL (ref 0.9–3.3)
MCH: 30.5 pg (ref 27.2–33.4)
MCHC: 32.1 g/dL (ref 32.0–36.0)
MCV: 95.2 fL (ref 79.3–98.0)
Monocytes Absolute: 0.9 10*3/uL (ref 0.1–0.9)
Monocytes Relative: 14 %
NEUTROS PCT: 43 %
Neutro Abs: 2.8 10*3/uL (ref 1.5–6.5)
Platelets: 275 10*3/uL (ref 140–400)
RBC: 3.93 MIL/uL — AB (ref 4.20–5.82)
RDW: 14.3 % (ref 11.0–14.6)
WBC: 6.5 10*3/uL (ref 4.0–10.3)

## 2017-05-15 LAB — COMPREHENSIVE METABOLIC PANEL
ALT: 25 U/L (ref 0–55)
AST: 26 U/L (ref 5–34)
Albumin: 4.1 g/dL (ref 3.5–5.0)
Alkaline Phosphatase: 65 U/L (ref 40–150)
Anion gap: 8 (ref 3–11)
BILIRUBIN TOTAL: 0.4 mg/dL (ref 0.2–1.2)
BUN: 16 mg/dL (ref 7–26)
CO2: 27 mmol/L (ref 22–29)
CREATININE: 1.21 mg/dL (ref 0.70–1.30)
Calcium: 9.7 mg/dL (ref 8.4–10.4)
Chloride: 102 mmol/L (ref 98–109)
GFR, EST NON AFRICAN AMERICAN: 58 mL/min — AB (ref >60)
Glucose, Bld: 98 mg/dL (ref 70–140)
POTASSIUM: 3.8 mmol/L (ref 3.5–5.1)
Sodium: 137 mmol/L (ref 136–145)
TOTAL PROTEIN: 7 g/dL (ref 6.4–8.3)

## 2017-05-15 MED ORDER — SODIUM CHLORIDE 0.9 % IJ SOLN
INTRAMUSCULAR | Status: AC
  Filled 2017-05-15: qty 50

## 2017-05-15 MED ORDER — IOPAMIDOL (ISOVUE-300) INJECTION 61%
100.0000 mL | Freq: Once | INTRAVENOUS | Status: AC | PRN
  Administered 2017-05-15: 100 mL via INTRAVENOUS

## 2017-05-15 MED ORDER — IOPAMIDOL (ISOVUE-300) INJECTION 61%
INTRAVENOUS | Status: AC
  Filled 2017-05-15: qty 100

## 2017-05-18 ENCOUNTER — Encounter: Payer: Self-pay | Admitting: *Deleted

## 2017-05-18 ENCOUNTER — Encounter: Payer: Self-pay | Admitting: Oncology

## 2017-05-18 ENCOUNTER — Telehealth: Payer: Self-pay | Admitting: Oncology

## 2017-05-18 ENCOUNTER — Inpatient Hospital Stay (HOSPITAL_BASED_OUTPATIENT_CLINIC_OR_DEPARTMENT_OTHER): Payer: Medicare Other | Admitting: Oncology

## 2017-05-18 VITALS — BP 127/66 | HR 63 | Temp 97.8°F | Resp 20 | Ht 73.0 in | Wt 241.3 lb

## 2017-05-18 DIAGNOSIS — C3431 Malignant neoplasm of lower lobe, right bronchus or lung: Secondary | ICD-10-CM | POA: Diagnosis not present

## 2017-05-18 DIAGNOSIS — Z5111 Encounter for antineoplastic chemotherapy: Secondary | ICD-10-CM

## 2017-05-18 DIAGNOSIS — E7800 Pure hypercholesterolemia, unspecified: Secondary | ICD-10-CM | POA: Diagnosis not present

## 2017-05-18 DIAGNOSIS — K4020 Bilateral inguinal hernia, without obstruction or gangrene, not specified as recurrent: Secondary | ICD-10-CM | POA: Diagnosis not present

## 2017-05-18 DIAGNOSIS — Z79899 Other long term (current) drug therapy: Secondary | ICD-10-CM | POA: Diagnosis not present

## 2017-05-18 DIAGNOSIS — I10 Essential (primary) hypertension: Secondary | ICD-10-CM | POA: Diagnosis not present

## 2017-05-18 DIAGNOSIS — E039 Hypothyroidism, unspecified: Secondary | ICD-10-CM | POA: Diagnosis not present

## 2017-05-18 DIAGNOSIS — I700 Atherosclerosis of aorta: Secondary | ICD-10-CM | POA: Diagnosis not present

## 2017-05-18 NOTE — Telephone Encounter (Signed)
Scheduled appt per 2/14 los - Gave patient AVS and calender per los.

## 2017-05-18 NOTE — Progress Notes (Signed)
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  PRIOR THERAPY: 1) Status post right upper lobectomy under the care of Dr. Roxan Hockey on 01/23/2006.  2) Tarceva 150 mg by mouth daily as part of the BMS checkmate 370 clinical trial. Status post 6 weeks of treatment.  CURRENT THERAPY: Tarceva 100 mg by mouth daily as part of the BMS Checkmate 370 clinical trial. First dose started 07/17/2014 status post 34 months of treatment.  INTERVAL HISTORY: [name] 72 y.o. male returns for routine follow-up visit accompanied by his wife.  The patient is feeling fine today has no specific complaints except for having right flank pain which is been present for approximately 3 weeks.  He reports that his pain is not constant but does cause him some discomfort.  He thinks the pain may be improving some.  He is not taking any medication for this.  He denies fevers and chills.  Denies chest pain, shortness breath, cough, hemoptysis.  Denies nausea, vomiting, constipation, diarrhea.  Denies skin rashes.  Denies recent weight loss or night sweats.  The patient continues to tolerate his treatment with Tarceva fairly well.  He had a recent restaging CT scan is here to discuss the results.  MEDICAL HISTORY: Past Medical History:  Diagnosis Date  . Cholelithiases 07/16/2015  . Complication of anesthesia 2007   quit breathing with bronchoscopy, had to spend night  . History of agent Orange exposure 44-45 yrs ago  . History of pneumonia  last 3-4 yrs ago   3 -4 different times  . Hypercholesterolemia   . Hypertension   . Hypothyroidism   . lung ca dx'd 2007   lung right  . Sinus disease    hx of  . Sleep apnea    uses cpap setting opf 12    ALLERGIES:  is  allergic to lisinopril and prednisone.  MEDICATIONS:  Current Outpatient Medications  Medication Sig Dispense Refill  . amitriptyline (ELAVIL) 50 MG tablet Take 50 mg by mouth at bedtime. Reported on 06/18/2015    . aspirin 81 MG tablet Take 81 mg by mouth every morning.     . Cholecalciferol 2000 units CAPS Take 2,000 Units by mouth daily.     . clindamycin (CLEOCIN-T) 1 % external solution Apply 1 application topically 2 (two) times daily as needed (for skin irritation). 240 mL 0  . diltiazem (CARDIZEM CD) 360 MG 24 hr capsule Take 360 mg by mouth every morning.     . erlotinib (TARCEVA) 100 MG tablet Take 1 tablet (100 mg total) by mouth daily. Take on an empty stomach 1 hour before meals or 2 hours after.faxed to [employer] 30 tablet 3  . levETIRAcetam (KEPPRA) 750 MG tablet Take 750 mg by mouth at bedtime.     . metoprolol succinate (TOPROL-XL) 25 MG 24 hr tablet Take 25 mg by mouth every morning.     . Multiple Vitamins-Minerals (CENTRUM SILVER PO) Take 1 tablet by mouth daily.     . polyethylene glycol (MIRALAX / GLYCOLAX) packet Take 17 g by mouth daily. Reported on 08/27/2015    . psyllium (METAMUCIL) 58.6 % powder Take 1 packet by mouth daily. Reported on 08/27/2015    . ranitidine (ZANTAC) 75 MG tablet Take 75 mg by  mouth 2 (two) times daily.    . rosuvastatin (CRESTOR) 5 MG tablet Take 5 mg by mouth every morning.     [name] SYNTHROID 112 MCG tablet Take 112 mcg by mouth daily.    . valsartan-hydrochlorothiazide (DIOVAN-HCT) 320-25 MG per tablet Take 1 tablet by mouth every morning.     . metoCLOPramide (REGLAN) 10 MG tablet Take 10 mg by mouth every 6 (six) hours as needed for nausea.     . naproxen sodium (ANAPROX) 550 MG tablet Take 550 mg by mouth 3 (three) times daily as needed for headache (or pain). Reported on 08/27/2015    . nitroGLYCERIN (NITROSTAT) 0.4 MG SL tablet Place 0.4 mg under the tongue every 5 (five) minutes as needed for chest pain. Reported on 10/22/2015     No current  facility-administered medications for this visit.     SURGICAL HISTORY:  Past Surgical History:  Procedure Laterality Date  . COLONOSCOPY WITH PROPOFOL N/A 03/19/2013   Procedure: COLONOSCOPY WITH PROPOFOL;  Surgeon: Garlan Fair, MD;  Location: [facility];  Service: Endoscopy;  Laterality: N/A;  . Cystourethroscopy, [facility].  04/16/2010  . NASAL SINUS SURGERY  1987  . Right video-assisted thoracoscopy, right upper lobectomy and  01/23/2006  . The Olympus video bronchoscope was introduced via the right  12/21/2005  . TONSILLECTOMY  1957  . Torn medial and lateral menisci, left knee.  11/06/2006    REVIEW OF SYSTEMS:   Review of Systems  Constitutional: Negative for appetite change, chills, fatigue, fever and unexpected weight change.  HENT:   Negative for mouth sores, nosebleeds, sore throat and trouble swallowing.   Eyes: Negative for eye problems and icterus.  Respiratory: Negative for cough, hemoptysis, shortness of breath and wheezing.   Cardiovascular: Negative for chest pain and leg swelling.  Gastrointestinal: Negative for abdominal pain, constipation, diarrhea, nausea and vomiting. Positive for right flank pain. Genitourinary: Negative for bladder incontinence, difficulty urinating, dysuria, frequency and hematuria.   Musculoskeletal: Negative for back pain, gait problem, neck pain and neck stiffness.  Skin: Negative for itching and rash.  Neurological: Negative for dizziness, extremity weakness, gait problem, headaches, light-headedness and seizures.  Hematological: Negative for adenopathy. Does not bruise/bleed easily.  Psychiatric/Behavioral: Negative for confusion, depression and sleep disturbance. The patient is not nervous/anxious.     PHYSICAL EXAMINATION:  Blood pressure 127/66, pulse 63, temperature 97.8 F (36.6 C), temperature source Oral, resp. rate 20, height _0  (1.854 m), weight 241 lb 4.8 oz (109.5 kg), SpO2 98 %.  ECOG PERFORMANCE STATUS: 1 -  Symptomatic but completely ambulatory  Physical Exam  Constitutional: Oriented to person, place, and time and well-developed, well-nourished, and in no distress. No distress.  HENT:  Head: Normocephalic and atraumatic.  Mouth/Throat: Oropharynx is clear and moist. No oropharyngeal exudate.  Eyes: Conjunctivae are normal. Right eye exhibits no discharge. Left eye exhibits no discharge. No scleral icterus.  Neck: Normal range of motion. Neck supple.  Cardiovascular: Normal rate, regular rhythm, normal heart sounds and intact distal pulses.   Pulmonary/Chest: Effort normal and breath sounds normal. No respiratory distress. No wheezes. No rales.  Abdominal: Soft. Bowel sounds are normal. Exhibits no distension and no mass.  Tenderness to the right flank with palpation.  Musculoskeletal: Normal range of motion. Exhibits no edema.  Lymphadenopathy:    No cervical adenopathy.  Neurological: Alert and oriented to person, place, and time. Exhibits normal muscle tone. Gait normal. Coordination normal.  Skin: Skin is warm and dry. No rash  noted. Not diaphoretic. No erythema. No pallor.  Psychiatric: Mood, memory and judgment normal.  Vitals reviewed.  LABORATORY DATA: Lab Results  Component Value Date   WBC 6.5 05/15/2017   HGB 12.0 (L) 05/15/2017   HCT 37.4 (L) 05/15/2017   MCV 95.2 05/15/2017   PLT 275 05/15/2017      Chemistry      Component Value Date/Time   NA 137 05/15/2017 0723   NA 139 04/06/2017 0932   K 3.8 05/15/2017 0723   K 3.8 04/06/2017 0932   CL 102 05/15/2017 0723   CL 102 04/24/2012 0853   CO2 27 05/15/2017 0723   CO2 29 04/06/2017 0932   BUN 16 05/15/2017 0723   BUN 14.4 04/06/2017 0932   CREATININE 1.21 05/15/2017 0723   CREATININE 1.0 04/06/2017 0932      Component Value Date/Time   CALCIUM 9.7 05/15/2017 0723   CALCIUM 9.5 04/06/2017 0932   ALKPHOS 65 05/15/2017 0723   ALKPHOS 58 04/06/2017 0932   AST 26 05/15/2017 0723   AST 27 04/06/2017 0932   ALT 25  05/15/2017 0723   ALT 27 04/06/2017 0932   BILITOT 0.4 05/15/2017 0723   BILITOT 0.45 04/06/2017 0932       RADIOGRAPHIC STUDIES:  Ct Chest W Contrast  Result Date: 05/15/2017 CLINICAL DATA:  Patient with history of lung cancer status post right upper lobectomy. On chemotherapy. EXAM: CT CHEST, ABDOMEN, AND PELVIS WITH CONTRAST TECHNIQUE: Multidetector CT imaging of the chest, abdomen and pelvis was performed following the standard protocol during bolus administration of intravenous contrast. CONTRAST:  154m ISOVUE-300 IOPAMIDOL (ISOVUE-300) INJECTION 61% COMPARISON:  CT CAP 01/10/2017. FINDINGS: CT CHEST FINDINGS Cardiovascular: Normal heart size. No pericardial effusion. Ascending thoracic aorta measures 4.2 cm, unchanged. Normal caliber pulmonary artery. Thoracic aortic vascular calcifications. Mediastinum/Nodes: No enlarged axillary, mediastinal or hilar lymphadenopathy. Lungs/Pleura: Central airways are patent. Patient status post right upper lobectomy. Grossly unchanged 16 x 11 mm irregular sub solid nodule right lower lobe (image 55; series 4). Similar-appearing 8 mm subpleural right lower lobe nodule (image 60; series 4), previously 9 mm. Adjacent 6 mm right lower lobe nodule (image 60; series 4), previously 6 mm. No new or enlarging pulmonary nodules. Dependent atelectasis within the right lower lobe. No pleural effusion or pneumothorax. Musculoskeletal: No aggressive or acute appearing osseous lesions. CT ABDOMEN PELVIS FINDINGS Hepatobiliary: Liver is normal in size and contour. No focal lesion identified. Cholelithiasis without secondary signs of acute cholecystitis. Pancreas: Unremarkable Spleen: Unremarkable Adrenals/Urinary Tract: Adrenal glands are normal. Kidneys enhance symmetrically with contrast. No hydronephrosis. Urinary bladder is unremarkable. 1.9 cm exophytic cyst interpolar region right kidney. Stomach/Bowel: Descending and sigmoid colonic diverticulosis. No CT evidence for  acute diverticulitis. Stool throughout the colon. Normal appendix. Normal morphology of the stomach. No free fluid or free intraperitoneal air. Vascular/Lymphatic: Normal caliber abdominal aorta. Peripheral calcified atherosclerotic plaque. No retroperitoneal lymphadenopathy. Reproductive: Prostate is unremarkable. Other: Small bilateral fat containing inguinal hernias, left-greater-than-right. Musculoskeletal: Lumbar spine degenerative changes. No aggressive or acute appearing osseous lesions. IMPRESSION: 1. Stable right lower lobe nodularity. 2. No new or progressive metastatic disease in the chest, abdomen pelvis. 3.  Aortic Atherosclerosis (ICD10-I70.0). Electronically Signed   By: DLovey NewcomerM.D.   On: 05/15/2017 10:43   Ct Abdomen Pelvis W Contrast  Result Date: 05/15/2017 CLINICAL DATA:  Patient with history of lung cancer status post right upper lobectomy. On chemotherapy. EXAM: CT CHEST, ABDOMEN, AND PELVIS WITH CONTRAST TECHNIQUE: Multidetector CT imaging of the chest,  abdomen and pelvis was performed following the standard protocol during bolus administration of intravenous contrast. CONTRAST:  157m ISOVUE-300 IOPAMIDOL (ISOVUE-300) INJECTION 61% COMPARISON:  CT CAP 01/10/2017. FINDINGS: CT CHEST FINDINGS Cardiovascular: Normal heart size. No pericardial effusion. Ascending thoracic aorta measures 4.2 cm, unchanged. Normal caliber pulmonary artery. Thoracic aortic vascular calcifications. Mediastinum/Nodes: No enlarged axillary, mediastinal or hilar lymphadenopathy. Lungs/Pleura: Central airways are patent. Patient status post right upper lobectomy. Grossly unchanged 16 x 11 mm irregular sub solid nodule right lower lobe (image 55; series 4). Similar-appearing 8 mm subpleural right lower lobe nodule (image 60; series 4), previously 9 mm. Adjacent 6 mm right lower lobe nodule (image 60; series 4), previously 6 mm. No new or enlarging pulmonary nodules. Dependent atelectasis within the right lower  lobe. No pleural effusion or pneumothorax. Musculoskeletal: No aggressive or acute appearing osseous lesions. CT ABDOMEN PELVIS FINDINGS Hepatobiliary: Liver is normal in size and contour. No focal lesion identified. Cholelithiasis without secondary signs of acute cholecystitis. Pancreas: Unremarkable Spleen: Unremarkable Adrenals/Urinary Tract: Adrenal glands are normal. Kidneys enhance symmetrically with contrast. No hydronephrosis. Urinary bladder is unremarkable. 1.9 cm exophytic cyst interpolar region right kidney. Stomach/Bowel: Descending and sigmoid colonic diverticulosis. No CT evidence for acute diverticulitis. Stool throughout the colon. Normal appendix. Normal morphology of the stomach. No free fluid or free intraperitoneal air. Vascular/Lymphatic: Normal caliber abdominal aorta. Peripheral calcified atherosclerotic plaque. No retroperitoneal lymphadenopathy. Reproductive: Prostate is unremarkable. Other: Small bilateral fat containing inguinal hernias, left-greater-than-right. Musculoskeletal: Lumbar spine degenerative changes. No aggressive or acute appearing osseous lesions. IMPRESSION: 1. Stable right lower lobe nodularity. 2. No new or progressive metastatic disease in the chest, abdomen pelvis. 3.  Aortic Atherosclerosis (ICD10-I70.0). Electronically Signed   By: DLovey NewcomerM.D.   On: 05/15/2017 10:43     ASSESSMENT/PLAN:  Malignant neoplasm of lower lobe of right lung This is a very pleasant 72year old white male with recurrent non-small cell lung cancer, adenocarcinoma and positive EGFR mutation in exon 21. He is currently on Tarceva 100 mg by mouth daily status post 33 months of treatment. The patient continues to tolerate this treatment fairly well with no concerning complaints. He had a recent restaging CT scan and is here to discuss the results. The patient was seen with Dr. MJulien Nordmann  CT scan results were discussed with the patient and his wife which showed no evidence of disease  progression.  Recommend that he continue Tarceva at the current dose. His right sided pain may be related to cholelithiasis.  There is no evidence of metastatic disease in the area which he is experiencing pain. The patient will follow-up in 6 weeks for evaluation repeat lab work.  The patient voices understanding of current disease status and treatment options and is in agreement with the current care plan. All questions were answered. The patient knows to call the clinic with any problems, questions or concerns. We can certainly see the patient much sooner if necessary.  Orders Placed This Encounter  Procedures  . CBC with Differential (Cancer Center Only)    Standing Status:   Future    Standing Expiration Date:   05/18/2018  . CMP ([facility])    Standing Status:   Future    Standing Expiration Date:   05/18/2018    KMikey Bussing DNP, AGPCNP-BC, AOCNP 05/18/17  ADDENDUM: Hematology/Oncology Attending: I had a face-to-face encounter with the patient today.  I recommended his care plan.  This is a very pleasant 72years old white male with  recurrent non-small cell lung cancer, adenocarcinoma with positive EGFR mutation in exon 21.  The patient has been on treatment with Tarceva 100 mg p.o. daily status post 33 months of treatment and has been tolerating this treatment fairly well.  He has some intermittent right upper quadrant abdominal pain.  He had repeat CT scan of the chest, abdomen and pelvis performed recently.  I personally and independently reviewed the scan and discussed the results with the patient and his wife.  His a scan showed no clear evidence for disease progression.  There was some sludge in the biliary system that could explain his pain on the right upper quadrant. I recommended for the patient to continue his current treatment with Tarceva with the same dose. I will see him back for follow-up visit in 6 weeks for evaluation with repeat blood work. He was advised  to call immediately if he has any concerning symptoms in the interval.  Disclaimer: This note was dictated with voice recognition software. Similar sounding words can inadvertently be transcribed and may be missed upon review. Eilleen Kempf, MD 05/18/17

## 2017-05-18 NOTE — Assessment & Plan Note (Signed)
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 He is currently on Tarceva 100 mg by mouth daily status post 33 months of treatment. The patient continues to tolerate this treatment fairly well with no concerning complaints. He had a recent restaging CT scan and is here to discuss the results. The patient was seen with Dr. [name].  CT scan results were discussed with the patient and his wife which showed no evidence of disease progression.  Recommend that he continue Tarceva at the current dose. His right sided pain may be related to cholelithiasis.  There is no evidence of metastatic disease in the area which he is experiencing pain. The patient will follow-up in 6 weeks for evaluation repeat lab work.  The patient voices understanding of current disease status and treatment options and is in agreement with the current care plan. All questions were answered. The patient knows to call the clinic with any problems, questions or concerns. We can certainly see the patient much sooner if necessary.

## 2017-06-08 ENCOUNTER — Telehealth: Payer: Self-pay | Admitting: Pharmacist

## 2017-06-08 ENCOUNTER — Encounter: Payer: Self-pay | Admitting: *Deleted

## 2017-06-08 ENCOUNTER — Telehealth: Payer: Self-pay | Admitting: Internal Medicine

## 2017-06-08 ENCOUNTER — Telehealth: Payer: Self-pay | Admitting: *Deleted

## 2017-06-08 DIAGNOSIS — C3431 Malignant neoplasm of lower lobe, right bronchus or lung: Secondary | ICD-10-CM

## 2017-06-08 NOTE — Telephone Encounter (Signed)
Oral Oncology Patient Advocate Encounter  Received notification from [facility] that prior authorization for Tarceva is required for April 2019.  PA submitted on CoverMyMeds Ref # [id number] Status is pending  Oral Oncology Clinic will continue to follow.   [place] Patient Advocate [phone] 06/08/2017 3:13 PM

## 2017-06-08 NOTE — Telephone Encounter (Signed)
Oral Oncology Pharmacist Encounter  Received call from patient with information about prescription insurance change that occurred at the first of the year. Patient previously with [place] insurance coverage, patient now with insurance coverage from [place].  ID: [id number] Rx BIN: [id number] PCN: [id number] Rx Group: [id number]  Prior authorization will be submitted.  Determination of prior authorization will then be faxed to  [facility] patient assistance foundation at [phone] [employer] ph: [phone]  [employer] patient ID: [id number] Service request ID: [id number]  Oral Oncology Clinic will continue to follow.  Johny Drilling, PharmD, BCPS, BCOP 06/08/2017 1:40 PM Oral Oncology Clinic [phone]

## 2017-06-08 NOTE — Telephone Encounter (Signed)
06/08/2017 Left voice mail message for patient on home phone number, requesting that he call me back at my direct number [phone] regarding follow up on research studies. Cindy S. Brigitte Pulse BSN, RN, [place] 06/08/2017 10:55 AM

## 2017-06-08 NOTE — Telephone Encounter (Signed)
06/08/2017 Received return phone call from patient this afternoon. See Research Encounter for details regarding the BMS CA209-370 quarterly follow-up. See Research Consent form encounter for details regarding the [id number]/Procurement of Biospecimens forthe Discovery and Validation for Biomarkers for the Prediction, Diagnosis and Management of Disease research project. Cindy S. Brigitte Pulse BSN, RN, [place] 06/08/2017 3:28 PM

## 2017-06-08 NOTE — Progress Notes (Signed)
06/08/2017 Spoke with patient by phone today for completion of research study Survival Follow-up visit Y03. Explained to patient that he is being contacted by phone for this visit, since he is not due to be seen in the clinic until after the study-mandated visit window. The EQ-5D patient questionnaire was administered to the patient by phone, by reading all instructions, questions and possible responses and recording the patient's responses on the Health Questionnaire. Thanked patient for his participation in the study. Cindy S. Brigitte Pulse BSN, RN, [place] 06/08/2017 3:31 PM

## 2017-06-12 NOTE — Telephone Encounter (Signed)
Oral Oncology Patient Advocate Encounter  Prior Authorization for Tarceva has been approved through [facility]   Effective dates: 06/10/2017 through 12/11/2017  A copy of this approval has been faxed to [place] at [place]. Melynda Keller, [place] Patient [place] [phone] 06/12/2017 9:51 AM

## 2017-06-14 ENCOUNTER — Other Ambulatory Visit: Payer: Self-pay | Admitting: Medical Oncology

## 2017-06-14 ENCOUNTER — Telehealth: Payer: Self-pay | Admitting: Medical Oncology

## 2017-06-14 DIAGNOSIS — C3431 Malignant neoplasm of lower lobe, right bronchus or lung: Secondary | ICD-10-CM

## 2017-06-14 MED ORDER — ERLOTINIB HCL 100 MG PO TABS: 100.0000 mg | ORAL_TABLET | Freq: Every day | ORAL | 1 refills | Status: DC

## 2017-06-14 NOTE — Telephone Encounter (Signed)
request 90 days refill tarceva -sent to [facility]-pt notified.

## 2017-06-20 ENCOUNTER — Other Ambulatory Visit: Payer: Self-pay | Admitting: *Deleted

## 2017-06-20 DIAGNOSIS — C3431 Malignant neoplasm of lower lobe, right bronchus or lung: Secondary | ICD-10-CM

## 2017-06-29 ENCOUNTER — Inpatient Hospital Stay: Payer: Medicare Other

## 2017-06-29 ENCOUNTER — Telehealth: Payer: Self-pay | Admitting: Internal Medicine

## 2017-06-29 ENCOUNTER — Inpatient Hospital Stay: Payer: Medicare Other | Admitting: *Deleted

## 2017-06-29 ENCOUNTER — Inpatient Hospital Stay: Payer: Medicare Other | Attending: Internal Medicine | Admitting: Internal Medicine

## 2017-06-29 ENCOUNTER — Encounter: Payer: Self-pay | Admitting: Internal Medicine

## 2017-06-29 VITALS — BP 131/86 | HR 63 | Temp 97.5°F | Resp 18 | Ht 73.0 in | Wt 239.3 lb

## 2017-06-29 DIAGNOSIS — E039 Hypothyroidism, unspecified: Secondary | ICD-10-CM | POA: Diagnosis not present

## 2017-06-29 DIAGNOSIS — C3431 Malignant neoplasm of lower lobe, right bronchus or lung: Secondary | ICD-10-CM

## 2017-06-29 DIAGNOSIS — Z79899 Other long term (current) drug therapy: Secondary | ICD-10-CM | POA: Diagnosis not present

## 2017-06-29 DIAGNOSIS — E7800 Pure hypercholesterolemia, unspecified: Secondary | ICD-10-CM | POA: Insufficient documentation

## 2017-06-29 DIAGNOSIS — I10 Essential (primary) hypertension: Secondary | ICD-10-CM | POA: Insufficient documentation

## 2017-06-29 DIAGNOSIS — Z7982 Long term (current) use of aspirin: Secondary | ICD-10-CM | POA: Insufficient documentation

## 2017-06-29 DIAGNOSIS — D649 Anemia, unspecified: Secondary | ICD-10-CM | POA: Insufficient documentation

## 2017-06-29 DIAGNOSIS — G4730 Sleep apnea, unspecified: Secondary | ICD-10-CM | POA: Insufficient documentation

## 2017-06-29 LAB — CMP (CANCER CENTER ONLY)
ALBUMIN: 4 g/dL (ref 3.5–5.0)
ALK PHOS: 57 U/L (ref 40–150)
ALT: 27 U/L (ref 0–55)
AST: 26 U/L (ref 5–34)
Anion gap: 7 (ref 3–11)
BILIRUBIN TOTAL: 0.4 mg/dL (ref 0.2–1.2)
BUN: 15 mg/dL (ref 7–26)
CO2: 29 mmol/L (ref 22–29)
CREATININE: 1.09 mg/dL (ref 0.70–1.30)
Calcium: 9.7 mg/dL (ref 8.4–10.4)
Chloride: 104 mmol/L (ref 98–109)
GFR, Est AFR Am: >60 mL/min (ref >60)
GFR, Estimated: >60 mL/min (ref >60)
GLUCOSE: 99 mg/dL (ref 70–140)
POTASSIUM: 4.2 mmol/L (ref 3.5–5.1)
Sodium: 140 mmol/L (ref 136–145)
TOTAL PROTEIN: 6.6 g/dL (ref 6.4–8.3)

## 2017-06-29 LAB — CBC WITH DIFFERENTIAL (CANCER CENTER ONLY)
BASOS ABS: 0 10*3/uL (ref 0.0–0.1)
Basophils Relative: 1 %
EOS ABS: 0.2 10*3/uL (ref 0.0–0.5)
Eosinophils Relative: 3 %
HCT: 37.1 % — ABNORMAL LOW (ref 38.4–49.9)
Hemoglobin: 12.3 g/dL — ABNORMAL LOW (ref 13.0–17.1)
LYMPHS PCT: 34 %
Lymphs Abs: 1.9 10*3/uL (ref 0.9–3.3)
MCH: 30.5 pg (ref 27.2–33.4)
MCHC: 33 g/dL (ref 32.0–36.0)
MCV: 92.5 fL (ref 79.3–98.0)
MONO ABS: 0.6 10*3/uL (ref 0.1–0.9)
Monocytes Relative: 11 %
Neutro Abs: 2.8 10*3/uL (ref 1.5–6.5)
Neutrophils Relative %: 51 %
Platelet Count: 224 10*3/uL (ref 140–400)
RBC: 4.01 MIL/uL — ABNORMAL LOW (ref 4.20–5.82)
RDW: 15.8 % — ABNORMAL HIGH (ref 11.0–14.6)
WBC Count: 5.5 10*3/uL (ref 4.0–10.3)

## 2017-06-29 LAB — RESEARCH LABS

## 2017-06-29 NOTE — Progress Notes (Signed)
06/29/2017 See Research Consent Form encounter, with initial date of 05/18/2017, for details regarding today's study visit. Cindy S. Brigitte Pulse BSN, RN, [facility] 06/29/2017 8:43 AM

## 2017-06-29 NOTE — Telephone Encounter (Signed)
Appointments scheduled AVS/Calendar printed per 3/28 los

## 2017-06-29 NOTE — Progress Notes (Signed)
[place] Telephone:(336) [phone]   Fax:(336) [phone]  OFFICE PROGRESS NOTE  Andrew Huddle, MD 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  PRIOR THERAPY: 1) Status post right upper lobectomy under the care of Dr. Roxan Hockey on 01/23/2006.  2) Tarceva 150 mg by mouth daily as part of the BMS checkmate 370 clinical trial. Status post 6 weeks of treatment.  CURRENT THERAPY: Tarceva 100 mg by mouth daily as part of the BMS Checkmate 370 clinical trial. First dose started 07/17/2014 status post 36 months of treatment.  INTERVAL HISTORY: [name] 72 y.o. male returns to the clinic today for follow-up visit accompanied by his wife.  The patient has no complaints today.  He denied having any chest pain, shortness of breath, cough or hemoptysis.  He denied having any weight loss or night sweats.  He has no nausea, vomiting, diarrhea or constipation.  He denied having any skin rash.  He continues to tolerate his treatment with Tarceva fairly well.  He is here today for evaluation and repeat blood work.  MEDICAL HISTORY: Past Medical History:  Diagnosis Date  . Cholelithiases 07/16/2015  . Complication of anesthesia 2007   quit breathing with bronchoscopy, had to spend night  . History of agent Orange exposure 44-45 yrs ago  . History of pneumonia  last 3-4 yrs ago   3 -4 different times  . Hypercholesterolemia   . Hypertension   . Hypothyroidism   . lung ca dx'd 2007   lung right  . Sinus disease    hx of  . Sleep apnea    uses cpap setting opf 12    ALLERGIES:  is allergic to lisinopril and prednisone.  MEDICATIONS:  Current Outpatient Medications  Medication Sig Dispense Refill  . amitriptyline (ELAVIL) 50 MG tablet Take 50 mg by mouth at bedtime. Reported on  06/18/2015    . aspirin 81 MG tablet Take 81 mg by mouth every morning.     . Cholecalciferol 2000 units CAPS Take 2,000 Units by mouth daily.     . clindamycin (CLEOCIN-T) 1 % external solution Apply 1 application topically 2 (two) times daily as needed (for skin irritation). 240 mL 0  . diltiazem (CARDIZEM CD) 360 MG 24 hr capsule Take 360 mg by mouth every morning.     . erlotinib (TARCEVA) 100 MG tablet Take 1 tablet (100 mg total) by mouth daily. Take on an empty stomach 1 hour before meals or 2 hours after.faxed to [facility] 90 tablet 1  . levETIRAcetam (KEPPRA) 750 MG tablet Take 750 mg by mouth at bedtime.     . metoprolol succinate (TOPROL-XL) 25 MG 24 hr tablet Take 25 mg by mouth every morning.     . Multiple Vitamins-Minerals (CENTRUM SILVER PO) Take 1 tablet by mouth daily.     . polyethylene glycol (MIRALAX / GLYCOLAX) packet Take 17 g by mouth daily. Reported on 08/27/2015    . psyllium (METAMUCIL) 58.6 % powder Take 1 packet by mouth daily. Reported on 08/27/2015    . ranitidine (ZANTAC) 75 MG tablet Take 75 mg by mouth 2 (two) times daily.    . rosuvastatin (CRESTOR) 5 MG tablet Take 5 mg by mouth every morning.     [name] SYNTHROID 112 MCG tablet Take 112 mcg by  mouth daily.    . valsartan-hydrochlorothiazide (DIOVAN-HCT) 320-25 MG per tablet Take 1 tablet by mouth every morning.     . nitroGLYCERIN (NITROSTAT) 0.4 MG SL tablet Place 0.4 mg under the tongue every 5 (five) minutes as needed for chest pain. Reported on 10/22/2015     No current facility-administered medications for this visit.     SURGICAL HISTORY:  Past Surgical History:  Procedure Laterality Date  . COLONOSCOPY WITH PROPOFOL N/A 03/19/2013   Procedure: COLONOSCOPY WITH PROPOFOL;  Surgeon: Garlan Fair, MD;  Location: [facility];  Service: Endoscopy;  Laterality: N/A;  . Cystourethroscopy, [facility] TURP.  04/16/2010  . NASAL SINUS SURGERY  1987  . Right video-assisted thoracoscopy, right upper lobectomy and   01/23/2006  . The Olympus video bronchoscope was introduced via the right  12/21/2005  . TONSILLECTOMY  1957  . Torn medial and lateral menisci, left knee.  11/06/2006    REVIEW OF SYSTEMS:  A comprehensive review of systems was negative.   PHYSICAL EXAMINATION: General appearance: alert, cooperative and no distress Head: Normocephalic, without obvious abnormality, atraumatic Neck: no adenopathy, no JVD, supple, symmetrical, trachea midline and thyroid not enlarged, symmetric, no tenderness/mass/nodules Lymph nodes: Cervical, supraclavicular, and axillary nodes normal. Resp: clear to auscultation bilaterally Back: symmetric, no curvature. ROM normal. No CVA tenderness. Cardio: regular rate and rhythm, S1, S2 normal, no murmur, click, rub or gallop GI: soft, non-tender; bowel sounds normal; no masses,  no organomegaly Extremities: extremities normal, atraumatic, no cyanosis or edema  ECOG PERFORMANCE STATUS: 0 - Asymptomatic  Blood pressure 131/86, pulse 63, temperature (!) 97.5 F (36.4 C), temperature source Oral, resp. rate 18, height _0  (1.854 m), weight 239 lb 4.8 oz (108.5 kg), SpO2 99 %.  LABORATORY DATA: Lab Results  Component Value Date   WBC 5.5 06/29/2017   HGB 12.0 (L) 05/15/2017   HCT 37.1 (L) 06/29/2017   MCV 92.5 06/29/2017   PLT 224 06/29/2017      Chemistry      Component Value Date/Time   NA 140 06/29/2017 0824   NA 139 04/06/2017 0932   K 4.2 06/29/2017 0824   K 3.8 04/06/2017 0932   CL 104 06/29/2017 0824   CL 102 04/24/2012 0853   CO2 29 06/29/2017 0824   CO2 29 04/06/2017 0932   BUN 15 06/29/2017 0824   BUN 14.4 04/06/2017 0932   CREATININE 1.09 06/29/2017 0824   CREATININE 1.0 04/06/2017 0932      Component Value Date/Time   CALCIUM 9.7 06/29/2017 0824   CALCIUM 9.5 04/06/2017 0932   ALKPHOS 57 06/29/2017 0824   ALKPHOS 58 04/06/2017 0932   AST 26 06/29/2017 0824   AST 27 04/06/2017 0932   ALT 27 06/29/2017 0824   ALT 27 04/06/2017  0932   BILITOT 0.4 06/29/2017 0824   BILITOT 0.45 04/06/2017 0932       RADIOGRAPHIC STUDIES: No results found.  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 He is currently on Tarceva 100 mg by mouth daily status post 36 months of treatment. The patient continues to do well and tolerating this treatment with no concerning complaints. His lab work is unremarkable except for mild anemia. I recommended for the patient to continue his current treatment with Tarceva with the same dose. I will see him back for follow-up visit in 6 weeks for evaluation and repeat  blood work. The patient was advised to call immediately if he has any concerning symptoms in the interval. The patient voices understanding of current disease status and treatment options and is in agreement with the current care plan. All questions were answered. The patient knows to call the clinic with any problems, questions or concerns. We can certainly see the patient much sooner if necessary.  Disclaimer: This note was dictated with voice recognition software. Similar sounding words can inadvertently be transcribed and may not be corrected upon review.

## 2017-08-24 ENCOUNTER — Encounter: Payer: Self-pay | Admitting: Oncology

## 2017-08-24 ENCOUNTER — Inpatient Hospital Stay (HOSPITAL_BASED_OUTPATIENT_CLINIC_OR_DEPARTMENT_OTHER): Payer: Medicare Other | Admitting: Internal Medicine

## 2017-08-24 ENCOUNTER — Inpatient Hospital Stay: Payer: Medicare Other | Attending: Internal Medicine

## 2017-08-24 ENCOUNTER — Encounter: Payer: Self-pay | Admitting: Internal Medicine

## 2017-08-24 ENCOUNTER — Telehealth: Payer: Self-pay

## 2017-08-24 VITALS — BP 143/92 | HR 69 | Temp 97.6°F | Resp 19 | Ht 73.0 in | Wt 237.8 lb

## 2017-08-24 DIAGNOSIS — Z79899 Other long term (current) drug therapy: Secondary | ICD-10-CM

## 2017-08-24 DIAGNOSIS — I10 Essential (primary) hypertension: Secondary | ICD-10-CM | POA: Insufficient documentation

## 2017-08-24 DIAGNOSIS — E7800 Pure hypercholesterolemia, unspecified: Secondary | ICD-10-CM

## 2017-08-24 DIAGNOSIS — Z7982 Long term (current) use of aspirin: Secondary | ICD-10-CM | POA: Insufficient documentation

## 2017-08-24 DIAGNOSIS — C3431 Malignant neoplasm of lower lobe, right bronchus or lung: Secondary | ICD-10-CM

## 2017-08-24 DIAGNOSIS — E039 Hypothyroidism, unspecified: Secondary | ICD-10-CM

## 2017-08-24 DIAGNOSIS — G4730 Sleep apnea, unspecified: Secondary | ICD-10-CM | POA: Diagnosis not present

## 2017-08-24 DIAGNOSIS — Z5111 Encounter for antineoplastic chemotherapy: Secondary | ICD-10-CM

## 2017-08-24 DIAGNOSIS — C3490 Malignant neoplasm of unspecified part of unspecified bronchus or lung: Secondary | ICD-10-CM

## 2017-08-24 LAB — CMP (CANCER CENTER ONLY)
ALBUMIN: 4.1 g/dL (ref 3.5–5.0)
ALT: 33 U/L (ref 0–55)
AST: 31 U/L (ref 5–34)
Alkaline Phosphatase: 61 U/L (ref 40–150)
Anion gap: 7 (ref 3–11)
BUN: 14 mg/dL (ref 7–26)
CO2: 28 mmol/L (ref 22–29)
CREATININE: 1.05 mg/dL (ref 0.70–1.30)
Calcium: 9.5 mg/dL (ref 8.4–10.4)
Chloride: 104 mmol/L (ref 98–109)
GFR, Est AFR Am: >60 mL/min (ref >60)
GFR, Estimated: >60 mL/min (ref >60)
GLUCOSE: 97 mg/dL (ref 70–140)
Potassium: 3.7 mmol/L (ref 3.5–5.1)
Sodium: 139 mmol/L (ref 136–145)
Total Bilirubin: 0.4 mg/dL (ref 0.2–1.2)
Total Protein: 7.1 g/dL (ref 6.4–8.3)

## 2017-08-24 LAB — CBC WITH DIFFERENTIAL (CANCER CENTER ONLY)
Basophils Absolute: 0 10*3/uL (ref 0.0–0.1)
Basophils Relative: 1 %
EOS ABS: 0.3 10*3/uL (ref 0.0–0.5)
Eosinophils Relative: 5 %
HEMATOCRIT: 38.4 % (ref 38.4–49.9)
Hemoglobin: 12.6 g/dL — ABNORMAL LOW (ref 13.0–17.1)
Lymphocytes Relative: 35 %
Lymphs Abs: 1.9 10*3/uL (ref 0.9–3.3)
MCH: 31 pg (ref 27.2–33.4)
MCHC: 32.8 g/dL (ref 32.0–36.0)
MCV: 94.3 fL (ref 79.3–98.0)
MONO ABS: 0.8 10*3/uL (ref 0.1–0.9)
MONOS PCT: 14 %
NEUTROS ABS: 2.5 10*3/uL (ref 1.5–6.5)
Neutrophils Relative %: 45 %
Platelet Count: 219 10*3/uL (ref 140–400)
RBC: 4.07 MIL/uL — ABNORMAL LOW (ref 4.20–5.82)
RDW: 15 % — AB (ref 11.0–14.6)
WBC Count: 5.5 10*3/uL (ref 4.0–10.3)

## 2017-08-24 NOTE — Telephone Encounter (Signed)
Printed avs and calender of upcoming appointment. Per 5/23 los, also gave patient contrast with instruction

## 2017-08-24 NOTE — Progress Notes (Signed)
[place] Telephone:([phone]   Fax:(336) [phone]  OFFICE PROGRESS NOTE  [name], MD 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  PRIOR THERAPY: 1) Status post right upper lobectomy under the care of Dr. Roxan Hockey on 01/23/2006.  2) Tarceva 150 mg by mouth daily as part of the BMS checkmate 370 clinical trial. Status post 6 weeks of treatment.  CURRENT THERAPY: Tarceva 100 mg by mouth daily as part of the BMS Checkmate 370 clinical trial. First dose started 07/17/2014 status post 37 months of treatment.  INTERVAL HISTORY: [name] 72 y.o. male returns to the clinic today for follow-up visit accompanied by his wife.  The patient is feeling fine today with no specific complaints.  He denied having any chest pain, shortness of breath except with exertion, cough or hemoptysis.  He denied having any fever or chills.  He has no nausea, vomiting, diarrhea or constipation.  He denied having any significant weight loss or night sweats.  He continues to tolerate his treatment with Tarceva fairly well.  He is here today for evaluation and repeat blood work.  MEDICAL HISTORY: Past Medical History:  Diagnosis Date  . Cholelithiases 07/16/2015  . Complication of anesthesia 2007   quit breathing with bronchoscopy, had to spend night  . History of agent Orange exposure 44-45 yrs ago  . History of pneumonia  last 3-4 yrs ago   3 -4 different times  . Hypercholesterolemia   . Hypertension   . Hypothyroidism   . lung ca dx'd 2007   lung right  . Sinus disease    hx of  . Sleep apnea    uses cpap setting opf 12    ALLERGIES:  is allergic to lisinopril and prednisone.  MEDICATIONS:  Current Outpatient Medications  Medication Sig Dispense Refill  . amitriptyline (ELAVIL)  50 MG tablet Take 50 mg by mouth at bedtime. Reported on 06/18/2015    . aspirin 81 MG tablet Take 81 mg by mouth every morning.     . Cholecalciferol 2000 units CAPS Take 2,000 Units by mouth daily.     . clindamycin (CLEOCIN-T) 1 % external solution Apply 1 application topically 2 (two) times daily as needed (for skin irritation). 240 mL 0  . diltiazem (CARDIZEM CD) 360 MG 24 hr capsule Take 360 mg by mouth every morning.     . erlotinib (TARCEVA) 100 MG tablet Take 1 tablet (100 mg total) by mouth daily. Take on an empty stomach 1 hour before meals or 2 hours after.faxed to [facility] 90 tablet 1  . levETIRAcetam (KEPPRA) 750 MG tablet Take 750 mg by mouth at bedtime.     . metoprolol succinate (TOPROL-XL) 25 MG 24 hr tablet Take 25 mg by mouth every morning.     . Multiple Vitamins-Minerals (CENTRUM SILVER PO) Take 1 tablet by mouth daily.     . nitroGLYCERIN (NITROSTAT) 0.4 MG SL tablet Place 0.4 mg under the tongue every 5 (five) minutes as needed for chest pain. Reported on 10/22/2015    . polyethylene glycol (MIRALAX / GLYCOLAX) packet Take 17 g by mouth daily. Reported on 08/27/2015    . psyllium (METAMUCIL) 58.6 % powder Take 1 packet by mouth daily. Reported on 08/27/2015    . ranitidine (ZANTAC) 75 MG tablet Take  75 mg by mouth 2 (two) times daily.    . rosuvastatin (CRESTOR) 5 MG tablet Take 5 mg by mouth every morning.     [name] SYNTHROID 112 MCG tablet Take 112 mcg by mouth daily.    . valsartan-hydrochlorothiazide (DIOVAN-HCT) 320-25 MG per tablet Take 1 tablet by mouth every morning.      No current facility-administered medications for this visit.     SURGICAL HISTORY:  Past Surgical History:  Procedure Laterality Date  . COLONOSCOPY WITH PROPOFOL N/A 03/19/2013   Procedure: COLONOSCOPY WITH PROPOFOL;  Surgeon: Garlan Fair, MD;  Location: [facility];  Service: Endoscopy;  Laterality: N/A;  . Cystourethroscopy, [facility] TURP.  04/16/2010  . NASAL SINUS SURGERY  1987  . Right  video-assisted thoracoscopy, right upper lobectomy and  01/23/2006  . The Olympus video bronchoscope was introduced via the right  12/21/2005  . TONSILLECTOMY  1957  . Torn medial and lateral menisci, left knee.  11/06/2006    REVIEW OF SYSTEMS:  A comprehensive review of systems was negative except for: Respiratory: positive for dyspnea on exertion   PHYSICAL EXAMINATION: General appearance: alert, cooperative and no distress Head: Normocephalic, without obvious abnormality, atraumatic Neck: no adenopathy, no JVD, supple, symmetrical, trachea midline and thyroid not enlarged, symmetric, no tenderness/mass/nodules Lymph nodes: Cervical, supraclavicular, and axillary nodes normal. Resp: clear to auscultation bilaterally Back: symmetric, no curvature. ROM normal. No CVA tenderness. Cardio: regular rate and rhythm, S1, S2 normal, no murmur, click, rub or gallop GI: soft, non-tender; bowel sounds normal; no masses,  no organomegaly Extremities: extremities normal, atraumatic, no cyanosis or edema  ECOG PERFORMANCE STATUS: 0 - Asymptomatic  Blood pressure (!) 143/92, pulse 69, temperature 97.6 F (36.4 C), temperature source Oral, resp. rate 19, height '6\' 1"'  (1.854 m), weight 237 lb 12.8 oz (107.9 kg), SpO2 99 %.  LABORATORY DATA: Lab Results  Component Value Date   WBC 5.5 08/24/2017   HGB 12.6 (L) 08/24/2017   HCT 38.4 08/24/2017   MCV 94.3 08/24/2017   PLT 219 08/24/2017      Chemistry      Component Value Date/Time   NA 140 06/29/2017 0824   NA 139 04/06/2017 0932   K 4.2 06/29/2017 0824   K 3.8 04/06/2017 0932   CL 104 06/29/2017 0824   CL 102 04/24/2012 0853   CO2 29 06/29/2017 0824   CO2 29 04/06/2017 0932   BUN 15 06/29/2017 0824   BUN 14.4 04/06/2017 0932   CREATININE 1.09 06/29/2017 0824   CREATININE 1.0 04/06/2017 0932      Component Value Date/Time   CALCIUM 9.7 06/29/2017 0824   CALCIUM 9.5 04/06/2017 0932   ALKPHOS 57 06/29/2017 0824   ALKPHOS 58  04/06/2017 0932   AST 26 06/29/2017 0824   AST 27 04/06/2017 0932   ALT 27 06/29/2017 0824   ALT 27 04/06/2017 0932   BILITOT 0.4 06/29/2017 0824   BILITOT 0.45 04/06/2017 0932       RADIOGRAPHIC STUDIES: No results found.  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 He is currently on Tarceva 100 mg by mouth daily status post more than 37 months of treatment. He continues to tolerate this treatment well with no concerning complaints. I recommended for the patient to continue his current treatment with Tarceva with the same dose. I will see him back for follow-up visit in  6-7 weeks after repeating CT scan of the chest, abdomen and pelvis for restaging of his disease. He was advised to call immediately if he has any concerning symptoms in the interval. The patient voices understanding of current disease status and treatment options and is in agreement with the current care plan. All questions were answered. The patient knows to call the clinic with any problems, questions or concerns. We can certainly see the patient much sooner if necessary.  Disclaimer: This note was dictated with voice recognition software. Similar sounding words can inadvertently be transcribed and may not be corrected upon review.

## 2017-08-24 NOTE — Progress Notes (Signed)
BMS [id number] study (questionnaires PRO's) Patient into the cancer center for routine visit.  Gave the questionnaires (PRO's) to the patient before any study procedures were performed.  Patient completed the PRO's.  I checked the PRO's for completeness and thanked the patient for his continued support in this clinical trial. [name] 08/24/17 - 8:30 am

## 2017-08-29 DIAGNOSIS — G43019 Migraine without aura, intractable, without status migrainosus: Secondary | ICD-10-CM | POA: Diagnosis not present

## 2017-08-29 DIAGNOSIS — G43111 Migraine with aura, intractable, with status migrainosus: Secondary | ICD-10-CM | POA: Diagnosis not present

## 2017-09-07 DIAGNOSIS — D2262 Melanocytic nevi of left upper limb, including shoulder: Secondary | ICD-10-CM | POA: Diagnosis not present

## 2017-09-07 DIAGNOSIS — D485 Neoplasm of uncertain behavior of skin: Secondary | ICD-10-CM | POA: Diagnosis not present

## 2017-09-07 DIAGNOSIS — Z1283 Encounter for screening for malignant neoplasm of skin: Secondary | ICD-10-CM | POA: Diagnosis not present

## 2017-09-07 DIAGNOSIS — L308 Other specified dermatitis: Secondary | ICD-10-CM | POA: Diagnosis not present

## 2017-09-07 DIAGNOSIS — L986 Other infiltrative disorders of the skin and subcutaneous tissue: Secondary | ICD-10-CM | POA: Diagnosis not present

## 2017-09-07 DIAGNOSIS — D225 Melanocytic nevi of trunk: Secondary | ICD-10-CM | POA: Diagnosis not present

## 2017-09-22 DIAGNOSIS — D485 Neoplasm of uncertain behavior of skin: Secondary | ICD-10-CM | POA: Diagnosis not present

## 2017-09-22 DIAGNOSIS — L988 Other specified disorders of the skin and subcutaneous tissue: Secondary | ICD-10-CM | POA: Diagnosis not present

## 2017-10-09 ENCOUNTER — Inpatient Hospital Stay: Payer: Medicare Other | Attending: Internal Medicine

## 2017-10-09 DIAGNOSIS — E7800 Pure hypercholesterolemia, unspecified: Secondary | ICD-10-CM | POA: Diagnosis not present

## 2017-10-09 DIAGNOSIS — E039 Hypothyroidism, unspecified: Secondary | ICD-10-CM | POA: Insufficient documentation

## 2017-10-09 DIAGNOSIS — C3431 Malignant neoplasm of lower lobe, right bronchus or lung: Secondary | ICD-10-CM | POA: Diagnosis not present

## 2017-10-09 DIAGNOSIS — G4730 Sleep apnea, unspecified: Secondary | ICD-10-CM | POA: Insufficient documentation

## 2017-10-09 DIAGNOSIS — Z7982 Long term (current) use of aspirin: Secondary | ICD-10-CM | POA: Diagnosis not present

## 2017-10-09 DIAGNOSIS — I10 Essential (primary) hypertension: Secondary | ICD-10-CM | POA: Diagnosis not present

## 2017-10-09 DIAGNOSIS — Z79899 Other long term (current) drug therapy: Secondary | ICD-10-CM | POA: Diagnosis not present

## 2017-10-09 DIAGNOSIS — C3490 Malignant neoplasm of unspecified part of unspecified bronchus or lung: Secondary | ICD-10-CM

## 2017-10-09 LAB — CMP (CANCER CENTER ONLY)
ALK PHOS: 61 U/L (ref 38–126)
ALT: 27 U/L (ref 0–44)
AST: 25 U/L (ref 15–41)
Albumin: 4 g/dL (ref 3.5–5.0)
Anion gap: 7 (ref 5–15)
BILIRUBIN TOTAL: 0.4 mg/dL (ref 0.3–1.2)
BUN: 16 mg/dL (ref 8–23)
CALCIUM: 9.7 mg/dL (ref 8.9–10.3)
CO2: 28 mmol/L (ref 22–32)
CREATININE: 1.12 mg/dL (ref 0.61–1.24)
Chloride: 103 mmol/L (ref 98–111)
Glucose, Bld: 104 mg/dL — ABNORMAL HIGH (ref 70–99)
Potassium: 3.7 mmol/L (ref 3.5–5.1)
Sodium: 138 mmol/L (ref 135–145)
TOTAL PROTEIN: 6.7 g/dL (ref 6.5–8.1)

## 2017-10-09 LAB — CBC WITH DIFFERENTIAL (CANCER CENTER ONLY)
BASOS PCT: 1 %
Basophils Absolute: 0 10*3/uL (ref 0.0–0.1)
EOS ABS: 0.2 10*3/uL (ref 0.0–0.5)
Eosinophils Relative: 4 %
HCT: 37.8 % — ABNORMAL LOW (ref 38.4–49.9)
HEMOGLOBIN: 12.8 g/dL — AB (ref 13.0–17.1)
Lymphocytes Relative: 33 %
Lymphs Abs: 1.9 10*3/uL (ref 0.9–3.3)
MCH: 31.9 pg (ref 27.2–33.4)
MCHC: 33.8 g/dL (ref 32.0–36.0)
MCV: 94.2 fL (ref 79.3–98.0)
Monocytes Absolute: 0.6 10*3/uL (ref 0.1–0.9)
Monocytes Relative: 11 %
NEUTROS PCT: 51 %
Neutro Abs: 3 10*3/uL (ref 1.5–6.5)
Platelet Count: 213 10*3/uL (ref 140–400)
RBC: 4.01 MIL/uL — ABNORMAL LOW (ref 4.20–5.82)
RDW: 15.7 % — AB (ref 11.0–14.6)
WBC Count: 5.7 10*3/uL (ref 4.0–10.3)

## 2017-10-10 ENCOUNTER — Encounter (HOSPITAL_COMMUNITY): Payer: Self-pay

## 2017-10-10 ENCOUNTER — Ambulatory Visit (HOSPITAL_COMMUNITY)
Admission: RE | Admit: 2017-10-10 | Discharge: 2017-10-10 | Disposition: A | Discharge status: Home / Self Care | Payer: Medicare Other | Source: Ambulatory Visit | Attending: Internal Medicine | Admitting: Internal Medicine

## 2017-10-10 DIAGNOSIS — R918 Other nonspecific abnormal finding of lung field: Secondary | ICD-10-CM | POA: Insufficient documentation

## 2017-10-10 DIAGNOSIS — K8020 Calculus of gallbladder without cholecystitis without obstruction: Secondary | ICD-10-CM | POA: Diagnosis not present

## 2017-10-10 DIAGNOSIS — I700 Atherosclerosis of aorta: Secondary | ICD-10-CM | POA: Diagnosis not present

## 2017-10-10 DIAGNOSIS — R911 Solitary pulmonary nodule: Secondary | ICD-10-CM | POA: Diagnosis not present

## 2017-10-10 DIAGNOSIS — C3490 Malignant neoplasm of unspecified part of unspecified bronchus or lung: Secondary | ICD-10-CM | POA: Diagnosis not present

## 2017-10-10 DIAGNOSIS — Z902 Acquired absence of lung [part of]: Secondary | ICD-10-CM | POA: Insufficient documentation

## 2017-10-10 MED ORDER — IOPAMIDOL (ISOVUE-300) INJECTION 61%
100.0000 mL | Freq: Once | INTRAVENOUS | Status: AC | PRN
  Administered 2017-10-10: 100 mL via INTRAVENOUS

## 2017-10-10 MED ORDER — IOPAMIDOL (ISOVUE-300) INJECTION 61%
INTRAVENOUS | Status: AC
  Filled 2017-10-10: qty 100

## 2017-10-11 NOTE — Assessment & Plan Note (Addendum)
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 He is currently on Tarceva 100 mg by mouth daily status post more than 38 months of treatment. He continues to tolerate this treatment well with no concerning complaints. He had a restaging CT scan and is here to discuss the results.  The patient was seen with Dr. Earlie Server.  CT scan results were discussed with the patient and his wife.  Images were reviewed with them.  We discussed that there is a new nodule in the right lower lobe.  The other areas were stable.  We discussed that the new nodule could represent inflammation versus progression of his cancer.  Treatment options were reviewed with the patient is wife including continuing [place] with close observation with a repeat CT scan in approximately 2 months versus proceeding with radiation to this new nodule now with continuing Tarceva versus changing his treatment.  The patient is interested in continuing on Tarceva with a CT scan in 2 months. The patient will have a restaging CT scan in 2 months and follow-up 1 to 2 days after the CT scan to discuss the results.  He was advised to call immediately if he has any concerning symptoms in the interval. The patient voices understanding of current disease status and treatment options and is in agreement with the current care plan. All questions were answered. The patient knows to call the clinic with any problems, questions or concerns. We can certainly see the patient much sooner if necessary.

## 2017-10-11 NOTE — Progress Notes (Signed)
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  PRIOR THERAPY: 1) Status post right upper lobectomy under the care of Dr. Roxan Hockey on 01/23/2006.  2) Tarceva 150 mg by mouth daily as part of the BMS checkmate 370 clinical trial. Status post 6 weeks of treatment.  CURRENT THERAPY: Tarceva 100 mg by mouth daily as part of the BMS Checkmate 370 clinical trial. First dose started 07/17/2014 status post 38 months of treatment.  INTERVAL HISTORY: PHU RECORD 72 y.o. male returns for routine follow-up visit accompanied by his wife.  The patient is feeling fine today and has no specific complaints.  He reports a nonproductive cough which is unchanged.  He denies fevers and chills.  Denies chest pain, shortness of breath, hemoptysis.  Denies nausea, vomiting, constipation, diarrhea.  Denies significant weight loss or night sweats.  Denies skin rashes.  He continues to tolerate treatment with Tarceva fairly well.  The patient had a recent restaging CT scan and is here to discuss the results.  MEDICAL HISTORY: Past Medical History:  Diagnosis Date  . Cholelithiases 07/16/2015  . Complication of anesthesia 2007   quit breathing with bronchoscopy, had to spend night  . History of agent Orange exposure 44-45 yrs ago  . History of pneumonia  last 3-4 yrs ago   3 -4 different times  . Hypercholesterolemia   . Hypertension   . Hypothyroidism   . lung ca dx'd 2007   lung right  . Sinus disease    hx of  . Sleep apnea    uses cpap setting opf 12    ALLERGIES:  is allergic to lisinopril and prednisone.  MEDICATIONS:  Current Outpatient Medications  Medication Sig Dispense Refill  . amitriptyline (ELAVIL) 50 MG tablet Take 50 mg by mouth at  bedtime. Reported on 06/18/2015    . aspirin 81 MG tablet Take 81 mg by mouth every morning.     . Cholecalciferol 2000 units CAPS Take 2,000 Units by mouth daily.     . clindamycin (CLEOCIN-T) 1 % external solution Apply 1 application topically 2 (two) times daily as needed (for skin irritation). 240 mL 0  . diltiazem (CARDIZEM CD) 360 MG 24 hr capsule Take 360 mg by mouth every morning.     . erlotinib (TARCEVA) 100 MG tablet Take 1 tablet (100 mg total) by mouth daily. Take on an empty stomach 1 hour before meals or 2 hours after.faxed to [facility] 90 tablet 1  . levETIRAcetam (KEPPRA) 750 MG tablet Take 750 mg by mouth at bedtime.     . metoprolol succinate (TOPROL-XL) 25 MG 24 hr tablet Take 25 mg by mouth every morning.     . Multiple Vitamins-Minerals (CENTRUM SILVER PO) Take 1 tablet by mouth daily.     . nitroGLYCERIN (NITROSTAT) 0.4 MG SL tablet Place 0.4 mg under the tongue every 5 (five) minutes as needed for chest pain. Reported on 10/22/2015    . polyethylene glycol (MIRALAX / GLYCOLAX) packet Take 17 g by mouth daily. Reported on 08/27/2015    . psyllium (METAMUCIL) 58.6 % powder Take 1 packet by mouth daily. Reported on 08/27/2015    . ranitidine (ZANTAC) 75 MG tablet Take 75 mg by mouth 2 (two) times daily.    [name]  rosuvastatin (CRESTOR) 5 MG tablet Take 5 mg by mouth every morning.     [name] SYNTHROID 112 MCG tablet Take 112 mcg by mouth daily.    . valsartan-hydrochlorothiazide (DIOVAN-HCT) 320-25 MG per tablet Take 1 tablet by mouth every morning.      No current facility-administered medications for this visit.     SURGICAL HISTORY:  Past Surgical History:  Procedure Laterality Date  . COLONOSCOPY WITH PROPOFOL N/A 03/19/2013   Procedure: COLONOSCOPY WITH PROPOFOL;  Surgeon: Garlan Fair, MD;  Location: [facility];  Service: Endoscopy;  Laterality: N/A;  . Cystourethroscopy, [facility].  04/16/2010  . NASAL SINUS SURGERY  1987  . Right video-assisted thoracoscopy, right  upper lobectomy and  01/23/2006  . The Olympus video bronchoscope was introduced via the right  12/21/2005  . TONSILLECTOMY  1957  . Torn medial and lateral menisci, left knee.  11/06/2006    REVIEW OF SYSTEMS:   Review of Systems  Constitutional: Negative for appetite change, chills, fatigue, fever and unexpected weight change.  HENT:   Negative for mouth sores, nosebleeds, sore throat and trouble swallowing.   Eyes: Negative for eye problems and icterus.  Respiratory: Negative for hemoptysis, shortness of breath and wheezing.  Positive for nonproductive cough which is unchanged.  Cardiovascular: Negative for chest pain and leg swelling.  Gastrointestinal: Negative for abdominal pain, constipation, diarrhea, nausea and vomiting.  Genitourinary: Negative for bladder incontinence, difficulty urinating, dysuria, frequency and hematuria.   Musculoskeletal: Negative for back pain, gait problem, neck pain and neck stiffness.  Skin: Negative for itching and rash.  Neurological: Negative for dizziness, extremity weakness, gait problem, headaches, light-headedness and seizures.  Hematological: Negative for adenopathy. Does not bruise/bleed easily.  Psychiatric/Behavioral: Negative for confusion, depression and sleep disturbance. The patient is not nervous/anxious.     PHYSICAL EXAMINATION:  Blood pressure 127/80, pulse (!) 58, temperature 98 F (36.7 C), temperature source Oral, resp. rate 17, height '6\' 1"'  (1.854 m), weight 239 lb 9.6 oz (108.7 kg), SpO2 97 %.  ECOG PERFORMANCE STATUS: 1 - Symptomatic but completely ambulatory  Physical Exam  Constitutional: Oriented to person, place, and time and well-developed, well-nourished, and in no distress. No distress.  HENT:  Head: Normocephalic and atraumatic.  Mouth/Throat: Oropharynx is clear and moist. No oropharyngeal exudate.  Eyes: Conjunctivae are normal. Right eye exhibits no discharge. Left eye exhibits no discharge. No scleral icterus.   Neck: Normal range of motion. Neck supple.  Cardiovascular: Normal rate, regular rhythm, normal heart sounds and intact distal pulses.   Pulmonary/Chest: Effort normal and breath sounds normal. No respiratory distress. No wheezes. No rales.  Abdominal: Soft. Bowel sounds are normal. Exhibits no distension and no mass. There is no tenderness.  Musculoskeletal: Normal range of motion. Exhibits no edema.  Lymphadenopathy:    No cervical adenopathy.  Neurological: Alert and oriented to person, place, and time. Exhibits normal muscle tone. Gait normal. Coordination normal.  Skin: Skin is warm and dry. No rash noted. Not diaphoretic. No erythema. No pallor.  Psychiatric: Mood, memory and judgment normal.  Vitals reviewed.  LABORATORY DATA: Lab Results  Component Value Date   WBC 5.7 10/09/2017   HGB 12.8 (L) 10/09/2017   HCT 37.8 (L) 10/09/2017   MCV 94.2 10/09/2017   PLT 213 10/09/2017      Chemistry      Component Value Date/Time   NA 138 10/09/2017 0805   NA 139 04/06/2017 0932   K 3.7 10/09/2017 0805   K 3.8  04/06/2017 0932   CL 103 10/09/2017 0805   CL 102 04/24/2012 0853   CO2 28 10/09/2017 0805   CO2 29 04/06/2017 0932   BUN 16 10/09/2017 0805   BUN 14.4 04/06/2017 0932   CREATININE 1.12 10/09/2017 0805   CREATININE 1.0 04/06/2017 0932      Component Value Date/Time   CALCIUM 9.7 10/09/2017 0805   CALCIUM 9.5 04/06/2017 0932   ALKPHOS 61 10/09/2017 0805   ALKPHOS 58 04/06/2017 0932   AST 25 10/09/2017 0805   AST 27 04/06/2017 0932   ALT 27 10/09/2017 0805   ALT 27 04/06/2017 0932   BILITOT 0.4 10/09/2017 0805   BILITOT 0.45 04/06/2017 0932       RADIOGRAPHIC STUDIES:  Ct Chest W Contrast  Result Date: 10/10/2017 CLINICAL DATA:  72 year old male with history of right-sided lung cancer diagnosed in 2007 with recurrence in 2015. Status post right upper lobectomy. Tarceva in progress. Followup study. EXAM: CT CHEST, ABDOMEN AND PELVIS WITH CONTRAST TECHNIQUE:  Multidetector CT imaging of the chest, abdomen and pelvis was performed following the standard protocol during bolus administration of intravenous contrast. CONTRAST:  151m ISOVUE-300 IOPAMIDOL (ISOVUE-300) INJECTION 61% COMPARISON:  CT the chest, abdomen and pelvis 05/15/2017. FINDINGS: CT CHEST FINDINGS Cardiovascular: Heart size is normal. There is no significant pericardial fluid, thickening or pericardial calcification. Aortic atherosclerosis. No coronary artery calcifications. Mediastinum/Nodes: No pathologically enlarged mediastinal or hilar lymph nodes. Esophagus is unremarkable in appearance. No axillary lymphadenopathy. Lungs/Pleura: Status post right upper lobectomy with compensatory hyperexpansion of the right middle and lower lobes. New well-circumscribed nodule measuring 12 x 11 mm in the superior segment of the right lower lobe (axial image 48 of series 7). Other previously noted nodules in the periphery of the right lower lobe appears slightly larger than the prior examination, best demonstrated by a 12 x 12 mm nodule on axial image 56 of series 7 which previously measured up to 8 mm. No other new suspicious appearing pulmonary nodules or masses are noted. No acute consolidative airspace disease. No pleural effusions. Musculoskeletal: There are no aggressive appearing lytic or blastic lesions noted in the visualized portions of the skeleton. CT ABDOMEN AND PELVIS FINDINGS Hepatobiliary: No suspicious cystic or solid hepatic lesions. No intra or extrahepatic biliary ductal dilatation. 2.9 cm partially calcified gallstone lying dependently in the gallbladder. Gallbladder is not distended and there are no surrounding inflammatory changes to suggest an acute cholecystitis at this time. Pancreas: No pancreatic mass. No pancreatic ductal dilatation. No pancreatic or peripancreatic fluid or inflammatory changes. Spleen: Unremarkable. Adrenals/Urinary Tract: Exophytic 2.1 cm simple cyst in the upper pole  the right kidney. Left kidney and bilateral adrenal glands are normal in appearance. There is no hydroureteronephrosis. Urinary bladder is unremarkable in appearance. Stomach/Bowel: Normal appearance of the stomach. No pathologic dilatation of small bowel or colon. Normal appendix. Vascular/Lymphatic: Aortic atherosclerosis, without evidence of aneurysm or dissection in the abdominal or pelvic vasculature. No lymphadenopathy noted in the abdomen or pelvis. Reproductive: Prostate gland and seminal vesicles are unremarkable in appearance. Other: No significant volume of ascites.  No pneumoperitoneum. Musculoskeletal: There are no aggressive appearing lytic or blastic lesions noted in the visualized portions of the skeleton. IMPRESSION: 1. Interval development of new nodule in the superior segment of the right lower lobe, as well as interval enlargement of pre-existing nodules, concerning for progression of disease. 2. No new signs of metastatic disease in the abdomen or pelvis. 3. Cholelithiasis without evidence of acute cholecystitis at this time.  4. Aortic atherosclerosis. 5. Additional incidental findings, as above, similar prior studies. Aortic Atherosclerosis (ICD10-I70.0). Electronically Signed   By: Vinnie Langton M.D.   On: 10/10/2017 12:14   Ct Abdomen Pelvis W Contrast  Result Date: 10/10/2017 CLINICAL DATA:  72 year old male with history of right-sided lung cancer diagnosed in 2007 with recurrence in 2015. Status post right upper lobectomy. Tarceva in progress. Followup study. EXAM: CT CHEST, ABDOMEN AND PELVIS WITH CONTRAST TECHNIQUE: Multidetector CT imaging of the chest, abdomen and pelvis was performed following the standard protocol during bolus administration of intravenous contrast. CONTRAST:  167m ISOVUE-300 IOPAMIDOL (ISOVUE-300) INJECTION 61% COMPARISON:  CT the chest, abdomen and pelvis 05/15/2017. FINDINGS: CT CHEST FINDINGS Cardiovascular: Heart size is normal. There is no significant  pericardial fluid, thickening or pericardial calcification. Aortic atherosclerosis. No coronary artery calcifications. Mediastinum/Nodes: No pathologically enlarged mediastinal or hilar lymph nodes. Esophagus is unremarkable in appearance. No axillary lymphadenopathy. Lungs/Pleura: Status post right upper lobectomy with compensatory hyperexpansion of the right middle and lower lobes. New well-circumscribed nodule measuring 12 x 11 mm in the superior segment of the right lower lobe (axial image 48 of series 7). Other previously noted nodules in the periphery of the right lower lobe appears slightly larger than the prior examination, best demonstrated by a 12 x 12 mm nodule on axial image 56 of series 7 which previously measured up to 8 mm. No other new suspicious appearing pulmonary nodules or masses are noted. No acute consolidative airspace disease. No pleural effusions. Musculoskeletal: There are no aggressive appearing lytic or blastic lesions noted in the visualized portions of the skeleton. CT ABDOMEN AND PELVIS FINDINGS Hepatobiliary: No suspicious cystic or solid hepatic lesions. No intra or extrahepatic biliary ductal dilatation. 2.9 cm partially calcified gallstone lying dependently in the gallbladder. Gallbladder is not distended and there are no surrounding inflammatory changes to suggest an acute cholecystitis at this time. Pancreas: No pancreatic mass. No pancreatic ductal dilatation. No pancreatic or peripancreatic fluid or inflammatory changes. Spleen: Unremarkable. Adrenals/Urinary Tract: Exophytic 2.1 cm simple cyst in the upper pole the right kidney. Left kidney and bilateral adrenal glands are normal in appearance. There is no hydroureteronephrosis. Urinary bladder is unremarkable in appearance. Stomach/Bowel: Normal appearance of the stomach. No pathologic dilatation of small bowel or colon. Normal appendix. Vascular/Lymphatic: Aortic atherosclerosis, without evidence of aneurysm or dissection in  the abdominal or pelvic vasculature. No lymphadenopathy noted in the abdomen or pelvis. Reproductive: Prostate gland and seminal vesicles are unremarkable in appearance. Other: No significant volume of ascites.  No pneumoperitoneum. Musculoskeletal: There are no aggressive appearing lytic or blastic lesions noted in the visualized portions of the skeleton. IMPRESSION: 1. Interval development of new nodule in the superior segment of the right lower lobe, as well as interval enlargement of pre-existing nodules, concerning for progression of disease. 2. No new signs of metastatic disease in the abdomen or pelvis. 3. Cholelithiasis without evidence of acute cholecystitis at this time. 4. Aortic atherosclerosis. 5. Additional incidental findings, as above, similar prior studies. Aortic Atherosclerosis (ICD10-I70.0). Electronically Signed   By: DVinnie LangtonM.D.   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 He is currently on Tarceva 100 mg by mouth daily status post more than 38 months of treatment. He continues to tolerate this treatment well with no concerning  complaints. He had a restaging CT scan and is here to discuss the results.  The patient was seen with Dr. Earlie Server.  CT scan results were discussed with the patient and his wife.  Images were reviewed with them.  We discussed that there is a new nodule in the right lower lobe.  The other areas were stable.  We discussed that the new nodule could represent inflammation versus progression of his cancer.  Treatment options were reviewed with the patient is wife including continuing [place] with close observation with a repeat CT scan in approximately 2 months versus proceeding with radiation to this new nodule now with continuing Tarceva versus changing his  treatment.  The patient is interested in continuing on Tarceva with a CT scan in 2 months. The patient will have a restaging CT scan in 2 months and follow-up 1 to 2 days after the CT scan to discuss the results.  He was advised to call immediately if he has any concerning symptoms in the interval. The patient voices understanding of current disease status and treatment options and is in agreement with the current care plan. All questions were answered. The patient knows to call the clinic with any problems, questions or concerns. We can certainly see the patient much sooner if necessary.   Orders Placed This Encounter  Procedures  . CT ABDOMEN PELVIS W CONTRAST    Standing Status:   Future    Standing Expiration Date:   10/13/2018    Order Specific Question:   If indicated for the ordered procedure, I authorize the administration of contrast media per Radiology protocol    Answer:   Yes    Order Specific Question:   Preferred imaging location?    Answer:   [facility]    Order Specific Question:   Radiology Contrast Protocol - do NOT remove file path    Answer:   [email or web address]    Order Specific Question:   ** REASON FOR EXAM (FREE TEXT)    Answer:   Lung cancer. Restaging.  . CT CHEST W CONTRAST    Standing Status:   Future    Standing Expiration Date:   10/13/2018    Order Specific Question:   If indicated for the ordered procedure, I authorize the administration of contrast media per Radiology protocol    Answer:   Yes    Order Specific Question:   Preferred imaging location?    Answer:   [facility]    Order Specific Question:   Radiology Contrast Protocol - do NOT remove file path    Answer:   [email or web address]    Order Specific Question:   ** REASON FOR EXAM (FREE TEXT)    Answer:   Lung cancer. Restaging.  [name] CBC with Differential (Cancer Center Only)    Standing Status:   Future    Standing Expiration  Date:   10/13/2018  . CMP ([facility] only)    Standing Status:   Future    Standing Expiration Date:   10/13/2018   Mikey Bussing, DNP, AGPCNP-BC, AOCNP 10/12/17  ADDENDUM: Hematology/Oncology Attending: I had a face-to-face encounter with the patient today.  I recommended his care plan.  This is a very pleasant 72 years old white male with recurrent non-small cell lung cancer, adenocarcinoma with positive EGFR mutation in exon 21 (L858R).  The patient has been on treatment with Tarceva 100 mg p.o. daily status post 38 months of treatment. Has been tolerating this treatment well with  no concerning complaints. He has recent CT scan of the chest, abdomen and pelvis for restaging of his disease.  I personally and independently reviewed the scan images and discussed the result and showed the images to the patient today.  His a scan showed development of new nodule in the superior segment of the right lower lobe measuring around 1.2 cm in size.  This is concerning for new metastatic lesion.  The patient also had a slight increase in the size of the peripheral right upper lobe nodule. I discussed several options for management of his condition including continuing his current treatment with Tarceva 100 mg p.o. daily with repeat CT scan of the chest in 2 months for restaging of his disease and further evaluation of this pulmonary nodule versus referral to radiation oncology for stereotactic radiotherapy of the new pulmonary nodules versus repeating a PET scan.  After discussion of all the options the patient would like to continue his current treatment with Tarceva for now. I will arrange for him to have repeat CT scan of the chest in 2 months for reevaluation of his disease. He was advised to call immediately if he has any concerning symptoms in the interval.  Disclaimer: This note was dictated with voice recognition software. Similar sounding words can inadvertently be transcribed and may be missed  upon review. Eilleen Kempf, MD 10/12/17

## 2017-10-12 ENCOUNTER — Encounter: Payer: Self-pay | Admitting: Oncology

## 2017-10-12 ENCOUNTER — Telehealth: Payer: Self-pay | Admitting: *Deleted

## 2017-10-12 ENCOUNTER — Inpatient Hospital Stay (HOSPITAL_BASED_OUTPATIENT_CLINIC_OR_DEPARTMENT_OTHER): Payer: Medicare Other | Admitting: Oncology

## 2017-10-12 ENCOUNTER — Telehealth: Payer: Self-pay

## 2017-10-12 VITALS — BP 127/80 | HR 58 | Temp 98.0°F | Resp 17 | Ht 73.0 in | Wt 239.6 lb

## 2017-10-12 DIAGNOSIS — C3431 Malignant neoplasm of lower lobe, right bronchus or lung: Secondary | ICD-10-CM | POA: Diagnosis not present

## 2017-10-12 DIAGNOSIS — G4730 Sleep apnea, unspecified: Secondary | ICD-10-CM | POA: Diagnosis not present

## 2017-10-12 DIAGNOSIS — E7800 Pure hypercholesterolemia, unspecified: Secondary | ICD-10-CM | POA: Diagnosis not present

## 2017-10-12 DIAGNOSIS — Z5111 Encounter for antineoplastic chemotherapy: Secondary | ICD-10-CM

## 2017-10-12 DIAGNOSIS — Z79899 Other long term (current) drug therapy: Secondary | ICD-10-CM | POA: Diagnosis not present

## 2017-10-12 DIAGNOSIS — I10 Essential (primary) hypertension: Secondary | ICD-10-CM | POA: Diagnosis not present

## 2017-10-12 DIAGNOSIS — E039 Hypothyroidism, unspecified: Secondary | ICD-10-CM | POA: Diagnosis not present

## 2017-10-12 MED ORDER — ERLOTINIB HCL 100 MG PO TABS: 100.0000 mg | ORAL_TABLET | Freq: Every day | ORAL | 1 refills | Status: DC

## 2017-10-12 NOTE — Telephone Encounter (Signed)
Printed avs and calender of upcoming appointment per 7/11 los. How ever patient prefers split days (lab D1, CT D2, and MM on D4)

## 2017-10-12 NOTE — Telephone Encounter (Signed)
Faxed Tarceva presription to [facility] [phone]

## 2017-10-23 ENCOUNTER — Other Ambulatory Visit: Payer: Self-pay | Admitting: Thoracic Surgery (Cardiothoracic Vascular Surgery)

## 2017-10-23 DIAGNOSIS — E782 Mixed hyperlipidemia: Secondary | ICD-10-CM | POA: Diagnosis not present

## 2017-10-23 DIAGNOSIS — Z79899 Other long term (current) drug therapy: Secondary | ICD-10-CM | POA: Diagnosis not present

## 2017-10-23 DIAGNOSIS — E039 Hypothyroidism, unspecified: Secondary | ICD-10-CM | POA: Diagnosis not present

## 2017-10-23 DIAGNOSIS — N400 Enlarged prostate without lower urinary tract symptoms: Secondary | ICD-10-CM | POA: Diagnosis not present

## 2017-10-23 DIAGNOSIS — I10 Essential (primary) hypertension: Secondary | ICD-10-CM | POA: Diagnosis not present

## 2017-10-23 DIAGNOSIS — C3490 Malignant neoplasm of unspecified part of unspecified bronchus or lung: Secondary | ICD-10-CM | POA: Diagnosis not present

## 2017-10-23 DIAGNOSIS — E559 Vitamin D deficiency, unspecified: Secondary | ICD-10-CM | POA: Diagnosis not present

## 2017-10-23 DIAGNOSIS — C3431 Malignant neoplasm of lower lobe, right bronchus or lung: Secondary | ICD-10-CM

## 2017-10-24 ENCOUNTER — Encounter: Payer: Self-pay | Admitting: Thoracic Surgery (Cardiothoracic Vascular Surgery)

## 2017-10-24 ENCOUNTER — Other Ambulatory Visit: Payer: Self-pay

## 2017-10-24 ENCOUNTER — Ambulatory Visit (INDEPENDENT_AMBULATORY_CARE_PROVIDER_SITE_OTHER): Payer: Medicare Other | Admitting: Thoracic Surgery (Cardiothoracic Vascular Surgery)

## 2017-10-24 ENCOUNTER — Ambulatory Visit
Admission: RE | Admit: 2017-10-24 | Discharge: 2017-10-24 | Disposition: A | Discharge status: Home / Self Care | Payer: Medicare Other | Source: Ambulatory Visit | Attending: Thoracic Surgery (Cardiothoracic Vascular Surgery) | Admitting: Thoracic Surgery (Cardiothoracic Vascular Surgery)

## 2017-10-24 VITALS — BP 131/80 | HR 60 | Resp 16 | Ht 73.0 in | Wt 239.0 lb

## 2017-10-24 DIAGNOSIS — C3431 Malignant neoplasm of lower lobe, right bronchus or lung: Secondary | ICD-10-CM

## 2017-10-24 DIAGNOSIS — Z902 Acquired absence of lung [part of]: Secondary | ICD-10-CM

## 2017-10-24 DIAGNOSIS — Z85118 Personal history of other malignant neoplasm of bronchus and lung: Secondary | ICD-10-CM | POA: Diagnosis not present

## 2017-10-24 DIAGNOSIS — R918 Other nonspecific abnormal finding of lung field: Secondary | ICD-10-CM

## 2017-10-24 DIAGNOSIS — R911 Solitary pulmonary nodule: Secondary | ICD-10-CM | POA: Diagnosis not present

## 2017-10-24 NOTE — Progress Notes (Signed)
[address]       [place], [address]             [phone]      HPI: [name] returns for a scheduled follow-up visit  [name] is a 72 year old man who is a lifelong non-smoker.  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  He developed slow-growing groundglass nodules in the right lung a couple of years ago.  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  He has been on Tarceva for over 3 years.  He recently had a follow-up scan which showed a new lung nodule and possible progression of an old nodule.  He saw Dr.Mohamed earlier this month.  He feels well.  He has not had any new respiratory issues.  His appetite is good he has not had any weight loss.  He denies any cough or hemoptysis.  Past Medical History:  Diagnosis Date  . Cholelithiases 07/16/2015  . Complication of anesthesia 2007   quit breathing with bronchoscopy, had to spend night  . History of agent Orange exposure 44-45 yrs ago  . History of pneumonia  last 3-4 yrs ago   3 -4 different times  . Hypercholesterolemia   . Hypertension   . Hypothyroidism   . lung ca dx'd 2007   lung right  . Sinus disease    hx of  . Sleep apnea    uses cpap setting opf 12    Current Outpatient Medications  Medication Sig Dispense Refill  . amitriptyline (ELAVIL) 50 MG tablet Take 50 mg by mouth at bedtime. Reported on 06/18/2015    . aspirin 81 MG tablet Take 81 mg by mouth every morning.     . Cholecalciferol 2000 units CAPS Take 2,000 Units by mouth daily.     . clindamycin (CLEOCIN-T) 1 % external solution Apply 1 application topically 2 (two) times daily as needed (for skin irritation). 240 mL 0  . diltiazem (CARDIZEM CD) 360 MG 24 hr capsule Take 360 mg by mouth every morning.     . erlotinib (TARCEVA) 100 MG tablet Take 1 tablet (100 mg total) by mouth daily. Take on an empty stomach 1 hour before meals or 2 hours after.faxed to [facility]  tablet 1  . levETIRAcetam (KEPPRA) 750 MG tablet Take 750 mg by mouth at bedtime.     . metoprolol succinate (TOPROL-XL) 25 MG 24 hr tablet Take 25 mg by mouth every morning.     . Multiple Vitamins-Minerals (CENTRUM SILVER PO) Take 1 tablet by mouth daily.     . nitroGLYCERIN (NITROSTAT) 0.4 MG SL tablet Place 0.4 mg under the tongue every 5 (five) minutes as needed for chest pain. Reported on 10/22/2015    . polyethylene glycol (MIRALAX / GLYCOLAX) packet Take 17 g by mouth daily. Reported on 08/27/2015    . psyllium (METAMUCIL) 58.6 % powder Take 1 packet by mouth daily. Reported on 08/27/2015    . ranitidine (ZANTAC) 75 MG tablet Take 75 mg by mouth 2 (two) times daily.    . rosuvastatin (CRESTOR) 5 MG tablet Take 5 mg by mouth every morning.     [name] SYNTHROID 112 MCG tablet Take 112 mcg by mouth daily.    . valsartan-hydrochlorothiazide (DIOVAN-HCT) 320-25 MG per tablet Take 1 tablet by mouth every morning.      No current facility-administered medications for this visit.  Physical Exam BP 131/80 (BP Location: Right Arm, Patient Position: Sitting, Cuff Size: Large)   Pulse 60   Resp 16   Ht '6\' 1"'  (1.854 m)   Wt 239 lb (108.4 kg)   SpO2 97% Comment: ON RA  BMI 31.48 kg/m  72 year old man in no acute distress Alert and oriented x3 with no focal deficits Lungs diminished at right base, otherwise clear No cervical or supraclavicular adenopathy Cardiac regular rate and rhythm normal S1-S2  Diagnostic Tests: CHEST - 2 VIEW  COMPARISON:  CT scan of the chest of February 11th 2019 and chest x-ray of October 26, 2015 as well as the aforementioned recent abdominal and pelvic CT scan of October 10, 2017.  FINDINGS: The lungs are adequately inflated. There are subtle irregularly marginated soft tissue density in the right mid lung. There is chronic elevation of the right hemidiaphragm. The left lung is clear. The heart and pulmonary vascularity are normal. There is no pleural effusion.  The bony thorax is unremarkable.  IMPRESSION: Subtle soft tissue density projecting in the hypoinflated right lung lying between the anterior aspects of the third and fourth ribs. This may reflect a known pulmonary nodule. Chest CT scanning is recommended.   Electronically Signed   By: David  Martinique M.D.   On: 10/24/2017 08:56 CT CHEST, ABDOMEN AND PELVIS WITH CONTRAST  TECHNIQUE: Multidetector CT imaging of the chest, abdomen and pelvis was performed following the standard protocol during bolus administration of intravenous contrast.  CONTRAST:  162m ISOVUE-300 IOPAMIDOL (ISOVUE-300) INJECTION 61%  COMPARISON:  CT the chest, abdomen and pelvis 05/15/2017.  FINDINGS: CT CHEST FINDINGS  Cardiovascular: Heart size is normal. There is no significant pericardial fluid, thickening or pericardial calcification. Aortic atherosclerosis. No coronary artery calcifications.  Mediastinum/Nodes: No pathologically enlarged mediastinal or hilar lymph nodes. Esophagus is unremarkable in appearance. No axillary lymphadenopathy.  Lungs/Pleura: Status post right upper lobectomy with compensatory hyperexpansion of the right middle and lower lobes. New well-circumscribed nodule measuring 12 x 11 mm in the superior segment of the right lower lobe (axial image 48 of series 7). Other previously noted nodules in the periphery of the right lower lobe appears slightly larger than the prior examination, best demonstrated by a 12 x 12 mm nodule on axial image 56 of series 7 which previously measured up to 8 mm. No other new suspicious appearing pulmonary nodules or masses are noted. No acute consolidative airspace disease. No pleural effusions.  Musculoskeletal: There are no aggressive appearing lytic or blastic lesions noted in the visualized portions of the skeleton.  CT ABDOMEN AND PELVIS FINDINGS  Hepatobiliary: No suspicious cystic or solid hepatic lesions. No intra or  extrahepatic biliary ductal dilatation. 2.9 cm partially calcified gallstone lying dependently in the gallbladder. Gallbladder is not distended and there are no surrounding inflammatory changes to suggest an acute cholecystitis at this time.  Pancreas: No pancreatic mass. No pancreatic ductal dilatation. No pancreatic or peripancreatic fluid or inflammatory changes.  Spleen: Unremarkable.  Adrenals/Urinary Tract: Exophytic 2.1 cm simple cyst in the upper pole the right kidney. Left kidney and bilateral adrenal glands are normal in appearance. There is no hydroureteronephrosis. Urinary bladder is unremarkable in appearance.  Stomach/Bowel: Normal appearance of the stomach. No pathologic dilatation of small bowel or colon. Normal appendix.  Vascular/Lymphatic: Aortic atherosclerosis, without evidence of aneurysm or dissection in the abdominal or pelvic vasculature. No lymphadenopathy noted in the abdomen or pelvis.  Reproductive: Prostate gland and seminal vesicles are unremarkable in appearance.  Other: No  significant volume of ascites.  No pneumoperitoneum.  Musculoskeletal: There are no aggressive appearing lytic or blastic lesions noted in the visualized portions of the skeleton.  IMPRESSION: 1. Interval development of new nodule in the superior segment of the right lower lobe, as well as interval enlargement of pre-existing nodules, concerning for progression of disease. 2. No new signs of metastatic disease in the abdomen or pelvis. 3. Cholelithiasis without evidence of acute cholecystitis at this time. 4. Aortic atherosclerosis. 5. Additional incidental findings, as above, similar prior studies.  Aortic Atherosclerosis (ICD10-I70.0).   Electronically Signed   By: Vinnie Langton M.D.   On: 10/10/2017 12:14 Personally reviewed the CT chest and chest x-ray.  The finding on chest x-ray correlates with the findings on CT done 2 weeks prior.  No need for  additional CT.  Impression: [name] is a 72 year old gentleman with a history of stage Ia adenocarcinoma resected with a right upper lobectomy in 2007.  Much later he developed multiple pulmonary nodule on the right side.  Needle biopsy showed adenocarcinoma.  Molecular testing showed the nodule was EGFR positive.  He has been on Tarceva for a little over 3 years now.  His disease had remained stable over time.  He recently had a CT which showed a new right lung nodule and possible progression in another area.  He has seen Dr. [name].  Plan was to repeat his scan in 8 weeks which will be early September.  He may be a candidate for stereotactic radiation if the nodules are persistent.  Surgical resection is not likely to be significant benefit.  I will be happy to reassess that if Dr. [name] feels its appropriate.   Plan: Follow-up as scheduled with Dr. [name] in September Return in 1 year with PA lateral chest x-ray  Melrose Nakayama, MD [facility] [phone]

## 2017-10-25 DIAGNOSIS — Z79899 Other long term (current) drug therapy: Secondary | ICD-10-CM | POA: Diagnosis not present

## 2017-10-25 DIAGNOSIS — E782 Mixed hyperlipidemia: Secondary | ICD-10-CM | POA: Diagnosis not present

## 2017-10-25 DIAGNOSIS — D649 Anemia, unspecified: Secondary | ICD-10-CM | POA: Diagnosis not present

## 2017-10-25 DIAGNOSIS — K5730 Diverticulosis of large intestine without perforation or abscess without bleeding: Secondary | ICD-10-CM | POA: Diagnosis not present

## 2017-10-25 DIAGNOSIS — I208 Other forms of angina pectoris: Secondary | ICD-10-CM | POA: Diagnosis not present

## 2017-10-25 DIAGNOSIS — G43909 Migraine, unspecified, not intractable, without status migrainosus: Secondary | ICD-10-CM | POA: Diagnosis not present

## 2017-10-25 DIAGNOSIS — C3490 Malignant neoplasm of unspecified part of unspecified bronchus or lung: Secondary | ICD-10-CM | POA: Diagnosis not present

## 2017-10-25 DIAGNOSIS — Z1389 Encounter for screening for other disorder: Secondary | ICD-10-CM | POA: Diagnosis not present

## 2017-10-25 DIAGNOSIS — I25110 Atherosclerotic heart disease of native coronary artery with unstable angina pectoris: Secondary | ICD-10-CM | POA: Diagnosis not present

## 2017-10-25 DIAGNOSIS — Z0000 Encounter for general adult medical examination without abnormal findings: Secondary | ICD-10-CM | POA: Diagnosis not present

## 2017-10-25 DIAGNOSIS — I10 Essential (primary) hypertension: Secondary | ICD-10-CM | POA: Diagnosis not present

## 2017-10-25 DIAGNOSIS — N400 Enlarged prostate without lower urinary tract symptoms: Secondary | ICD-10-CM | POA: Diagnosis not present

## 2017-10-25 DIAGNOSIS — E039 Hypothyroidism, unspecified: Secondary | ICD-10-CM | POA: Diagnosis not present

## 2017-10-25 DIAGNOSIS — E559 Vitamin D deficiency, unspecified: Secondary | ICD-10-CM | POA: Diagnosis not present

## 2017-10-26 ENCOUNTER — Telehealth: Payer: Self-pay

## 2017-10-26 NOTE — Telephone Encounter (Signed)
SENT REFERRAL TO SCHEDULING AND FILED NOTES 

## 2017-11-15 ENCOUNTER — Telehealth: Payer: Self-pay | Admitting: *Deleted

## 2017-11-15 NOTE — Telephone Encounter (Signed)
11/15/2017 Left voice mail message for patient on home phone number, requesting that he call me back at my direct number [phone] regarding research study follow-up questionnaire. Cindy S. Brigitte Pulse BSN, RN, [place] 11/15/2017 4:20 PM

## 2017-11-16 NOTE — Telephone Encounter (Signed)
11/16/2017 Received return phone call from patient today. Explained to patient that questionnaires are not needed at this follow-up time point, since he is now at the phase in follow-up where EQ-5D PRO questionnaires are only required every six months instead of every three months. Also let patient know that I was aware that Dr. [name] was currently following the new area in his lung on the CT scan since he could not confirm that it showed evidence of disease progression. Confirmed with patient that the follow-up assessments are scheduled for the second week in September. Also notified patient that I had been in correspondence with the BMS [id number] study staff regarding assessments required at evidence of progression. I explained that the study requests collection of additional biomarker samples at that time, if patient is willing. Patient states he is absolutely willing to have additional blood samples collected. Tumor samples would be submitted only if tissue collection is to be performed for further standard of care assessment, and if sufficient tissue is available for submission.  Thanked patient for his participation in this trial. Cindy S. Brigitte Pulse BSN, RN, [place] 11/16/2017 11:25 AM

## 2017-11-16 NOTE — Telephone Encounter (Signed)
11/16/2017 Left voice mail messages for patient at home and mobile phone numbers, requesting that he call me back at my direct number [phone] regarding research study follow-up. Noted that I will be in the office today and tomorrow, Thursday and Friday, but will be out of the office next week, returning on 11/27/2017. Cindy S. Brigitte Pulse BSN, RN, CCRP 11/16/2017 11:17 AM

## 2017-12-11 ENCOUNTER — Inpatient Hospital Stay: Payer: Medicare Other | Attending: Internal Medicine

## 2017-12-11 DIAGNOSIS — Z23 Encounter for immunization: Secondary | ICD-10-CM | POA: Insufficient documentation

## 2017-12-11 DIAGNOSIS — Z79899 Other long term (current) drug therapy: Secondary | ICD-10-CM | POA: Insufficient documentation

## 2017-12-11 DIAGNOSIS — Z902 Acquired absence of lung [part of]: Secondary | ICD-10-CM | POA: Insufficient documentation

## 2017-12-11 DIAGNOSIS — C3431 Malignant neoplasm of lower lobe, right bronchus or lung: Secondary | ICD-10-CM | POA: Diagnosis not present

## 2017-12-11 LAB — CBC WITH DIFFERENTIAL (CANCER CENTER ONLY)
Basophils Absolute: 0 10*3/uL (ref 0.0–0.1)
Basophils Relative: 1 %
EOS ABS: 0.2 10*3/uL (ref 0.0–0.5)
EOS PCT: 4 %
HCT: 37.8 % — ABNORMAL LOW (ref 38.4–49.9)
Hemoglobin: 12.9 g/dL — ABNORMAL LOW (ref 13.0–17.1)
LYMPHS ABS: 1.8 10*3/uL (ref 0.9–3.3)
Lymphocytes Relative: 40 %
MCH: 33 pg (ref 27.2–33.4)
MCHC: 34.1 g/dL (ref 32.0–36.0)
MCV: 96.7 fL (ref 79.3–98.0)
MONO ABS: 0.6 10*3/uL (ref 0.1–0.9)
Monocytes Relative: 14 %
Neutro Abs: 1.8 10*3/uL (ref 1.5–6.5)
Neutrophils Relative %: 41 %
PLATELETS: 199 10*3/uL (ref 140–400)
RBC: 3.91 MIL/uL — AB (ref 4.20–5.82)
RDW: 14.8 % — AB (ref 11.0–14.6)
WBC: 4.4 10*3/uL (ref 4.0–10.3)

## 2017-12-11 LAB — CMP (CANCER CENTER ONLY)
ALT: 23 U/L (ref 0–44)
AST: 25 U/L (ref 15–41)
Albumin: 3.9 g/dL (ref 3.5–5.0)
Alkaline Phosphatase: 58 U/L (ref 38–126)
Anion gap: 10 (ref 5–15)
BILIRUBIN TOTAL: 0.5 mg/dL (ref 0.3–1.2)
BUN: 15 mg/dL (ref 8–23)
CHLORIDE: 102 mmol/L (ref 98–111)
CO2: 27 mmol/L (ref 22–32)
Calcium: 9.6 mg/dL (ref 8.9–10.3)
Creatinine: 1.11 mg/dL (ref 0.61–1.24)
GFR, Est AFR Am: >60 mL/min (ref >60)
GLUCOSE: 100 mg/dL — AB (ref 70–99)
Potassium: 3.6 mmol/L (ref 3.5–5.1)
Sodium: 139 mmol/L (ref 135–145)
Total Protein: 6.5 g/dL (ref 6.5–8.1)

## 2017-12-12 ENCOUNTER — Encounter (HOSPITAL_COMMUNITY): Payer: Self-pay

## 2017-12-12 ENCOUNTER — Ambulatory Visit (HOSPITAL_COMMUNITY)
Admission: RE | Admit: 2017-12-12 | Discharge: 2017-12-12 | Disposition: A | Discharge status: Home / Self Care | Payer: Medicare Other | Source: Ambulatory Visit | Attending: Oncology | Admitting: Oncology

## 2017-12-12 DIAGNOSIS — Z902 Acquired absence of lung [part of]: Secondary | ICD-10-CM | POA: Diagnosis not present

## 2017-12-12 DIAGNOSIS — Z5111 Encounter for antineoplastic chemotherapy: Secondary | ICD-10-CM | POA: Diagnosis not present

## 2017-12-12 DIAGNOSIS — C3431 Malignant neoplasm of lower lobe, right bronchus or lung: Secondary | ICD-10-CM | POA: Diagnosis not present

## 2017-12-12 DIAGNOSIS — R918 Other nonspecific abnormal finding of lung field: Secondary | ICD-10-CM | POA: Insufficient documentation

## 2017-12-12 DIAGNOSIS — C3491 Malignant neoplasm of unspecified part of right bronchus or lung: Secondary | ICD-10-CM | POA: Diagnosis not present

## 2017-12-12 MED ORDER — IOHEXOL 300 MG/ML  SOLN
100.0000 mL | Freq: Once | INTRAMUSCULAR | Status: AC | PRN
  Administered 2017-12-12: 100 mL via INTRAVENOUS

## 2017-12-14 ENCOUNTER — Encounter: Payer: Self-pay | Admitting: Internal Medicine

## 2017-12-14 ENCOUNTER — Inpatient Hospital Stay (HOSPITAL_BASED_OUTPATIENT_CLINIC_OR_DEPARTMENT_OTHER): Payer: Medicare Other | Admitting: Internal Medicine

## 2017-12-14 ENCOUNTER — Telehealth: Payer: Self-pay | Admitting: Internal Medicine

## 2017-12-14 VITALS — BP 126/73 | HR 63 | Resp 18 | Ht 73.0 in | Wt 238.3 lb

## 2017-12-14 DIAGNOSIS — Z902 Acquired absence of lung [part of]: Secondary | ICD-10-CM

## 2017-12-14 DIAGNOSIS — Z23 Encounter for immunization: Secondary | ICD-10-CM

## 2017-12-14 DIAGNOSIS — Z79899 Other long term (current) drug therapy: Secondary | ICD-10-CM | POA: Diagnosis not present

## 2017-12-14 DIAGNOSIS — C3431 Malignant neoplasm of lower lobe, right bronchus or lung: Secondary | ICD-10-CM | POA: Diagnosis not present

## 2017-12-14 DIAGNOSIS — Z5111 Encounter for antineoplastic chemotherapy: Secondary | ICD-10-CM

## 2017-12-14 MED ORDER — INFLUENZA VAC SPLIT HIGH-DOSE 0.5 ML IM SUSY
0.5000 mL | PREFILLED_SYRINGE | INTRAMUSCULAR | Status: AC
  Administered 2017-12-14: 0.5 mL via INTRAMUSCULAR
  Filled 2017-12-14: qty 0.5

## 2017-12-14 NOTE — Addendum Note (Signed)
Addended by: Lucile Crater on: 12/14/2017 09:07 AM   Modules accepted: Orders

## 2017-12-14 NOTE — Telephone Encounter (Signed)
Scheduled appt per 9/12 los - gave patient AVS and calender per los.   

## 2017-12-14 NOTE — Progress Notes (Signed)
[facility] Telephone:([phone]   Fax:([phone]) [phone]  OFFICE PROGRESS NOTE  [name], MD 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  PRIOR THERAPY: 1) Status post right upper lobectomy under the care of Dr. Roxan Hockey on 01/23/2006.  2) Tarceva 150 mg by mouth daily as part of the BMS checkmate 370 clinical trial. Status post 6 weeks of treatment.  CURRENT THERAPY: Tarceva 100 mg by mouth daily as part of the BMS Checkmate 370 clinical trial. First dose started 07/17/2014 status post 39 months of treatment.  INTERVAL HISTORY: [name] 72 y.o. male returns to the clinic today for follow-up visit accompanied by his wife.  The patient is feeling fine today with no specific complaints except for peeling rash on the palm of his hands.  He denied having any significant diarrhea.  He has no chest pain, shortness of breath, cough or hemoptysis.  He has no weight loss or night sweats.  He has no nausea, vomiting, diarrhea or constipation.  He continues to tolerate his treatment with Tarceva fairly well with no significant adverse effects.  He had repeat CT scan of the chest, abdomen and pelvis performed recently and he is here for evaluation and discussion of his discuss results.  MEDICAL HISTORY: Past Medical History:  Diagnosis Date  . Cholelithiases 07/16/2015  . Complication of anesthesia 2007   quit breathing with bronchoscopy, had to spend night  . History of agent Orange exposure 44-45 yrs ago  . History of pneumonia  last 3-4 yrs ago   3 -4 different times  . Hypercholesterolemia   . Hypertension   . Hypothyroidism   . lung ca dx'd 2007   lung right  . Sinus disease    hx of  . Sleep apnea    uses cpap setting opf 12    ALLERGIES:  is allergic to lisinopril  and prednisone.  MEDICATIONS:  Current Outpatient Medications  Medication Sig Dispense Refill  . amitriptyline (ELAVIL) 50 MG tablet Take 50 mg by mouth at bedtime. Reported on 06/18/2015    . aspirin 81 MG tablet Take 81 mg by mouth every morning.     . Cholecalciferol 2000 units CAPS Take 2,000 Units by mouth daily.     . clindamycin (CLEOCIN-T) 1 % external solution Apply 1 application topically 2 (two) times daily as needed (for skin irritation). 240 mL 0  . diltiazem (CARDIZEM CD) 360 MG 24 hr capsule Take 360 mg by mouth every morning.     . erlotinib (TARCEVA) 100 MG tablet Take 1 tablet (100 mg total) by mouth daily. Take on an empty stomach 1 hour before meals or 2 hours after.faxed to [facility] 90 tablet 1  . levETIRAcetam (KEPPRA) 750 MG tablet Take 750 mg by mouth at bedtime.     . metoprolol succinate (TOPROL-XL) 25 MG 24 hr tablet Take 25 mg by mouth every morning.     . Multiple Vitamins-Minerals (CENTRUM SILVER PO) Take 1 tablet by mouth daily.     . nitroGLYCERIN (NITROSTAT) 0.4 MG SL tablet Place 0.4 mg under the tongue every 5 (five) minutes as needed for chest pain. Reported on 10/22/2015    . polyethylene glycol (MIRALAX / GLYCOLAX) packet Take 17 g by mouth daily. Reported on 08/27/2015    . psyllium (  METAMUCIL) 58.6 % powder Take 1 packet by mouth daily. Reported on 08/27/2015    . ranitidine (ZANTAC) 75 MG tablet Take 75 mg by mouth 2 (two) times daily.    . rosuvastatin (CRESTOR) 5 MG tablet Take 5 mg by mouth every morning.     [name] SYNTHROID 112 MCG tablet Take 112 mcg by mouth daily.    . valsartan-hydrochlorothiazide (DIOVAN-HCT) 320-25 MG per tablet Take 1 tablet by mouth every morning.      No current facility-administered medications for this visit.     SURGICAL HISTORY:  Past Surgical History:  Procedure Laterality Date  . COLONOSCOPY WITH PROPOFOL N/A 03/19/2013   Procedure: COLONOSCOPY WITH PROPOFOL;  Surgeon: Garlan Fair, MD;  Location: [facility];   Service: Endoscopy;  Laterality: N/A;  . Cystourethroscopy, [facility] T[facility].  04/16/2010  . NASAL SINUS SURGERY  1987  . Right video-assisted thoracoscopy, right upper lobectomy and  01/23/2006  . The Olympus video bronchoscope was introduced via the right  12/21/2005  . TONSILLECTOMY  1957  . Torn medial and lateral menisci, left knee.  11/06/2006    REVIEW OF SYSTEMS:  Constitutional: negative Eyes: negative Ears, nose, mouth, throat, and face: negative Respiratory: negative Cardiovascular: negative Gastrointestinal: negative Genitourinary:negative Integument/breast: positive for rash Hematologic/lymphatic: negative Musculoskeletal:negative Neurological: negative Behavioral/Psych: negative Endocrine: negative Allergic/Immunologic: negative   PHYSICAL EXAMINATION: General appearance: alert, cooperative and no distress Head: Normocephalic, without obvious abnormality, atraumatic Neck: no adenopathy, no JVD, supple, symmetrical, trachea midline and thyroid not enlarged, symmetric, no tenderness/mass/nodules Lymph nodes: Cervical, supraclavicular, and axillary nodes normal. Resp: clear to auscultation bilaterally Back: symmetric, no curvature. ROM normal. No CVA tenderness. Cardio: regular rate and rhythm, S1, S2 normal, no murmur, click, rub or gallop GI: soft, non-tender; bowel sounds normal; no masses,  no organomegaly Extremities: extremities normal, atraumatic, no cyanosis or edema Neurologic: Alert and oriented X 3, normal strength and tone. Normal symmetric reflexes. Normal coordination and gait  ECOG PERFORMANCE STATUS: 0 - Asymptomatic  Blood pressure 126/73, pulse 63, resp. rate 18, height _0  (1.854 m), weight 238 lb 4.8 oz (108.1 kg), SpO2 98 %.  LABORATORY DATA: Lab Results  Component Value Date   WBC 4.4 12/11/2017   HGB 12.9 (L) 12/11/2017   HCT 37.8 (L) 12/11/2017   MCV 96.7 12/11/2017   PLT 199 12/11/2017      Chemistry      Component Value Date/Time    NA 139 12/11/2017 0748   NA 139 04/06/2017 0932   K 3.6 12/11/2017 0748   K 3.8 04/06/2017 0932   CL 102 12/11/2017 0748   CL 102 04/24/2012 0853   CO2 27 12/11/2017 0748   CO2 29 04/06/2017 0932   BUN 15 12/11/2017 0748   BUN 14.4 04/06/2017 0932   CREATININE 1.11 12/11/2017 0748   CREATININE 1.0 04/06/2017 0932      Component Value Date/Time   CALCIUM 9.6 12/11/2017 0748   CALCIUM 9.5 04/06/2017 0932   ALKPHOS 58 12/11/2017 0748   ALKPHOS 58 04/06/2017 0932   AST 25 12/11/2017 0748   AST 27 04/06/2017 0932   ALT 23 12/11/2017 0748   ALT 27 04/06/2017 0932   BILITOT 0.5 12/11/2017 0748   BILITOT 0.45 04/06/2017 0932       RADIOGRAPHIC STUDIES: Ct Chest W Contrast  Result Date: 12/12/2017 CLINICAL DATA:  Right lung cancer, status post right upper lobectomy oral chemotherapy in progress EXAM: CT CHEST, ABDOMEN, AND PELVIS WITH CONTRAST TECHNIQUE: Multidetector CT imaging of  the chest, abdomen and pelvis was performed following the standard protocol during bolus administration of intravenous contrast. CONTRAST:  118m OMNIPAQUE IOHEXOL 300 MG/ML  SOLN COMPARISON:  10/10/2017 FINDINGS: CT CHEST FINDINGS Cardiovascular: Heart is normal in size.  No pericardial effusion. No evidence of thoracic aortic aneurysm. Mild atherosclerotic calcifications of the descending thoracic aorta. Mediastinum/Nodes: No suspicious mediastinal lymphadenopathy. Visualized thyroid is unremarkable. Lungs/Pleura: Status post right upper lobectomy. 9 mm cavitary nodule in the right lower lobe (series 6/image 35), previously a 12 mm solid nodule. Additional 23 x 9 mm nodular opacity anteriorly in the right lower lobe (series 6/image 46), previously 22 x 8 mm. 11 x 6 mm nodule in the medial right lower lobe (series 6/image 31), previously 12 x 6 mm. Mild dependent atelectasis in the bilateral lower lobes. No focal consolidation. No pleural effusion or pneumothorax. Musculoskeletal: Visualized osseous structures are  within normal limits. CT ABDOMEN PELVIS FINDINGS Hepatobiliary: Liver is within normal limits. 2.8 cm gallstone, without associated inflammatory changes. No intrahepatic or extrahepatic ductal dilatation. Pancreas: Within normal limits. Spleen: Within normal limits. Adrenals/Urinary Tract: Adrenal glands within normal limits. Kidneys are within normal limits.  No hydronephrosis. Bladder is mildly thick-walled although underdistended. Stomach/Bowel: Stomach is within normal limits. No evidence of bowel obstruction. Normal appendix (series 2/image 79). Vascular/Lymphatic: No evidence of abdominal aortic aneurysm. Atherosclerotic calcifications of the abdominal aorta and branch vessels. No suspicious abdominopelvic lymphadenopathy. Reproductive: Mild prostatomegaly. Other: No abdominopelvic ascites. Musculoskeletal: Degenerative changes of the lumbar spine. IMPRESSION: Status post right upper lobectomy. Right lower lobe nodules/metastases, including a dominant 2.3 x 0.9 cm nodular opacity anteriorly in the right lower lobe, grossly unchanged. No evidence of metastatic disease in the abdomen/pelvis. Electronically Signed   By: SJulian HyM.D.   On: 12/12/2017 12:24   Ct Abdomen Pelvis W Contrast  Result Date: 12/12/2017 CLINICAL DATA:  Right lung cancer, status post right upper lobectomy oral chemotherapy in progress EXAM: CT CHEST, ABDOMEN, AND PELVIS WITH CONTRAST TECHNIQUE: Multidetector CT imaging of the chest, abdomen and pelvis was performed following the standard protocol during bolus administration of intravenous contrast. CONTRAST:  1061mOMNIPAQUE IOHEXOL 300 MG/ML  SOLN COMPARISON:  10/10/2017 FINDINGS: CT CHEST FINDINGS Cardiovascular: Heart is normal in size.  No pericardial effusion. No evidence of thoracic aortic aneurysm. Mild atherosclerotic calcifications of the descending thoracic aorta. Mediastinum/Nodes: No suspicious mediastinal lymphadenopathy. Visualized thyroid is unremarkable.  Lungs/Pleura: Status post right upper lobectomy. 9 mm cavitary nodule in the right lower lobe (series 6/image 35), previously a 12 mm solid nodule. Additional 23 x 9 mm nodular opacity anteriorly in the right lower lobe (series 6/image 46), previously 22 x 8 mm. 11 x 6 mm nodule in the medial right lower lobe (series 6/image 31), previously 12 x 6 mm. Mild dependent atelectasis in the bilateral lower lobes. No focal consolidation. No pleural effusion or pneumothorax. Musculoskeletal: Visualized osseous structures are within normal limits. CT ABDOMEN PELVIS FINDINGS Hepatobiliary: Liver is within normal limits. 2.8 cm gallstone, without associated inflammatory changes. No intrahepatic or extrahepatic ductal dilatation. Pancreas: Within normal limits. Spleen: Within normal limits. Adrenals/Urinary Tract: Adrenal glands within normal limits. Kidneys are within normal limits.  No hydronephrosis. Bladder is mildly thick-walled although underdistended. Stomach/Bowel: Stomach is within normal limits. No evidence of bowel obstruction. Normal appendix (series 2/image 79). Vascular/Lymphatic: No evidence of abdominal aortic aneurysm. Atherosclerotic calcifications of the abdominal aorta and branch vessels. No suspicious abdominopelvic lymphadenopathy. Reproductive: Mild prostatomegaly. Other: No abdominopelvic ascites. Musculoskeletal: Degenerative changes of the  lumbar spine. IMPRESSION: Status post right upper lobectomy. Right lower lobe nodules/metastases, including a dominant 2.3 x 0.9 cm nodular opacity anteriorly in the right lower lobe, grossly unchanged. No evidence of metastatic disease in the abdomen/pelvis. Electronically Signed   By: Julian Hy M.D.   On: 12/12/2017 12:24    ASSESSMENT AND PLAN:  This is a very pleasant 72 years old white male with recurrent non-small cell lung cancer, adenocarcinoma and positive EGFR mutation in exon 21. He is currently on Tarceva 100 mg by mouth daily status post more  than 39 months of treatment. He continues to tolerate this treatment well with no concerning complaints. The patient is doing fine today with no concerning complaints.  Repeat CT scan of the chest, abdomen and pelvis showed no concerning findings for disease progression.  I discussed the scan results with the patient and his wife and recommended for him to continue his current treatment with Tarceva 100 mg p.o. daily. I will see him back for follow-up visit in 2 months for evaluation with repeat blood work. The patient will receive flu vaccine today in the clinic. He was advised to call immediately if he has any concerning symptoms in the interval. The patient voices understanding of current disease status and treatment options and is in agreement with the current care plan. All questions were answered. The patient knows to call the clinic with any problems, questions or concerns. We can certainly see the patient much sooner if necessary.  Disclaimer: This note was dictated with voice recognition software. Similar sounding words can inadvertently be transcribed and may not be corrected upon review.

## 2018-01-01 DIAGNOSIS — I209 Angina pectoris, unspecified: Secondary | ICD-10-CM | POA: Insufficient documentation

## 2018-01-01 NOTE — Progress Notes (Signed)
Cardiology Office Note:    Date:  01/02/2018   ID:  [name], DOB 22-Aug-1945, MRN [id number]  PCP:  Andrew Huddle, MD  Cardiologist:  No primary care provider on file.   Referring MD: Andrew Huddle, MD   Chief Complaint  Patient presents with  . Chest Pain  . Coronary Artery Disease    History of Present Illness:    [name] is a 72 y.o. male with a hx of non-small cell lung cancer (adenocarcinoma) 2007, right upper lobectomy 2007, Sleep apnea, essential hypertension, hyperlipidemia, now being evaluated for exertional chest pain.  He is referred by Dr. Mertha Finders for what sounds like exertional angina pectoris.  Greater than 1 year history of tightness in the chest with left arm radiation when walking up an incline at a brisk pace.  Rest relieves the discomfort.  Never any episodes of discomfort at rest or with anger.  Denies orthopnea, PND, and is not needed to use nitro.  Does not smoke.  He does have a history of right upper lung metastatic cancer confined to the lungs.    Past Medical History:  Diagnosis Date  . Cholelithiases 07/16/2015  . Complication of anesthesia 2007   quit breathing with bronchoscopy, had to spend night  . History of agent Orange exposure 44-45 yrs ago  . History of pneumonia  last 3-4 yrs ago   3 -4 different times  . Hypercholesterolemia   . Hypertension   . Hypothyroidism   . lung ca dx'd 2007   lung right  . Sinus disease    hx of  . Sleep apnea    uses cpap setting opf 12    Past Surgical History:  Procedure Laterality Date  . COLONOSCOPY WITH PROPOFOL N/A 03/19/2013   Procedure: COLONOSCOPY WITH PROPOFOL;  Surgeon: Garlan Fair, MD;  Location: [facility];  Service: Endoscopy;  Laterality: N/A;  . Cystourethroscopy, [facility].  04/16/2010  . NASAL SINUS SURGERY  1987  . Right video-assisted thoracoscopy, right upper lobectomy and  01/23/2006  . The Olympus video bronchoscope was introduced via the right   12/21/2005  . TONSILLECTOMY  1957  . Torn medial and lateral menisci, left knee.  11/06/2006    Current Medications: Current Meds  Medication Sig  . amitriptyline (ELAVIL) 50 MG tablet Take 50 mg by mouth at bedtime. Reported on 06/18/2015  . aspirin 81 MG tablet Take 81 mg by mouth every morning.   . Cholecalciferol 2000 units CAPS Take 2,000 Units by mouth daily.   . clindamycin (CLEOCIN-T) 1 % external solution Apply 1 application topically 2 (two) times daily as needed (for skin irritation).  [name] erlotinib (TARCEVA) 100 MG tablet Take 1 tablet (100 mg total) by mouth daily. Take on an empty stomach 1 hour before meals or 2 hours after.faxed to [facility]  . levETIRAcetam (KEPPRA) 750 MG tablet Take 750 mg by mouth at bedtime.   . Multiple Vitamins-Minerals (CENTRUM SILVER PO) Take 1 tablet by mouth daily.   . nitroGLYCERIN (NITROSTAT) 0.4 MG SL tablet Place 0.4 mg under the tongue every 5 (five) minutes as needed for chest pain. Reported on 10/22/2015  . polyethylene glycol (MIRALAX / GLYCOLAX) packet Take 17 g by mouth daily. Reported on 08/27/2015  . psyllium (METAMUCIL) 58.6 % powder Take 1 packet by mouth daily. Reported on 08/27/2015  . rosuvastatin (CRESTOR) 5 MG tablet Take 5 mg by mouth every morning.   [name] SYNTHROID 112 MCG tablet Take 112 mcg by  mouth daily.  . valsartan-hydrochlorothiazide (DIOVAN-HCT) 320-25 MG per tablet Take 1 tablet by mouth every morning.   . [DISCONTINUED] diltiazem (TIAZAC) 180 MG 24 hr capsule Take 180 mg by mouth 2 (two) times daily.  . [DISCONTINUED] metoprolol succinate (TOPROL-XL) 25 MG 24 hr tablet Take 25 mg by mouth every morning.      Allergies:   Lisinopril and Prednisone   Social History   Socioeconomic History  . Marital status: Married    Spouse name: Not on file  . Number of children: 0  . Years of education: Not on file  . Highest education level: Not on file  Occupational History  . Occupation: Administrator    Comment: Retired    Scientific laboratory technician  . Financial resource strain: Not on file  . Food insecurity:    Worry: Not on file    Inability: Not on file  . Transportation needs:    Medical: Not on file    Non-medical: Not on file  Tobacco Use  . Smoking status: Never Smoker  . Smokeless tobacco: Never Used  Substance and Sexual Activity  . Alcohol use: No    Alcohol/week: 0.0 standard drinks  . Drug use: No  . Sexual activity: Not Currently  Lifestyle  . Physical activity:    Days per week: Not on file    Minutes per session: Not on file  . Stress: Not on file  Relationships  . Social connections:    Talks on phone: Not on file    Gets together: Not on file    Attends religious service: Not on file    Active member of club or organization: Not on file    Attends meetings of clubs or organizations: Not on file    Relationship status: Not on file  Other Topics Concern  . Not on file  Social History Narrative  . Not on file     Family History: The patient's family history includes Heart attack in his brother and father; Hypertension in his mother.  ROS:   Please see the history of present illness.    Cough, occasional joint discomfort with walking.  Otherwise no complaints.  All other systems reviewed and are negative.  EKGs/Labs/Other Studies Reviewed:    The following studies were reviewed today: Prior heart catheterization 2003:  CONCLUSION:  1. No significant coronary atherosclerotic obstructions noted.  2. Mild luminal irregularities noted in the LAD and circumflex.  3.     Mildly elevated blood pressure.  4. Normal LV function.  EKG:  EKG is  ordered today.  The ekg ordered today demonstrates sinus rhythm, prominent voltage consistent with potential LVH, and first-degree AV block, 230 ms.  Recent Labs: 12/11/2017: ALT 23; BUN 15; Creatinine 1.11; Hemoglobin 12.9; Platelet Count 199; Potassium 3.6; Sodium 139  Recent Lipid Panel No results found for: CHOL, TRIG, HDL, CHOLHDL, VLDL,  LDLCALC, LDLDIRECT  Physical Exam:    VS:  BP 134/76   Pulse 71   Ht 6' (1.829 m)   Wt 241 lb 6.4 oz (109.5 kg)   BMI 32.74 kg/m     Wt Readings from Last 3 Encounters:  01/02/18 241 lb 6.4 oz (109.5 kg)  12/14/17 238 lb 4.8 oz (108.1 kg)  10/24/17 239 lb (108.4 kg)     GEN:  Well nourished, well developed in no acute distress HEENT: Normal NECK: No JVD. LYMPHATICS: No lymphadenopathy CARDIAC: RRR, no murmur, no gallop, no  edema. VASCULAR: 2+ and symmetric bilateral radial and  popliteal pulses.  No bruits. RESPIRATORY:  Clear to auscultation without rales, wheezing or rhonchi  ABDOMEN: Soft, non-tender, non-distended, No pulsatile mass, MUSCULOSKELETAL: No deformity  SKIN: Warm and dry NEUROLOGIC:  Alert and oriented x 3 PSYCHIATRIC:  Normal affect   ASSESSMENT:    1. Angina pectoris ([place])   2. Essential hypertension   3. Hyperlipidemia LDL goal <70   4. Malignant neoplasm of lower lobe of right lung ([place])   5. Other chest pain    PLAN:    In order of problems listed above:  1. Increase metoprolol succinate to 50 mg/day and decrease diltiazem to 180 mg/day.  Lexiscan myocardial perfusion imaging assess myocardium at risk.  Clinical follow-up in 1 month.  Renew nitroglycerin tablets.  Goal will probably be to discontinue diltiazem on the next visit.  May need to further increase metoprolol to 100 mg/day if heart rate allows. 2. Blood pressure target 130/80 mmHg or less.  Monitor blood pressure on less intense diltiazem regimen. 3. LDL target less than 70.  Clinical follow-up in 1 month.  At that point we should have the [facility] which will be used to judge severity of underlying CAD/ischemia.  We will also need to reassess blood pressure with medication changes.  He has stable angina and goal is to adjust medications to alleviate/less than impact of anginal quality of life.   Medication Adjustments/Labs and Tests Ordered: Current medicines are reviewed at  length with the patient today.  Concerns regarding medicines are outlined above.  Orders Placed This Encounter  Procedures  . MYOCARDIAL PERFUSION IMAGING  . EKG 12-Lead   Meds ordered this encounter  Medications  . diltiazem (CARDIZEM CD) 180 MG 24 hr capsule    Sig: Take 1 capsule (180 mg total) by mouth daily.    Dispense:  90 capsule    Refill:  3    Dose change  . metoprolol succinate (TOPROL-XL) 50 MG 24 hr tablet    Sig: Take 1 tablet (50 mg total) by mouth daily. Take with or immediately following a meal.    Dispense:  90 tablet    Refill:  3    Dose change    Patient Instructions  Medication Instructions:  Your physician has recommended you make the following change in your medication:   1) INCREASE Toprol - XL to 50 mg daily. (Take 2 tablets of 25 mg until finish then pick up Rx at pharmacy.)  2) DECREASE Diltiazem to 180 mg daily.   Labwork: None   Testing/Procedures: Your physician has requested that you have a lexiscan myoview. For further information please visit [email or web address]. Please follow instruction sheet, as given.    Follow-Up: Your physician recommends that you schedule a follow-up appointment in: 1 month with Dr.  Tamala Julian. (Can have 11/4 at 2pm or 11:40A on 11/8)   Any Other Special Instructions Will Be Listed Below (If Applicable).     If you need a refill on your cardiac medications before your next appointment, please call your pharmacy.      Signed, Andrew Grooms, MD  01/02/2018 12:51 PM     [facility]

## 2018-01-02 ENCOUNTER — Ambulatory Visit (INDEPENDENT_AMBULATORY_CARE_PROVIDER_SITE_OTHER): Payer: Medicare Other | Admitting: Interventional Cardiology

## 2018-01-02 ENCOUNTER — Encounter: Payer: Self-pay | Admitting: Interventional Cardiology

## 2018-01-02 ENCOUNTER — Encounter: Payer: Self-pay | Admitting: *Deleted

## 2018-01-02 VITALS — BP 134/76 | HR 71 | Ht 72.0 in | Wt 241.4 lb

## 2018-01-02 DIAGNOSIS — I10 Essential (primary) hypertension: Secondary | ICD-10-CM

## 2018-01-02 DIAGNOSIS — I209 Angina pectoris, unspecified: Secondary | ICD-10-CM | POA: Diagnosis not present

## 2018-01-02 DIAGNOSIS — E785 Hyperlipidemia, unspecified: Secondary | ICD-10-CM | POA: Diagnosis not present

## 2018-01-02 DIAGNOSIS — R0789 Other chest pain: Secondary | ICD-10-CM | POA: Diagnosis not present

## 2018-01-02 DIAGNOSIS — C3431 Malignant neoplasm of lower lobe, right bronchus or lung: Secondary | ICD-10-CM

## 2018-01-02 MED ORDER — METOPROLOL SUCCINATE ER 50 MG PO TB24: 50.0000 mg | ORAL_TABLET | Freq: Every day | ORAL | 3 refills | Status: DC

## 2018-01-02 MED ORDER — DILTIAZEM HCL ER COATED BEADS 180 MG PO CP24: 180.0000 mg | ORAL_CAPSULE | Freq: Every day | ORAL | 3 refills | Status: DC

## 2018-01-02 NOTE — Patient Instructions (Addendum)
Medication Instructions:  Your physician has recommended you make the following change in your medication:   1) INCREASE Toprol - XL to 50 mg daily. (Take 2 tablets of 25 mg until finish then pick up Rx at pharmacy.)  2) DECREASE Diltiazem to 180 mg daily.   Labwork: None   Testing/Procedures: Your physician has requested that you have a lexiscan myoview. For further information please visit [email or web address]. Please follow instruction sheet, as given.    Follow-Up: Your physician recommends that you schedule a follow-up appointment in: 1 month with Dr.  Tamala Julian. (Can have 11/4 at 2pm or 11:40A on 11/8)   Any Other Special Instructions Will Be Listed Below (If Applicable).     If you need a refill on your cardiac medications before your next appointment, please call your pharmacy.

## 2018-01-04 ENCOUNTER — Other Ambulatory Visit: Payer: Self-pay

## 2018-01-04 MED ORDER — NITROGLYCERIN 0.4 MG SL SUBL: 0.4000 mg | SUBLINGUAL_TABLET | SUBLINGUAL | 11 refills | Status: DC | PRN

## 2018-01-08 ENCOUNTER — Telehealth (HOSPITAL_COMMUNITY): Payer: Self-pay | Admitting: *Deleted

## 2018-01-08 NOTE — Telephone Encounter (Signed)
Left message on voicemail per DPR in reference to upcoming appointment scheduled on 01/10/18 with detailed instructions given per Myocardial Perfusion Study Information Sheet for the test. LM to arrive 15 minutes early, and that it is imperative to arrive on time for appointment to keep from having the test rescheduled. If you need to cancel or reschedule your appointment, please call the office within 24 hours of your appointment. Failure to do so may result in a cancellation of your appointment, and a $50 no show fee. Phone number given for call back for any questions. Kirstie Peri

## 2018-01-10 ENCOUNTER — Ambulatory Visit (HOSPITAL_COMMUNITY): Payer: Medicare Other | Attending: Cardiology

## 2018-01-10 DIAGNOSIS — R0789 Other chest pain: Secondary | ICD-10-CM | POA: Diagnosis not present

## 2018-01-10 LAB — MYOCARDIAL PERFUSION IMAGING
CHL CUP NUCLEAR SRS: 0
CHL CUP NUCLEAR SSS: 2
LVDIAVOL: 120 mL (ref 62–150)
LVSYSVOL: 48 mL
NUC STRESS TID: 1.03
Peak HR: 69 {beats}/min
Rest HR: 61 {beats}/min
SDS: 2

## 2018-01-10 MED ORDER — TECHNETIUM TC 99M TETROFOSMIN IV KIT
10.3000 | PACK | Freq: Once | INTRAVENOUS | Status: AC | PRN
  Administered 2018-01-10: 10.3 via INTRAVENOUS
  Filled 2018-01-10: qty 11

## 2018-01-10 MED ORDER — TECHNETIUM TC 99M TETROFOSMIN IV KIT
30.9000 | PACK | Freq: Once | INTRAVENOUS | Status: AC | PRN
  Administered 2018-01-10: 30.9 via INTRAVENOUS
  Filled 2018-01-10: qty 31

## 2018-01-10 MED ORDER — REGADENOSON 0.4 MG/5ML IV SOLN
0.4000 mg | Freq: Once | INTRAVENOUS | Status: AC
  Administered 2018-01-10: 0.4 mg via INTRAVENOUS

## 2018-01-16 ENCOUNTER — Inpatient Hospital Stay (HOSPITAL_BASED_OUTPATIENT_CLINIC_OR_DEPARTMENT_OTHER): Payer: Medicare Other | Admitting: Internal Medicine

## 2018-01-16 ENCOUNTER — Telehealth: Payer: Self-pay | Admitting: Internal Medicine

## 2018-01-16 ENCOUNTER — Inpatient Hospital Stay: Payer: Medicare Other | Attending: Internal Medicine

## 2018-01-16 ENCOUNTER — Encounter: Payer: Self-pay | Admitting: Internal Medicine

## 2018-01-16 VITALS — BP 127/79 | HR 64 | Temp 97.7°F | Resp 18 | Ht 72.0 in | Wt 241.5 lb

## 2018-01-16 DIAGNOSIS — Z5111 Encounter for antineoplastic chemotherapy: Secondary | ICD-10-CM

## 2018-01-16 DIAGNOSIS — Z7982 Long term (current) use of aspirin: Secondary | ICD-10-CM

## 2018-01-16 DIAGNOSIS — C3431 Malignant neoplasm of lower lobe, right bronchus or lung: Secondary | ICD-10-CM | POA: Diagnosis not present

## 2018-01-16 DIAGNOSIS — C3490 Malignant neoplasm of unspecified part of unspecified bronchus or lung: Secondary | ICD-10-CM

## 2018-01-16 DIAGNOSIS — D649 Anemia, unspecified: Secondary | ICD-10-CM

## 2018-01-16 DIAGNOSIS — I10 Essential (primary) hypertension: Secondary | ICD-10-CM | POA: Insufficient documentation

## 2018-01-16 DIAGNOSIS — Z79899 Other long term (current) drug therapy: Secondary | ICD-10-CM

## 2018-01-16 LAB — CMP (CANCER CENTER ONLY)
ALBUMIN: 3.7 g/dL (ref 3.5–5.0)
ALT: 26 U/L (ref 0–44)
AST: 28 U/L (ref 15–41)
Alkaline Phosphatase: 54 U/L (ref 38–126)
Anion gap: 6 (ref 5–15)
BUN: 12 mg/dL (ref 8–23)
CHLORIDE: 102 mmol/L (ref 98–111)
CO2: 30 mmol/L (ref 22–32)
Calcium: 9.3 mg/dL (ref 8.9–10.3)
Creatinine: 1.03 mg/dL (ref 0.61–1.24)
GFR, Est AFR Am: >60 mL/min (ref >60)
GFR, Estimated: >60 mL/min (ref >60)
GLUCOSE: 99 mg/dL (ref 70–99)
POTASSIUM: 3.9 mmol/L (ref 3.5–5.1)
Sodium: 138 mmol/L (ref 135–145)
Total Bilirubin: 0.5 mg/dL (ref 0.3–1.2)
Total Protein: 6.3 g/dL — ABNORMAL LOW (ref 6.5–8.1)

## 2018-01-16 LAB — CBC WITH DIFFERENTIAL (CANCER CENTER ONLY)
Abs Immature Granulocytes: 0.02 10*3/uL (ref 0.00–0.07)
Basophils Absolute: 0 10*3/uL (ref 0.0–0.1)
Basophils Relative: 1 %
Eosinophils Absolute: 0.2 10*3/uL (ref 0.0–0.5)
Eosinophils Relative: 5 %
HCT: 37.9 % — ABNORMAL LOW (ref 39.0–52.0)
HEMOGLOBIN: 12.8 g/dL — AB (ref 13.0–17.0)
Immature Granulocytes: 0 %
LYMPHS PCT: 38 %
Lymphs Abs: 1.9 10*3/uL (ref 0.7–4.0)
MCH: 33.2 pg (ref 26.0–34.0)
MCHC: 33.8 g/dL (ref 30.0–36.0)
MCV: 98.2 fL (ref 80.0–100.0)
MONOS PCT: 11 %
Monocytes Absolute: 0.6 10*3/uL (ref 0.1–1.0)
NEUTROS ABS: 2.3 10*3/uL (ref 1.7–7.7)
Neutrophils Relative %: 45 %
Platelet Count: 208 10*3/uL (ref 150–400)
RBC: 3.86 MIL/uL — ABNORMAL LOW (ref 4.22–5.81)
RDW: 13.4 % (ref 11.5–15.5)
WBC Count: 5.1 10*3/uL (ref 4.0–10.5)
nRBC: 0 % (ref 0.0–0.2)

## 2018-01-16 NOTE — Progress Notes (Signed)
[place] Telephone:([phone]   [phone]  OFFICE PROGRESS NOTE  [name], MD 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  PRIOR THERAPY: 1) Status post right upper lobectomy under the care of Dr. Roxan Hockey on 01/23/2006.  2) Tarceva 150 mg by mouth daily as part of the BMS checkmate 370 clinical trial. Status post 6 weeks of treatment.  CURRENT THERAPY: Tarceva 100 mg by mouth daily as part of the BMS Checkmate 370 clinical trial. First dose started 07/17/2014 status post 40 months of treatment.  INTERVAL HISTORY: [name] 72 y.o. male returns to the clinic today for follow-up visit accompanied by his wife.  The patient is feeling fine today with no concerning complaints.  He denied having any chest pain, shortness of breath but has occasional cough with no hemoptysis.  He denied having any fever or chills.  He has no nausea, vomiting, diarrhea or constipation.  He denied having any significant weight loss or night sweats.  He continues to tolerate his treatment with Tarceva fairly well.  The patient is here today for evaluation and repeat blood work.  MEDICAL HISTORY: Past Medical History:  Diagnosis Date  . Cholelithiases 07/16/2015  . Complication of anesthesia 2007   quit breathing with bronchoscopy, had to spend night  . History of agent Orange exposure 44-45 yrs ago  . History of pneumonia  last 3-4 yrs ago   3 -4 different times  . Hypercholesterolemia   . Hypertension   . Hypothyroidism   . lung ca dx'd 2007   lung right  . Sinus disease    hx of  . Sleep apnea    uses cpap setting opf 12    ALLERGIES:  is allergic to lisinopril and prednisone.  MEDICATIONS:  Current Outpatient Medications  Medication Sig Dispense Refill  .  amitriptyline (ELAVIL) 50 MG tablet Take 50 mg by mouth at bedtime. Reported on 06/18/2015    . aspirin 81 MG tablet Take 81 mg by mouth every morning.     . Cholecalciferol 2000 units CAPS Take 2,000 Units by mouth daily.     . clindamycin (CLEOCIN-T) 1 % external solution Apply 1 application topically 2 (two) times daily as needed (for skin irritation). 240 mL 0  . diltiazem (CARDIZEM CD) 180 MG 24 hr capsule Take 1 capsule (180 mg total) by mouth daily. 90 capsule 3  . erlotinib (TARCEVA) 100 MG tablet Take 1 tablet (100 mg total) by mouth daily. Take on an empty stomach 1 hour before meals or 2 hours after.faxed to [facility] 90 tablet 1  . levETIRAcetam (KEPPRA) 750 MG tablet Take 750 mg by mouth at bedtime.     . metoprolol succinate (TOPROL-XL) 50 MG 24 hr tablet Take 1 tablet (50 mg total) by mouth daily. Take with or immediately following a meal. 90 tablet 3  . Multiple Vitamins-Minerals (CENTRUM SILVER PO) Take 1 tablet by mouth daily.     . nitroGLYCERIN (NITROSTAT) 0.4 MG SL tablet Place 1 tablet (0.4 mg total) under the tongue every 5 (five) minutes as needed for chest pain. Reported on 10/22/2015 30 tablet 11  . polyethylene glycol (MIRALAX / GLYCOLAX) packet Take 17 g by mouth daily. Reported on 08/27/2015    . psyllium (METAMUCIL) 58.6 % powder Take 1  packet by mouth daily. Reported on 08/27/2015    . rosuvastatin (CRESTOR) 5 MG tablet Take 5 mg by mouth every morning.     [name] SYNTHROID 112 MCG tablet Take 112 mcg by mouth daily.    . valsartan-hydrochlorothiazide (DIOVAN-HCT) 320-25 MG per tablet Take 1 tablet by mouth every morning.      No current facility-administered medications for this visit.     SURGICAL HISTORY:  Past Surgical History:  Procedure Laterality Date  . COLONOSCOPY WITH PROPOFOL N/A 03/19/2013   Procedure: COLONOSCOPY WITH PROPOFOL;  Surgeon: Garlan Fair, MD;  Location: [facility];  Service: Endoscopy;  Laterality: N/A;  . Cystourethroscopy, [facility].   04/16/2010  . NASAL SINUS SURGERY  1987  . Right video-assisted thoracoscopy, right upper lobectomy and  01/23/2006  . The Olympus video bronchoscope was introduced via the right  12/21/2005  . TONSILLECTOMY  1957  . Torn medial and lateral menisci, left knee.  11/06/2006    REVIEW OF SYSTEMS:  A comprehensive review of systems was negative.   PHYSICAL EXAMINATION: General appearance: alert, cooperative and no distress Head: Normocephalic, without obvious abnormality, atraumatic Neck: no adenopathy, no JVD, supple, symmetrical, trachea midline and thyroid not enlarged, symmetric, no tenderness/mass/nodules Lymph nodes: Cervical, supraclavicular, and axillary nodes normal. Resp: clear to auscultation bilaterally Back: symmetric, no curvature. ROM normal. No CVA tenderness. Cardio: regular rate and rhythm, S1, S2 normal, no murmur, click, rub or gallop GI: soft, non-tender; bowel sounds normal; no masses,  no organomegaly Extremities: extremities normal, atraumatic, no cyanosis or edema  ECOG PERFORMANCE STATUS: 0 - Asymptomatic  Blood pressure 127/79, pulse 64, temperature 97.7 F (36.5 C), temperature source Oral, resp. rate 18, height 6' (1.829 m), weight 241 lb 8 oz (109.5 kg), SpO2 100 %.  LABORATORY DATA: Lab Results  Component Value Date   WBC 5.1 01/16/2018   HGB 12.8 (L) 01/16/2018   HCT 37.9 (L) 01/16/2018   MCV 98.2 01/16/2018   PLT 208 01/16/2018      Chemistry      Component Value Date/Time   NA 138 01/16/2018 0957   NA 139 04/06/2017 0932   K 3.9 01/16/2018 0957   K 3.8 04/06/2017 0932   CL 102 01/16/2018 0957   CL 102 04/24/2012 0853   CO2 30 01/16/2018 0957   CO2 29 04/06/2017 0932   BUN 12 01/16/2018 0957   BUN 14.4 04/06/2017 0932   CREATININE 1.03 01/16/2018 0957   CREATININE 1.0 04/06/2017 0932      Component Value Date/Time   CALCIUM 9.3 01/16/2018 0957   CALCIUM 9.5 04/06/2017 0932   ALKPHOS 54 01/16/2018 0957   ALKPHOS 58 04/06/2017 0932    AST 28 01/16/2018 0957   AST 27 04/06/2017 0932   ALT 26 01/16/2018 0957   ALT 27 04/06/2017 0932   BILITOT 0.5 01/16/2018 0957   BILITOT 0.45 04/06/2017 0932       RADIOGRAPHIC STUDIES: No results found.  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 He is currently on Tarceva 100 mg by mouth daily status post more than 40 months of treatment. The patient continues to tolerate his treatment well with no concerning adverse effects. Repeat blood work today is unremarkable except for mild anemia. I recommended for him to continue his current treatment with Tarceva with the same dose. I will see him back for follow-up visit  in 2 months for evaluation after repeating CT scan of the chest, abdomen and pelvis for restaging of his disease. The patient was advised to call immediately if he has any concerning symptoms in the interval. The patient voices understanding of current disease status and treatment options and is in agreement with the current care plan. All questions were answered. The patient knows to call the clinic with any problems, questions or concerns. We can certainly see the patient much sooner if necessary.  Disclaimer: This note was dictated with voice recognition software. Similar sounding words can inadvertently be transcribed and may not be corrected upon review.

## 2018-01-16 NOTE — Telephone Encounter (Signed)
Gave pt avs and calendar  °

## 2018-01-19 ENCOUNTER — Telehealth: Payer: Self-pay | Admitting: Internal Medicine

## 2018-01-19 NOTE — Telephone Encounter (Signed)
MM PAL 11/14 - appointments cancelled for 11/14 and also 11/25 (rescheduled). Patient will come for f/u 12/5 and was given updated appointments for December at 10/15 office visit.

## 2018-02-08 NOTE — Progress Notes (Signed)
Cardiology Office Note:    Date:  02/09/2018   ID:  [name], DOB Apr 13, 1945, MRN [id number]  PCP:  Andrew Huddle, MD  Cardiologist:  Andrew Grooms, MD   Referring MD: Andrew Huddle, MD   Chief Complaint  Patient presents with  . Coronary Artery Disease    Angina pectoris    History of Present Illness:    [name] is a 72 y.o. male with a hx of non-small cell lung cancer (adenocarcinoma) 2007, right upper lobectomy 2007, Sleep apnea, essential hypertension, hyperlipidemia, now being evaluated for exertional chest pain.  He is referred by Dr. Mertha Coleman for what sounds like exertional angina. Pectoris.  [name] returns today for further discussion concerning exertional dyspnea/chest tightness.  Only occurs when he walks uphill at a moderate to brisk pace.  He and his wife each walk at least 40 minutes daily.  He has not lost endurance.  For quite some time they have been noticing this symptom which is not greatly changed over the past 6-12 months.  After evaluation 1 month ago, beta-blocker therapy was increased in intensity and diltiazem therapy decreased.  No side effects or trouble since medication change.  We discussed the value of decrease heart rate response to exercise to help control ischemic symptoms.  No peripheral edema or lower extremity claudication with activity.  Feels his quality of life and exertional tolerance is not significantly limited at all by this complaint.  Past Medical History:  Diagnosis Date  . Cholelithiases 07/16/2015  . Complication of anesthesia 2007   quit breathing with bronchoscopy, had to spend night  . History of agent Orange exposure 44-45 yrs ago  . History of pneumonia  last 3-4 yrs ago   3 -4 different times  . Hypercholesterolemia   . Hypertension   . Hypothyroidism   . lung ca dx'd 2007   lung right  . Sinus disease    hx of  . Sleep apnea    uses cpap setting opf 12    Past Surgical History:  Procedure  Laterality Date  . COLONOSCOPY WITH PROPOFOL N/A 03/19/2013   Procedure: COLONOSCOPY WITH PROPOFOL;  Surgeon: Andrew Fair, MD;  Location: [facility];  Service: Endoscopy;  Laterality: N/A;  . Cystourethroscopy, [facility].  04/16/2010  . NASAL SINUS SURGERY  1987  . Right video-assisted thoracoscopy, right upper lobectomy and  01/23/2006  . The Olympus video bronchoscope was introduced via the right  12/21/2005  . TONSILLECTOMY  1957  . Torn medial and lateral menisci, left knee.  11/06/2006    Current Medications: Current Meds  Medication Sig  . amitriptyline (ELAVIL) 50 MG tablet Take 50 mg by mouth at bedtime. Reported on 06/18/2015  . aspirin 81 MG tablet Take 81 mg by mouth every morning.   . Cholecalciferol 2000 units CAPS Take 2,000 Units by mouth daily.   . clindamycin (CLEOCIN-T) 1 % external solution Apply 1 application topically 2 (two) times daily as needed (for skin irritation).  [name] diltiazem (CARDIZEM CD) 180 MG 24 hr capsule Take 1 capsule (180 mg total) by mouth daily.  [name] erlotinib (TARCEVA) 100 MG tablet Take 1 tablet (100 mg total) by mouth daily. Take on an empty stomach 1 hour before meals or 2 hours after.faxed to [facility]  . levETIRAcetam (KEPPRA) 750 MG tablet Take 750 mg by mouth at bedtime.   . metoprolol succinate (TOPROL-XL) 50 MG 24 hr tablet Take 1 tablet (50 mg total) by mouth  daily. Take with or immediately following a meal.  . Multiple Vitamins-Minerals (CENTRUM SILVER PO) Take 1 tablet by mouth daily.   . nitroGLYCERIN (NITROSTAT) 0.4 MG SL tablet Place 1 tablet (0.4 mg total) under the tongue every 5 (five) minutes as needed for chest pain. Reported on 10/22/2015  . polyethylene glycol (MIRALAX / GLYCOLAX) packet Take 17 g by mouth daily. Reported on 08/27/2015  . psyllium (METAMUCIL) 58.6 % powder Take 1 packet by mouth daily. Reported on 08/27/2015  . rosuvastatin (CRESTOR) 5 MG tablet Take 5 mg by mouth every morning.   [name] SYNTHROID 112 MCG tablet  Take 112 mcg by mouth daily.  . valsartan-hydrochlorothiazide (DIOVAN-HCT) 320-25 MG per tablet Take 1 tablet by mouth every morning.      Allergies:   Lisinopril and Prednisone   Social History   Socioeconomic History  . Marital status: Married    Spouse name: Not on file  . Number of children: 0  . Years of education: Not on file  . Highest education level: Not on file  Occupational History  . 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 Financial resource strain: Not on file  . Food insecurity:    Worry: Not on file    Inability: Not on file  . Transportation needs:    Medical: Not on file    Non-medical: Not on file  Tobacco Use  . Smoking status: Never Smoker  . Smokeless tobacco: Never Used  Substance and Sexual Activity  . Alcohol use: No    Alcohol/week: 0.0 standard drinks  . Drug use: No  . Sexual activity: Not Currently  Lifestyle  . Physical activity:    Days per week: Not on file    Minutes per session: Not on file  . Stress: Not on file  Relationships  . Social connections:    Talks on phone: Not on file    Gets together: Not on file    Attends religious service: Not on file    Active member of club or organization: Not on file    Attends meetings of clubs or organizations: Not on file    Relationship status: Not on file  Other Topics Concern  . Not on file  Social History Narrative  . Not on file     Family History: The patient's family history includes Heart attack in his brother and father; Hypertension in his mother.  ROS:   Please see the history of present illness.    He has sleep apnea.  He is compliant with CPAP.  All other systems reviewed and are negative.  EKGs/Labs/Other Studies Reviewed:    The following studies were reviewed today:  Stress Nuclear 01/2018: Study Highlights    Nuclear stress EF: 60%.  The left ventricular ejection fraction is normal (55-65%).  There was no ST segment deviation noted  during stress.  The study is normal.  This is a low risk study. No evidence of ischemia identified on perfusion imaging.     EKG:  EKG is not ordered today.    Recent Labs: 01/16/2018: ALT 26; BUN 12; Creatinine 1.03; Hemoglobin 12.8; Platelet Count 208; Potassium 3.9; Sodium 138  Recent Lipid Panel No results found for: CHOL, TRIG, HDL, CHOLHDL, VLDL, LDLCALC, LDLDIRECT  Physical Exam:    VS:  BP 132/82   Pulse 68   Ht 6' (1.829 m)   Wt 240 lb 9.6 oz (109.1 kg)   SpO2 97%   BMI 32.63  kg/m     Wt Readings from Last 3 Encounters:  02/09/18 240 lb 9.6 oz (109.1 kg)  01/16/18 241 lb 8 oz (109.5 kg)  01/10/18 241 lb (109.3 kg)     GEN:  Well nourished, well developed in no acute distress HEENT: Normal NECK: No JVD. LYMPHATICS: No lymphadenopathy CARDIAC: RRR, no murmur, no gallop, no edema. VASCULAR: 2+ bilateral radial and carotid pulses.  No bruits. RESPIRATORY:  Clear to auscultation without rales, wheezing or rhonchi  ABDOMEN: Soft, non-tender, non-distended, No pulsatile mass, MUSCULOSKELETAL: No deformity  SKIN: Warm and dry NEUROLOGIC:  Alert and oriented x 3 PSYCHIATRIC:  Normal affect   ASSESSMENT:    1. Angina pectoris ([place])   2. Essential hypertension   3. Sleep apnea, unspecified type   4. Hyperlipidemia LDL goal <70    PLAN:    In order of problems listed above:  1. Stable pattern of angina over the past 6 to 12 months with a low risk myocardial perfusion.  Plan to continue medical therapy.  Educated concerning anginal symptoms and urged to notify us if there is a noticeable change in pattern.  Achieving greater than 150 minutes of moderate aerobic activity per week. 2. Target blood pressure 130/80 mmHg is being met. 3. Advocates compliance with CPAP. 4. LDL cholesterol is well within the target range and in July was 52 on statin therapy.  Overall education and awareness concerning primary/secondary risk prevention was discussed in detail: LDL less  than 70, hemoglobin A1c less than 7, blood pressure target less than 130/80 mmHg, >150 minutes of moderate aerobic activity per week, avoidance of smoking, weight control (via diet and exercise), and continued surveillance/management of/for obstructive sleep apnea.  Medical follow-up in 8 to 12 months or if a change in anginal pattern.  Continue current medical regimen.   Medication Adjustments/Labs and Tests Ordered: Current medicines are reviewed at length with the patient today.  Concerns regarding medicines are outlined above.  No orders of the defined types were placed in this encounter.  No orders of the defined types were placed in this encounter.   Patient Instructions  Medication Instructions:  Your physician recommends that you continue on your current medications as directed. Please refer to the Current Medication list given to you today.  If you need a refill on your cardiac medications before your next appointment, please call your pharmacy.   Lab work: None If you have labs (blood work) drawn today and your tests are completely normal, you will receive your results only by: [name] MyChart Message (if you have MyChart) OR . A paper copy in the mail If you have any lab test that is abnormal or we need to change your treatment, we will call you to review the results.  Testing/Procedures: None  Follow-Up: At [facility], you and your health needs are our priority.  As part of our continuing mission to provide you with exceptional heart care, we have created designated Provider Care Teams.  These Care Teams include your primary Cardiologist (physician) and Advanced Practice Providers (APPs -  Physician Assistants and Nurse Practitioners) who all work together to provide you with the care you need, when you need it. You will need a follow up appointment in 8-12 months.  Please call our office 2 months in advance to schedule this appointment.  You may see Andrew Grooms, MD or  one of the following Advanced Practice Providers on your designated Care Team:   Truitt Merle, NP Mickel Baas  Dorene Ar, NP . Kathyrn Drown, NP  Any Other Special Instructions Will Be Listed Below (If Applicable).       Signed, Andrew Grooms, MD  02/09/2018 12:13 PM    [place]

## 2018-02-09 ENCOUNTER — Encounter: Payer: Self-pay | Admitting: Interventional Cardiology

## 2018-02-09 ENCOUNTER — Ambulatory Visit (INDEPENDENT_AMBULATORY_CARE_PROVIDER_SITE_OTHER): Payer: Medicare Other | Admitting: Interventional Cardiology

## 2018-02-09 VITALS — BP 132/82 | HR 68 | Ht 72.0 in | Wt 240.6 lb

## 2018-02-09 DIAGNOSIS — G4730 Sleep apnea, unspecified: Secondary | ICD-10-CM | POA: Diagnosis not present

## 2018-02-09 DIAGNOSIS — E785 Hyperlipidemia, unspecified: Secondary | ICD-10-CM

## 2018-02-09 DIAGNOSIS — I209 Angina pectoris, unspecified: Secondary | ICD-10-CM

## 2018-02-09 DIAGNOSIS — I10 Essential (primary) hypertension: Secondary | ICD-10-CM | POA: Diagnosis not present

## 2018-02-09 NOTE — Patient Instructions (Signed)
Medication Instructions:  Your physician recommends that you continue on your current medications as directed. Please refer to the Current Medication list given to you today.  If you need a refill on your cardiac medications before your next appointment, please call your pharmacy.   Lab work: None If you have labs (blood work) drawn today and your tests are completely normal, you will receive your results only by: [name] MyChart Message (if you have MyChart) OR . A paper copy in the mail If you have any lab test that is abnormal or we need to change your treatment, we will call you to review the results.  Testing/Procedures: None  Follow-Up: At [facility], you and your health needs are our priority.  As part of our continuing mission to provide you with exceptional heart care, we have created designated Provider Care Teams.  These Care Teams include your primary Cardiologist (physician) and Advanced Practice Providers (APPs -  Physician Assistants and Nurse Practitioners) who all work together to provide you with the care you need, when you need it. You will need a follow up appointment in 8-12 months.  Please call our office 2 months in advance to schedule this appointment.  You may see Sinclair Grooms, MD or one of the following Advanced Practice Providers on your designated Care Team:   Truitt Merle, NP Cecilie Kicks, NP . Kathyrn Drown, NP  Any Other Special Instructions Will Be Listed Below (If Applicable).

## 2018-02-13 ENCOUNTER — Encounter: Payer: Self-pay | Admitting: *Deleted

## 2018-02-13 DIAGNOSIS — C3431 Malignant neoplasm of lower lobe, right bronchus or lung: Secondary | ICD-10-CM

## 2018-02-13 DIAGNOSIS — G43111 Migraine with aura, intractable, with status migrainosus: Secondary | ICD-10-CM | POA: Diagnosis not present

## 2018-02-13 DIAGNOSIS — G43019 Migraine without aura, intractable, without status migrainosus: Secondary | ICD-10-CM | POA: Diagnosis not present

## 2018-02-13 NOTE — Research (Addendum)
02/13/2018 Research - [facility] [id number] Survival Follow-up visit Y06 Spoke with patient by phone today for completion of research study Survival Follow-up visit Y06. Explained to patient that he is being contacted by phone for this visit, since he is not due to be seen in the clinic until after the study-mandated visit window. The EQ-5D patient questionnaire was administered to the patient by phone, by reading all instructions, questions and possible responses and recording the patient's responses on the Health Questionnaire. Thanked patient for his participation in the study. Patient inquired about his upcoming Medicare coverage selection, noting that Tarceva is not listed as a covered medication for any of the Medicare plans in [place]. In Basket message sent through [facility] to his provider, provider's nurses, and the pharmacy specialists who have worked with him in the past, so that he can be provided with more information in selecting his coverage. Cindy S. Brigitte Pulse BSN, RN, CCRP 02/13/2018 3:52 PM

## 2018-02-15 ENCOUNTER — Other Ambulatory Visit: Payer: Medicare Other

## 2018-02-15 ENCOUNTER — Ambulatory Visit: Payer: Medicare Other | Admitting: Internal Medicine

## 2018-02-15 ENCOUNTER — Telehealth: Payer: Self-pay

## 2018-02-15 NOTE — Telephone Encounter (Signed)
Oral Oncology Patient Advocate Encounter  I received a staff message that the patient had some concerns with needing to change his Medicare part D plan and having the [place] covered.   I called the patient and I let him know that we currently do a prior authorization for his Tarceva to be covered and that would stay the same as long as he kept medicare part D coverage. I wrote him down in my calendar to check his new insurance on 04/04/18 and do the PA then.  [name] verbalized understanding and appreciation.   [place] Patient [place] Phone [phone] Fax [phone]

## 2018-02-26 ENCOUNTER — Ambulatory Visit: Payer: Medicare Other | Admitting: Internal Medicine

## 2018-02-26 ENCOUNTER — Other Ambulatory Visit: Payer: Medicare Other

## 2018-03-05 ENCOUNTER — Inpatient Hospital Stay: Payer: Medicare Other | Attending: Internal Medicine

## 2018-03-05 DIAGNOSIS — Z7982 Long term (current) use of aspirin: Secondary | ICD-10-CM | POA: Insufficient documentation

## 2018-03-05 DIAGNOSIS — C3490 Malignant neoplasm of unspecified part of unspecified bronchus or lung: Secondary | ICD-10-CM

## 2018-03-05 DIAGNOSIS — I10 Essential (primary) hypertension: Secondary | ICD-10-CM | POA: Diagnosis not present

## 2018-03-05 DIAGNOSIS — C3411 Malignant neoplasm of upper lobe, right bronchus or lung: Secondary | ICD-10-CM | POA: Insufficient documentation

## 2018-03-05 DIAGNOSIS — Z79899 Other long term (current) drug therapy: Secondary | ICD-10-CM | POA: Insufficient documentation

## 2018-03-05 DIAGNOSIS — C3431 Malignant neoplasm of lower lobe, right bronchus or lung: Secondary | ICD-10-CM | POA: Diagnosis present

## 2018-03-05 DIAGNOSIS — Z902 Acquired absence of lung [part of]: Secondary | ICD-10-CM | POA: Diagnosis not present

## 2018-03-05 LAB — CBC WITH DIFFERENTIAL (CANCER CENTER ONLY)
Abs Immature Granulocytes: 0.01 10*3/uL (ref 0.00–0.07)
BASOS ABS: 0 10*3/uL (ref 0.0–0.1)
Basophils Relative: 0 %
EOS ABS: 0.2 10*3/uL (ref 0.0–0.5)
EOS PCT: 4 %
HEMATOCRIT: 41.6 % (ref 39.0–52.0)
Hemoglobin: 14.1 g/dL (ref 13.0–17.0)
Immature Granulocytes: 0 %
Lymphocytes Relative: 38 %
Lymphs Abs: 2.1 10*3/uL (ref 0.7–4.0)
MCH: 32.9 pg (ref 26.0–34.0)
MCHC: 33.9 g/dL (ref 30.0–36.0)
MCV: 97.2 fL (ref 80.0–100.0)
MONO ABS: 0.6 10*3/uL (ref 0.1–1.0)
Monocytes Relative: 11 %
Neutro Abs: 2.6 10*3/uL (ref 1.7–7.7)
Neutrophils Relative %: 47 %
Platelet Count: 204 10*3/uL (ref 150–400)
RBC: 4.28 MIL/uL (ref 4.22–5.81)
RDW: 12.8 % (ref 11.5–15.5)
WBC Count: 5.5 10*3/uL (ref 4.0–10.5)
nRBC: 0 % (ref 0.0–0.2)

## 2018-03-05 LAB — CMP (CANCER CENTER ONLY)
ALK PHOS: 58 U/L (ref 38–126)
ALT: 31 U/L (ref 0–44)
ANION GAP: 9 (ref 5–15)
AST: 30 U/L (ref 15–41)
Albumin: 4.1 g/dL (ref 3.5–5.0)
BILIRUBIN TOTAL: 0.5 mg/dL (ref 0.3–1.2)
BUN: 13 mg/dL (ref 8–23)
CALCIUM: 9.7 mg/dL (ref 8.9–10.3)
CO2: 27 mmol/L (ref 22–32)
Chloride: 105 mmol/L (ref 98–111)
Creatinine: 1 mg/dL (ref 0.61–1.24)
GFR, Est AFR Am: >60 mL/min (ref >60)
GFR, Estimated: >60 mL/min (ref >60)
Glucose, Bld: 104 mg/dL — ABNORMAL HIGH (ref 70–99)
Potassium: 3.6 mmol/L (ref 3.5–5.1)
Sodium: 141 mmol/L (ref 135–145)
TOTAL PROTEIN: 6.8 g/dL (ref 6.5–8.1)

## 2018-03-06 ENCOUNTER — Ambulatory Visit (HOSPITAL_COMMUNITY)
Admission: RE | Admit: 2018-03-06 | Discharge: 2018-03-06 | Disposition: A | Discharge status: Home / Self Care | Payer: Medicare Other | Source: Ambulatory Visit | Attending: Internal Medicine | Admitting: Internal Medicine

## 2018-03-06 ENCOUNTER — Encounter (HOSPITAL_COMMUNITY): Payer: Self-pay

## 2018-03-06 DIAGNOSIS — C3490 Malignant neoplasm of unspecified part of unspecified bronchus or lung: Secondary | ICD-10-CM | POA: Insufficient documentation

## 2018-03-06 MED ORDER — SODIUM CHLORIDE (PF) 0.9 % IJ SOLN
INTRAMUSCULAR | Status: AC
  Filled 2018-03-06: qty 50

## 2018-03-06 MED ORDER — IOHEXOL 300 MG/ML  SOLN
100.0000 mL | Freq: Once | INTRAMUSCULAR | Status: AC | PRN
  Administered 2018-03-06: 100 mL via INTRAVENOUS

## 2018-03-08 ENCOUNTER — Encounter: Payer: Self-pay | Admitting: Internal Medicine

## 2018-03-08 ENCOUNTER — Telehealth: Payer: Self-pay

## 2018-03-08 ENCOUNTER — Inpatient Hospital Stay (HOSPITAL_BASED_OUTPATIENT_CLINIC_OR_DEPARTMENT_OTHER): Payer: Medicare Other | Admitting: Internal Medicine

## 2018-03-08 VITALS — BP 131/90 | HR 68 | Temp 97.8°F | Resp 20 | Ht 73.0 in | Wt 239.7 lb

## 2018-03-08 DIAGNOSIS — I10 Essential (primary) hypertension: Secondary | ICD-10-CM

## 2018-03-08 DIAGNOSIS — C3431 Malignant neoplasm of lower lobe, right bronchus or lung: Secondary | ICD-10-CM

## 2018-03-08 DIAGNOSIS — Z79899 Other long term (current) drug therapy: Secondary | ICD-10-CM

## 2018-03-08 DIAGNOSIS — Z902 Acquired absence of lung [part of]: Secondary | ICD-10-CM | POA: Diagnosis not present

## 2018-03-08 DIAGNOSIS — Z5111 Encounter for antineoplastic chemotherapy: Secondary | ICD-10-CM

## 2018-03-08 DIAGNOSIS — C3411 Malignant neoplasm of upper lobe, right bronchus or lung: Secondary | ICD-10-CM | POA: Diagnosis not present

## 2018-03-08 DIAGNOSIS — E039 Hypothyroidism, unspecified: Secondary | ICD-10-CM

## 2018-03-08 DIAGNOSIS — Z7982 Long term (current) use of aspirin: Secondary | ICD-10-CM | POA: Diagnosis not present

## 2018-03-08 NOTE — Telephone Encounter (Signed)
Printed avs and calender of upcoming appointment. Per 12/5 los. Patient refused to have labs scheduled on a separate day

## 2018-03-08 NOTE — Progress Notes (Signed)
[place] Telephone:(336) [phone]   Fax:(336) [phone]  OFFICE PROGRESS NOTE  [name], MD 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  PRIOR THERAPY: 1) Status post right upper lobectomy under the care of Dr. Roxan Hockey on 01/23/2006.  2) Tarceva 150 mg by mouth daily as part of the BMS checkmate 370 clinical trial. Status post 6 weeks of treatment.  CURRENT THERAPY: Tarceva 100 mg by mouth daily as part of the BMS Checkmate 370 clinical trial. First dose started 07/17/2014 status post 40 months of treatment.  INTERVAL HISTORY: [name] 72 y.o. male returns to the clinic today for follow-up visit accompanied by his wife.  The patient is feeling fine today with no concerning complaints.  He denied having any chest pain, shortness of breath but has occasional cough with no hemoptysis.  He denied having any fever or chills.  He has no nausea, vomiting, diarrhea or constipation.  He denied having any significant weight loss or night sweats.  He continues to tolerate his treatment with Tarceva fairly well.  The patient is here today for evaluation and repeat blood work.  MEDICAL HISTORY: Past Medical History:  Diagnosis Date  . Cholelithiases 07/16/2015  . Complication of anesthesia 2007   quit breathing with bronchoscopy, had to spend night  . History of agent Orange exposure 44-45 yrs ago  . History of pneumonia  last 3-4 yrs ago   3 -4 different times  . Hypercholesterolemia   . Hypertension   . Hypothyroidism   . lung ca dx'd 2007   lung right  . Sinus disease    hx of  . Sleep apnea    uses cpap setting opf 12    ALLERGIES:  is allergic to lisinopril and prednisone.  MEDICATIONS:  Current Outpatient Medications  Medication Sig Dispense Refill  .  amitriptyline (ELAVIL) 50 MG tablet Take 50 mg by mouth at bedtime. Reported on 06/18/2015    . aspirin 81 MG tablet Take 81 mg by mouth every morning.     . Cholecalciferol 2000 units CAPS Take 2,000 Units by mouth daily.     . clindamycin (CLEOCIN-T) 1 % external solution Apply 1 application topically 2 (two) times daily as needed (for skin irritation). 240 mL 0  . diltiazem (CARDIZEM CD) 180 MG 24 hr capsule Take 1 capsule (180 mg total) by mouth daily. 90 capsule 3  . erlotinib (TARCEVA) 100 MG tablet Take 1 tablet (100 mg total) by mouth daily. Take on an empty stomach 1 hour before meals or 2 hours after.faxed to [facility] 90 tablet 1  . levETIRAcetam (KEPPRA) 750 MG tablet Take 750 mg by mouth at bedtime.     . metoprolol succinate (TOPROL-XL) 50 MG 24 hr tablet Take 1 tablet (50 mg total) by mouth daily. Take with or immediately following a meal. 90 tablet 3  . Multiple Vitamins-Minerals (CENTRUM SILVER PO) Take 1 tablet by mouth daily.     . nitroGLYCERIN (NITROSTAT) 0.4 MG SL tablet Place 1 tablet (0.4 mg total) under the tongue every 5 (five) minutes as needed for chest pain. Reported on 10/22/2015 30 tablet 11  . polyethylene glycol (MIRALAX / GLYCOLAX) packet Take 17 g by mouth daily. Reported on 08/27/2015    . psyllium (METAMUCIL) 58.6 % powder Take 1  packet by mouth daily. Reported on 08/27/2015    . rosuvastatin (CRESTOR) 5 MG tablet Take 5 mg by mouth every morning.     [name] SYNTHROID 112 MCG tablet Take 112 mcg by mouth daily.    . valsartan-hydrochlorothiazide (DIOVAN-HCT) 320-25 MG per tablet Take 1 tablet by mouth every morning.      No current facility-administered medications for this visit.     SURGICAL HISTORY:  Past Surgical History:  Procedure Laterality Date  . COLONOSCOPY WITH PROPOFOL N/A 03/19/2013   Procedure: COLONOSCOPY WITH PROPOFOL;  Surgeon: Garlan Fair, MD;  Location: [facility];  Service: Endoscopy;  Laterality: N/A;  . Cystourethroscopy, [facility].   04/16/2010  . NASAL SINUS SURGERY  1987  . Right video-assisted thoracoscopy, right upper lobectomy and  01/23/2006  . The Olympus video bronchoscope was introduced via the right  12/21/2005  . TONSILLECTOMY  1957  . Torn medial and lateral menisci, left knee.  11/06/2006    REVIEW OF SYSTEMS:  A comprehensive review of systems was negative.   PHYSICAL EXAMINATION: General appearance: alert, cooperative and no distress Head: Normocephalic, without obvious abnormality, atraumatic Neck: no adenopathy, no JVD, supple, symmetrical, trachea midline and thyroid not enlarged, symmetric, no tenderness/mass/nodules Lymph nodes: Cervical, supraclavicular, and axillary nodes normal. Resp: clear to auscultation bilaterally Back: symmetric, no curvature. ROM normal. No CVA tenderness. Cardio: regular rate and rhythm, S1, S2 normal, no murmur, click, rub or gallop GI: soft, non-tender; bowel sounds normal; no masses,  no organomegaly Extremities: extremities normal, atraumatic, no cyanosis or edema  ECOG PERFORMANCE STATUS: 0 - Asymptomatic  Blood pressure 131/90, pulse 68, temperature 97.8 F (36.6 C), temperature source Oral, resp. rate 20, height '6\' 1"'$  (1.854 m), weight 239 lb 11.2 oz (108.7 kg), SpO2 99 %.  LABORATORY DATA: Lab Results  Component Value Date   WBC 5.5 03/05/2018   HGB 14.1 03/05/2018   HCT 41.6 03/05/2018   MCV 97.2 03/05/2018   PLT 204 03/05/2018      Chemistry      Component Value Date/Time   NA 141 03/05/2018 0912   NA 139 04/06/2017 0932   K 3.6 03/05/2018 0912   K 3.8 04/06/2017 0932   CL 105 03/05/2018 0912   CL 102 04/24/2012 0853   CO2 27 03/05/2018 0912   CO2 29 04/06/2017 0932   BUN 13 03/05/2018 0912   BUN 14.4 04/06/2017 0932   CREATININE 1.00 03/05/2018 0912   CREATININE 1.0 04/06/2017 0932      Component Value Date/Time   CALCIUM 9.7 03/05/2018 0912   CALCIUM 9.5 04/06/2017 0932   ALKPHOS 58 03/05/2018 0912   ALKPHOS 58 04/06/2017 0932   AST  30 03/05/2018 0912   AST 27 04/06/2017 0932   ALT 31 03/05/2018 0912   ALT 27 04/06/2017 0932   BILITOT 0.5 03/05/2018 0912   BILITOT 0.45 04/06/2017 0932       RADIOGRAPHIC STUDIES: Ct Chest W Contrast  Result Date: 03/06/2018 CLINICAL DATA:  Right upper lobe lung adenocarcinoma status post right upper lobectomy in 2007. Recurrent right lower lobe adenocarcinoma diagnosed 2016. Ongoing oral chemotherapy. Restaging. EXAM: CT CHEST, ABDOMEN, AND PELVIS WITH CONTRAST TECHNIQUE: Multidetector CT imaging of the chest, abdomen and pelvis was performed following the standard protocol during bolus administration of intravenous contrast. CONTRAST:  19m OMNIPAQUE IOHEXOL 300 MG/ML  SOLN COMPARISON:  12/12/2017 CT chest, abdomen and pelvis. FINDINGS: CT CHEST FINDINGS Cardiovascular: Normal heart size. No significant pericardial effusion/thickening. Mildly atherosclerotic thoracic aorta  with stable ectatic 4.2 cm ascending thoracic aorta. Normal caliber pulmonary arteries. No central pulmonary emboli. Mediastinum/Nodes: Stable subcentimeter hypodense thyroid isthmus nodule. Unremarkable esophagus. No pathologically enlarged axillary, mediastinal or hilar lymph nodes. Lungs/Pleura: No pneumothorax. No pleural effusion. Status post right upper lobectomy. Lobular solid 2.4 x 1.0 cm peripheral right lower lobe pulmonary nodule (series 4/image 59), previously 2.3 x 0.9 cm on 12/12/2017 CT, and 2.0 x 0.7 cm on 01/10/2017 CT using similar measurement technique, slowly enlarging. Medial right lower lobe 1.3 x 0.7 cm nodule (series 4/image 40), previously 1.1 x 0.6 cm on 12/12/2017 CT and 1.0 x 0.5 cm on 01/10/2017 CT, slowly enlarging. Previously visualized cavitary 0.9 cm right lower lobe nodule has resolved. No acute consolidative airspace disease or new significant pulmonary nodules. Musculoskeletal:  No aggressive appearing focal osseous lesions. CT ABDOMEN PELVIS FINDINGS Hepatobiliary: Normal liver with no liver  mass. Cholelithiasis. No biliary ductal dilatation. Pancreas: Normal, with no mass or duct dilation. Spleen: Normal size. No mass. Adrenals/Urinary Tract: Normal adrenals. No hydronephrosis. Exophytic simple 1.6 cm anterior upper right renal cyst. No additional renal lesions. Normal bladder. Stomach/Bowel: Normal non-distended stomach. Normal caliber small bowel with no small bowel wall thickening. Normal appendix. Normal large bowel with no diverticulosis, large bowel wall thickening or pericolonic fat stranding. Vascular/Lymphatic: Atherosclerotic nonaneurysmal abdominal aorta. Patent portal, splenic, hepatic and renal veins. No pathologically enlarged lymph nodes in the abdomen or pelvis. Reproductive: Top-normal size prostate. Other: No pneumoperitoneum, ascites or focal fluid collection. Musculoskeletal: No aggressive appearing focal osseous lesions. Moderate lumbar spondylosis. IMPRESSION: 1. Slight growth of two right lower lobe pulmonary metastases. Previously visualized small cavitary right lower lobe pulmonary nodule has resolved. No new significant pulmonary nodules. 2. No additional sites of metastatic disease in the chest, abdomen or pelvis. 3. Stable ectatic 4.2 cm ascending thoracic aorta. Recommend annual imaging followup by CTA or MRA. This recommendation follows 2010 ACCF/AHA/AATS/ACR/ASA/SCA/SCAI/SIR/STS/SVM Guidelines for the Diagnosis and Management of Patients with Thoracic Aortic Disease. Circulation. 2010; 121: U725-D664. 4. Aortic Atherosclerosis (ICD10-I70.0) and Emphysema (ICD10-J43.9). Electronically Signed   By: Ilona Sorrel M.D.   On: 03/06/2018 12:14   Ct Abdomen Pelvis W Contrast  Result Date: 03/06/2018 CLINICAL DATA:  Right upper lobe lung adenocarcinoma status post right upper lobectomy in 2007. Recurrent right lower lobe adenocarcinoma diagnosed 2016. Ongoing oral chemotherapy. Restaging. EXAM: CT CHEST, ABDOMEN, AND PELVIS WITH CONTRAST TECHNIQUE: Multidetector CT imaging of  the chest, abdomen and pelvis was performed following the standard protocol during bolus administration of intravenous contrast. CONTRAST:  160m OMNIPAQUE IOHEXOL 300 MG/ML  SOLN COMPARISON:  12/12/2017 CT chest, abdomen and pelvis. FINDINGS: CT CHEST FINDINGS Cardiovascular: Normal heart size. No significant pericardial effusion/thickening. Mildly atherosclerotic thoracic aorta with stable ectatic 4.2 cm ascending thoracic aorta. Normal caliber pulmonary arteries. No central pulmonary emboli. Mediastinum/Nodes: Stable subcentimeter hypodense thyroid isthmus nodule. Unremarkable esophagus. No pathologically enlarged axillary, mediastinal or hilar lymph nodes. Lungs/Pleura: No pneumothorax. No pleural effusion. Status post right upper lobectomy. Lobular solid 2.4 x 1.0 cm peripheral right lower lobe pulmonary nodule (series 4/image 59), previously 2.3 x 0.9 cm on 12/12/2017 CT, and 2.0 x 0.7 cm on 01/10/2017 CT using similar measurement technique, slowly enlarging. Medial right lower lobe 1.3 x 0.7 cm nodule (series 4/image 40), previously 1.1 x 0.6 cm on 12/12/2017 CT and 1.0 x 0.5 cm on 01/10/2017 CT, slowly enlarging. Previously visualized cavitary 0.9 cm right lower lobe nodule has resolved. No acute consolidative airspace disease or new significant pulmonary nodules. Musculoskeletal:  No aggressive appearing focal osseous lesions. CT ABDOMEN PELVIS FINDINGS Hepatobiliary: Normal liver with no liver mass. Cholelithiasis. No biliary ductal dilatation. Pancreas: Normal, with no mass or duct dilation. Spleen: Normal size. No mass. Adrenals/Urinary Tract: Normal adrenals. No hydronephrosis. Exophytic simple 1.6 cm anterior upper right renal cyst. No additional renal lesions. Normal bladder. Stomach/Bowel: Normal non-distended stomach. Normal caliber small bowel with no small bowel wall thickening. Normal appendix. Normal large bowel with no diverticulosis, large bowel wall thickening or pericolonic fat stranding.  Vascular/Lymphatic: Atherosclerotic nonaneurysmal abdominal aorta. Patent portal, splenic, hepatic and renal veins. No pathologically enlarged lymph nodes in the abdomen or pelvis. Reproductive: Top-normal size prostate. Other: No pneumoperitoneum, ascites or focal fluid collection. Musculoskeletal: No aggressive appearing focal osseous lesions. Moderate lumbar spondylosis. IMPRESSION: 1. Slight growth of two right lower lobe pulmonary metastases. Previously visualized small cavitary right lower lobe pulmonary nodule has resolved. No new significant pulmonary nodules. 2. No additional sites of metastatic disease in the chest, abdomen or pelvis. 3. Stable ectatic 4.2 cm ascending thoracic aorta. Recommend annual imaging followup by CTA or MRA. This recommendation follows 2010 ACCF/AHA/AATS/ACR/ASA/SCA/SCAI/SIR/STS/SVM Guidelines for the Diagnosis and Management of Patients with Thoracic Aortic Disease. Circulation. 2010; 121: P014-D030. 4. Aortic Atherosclerosis (ICD10-I70.0) and Emphysema (ICD10-J43.9). Electronically Signed   By: Ilona Sorrel M.D.   On: 03/06/2018 12:14    ASSESSMENT AND PLAN:  This is a very pleasant 72 years old white male with recurrent non-small cell lung cancer, adenocarcinoma and positive EGFR mutation in exon 21. He is currently on Tarceva 100 mg by mouth daily status post more than 42 months of treatment. The patient is doing fine today with no concerning complaints.  He continues to tolerate his treatment well. Repeat CT scan of the chest, abdomen pelvis showed no concerning findings for disease progression except for very mild increase in some of the pulmonary nodules that need close observation on upcoming imaging studies. I personally and independently reviewed the scan images and discussed the results with the patient and his wife. I recommended for him to continue his current treatment with Tarceva with the same dose. I will see him back for follow-up visit in 2 months for  evaluation with repeat blood work. For hypertension and hypothyroidism the patient will continue with his current medication. He was advised to call immediately if he has any concerning symptoms in the interval. The patient voices understanding of current disease status and treatment options and is in agreement with the current care plan. All questions were answered. The patient knows to call the clinic with any problems, questions or concerns. We can certainly see the patient much sooner if necessary.  Disclaimer: This note was dictated with voice recognition software. Similar sounding words can inadvertently be transcribed and may not be corrected upon review.

## 2018-04-27 DIAGNOSIS — H52203 Unspecified astigmatism, bilateral: Secondary | ICD-10-CM | POA: Diagnosis not present

## 2018-04-27 DIAGNOSIS — H25013 Cortical age-related cataract, bilateral: Secondary | ICD-10-CM | POA: Diagnosis not present

## 2018-05-10 ENCOUNTER — Telehealth: Payer: Self-pay

## 2018-05-10 ENCOUNTER — Inpatient Hospital Stay (HOSPITAL_BASED_OUTPATIENT_CLINIC_OR_DEPARTMENT_OTHER): Payer: Medicare Other | Admitting: Internal Medicine

## 2018-05-10 ENCOUNTER — Encounter: Payer: Self-pay | Admitting: Internal Medicine

## 2018-05-10 ENCOUNTER — Inpatient Hospital Stay: Payer: Medicare Other | Attending: Internal Medicine

## 2018-05-10 ENCOUNTER — Encounter: Payer: Self-pay | Admitting: *Deleted

## 2018-05-10 VITALS — BP 121/79 | HR 73 | Temp 97.9°F | Resp 18 | Ht 73.0 in | Wt 240.0 lb

## 2018-05-10 DIAGNOSIS — C3411 Malignant neoplasm of upper lobe, right bronchus or lung: Secondary | ICD-10-CM | POA: Insufficient documentation

## 2018-05-10 DIAGNOSIS — Z7982 Long term (current) use of aspirin: Secondary | ICD-10-CM | POA: Diagnosis not present

## 2018-05-10 DIAGNOSIS — Z9221 Personal history of antineoplastic chemotherapy: Secondary | ICD-10-CM | POA: Insufficient documentation

## 2018-05-10 DIAGNOSIS — Z902 Acquired absence of lung [part of]: Secondary | ICD-10-CM | POA: Insufficient documentation

## 2018-05-10 DIAGNOSIS — C3431 Malignant neoplasm of lower lobe, right bronchus or lung: Secondary | ICD-10-CM

## 2018-05-10 DIAGNOSIS — Z79899 Other long term (current) drug therapy: Secondary | ICD-10-CM

## 2018-05-10 DIAGNOSIS — E039 Hypothyroidism, unspecified: Secondary | ICD-10-CM

## 2018-05-10 DIAGNOSIS — I10 Essential (primary) hypertension: Secondary | ICD-10-CM

## 2018-05-10 DIAGNOSIS — Z5111 Encounter for antineoplastic chemotherapy: Secondary | ICD-10-CM

## 2018-05-10 DIAGNOSIS — C3490 Malignant neoplasm of unspecified part of unspecified bronchus or lung: Secondary | ICD-10-CM

## 2018-05-10 LAB — CBC WITH DIFFERENTIAL (CANCER CENTER ONLY)
Abs Immature Granulocytes: 0.01 10*3/uL (ref 0.00–0.07)
Basophils Absolute: 0 10*3/uL (ref 0.0–0.1)
Basophils Relative: 1 %
Eosinophils Absolute: 0.2 10*3/uL (ref 0.0–0.5)
Eosinophils Relative: 4 %
HCT: 40.6 % (ref 39.0–52.0)
Hemoglobin: 13.9 g/dL (ref 13.0–17.0)
IMMATURE GRANULOCYTES: 0 %
LYMPHS PCT: 37 %
Lymphs Abs: 2.1 10*3/uL (ref 0.7–4.0)
MCH: 34.3 pg — ABNORMAL HIGH (ref 26.0–34.0)
MCHC: 34.2 g/dL (ref 30.0–36.0)
MCV: 100.2 fL — ABNORMAL HIGH (ref 80.0–100.0)
Monocytes Absolute: 0.6 10*3/uL (ref 0.1–1.0)
Monocytes Relative: 11 %
NEUTROS PCT: 47 %
Neutro Abs: 2.6 10*3/uL (ref 1.7–7.7)
Platelet Count: 188 10*3/uL (ref 150–400)
RBC: 4.05 MIL/uL — ABNORMAL LOW (ref 4.22–5.81)
RDW: 12.2 % (ref 11.5–15.5)
WBC Count: 5.5 10*3/uL (ref 4.0–10.5)
nRBC: 0 % (ref 0.0–0.2)

## 2018-05-10 LAB — CMP (CANCER CENTER ONLY)
ALT: 37 U/L (ref 0–44)
ANION GAP: 6 (ref 5–15)
AST: 32 U/L (ref 15–41)
Albumin: 4.1 g/dL (ref 3.5–5.0)
Alkaline Phosphatase: 52 U/L (ref 38–126)
BUN: 15 mg/dL (ref 8–23)
CO2: 30 mmol/L (ref 22–32)
Calcium: 9.2 mg/dL (ref 8.9–10.3)
Chloride: 103 mmol/L (ref 98–111)
Creatinine: 1.06 mg/dL (ref 0.61–1.24)
GFR, Est AFR Am: >60 mL/min (ref >60)
GFR, Estimated: >60 mL/min (ref >60)
Glucose, Bld: 97 mg/dL (ref 70–99)
Potassium: 3.7 mmol/L (ref 3.5–5.1)
SODIUM: 139 mmol/L (ref 135–145)
Total Bilirubin: 0.8 mg/dL (ref 0.3–1.2)
Total Protein: 6.6 g/dL (ref 6.5–8.1)

## 2018-05-10 NOTE — Progress Notes (Signed)
[place] Telephone:(336) [phone]   Fax:(336) [phone]  OFFICE PROGRESS NOTE  [name], MD 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  PRIOR THERAPY: 1) Status post right upper lobectomy under the care of Dr. Roxan Hockey on 01/23/2006.  2) Tarceva 150 mg by mouth daily as part of the BMS checkmate 370 clinical trial. Status post 6 weeks of treatment.  CURRENT THERAPY: Tarceva 100 mg by mouth daily as part of the BMS Checkmate 370 clinical trial. First dose started 07/17/2014 status post 40 months of treatment.  INTERVAL HISTORY: [name] 73 y.o. male returns to the clinic today for follow-up visit accompanied by his wife.  The patient is feeling fine today with no concerning complaints.  He denied having any shortness of breath, cough or hemoptysis but has occasional right-sided chest pain at the area of the surgical scar.  He denied having any fever or chills.  He has no nausea, vomiting, diarrhea or constipation.  He has no recent weight loss or night sweats.  He is here today for evaluation and repeat blood work.  MEDICAL HISTORY: Past Medical History:  Diagnosis Date  . Cholelithiases 07/16/2015  . Complication of anesthesia 2007   quit breathing with bronchoscopy, had to spend night  . History of agent Orange exposure 44-45 yrs ago  . History of pneumonia  last 3-4 yrs ago   3 -4 different times  . Hypercholesterolemia   . Hypertension   . Hypothyroidism   . lung ca dx'd 2007   lung right  . Sinus disease    hx of  . Sleep apnea    uses cpap setting opf 12    ALLERGIES:  is allergic to lisinopril and prednisone.  MEDICATIONS:  Current Outpatient Medications  Medication Sig Dispense Refill  . amitriptyline (ELAVIL) 50 MG tablet Take 50 mg by mouth at  bedtime. Reported on 06/18/2015    . aspirin 81 MG tablet Take 81 mg by mouth every morning.     . Cholecalciferol 2000 units CAPS Take 2,000 Units by mouth daily.     . clindamycin (CLEOCIN-T) 1 % external solution Apply 1 application topically 2 (two) times daily as needed (for skin irritation). 240 mL 0  . diltiazem (CARDIZEM CD) 180 MG 24 hr capsule Take 1 capsule (180 mg total) by mouth daily. 90 capsule 3  . erlotinib (TARCEVA) 100 MG tablet Take 1 tablet (100 mg total) by mouth daily. Take on an empty stomach 1 hour before meals or 2 hours after.faxed to [facility] 90 tablet 1  . levETIRAcetam (KEPPRA) 750 MG tablet Take 750 mg by mouth at bedtime.     . metoprolol succinate (TOPROL-XL) 50 MG 24 hr tablet Take 1 tablet (50 mg total) by mouth daily. Take with or immediately following a meal. 90 tablet 3  . Multiple Vitamins-Minerals (CENTRUM SILVER PO) Take 1 tablet by mouth daily.     . nitroGLYCERIN (NITROSTAT) 0.4 MG SL tablet Place 1 tablet (0.4 mg total) under the tongue every 5 (five) minutes as needed for chest pain. Reported on 10/22/2015 30 tablet 11  . polyethylene glycol (MIRALAX / GLYCOLAX) packet Take 17 g by mouth daily. Reported on 08/27/2015    . psyllium (METAMUCIL) 58.6 % powder Take 1 packet by mouth daily. Reported on  08/27/2015    . rosuvastatin (CRESTOR) 5 MG tablet Take 5 mg by mouth every morning.     [name] SYNTHROID 112 MCG tablet Take 112 mcg by mouth daily.    . valsartan-hydrochlorothiazide (DIOVAN-HCT) 320-25 MG per tablet Take 1 tablet by mouth every morning.      No current facility-administered medications for this visit.     SURGICAL HISTORY:  Past Surgical History:  Procedure Laterality Date  . COLONOSCOPY WITH PROPOFOL N/A 03/19/2013   Procedure: COLONOSCOPY WITH PROPOFOL;  Surgeon: Garlan Fair, MD;  Location: [facility];  Service: Endoscopy;  Laterality: N/A;  . Cystourethroscopy, [facility].  04/16/2010  . NASAL SINUS SURGERY  1987  . Right  video-assisted thoracoscopy, right upper lobectomy and  01/23/2006  . The Olympus video bronchoscope was introduced via the right  12/21/2005  . TONSILLECTOMY  1957  . Torn medial and lateral menisci, left knee.  11/06/2006    REVIEW OF SYSTEMS:  A comprehensive review of systems was negative except for: Respiratory: positive for pleurisy/chest pain   PHYSICAL EXAMINATION: General appearance: alert, cooperative and no distress Head: Normocephalic, without obvious abnormality, atraumatic Neck: no adenopathy, no JVD, supple, symmetrical, trachea midline and thyroid not enlarged, symmetric, no tenderness/mass/nodules Lymph nodes: Cervical, supraclavicular, and axillary nodes normal. Resp: clear to auscultation bilaterally Back: symmetric, no curvature. ROM normal. No CVA tenderness. Cardio: regular rate and rhythm, S1, S2 normal, no murmur, click, rub or gallop GI: soft, non-tender; bowel sounds normal; no masses,  no organomegaly Extremities: extremities normal, atraumatic, no cyanosis or edema  ECOG PERFORMANCE STATUS: 0 - Asymptomatic  Blood pressure 121/79, pulse 73, temperature 97.9 F (36.6 C), temperature source Oral, resp. rate 18, height '6\' 1"'  (1.854 m), weight 240 lb (108.9 kg), SpO2 98 %.  LABORATORY DATA: Lab Results  Component Value Date   WBC 5.5 05/10/2018   HGB 13.9 05/10/2018   HCT 40.6 05/10/2018   MCV 100.2 (H) 05/10/2018   PLT 188 05/10/2018      Chemistry      Component Value Date/Time   NA 141 03/05/2018 0912   NA 139 04/06/2017 0932   K 3.6 03/05/2018 0912   K 3.8 04/06/2017 0932   CL 105 03/05/2018 0912   CL 102 04/24/2012 0853   CO2 27 03/05/2018 0912   CO2 29 04/06/2017 0932   BUN 13 03/05/2018 0912   BUN 14.4 04/06/2017 0932   CREATININE 1.00 03/05/2018 0912   CREATININE 1.0 04/06/2017 0932      Component Value Date/Time   CALCIUM 9.7 03/05/2018 0912   CALCIUM 9.5 04/06/2017 0932   ALKPHOS 58 03/05/2018 0912   ALKPHOS 58 04/06/2017 0932    AST 30 03/05/2018 0912   AST 27 04/06/2017 0932   ALT 31 03/05/2018 0912   ALT 27 04/06/2017 0932   BILITOT 0.5 03/05/2018 0912   BILITOT 0.45 04/06/2017 0932       RADIOGRAPHIC STUDIES: No results found.  ASSESSMENT AND PLAN:  This is a very pleasant 73 years old white male with recurrent non-small cell lung cancer, adenocarcinoma and positive EGFR mutation in exon 21. He is currently on Tarceva 100 mg by mouth daily status post more than 44 months of treatment. The patient has been tolerating this treatment well with no concerning adverse effects. I recommended for him to continue his current treatment with Tarceva with the same dose. I will see him back for follow-up visit in 6 weeks for evaluation with repeat CT scan of the  chest, abdomen and pelvis for restaging of his disease. He was advised to call immediately if he has any concerning symptoms in the interval. The patient voices understanding of current disease status and treatment options and is in agreement with the current care plan. All questions were answered. The patient knows to call the clinic with any problems, questions or concerns. We can certainly see the patient much sooner if necessary.  Disclaimer: This note was dictated with voice recognition software. Similar sounding words can inadvertently be transcribed and may not be corrected upon review.

## 2018-05-10 NOTE — Research (Addendum)
05/10/2018 Research - BMS [id number] Survival Follow-up visit Y07 Met with patient and wife in clinic area today at time of routine medical oncology follow-up visit. Explained to patient that questionnaires are not needed at this follow-up time point, since he is continuing in the phase in follow-up where EQ-5D PRO questionnaires are only required every six months instead of every three months. Discussed with patient that following collection of data from today's visit, the next scheduled assessment, including PROs, is due in April 2020. This will be his final assessment on study. Thanked patient for his participation in this trial. Cindy S. Brigitte Pulse BSN, RN, [place] 05/10/2018 2:33 PM

## 2018-05-10 NOTE — Telephone Encounter (Signed)
Per 2/6 los printed avs and calender.

## 2018-05-15 ENCOUNTER — Other Ambulatory Visit: Payer: Self-pay

## 2018-05-15 ENCOUNTER — Emergency Department (HOSPITAL_COMMUNITY)
Admission: EM | Admit: 2018-05-15 | Discharge: 2018-05-15 | Disposition: A | Discharge status: Home / Self Care | Payer: Medicare Other | Attending: Emergency Medicine | Admitting: Emergency Medicine

## 2018-05-15 ENCOUNTER — Encounter (HOSPITAL_COMMUNITY): Payer: Self-pay

## 2018-05-15 DIAGNOSIS — I10 Essential (primary) hypertension: Secondary | ICD-10-CM | POA: Diagnosis not present

## 2018-05-15 DIAGNOSIS — R9431 Abnormal electrocardiogram [ECG] [EKG]: Secondary | ICD-10-CM | POA: Diagnosis not present

## 2018-05-15 DIAGNOSIS — Z85118 Personal history of other malignant neoplasm of bronchus and lung: Secondary | ICD-10-CM | POA: Diagnosis not present

## 2018-05-15 DIAGNOSIS — R0902 Hypoxemia: Secondary | ICD-10-CM | POA: Diagnosis not present

## 2018-05-15 DIAGNOSIS — Z79899 Other long term (current) drug therapy: Secondary | ICD-10-CM | POA: Insufficient documentation

## 2018-05-15 DIAGNOSIS — R1111 Vomiting without nausea: Secondary | ICD-10-CM | POA: Diagnosis not present

## 2018-05-15 DIAGNOSIS — R079 Chest pain, unspecified: Secondary | ICD-10-CM | POA: Diagnosis not present

## 2018-05-15 DIAGNOSIS — Z7982 Long term (current) use of aspirin: Secondary | ICD-10-CM | POA: Insufficient documentation

## 2018-05-15 DIAGNOSIS — K920 Hematemesis: Secondary | ICD-10-CM | POA: Insufficient documentation

## 2018-05-15 DIAGNOSIS — R0789 Other chest pain: Secondary | ICD-10-CM | POA: Insufficient documentation

## 2018-05-15 DIAGNOSIS — E039 Hypothyroidism, unspecified: Secondary | ICD-10-CM | POA: Diagnosis not present

## 2018-05-15 DIAGNOSIS — R509 Fever, unspecified: Secondary | ICD-10-CM | POA: Diagnosis not present

## 2018-05-15 DIAGNOSIS — R110 Nausea: Secondary | ICD-10-CM | POA: Diagnosis not present

## 2018-05-15 DIAGNOSIS — R112 Nausea with vomiting, unspecified: Secondary | ICD-10-CM | POA: Diagnosis not present

## 2018-05-15 LAB — I-STAT TROPONIN, ED: Troponin i, poc: 0.02 ng/mL (ref 0.00–0.08)

## 2018-05-15 LAB — POC OCCULT BLOOD, ED: Fecal Occult Bld: NEGATIVE

## 2018-05-15 LAB — CBC WITH DIFFERENTIAL/PLATELET
Abs Immature Granulocytes: 0.01 10*3/uL (ref 0.00–0.07)
Basophils Absolute: 0 10*3/uL (ref 0.0–0.1)
Basophils Relative: 0 %
Eosinophils Absolute: 0.1 10*3/uL (ref 0.0–0.5)
Eosinophils Relative: 2 %
HCT: 43 % (ref 39.0–52.0)
Hemoglobin: 14.4 g/dL (ref 13.0–17.0)
Immature Granulocytes: 0 %
Lymphocytes Relative: 5 %
Lymphs Abs: 0.3 10*3/uL — ABNORMAL LOW (ref 0.7–4.0)
MCH: 33.6 pg (ref 26.0–34.0)
MCHC: 33.5 g/dL (ref 30.0–36.0)
MCV: 100.5 fL — ABNORMAL HIGH (ref 80.0–100.0)
Monocytes Absolute: 0.2 10*3/uL (ref 0.1–1.0)
Monocytes Relative: 4 %
Neutro Abs: 5.3 10*3/uL (ref 1.7–7.7)
Neutrophils Relative %: 89 %
Platelets: 165 10*3/uL (ref 150–400)
RBC: 4.28 MIL/uL (ref 4.22–5.81)
RDW: 12.2 % (ref 11.5–15.5)
WBC: 5.9 10*3/uL (ref 4.0–10.5)
nRBC: 0 % (ref 0.0–0.2)

## 2018-05-15 LAB — COMPREHENSIVE METABOLIC PANEL
ALT: 35 U/L (ref 0–44)
AST: 41 U/L (ref 15–41)
Albumin: 4.3 g/dL (ref 3.5–5.0)
Alkaline Phosphatase: 37 U/L — ABNORMAL LOW (ref 38–126)
Anion gap: 13 (ref 5–15)
BUN: 15 mg/dL (ref 8–23)
CO2: 26 mmol/L (ref 22–32)
Calcium: 9.5 mg/dL (ref 8.9–10.3)
Chloride: 100 mmol/L (ref 98–111)
Creatinine, Ser: 1.13 mg/dL (ref 0.61–1.24)
GFR calc Af Amer: >60 mL/min (ref >60)
GFR calc non Af Amer: >60 mL/min (ref >60)
Glucose, Bld: 123 mg/dL — ABNORMAL HIGH (ref 70–99)
Potassium: 3.8 mmol/L (ref 3.5–5.1)
Sodium: 139 mmol/L (ref 135–145)
Total Bilirubin: 0.9 mg/dL (ref 0.3–1.2)
Total Protein: 6.6 g/dL (ref 6.5–8.1)

## 2018-05-15 LAB — TYPE AND SCREEN
ABO/RH(D): O POS
Antibody Screen: NEGATIVE

## 2018-05-15 LAB — CBC
HCT: 46.4 % (ref 39.0–52.0)
Hemoglobin: 15.9 g/dL (ref 13.0–17.0)
MCH: 34.6 pg — ABNORMAL HIGH (ref 26.0–34.0)
MCHC: 34.3 g/dL (ref 30.0–36.0)
MCV: 101.1 fL — ABNORMAL HIGH (ref 80.0–100.0)
Platelets: 191 10*3/uL (ref 150–400)
RBC: 4.59 MIL/uL (ref 4.22–5.81)
RDW: 12.3 % (ref 11.5–15.5)
WBC: 7.1 10*3/uL (ref 4.0–10.5)
nRBC: 0 % (ref 0.0–0.2)

## 2018-05-15 MED ORDER — FAMOTIDINE 20 MG PO TABS: 20.0000 mg | ORAL_TABLET | Freq: Every day | ORAL | 0 refills | Status: DC

## 2018-05-15 NOTE — Discharge Instructions (Addendum)
Stop aspirin until you follow-up with gastroenterology. Start taking pepcid until then as well. Take pepcid 8-10 hours after your take tarceva (if you take tarceva in the morning then take

## 2018-05-15 NOTE — ED Triage Notes (Signed)
Pt reports multiple bright red bloody emesis episodes since 3am this morning. Pt also having lower chest discomfort. Pt alert.

## 2018-05-15 NOTE — ED Provider Notes (Signed)
[facility] Provider Note   CSN: [id number] Arrival date & time: 05/15/18  1303     History   Chief Complaint Chief Complaint  Patient presents with  . Chest Pain  . GI Bleeding  . Emesis    HPI [name] is a 73 y.o. male.  HPI   38yM with hematemesis. Two episodes. Once very early this morning and again at around 0600. Felt very nauseated and hot. Describes bright red blood with "black chunks." Felt dizzy later on in the morning. Called PCP and recommended to come to ED. Currently feeling better. Denies any acute pain then or now. On 81 mg ASA. No BRBPR or melena. Denies hx of PUD, varices or known liver dysfunction. Being treated for lung CA. He is very sure this was not hemoptysis.   Past Medical History:  Diagnosis Date  . Cholelithiases 07/16/2015  . Complication of anesthesia 2007   quit breathing with bronchoscopy, had to spend night  . History of agent Orange exposure 44-45 yrs ago  . History of pneumonia  last 3-4 yrs ago   3 -4 different times  . Hypercholesterolemia   . Hypertension   . Hypothyroidism   . lung ca dx'd 2007   lung right  . Sinus disease    hx of  . Sleep apnea    uses cpap setting opf 12    Patient Active Problem List   Diagnosis Date Noted  . Angina pectoris ([place]) 01/01/2018  . Acquired hypothyroidism 01/12/2017  . HTN (hypertension) 09/27/2016  . Hypothyroidism 03/10/2016  . Cholelithiases 07/16/2015  . Other long term (current) drug therapy 02/12/2015  . Rash 12/14/2014  . Encounter for antineoplastic chemotherapy 10/09/2014  . Cough with hemoptysis   . Malignant neoplasm of lower lobe of right lung ([place])   . Sinus disease   . Sleep apnea   . History of pneumonia   . History of degenerative joint disease     Past Surgical History:  Procedure Laterality Date  . COLONOSCOPY WITH PROPOFOL N/A 03/19/2013   Procedure: COLONOSCOPY WITH PROPOFOL;  Surgeon: Garlan Fair, MD;   Location: [facility];  Service: Endoscopy;  Laterality: N/A;  . Cystourethroscopy, [place] TURP.  04/16/2010  . NASAL SINUS SURGERY  1987  . Right video-assisted thoracoscopy, right upper lobectomy and  01/23/2006  . The Olympus video bronchoscope was introduced via the right  12/21/2005  . TONSILLECTOMY  1957  . Torn medial and lateral menisci, left knee.  11/06/2006        Home Medications    Prior to Admission medications   Medication Sig Start Date End Date Taking? Authorizing Provider  amitriptyline (ELAVIL) 50 MG tablet Take 50 mg by mouth at bedtime. Reported on 06/18/2015 04/04/1993  Yes [provider]  aspirin 81 MG tablet Take 81 mg by mouth every morning.  10/03/1983  Yes [provider]  Cholecalciferol 2000 units CAPS Take 2,000 Units by mouth daily.  08/13/14  Yes [name], MD  clindamycin (CLEOCIN-T) 1 % external solution Apply 1 application topically 2 (two) times daily as needed (for skin irritation). 05/30/16  Yes Curt Bears, MD  diltiazem (CARDIZEM CD) 180 MG 24 hr capsule Take 1 capsule (180 mg total) by mouth daily. 01/02/18  Yes Belva Crome, MD  erlotinib (TARCEVA) 100 MG tablet Take 1 tablet (100 mg total) by mouth daily. Take on an empty stomach 1 hour before meals or 2 hours after.faxed to [facility] 10/12/17  Yes Maryanna Shape, NP  levETIRAcetam (KEPPRA) 750 MG tablet Take 750 mg by mouth at bedtime.  08/12/14  Yes [provider]  metoprolol succinate (TOPROL-XL) 50 MG 24 hr tablet Take 1 tablet (50 mg total) by mouth daily. Take with or immediately following a meal. 01/02/18  Yes Belva Crome, MD  Multiple Vitamins-Minerals (CENTRUM SILVER PO) Take 1 tablet by mouth daily.  04/05/03  Yes [provider]  nitroGLYCERIN (NITROSTAT) 0.4 MG SL tablet Place 1 tablet (0.4 mg total) under the tongue every 5 (five) minutes as needed for chest pain. Reported on 10/22/2015 01/04/18  Yes Belva Crome, MD  polyethylene glycol  [facility] / Floria Raveling) packet Take 17 g by mouth daily. Reported on 08/27/2015   Yes [provider]  psyllium (METAMUCIL) 58.6 % powder Take 1 packet by mouth daily. Reported on 08/27/2015   Yes [provider]  rosuvastatin (CRESTOR) 5 MG tablet Take 5 mg by mouth every morning.  05/05/10  Yes [provider]  SYNTHROID 112 MCG tablet Take 112 mcg by mouth daily. 03/07/16  Yes [provider]  valsartan-hydrochlorothiazide (DIOVAN-HCT) 320-25 MG per tablet Take 1 tablet by mouth every morning.  05/05/10  Yes [provider]    Family History Family History  Problem Relation Age of Onset  . Hypertension Mother   . Heart attack Father   . Heart attack Brother     Social History Social History   Tobacco Use  . Smoking status: Never Smoker  . Smokeless tobacco: Never Used  Substance Use Topics  . Alcohol use: No    Alcohol/week: 0.0 standard drinks  . Drug use: No     Allergies   Lisinopril and Prednisone   Review of Systems Review of Systems All systems reviewed and negative, other than as noted in HPI.   Physical Exam Updated Vital Signs BP 121/68   Pulse 80   Temp 97.9 F (36.6 C)   Resp (!) 23   Ht 6' (1.829 m)   Wt 108.9 kg   SpO2 96%   BMI 32.55 kg/m   Physical Exam Vitals signs and nursing note reviewed.  Constitutional:      General: He is not in acute distress.    Appearance: He is well-developed.  HENT:     Head: Normocephalic and atraumatic.  Eyes:     General:        Right eye: No discharge.        Left eye: No discharge.     Conjunctiva/sclera: Conjunctivae normal.  Neck:     Musculoskeletal: Neck supple.  Cardiovascular:     Rate and Rhythm: Normal rate and regular rhythm.     Heart sounds: Normal heart sounds. No murmur. No friction rub. No gallop.   Pulmonary:     Effort: Pulmonary effort is normal. No respiratory distress.     Breath sounds: Normal breath sounds.  Abdominal:     General: There is  no distension.     Palpations: Abdomen is soft.     Tenderness: There is no abdominal tenderness.  Musculoskeletal:        General: No tenderness.  Skin:    General: Skin is warm and dry.  Neurological:     Mental Status: He is alert.  Psychiatric:        Behavior: Behavior normal.        Thought Content: Thought content normal.      ED Treatments / Results  Labs (  all labs ordered are listed, but only abnormal results are displayed) Labs Reviewed  COMPREHENSIVE METABOLIC PANEL - Abnormal; Notable for the following components:      Result Value   Glucose, Bld 123 (*)    Alkaline Phosphatase 37 (*)    All other components within normal limits  CBC - Abnormal; Notable for the following components:   MCV 101.1 (*)    MCH 34.6 (*)    All other components within normal limits  CBC WITH DIFFERENTIAL/PLATELET - Abnormal; Notable for the following components:   MCV 100.5 (*)    Lymphs Abs 0.3 (*)    All other components within normal limits  OCCULT BLOOD X 1 CARD TO LAB, STOOL  POC OCCULT BLOOD, ED  I-STAT TROPONIN, ED  TYPE AND SCREEN    EKG EKG Interpretation  Date/Time:  Tuesday May 15 2018 13:07:31 EST Ventricular Rate:  110 PR Interval:    QRS Duration: 94 QT Interval:  480 QTC Calculation: 649 R Axis:   61 Text Interpretation:  sinus rhytm Nonspecific ST abnormality Prolonged QT Abnormal ECG Suspect electronic measurement of QT is actually  including p wave Confirmed by Virgel Manifold [phone]) on 05/15/2018 4:54:23 PM   Radiology No results found.  Procedures Procedures (including critical care time)  Medications Ordered in ED Medications - No data to display   Initial Impression / Assessment and Plan / ED Course  I have reviewed the triage vital signs and the nursing notes.  Pertinent labs & imaging results that were available during my care of the patient were reviewed by me and considered in my medical decision making (see chart for details).      72yM with hematemesis. This is generally concerning symptom but otherwise w/u pretty reassuring.. When I evaluated him it was about 12 hours after the last time he vomited. He has no acute complaints currently. BP noted but he says he normally runs 099-833 systolic. Platelets fine. No known varices or liver dysfunction. H/H normal. BUN normal. Only on 81 mg ASA.   Discussed admission for observation for monitoring, trending H/H and consideration of possible upper endoscopy. He is on Tarceva and apparently some cases of GI perforation associated with its use. He is very hesitant to be admitted though. Shared decision making. He certainly doesn't exam like a perforation nor have continued symptoms to suggest this.  If discharged, I explained that I would at least like to repeat H/H.  It has been a few hours since the initial was drawn. If this remains stable and he remains asymptomatic then will need to FU very closely with GI. HAs seen [facility] previously. Would have him hold ASA for the time being. Would place on PPI and have very low threshold for returning to the ED.   Hemoglobin 14. Hemoccult negative. No further complaints. Abdominal exam remains benign. Interestingly, he has a fever of 100.5. Viral GI illness? No cough. No dyspnea. No urinary complaints. Advised to monitor. Apparently not supposed to use PPI with tarceva. Placed on pepcid. Reiterated return precautions.   Final Clinical Impressions(s) / ED Diagnoses   Final diagnoses:  Hematemesis with nausea    ED Discharge Orders    None       Virgel Manifold, MD 05/16/18 1314

## 2018-05-16 ENCOUNTER — Telehealth: Payer: Self-pay | Admitting: Medical Oncology

## 2018-05-16 NOTE — Telephone Encounter (Signed)
ED yesterday  for vomiting blood. Prescribed Pepcid to take hs at pt takes tarceva in am. . Is this ok with [name]?  Per [name] continue Tarceva and take Pepcid in pm.

## 2018-05-18 ENCOUNTER — Telehealth: Payer: Self-pay | Admitting: Medical Oncology

## 2018-05-18 ENCOUNTER — Other Ambulatory Visit: Payer: Self-pay | Admitting: Gastroenterology

## 2018-05-18 DIAGNOSIS — K920 Hematemesis: Secondary | ICD-10-CM | POA: Diagnosis not present

## 2018-05-18 DIAGNOSIS — G4730 Sleep apnea, unspecified: Secondary | ICD-10-CM | POA: Diagnosis not present

## 2018-05-18 DIAGNOSIS — K219 Gastro-esophageal reflux disease without esophagitis: Secondary | ICD-10-CM | POA: Diagnosis not present

## 2018-05-18 DIAGNOSIS — Z86010 Personal history of colonic polyps: Secondary | ICD-10-CM | POA: Diagnosis not present

## 2018-05-18 DIAGNOSIS — C3490 Malignant neoplasm of unspecified part of unspecified bronchus or lung: Secondary | ICD-10-CM | POA: Diagnosis not present

## 2018-05-18 NOTE — Telephone Encounter (Signed)
Medication change/interaction- Dr. Oletta Lamas want to stop the pepcid and prescribed dexilant. Is it okay for pt to  take this with Tarceva and does it need to be 12 hours apart? He has endoscopy on Monday.

## 2018-05-18 NOTE — Telephone Encounter (Signed)
There is a category X interaction between Lyman and Richards. Proton pump inhibitors may decrease his serum concentration of Tarceva, manufacturer recommends to avoid concurrent administration of Tarceva with any PPI when possible.  Due to the prolonged effect of PPI's on gastric pH is unlikely to be adequate means of avoiding an interaction. Average Tarceva maximum serum concentration and AUC decreased 61% and 46%, respectively, when Tarceva is coadministered with a PPI.  Efficacy information should be discussed with MD for chioce of acid suppressing agent.  Johny Drilling, PharmD, BCPS, BCOP

## 2018-05-18 NOTE — Telephone Encounter (Addendum)
I left message for pt that I did not hear back from [place]. In the past he took the pepcid 12 hours from [place]. I gave him the information from [place]. Pt said Dr Oletta Lamas called pharmacist about interactions with Dexilant and he told pt not to take it. Pt is not taking dexilant.

## 2018-05-19 NOTE — Anesthesia Preprocedure Evaluation (Signed)
Anesthesia Evaluation  Patient identified by MRN, date of birth, ID band Patient awake    Reviewed: Allergy & Precautions, H&P , NPO status , Patient's Chart, lab work & pertinent test results  History of Anesthesia Complications (+) history of anesthetic complications  Airway Mallampati: II  TM Distance: >3 FB Neck ROM: Full    Dental no notable dental hx.    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 Ongoing oral chemotherapy. Restaging.     Pulmonary exam normal breath sounds clear to auscultation       Cardiovascular hypertension, Pt. on medications and Pt. on home beta blockers Normal cardiovascular exam Rhythm:Regular Rate:Normal     Neuro/Psych negative neurological ROS  negative psych ROS   GI/Hepatic negative GI ROS, Neg liver ROS,   Endo/Other  Hypothyroidism   Renal/GU negative Renal ROS  negative genitourinary   Musculoskeletal  (+) Arthritis , Osteoarthritis,    Abdominal (+) + obese,   Peds negative pediatric ROS (+)  Hematology negative hematology ROS (+)   Anesthesia Other Findings  Complication of anesthesia 2007 quit breathing with bronchoscopy, had to spend night   Reproductive/Obstetrics negative OB ROS                             Anesthesia Physical  Anesthesia Plan  ASA: III  Anesthesia Plan: MAC   Post-op Pain Management:    Induction: Intravenous  PONV Risk Score and Plan:   Airway Management Planned:   Additional Equipment:   Intra-op Plan:   Post-operative Plan:   Informed Consent: I have reviewed the patients History and Physical, chart, labs and discussed the procedure including the risks, benefits and alternatives for the  proposed anesthesia with the patient or authorized representative who has indicated his/her understanding and acceptance.     Dental advisory given  Plan Discussed with: CRNA, Anesthesiologist and Surgeon  Anesthesia Plan Comments:         Anesthesia Quick Evaluation

## 2018-05-21 ENCOUNTER — Ambulatory Visit (HOSPITAL_COMMUNITY)
Admission: RE | Admit: 2018-05-21 | Discharge: 2018-05-21 | Disposition: A | Discharge status: Home / Self Care | Payer: Medicare Other | Attending: Gastroenterology | Admitting: Gastroenterology

## 2018-05-21 ENCOUNTER — Ambulatory Visit (HOSPITAL_COMMUNITY): Payer: Medicare Other | Admitting: Anesthesiology

## 2018-05-21 ENCOUNTER — Other Ambulatory Visit: Payer: Self-pay

## 2018-05-21 ENCOUNTER — Encounter (HOSPITAL_COMMUNITY): Payer: Self-pay | Admitting: *Deleted

## 2018-05-21 ENCOUNTER — Encounter (HOSPITAL_COMMUNITY)
Admission: RE | Disposition: A | Discharge status: Home / Self Care | Payer: Self-pay | Source: Home / Self Care | Attending: Gastroenterology

## 2018-05-21 DIAGNOSIS — K2970 Gastritis, unspecified, without bleeding: Secondary | ICD-10-CM | POA: Diagnosis not present

## 2018-05-21 DIAGNOSIS — K2951 Unspecified chronic gastritis with bleeding: Secondary | ICD-10-CM | POA: Diagnosis not present

## 2018-05-21 DIAGNOSIS — Z79899 Other long term (current) drug therapy: Secondary | ICD-10-CM | POA: Insufficient documentation

## 2018-05-21 DIAGNOSIS — B9681 Helicobacter pylori [H. pylori] as the cause of diseases classified elsewhere: Secondary | ICD-10-CM | POA: Diagnosis not present

## 2018-05-21 DIAGNOSIS — Z85118 Personal history of other malignant neoplasm of bronchus and lung: Secondary | ICD-10-CM | POA: Insufficient documentation

## 2018-05-21 DIAGNOSIS — Z79890 Hormone replacement therapy (postmenopausal): Secondary | ICD-10-CM | POA: Insufficient documentation

## 2018-05-21 DIAGNOSIS — Z7982 Long term (current) use of aspirin: Secondary | ICD-10-CM | POA: Insufficient documentation

## 2018-05-21 DIAGNOSIS — K3189 Other diseases of stomach and duodenum: Secondary | ICD-10-CM | POA: Diagnosis not present

## 2018-05-21 DIAGNOSIS — I10 Essential (primary) hypertension: Secondary | ICD-10-CM | POA: Diagnosis not present

## 2018-05-21 DIAGNOSIS — K920 Hematemesis: Secondary | ICD-10-CM | POA: Insufficient documentation

## 2018-05-21 DIAGNOSIS — K922 Gastrointestinal hemorrhage, unspecified: Secondary | ICD-10-CM | POA: Diagnosis not present

## 2018-05-21 HISTORY — PX: ESOPHAGOGASTRODUODENOSCOPY (EGD) WITH PROPOFOL: SHX5813

## 2018-05-21 HISTORY — PX: BIOPSY: SHX5522

## 2018-05-21 SURGERY — ESOPHAGOGASTRODUODENOSCOPY (EGD) WITH PROPOFOL
Anesthesia: Monitor Anesthesia Care

## 2018-05-21 MED ORDER — LIDOCAINE 2% (20 MG/ML) 5 ML SYRINGE
INTRAMUSCULAR | Status: DC | PRN
  Administered 2018-05-21: 100 mg via INTRAVENOUS

## 2018-05-21 MED ORDER — PROPOFOL 500 MG/50ML IV EMUL
INTRAVENOUS | Status: DC | PRN
  Administered 2018-05-21: 100 ug/kg/min via INTRAVENOUS

## 2018-05-21 MED ORDER — PROPOFOL 10 MG/ML IV BOLUS
INTRAVENOUS | Status: AC
  Filled 2018-05-21: qty 60

## 2018-05-21 MED ORDER — PROPOFOL 10 MG/ML IV BOLUS
INTRAVENOUS | Status: DC | PRN
  Administered 2018-05-21: 30 mg via INTRAVENOUS
  Administered 2018-05-21: 10 mg via INTRAVENOUS
  Administered 2018-05-21: 20 mg via INTRAVENOUS
  Administered 2018-05-21: 10 mg via INTRAVENOUS
  Administered 2018-05-21: 20 mg via INTRAVENOUS

## 2018-05-21 MED ORDER — LACTATED RINGERS IV SOLN
INTRAVENOUS | Status: DC
  Administered 2018-05-21: 1000 mL via INTRAVENOUS

## 2018-05-21 SURGICAL SUPPLY — 15 items

## 2018-05-21 NOTE — Anesthesia Postprocedure Evaluation (Signed)
Anesthesia Post Note  Patient: [name]  Procedure(s) Performed: ESOPHAGOGASTRODUODENOSCOPY (EGD) WITH PROPOFOL (N/A ) BIOPSY     Patient location during evaluation: PACU Anesthesia Type: MAC Level of consciousness: awake and alert Pain management: pain level controlled Vital Signs Assessment: post-procedure vital signs reviewed and stable Respiratory status: spontaneous breathing, nonlabored ventilation, respiratory function stable and patient connected to nasal cannula oxygen Cardiovascular status: stable and blood pressure returned to baseline Postop Assessment: no apparent nausea or vomiting Anesthetic complications: no    Last Vitals:  Vitals:   05/21/18 0845 05/21/18 0850  BP:  (!) 163/90  Pulse:  (!) 58  Resp:  20  Temp:    SpO2: 94% 97%    Last Pain:  Vitals:   05/21/18 0850  TempSrc:   PainSc: 0-No pain                 Nichoel Digiulio

## 2018-05-21 NOTE — Discharge Instructions (Signed)

## 2018-05-21 NOTE — H&P (Signed)
73 year old male here today for EGD to evaluate hematemesis that occurred 6 days ago. He feels fine. He has a hx of lung cancer. Prior history reviewed.   PE: No distress Heart RRR Lungs clear Abdomen soft and non tender.  IMP: Hematemesis Plan. EGD.

## 2018-05-21 NOTE — Transfer of Care (Signed)
Immediate Anesthesia Transfer of Care Note  Patient: [name]  Procedure(s) Performed: ESOPHAGOGASTRODUODENOSCOPY (EGD) WITH PROPOFOL (N/A ) BIOPSY  Patient Location: Endoscopy Unit  Anesthesia Type:MAC  Level of Consciousness: drowsy and patient cooperative  Airway & Oxygen Therapy: Patient connected to nasal cannula oxygen  Post-op Assessment: Report given to RN and Post -op Vital signs reviewed and stable  Post vital signs: Reviewed and stable  Last Vitals:  Vitals Value Taken Time  BP    Temp    Pulse    Resp    SpO2      Last Pain:  Vitals:   05/21/18 0700  TempSrc: Oral  PainSc: 0-No pain         Complications: No apparent anesthesia complications

## 2018-05-21 NOTE — Op Note (Signed)
[facility] Patient Name: [name] Procedure Date: 05/21/2018 MRN: [id number] Attending MD: Wonda Horner , MD Date of Birth: 1946-02-06 CSN: [id number] Age: 73 Admit Type: Outpatient Procedure:                Upper GI endoscopy Indications:              Hematemesis Providers:                Wonda Horner, MD, Raynelle Bring, RN, William Dalton, Technician Referring MD:              Medicines:                Propofol per Anesthesia Complications:            No immediate complications. Estimated Blood Loss:     Estimated blood loss: none. Procedure:                Pre-Anesthesia Assessment:                           - Prior to the procedure, a History and Physical                            was performed, and patient medications and                            allergies were reviewed. The patient's tolerance of                            previous anesthesia was also reviewed. The risks                            and benefits of the procedure and the sedation                            options and risks were discussed with the patient.                            All questions were answered, and informed consent                            was obtained. Prior Anticoagulants: The patient has                            taken no previous anticoagulant or antiplatelet                            agents. ASA Grade Assessment: III - A patient with                            severe systemic disease. After reviewing the risks  and benefits, the patient was deemed in                            satisfactory condition to undergo the procedure.                           After obtaining informed consent, the endoscope was                            passed under direct vision. Throughout the                            procedure, the patient's blood pressure, pulse, and                            oxygen saturations were monitored  continuously. The                            GIF-H190 ([id number]) Olympus gastroscope was                            introduced through the mouth, and advanced to the                            second part of duodenum. The upper GI endoscopy was                            accomplished without difficulty. The patient                            tolerated the procedure well. Scope In: Scope Out: Findings:      The examined esophagus was normal.      Diffuse moderately erythematous mucosa without bleeding was found in the       entire examined stomach. No ulcers. Biopsies were taken with a cold       forceps for Helicobacter pylori testing.      The examined duodenum was normal. Impression:               - Normal esophagus.                           - Erythematous mucosa in the stomach. Biopsied.                           - Normal examined duodenum. Moderate Sedation:      . Recommendation:           - Await pathology results.                           - Return to referring physician as previously                            scheduled. Procedure Code(s):        --- Professional ---  02585, Esophagogastroduodenoscopy, flexible,                            transoral; with biopsy, single or multiple Diagnosis Code(s):        --- Professional ---                           K31.89, Other diseases of stomach and duodenum                           K92.0, Hematemesis CPT copyright 2018 American Medical Association. All rights reserved. The codes documented in this report are preliminary and upon coder review may  be revised to meet current compliance requirements. Wonda Horner, MD 05/21/2018 8:26:42 AM This report has been signed electronically. Number of Addenda: 0

## 2018-05-24 ENCOUNTER — Encounter (HOSPITAL_COMMUNITY): Payer: Self-pay | Admitting: Gastroenterology

## 2018-05-24 ENCOUNTER — Telehealth: Payer: Self-pay | Admitting: *Deleted

## 2018-05-24 NOTE — Telephone Encounter (Signed)
Received TC from Dr. Laurence Spates, GI physician for patient. He is asking for a call back from Dr. [name] regarding a medication he would like to start patient on. He is aware that pt is on Tarceva and that there may be drug interactions.  He did not leave the name of the medication. He is requesting a call back  @ [phone]. Dr. Oletta Lamas is available this afternoon and Friday morning for a call from Dr. [name].

## 2018-06-06 ENCOUNTER — Telehealth: Payer: Self-pay | Admitting: *Deleted

## 2018-06-06 ENCOUNTER — Encounter: Payer: Self-pay | Admitting: *Deleted

## 2018-06-06 DIAGNOSIS — C3431 Malignant neoplasm of lower lobe, right bronchus or lung: Secondary | ICD-10-CM

## 2018-06-06 NOTE — Telephone Encounter (Signed)
06/06/2018 Received return phone call from patient this afternoon. See Research Encounter for details. Cindy S. Brigitte Pulse BSN, RN, [facility] 06/06/2018 3:36 PM

## 2018-06-06 NOTE — Telephone Encounter (Signed)
06/06/2018 Left voice mail message for patient requesting that he contact me at my direct number, [phone], this week for research study follow-up. Cindy S. Brigitte Pulse BSN, RN, CCRP 06/06/2018 10:18 AM

## 2018-06-06 NOTE — Research (Signed)
06/06/2018 Research - BMS [id number] Survival Follow-up visit Y08 - End of study follow-up Spoke with patient by phone today for completion of research study Survival Follow-up visit Y08. Explained to patient that he is being contacted by phone for this visit, as correspondence with the study team has concluded that the final study visit should occur today, at 4 years from start of study treatment, per protocol, rather than next month, or Month 24, as originally scheduled.. The EQ-5D patient questionnaire was administered to the patient by phone, by reading all instructions, questions and possible responses and recording the patient's responses on the Health Questionnaire. Patient is aware that this visit concludes his study follow-up period. Thanked patient for his participation in the study.  Cindy S. Brigitte Pulse BSN, RN, [facility] 06/06/2018 3:43 PM

## 2018-06-13 ENCOUNTER — Telehealth: Payer: Self-pay | Admitting: Internal Medicine

## 2018-06-13 NOTE — Telephone Encounter (Signed)
Patient called to reschedule  °

## 2018-06-18 ENCOUNTER — Inpatient Hospital Stay: Payer: Medicare Other | Attending: Internal Medicine

## 2018-06-18 ENCOUNTER — Other Ambulatory Visit: Payer: Self-pay

## 2018-06-18 ENCOUNTER — Other Ambulatory Visit: Payer: Medicare Other

## 2018-06-18 DIAGNOSIS — C3431 Malignant neoplasm of lower lobe, right bronchus or lung: Secondary | ICD-10-CM | POA: Insufficient documentation

## 2018-06-18 DIAGNOSIS — Z902 Acquired absence of lung [part of]: Secondary | ICD-10-CM | POA: Diagnosis not present

## 2018-06-18 DIAGNOSIS — Z79899 Other long term (current) drug therapy: Secondary | ICD-10-CM | POA: Diagnosis not present

## 2018-06-18 DIAGNOSIS — Z20828 Contact with and (suspected) exposure to other viral communicable diseases: Secondary | ICD-10-CM | POA: Insufficient documentation

## 2018-06-18 DIAGNOSIS — I10 Essential (primary) hypertension: Secondary | ICD-10-CM | POA: Diagnosis not present

## 2018-06-18 DIAGNOSIS — E039 Hypothyroidism, unspecified: Secondary | ICD-10-CM | POA: Diagnosis not present

## 2018-06-18 DIAGNOSIS — R05 Cough: Secondary | ICD-10-CM | POA: Insufficient documentation

## 2018-06-18 DIAGNOSIS — G4730 Sleep apnea, unspecified: Secondary | ICD-10-CM | POA: Diagnosis not present

## 2018-06-18 DIAGNOSIS — E7800 Pure hypercholesterolemia, unspecified: Secondary | ICD-10-CM | POA: Diagnosis not present

## 2018-06-18 DIAGNOSIS — J22 Unspecified acute lower respiratory infection: Secondary | ICD-10-CM | POA: Diagnosis not present

## 2018-06-18 DIAGNOSIS — R0602 Shortness of breath: Secondary | ICD-10-CM | POA: Insufficient documentation

## 2018-06-18 DIAGNOSIS — Z7982 Long term (current) use of aspirin: Secondary | ICD-10-CM | POA: Insufficient documentation

## 2018-06-18 DIAGNOSIS — C3490 Malignant neoplasm of unspecified part of unspecified bronchus or lung: Secondary | ICD-10-CM

## 2018-06-18 LAB — CMP (CANCER CENTER ONLY)
ALT: 37 U/L (ref 0–44)
AST: 31 U/L (ref 15–41)
Albumin: 3.6 g/dL (ref 3.5–5.0)
Alkaline Phosphatase: 60 U/L (ref 38–126)
Anion gap: 9 (ref 5–15)
BUN: 13 mg/dL (ref 8–23)
CO2: 28 mmol/L (ref 22–32)
Calcium: 9.3 mg/dL (ref 8.9–10.3)
Chloride: 99 mmol/L (ref 98–111)
Creatinine: 0.97 mg/dL (ref 0.61–1.24)
GFR, Est AFR Am: >60 mL/min (ref >60)
GFR, Estimated: >60 mL/min (ref >60)
Glucose, Bld: 101 mg/dL — ABNORMAL HIGH (ref 70–99)
Potassium: 3.6 mmol/L (ref 3.5–5.1)
Sodium: 136 mmol/L (ref 135–145)
TOTAL PROTEIN: 6.7 g/dL (ref 6.5–8.1)
Total Bilirubin: 0.8 mg/dL (ref 0.3–1.2)

## 2018-06-18 LAB — CBC WITH DIFFERENTIAL (CANCER CENTER ONLY)
Abs Immature Granulocytes: 0.02 10*3/uL (ref 0.00–0.07)
Basophils Absolute: 0 10*3/uL (ref 0.0–0.1)
Basophils Relative: 1 %
Eosinophils Absolute: 0.3 10*3/uL (ref 0.0–0.5)
Eosinophils Relative: 4 %
HCT: 39.4 % (ref 39.0–52.0)
Hemoglobin: 13.3 g/dL (ref 13.0–17.0)
Immature Granulocytes: 0 %
Lymphocytes Relative: 26 %
Lymphs Abs: 2.2 10*3/uL (ref 0.7–4.0)
MCH: 34.2 pg — AB (ref 26.0–34.0)
MCHC: 33.8 g/dL (ref 30.0–36.0)
MCV: 101.3 fL — ABNORMAL HIGH (ref 80.0–100.0)
Monocytes Absolute: 1 10*3/uL (ref 0.1–1.0)
Monocytes Relative: 11 %
Neutro Abs: 4.9 10*3/uL (ref 1.7–7.7)
Neutrophils Relative %: 58 %
Platelet Count: 193 10*3/uL (ref 150–400)
RBC: 3.89 MIL/uL — AB (ref 4.22–5.81)
RDW: 12.3 % (ref 11.5–15.5)
WBC: 8.4 10*3/uL (ref 4.0–10.5)
nRBC: 0 % (ref 0.0–0.2)

## 2018-06-19 ENCOUNTER — Ambulatory Visit (HOSPITAL_COMMUNITY)
Admission: RE | Admit: 2018-06-19 | Discharge: 2018-06-19 | Disposition: A | Discharge status: Home / Self Care | Payer: Medicare Other | Source: Ambulatory Visit | Attending: Internal Medicine | Admitting: Internal Medicine

## 2018-06-19 DIAGNOSIS — C3490 Malignant neoplasm of unspecified part of unspecified bronchus or lung: Secondary | ICD-10-CM | POA: Insufficient documentation

## 2018-06-19 MED ORDER — IOHEXOL 300 MG/ML  SOLN
100.0000 mL | Freq: Once | INTRAMUSCULAR | Status: AC | PRN
  Administered 2018-06-19: 100 mL via INTRAVENOUS

## 2018-06-19 MED ORDER — SODIUM CHLORIDE (PF) 0.9 % IJ SOLN
INTRAMUSCULAR | Status: AC
  Filled 2018-06-19: qty 50

## 2018-06-21 ENCOUNTER — Inpatient Hospital Stay: Payer: Medicare Other

## 2018-06-21 ENCOUNTER — Telehealth: Payer: Self-pay | Admitting: Internal Medicine

## 2018-06-21 ENCOUNTER — Encounter: Payer: Self-pay | Admitting: Internal Medicine

## 2018-06-21 ENCOUNTER — Inpatient Hospital Stay (HOSPITAL_BASED_OUTPATIENT_CLINIC_OR_DEPARTMENT_OTHER): Payer: Medicare Other | Admitting: Internal Medicine

## 2018-06-21 ENCOUNTER — Other Ambulatory Visit: Payer: Self-pay | Admitting: Medical Oncology

## 2018-06-21 ENCOUNTER — Other Ambulatory Visit: Payer: Self-pay

## 2018-06-21 VITALS — BP 106/72 | HR 77 | Temp 98.4°F | Resp 18 | Ht 73.0 in | Wt 236.7 lb

## 2018-06-21 DIAGNOSIS — R05 Cough: Secondary | ICD-10-CM

## 2018-06-21 DIAGNOSIS — R0602 Shortness of breath: Secondary | ICD-10-CM

## 2018-06-21 DIAGNOSIS — C3431 Malignant neoplasm of lower lobe, right bronchus or lung: Secondary | ICD-10-CM

## 2018-06-21 DIAGNOSIS — J22 Unspecified acute lower respiratory infection: Secondary | ICD-10-CM | POA: Diagnosis not present

## 2018-06-21 DIAGNOSIS — Z20828 Contact with and (suspected) exposure to other viral communicable diseases: Secondary | ICD-10-CM | POA: Diagnosis not present

## 2018-06-21 DIAGNOSIS — R6889 Other general symptoms and signs: Secondary | ICD-10-CM

## 2018-06-21 DIAGNOSIS — Z79899 Other long term (current) drug therapy: Secondary | ICD-10-CM | POA: Diagnosis not present

## 2018-06-21 DIAGNOSIS — I10 Essential (primary) hypertension: Secondary | ICD-10-CM

## 2018-06-21 DIAGNOSIS — Z5111 Encounter for antineoplastic chemotherapy: Secondary | ICD-10-CM

## 2018-06-21 LAB — INFLUENZA PANEL BY PCR (TYPE A & B)
Influenza A By PCR: NEGATIVE
Influenza B By PCR: NEGATIVE

## 2018-06-21 MED ORDER — ERLOTINIB HCL 100 MG PO TABS: 100.0000 mg | ORAL_TABLET | Freq: Every day | ORAL | 3 refills | Status: DC

## 2018-06-21 NOTE — Telephone Encounter (Signed)
Scheduled appt per 3/19 los.  Printed calendar and avs.

## 2018-06-21 NOTE — Patient Instructions (Signed)
° ° °  Person Under Monitoring Name: [name]  Location: [place] [address]   CORONAVIRUS DISEASE 2019 (COVID-19) Guidance for Persons Under Investigation You are being tested for the virus that causes coronavirus disease 2019 (COVID-19). Public health actions are necessary to ensure protection of your health and the health of others, and to prevent further spread of infection. COVID-19 is caused by a virus that can cause symptoms, such as fever, cough, and shortness of breath. The primary transmission from person to person is by coughing or sneezing. On May 03, 2018, the [place] announced a [employer] Emergency of International Concern and on May 04, 2018 the U.S. Department of Health and Human Services declared a public health emergency. If the virus that causesCOVID-19 spreads in the community, it could have severe public health consequences.  As a person under investigation for COVID-19, the Agency advises you to adhere to the following guidance until your test results are reported to you. If your test result is positive, you will receive additional information from your provider and your local health department at that time.   Remain at home until you are cleared by your health provider or public health authorities.   Keep a log of visitors to your home using the form provided. Any visitors to your home must be aware of your isolation status.  If you plan to move to a new address or leave the county, notify the local health department in your county.  Call a doctor or seek care if you have an urgent medical need. Before seeking medical care, call ahead and get instructions from the provider before arriving at the medical office, clinic or hospital. Notify them that you are being tested for the virus that causes COVID-19 so arrangements can be made, as necessary, to  prevent transmission to others in the healthcare setting. Next, notify the local health department in your county.  If a medical emergency arises and you need to call 911, inform the first responders that you are being tested for the virus that causes COVID-19. Next, notify the local health department in your county.  Adhere to all guidance set forth by the [place] [facility] of patients that is based on guidance from the Center for Disease Control and Prevention with suspected or confirmed COVID-19. It is provided with this guidance for Persons Under Investigation.  Your health and the health of our community are our top priorities. Public Health officials remain available to provide assistance and counseling to you about COVID-19 and compliance with this guidance.  Provider: ____________________________________________________________ Date: ______/_____/_________  By signing below, you acknowledge that you have read and agree to comply with this Guidance for Persons Under Investigation. ______________________________________________________________ Date: ______/_____/_________  WHO DO I CALL? You can find a list of local health departments here: [email or web address] Health Department: ____________________________________________________________________ Contact Name: ________________________________________________________________________ Telephone: ___________________________________________________________________________  Marice Potter, [place], Communicable Disease Branch COVID-19 Guidance for Persons Under Investigation June 09, 2018

## 2018-06-21 NOTE — Progress Notes (Signed)
Nasal swabs collected and sent for influenza ( right nare)  and covid -19 ( Left nare)

## 2018-06-21 NOTE — Progress Notes (Signed)
[facility] [phone]) [phone]   Fax:(336) [phone]  OFFICE PROGRESS NOTE  [name], MD 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  PRIOR THERAPY: 1) Status post right upper lobectomy under the care of Dr. Roxan Hockey on 01/23/2006.  2) Tarceva 150 mg by mouth daily as part of the BMS checkmate 370 clinical trial. Status post 6 weeks of treatment.  CURRENT THERAPY: Tarceva 100 mg by mouth daily as part of the BMS Checkmate 370 clinical trial. First dose started 07/17/2014 status post 47 months of treatment.  INTERVAL HISTORY: [name] 73 y.o. male returns to the clinic today for follow-up visit accompanied by his wife.  The patient has been complaining of cough as well as shortness of breath over the last few days.  He denied having any chest pain or hemoptysis.  He indicated that he has no fever or chills.  He denied having any nausea, vomiting, diarrhea or constipation.  He denied having any headache or visual changes.  The patient has no recent weight loss or night sweats.  He has been tolerating his treatment with Tarceva fairly well.  He is here today for evaluation with repeat CT scan of the chest, abdomen and pelvis for restaging of his disease.  MEDICAL HISTORY: Past Medical History:  Diagnosis Date   Cholelithiases 5/99/3570   Complication of anesthesia 2007   quit breathing with bronchoscopy, had to spend night   History of agent Orange exposure 44-45 yrs ago   History of pneumonia  last 3-4 yrs ago   3 -4 different times   Hypercholesterolemia    Hypertension    Hypothyroidism    lung ca dx'd 2007   lung right   Sinus disease    hx of   Sleep apnea    uses cpap setting opf 12    ALLERGIES:  is allergic to lisinopril and  prednisone.  MEDICATIONS:  Current Outpatient Medications  Medication Sig Dispense Refill   amitriptyline (ELAVIL) 50 MG tablet Take 50 mg by mouth at bedtime. Reported on 06/18/2015     aspirin 81 MG tablet Take 81 mg by mouth every morning.      Cholecalciferol 2000 units CAPS Take 2,000 Units by mouth daily.      clindamycin (CLEOCIN-T) 1 % external solution Apply 1 application topically 2 (two) times daily as needed (for skin irritation). 240 mL 0   diltiazem (CARDIZEM CD) 180 MG 24 hr capsule Take 1 capsule (180 mg total) by mouth daily. 90 capsule 3   erlotinib (TARCEVA) 100 MG tablet Take 1 tablet (100 mg total) by mouth daily. Take on an empty stomach 1 hour before meals or 2 hours after.faxed to [facility] 90 tablet 1   famotidine (PEPCID) 20 MG tablet Take 1 tablet (20 mg total) by mouth daily. 10 tablet 0   levETIRAcetam (KEPPRA) 750 MG tablet Take 750 mg by mouth at bedtime.      metoprolol succinate (TOPROL-XL) 50 MG 24 hr tablet Take 1 tablet (50 mg total) by mouth daily. Take with or immediately following a meal. 90 tablet 3   Multiple Vitamins-Minerals (CENTRUM SILVER PO) Take 1 tablet by mouth daily.      nitroGLYCERIN (NITROSTAT) 0.4 MG SL tablet Place 1 tablet (0.4 mg total) under the tongue every 5 (  five) minutes as needed for chest pain. Reported on 10/22/2015 30 tablet 11   polyethylene glycol (MIRALAX / GLYCOLAX) packet Take 17 g by mouth daily. Reported on 08/27/2015     psyllium (METAMUCIL) 58.6 % powder Take 1 packet by mouth daily. Reported on 08/27/2015     rosuvastatin (CRESTOR) 5 MG tablet Take 5 mg by mouth every morning.      SYNTHROID 112 MCG tablet Take 112 mcg by mouth daily.     valsartan-hydrochlorothiazide (DIOVAN-HCT) 320-25 MG per tablet Take 1 tablet by mouth every morning.      No current facility-administered medications for this visit.     SURGICAL HISTORY:  Past Surgical History:  Procedure Laterality Date   BIOPSY  05/21/2018    Procedure: BIOPSY;  Surgeon: Wonda Horner, MD;  Location: [facility];  Service: Endoscopy;;   COLONOSCOPY WITH PROPOFOL N/A 03/19/2013   Procedure: COLONOSCOPY WITH PROPOFOL;  Surgeon: Garlan Fair, MD;  Location: [facility];  Service: Endoscopy;  Laterality: N/A;   Cystourethroscopy, [facility] T[facility]P.  04/16/2010   ESOPHAGOGASTRODUODENOSCOPY (EGD) WITH PROPOFOL N/A 05/21/2018   Procedure: ESOPHAGOGASTRODUODENOSCOPY (EGD) WITH PROPOFOL;  Surgeon: Wonda Horner, MD;  Location: [facility];  Service: Endoscopy;  Laterality: N/A;   NASAL SINUS SURGERY  1987   Right video-assisted thoracoscopy, right upper lobectomy and  01/23/2006   The Olympus video bronchoscope was introduced via the right  12/21/2005   TONSILLECTOMY  1957   Torn medial and lateral menisci, left knee.  11/06/2006    REVIEW OF SYSTEMS:  Constitutional: positive for fatigue Eyes: negative Ears, nose, mouth, throat, and face: negative Respiratory: positive for cough and dyspnea on exertion Cardiovascular: negative Gastrointestinal: negative Genitourinary:negative Integument/breast: negative Hematologic/lymphatic: negative Musculoskeletal:negative Neurological: negative Behavioral/Psych: negative Endocrine: negative Allergic/Immunologic: negative   PHYSICAL EXAMINATION: General appearance: alert, cooperative, fatigued and no distress Head: Normocephalic, without obvious abnormality, atraumatic Neck: no adenopathy, no JVD, supple, symmetrical, trachea midline and thyroid not enlarged, symmetric, no tenderness/mass/nodules Lymph nodes: Cervical, supraclavicular, and axillary nodes normal. Resp: clear to auscultation bilaterally Back: symmetric, no curvature. ROM normal. No CVA tenderness. Cardio: regular rate and rhythm, S1, S2 normal, no murmur, click, rub or gallop GI: soft, non-tender; bowel sounds normal; no masses,  no organomegaly Extremities: extremities normal, atraumatic, no cyanosis or  edema Neurologic: Alert and oriented X 3, normal strength and tone. Normal symmetric reflexes. Normal coordination and gait  ECOG PERFORMANCE STATUS: 1 - Symptomatic but completely ambulatory  Blood pressure 106/72, pulse 77, temperature 98.4 F (36.9 C), temperature source Oral, resp. rate 18, height '6\' 1"'  (1.854 m), weight 236 lb 11.2 oz (107.4 kg), SpO2 95 %.  LABORATORY DATA: Lab Results  Component Value Date   WBC 8.4 06/18/2018   HGB 13.3 06/18/2018   HCT 39.4 06/18/2018   MCV 101.3 (H) 06/18/2018   PLT 193 06/18/2018      Chemistry      Component Value Date/Time   NA 136 06/18/2018 0938   NA 139 04/06/2017 0932   K 3.6 06/18/2018 0938   K 3.8 04/06/2017 0932   CL 99 06/18/2018 0938   CL 102 04/24/2012 0853   CO2 28 06/18/2018 0938   CO2 29 04/06/2017 0932   BUN 13 06/18/2018 0938   BUN 14.4 04/06/2017 0932   CREATININE 0.97 06/18/2018 0938   CREATININE 1.0 04/06/2017 0932      Component Value Date/Time   CALCIUM 9.3 06/18/2018 0938   CALCIUM 9.5 04/06/2017 0932   ALKPHOS 60 06/18/2018  6203   ALKPHOS 58 04/06/2017 0932   AST 31 06/18/2018 0938   AST 27 04/06/2017 0932   ALT 37 06/18/2018 0938   ALT 27 04/06/2017 0932   BILITOT 0.8 06/18/2018 0938   BILITOT 0.45 04/06/2017 0932       RADIOGRAPHIC STUDIES: Ct Chest W Contrast  Result Date: 06/19/2018 CLINICAL DATA:  Recurrent lung cancer EXAM: CT CHEST, ABDOMEN, AND PELVIS WITH CONTRAST TECHNIQUE: Multidetector CT imaging of the chest, abdomen and pelvis was performed following the standard protocol during bolus administration of intravenous contrast. CONTRAST:  165m OMNIPAQUE IOHEXOL 300 MG/ML  SOLN COMPARISON:  03/06/2018, 12/12/2017 FINDINGS: CT CHEST FINDINGS Cardiovascular: No change in caliber of the tubular thoracic aorta measuring 4.0 x 3.9 cm. Normal heart size. No pericardial effusion. Mediastinum/Nodes: No enlarged mediastinal, hilar, or axillary lymph nodes. Thyroid gland, trachea, and esophagus  demonstrate no significant findings. Lungs/Pleura: Status post right upper lobectomy. No significant change in metastatic nodules of the right lower lobe measuring 2.3 x 1.9 cm, previously 2.4 x 4.0 cm (series 6, image 60). Musculoskeletal: No chest wall mass or suspicious bone lesions identified. CT ABDOMEN PELVIS FINDINGS Hepatobiliary: No focal liver abnormality is seen. Gallstones in the gallbladder. No gallbladder wall thickening, or biliary dilatation. Pancreas: Unremarkable. No pancreatic ductal dilatation or surrounding inflammatory changes. Spleen: Normal in size without focal abnormality. Adrenals/Urinary Tract: Adrenal glands are unremarkable. Kidneys are normal, without renal calculi, focal lesion, or hydronephrosis. Bladder is unremarkable. Stomach/Bowel: Stomach is within normal limits. Appendix appears normal. No evidence of bowel wall thickening, distention, or inflammatory changes. Vascular/Lymphatic: Scattered calcific atherosclerosis. No enlarged abdominal or pelvic lymph nodes. Reproductive: No mass or other abnormality. Other: No abdominal wall hernia or abnormality. No abdominopelvic ascites. Musculoskeletal: No acute or significant osseous findings. IMPRESSION: 1. Status post right upper lobectomy. No significant change in metastatic nodules of the right lower lobe measuring 2.3 x 1.9 cm, previously 2.4 x 4.0 cm (series 6, image 60). 2.  No evidence of metastatic disease in the abdomen or pelvis. 3.  Other chronic and incidental findings as detailed above. Electronically Signed   By: AEddie CandleM.D.   On: 06/19/2018 12:47   Ct Abdomen Pelvis W Contrast  Result Date: 06/19/2018 CLINICAL DATA:  Recurrent lung cancer EXAM: CT CHEST, ABDOMEN, AND PELVIS WITH CONTRAST TECHNIQUE: Multidetector CT imaging of the chest, abdomen and pelvis was performed following the standard protocol during bolus administration of intravenous contrast. CONTRAST:  1028mOMNIPAQUE IOHEXOL 300 MG/ML  SOLN  COMPARISON:  03/06/2018, 12/12/2017 FINDINGS: CT CHEST FINDINGS Cardiovascular: No change in caliber of the tubular thoracic aorta measuring 4.0 x 3.9 cm. Normal heart size. No pericardial effusion. Mediastinum/Nodes: No enlarged mediastinal, hilar, or axillary lymph nodes. Thyroid gland, trachea, and esophagus demonstrate no significant findings. Lungs/Pleura: Status post right upper lobectomy. No significant change in metastatic nodules of the right lower lobe measuring 2.3 x 1.9 cm, previously 2.4 x 4.0 cm (series 6, image 60). Musculoskeletal: No chest wall mass or suspicious bone lesions identified. CT ABDOMEN PELVIS FINDINGS Hepatobiliary: No focal liver abnormality is seen. Gallstones in the gallbladder. No gallbladder wall thickening, or biliary dilatation. Pancreas: Unremarkable. No pancreatic ductal dilatation or surrounding inflammatory changes. Spleen: Normal in size without focal abnormality. Adrenals/Urinary Tract: Adrenal glands are unremarkable. Kidneys are normal, without renal calculi, focal lesion, or hydronephrosis. Bladder is unremarkable. Stomach/Bowel: Stomach is within normal limits. Appendix appears normal. No evidence of bowel wall thickening, distention, or inflammatory changes. Vascular/Lymphatic: Scattered calcific atherosclerosis. No enlarged  abdominal or pelvic lymph nodes. Reproductive: No mass or other abnormality. Other: No abdominal wall hernia or abnormality. No abdominopelvic ascites. Musculoskeletal: No acute or significant osseous findings. IMPRESSION: 1. Status post right upper lobectomy. No significant change in metastatic nodules of the right lower lobe measuring 2.3 x 1.9 cm, previously 2.4 x 4.0 cm (series 6, image 60). 2.  No evidence of metastatic disease in the abdomen or pelvis. 3.  Other chronic and incidental findings as detailed above. Electronically Signed   By: Eddie Candle M.D.   On: 06/19/2018 12:47    ASSESSMENT AND PLAN:  This is a very pleasant 73 years  old white male with recurrent non-small cell lung cancer, adenocarcinoma and positive EGFR mutation in exon 21. He is currently on Tarceva 100 mg by mouth daily status post more than 47 months of treatment. The patient has been tolerating his treatment with Tarceva fairly well with no concerning complaints. He is complaining today of lower respiratory symptoms with persistent dry cough as well as shortness of breath. He had repeat CT scan of the chest performed recently.  I personally and independently reviewed the scans and discussed the results with the patient and his wife. His scan showed no concerning findings for disease progression. I recommended for the patient to continue his current treatment with Tarceva with the same dose. For the lower respiratory symptoms, the patient was immediately provided with a face mask and gloves and will check him today for influenza A and B as well as COVID 19.  The patient will be placed on home isolation for now until the results becomes available. He will come back for follow-up visit in 6 weeks for evaluation and repeat blood work. He was advised to call immediately if he has any concerning symptoms in the interval. The patient voices understanding of current disease status and treatment options and is in agreement with the current care plan. All questions were answered. The patient knows to call the clinic with any problems, questions or concerns. We can certainly see the patient much sooner if necessary.  Disclaimer: This note was dictated with voice recognition software. Similar sounding words can inadvertently be transcribed and may not be corrected upon review.

## 2018-06-22 ENCOUNTER — Telehealth: Payer: Self-pay | Admitting: *Deleted

## 2018-06-22 NOTE — Telephone Encounter (Signed)
Patient called asking if we had gotten flu results in yet.  Per Chart only Influenza A & B were resulted.  Still pending results of Covid 19.  Advised this result can take several days at this point.  Anticipate a call Monday or call back to question.

## 2018-06-27 DIAGNOSIS — D649 Anemia, unspecified: Secondary | ICD-10-CM | POA: Diagnosis not present

## 2018-06-27 DIAGNOSIS — C3490 Malignant neoplasm of unspecified part of unspecified bronchus or lung: Secondary | ICD-10-CM | POA: Diagnosis not present

## 2018-06-27 DIAGNOSIS — I25110 Atherosclerotic heart disease of native coronary artery with unstable angina pectoris: Secondary | ICD-10-CM | POA: Diagnosis not present

## 2018-06-27 DIAGNOSIS — I208 Other forms of angina pectoris: Secondary | ICD-10-CM | POA: Diagnosis not present

## 2018-06-27 DIAGNOSIS — E782 Mixed hyperlipidemia: Secondary | ICD-10-CM | POA: Diagnosis not present

## 2018-06-27 DIAGNOSIS — I10 Essential (primary) hypertension: Secondary | ICD-10-CM | POA: Diagnosis not present

## 2018-06-27 DIAGNOSIS — E785 Hyperlipidemia, unspecified: Secondary | ICD-10-CM | POA: Diagnosis not present

## 2018-06-27 DIAGNOSIS — E7800 Pure hypercholesterolemia, unspecified: Secondary | ICD-10-CM | POA: Diagnosis not present

## 2018-06-27 DIAGNOSIS — E039 Hypothyroidism, unspecified: Secondary | ICD-10-CM | POA: Diagnosis not present

## 2018-06-27 DIAGNOSIS — N400 Enlarged prostate without lower urinary tract symptoms: Secondary | ICD-10-CM | POA: Diagnosis not present

## 2018-06-28 ENCOUNTER — Telehealth: Payer: Self-pay | Admitting: Medical Oncology

## 2018-06-28 LAB — NOVEL CORONAVIRUS, NAA (HOSP ORDER, SEND-OUT TO REF LAB; TAT 18-24 HRS): SARS-CoV-2, NAA: NOT DETECTED

## 2018-06-28 NOTE — Telephone Encounter (Signed)
Pt notified of covid19 negative result.

## 2018-07-01 ENCOUNTER — Other Ambulatory Visit: Payer: Self-pay | Admitting: Internal Medicine

## 2018-07-01 DIAGNOSIS — L270 Generalized skin eruption due to drugs and medicaments taken internally: Secondary | ICD-10-CM

## 2018-07-01 DIAGNOSIS — C3431 Malignant neoplasm of lower lobe, right bronchus or lung: Secondary | ICD-10-CM

## 2018-08-02 ENCOUNTER — Other Ambulatory Visit: Payer: Self-pay

## 2018-08-02 ENCOUNTER — Encounter: Payer: Self-pay | Admitting: Internal Medicine

## 2018-08-02 ENCOUNTER — Inpatient Hospital Stay (HOSPITAL_BASED_OUTPATIENT_CLINIC_OR_DEPARTMENT_OTHER): Payer: Medicare Other | Admitting: Internal Medicine

## 2018-08-02 ENCOUNTER — Inpatient Hospital Stay: Payer: Medicare Other | Attending: Internal Medicine

## 2018-08-02 VITALS — BP 130/70 | HR 73 | Temp 98.0°F | Resp 18 | Ht 73.0 in | Wt 229.6 lb

## 2018-08-02 DIAGNOSIS — Z5111 Encounter for antineoplastic chemotherapy: Secondary | ICD-10-CM

## 2018-08-02 DIAGNOSIS — C3431 Malignant neoplasm of lower lobe, right bronchus or lung: Secondary | ICD-10-CM | POA: Diagnosis not present

## 2018-08-02 DIAGNOSIS — E7800 Pure hypercholesterolemia, unspecified: Secondary | ICD-10-CM | POA: Diagnosis not present

## 2018-08-02 DIAGNOSIS — E039 Hypothyroidism, unspecified: Secondary | ICD-10-CM | POA: Diagnosis not present

## 2018-08-02 DIAGNOSIS — Z79899 Other long term (current) drug therapy: Secondary | ICD-10-CM | POA: Insufficient documentation

## 2018-08-02 DIAGNOSIS — Z7982 Long term (current) use of aspirin: Secondary | ICD-10-CM | POA: Diagnosis not present

## 2018-08-02 DIAGNOSIS — G4730 Sleep apnea, unspecified: Secondary | ICD-10-CM | POA: Diagnosis not present

## 2018-08-02 DIAGNOSIS — I10 Essential (primary) hypertension: Secondary | ICD-10-CM | POA: Diagnosis not present

## 2018-08-02 DIAGNOSIS — C3490 Malignant neoplasm of unspecified part of unspecified bronchus or lung: Secondary | ICD-10-CM

## 2018-08-02 DIAGNOSIS — J22 Unspecified acute lower respiratory infection: Secondary | ICD-10-CM

## 2018-08-02 LAB — CBC WITH DIFFERENTIAL (CANCER CENTER ONLY)
Abs Immature Granulocytes: 0.01 10*3/uL (ref 0.00–0.07)
Basophils Absolute: 0 10*3/uL (ref 0.0–0.1)
Basophils Relative: 1 %
Eosinophils Absolute: 0.3 10*3/uL (ref 0.0–0.5)
Eosinophils Relative: 5 %
HCT: 43.1 % (ref 39.0–52.0)
Hemoglobin: 14.6 g/dL (ref 13.0–17.0)
Immature Granulocytes: 0 %
Lymphocytes Relative: 41 %
Lymphs Abs: 2.1 10*3/uL (ref 0.7–4.0)
MCH: 34.2 pg — ABNORMAL HIGH (ref 26.0–34.0)
MCHC: 33.9 g/dL (ref 30.0–36.0)
MCV: 100.9 fL — ABNORMAL HIGH (ref 80.0–100.0)
Monocytes Absolute: 0.6 10*3/uL (ref 0.1–1.0)
Monocytes Relative: 11 %
Neutro Abs: 2.1 10*3/uL (ref 1.7–7.7)
Neutrophils Relative %: 42 %
Platelet Count: 208 10*3/uL (ref 150–400)
RBC: 4.27 MIL/uL (ref 4.22–5.81)
RDW: 12.3 % (ref 11.5–15.5)
WBC Count: 5 10*3/uL (ref 4.0–10.5)
nRBC: 0 % (ref 0.0–0.2)

## 2018-08-02 LAB — CMP (CANCER CENTER ONLY)
ALT: 31 U/L (ref 0–44)
AST: 28 U/L (ref 15–41)
Albumin: 4.1 g/dL (ref 3.5–5.0)
Alkaline Phosphatase: 68 U/L (ref 38–126)
Anion gap: 10 (ref 5–15)
BUN: 15 mg/dL (ref 8–23)
CO2: 28 mmol/L (ref 22–32)
Calcium: 9.5 mg/dL (ref 8.9–10.3)
Chloride: 104 mmol/L (ref 98–111)
Creatinine: 1 mg/dL (ref 0.61–1.24)
GFR, Est AFR Am: >60 mL/min (ref >60)
GFR, Estimated: >60 mL/min (ref >60)
Glucose, Bld: 98 mg/dL (ref 70–99)
Potassium: 3.9 mmol/L (ref 3.5–5.1)
Sodium: 142 mmol/L (ref 135–145)
Total Bilirubin: 0.6 mg/dL (ref 0.3–1.2)
Total Protein: 7 g/dL (ref 6.5–8.1)

## 2018-08-02 NOTE — Progress Notes (Signed)
[facility] Telephone:(336) [phone]   Fax:(336) [phone]  OFFICE PROGRESS NOTE  [name], MD 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  PRIOR THERAPY: 1) Status post right upper lobectomy under the care of Dr. Roxan Hockey on 01/23/2006.  2) Tarceva 150 mg by mouth daily as part of the BMS checkmate 370 clinical trial. Status post 6 weeks of treatment.  CURRENT THERAPY: Tarceva 100 mg by mouth daily as part of the BMS Checkmate 370 clinical trial. First dose started 07/17/2014 status post 48 months of treatment.  INTERVAL HISTORY: [name] 73 y.o. male returns to the clinic today for follow-up visit.  The patient is feeling fine today with no concerning complaints.  He denied having any chest pain, shortness of breath, cough or hemoptysis.  He has no nausea, vomiting, diarrhea or constipation.  He denied having any skin rash.  He lost few pounds since his last visit.  He continues to tolerate his treatment with Tarceva fairly well.  The patient is here today for evaluation and repeat blood work.  MEDICAL HISTORY: Past Medical History:  Diagnosis Date  . Cholelithiases 07/16/2015  . Complication of anesthesia 2007   quit breathing with bronchoscopy, had to spend night  . History of agent Orange exposure 44-45 yrs ago  . History of pneumonia  last 3-4 yrs ago   3 -4 different times  . Hypercholesterolemia   . Hypertension   . Hypothyroidism   . lung ca dx'd 2007   lung right  . Sinus disease    hx of  . Sleep apnea    uses cpap setting opf 12    ALLERGIES:  is allergic to lisinopril and prednisone.  MEDICATIONS:  Current Outpatient Medications  Medication Sig Dispense Refill  . amitriptyline (ELAVIL) 50 MG tablet Take 50 mg by mouth at bedtime. Reported on  06/18/2015    . aspirin 81 MG tablet Take 81 mg by mouth every morning.     . Cholecalciferol 2000 units CAPS Take 2,000 Units by mouth daily.     . clindamycin (CLEOCIN T) 1 % external solution APPLY TO AFFECTED AREA(S) TWO TIMES A DAY AS NEEDED FOR SKIN IRRITATION 30 mL 0  . diltiazem (CARDIZEM CD) 180 MG 24 hr capsule Take 1 capsule (180 mg total) by mouth daily. 90 capsule 3  . erlotinib (TARCEVA) 100 MG tablet Take 1 tablet (100 mg total) by mouth daily. Take on an empty stomach 1 hour before meals or 2 hours after.faxed to [facility] 90 tablet 3  . levETIRAcetam (KEPPRA) 750 MG tablet Take 750 mg by mouth at bedtime.     . metoprolol succinate (TOPROL-XL) 50 MG 24 hr tablet Take 1 tablet (50 mg total) by mouth daily. Take with or immediately following a meal. 90 tablet 3  . Multiple Vitamins-Minerals (CENTRUM SILVER PO) Take 1 tablet by mouth daily.     . nitroGLYCERIN (NITROSTAT) 0.4 MG SL tablet Place 1 tablet (0.4 mg total) under the tongue every 5 (five) minutes as needed for chest pain. Reported on 10/22/2015 (Patient not taking: Reported on 06/21/2018) 30 tablet 11  . polyethylene glycol (MIRALAX / GLYCOLAX) packet Take 17 g by mouth daily. Reported on 08/27/2015    . psyllium (METAMUCIL) 58.6 % powder Take 1 packet by mouth daily.  Reported on 08/27/2015    . rosuvastatin (CRESTOR) 5 MG tablet Take 5 mg by mouth every morning.     [name] SYNTHROID 112 MCG tablet Take 112 mcg by mouth daily.    . valsartan-hydrochlorothiazide (DIOVAN-HCT) 320-25 MG per tablet Take 1 tablet by mouth every morning.      No current facility-administered medications for this visit.     SURGICAL HISTORY:  Past Surgical History:  Procedure Laterality Date  . BIOPSY  05/21/2018   Procedure: BIOPSY;  Surgeon: Wonda Horner, MD;  Location: [facility];  Service: Endoscopy;;  . COLONOSCOPY WITH PROPOFOL N/A 03/19/2013   Procedure: COLONOSCOPY WITH PROPOFOL;  Surgeon: Garlan Fair, MD;  Location: [facility];   Service: Endoscopy;  Laterality: N/A;  . Cystourethroscopy, [facility] TURP.  04/16/2010  . ESOPHAGOGASTRODUODENOSCOPY (EGD) WITH PROPOFOL N/A 05/21/2018   Procedure: ESOPHAGOGASTRODUODENOSCOPY (EGD) WITH PROPOFOL;  Surgeon: Wonda Horner, MD;  Location: [facility];  Service: Endoscopy;  Laterality: N/A;  . NASAL SINUS SURGERY  1987  . Right video-assisted thoracoscopy, right upper lobectomy and  01/23/2006  . The Olympus video bronchoscope was introduced via the right  12/21/2005  . TONSILLECTOMY  1957  . Torn medial and lateral menisci, left knee.  11/06/2006    REVIEW OF SYSTEMS:  A comprehensive review of systems was negative.   PHYSICAL EXAMINATION: General appearance: alert, cooperative and no distress Head: Normocephalic, without obvious abnormality, atraumatic Neck: no adenopathy, no JVD, supple, symmetrical, trachea midline and thyroid not enlarged, symmetric, no tenderness/mass/nodules Lymph nodes: Cervical, supraclavicular, and axillary nodes normal. Resp: clear to auscultation bilaterally Back: symmetric, no curvature. ROM normal. No CVA tenderness. Cardio: regular rate and rhythm, S1, S2 normal, no murmur, click, rub or gallop GI: soft, non-tender; bowel sounds normal; no masses,  no organomegaly Extremities: extremities normal, atraumatic, no cyanosis or edema  ECOG PERFORMANCE STATUS: 1 - Symptomatic but completely ambulatory  Blood pressure 130/70, pulse 73, temperature 98 F (36.7 C), temperature source Oral, resp. rate 18, height 6' 1" (1.854 m), weight 229 lb 9.6 oz (104.1 kg), SpO2 100 %.  LABORATORY DATA: Lab Results  Component Value Date   WBC 5.0 08/02/2018   HGB 14.6 08/02/2018   HCT 43.1 08/02/2018   MCV 100.9 (H) 08/02/2018   PLT 208 08/02/2018      Chemistry      Component Value Date/Time   NA 136 06/18/2018 0938   NA 139 04/06/2017 0932   K 3.6 06/18/2018 0938   K 3.8 04/06/2017 0932   CL 99 06/18/2018 0938   CL 102 04/24/2012 0853   CO2 28  06/18/2018 0938   CO2 29 04/06/2017 0932   BUN 13 06/18/2018 0938   BUN 14.4 04/06/2017 0932   CREATININE 0.97 06/18/2018 0938   CREATININE 1.0 04/06/2017 0932      Component Value Date/Time   CALCIUM 9.3 06/18/2018 0938   CALCIUM 9.5 04/06/2017 0932   ALKPHOS 60 06/18/2018 0938   ALKPHOS 58 04/06/2017 0932   AST 31 06/18/2018 0938   AST 27 04/06/2017 0932   ALT 37 06/18/2018 0938   ALT 27 04/06/2017 0932   BILITOT 0.8 06/18/2018 0938   BILITOT 0.45 04/06/2017 0932       RADIOGRAPHIC STUDIES: No results found.  ASSESSMENT AND PLAN:  This is a very pleasant 73 years old white male with recurrent non-small cell lung cancer, adenocarcinoma and positive EGFR mutation in exon 21. He is currently on Tarceva 100 mg by mouth daily status post  more than 48 months of treatment. He continues to tolerate his treatment well with no concerning adverse effects. He was tested for [place] recently and it was negative. I recommended for him to continue his current treatment with Tarceva with the same dose. I will see him back for follow-up visit in 2 months with repeat CT scan of the chest, abdomen and pelvis for restaging of his disease. The patient was advised to call immediately if he has any concerning symptoms in the interval. The patient voices understanding of current disease status and treatment options and is in agreement with the current care plan. All questions were answered. The patient knows to call the clinic with any problems, questions or concerns. We can certainly see the patient much sooner if necessary.  Disclaimer: This note was dictated with voice recognition software. Similar sounding words can inadvertently be transcribed and may not be corrected upon review.

## 2018-08-03 ENCOUNTER — Telehealth: Payer: Self-pay | Admitting: Internal Medicine

## 2018-08-03 NOTE — Telephone Encounter (Signed)
Scheduled appt per 4/30 los - sent reminder letter in the mail with appt date and time

## 2018-08-16 DIAGNOSIS — G43111 Migraine with aura, intractable, with status migrainosus: Secondary | ICD-10-CM | POA: Diagnosis not present

## 2018-08-16 DIAGNOSIS — G43019 Migraine without aura, intractable, without status migrainosus: Secondary | ICD-10-CM | POA: Diagnosis not present

## 2018-08-22 ENCOUNTER — Telehealth: Payer: Self-pay | Admitting: *Deleted

## 2018-08-22 NOTE — Telephone Encounter (Signed)
Received call from pat requesting a change for his lab appt in June. Pt prefers to have his labs done the day before his CT scan, not the day of. Scheduling change made. Reviewed pt's schedule for June/July 2020. Pt approved schedule changes.  No further questions or concerns.

## 2018-10-01 ENCOUNTER — Other Ambulatory Visit: Payer: Self-pay

## 2018-10-01 ENCOUNTER — Inpatient Hospital Stay: Payer: Medicare Other | Attending: Internal Medicine

## 2018-10-01 DIAGNOSIS — Z79899 Other long term (current) drug therapy: Secondary | ICD-10-CM | POA: Insufficient documentation

## 2018-10-01 DIAGNOSIS — C3490 Malignant neoplasm of unspecified part of unspecified bronchus or lung: Secondary | ICD-10-CM

## 2018-10-01 DIAGNOSIS — C3431 Malignant neoplasm of lower lobe, right bronchus or lung: Secondary | ICD-10-CM | POA: Insufficient documentation

## 2018-10-01 LAB — CBC WITH DIFFERENTIAL (CANCER CENTER ONLY)
Abs Immature Granulocytes: 0.01 10*3/uL (ref 0.00–0.07)
Basophils Absolute: 0 10*3/uL (ref 0.0–0.1)
Basophils Relative: 1 %
Eosinophils Absolute: 0.3 10*3/uL (ref 0.0–0.5)
Eosinophils Relative: 5 %
HCT: 43.1 % (ref 39.0–52.0)
Hemoglobin: 14.6 g/dL (ref 13.0–17.0)
Immature Granulocytes: 0 %
Lymphocytes Relative: 39 %
Lymphs Abs: 2.3 10*3/uL (ref 0.7–4.0)
MCH: 34.1 pg — ABNORMAL HIGH (ref 26.0–34.0)
MCHC: 33.9 g/dL (ref 30.0–36.0)
MCV: 100.7 fL — ABNORMAL HIGH (ref 80.0–100.0)
Monocytes Absolute: 0.7 10*3/uL (ref 0.1–1.0)
Monocytes Relative: 12 %
Neutro Abs: 2.6 10*3/uL (ref 1.7–7.7)
Neutrophils Relative %: 43 %
Platelet Count: 203 10*3/uL (ref 150–400)
RBC: 4.28 MIL/uL (ref 4.22–5.81)
RDW: 12.3 % (ref 11.5–15.5)
WBC Count: 5.9 10*3/uL (ref 4.0–10.5)
nRBC: 0 % (ref 0.0–0.2)

## 2018-10-01 LAB — CMP (CANCER CENTER ONLY)
ALT: 28 U/L (ref 0–44)
AST: 27 U/L (ref 15–41)
Albumin: 4.2 g/dL (ref 3.5–5.0)
Alkaline Phosphatase: 59 U/L (ref 38–126)
Anion gap: 8 (ref 5–15)
BUN: 13 mg/dL (ref 8–23)
CO2: 29 mmol/L (ref 22–32)
Calcium: 9.5 mg/dL (ref 8.9–10.3)
Chloride: 103 mmol/L (ref 98–111)
Creatinine: 1.05 mg/dL (ref 0.61–1.24)
GFR, Est AFR Am: >60 mL/min (ref >60)
GFR, Estimated: >60 mL/min (ref >60)
Glucose, Bld: 100 mg/dL — ABNORMAL HIGH (ref 70–99)
Potassium: 3.6 mmol/L (ref 3.5–5.1)
Sodium: 140 mmol/L (ref 135–145)
Total Bilirubin: 0.7 mg/dL (ref 0.3–1.2)
Total Protein: 7 g/dL (ref 6.5–8.1)

## 2018-10-02 ENCOUNTER — Ambulatory Visit (HOSPITAL_COMMUNITY)
Admission: RE | Admit: 2018-10-02 | Discharge: 2018-10-02 | Disposition: A | Discharge status: Home / Self Care | Payer: Medicare Other | Source: Ambulatory Visit | Attending: Internal Medicine | Admitting: Internal Medicine

## 2018-10-02 ENCOUNTER — Other Ambulatory Visit: Payer: Medicare Other

## 2018-10-02 ENCOUNTER — Encounter (HOSPITAL_COMMUNITY): Payer: Self-pay

## 2018-10-02 DIAGNOSIS — C3490 Malignant neoplasm of unspecified part of unspecified bronchus or lung: Secondary | ICD-10-CM | POA: Diagnosis not present

## 2018-10-02 DIAGNOSIS — K8020 Calculus of gallbladder without cholecystitis without obstruction: Secondary | ICD-10-CM | POA: Diagnosis not present

## 2018-10-02 DIAGNOSIS — C7801 Secondary malignant neoplasm of right lung: Secondary | ICD-10-CM | POA: Diagnosis not present

## 2018-10-02 DIAGNOSIS — C801 Malignant (primary) neoplasm, unspecified: Secondary | ICD-10-CM | POA: Diagnosis not present

## 2018-10-02 MED ORDER — IOHEXOL 300 MG/ML  SOLN
100.0000 mL | Freq: Once | INTRAMUSCULAR | Status: AC | PRN
  Administered 2018-10-02: 100 mL via INTRAVENOUS

## 2018-10-02 MED ORDER — SODIUM CHLORIDE (PF) 0.9 % IJ SOLN
INTRAMUSCULAR | Status: AC
  Filled 2018-10-02: qty 50

## 2018-10-04 ENCOUNTER — Other Ambulatory Visit: Payer: Self-pay

## 2018-10-04 ENCOUNTER — Encounter: Payer: Self-pay | Admitting: Internal Medicine

## 2018-10-04 ENCOUNTER — Telehealth: Payer: Self-pay | Admitting: Internal Medicine

## 2018-10-04 ENCOUNTER — Inpatient Hospital Stay: Payer: Medicare Other | Attending: Internal Medicine | Admitting: Internal Medicine

## 2018-10-04 VITALS — BP 135/88 | HR 72 | Temp 98.2°F | Resp 18 | Ht 73.0 in | Wt 232.1 lb

## 2018-10-04 DIAGNOSIS — Z902 Acquired absence of lung [part of]: Secondary | ICD-10-CM | POA: Insufficient documentation

## 2018-10-04 DIAGNOSIS — I10 Essential (primary) hypertension: Secondary | ICD-10-CM | POA: Insufficient documentation

## 2018-10-04 DIAGNOSIS — E039 Hypothyroidism, unspecified: Secondary | ICD-10-CM | POA: Insufficient documentation

## 2018-10-04 DIAGNOSIS — E7800 Pure hypercholesterolemia, unspecified: Secondary | ICD-10-CM | POA: Diagnosis not present

## 2018-10-04 DIAGNOSIS — Z5111 Encounter for antineoplastic chemotherapy: Secondary | ICD-10-CM

## 2018-10-04 DIAGNOSIS — Z7982 Long term (current) use of aspirin: Secondary | ICD-10-CM | POA: Diagnosis not present

## 2018-10-04 DIAGNOSIS — Z79899 Other long term (current) drug therapy: Secondary | ICD-10-CM | POA: Diagnosis not present

## 2018-10-04 DIAGNOSIS — C3431 Malignant neoplasm of lower lobe, right bronchus or lung: Secondary | ICD-10-CM | POA: Insufficient documentation

## 2018-10-04 NOTE — Progress Notes (Signed)
[facility] [phone]   [phone]  OFFICE PROGRESS NOTE  [name], MD 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  PRIOR THERAPY: 1) Status post right upper lobectomy under the care of Dr. Roxan Hockey on 01/23/2006.  2) Tarceva 150 mg by mouth daily as part of the BMS checkmate 370 clinical trial. Status post 6 weeks of treatment.  CURRENT THERAPY: Tarceva 100 mg by mouth daily as part of the BMS Checkmate 370 clinical trial. First dose started 07/17/2014 status post 48 months of treatment.  INTERVAL HISTORY: [name] 73 y.o. male returns to the clinic today for follow-up visit.  The patient is feeling fine today with no concerning complaints.  He denied having any chest pain, shortness of breath, cough or hemoptysis.  He denied having any skin rash or diarrhea.  He has no nausea, vomiting or constipation.  He has no recent weight loss or night sweats.  He continues to tolerate his treatment with Tarceva fairly well.  He had repeat CT scan of the chest, abdomen pelvis performed recently and he is here for evaluation and discussion of his scan results.  MEDICAL HISTORY: Past Medical History:  Diagnosis Date   Cholelithiases 4/49/2010   Complication of anesthesia 2007   quit breathing with bronchoscopy, had to spend night   History of agent Orange exposure 44-45 yrs ago   History of pneumonia  last 3-4 yrs ago   3 -4 different times   Hypercholesterolemia    Hypertension    Hypothyroidism    lung ca dx'd 2007   lung right   Sinus disease    hx of   Sleep apnea    uses cpap setting opf 12    ALLERGIES:  is allergic to lisinopril and prednisone.  MEDICATIONS:  Current Outpatient Medications  Medication Sig Dispense Refill    amitriptyline (ELAVIL) 50 MG tablet Take 50 mg by mouth at bedtime. Reported on 06/18/2015     aspirin 81 MG tablet Take 81 mg by mouth every morning.      Cholecalciferol 2000 units CAPS Take 2,000 Units by mouth daily.      clindamycin (CLEOCIN T) 1 % external solution APPLY TO AFFECTED AREA(S) TWO TIMES A DAY AS NEEDED FOR SKIN IRRITATION 30 mL 0   diltiazem (CARDIZEM CD) 180 MG 24 hr capsule Take 1 capsule (180 mg total) by mouth daily. 90 capsule 3   erlotinib (TARCEVA) 100 MG tablet Take 1 tablet (100 mg total) by mouth daily. Take on an empty stomach 1 hour before meals or 2 hours after.faxed to [facility] 90 tablet 3   levETIRAcetam (KEPPRA) 750 MG tablet Take 750 mg by mouth at bedtime.      metoprolol succinate (TOPROL-XL) 50 MG 24 hr tablet Take 1 tablet (50 mg total) by mouth daily. Take with or immediately following a meal. 90 tablet 3   Multiple Vitamins-Minerals (CENTRUM SILVER PO) Take 1 tablet by mouth daily.      nitroGLYCERIN (NITROSTAT) 0.4 MG SL tablet Place 1 tablet (0.4 mg total) under the tongue every 5 (five) minutes as needed for chest pain. Reported on 10/22/2015 (Patient not taking: Reported on 06/21/2018) 30 tablet 11   polyethylene glycol (MIRALAX / GLYCOLAX) packet Take 17 g by mouth daily. Reported on 08/27/2015  psyllium (METAMUCIL) 58.6 % powder Take 1 packet by mouth daily. Reported on 08/27/2015     rosuvastatin (CRESTOR) 5 MG tablet Take 5 mg by mouth every morning.      SYNTHROID 112 MCG tablet Take 112 mcg by mouth daily.     valsartan-hydrochlorothiazide (DIOVAN-HCT) 320-25 MG per tablet Take 1 tablet by mouth every morning.      No current facility-administered medications for this visit.     SURGICAL HISTORY:  Past Surgical History:  Procedure Laterality Date   BIOPSY  05/21/2018   Procedure: BIOPSY;  Surgeon: Wonda Horner, MD;  Location: [facility];  Service: Endoscopy;;   COLONOSCOPY WITH PROPOFOL N/A 03/19/2013   Procedure:  COLONOSCOPY WITH PROPOFOL;  Surgeon: Garlan Fair, MD;  Location: [facility];  Service: Endoscopy;  Laterality: N/A;   Cystourethroscopy, [facility] T[facility].  04/16/2010   ESOPHAGOGASTRODUODENOSCOPY (EGD) WITH PROPOFOL N/A 05/21/2018   Procedure: ESOPHAGOGASTRODUODENOSCOPY (EGD) WITH PROPOFOL;  Surgeon: Wonda Horner, MD;  Location: [facility];  Service: Endoscopy;  Laterality: N/A;   NASAL SINUS SURGERY  1987   Right video-assisted thoracoscopy, right upper lobectomy and  01/23/2006   The Olympus video bronchoscope was introduced via the right  12/21/2005   TONSILLECTOMY  1957   Torn medial and lateral menisci, left knee.  11/06/2006    REVIEW OF SYSTEMS:  Constitutional: negative Eyes: negative Ears, nose, mouth, throat, and face: negative Respiratory: negative Cardiovascular: negative Gastrointestinal: negative Genitourinary:negative Integument/breast: negative Hematologic/lymphatic: negative Musculoskeletal:negative Neurological: negative Behavioral/Psych: negative Endocrine: negative Allergic/Immunologic: negative   PHYSICAL EXAMINATION: General appearance: alert, cooperative and no distress Head: Normocephalic, without obvious abnormality, atraumatic Neck: no adenopathy, no JVD, supple, symmetrical, trachea midline and thyroid not enlarged, symmetric, no tenderness/mass/nodules Lymph nodes: Cervical, supraclavicular, and axillary nodes normal. Resp: clear to auscultation bilaterally Back: symmetric, no curvature. ROM normal. No CVA tenderness. Cardio: regular rate and rhythm, S1, S2 normal, no murmur, click, rub or gallop GI: soft, non-tender; bowel sounds normal; no masses,  no organomegaly Extremities: extremities normal, atraumatic, no cyanosis or edema Neurologic: Alert and oriented X 3, normal strength and tone. Normal symmetric reflexes. Normal coordination and gait  ECOG PERFORMANCE STATUS: 1 - Symptomatic but completely ambulatory  Blood pressure 135/88,  pulse 72, temperature 98.2 F (36.8 C), temperature source Oral, resp. rate 18, height '6\' 1"'  (1.854 m), weight 232 lb 1.6 oz (105.3 kg), SpO2 96 %.  LABORATORY DATA: Lab Results  Component Value Date   WBC 5.9 10/01/2018   HGB 14.6 10/01/2018   HCT 43.1 10/01/2018   MCV 100.7 (H) 10/01/2018   PLT 203 10/01/2018      Chemistry      Component Value Date/Time   NA 140 10/01/2018 1003   NA 139 04/06/2017 0932   K 3.6 10/01/2018 1003   K 3.8 04/06/2017 0932   CL 103 10/01/2018 1003   CL 102 04/24/2012 0853   CO2 29 10/01/2018 1003   CO2 29 04/06/2017 0932   BUN 13 10/01/2018 1003   BUN 14.4 04/06/2017 0932   CREATININE 1.05 10/01/2018 1003   CREATININE 1.0 04/06/2017 0932      Component Value Date/Time   CALCIUM 9.5 10/01/2018 1003   CALCIUM 9.5 04/06/2017 0932   ALKPHOS 59 10/01/2018 1003   ALKPHOS 58 04/06/2017 0932   AST 27 10/01/2018 1003   AST 27 04/06/2017 0932   ALT 28 10/01/2018 1003   ALT 27 04/06/2017 0932   BILITOT 0.7 10/01/2018 1003   BILITOT 0.45 04/06/2017 0932  RADIOGRAPHIC STUDIES: Ct Chest W Contrast  Result Date: 10/02/2018 CLINICAL DATA:  Recurrent right lung adenocarcinoma in 2015. Right upper lobectomy in 2007. Ongoing oral chemotherapy. Restaging. EXAM: CT CHEST, ABDOMEN, AND PELVIS WITH CONTRAST TECHNIQUE: Multidetector CT imaging of the chest, abdomen and pelvis was performed following the standard protocol during bolus administration of intravenous contrast. CONTRAST:  154m OMNIPAQUE IOHEXOL 300 MG/ML  SOLN COMPARISON:  06/19/2018 CT chest, abdomen and pelvis. FINDINGS: CT CHEST FINDINGS Cardiovascular: Normal heart size. No significant pericardial effusion/thickening. Mildly atherosclerotic nonaneurysmal thoracic aorta. Normal caliber pulmonary arteries. No central pulmonary emboli. Mediastinum/Nodes: No discrete thyroid nodules. Unremarkable esophagus. No pathologically enlarged axillary, mediastinal or hilar lymph nodes. Lungs/Pleura: No  pneumothorax. No pleural effusion. Status post right upper lobectomy. Irregular predominantly solid anterior right lower lobe 2.5 x 1.4 cm pulmonary nodule (series 7/image 50), previously 2.5 x 1.4 cm using similar measurement technique, stable. Sub solid paramediastinal right lower lobe 1.5 x 1.1 cm nodule (series 7/image 34), previously 1.5 x 1.1 cm, stable. No acute consolidative airspace disease, lung masses or new significant pulmonary nodules. Musculoskeletal:  No aggressive appearing focal osseous lesions. CT ABDOMEN PELVIS FINDINGS Hepatobiliary: Normal liver with no liver mass. Cholelithiasis. No biliary ductal dilatation. Pancreas: Normal, with no mass or duct dilation. Spleen: Normal size. No mass. Adrenals/Urinary Tract: Normal adrenals. No hydronephrosis. Simple 2.0 cm exophytic anterior upper right renal cyst. No suspicious renal masses. Normal bladder. Stomach/Bowel: Normal non-distended stomach. Normal caliber small bowel with no small bowel wall thickening. Normal appendix. Moderate colonic stool volume. Oral contrast transits to the large bowel. Minimal sigmoid diverticulosis, with no large bowel wall thickening or significant pericolonic fat stranding. Vascular/Lymphatic: Mildly atherosclerotic nonaneurysmal abdominal aorta. Patent portal, splenic and renal veins. No pathologically enlarged lymph nodes in the abdomen or pelvis. Reproductive: Mildly enlarged prostate. Other: No pneumoperitoneum, ascites or focal fluid collection. Musculoskeletal: No aggressive appearing focal osseous lesions. Marked lumbar spondylosis. IMPRESSION: 1. No appreciable change in two right lower lobe pulmonary metastases. No thoracic adenopathy or other findings of new or progressive metastatic disease in the chest. 2. No metastatic disease in the abdomen or pelvis. 3. Chronic findings include: Aortic Atherosclerosis (ICD10-I70.0). Cholelithiasis. Mild prostatomegaly. Minimal sigmoid diverticulosis. Electronically  Signed   By: JIlona SorrelM.D.   On: 10/02/2018 12:16   Ct Abdomen Pelvis W Contrast  Result Date: 10/02/2018 CLINICAL DATA:  Recurrent right lung adenocarcinoma in 2015. Right upper lobectomy in 2007. Ongoing oral chemotherapy. Restaging. EXAM: CT CHEST, ABDOMEN, AND PELVIS WITH CONTRAST TECHNIQUE: Multidetector CT imaging of the chest, abdomen and pelvis was performed following the standard protocol during bolus administration of intravenous contrast. CONTRAST:  1067mOMNIPAQUE IOHEXOL 300 MG/ML  SOLN COMPARISON:  06/19/2018 CT chest, abdomen and pelvis. FINDINGS: CT CHEST FINDINGS Cardiovascular: Normal heart size. No significant pericardial effusion/thickening. Mildly atherosclerotic nonaneurysmal thoracic aorta. Normal caliber pulmonary arteries. No central pulmonary emboli. Mediastinum/Nodes: No discrete thyroid nodules. Unremarkable esophagus. No pathologically enlarged axillary, mediastinal or hilar lymph nodes. Lungs/Pleura: No pneumothorax. No pleural effusion. Status post right upper lobectomy. Irregular predominantly solid anterior right lower lobe 2.5 x 1.4 cm pulmonary nodule (series 7/image 50), previously 2.5 x 1.4 cm using similar measurement technique, stable. Sub solid paramediastinal right lower lobe 1.5 x 1.1 cm nodule (series 7/image 34), previously 1.5 x 1.1 cm, stable. No acute consolidative airspace disease, lung masses or new significant pulmonary nodules. Musculoskeletal:  No aggressive appearing focal osseous lesions. CT ABDOMEN PELVIS FINDINGS Hepatobiliary: Normal liver with no liver mass. Cholelithiasis. No  biliary ductal dilatation. Pancreas: Normal, with no mass or duct dilation. Spleen: Normal size. No mass. Adrenals/Urinary Tract: Normal adrenals. No hydronephrosis. Simple 2.0 cm exophytic anterior upper right renal cyst. No suspicious renal masses. Normal bladder. Stomach/Bowel: Normal non-distended stomach. Normal caliber small bowel with no small bowel wall thickening.  Normal appendix. Moderate colonic stool volume. Oral contrast transits to the large bowel. Minimal sigmoid diverticulosis, with no large bowel wall thickening or significant pericolonic fat stranding. Vascular/Lymphatic: Mildly atherosclerotic nonaneurysmal abdominal aorta. Patent portal, splenic and renal veins. No pathologically enlarged lymph nodes in the abdomen or pelvis. Reproductive: Mildly enlarged prostate. Other: No pneumoperitoneum, ascites or focal fluid collection. Musculoskeletal: No aggressive appearing focal osseous lesions. Marked lumbar spondylosis. IMPRESSION: 1. No appreciable change in two right lower lobe pulmonary metastases. No thoracic adenopathy or other findings of new or progressive metastatic disease in the chest. 2. No metastatic disease in the abdomen or pelvis. 3. Chronic findings include: Aortic Atherosclerosis (ICD10-I70.0). Cholelithiasis. Mild prostatomegaly. Minimal sigmoid diverticulosis. Electronically Signed   By: Ilona Sorrel M.D.   On: 10/02/2018 12:16    ASSESSMENT AND PLAN:  This is a very pleasant 73 years old white male with recurrent non-small cell lung cancer, adenocarcinoma and positive EGFR mutation in exon 21. He is currently on Tarceva 100 mg by mouth daily status post more than 50 months of treatment. The patient has been tolerating this treatment well with no concerning adverse effects. He had repeat CT scan of the chest, abdomen pelvis performed recently.  I personally and independently reviewed the scans and discussed the results with the patient today. His scan showed no concerning findings for disease progression. I recommended for the patient to continue on his current treatment with Tarceva with the same dose. I will see him back for follow-up visit in 2 months for evaluation and repeat blood work. He was advised to call immediately if he has any concerning symptoms in the interval. The patient voices understanding of current disease status and  treatment options and is in agreement with the current care plan. All questions were answered. The patient knows to call the clinic with any problems, questions or concerns. We can certainly see the patient much sooner if necessary.  Disclaimer: This note was dictated with voice recognition software. Similar sounding words can inadvertently be transcribed and may not be corrected upon review.

## 2018-10-04 NOTE — Telephone Encounter (Signed)
Scheduled appt per 7/2 los - pt is aware of appt date and time - gave patient AVS and calender

## 2018-10-22 ENCOUNTER — Other Ambulatory Visit: Payer: Self-pay | Admitting: Thoracic Surgery (Cardiothoracic Vascular Surgery)

## 2018-10-22 ENCOUNTER — Other Ambulatory Visit: Payer: Self-pay

## 2018-10-22 DIAGNOSIS — C3431 Malignant neoplasm of lower lobe, right bronchus or lung: Secondary | ICD-10-CM

## 2018-10-23 ENCOUNTER — Encounter: Payer: Self-pay | Admitting: Thoracic Surgery (Cardiothoracic Vascular Surgery)

## 2018-10-23 ENCOUNTER — Ambulatory Visit
Admission: RE | Admit: 2018-10-23 | Discharge: 2018-10-23 | Disposition: A | Discharge status: Home / Self Care | Payer: Medicare Other | Source: Ambulatory Visit | Attending: Thoracic Surgery (Cardiothoracic Vascular Surgery) | Admitting: Thoracic Surgery (Cardiothoracic Vascular Surgery)

## 2018-10-23 ENCOUNTER — Ambulatory Visit (INDEPENDENT_AMBULATORY_CARE_PROVIDER_SITE_OTHER): Payer: Medicare Other | Admitting: Thoracic Surgery (Cardiothoracic Vascular Surgery)

## 2018-10-23 VITALS — BP 133/83 | HR 63 | Temp 97.0°F | Resp 18 | Ht 73.0 in | Wt 232.0 lb

## 2018-10-23 DIAGNOSIS — C3431 Malignant neoplasm of lower lobe, right bronchus or lung: Secondary | ICD-10-CM

## 2018-10-23 DIAGNOSIS — C3491 Malignant neoplasm of unspecified part of right bronchus or lung: Secondary | ICD-10-CM | POA: Diagnosis not present

## 2018-10-23 NOTE — Progress Notes (Signed)
[address]       [place] [address]             [phone]     HPI: Mr. Pardon returns for a scheduled annual follow-up visit  John Williamsen is a 73 year old gentleman with a past medical history of hypertension, hyperlipidemia, sleep apnea, right upper lobectomy for a stage Ia adenocarcinoma in 2007, and recurrent lung cancer.  He is a lifelong non-smoker.  He developed a recurrence about 4 years ago.  Biopsies were positive for adenocarcinoma.  Molecular testing showed it was EGFR positive.  He has been on Tarceva since then.    He is followed by Dr. [name] on a regular basis.  I have been seeing him annually.  I last saw him in July 2019.  At that time there was a question on CT of new right lung nodule and possible progression.  A follow-up CT 8 weeks later showed no suspicious findings.  He feels well.  He is been exercising more and has been able to lose some weight.  He does get out of breath with activity.  That has not changed recently.  Past Medical History:  Diagnosis Date  . Cholelithiases 07/16/2015  . Complication of anesthesia 2007   quit breathing with bronchoscopy, had to spend night  . History of agent Orange exposure 44-45 yrs ago  . History of pneumonia  last 3-4 yrs ago   3 -4 different times  . Hypercholesterolemia   . Hypertension   . Hypothyroidism   . lung ca dx'd 2007   lung right  . Sinus disease    hx of  . Sleep apnea    uses cpap setting opf 12    Current Outpatient Medications  Medication Sig Dispense Refill  . amitriptyline (ELAVIL) 50 MG tablet Take 50 mg by mouth at bedtime. Reported on 06/18/2015    . aspirin 81 MG tablet Take 81 mg by mouth every morning.     . Cholecalciferol 2000 units CAPS Take 2,000 Units by mouth daily.     . clindamycin (CLEOCIN T) 1 % external solution APPLY TO AFFECTED AREA(S) TWO TIMES A DAY AS NEEDED FOR SKIN IRRITATION 30 mL 0  . diltiazem (CARDIZEM CD) 180 MG 24 hr capsule Take 1 capsule  (180 mg total) by mouth daily. 90 capsule 3  . erlotinib (TARCEVA) 100 MG tablet Take 1 tablet (100 mg total) by mouth daily. Take on an empty stomach 1 hour before meals or 2 hours after.faxed to [facility] 90 tablet 3  . levETIRAcetam (KEPPRA) 750 MG tablet Take 750 mg by mouth at bedtime.     . metoprolol succinate (TOPROL-XL) 50 MG 24 hr tablet Take 1 tablet (50 mg total) by mouth daily. Take with or immediately following a meal. 90 tablet 3  . Multiple Vitamins-Minerals (CENTRUM SILVER PO) Take 1 tablet by mouth daily.     . nitroGLYCERIN (NITROSTAT) 0.4 MG SL tablet Place 1 tablet (0.4 mg total) under the tongue every 5 (five) minutes as needed for chest pain. Reported on 10/22/2015 30 tablet 11  . polyethylene glycol (MIRALAX / GLYCOLAX) packet Take 17 g by mouth daily. Reported on 08/27/2015    . psyllium (METAMUCIL) 58.6 % powder Take 1 packet by mouth daily. Reported on 08/27/2015    . rosuvastatin (CRESTOR) 5 MG tablet Take 5 mg by mouth every morning.     [name] SYNTHROID 112 MCG tablet Take 112 mcg by mouth  daily.    . valsartan-hydrochlorothiazide (DIOVAN-HCT) 320-25 MG per tablet Take 1 tablet by mouth every morning.      No current facility-administered medications for this visit.     Physical Exam BP 133/83 (BP Location: Right Arm, Patient Position: Sitting, Cuff Size: Normal)   Pulse 63   Temp (!) 97 F (36.1 C)   Resp 18   Ht _0  (1.854 m)   Wt 232 lb (105.2 kg)   SpO2 97% Comment: RA  BMI 30.42 kg/m  73 year old man in no acute distress Alert and oriented x3 with no focal deficits No cervical or supraclavicular adenopathy Cardiac regular rate and rhythm normal S1 and S2 Lungs absent at right base, otherwise clear  Diagnostic Tests: CT CHEST FINDINGS  Cardiovascular: Normal heart size. No significant pericardial effusion/thickening. Mildly atherosclerotic nonaneurysmal thoracic aorta. Normal caliber pulmonary arteries. No central pulmonary emboli.   Mediastinum/Nodes: No discrete thyroid nodules. Unremarkable esophagus. No pathologically enlarged axillary, mediastinal or hilar lymph nodes.  Lungs/Pleura: No pneumothorax. No pleural effusion. Status post right upper lobectomy. Irregular predominantly solid anterior right lower lobe 2.5 x 1.4 cm pulmonary nodule (series 7/image 50), previously 2.5 x 1.4 cm using similar measurement technique, stable. Sub solid paramediastinal right lower lobe 1.5 x 1.1 cm nodule (series 7/image 34), previously 1.5 x 1.1 cm, stable. No acute consolidative airspace disease, lung masses or new significant pulmonary nodules.  Musculoskeletal:  No aggressive appearing focal osseous lesions.  CT ABDOMEN PELVIS FINDINGS  Hepatobiliary: Normal liver with no liver mass. Cholelithiasis. No biliary ductal dilatation.  Pancreas: Normal, with no mass or duct dilation.  Spleen: Normal size. No mass.  Adrenals/Urinary Tract: Normal adrenals. No hydronephrosis. Simple 2.0 cm exophytic anterior upper right renal cyst. No suspicious renal masses. Normal bladder.  Stomach/Bowel: Normal non-distended stomach. Normal caliber small bowel with no small bowel wall thickening. Normal appendix. Moderate colonic stool volume. Oral contrast transits to the large bowel. Minimal sigmoid diverticulosis, with no large bowel wall thickening or significant pericolonic fat stranding.  Vascular/Lymphatic: Mildly atherosclerotic nonaneurysmal abdominal aorta. Patent portal, splenic and renal veins. No pathologically enlarged lymph nodes in the abdomen or pelvis.  Reproductive: Mildly enlarged prostate.  Other: No pneumoperitoneum, ascites or focal fluid collection.  Musculoskeletal: No aggressive appearing focal osseous lesions. Marked lumbar spondylosis.  IMPRESSION: 1. No appreciable change in two right lower lobe pulmonary metastases. No thoracic adenopathy or other findings of new or progressive  metastatic disease in the chest. 2. No metastatic disease in the abdomen or pelvis. 3. Chronic findings include: Aortic Atherosclerosis (ICD10-I70.0). Cholelithiasis. Mild prostatomegaly. Minimal sigmoid diverticulosis.   Electronically Signed   By: Ilona Sorrel M.D.   On: 10/02/2018 12:16 CHEST - 2 VIEW  COMPARISON:  October 24, 2017.  FINDINGS: Stable chronic elevation of the right hemidiaphragm. Areas of scarring in the right upper and mid lung, stable. Postoperative changes on the right. Left lung clear. Heart is normal size. No acute bony abnormality.  IMPRESSION: Postoperative changes on the right with volume loss and elevation of the right hemidiaphragm along with scarring. No change since prior study.   Electronically Signed   By: Rolm Baptise M.D.   On: 10/23/2018 10:00 I personally reviewed the CT and chest x-ray images and concur with the findings noted above.  No new or suspicious findings.  Impression: Mr. Skorupski is a 74 year old gentleman who have known since 2007.  I did a right upper lobectomy for a stage Ia lung cancer.  He is a lifelong  non-smoker.  Back in 2016 he developed new right lung nodules.  Biopsies showed adenocarcinoma.  He was EGFR positive.  He has been on Tarceva for over 4 years now.  His disease is stable.  Plan: Follow-up with Dr. [name] as scheduled Return in 1 year with chest x-ray  Melrose Nakayama, MD [facility] [phone]

## 2018-11-19 DIAGNOSIS — E782 Mixed hyperlipidemia: Secondary | ICD-10-CM | POA: Diagnosis not present

## 2018-11-19 DIAGNOSIS — N400 Enlarged prostate without lower urinary tract symptoms: Secondary | ICD-10-CM | POA: Diagnosis not present

## 2018-11-19 DIAGNOSIS — I208 Other forms of angina pectoris: Secondary | ICD-10-CM | POA: Diagnosis not present

## 2018-11-19 DIAGNOSIS — D649 Anemia, unspecified: Secondary | ICD-10-CM | POA: Diagnosis not present

## 2018-11-19 DIAGNOSIS — E039 Hypothyroidism, unspecified: Secondary | ICD-10-CM | POA: Diagnosis not present

## 2018-11-19 DIAGNOSIS — C3490 Malignant neoplasm of unspecified part of unspecified bronchus or lung: Secondary | ICD-10-CM | POA: Diagnosis not present

## 2018-11-19 DIAGNOSIS — Z1389 Encounter for screening for other disorder: Secondary | ICD-10-CM | POA: Diagnosis not present

## 2018-11-19 DIAGNOSIS — Z0001 Encounter for general adult medical examination with abnormal findings: Secondary | ICD-10-CM | POA: Diagnosis not present

## 2018-11-19 DIAGNOSIS — E785 Hyperlipidemia, unspecified: Secondary | ICD-10-CM | POA: Diagnosis not present

## 2018-11-19 DIAGNOSIS — I25110 Atherosclerotic heart disease of native coronary artery with unstable angina pectoris: Secondary | ICD-10-CM | POA: Diagnosis not present

## 2018-11-19 DIAGNOSIS — Z86010 Personal history of colonic polyps: Secondary | ICD-10-CM | POA: Diagnosis not present

## 2018-11-19 DIAGNOSIS — I10 Essential (primary) hypertension: Secondary | ICD-10-CM | POA: Diagnosis not present

## 2018-11-20 DIAGNOSIS — E039 Hypothyroidism, unspecified: Secondary | ICD-10-CM | POA: Diagnosis not present

## 2018-11-20 DIAGNOSIS — N400 Enlarged prostate without lower urinary tract symptoms: Secondary | ICD-10-CM | POA: Diagnosis not present

## 2018-11-20 DIAGNOSIS — E782 Mixed hyperlipidemia: Secondary | ICD-10-CM | POA: Diagnosis not present

## 2018-11-20 DIAGNOSIS — I10 Essential (primary) hypertension: Secondary | ICD-10-CM | POA: Diagnosis not present

## 2018-11-20 DIAGNOSIS — E559 Vitamin D deficiency, unspecified: Secondary | ICD-10-CM | POA: Diagnosis not present

## 2018-11-29 ENCOUNTER — Telehealth: Payer: Self-pay | Admitting: Internal Medicine

## 2018-11-29 ENCOUNTER — Other Ambulatory Visit: Payer: Self-pay

## 2018-11-29 ENCOUNTER — Inpatient Hospital Stay: Payer: Medicare Other

## 2018-11-29 ENCOUNTER — Inpatient Hospital Stay: Payer: Medicare Other | Attending: Internal Medicine | Admitting: Internal Medicine

## 2018-11-29 ENCOUNTER — Encounter: Payer: Self-pay | Admitting: Internal Medicine

## 2018-11-29 VITALS — BP 130/81 | HR 62 | Temp 98.3°F | Resp 18 | Ht 73.0 in | Wt 229.8 lb

## 2018-11-29 DIAGNOSIS — Z79899 Other long term (current) drug therapy: Secondary | ICD-10-CM | POA: Diagnosis not present

## 2018-11-29 DIAGNOSIS — Z5111 Encounter for antineoplastic chemotherapy: Secondary | ICD-10-CM

## 2018-11-29 DIAGNOSIS — C3431 Malignant neoplasm of lower lobe, right bronchus or lung: Secondary | ICD-10-CM

## 2018-11-29 DIAGNOSIS — I10 Essential (primary) hypertension: Secondary | ICD-10-CM | POA: Insufficient documentation

## 2018-11-29 DIAGNOSIS — E039 Hypothyroidism, unspecified: Secondary | ICD-10-CM | POA: Insufficient documentation

## 2018-11-29 DIAGNOSIS — Z7982 Long term (current) use of aspirin: Secondary | ICD-10-CM | POA: Diagnosis not present

## 2018-11-29 DIAGNOSIS — E7800 Pure hypercholesterolemia, unspecified: Secondary | ICD-10-CM | POA: Insufficient documentation

## 2018-11-29 DIAGNOSIS — G4730 Sleep apnea, unspecified: Secondary | ICD-10-CM | POA: Insufficient documentation

## 2018-11-29 DIAGNOSIS — C3411 Malignant neoplasm of upper lobe, right bronchus or lung: Secondary | ICD-10-CM | POA: Diagnosis not present

## 2018-11-29 DIAGNOSIS — C3490 Malignant neoplasm of unspecified part of unspecified bronchus or lung: Secondary | ICD-10-CM

## 2018-11-29 LAB — CBC WITH DIFFERENTIAL (CANCER CENTER ONLY)
Abs Immature Granulocytes: 0.02 10*3/uL (ref 0.00–0.07)
Basophils Absolute: 0 10*3/uL (ref 0.0–0.1)
Basophils Relative: 1 %
Eosinophils Absolute: 0.3 10*3/uL (ref 0.0–0.5)
Eosinophils Relative: 5 %
HCT: 39.9 % (ref 39.0–52.0)
Hemoglobin: 13.8 g/dL (ref 13.0–17.0)
Immature Granulocytes: 0 %
Lymphocytes Relative: 42 %
Lymphs Abs: 2.5 10*3/uL (ref 0.7–4.0)
MCH: 34.8 pg — ABNORMAL HIGH (ref 26.0–34.0)
MCHC: 34.6 g/dL (ref 30.0–36.0)
MCV: 100.5 fL — ABNORMAL HIGH (ref 80.0–100.0)
Monocytes Absolute: 0.6 10*3/uL (ref 0.1–1.0)
Monocytes Relative: 10 %
Neutro Abs: 2.5 10*3/uL (ref 1.7–7.7)
Neutrophils Relative %: 42 %
Platelet Count: 181 10*3/uL (ref 150–400)
RBC: 3.97 MIL/uL — ABNORMAL LOW (ref 4.22–5.81)
RDW: 12.1 % (ref 11.5–15.5)
WBC Count: 5.9 10*3/uL (ref 4.0–10.5)
nRBC: 0 % (ref 0.0–0.2)

## 2018-11-29 LAB — CMP (CANCER CENTER ONLY)
ALT: 28 U/L (ref 0–44)
AST: 28 U/L (ref 15–41)
Albumin: 3.9 g/dL (ref 3.5–5.0)
Alkaline Phosphatase: 57 U/L (ref 38–126)
Anion gap: 8 (ref 5–15)
BUN: 13 mg/dL (ref 8–23)
CO2: 28 mmol/L (ref 22–32)
Calcium: 9.1 mg/dL (ref 8.9–10.3)
Chloride: 105 mmol/L (ref 98–111)
Creatinine: 1.02 mg/dL (ref 0.61–1.24)
GFR, Est AFR Am: >60 mL/min (ref >60)
GFR, Estimated: >60 mL/min (ref >60)
Glucose, Bld: 102 mg/dL — ABNORMAL HIGH (ref 70–99)
Potassium: 3.5 mmol/L (ref 3.5–5.1)
Sodium: 141 mmol/L (ref 135–145)
Total Bilirubin: 0.8 mg/dL (ref 0.3–1.2)
Total Protein: 6.4 g/dL — ABNORMAL LOW (ref 6.5–8.1)

## 2018-11-29 NOTE — Progress Notes (Signed)
[facility] [phone]   [phone]  OFFICE PROGRESS NOTE  [name], MD 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  PRIOR THERAPY: 1) Status post right upper lobectomy under the care of Dr. Roxan Hockey on 01/23/2006.  2) Tarceva 150 mg by mouth daily as part of the BMS checkmate 370 clinical trial. Status post 6 weeks of treatment.  CURRENT THERAPY: Tarceva 100 mg by mouth daily as part of the BMS Checkmate 370 clinical trial. First dose started 07/17/2014 status post 52 months of treatment.  INTERVAL HISTORY: [name] 73 y.o. male returns to the clinic today for follow-up visit.  The patient has no complaints today except for abdominal bloating and gas production.  He denied having any current chest pain, shortness breath, cough or hemoptysis.  He denied having any nausea, vomiting, diarrhea or constipation.  He has no headache or visual changes.  He denied having any recent weight loss or night sweats.  He has no fever or chills.  He has been tolerating his treatment with Tarceva fairly well.  The patient is here today for evaluation and repeat blood work.   MEDICAL HISTORY: Past Medical History:  Diagnosis Date  . Cholelithiases 07/16/2015  . Complication of anesthesia 2007   quit breathing with bronchoscopy, had to spend night  . History of agent Orange exposure 44-45 yrs ago  . History of pneumonia  last 3-4 yrs ago   3 -4 different times  . Hypercholesterolemia   . Hypertension   . Hypothyroidism   . lung ca dx'd 2007   lung right  . Sinus disease    hx of  . Sleep apnea    uses cpap setting opf 12    ALLERGIES:  is allergic to lisinopril and prednisone.  MEDICATIONS:  Current Outpatient Medications  Medication Sig Dispense Refill   . amitriptyline (ELAVIL) 50 MG tablet Take 50 mg by mouth at bedtime. Reported on 06/18/2015    . aspirin 81 MG tablet Take 81 mg by mouth every morning.     . Cholecalciferol 2000 units CAPS Take 2,000 Units by mouth daily.     . clindamycin (CLEOCIN T) 1 % external solution APPLY TO AFFECTED AREA(S) TWO TIMES A DAY AS NEEDED FOR SKIN IRRITATION 30 mL 0  . diltiazem (CARDIZEM CD) 180 MG 24 hr capsule Take 1 capsule (180 mg total) by mouth daily. 90 capsule 3  . erlotinib (TARCEVA) 100 MG tablet Take 1 tablet (100 mg total) by mouth daily. Take on an empty stomach 1 hour before meals or 2 hours after.faxed to [facility] 90 tablet 3  . levETIRAcetam (KEPPRA) 750 MG tablet Take 750 mg by mouth at bedtime.     . metoprolol succinate (TOPROL-XL) 50 MG 24 hr tablet Take 1 tablet (50 mg total) by mouth daily. Take with or immediately following a meal. 90 tablet 3  . Multiple Vitamins-Minerals (CENTRUM SILVER PO) Take 1 tablet by mouth daily.     . nitroGLYCERIN (NITROSTAT) 0.4 MG SL tablet Place 1 tablet (0.4 mg total) under the tongue every 5 (five) minutes as needed for chest pain. Reported on 10/22/2015 30 tablet 11  . polyethylene glycol (MIRALAX / GLYCOLAX) packet Take 17 g by mouth daily. Reported on 08/27/2015    . psyllium (METAMUCIL)  58.6 % powder Take 1 packet by mouth daily. Reported on 08/27/2015    . rosuvastatin (CRESTOR) 5 MG tablet Take 5 mg by mouth every morning.     [name] SYNTHROID 112 MCG tablet Take 112 mcg by mouth daily.    . valsartan-hydrochlorothiazide (DIOVAN-HCT) 320-25 MG per tablet Take 1 tablet by mouth every morning.      No current facility-administered medications for this visit.     SURGICAL HISTORY:  Past Surgical History:  Procedure Laterality Date  . BIOPSY  05/21/2018   Procedure: BIOPSY;  Surgeon: Wonda Horner, MD;  Location: [facility];  Service: Endoscopy;;  . COLONOSCOPY WITH PROPOFOL N/A 03/19/2013   Procedure: COLONOSCOPY WITH PROPOFOL;  Surgeon: Garlan Fair, MD;  Location: [facility];  Service: Endoscopy;  Laterality: N/A;  . Cystourethroscopy, [facility] TURP.  04/16/2010  . ESOPHAGOGASTRODUODENOSCOPY (EGD) WITH PROPOFOL N/A 05/21/2018   Procedure: ESOPHAGOGASTRODUODENOSCOPY (EGD) WITH PROPOFOL;  Surgeon: Wonda Horner, MD;  Location: [facility];  Service: Endoscopy;  Laterality: N/A;  . NASAL SINUS SURGERY  1987  . Right video-assisted thoracoscopy, right upper lobectomy and  01/23/2006  . The Olympus video bronchoscope was introduced via the right  12/21/2005  . TONSILLECTOMY  1957  . Torn medial and lateral menisci, left knee.  11/06/2006    REVIEW OF SYSTEMS:  A comprehensive review of systems was negative.   PHYSICAL EXAMINATION: General appearance: alert, cooperative and no distress Head: Normocephalic, without obvious abnormality, atraumatic Neck: no adenopathy, no JVD, supple, symmetrical, trachea midline and thyroid not enlarged, symmetric, no tenderness/mass/nodules Lymph nodes: Cervical, supraclavicular, and axillary nodes normal. Resp: clear to auscultation bilaterally Back: symmetric, no curvature. ROM normal. No CVA tenderness. Cardio: regular rate and rhythm, S1, S2 normal, no murmur, click, rub or gallop GI: soft, non-tender; bowel sounds normal; no masses,  no organomegaly Extremities: extremities normal, atraumatic, no cyanosis or edema  ECOG PERFORMANCE STATUS: 1 - Symptomatic but completely ambulatory  Blood pressure 130/81, pulse 62, temperature 98.3 F (36.8 C), temperature source Oral, resp. rate 18, height _0  (1.854 m), weight 229 lb 12.8 oz (104.2 kg), SpO2 100 %.  LABORATORY DATA: Lab Results  Component Value Date   WBC 5.9 11/29/2018   HGB 13.8 11/29/2018   HCT 39.9 11/29/2018   MCV 100.5 (H) 11/29/2018   PLT 181 11/29/2018      Chemistry      Component Value Date/Time   NA 140 10/01/2018 1003   NA 139 04/06/2017 0932   K 3.6 10/01/2018 1003   K 3.8 04/06/2017 0932   CL 103 10/01/2018  1003   CL 102 04/24/2012 0853   CO2 29 10/01/2018 1003   CO2 29 04/06/2017 0932   BUN 13 10/01/2018 1003   BUN 14.4 04/06/2017 0932   CREATININE 1.05 10/01/2018 1003   CREATININE 1.0 04/06/2017 0932      Component Value Date/Time   CALCIUM 9.5 10/01/2018 1003   CALCIUM 9.5 04/06/2017 0932   ALKPHOS 59 10/01/2018 1003   ALKPHOS 58 04/06/2017 0932   AST 27 10/01/2018 1003   AST 27 04/06/2017 0932   ALT 28 10/01/2018 1003   ALT 27 04/06/2017 0932   BILITOT 0.7 10/01/2018 1003   BILITOT 0.45 04/06/2017 0932       RADIOGRAPHIC STUDIES: No results found.  ASSESSMENT AND PLAN:  This is a very pleasant 73 years old white male with recurrent non-small cell lung cancer, adenocarcinoma and positive EGFR mutation in exon 21. He is currently  on Tarceva 100 mg by mouth daily status post more than 52 months of treatment. The patient has been tolerating the treatment well with no concerning adverse effects. I recommended for him to continue his current treatment with Tarceva with the same dose. I will see him back for follow-up visit in 2 months for evaluation with repeat CT scan of the chest, abdomen and pelvis for restaging of his disease. The patient was advised to call immediately if he has any concerning symptoms in the interval. The patient voices understanding of current disease status and treatment options and is in agreement with the current care plan. All questions were answered. The patient knows to call the clinic with any problems, questions or concerns. We can certainly see the patient much sooner if necessary.  Disclaimer: This note was dictated with voice recognition software. Similar sounding words can inadvertently be transcribed and may not be corrected upon review.

## 2018-11-29 NOTE — Telephone Encounter (Signed)
Scheduled per 08/27 los, patient received avs and calender.

## 2018-12-14 ENCOUNTER — Telehealth: Payer: Self-pay | Admitting: Medical Oncology

## 2018-12-14 DIAGNOSIS — R6889 Other general symptoms and signs: Secondary | ICD-10-CM | POA: Diagnosis not present

## 2018-12-14 DIAGNOSIS — K219 Gastro-esophageal reflux disease without esophagitis: Secondary | ICD-10-CM | POA: Diagnosis not present

## 2018-12-14 NOTE — Telephone Encounter (Signed)
Can he take Protonix?. Per Pharmacy information the

## 2018-12-14 NOTE — Telephone Encounter (Addendum)
Dr "Towanda Malkin"  wants to prescribe protonix BID for his long standing acid reflux. He takes his Tarceva at 7 am daily. I told him the protonix  needs to be spaced out 2 hours before Tarceva  and 10 hours after Tarceva .

## 2018-12-17 ENCOUNTER — Other Ambulatory Visit: Payer: Self-pay | Admitting: Medical Oncology

## 2018-12-17 DIAGNOSIS — C3490 Malignant neoplasm of unspecified part of unspecified bronchus or lung: Secondary | ICD-10-CM

## 2018-12-17 DIAGNOSIS — K219 Gastro-esophageal reflux disease without esophagitis: Secondary | ICD-10-CM

## 2018-12-17 MED ORDER — FAMOTIDINE 20 MG PO TABS: 20.0000 mg | ORAL_TABLET | Freq: Two times a day (BID) | ORAL | 0 refills | Status: DC

## 2018-12-17 NOTE — Telephone Encounter (Signed)
[name] said he will try pepcid 20 mg BID , but does not think it will be strong enough. Message cc to Dr Janace Hoard.

## 2018-12-17 NOTE — Telephone Encounter (Signed)
Per Dr [name] he does not want pt to take Protonix PPI- I lvm for pt that the can take Pepcid BID

## 2019-01-04 ENCOUNTER — Other Ambulatory Visit: Payer: Self-pay | Admitting: Interventional Cardiology

## 2019-01-04 DIAGNOSIS — C3490 Malignant neoplasm of unspecified part of unspecified bronchus or lung: Secondary | ICD-10-CM | POA: Diagnosis not present

## 2019-01-04 DIAGNOSIS — E782 Mixed hyperlipidemia: Secondary | ICD-10-CM | POA: Diagnosis not present

## 2019-01-04 DIAGNOSIS — N400 Enlarged prostate without lower urinary tract symptoms: Secondary | ICD-10-CM | POA: Diagnosis not present

## 2019-01-04 DIAGNOSIS — I25110 Atherosclerotic heart disease of native coronary artery with unstable angina pectoris: Secondary | ICD-10-CM | POA: Diagnosis not present

## 2019-01-04 DIAGNOSIS — E039 Hypothyroidism, unspecified: Secondary | ICD-10-CM | POA: Diagnosis not present

## 2019-01-04 DIAGNOSIS — I208 Other forms of angina pectoris: Secondary | ICD-10-CM | POA: Diagnosis not present

## 2019-01-04 DIAGNOSIS — E785 Hyperlipidemia, unspecified: Secondary | ICD-10-CM | POA: Diagnosis not present

## 2019-01-04 DIAGNOSIS — D649 Anemia, unspecified: Secondary | ICD-10-CM | POA: Diagnosis not present

## 2019-01-04 DIAGNOSIS — I10 Essential (primary) hypertension: Secondary | ICD-10-CM | POA: Diagnosis not present

## 2019-01-28 ENCOUNTER — Other Ambulatory Visit: Payer: Self-pay

## 2019-01-28 ENCOUNTER — Inpatient Hospital Stay: Payer: Medicare Other | Attending: Internal Medicine

## 2019-01-28 DIAGNOSIS — C3431 Malignant neoplasm of lower lobe, right bronchus or lung: Secondary | ICD-10-CM | POA: Diagnosis not present

## 2019-01-28 DIAGNOSIS — E039 Hypothyroidism, unspecified: Secondary | ICD-10-CM | POA: Diagnosis not present

## 2019-01-28 DIAGNOSIS — E7800 Pure hypercholesterolemia, unspecified: Secondary | ICD-10-CM | POA: Insufficient documentation

## 2019-01-28 DIAGNOSIS — Z79899 Other long term (current) drug therapy: Secondary | ICD-10-CM | POA: Diagnosis not present

## 2019-01-28 DIAGNOSIS — Z7982 Long term (current) use of aspirin: Secondary | ICD-10-CM | POA: Insufficient documentation

## 2019-01-28 DIAGNOSIS — K219 Gastro-esophageal reflux disease without esophagitis: Secondary | ICD-10-CM | POA: Insufficient documentation

## 2019-01-28 DIAGNOSIS — G4730 Sleep apnea, unspecified: Secondary | ICD-10-CM | POA: Diagnosis not present

## 2019-01-28 DIAGNOSIS — I10 Essential (primary) hypertension: Secondary | ICD-10-CM | POA: Diagnosis not present

## 2019-01-28 DIAGNOSIS — Z23 Encounter for immunization: Secondary | ICD-10-CM | POA: Insufficient documentation

## 2019-01-28 DIAGNOSIS — C3490 Malignant neoplasm of unspecified part of unspecified bronchus or lung: Secondary | ICD-10-CM

## 2019-01-28 LAB — CBC WITH DIFFERENTIAL (CANCER CENTER ONLY)
Abs Immature Granulocytes: 0.01 10*3/uL (ref 0.00–0.07)
Basophils Absolute: 0 10*3/uL (ref 0.0–0.1)
Basophils Relative: 1 %
Eosinophils Absolute: 0.4 10*3/uL (ref 0.0–0.5)
Eosinophils Relative: 6 %
HCT: 40.7 % (ref 39.0–52.0)
Hemoglobin: 13.8 g/dL (ref 13.0–17.0)
Immature Granulocytes: 0 %
Lymphocytes Relative: 39 %
Lymphs Abs: 2.6 10*3/uL (ref 0.7–4.0)
MCH: 34.2 pg — ABNORMAL HIGH (ref 26.0–34.0)
MCHC: 33.9 g/dL (ref 30.0–36.0)
MCV: 100.7 fL — ABNORMAL HIGH (ref 80.0–100.0)
Monocytes Absolute: 0.8 10*3/uL (ref 0.1–1.0)
Monocytes Relative: 12 %
Neutro Abs: 2.8 10*3/uL (ref 1.7–7.7)
Neutrophils Relative %: 42 %
Platelet Count: 193 10*3/uL (ref 150–400)
RBC: 4.04 MIL/uL — ABNORMAL LOW (ref 4.22–5.81)
RDW: 12 % (ref 11.5–15.5)
WBC Count: 6.6 10*3/uL (ref 4.0–10.5)
nRBC: 0 % (ref 0.0–0.2)

## 2019-01-28 LAB — CMP (CANCER CENTER ONLY)
ALT: 26 U/L (ref 0–44)
AST: 25 U/L (ref 15–41)
Albumin: 4 g/dL (ref 3.5–5.0)
Alkaline Phosphatase: 56 U/L (ref 38–126)
Anion gap: 9 (ref 5–15)
BUN: 15 mg/dL (ref 8–23)
CO2: 29 mmol/L (ref 22–32)
Calcium: 9.4 mg/dL (ref 8.9–10.3)
Chloride: 104 mmol/L (ref 98–111)
Creatinine: 1.16 mg/dL (ref 0.61–1.24)
GFR, Est AFR Am: >60 mL/min (ref >60)
GFR, Estimated: >60 mL/min (ref >60)
Glucose, Bld: 94 mg/dL (ref 70–99)
Potassium: 3.7 mmol/L (ref 3.5–5.1)
Sodium: 142 mmol/L (ref 135–145)
Total Bilirubin: 0.5 mg/dL (ref 0.3–1.2)
Total Protein: 6.6 g/dL (ref 6.5–8.1)

## 2019-01-29 ENCOUNTER — Encounter (HOSPITAL_COMMUNITY): Payer: Self-pay

## 2019-01-29 ENCOUNTER — Ambulatory Visit (HOSPITAL_COMMUNITY)
Admission: RE | Admit: 2019-01-29 | Discharge: 2019-01-29 | Disposition: A | Discharge status: Home / Self Care | Payer: Medicare Other | Source: Ambulatory Visit | Attending: Internal Medicine | Admitting: Internal Medicine

## 2019-01-29 ENCOUNTER — Other Ambulatory Visit: Payer: Medicare Other

## 2019-01-29 DIAGNOSIS — C3490 Malignant neoplasm of unspecified part of unspecified bronchus or lung: Secondary | ICD-10-CM | POA: Diagnosis not present

## 2019-01-29 DIAGNOSIS — K8020 Calculus of gallbladder without cholecystitis without obstruction: Secondary | ICD-10-CM | POA: Diagnosis not present

## 2019-01-29 MED ORDER — SODIUM CHLORIDE (PF) 0.9 % IJ SOLN
INTRAMUSCULAR | Status: AC
  Filled 2019-01-29: qty 50

## 2019-01-29 MED ORDER — IOHEXOL 300 MG/ML  SOLN
100.0000 mL | Freq: Once | INTRAMUSCULAR | Status: AC | PRN
  Administered 2019-01-29: 100 mL via INTRAVENOUS

## 2019-01-30 DIAGNOSIS — C3490 Malignant neoplasm of unspecified part of unspecified bronchus or lung: Secondary | ICD-10-CM | POA: Diagnosis not present

## 2019-01-30 DIAGNOSIS — K5900 Constipation, unspecified: Secondary | ICD-10-CM | POA: Diagnosis not present

## 2019-01-30 DIAGNOSIS — A048 Other specified bacterial intestinal infections: Secondary | ICD-10-CM | POA: Diagnosis not present

## 2019-01-30 DIAGNOSIS — Z86010 Personal history of colonic polyps: Secondary | ICD-10-CM | POA: Diagnosis not present

## 2019-01-30 DIAGNOSIS — K219 Gastro-esophageal reflux disease without esophagitis: Secondary | ICD-10-CM | POA: Diagnosis not present

## 2019-01-30 NOTE — Progress Notes (Signed)
Cardiology Office Note:    Date:  02/01/2019   ID:  [name], DOB January 24, 1946, MRN [id number]  PCP:  [name], MD  Cardiologist:  Sinclair Grooms, MD   Referring MD: [name], MD   Chief Complaint  Patient presents with  . Coronary Artery Disease    Exertional angina while walking up inclines and    History of Present Illness:    [name] is a 73 y.o. male with a hx of non-small cell lung cancer (adenocarcinoma) 2007, right upper lobectomy 2007, Sleep apnea, essential hypertension, hyperlipidemia, now being evaluated for exertional chest pain.  Over the last 6 months exertional dyspnea has gotten worse and exertional chest tightness occurs more quickly.  He still walks 2 miles per day.  Walking up an incline is what precipitates the discomfort.  Rest relieves the discomfort.  Past Medical History:  Diagnosis Date  . Cholelithiases 07/16/2015  . Complication of anesthesia 2007   quit breathing with bronchoscopy, had to spend night  . History of agent Orange exposure 44-45 yrs ago  . History of pneumonia  last 3-4 yrs ago   3 -4 different times  . Hypercholesterolemia   . Hypertension   . Hypothyroidism   . lung ca dx'd 2007   lung right  . Sinus disease    hx of  . Sleep apnea    uses cpap setting opf 12    Past Surgical History:  Procedure Laterality Date  . BIOPSY  05/21/2018   Procedure: BIOPSY;  Surgeon: Wonda Horner, MD;  Location: [facility];  Service: Endoscopy;;  . COLONOSCOPY WITH PROPOFOL N/A 03/19/2013   Procedure: COLONOSCOPY WITH PROPOFOL;  Surgeon: Garlan Fair, MD;  Location: [facility];  Service: Endoscopy;  Laterality: N/A;  . Cystourethroscopy, [facility] TURP.  04/16/2010  . ESOPHAGOGASTRODUODENOSCOPY (EGD) WITH PROPOFOL N/A 05/21/2018   Procedure: ESOPHAGOGASTRODUODENOSCOPY (EGD) WITH PROPOFOL;  Surgeon: Wonda Horner, MD;  Location: [facility];  Service: Endoscopy;  Laterality: N/A;  . NASAL SINUS SURGERY  1987  .  Right video-assisted thoracoscopy, right upper lobectomy and  01/23/2006  . The Olympus video bronchoscope was introduced via the right  12/21/2005  . TONSILLECTOMY  1957  . Torn medial and lateral menisci, left knee.  11/06/2006    Current Medications: Current Meds  Medication Sig  . amitriptyline (ELAVIL) 50 MG tablet Take 50 mg by mouth at bedtime. Reported on 06/18/2015  . aspirin 81 MG tablet Take 81 mg by mouth every morning.   . Cholecalciferol 2000 units CAPS Take 2,000 Units by mouth daily.   . clindamycin (CLEOCIN T) 1 % external solution APPLY TO AFFECTED AREA(S) TWO TIMES A DAY AS NEEDED FOR SKIN IRRITATION  . diltiazem (TIAZAC) 180 MG 24 hr capsule TAKE ONE CAPSULE BY MOUTH DAILY  . erlotinib (TARCEVA) 100 MG tablet Take 1 tablet (100 mg total) by mouth daily. Take on an empty stomach 1 hour before meals or 2 hours after.faxed to [facility]  . famotidine (PEPCID) 20 MG tablet Take 1 tablet (20 mg total) by mouth 2 (two) times daily.  [name] levETIRAcetam (KEPPRA) 750 MG tablet Take 750 mg by mouth at bedtime.   . metoprolol succinate (TOPROL-XL) 50 MG 24 hr tablet Take 1 tablet (50 mg total) by mouth daily. Take with or immediately following a meal.  . Multiple Vitamins-Minerals (CENTRUM SILVER PO) Take 1 tablet by mouth daily.   . nitroGLYCERIN (NITROSTAT) 0.4 MG SL tablet Place 1 tablet (0.4  mg total) under the tongue every 5 (five) minutes as needed for chest pain. Reported on 10/22/2015  . polyethylene glycol (MIRALAX / GLYCOLAX) packet Take 17 g by mouth daily. Reported on 08/27/2015  . psyllium (METAMUCIL) 58.6 % powder Take 1 packet by mouth daily. Reported on 08/27/2015  . rosuvastatin (CRESTOR) 5 MG tablet Take 5 mg by mouth every morning.   [name] SYNTHROID 112 MCG tablet Take 112 mcg by mouth daily.  . valsartan-hydrochlorothiazide (DIOVAN-HCT) 320-25 MG per tablet Take 1 tablet by mouth every morning.      Allergies:   Lisinopril and Prednisone   Social History    Socioeconomic History  . Marital status: Married    Spouse name: Not on file  . Number of children: 0  . Years of education: Not on file  . Highest education level: Not on file  Occupational History  . 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 Financial resource strain: Not on file  . Food insecurity    Worry: Not on file    Inability: Not on file  . Transportation needs    Medical: Not on file    Non-medical: Not on file  Tobacco Use  . Smoking status: Never Smoker  . Smokeless tobacco: Never Used  Substance and Sexual Activity  . Alcohol use: No    Alcohol/week: 0.0 standard drinks  . Drug use: No  . Sexual activity: Not Currently  Lifestyle  . Physical activity    Days per week: Not on file    Minutes per session: Not on file  . Stress: Not on file  Relationships  . Social Herbalist on phone: Not on file    Gets together: Not on file    Attends religious service: Not on file    Active member of club or organization: Not on file    Attends meetings of clubs or organizations: Not on file    Relationship status: Not on file  Other Topics Concern  . Not on file  Social History Narrative  . Not on file     Family History: The patient's family history includes Heart attack in his brother and father; Hypertension in his mother.  ROS:   Please see the history of present illness.    Shortness of breath he feels could be pulmonary related.  No medication side effects.  He denies orthopnea.  No lower extremity swelling.  All other systems reviewed and are negative.  EKGs/Labs/Other Studies Reviewed:    The following studies were reviewed today: NUCLEAR STRESS TEST 2019{ Study Highlights   Nuclear stress EF: 60%.  The left ventricular ejection fraction is normal (55-65%).  There was no ST segment deviation noted during stress.  The study is normal.  This is a low risk study. No evidence of ischemia identified on perfusion  imaging.     EKG:  EKG not repeated  Recent Labs: 01/28/2019: ALT 26; BUN 15; Creatinine 1.16; Hemoglobin 13.8; Platelet Count 193; Potassium 3.7; Sodium 142  Recent Lipid Panel No results found for: CHOL, TRIG, HDL, CHOLHDL, VLDL, LDLCALC, LDLDIRECT  Physical Exam:    VS:  BP 132/76   Pulse (!) 50   Ht 6' (1.829 m)   Wt 231 lb 12.8 oz (105.1 kg)   SpO2 98%   BMI 31.44 kg/m     Wt Readings from Last 3 Encounters:  02/01/19 231 lb 12.8 oz (105.1 kg)  01/31/19 227 lb 8 oz (  103.2 kg)  11/29/18 229 lb 12.8 oz (104.2 kg)     GEN: Healthy-appearing. No acute distress HEENT: Normal NECK: No JVD. LYMPHATICS: No lymphadenopathy CARDIAC:  RRR without murmur, gallop, or edema. VASCULAR:  Normal Pulses. No bruits. RESPIRATORY:  Clear to auscultation without rales, wheezing or rhonchi  ABDOMEN: Soft, non-tender, non-distended, No pulsatile mass, MUSCULOSKELETAL: No deformity  SKIN: Warm and dry NEUROLOGIC:  Alert and oriented x 3 PSYCHIATRIC:  Normal affect   ASSESSMENT:    1. Angina pectoris ([name])   2. Essential hypertension   3. Sleep apnea, unspecified type   4. Hyperlipidemia LDL goal <70   5. Educated about COVID-19 virus infection   6. Precordial pain    PLAN:    In order of problems listed above:  1. Progressive compared to the last office visit.  Now starting to interfere somewhat with his routine exercise protocol.  Further diagnostic information is needed and therefore he will have a coronary CT with morphology and FFR if indicated for definitive anatomic assessment. 2. Target blood pressure 130/80 and stable 3. He wears CPAP 4. LDL target is being achieved with August value of 56.  He is on low intensity statin therapy.  This may need to be altered. 5. Social distancing, mask wearing, and handwashing is advocated and endorsed by the patient.  Overall education and awareness concerning primary risk prevention was discussed in detail: LDL less than 70,  hemoglobin A1c less than 7, blood pressure target less than 130/80 mmHg, >150 minutes of moderate aerobic activity per week, avoidance of smoking, weight control (via diet and exercise), and continued surveillance/management of/for obstructive sleep apnea.    Medication Adjustments/Labs and Tests Ordered: Current medicines are reviewed at length with the patient today.  Concerns regarding medicines are outlined above.  No orders of the defined types were placed in this encounter.  No orders of the defined types were placed in this encounter.   There are no Patient Instructions on file for this visit.   Signed, Sinclair Grooms, MD  02/01/2019 4:24 PM    [place]

## 2019-01-31 ENCOUNTER — Other Ambulatory Visit: Payer: Self-pay

## 2019-01-31 ENCOUNTER — Telehealth: Payer: Self-pay | Admitting: Internal Medicine

## 2019-01-31 ENCOUNTER — Encounter: Payer: Self-pay | Admitting: Internal Medicine

## 2019-01-31 ENCOUNTER — Other Ambulatory Visit: Payer: Self-pay | Admitting: Medical Oncology

## 2019-01-31 ENCOUNTER — Inpatient Hospital Stay (HOSPITAL_BASED_OUTPATIENT_CLINIC_OR_DEPARTMENT_OTHER): Payer: Medicare Other | Admitting: Internal Medicine

## 2019-01-31 VITALS — BP 136/82 | HR 74 | Temp 98.2°F | Resp 18 | Ht 73.0 in | Wt 227.5 lb

## 2019-01-31 DIAGNOSIS — I209 Angina pectoris, unspecified: Secondary | ICD-10-CM

## 2019-01-31 DIAGNOSIS — Z5111 Encounter for antineoplastic chemotherapy: Secondary | ICD-10-CM | POA: Diagnosis not present

## 2019-01-31 DIAGNOSIS — G4730 Sleep apnea, unspecified: Secondary | ICD-10-CM | POA: Diagnosis not present

## 2019-01-31 DIAGNOSIS — Z23 Encounter for immunization: Secondary | ICD-10-CM

## 2019-01-31 DIAGNOSIS — E7800 Pure hypercholesterolemia, unspecified: Secondary | ICD-10-CM | POA: Diagnosis not present

## 2019-01-31 DIAGNOSIS — I10 Essential (primary) hypertension: Secondary | ICD-10-CM

## 2019-01-31 DIAGNOSIS — E039 Hypothyroidism, unspecified: Secondary | ICD-10-CM | POA: Diagnosis not present

## 2019-01-31 DIAGNOSIS — C3431 Malignant neoplasm of lower lobe, right bronchus or lung: Secondary | ICD-10-CM | POA: Diagnosis not present

## 2019-01-31 DIAGNOSIS — K219 Gastro-esophageal reflux disease without esophagitis: Secondary | ICD-10-CM | POA: Diagnosis not present

## 2019-01-31 MED ORDER — INFLUENZA VAC A&B SA ADJ QUAD 0.5 ML IM PRSY
0.5000 mL | PREFILLED_SYRINGE | Freq: Once | INTRAMUSCULAR | Status: AC
  Administered 2019-01-31: 0.5 mL via INTRAMUSCULAR

## 2019-01-31 MED ORDER — INFLUENZA VAC A&B SA ADJ QUAD 0.5 ML IM PRSY
PREFILLED_SYRINGE | INTRAMUSCULAR | Status: AC
  Filled 2019-01-31: qty 0.5

## 2019-01-31 NOTE — Progress Notes (Signed)
[place] [phone]   [phone]  OFFICE PROGRESS NOTE  [name], MD 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  PRIOR THERAPY: 1) Status post right upper lobectomy under the care of Dr. Roxan Hockey on 01/23/2006.  2) Tarceva 150 mg by mouth daily as part of the BMS checkmate 370 clinical trial. Status post 6 weeks of treatment.  CURRENT THERAPY: Tarceva 100 mg by mouth daily as part of the BMS Checkmate 370 clinical trial. First dose started 07/17/2014 status post 54 months of treatment.  INTERVAL HISTORY: [name] 73 y.o. male returns to the clinic today for follow-up visit.  The patient is feeling fine today with no concerning complaints except for mild fatigue.  He denied having any chest pain, shortness of breath, cough or hemoptysis.  He denied having any fever or chills.  He has no nausea, vomiting, diarrhea or constipation.  He denied having any headache or visual changes.  He has no recent weight loss or night sweats.  He continues to tolerate his treatment with Tarceva fairly well.  The patient is here today for evaluation after repeating CT scan of the chest, abdomen and pelvis for restaging of his disease.  MEDICAL HISTORY: Past Medical History:  Diagnosis Date   Cholelithiases 6/80/8811   Complication of anesthesia 2007   quit breathing with bronchoscopy, had to spend night   History of agent Orange exposure 44-45 yrs ago   History of pneumonia  last 3-4 yrs ago   3 -4 different times   Hypercholesterolemia    Hypertension    Hypothyroidism    lung ca dx'd 2007   lung right   Sinus disease    hx of   Sleep apnea    uses cpap setting opf 12    ALLERGIES:  is allergic to lisinopril and prednisone.  MEDICATIONS:    Current Outpatient Medications  Medication Sig Dispense Refill   amitriptyline (ELAVIL) 50 MG tablet Take 50 mg by mouth at bedtime. Reported on 06/18/2015     aspirin 81 MG tablet Take 81 mg by mouth every morning.      Cholecalciferol 2000 units CAPS Take 2,000 Units by mouth daily.      clindamycin (CLEOCIN T) 1 % external solution APPLY TO AFFECTED AREA(S) TWO TIMES A DAY AS NEEDED FOR SKIN IRRITATION 30 mL 0   diltiazem (TIAZAC) 180 MG 24 hr capsule TAKE ONE CAPSULE BY MOUTH DAILY 90 capsule 0   erlotinib (TARCEVA) 100 MG tablet Take 1 tablet (100 mg total) by mouth daily. Take on an empty stomach 1 hour before meals or 2 hours after.faxed to [facility] 90 tablet 3   famotidine (PEPCID) 20 MG tablet Take 1 tablet (20 mg total) by mouth 2 (two) times daily. 60 tablet 0   levETIRAcetam (KEPPRA) 750 MG tablet Take 750 mg by mouth at bedtime.      metoprolol succinate (TOPROL-XL) 50 MG 24 hr tablet Take 1 tablet (50 mg total) by mouth daily. Take with or immediately following a meal. 90 tablet 3   Multiple Vitamins-Minerals (CENTRUM SILVER PO) Take 1 tablet by mouth daily.      nitroGLYCERIN (NITROSTAT) 0.4 MG SL tablet Place 1 tablet (0.4 mg total) under the tongue every 5 (five) minutes as needed for  chest pain. Reported on 10/22/2015 30 tablet 11   polyethylene glycol (MIRALAX / GLYCOLAX) packet Take 17 g by mouth daily. Reported on 08/27/2015     psyllium (METAMUCIL) 58.6 % powder Take 1 packet by mouth daily. Reported on 08/27/2015     rosuvastatin (CRESTOR) 5 MG tablet Take 5 mg by mouth every morning.      SYNTHROID 112 MCG tablet Take 112 mcg by mouth daily.     valsartan-hydrochlorothiazide (DIOVAN-HCT) 320-25 MG per tablet Take 1 tablet by mouth every morning.      No current facility-administered medications for this visit.     SURGICAL HISTORY:  Past Surgical History:  Procedure Laterality Date   BIOPSY  05/21/2018   Procedure: BIOPSY;  Surgeon: Wonda Horner, MD;   Location: [facility];  Service: Endoscopy;;   COLONOSCOPY WITH PROPOFOL N/A 03/19/2013   Procedure: COLONOSCOPY WITH PROPOFOL;  Surgeon: Garlan Fair, MD;  Location: [facility];  Service: Endoscopy;  Laterality: N/A;   Cystourethroscopy, [facility].  04/16/2010   ESOPHAGOGASTRODUODENOSCOPY (EGD) WITH PROPOFOL N/A 05/21/2018   Procedure: ESOPHAGOGASTRODUODENOSCOPY (EGD) WITH PROPOFOL;  Surgeon: Wonda Horner, MD;  Location: [facility];  Service: Endoscopy;  Laterality: N/A;   NASAL SINUS SURGERY  1987   Right video-assisted thoracoscopy, right upper lobectomy and  01/23/2006   The Olympus video bronchoscope was introduced via the right  12/21/2005   TONSILLECTOMY  1957   Torn medial and lateral menisci, left knee.  11/06/2006    REVIEW OF SYSTEMS:  Constitutional: positive for fatigue Eyes: negative Ears, nose, mouth, throat, and face: negative Respiratory: negative Cardiovascular: negative Gastrointestinal: negative Genitourinary:negative Integument/breast: negative Hematologic/lymphatic: negative Musculoskeletal:negative Neurological: negative Behavioral/Psych: negative Endocrine: negative Allergic/Immunologic: negative   PHYSICAL EXAMINATION: General appearance: alert, cooperative, fatigued and no distress Head: Normocephalic, without obvious abnormality, atraumatic Neck: no adenopathy, no JVD, supple, symmetrical, trachea midline and thyroid not enlarged, symmetric, no tenderness/mass/nodules Lymph nodes: Cervical, supraclavicular, and axillary nodes normal. Resp: clear to auscultation bilaterally Back: symmetric, no curvature. ROM normal. No CVA tenderness. Cardio: regular rate and rhythm, S1, S2 normal, no murmur, click, rub or gallop GI: soft, non-tender; bowel sounds normal; no masses,  no organomegaly Extremities: extremities normal, atraumatic, no cyanosis or edema Neurologic: Alert and oriented X 3, normal strength and tone. Normal symmetric reflexes.  Normal coordination and gait  ECOG PERFORMANCE STATUS: 1 - Symptomatic but completely ambulatory  Blood pressure 136/82, pulse 74, temperature 98.2 F (36.8 C), temperature source Temporal, resp. rate 18, height 6' 1" (1.854 m), weight 227 lb 8 oz (103.2 kg), SpO2 99 %.  LABORATORY DATA: Lab Results  Component Value Date   WBC 6.6 01/28/2019   HGB 13.8 01/28/2019   HCT 40.7 01/28/2019   MCV 100.7 (H) 01/28/2019   PLT 193 01/28/2019      Chemistry      Component Value Date/Time   NA 142 01/28/2019 1000   NA 139 04/06/2017 0932   K 3.7 01/28/2019 1000   K 3.8 04/06/2017 0932   CL 104 01/28/2019 1000   CL 102 04/24/2012 0853   CO2 29 01/28/2019 1000   CO2 29 04/06/2017 0932   BUN 15 01/28/2019 1000   BUN 14.4 04/06/2017 0932   CREATININE 1.16 01/28/2019 1000   CREATININE 1.0 04/06/2017 0932      Component Value Date/Time   CALCIUM 9.4 01/28/2019 1000   CALCIUM 9.5 04/06/2017 0932   ALKPHOS 56 01/28/2019 1000   ALKPHOS 58 04/06/2017 0932   AST 25  01/28/2019 1000   AST 27 04/06/2017 0932   ALT 26 01/28/2019 1000   ALT 27 04/06/2017 0932   BILITOT 0.5 01/28/2019 1000   BILITOT 0.45 04/06/2017 0932       RADIOGRAPHIC STUDIES: Ct Chest W Contrast  Result Date: 01/29/2019 CLINICAL DATA:  Non-small-cell lung cancer. Restaging. EXAM: CT CHEST, ABDOMEN, AND PELVIS WITH CONTRAST TECHNIQUE: Multidetector CT imaging of the chest, abdomen and pelvis was performed following the standard protocol during bolus administration of intravenous contrast. CONTRAST:  119m OMNIPAQUE IOHEXOL 300 MG/ML  SOLN COMPARISON:  10/02/2018 FINDINGS: CT CHEST FINDINGS Cardiovascular: The heart size is normal. No substantial pericardial effusion. Ascending thoracic aorta measures 4.1 cm diameter. Mediastinum/Nodes: No mediastinal lymphadenopathy. There is no hilar lymphadenopathy. The esophagus has normal imaging features. There is no axillary lymphadenopathy. Lungs/Pleura: Status post right upper  lobectomy. The right lower lobe pulmonary nodule identified previously is similar today measuring 2.5 x 1.3 cm (56/6) compared to 2.5 x 1.4 cm previously. Medial right lung nodule measured previously at 1.5 cm long axis is not substantially changed at 1.6 mm today (39/6). No suspicious nodule or mass in the left lung. No pleural effusion. Musculoskeletal: No worrisome lytic or sclerotic osseous abnormality. CT ABDOMEN PELVIS FINDINGS Hepatobiliary: No suspicious focal abnormality within the liver parenchyma. 2.6 cm gallstone evident. No intrahepatic or extrahepatic biliary dilation. Pancreas: No focal mass lesion. No dilatation of the main duct. No intraparenchymal cyst. No peripancreatic edema. Spleen: No splenomegaly. No focal mass lesion. Adrenals/Urinary Tract: No adrenal nodule or mass. 2.1 cm exophytic low-density lesion interpolar right kidney is stable and compatible with cyst. Left kidney unremarkable. No evidence for hydroureter. The urinary bladder appears normal for the degree of distention. Stomach/Bowel: Stomach is unremarkable. No gastric wall thickening. No evidence of outlet obstruction. Duodenum is normally positioned as is the ligament of Treitz. No small bowel wall thickening. No small bowel dilatation. The terminal ileum is normal. The appendix is normal. No gross colonic mass. No colonic wall thickening. Vascular/Lymphatic: There is abdominal aortic atherosclerosis without aneurysm. There is no gastrohepatic or hepatoduodenal ligament lymphadenopathy. No intraperitoneal or retroperitoneal lymphadenopathy. No pelvic sidewall lymphadenopathy. Reproductive: The prostate gland and seminal vesicles are unremarkable. Other: No intraperitoneal free fluid. Musculoskeletal: No worrisome lytic or sclerotic osseous abnormality. IMPRESSION: 1. Stable pulmonary nodules in the right lower lung, identified previously as metastatic disease. No new or progressive findings in the chest, abdomen, or pelvis. 2.  Status post right upper lobectomy. 3. Cholelithiasis. 4. Aortic Atherosclerosis (ICD10-I70.0). Electronically Signed   By: EMisty StanleyM.D.   On: 01/29/2019 13:47   Ct Abdomen Pelvis W Contrast  Result Date: 01/29/2019 CLINICAL DATA:  Non-small-cell lung cancer. Restaging. EXAM: CT CHEST, ABDOMEN, AND PELVIS WITH CONTRAST TECHNIQUE: Multidetector CT imaging of the chest, abdomen and pelvis was performed following the standard protocol during bolus administration of intravenous contrast. CONTRAST:  10109mOMNIPAQUE IOHEXOL 300 MG/ML  SOLN COMPARISON:  10/02/2018 FINDINGS: CT CHEST FINDINGS Cardiovascular: The heart size is normal. No substantial pericardial effusion. Ascending thoracic aorta measures 4.1 cm diameter. Mediastinum/Nodes: No mediastinal lymphadenopathy. There is no hilar lymphadenopathy. The esophagus has normal imaging features. There is no axillary lymphadenopathy. Lungs/Pleura: Status post right upper lobectomy. The right lower lobe pulmonary nodule identified previously is similar today measuring 2.5 x 1.3 cm (56/6) compared to 2.5 x 1.4 cm previously. Medial right lung nodule measured previously at 1.5 cm long axis is not substantially changed at 1.6 mm today (39/6). No suspicious nodule or mass in  the left lung. No pleural effusion. Musculoskeletal: No worrisome lytic or sclerotic osseous abnormality. CT ABDOMEN PELVIS FINDINGS Hepatobiliary: No suspicious focal abnormality within the liver parenchyma. 2.6 cm gallstone evident. No intrahepatic or extrahepatic biliary dilation. Pancreas: No focal mass lesion. No dilatation of the main duct. No intraparenchymal cyst. No peripancreatic edema. Spleen: No splenomegaly. No focal mass lesion. Adrenals/Urinary Tract: No adrenal nodule or mass. 2.1 cm exophytic low-density lesion interpolar right kidney is stable and compatible with cyst. Left kidney unremarkable. No evidence for hydroureter. The urinary bladder appears normal for the degree of  distention. Stomach/Bowel: Stomach is unremarkable. No gastric wall thickening. No evidence of outlet obstruction. Duodenum is normally positioned as is the ligament of Treitz. No small bowel wall thickening. No small bowel dilatation. The terminal ileum is normal. The appendix is normal. No gross colonic mass. No colonic wall thickening. Vascular/Lymphatic: There is abdominal aortic atherosclerosis without aneurysm. There is no gastrohepatic or hepatoduodenal ligament lymphadenopathy. No intraperitoneal or retroperitoneal lymphadenopathy. No pelvic sidewall lymphadenopathy. Reproductive: The prostate gland and seminal vesicles are unremarkable. Other: No intraperitoneal free fluid. Musculoskeletal: No worrisome lytic or sclerotic osseous abnormality. IMPRESSION: 1. Stable pulmonary nodules in the right lower lung, identified previously as metastatic disease. No new or progressive findings in the chest, abdomen, or pelvis. 2. Status post right upper lobectomy. 3. Cholelithiasis. 4. Aortic Atherosclerosis (ICD10-I70.0). Electronically Signed   By: Misty Stanley M.D.   On: 01/29/2019 13:47    ASSESSMENT AND PLAN:  This is a very pleasant 73 years old white male with recurrent non-small cell lung cancer, adenocarcinoma and positive EGFR mutation in exon 21. He is currently on Tarceva 100 mg by mouth daily status post more than 54 months of treatment. The patient has been tolerating his treatment with Tarceva fairly well with no concerning complaints except for mild fatigue. He had repeat CT scan of the chest, abdomen pelvis performed recently.  I personally and independently reviewed the scans and discussed the results with the patient today. His scan showed no concerning findings for disease progression. I recommended for the patient to continue his current treatment with Tarceva with the same dose. Regarding the acid reflux, he will continue his treatment with Pepcid 2 hours after his morning dose of Tarceva  and 10 hours before the next dose. The patient will receive flu vaccine in the clinic today. I will see him back for follow-up visit in 2 months for evaluation with repeat blood work. He was advised to call immediately if he has any concerning symptoms in the interval. The patient voices understanding of current disease status and treatment options and is in agreement with the current care plan. All questions were answered. The patient knows to call the clinic with any problems, questions or concerns. We can certainly see the patient much sooner if necessary.  Disclaimer: This note was dictated with voice recognition software. Similar sounding words can inadvertently be transcribed and may not be corrected upon review.

## 2019-01-31 NOTE — Telephone Encounter (Signed)
Scheduled per 10/29 los, patient received after visit summary and calender.

## 2019-02-01 ENCOUNTER — Encounter: Payer: Self-pay | Admitting: Interventional Cardiology

## 2019-02-01 ENCOUNTER — Ambulatory Visit (INDEPENDENT_AMBULATORY_CARE_PROVIDER_SITE_OTHER): Payer: Medicare Other | Admitting: Interventional Cardiology

## 2019-02-01 VITALS — BP 132/76 | HR 50 | Ht 72.0 in | Wt 231.8 lb

## 2019-02-01 DIAGNOSIS — I10 Essential (primary) hypertension: Secondary | ICD-10-CM | POA: Diagnosis not present

## 2019-02-01 DIAGNOSIS — Z7189 Other specified counseling: Secondary | ICD-10-CM

## 2019-02-01 DIAGNOSIS — E785 Hyperlipidemia, unspecified: Secondary | ICD-10-CM

## 2019-02-01 DIAGNOSIS — I209 Angina pectoris, unspecified: Secondary | ICD-10-CM

## 2019-02-01 DIAGNOSIS — R072 Precordial pain: Secondary | ICD-10-CM | POA: Diagnosis not present

## 2019-02-01 DIAGNOSIS — G4730 Sleep apnea, unspecified: Secondary | ICD-10-CM

## 2019-02-01 NOTE — Patient Instructions (Signed)
Medication Instructions:  Your physician recommends that you continue on your current medications as directed. Please refer to the Current Medication list given to you today.  *If you need a refill on your cardiac medications before your next appointment, please call your pharmacy*  Lab Work: You will need to have your kidney function checked prior to your CT.   If you have labs (blood work) drawn today and your tests are completely normal, you will receive your results only by: [name] MyChart Message (if you have MyChart) OR . A paper copy in the mail If you have any lab test that is abnormal or we need to change your treatment, we will call you to review the results.  Testing/Procedures: Your physician recommends that you have a Coronary CT performed.   Follow-Up: At [facility], you and your health needs are our priority.  As part of our continuing mission to provide you with exceptional heart care, we have created designated Provider Care Teams.  These Care Teams include your primary Cardiologist (physician) and Advanced Practice Providers (APPs -  Physician Assistants and Nurse Practitioners) who all work together to provide you with the care you need, when you need it.  Your next appointment:   6 months  The format for your next appointment:   In Person  Provider:   You may see Sinclair Grooms, MD or one of the following Advanced Practice Providers on your designated Care Team:    Truitt Merle, NP  Cecilie Kicks, NP  Kathyrn Drown, NP   Other Instructions  Your cardiac CT will be scheduled at one of the below locations:   [facility] [address] [place], [place] [address] [phone]  [place] [address] [place], [place] [address] [phone]  If scheduled at [facility], please arrive at the [facility] main entrance of [facility] 30-45 minutes prior to test start  time. Proceed to the [facility] Radiology Department (first floor) to check-in and test prep.  If scheduled at [facility], please arrive 15 mins early for check-in and test prep.  Please follow these instructions carefully (unless otherwise directed):  Hold all erectile dysfunction medications at least 3 days (72 hrs) prior to test.  On the Night Before the Test: . Be sure to Drink plenty of water. . Do not consume any caffeinated/decaffeinated beverages or chocolate 12 hours prior to your test. . Do not take any antihistamines 12 hours prior to your test.  On the Day of the Test: . Drink plenty of water. Do not drink any water within one hour of the test. . Do not eat any food 4 hours prior to the test. . You may take your regular medications prior to the test.  . Take metoprolol (Lopressor) two hours prior to test. . HOLD Furosemide/Hydrochlorothiazide morning of the test.       After the Test: . Drink plenty of water. . After receiving IV contrast, you may experience a mild flushed feeling. This is normal. . On occasion, you may experience a mild rash up to 24 hours after the test. This is not dangerous. If this occurs, you can take Benadryl 25 mg and increase your fluid intake. . If you experience trouble breathing, this can be serious. If it is severe call 911 IMMEDIATELY. If it is mild, please call our office. . If you take any of these medications: Glipizide/Metformin, Avandament, Glucavance,  please do not take 48 hours after completing test unless otherwise instructed.   Once we have confirmed authorization from your insurance company, we will call you to set up a date and time for your test.   For non-scheduling related questions, please contact the cardiac imaging nurse navigator should you have any questions/concerns: Marchia Bond, RN Navigator Cardiac Imaging [facility] [phone] Office

## 2019-02-14 DIAGNOSIS — C3490 Malignant neoplasm of unspecified part of unspecified bronchus or lung: Secondary | ICD-10-CM | POA: Diagnosis not present

## 2019-02-14 DIAGNOSIS — G43111 Migraine with aura, intractable, with status migrainosus: Secondary | ICD-10-CM | POA: Diagnosis not present

## 2019-02-14 DIAGNOSIS — G43019 Migraine without aura, intractable, without status migrainosus: Secondary | ICD-10-CM | POA: Diagnosis not present

## 2019-02-14 DIAGNOSIS — K219 Gastro-esophageal reflux disease without esophagitis: Secondary | ICD-10-CM | POA: Diagnosis not present

## 2019-03-08 ENCOUNTER — Other Ambulatory Visit: Payer: Self-pay | Admitting: *Deleted

## 2019-03-08 DIAGNOSIS — I10 Essential (primary) hypertension: Secondary | ICD-10-CM

## 2019-03-11 ENCOUNTER — Other Ambulatory Visit: Payer: Self-pay

## 2019-03-11 ENCOUNTER — Other Ambulatory Visit: Payer: Medicare Other | Admitting: *Deleted

## 2019-03-11 DIAGNOSIS — I10 Essential (primary) hypertension: Secondary | ICD-10-CM

## 2019-03-11 LAB — BASIC METABOLIC PANEL
BUN/Creatinine Ratio: 16 (ref 10–24)
BUN: 18 mg/dL (ref 8–27)
CO2: 25 mmol/L (ref 20–29)
Calcium: 9.9 mg/dL (ref 8.6–10.2)
Chloride: 100 mmol/L (ref 96–106)
Creatinine, Ser: 1.11 mg/dL (ref 0.76–1.27)
GFR calc Af Amer: 76 mL/min/{1.73_m2} (ref >59)
GFR calc non Af Amer: 66 mL/min/{1.73_m2} (ref >59)
Glucose: 94 mg/dL (ref 65–99)
Potassium: 3.9 mmol/L (ref 3.5–5.2)
Sodium: 141 mmol/L (ref 134–144)

## 2019-03-20 ENCOUNTER — Other Ambulatory Visit: Payer: Self-pay | Admitting: Otolaryngology

## 2019-03-20 DIAGNOSIS — K219 Gastro-esophageal reflux disease without esophagitis: Secondary | ICD-10-CM

## 2019-03-23 ENCOUNTER — Other Ambulatory Visit: Payer: Self-pay | Admitting: Interventional Cardiology

## 2019-03-26 ENCOUNTER — Encounter (HOSPITAL_COMMUNITY): Payer: Self-pay

## 2019-03-28 ENCOUNTER — Ambulatory Visit (HOSPITAL_COMMUNITY): Payer: Medicare Other

## 2019-04-01 ENCOUNTER — Ambulatory Visit
Admission: RE | Admit: 2019-04-01 | Discharge: 2019-04-01 | Disposition: A | Discharge status: Home / Self Care | Payer: Medicare Other | Source: Ambulatory Visit | Attending: Otolaryngology | Admitting: Otolaryngology

## 2019-04-01 DIAGNOSIS — K219 Gastro-esophageal reflux disease without esophagitis: Secondary | ICD-10-CM

## 2019-04-02 ENCOUNTER — Encounter: Payer: Self-pay | Admitting: Internal Medicine

## 2019-04-02 ENCOUNTER — Inpatient Hospital Stay: Payer: Medicare Other | Attending: Internal Medicine

## 2019-04-02 ENCOUNTER — Other Ambulatory Visit: Payer: Self-pay

## 2019-04-02 ENCOUNTER — Telehealth: Payer: Self-pay | Admitting: Internal Medicine

## 2019-04-02 ENCOUNTER — Inpatient Hospital Stay (HOSPITAL_BASED_OUTPATIENT_CLINIC_OR_DEPARTMENT_OTHER): Payer: Medicare Other | Admitting: Internal Medicine

## 2019-04-02 ENCOUNTER — Telehealth: Payer: Self-pay | Admitting: Hematology and Oncology

## 2019-04-02 VITALS — BP 134/77 | HR 62 | Temp 98.3°F | Resp 18 | Ht 72.0 in | Wt 229.2 lb

## 2019-04-02 DIAGNOSIS — I10 Essential (primary) hypertension: Secondary | ICD-10-CM | POA: Insufficient documentation

## 2019-04-02 DIAGNOSIS — E7800 Pure hypercholesterolemia, unspecified: Secondary | ICD-10-CM | POA: Insufficient documentation

## 2019-04-02 DIAGNOSIS — G4730 Sleep apnea, unspecified: Secondary | ICD-10-CM | POA: Insufficient documentation

## 2019-04-02 DIAGNOSIS — I209 Angina pectoris, unspecified: Secondary | ICD-10-CM

## 2019-04-02 DIAGNOSIS — E039 Hypothyroidism, unspecified: Secondary | ICD-10-CM | POA: Insufficient documentation

## 2019-04-02 DIAGNOSIS — Z79899 Other long term (current) drug therapy: Secondary | ICD-10-CM | POA: Diagnosis not present

## 2019-04-02 DIAGNOSIS — C3490 Malignant neoplasm of unspecified part of unspecified bronchus or lung: Secondary | ICD-10-CM

## 2019-04-02 DIAGNOSIS — K219 Gastro-esophageal reflux disease without esophagitis: Secondary | ICD-10-CM | POA: Insufficient documentation

## 2019-04-02 DIAGNOSIS — Z7982 Long term (current) use of aspirin: Secondary | ICD-10-CM | POA: Diagnosis not present

## 2019-04-02 DIAGNOSIS — C3431 Malignant neoplasm of lower lobe, right bronchus or lung: Secondary | ICD-10-CM

## 2019-04-02 DIAGNOSIS — Z5111 Encounter for antineoplastic chemotherapy: Secondary | ICD-10-CM | POA: Diagnosis not present

## 2019-04-02 LAB — CMP (CANCER CENTER ONLY)
ALT: 31 U/L (ref 0–44)
AST: 29 U/L (ref 15–41)
Albumin: 4.1 g/dL (ref 3.5–5.0)
Alkaline Phosphatase: 57 U/L (ref 38–126)
Anion gap: 9 (ref 5–15)
BUN: 14 mg/dL (ref 8–23)
CO2: 29 mmol/L (ref 22–32)
Calcium: 9.2 mg/dL (ref 8.9–10.3)
Chloride: 102 mmol/L (ref 98–111)
Creatinine: 1.03 mg/dL (ref 0.61–1.24)
GFR, Est AFR Am: >60 mL/min (ref >60)
GFR, Estimated: >60 mL/min (ref >60)
Glucose, Bld: 96 mg/dL (ref 70–99)
Potassium: 3.6 mmol/L (ref 3.5–5.1)
Sodium: 140 mmol/L (ref 135–145)
Total Bilirubin: 0.6 mg/dL (ref 0.3–1.2)
Total Protein: 6.6 g/dL (ref 6.5–8.1)

## 2019-04-02 LAB — CBC WITH DIFFERENTIAL (CANCER CENTER ONLY)
Abs Immature Granulocytes: 0.01 10*3/uL (ref 0.00–0.07)
Basophils Absolute: 0 10*3/uL (ref 0.0–0.1)
Basophils Relative: 1 %
Eosinophils Absolute: 0.3 10*3/uL (ref 0.0–0.5)
Eosinophils Relative: 6 %
HCT: 41.3 % (ref 39.0–52.0)
Hemoglobin: 14.1 g/dL (ref 13.0–17.0)
Immature Granulocytes: 0 %
Lymphocytes Relative: 39 %
Lymphs Abs: 2.2 10*3/uL (ref 0.7–4.0)
MCH: 34.3 pg — ABNORMAL HIGH (ref 26.0–34.0)
MCHC: 34.1 g/dL (ref 30.0–36.0)
MCV: 100.5 fL — ABNORMAL HIGH (ref 80.0–100.0)
Monocytes Absolute: 0.6 10*3/uL (ref 0.1–1.0)
Monocytes Relative: 11 %
Neutro Abs: 2.5 10*3/uL (ref 1.7–7.7)
Neutrophils Relative %: 43 %
Platelet Count: 187 10*3/uL (ref 150–400)
RBC: 4.11 MIL/uL — ABNORMAL LOW (ref 4.22–5.81)
RDW: 12.1 % (ref 11.5–15.5)
WBC Count: 5.6 10*3/uL (ref 4.0–10.5)
nRBC: 0 % (ref 0.0–0.2)

## 2019-04-02 NOTE — Progress Notes (Signed)
[place] [phone]   [phone]  OFFICE PROGRESS NOTE  [name], MD 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  PRIOR THERAPY: 1) Status post right upper lobectomy under the care of Dr. Roxan Hockey on 01/23/2006.  2) Tarceva 150 mg by mouth daily as part of the BMS checkmate 370 clinical trial. Status post 6 weeks of treatment.  CURRENT THERAPY: Tarceva 100 mg by mouth daily as part of the BMS Checkmate 370 clinical trial. First dose started 07/17/2014 status post 56 months of treatment.  INTERVAL HISTORY: [name] 73 y.o. male returns to the clinic today for follow-up visit.  The patient is feeling fine today with no concerning complaints.  He denied having any current chest pain, shortness of breath, cough or hemoptysis.  He denied having any fever or chills.  He has no nausea, vomiting, diarrhea or constipation.  He denied having any headache or visual changes.  He continues to tolerate his treatment with Tarceva fairly well.  The patient is here today for evaluation and repeat blood work.   MEDICAL HISTORY: Past Medical History:  Diagnosis Date   Cholelithiases 10/17/9676   Complication of anesthesia 2007   quit breathing with bronchoscopy, had to spend night   History of agent Orange exposure 44-45 yrs ago   History of pneumonia  last 3-4 yrs ago   3 -4 different times   Hypercholesterolemia    Hypertension    Hypothyroidism    lung ca dx'd 2007   lung right   Sinus disease    hx of   Sleep apnea    uses cpap setting opf 12    ALLERGIES:  is allergic to lisinopril and prednisone.  MEDICATIONS:  Current Outpatient Medications  Medication Sig Dispense Refill   amitriptyline (ELAVIL) 50 MG tablet Take 50 mg by mouth at  bedtime. Reported on 06/18/2015     aspirin 81 MG tablet Take 81 mg by mouth every morning.      Cholecalciferol 2000 units CAPS Take 2,000 Units by mouth daily.      clindamycin (CLEOCIN T) 1 % external solution APPLY TO AFFECTED AREA(S) TWO TIMES A DAY AS NEEDED FOR SKIN IRRITATION 30 mL 0   diltiazem (TIAZAC) 180 MG 24 hr capsule TAKE ONE CAPSULE BY MOUTH DAILY 90 capsule 3   erlotinib (TARCEVA) 100 MG tablet Take 1 tablet (100 mg total) by mouth daily. Take on an empty stomach 1 hour before meals or 2 hours after.faxed to [facility] 90 tablet 3   famotidine (PEPCID) 20 MG tablet Take 1 tablet (20 mg total) by mouth 2 (two) times daily. 60 tablet 0   levETIRAcetam (KEPPRA) 750 MG tablet Take 750 mg by mouth at bedtime.      metoprolol succinate (TOPROL-XL) 50 MG 24 hr tablet Take 1 tablet (50 mg total) by mouth daily. Take with or immediately following a meal. 90 tablet 3   Multiple Vitamins-Minerals (CENTRUM SILVER PO) Take 1 tablet by mouth daily.      nitroGLYCERIN (NITROSTAT) 0.4 MG SL tablet Place 1 tablet (0.4 mg total) under the tongue every 5 (five) minutes as needed for chest pain. Reported on 10/22/2015 30 tablet 11   polyethylene glycol (MIRALAX / GLYCOLAX) packet Take 17 g by mouth daily. Reported on 08/27/2015  psyllium (METAMUCIL) 58.6 % powder Take 1 packet by mouth daily. Reported on 08/27/2015     rosuvastatin (CRESTOR) 5 MG tablet Take 5 mg by mouth every morning.      SYNTHROID 112 MCG tablet Take 112 mcg by mouth daily.     valsartan-hydrochlorothiazide (DIOVAN-HCT) 320-25 MG per tablet Take 1 tablet by mouth every morning.      No current facility-administered medications for this visit.    SURGICAL HISTORY:  Past Surgical History:  Procedure Laterality Date   BIOPSY  05/21/2018   Procedure: BIOPSY;  Surgeon: Wonda Horner, MD;  Location: [facility];  Service: Endoscopy;;   COLONOSCOPY WITH PROPOFOL N/A 03/19/2013   Procedure: COLONOSCOPY WITH  PROPOFOL;  Surgeon: Garlan Fair, MD;  Location: [facility];  Service: Endoscopy;  Laterality: N/A;   Cystourethroscopy, [facility] T[facility].  04/16/2010   ESOPHAGOGASTRODUODENOSCOPY (EGD) WITH PROPOFOL N/A 05/21/2018   Procedure: ESOPHAGOGASTRODUODENOSCOPY (EGD) WITH PROPOFOL;  Surgeon: Wonda Horner, MD;  Location: [facility];  Service: Endoscopy;  Laterality: N/A;   NASAL SINUS SURGERY  1987   Right video-assisted thoracoscopy, right upper lobectomy and  01/23/2006   The Olympus video bronchoscope was introduced via the right  12/21/2005   TONSILLECTOMY  1957   Torn medial and lateral menisci, left knee.  11/06/2006    REVIEW OF SYSTEMS:  A comprehensive review of systems was negative.   PHYSICAL EXAMINATION: General appearance: alert, cooperative and no distress Head: Normocephalic, without obvious abnormality, atraumatic Neck: no adenopathy, no JVD, supple, symmetrical, trachea midline and thyroid not enlarged, symmetric, no tenderness/mass/nodules Lymph nodes: Cervical, supraclavicular, and axillary nodes normal. Resp: clear to auscultation bilaterally Back: symmetric, no curvature. ROM normal. No CVA tenderness. Cardio: regular rate and rhythm, S1, S2 normal, no murmur, click, rub or gallop GI: soft, non-tender; bowel sounds normal; no masses,  no organomegaly Extremities: extremities normal, atraumatic, no cyanosis or edema  ECOG PERFORMANCE STATUS: 1 - Symptomatic but completely ambulatory  Blood pressure 134/77, pulse 62, temperature 98.3 F (36.8 C), temperature source Oral, resp. rate 18, height 6' (1.829 m), weight 229 lb 3.2 oz (104 kg), SpO2 100 %.  LABORATORY DATA: Lab Results  Component Value Date   WBC 5.6 04/02/2019   HGB 14.1 04/02/2019   HCT 41.3 04/02/2019   MCV 100.5 (H) 04/02/2019   PLT 187 04/02/2019      Chemistry      Component Value Date/Time   NA 140 04/02/2019 0958   NA 141 03/11/2019 0959   NA 139 04/06/2017 0932   K 3.6 04/02/2019 0958    K 3.8 04/06/2017 0932   CL 102 04/02/2019 0958   CL 102 04/24/2012 0853   CO2 29 04/02/2019 0958   CO2 29 04/06/2017 0932   BUN 14 04/02/2019 0958   BUN 18 03/11/2019 0959   BUN 14.4 04/06/2017 0932   CREATININE 1.03 04/02/2019 0958   CREATININE 1.0 04/06/2017 0932      Component Value Date/Time   CALCIUM 9.2 04/02/2019 0958   CALCIUM 9.5 04/06/2017 0932   ALKPHOS 57 04/02/2019 0958   ALKPHOS 58 04/06/2017 0932   AST 29 04/02/2019 0958   AST 27 04/06/2017 0932   ALT 31 04/02/2019 0958   ALT 27 04/06/2017 0932   BILITOT 0.6 04/02/2019 0958   BILITOT 0.45 04/06/2017 0932       RADIOGRAPHIC STUDIES: DG ESOPHAGUS W SINGLE CM (SOL OR THIN BA)  Result Date: 04/01/2019 CLINICAL DATA:  Globus sensation, dysphagia. EXAM: ESOPHOGRAM / BARIUM  SWALLOW / BARIUM TABLET STUDY TECHNIQUE: Combined double contrast and single contrast examination performed using effervescent crystals, thick barium liquid, and thin barium liquid. The patient was observed with fluoroscopy swallowing a 13 mm barium sulphate tablet. FLUOROSCOPY TIME:  Fluoroscopy Time:  1 minutes 54 seconds Radiation Exposure Index (if provided by the fluoroscopic device): One hundred seventy-four mGy Number of Acquired Spot Images: 0 COMPARISON:  Chest CT 01/29/2019 FINDINGS: Fluoroscopic evaluation of swallowing demonstrates normal appearance of the cervical esophagus in the area of perceived food sticking. No penetration or aspiration. There is slight impression on the posterior wall of the cervical esophagus due to bone spurs at C6-7. There is disruption of primary esophageal peristaltic waves. No fixed stricture, fold thickening or mass. No reflux noted. The patient swallowed a 13 mm barium tablet which freely passed into the stomach and did not reproduce the patient's symptoms. IMPRESSION: No fixed esophageal stricture. Nonspecific esophageal motility disorder. Electronically Signed   By: Rolm Baptise M.D.   On: 04/01/2019 10:43     ASSESSMENT AND PLAN:  This is a very pleasant 73 years old white male with recurrent non-small cell lung cancer, adenocarcinoma and positive EGFR mutation in exon 21. He is currently on Tarceva 100 mg by mouth daily status post more than 56 months of treatment. He continues to tolerate his treatment with Tarceva fairly well with no significant adverse effects. I recommended for the patient to continue his current treatment with the same dose. I will see him back for follow-up visit in 2 months for evaluation with repeat CT scan of the chest, abdomen pelvis for restaging of his disease. The patient was advised to call immediately if he has any concerning symptoms in the interval. The patient voices understanding of current disease status and treatment options and is in agreement with the current care plan. All questions were answered. The patient knows to call the clinic with any problems, questions or concerns. We can certainly see the patient much sooner if necessary.  Disclaimer: This note was dictated with voice recognition software. Similar sounding words can inadvertently be transcribed and may not be corrected upon review.

## 2019-04-02 NOTE — Telephone Encounter (Signed)
Scheduled per los. Gave avs and calendar  

## 2019-04-03 ENCOUNTER — Telehealth (HOSPITAL_COMMUNITY): Payer: Self-pay | Admitting: Emergency Medicine

## 2019-04-03 ENCOUNTER — Encounter (HOSPITAL_COMMUNITY): Payer: Self-pay

## 2019-04-03 DIAGNOSIS — C3490 Malignant neoplasm of unspecified part of unspecified bronchus or lung: Secondary | ICD-10-CM | POA: Diagnosis not present

## 2019-04-03 DIAGNOSIS — I10 Essential (primary) hypertension: Secondary | ICD-10-CM | POA: Diagnosis not present

## 2019-04-03 DIAGNOSIS — I208 Other forms of angina pectoris: Secondary | ICD-10-CM | POA: Diagnosis not present

## 2019-04-03 DIAGNOSIS — E785 Hyperlipidemia, unspecified: Secondary | ICD-10-CM | POA: Diagnosis not present

## 2019-04-03 DIAGNOSIS — E782 Mixed hyperlipidemia: Secondary | ICD-10-CM | POA: Diagnosis not present

## 2019-04-03 DIAGNOSIS — E039 Hypothyroidism, unspecified: Secondary | ICD-10-CM | POA: Diagnosis not present

## 2019-04-03 DIAGNOSIS — I25110 Atherosclerotic heart disease of native coronary artery with unstable angina pectoris: Secondary | ICD-10-CM | POA: Diagnosis not present

## 2019-04-03 DIAGNOSIS — D649 Anemia, unspecified: Secondary | ICD-10-CM | POA: Diagnosis not present

## 2019-04-03 DIAGNOSIS — N400 Enlarged prostate without lower urinary tract symptoms: Secondary | ICD-10-CM | POA: Diagnosis not present

## 2019-04-03 NOTE — Telephone Encounter (Signed)
Left message on voicemail with name and callback number Tyheim Vanalstyne RN Navigator Cardiac Imaging [facility] [phone] Office [phone] Cell  

## 2019-04-04 ENCOUNTER — Encounter (HOSPITAL_COMMUNITY): Payer: Self-pay

## 2019-04-04 ENCOUNTER — Ambulatory Visit (HOSPITAL_COMMUNITY)
Admission: RE | Admit: 2019-04-04 | Discharge: 2019-04-04 | Disposition: A | Discharge status: Home / Self Care | Payer: Medicare Other | Source: Ambulatory Visit | Attending: Interventional Cardiology | Admitting: Interventional Cardiology

## 2019-04-04 ENCOUNTER — Other Ambulatory Visit: Payer: Self-pay

## 2019-04-04 DIAGNOSIS — R072 Precordial pain: Secondary | ICD-10-CM | POA: Insufficient documentation

## 2019-04-04 DIAGNOSIS — I209 Angina pectoris, unspecified: Secondary | ICD-10-CM | POA: Diagnosis present

## 2019-04-04 MED ORDER — NITROGLYCERIN 0.4 MG SL SUBL
0.8000 mg | SUBLINGUAL_TABLET | Freq: Once | SUBLINGUAL | Status: AC
  Administered 2019-04-04: 0.8 mg via SUBLINGUAL

## 2019-04-04 MED ORDER — NITROGLYCERIN 0.4 MG SL SUBL
SUBLINGUAL_TABLET | SUBLINGUAL | Status: AC
  Filled 2019-04-04: qty 2

## 2019-04-04 NOTE — Progress Notes (Signed)
During CT Coronary scan, the CT tech informed writer that pt had barium in his abdomen area and there would be too much artifact for a good scan.  Dr. Johnsie Cancel called and scan would need to be canceled for today.

## 2019-04-08 ENCOUNTER — Other Ambulatory Visit: Payer: Self-pay | Admitting: *Deleted

## 2019-04-08 DIAGNOSIS — R072 Precordial pain: Secondary | ICD-10-CM

## 2019-04-26 DIAGNOSIS — E782 Mixed hyperlipidemia: Secondary | ICD-10-CM | POA: Diagnosis not present

## 2019-04-26 DIAGNOSIS — I25110 Atherosclerotic heart disease of native coronary artery with unstable angina pectoris: Secondary | ICD-10-CM | POA: Diagnosis not present

## 2019-04-26 DIAGNOSIS — N400 Enlarged prostate without lower urinary tract symptoms: Secondary | ICD-10-CM | POA: Diagnosis not present

## 2019-04-26 DIAGNOSIS — D649 Anemia, unspecified: Secondary | ICD-10-CM | POA: Diagnosis not present

## 2019-04-26 DIAGNOSIS — I10 Essential (primary) hypertension: Secondary | ICD-10-CM | POA: Diagnosis not present

## 2019-04-26 DIAGNOSIS — C3490 Malignant neoplasm of unspecified part of unspecified bronchus or lung: Secondary | ICD-10-CM | POA: Diagnosis not present

## 2019-04-26 DIAGNOSIS — I208 Other forms of angina pectoris: Secondary | ICD-10-CM | POA: Diagnosis not present

## 2019-04-26 DIAGNOSIS — E785 Hyperlipidemia, unspecified: Secondary | ICD-10-CM | POA: Diagnosis not present

## 2019-04-26 DIAGNOSIS — E039 Hypothyroidism, unspecified: Secondary | ICD-10-CM | POA: Diagnosis not present

## 2019-05-01 DIAGNOSIS — H5203 Hypermetropia, bilateral: Secondary | ICD-10-CM | POA: Diagnosis not present

## 2019-05-01 DIAGNOSIS — H2513 Age-related nuclear cataract, bilateral: Secondary | ICD-10-CM | POA: Diagnosis not present

## 2019-05-10 ENCOUNTER — Encounter (HOSPITAL_COMMUNITY): Payer: Self-pay

## 2019-05-13 ENCOUNTER — Telehealth (HOSPITAL_COMMUNITY): Payer: Self-pay | Admitting: Emergency Medicine

## 2019-05-13 NOTE — Telephone Encounter (Signed)
Left message on voicemail with name and callback number Lonnie Rosado RN Navigator Cardiac Imaging  [facility] [phone] Office [phone] Cell  

## 2019-05-14 ENCOUNTER — Other Ambulatory Visit: Payer: Self-pay

## 2019-05-14 ENCOUNTER — Ambulatory Visit (HOSPITAL_COMMUNITY)
Admission: RE | Admit: 2019-05-14 | Discharge: 2019-05-14 | Disposition: A | Discharge status: Home / Self Care | Payer: Medicare Other | Source: Ambulatory Visit | Attending: Interventional Cardiology | Admitting: Interventional Cardiology

## 2019-05-14 ENCOUNTER — Encounter (HOSPITAL_COMMUNITY): Payer: Self-pay

## 2019-05-14 DIAGNOSIS — R072 Precordial pain: Secondary | ICD-10-CM | POA: Insufficient documentation

## 2019-05-14 MED ORDER — NITROGLYCERIN 0.4 MG SL SUBL
SUBLINGUAL_TABLET | SUBLINGUAL | Status: AC
  Filled 2019-05-14: qty 2

## 2019-05-14 MED ORDER — IOHEXOL 350 MG/ML SOLN
80.0000 mL | Freq: Once | INTRAVENOUS | Status: AC | PRN
  Administered 2019-05-14: 80 mL via INTRAVENOUS

## 2019-05-15 DIAGNOSIS — I208 Other forms of angina pectoris: Secondary | ICD-10-CM | POA: Diagnosis not present

## 2019-05-15 DIAGNOSIS — D649 Anemia, unspecified: Secondary | ICD-10-CM | POA: Diagnosis not present

## 2019-05-15 DIAGNOSIS — N400 Enlarged prostate without lower urinary tract symptoms: Secondary | ICD-10-CM | POA: Diagnosis not present

## 2019-05-15 DIAGNOSIS — E782 Mixed hyperlipidemia: Secondary | ICD-10-CM | POA: Diagnosis not present

## 2019-05-15 DIAGNOSIS — E039 Hypothyroidism, unspecified: Secondary | ICD-10-CM | POA: Diagnosis not present

## 2019-05-15 DIAGNOSIS — I10 Essential (primary) hypertension: Secondary | ICD-10-CM | POA: Diagnosis not present

## 2019-05-15 DIAGNOSIS — C3490 Malignant neoplasm of unspecified part of unspecified bronchus or lung: Secondary | ICD-10-CM | POA: Diagnosis not present

## 2019-05-15 DIAGNOSIS — I25110 Atherosclerotic heart disease of native coronary artery with unstable angina pectoris: Secondary | ICD-10-CM | POA: Diagnosis not present

## 2019-05-26 ENCOUNTER — Ambulatory Visit: Payer: Medicare Other | Attending: Internal Medicine

## 2019-05-26 DIAGNOSIS — Z23 Encounter for immunization: Secondary | ICD-10-CM | POA: Insufficient documentation

## 2019-05-26 NOTE — Progress Notes (Signed)
   Covid-19 Vaccination Clinic  Name:  [name]    MRN: [id number] DOB: June 08, 1945  05/26/2019  [name] was observed post Covid-19 immunization for 15 minutes without incidence. He was provided with Vaccine Information Sheet and instruction to access the V-Safe system.   [name] was instructed to call 911 with any severe reactions post vaccine: [name] Difficulty breathing  . Swelling of your face and throat  . A fast heartbeat  . A bad rash all over your body  . Dizziness and weakness    Immunizations Administered    Name Date Dose VIS Date Route   Pfizer COVID-19 Vaccine 05/26/2019  2:43 PM 0.3 mL 03/15/2019 Intramuscular   Manufacturer: [place]   Lot: [id number]   [place]: [id number]

## 2019-05-30 ENCOUNTER — Other Ambulatory Visit: Payer: Self-pay

## 2019-05-30 ENCOUNTER — Inpatient Hospital Stay: Payer: Medicare Other | Attending: Internal Medicine

## 2019-05-30 DIAGNOSIS — C3431 Malignant neoplasm of lower lobe, right bronchus or lung: Secondary | ICD-10-CM | POA: Insufficient documentation

## 2019-05-30 DIAGNOSIS — C3490 Malignant neoplasm of unspecified part of unspecified bronchus or lung: Secondary | ICD-10-CM

## 2019-05-30 LAB — CBC WITH DIFFERENTIAL (CANCER CENTER ONLY)
Abs Immature Granulocytes: 0.01 10*3/uL (ref 0.00–0.07)
Basophils Absolute: 0.1 10*3/uL (ref 0.0–0.1)
Basophils Relative: 1 %
Eosinophils Absolute: 0.3 10*3/uL (ref 0.0–0.5)
Eosinophils Relative: 6 %
HCT: 41.8 % (ref 39.0–52.0)
Hemoglobin: 14.6 g/dL (ref 13.0–17.0)
Immature Granulocytes: 0 %
Lymphocytes Relative: 42 %
Lymphs Abs: 2.4 10*3/uL (ref 0.7–4.0)
MCH: 35.4 pg — ABNORMAL HIGH (ref 26.0–34.0)
MCHC: 34.9 g/dL (ref 30.0–36.0)
MCV: 101.2 fL — ABNORMAL HIGH (ref 80.0–100.0)
Monocytes Absolute: 0.6 10*3/uL (ref 0.1–1.0)
Monocytes Relative: 11 %
Neutro Abs: 2.3 10*3/uL (ref 1.7–7.7)
Neutrophils Relative %: 40 %
Platelet Count: 186 10*3/uL (ref 150–400)
RBC: 4.13 MIL/uL — ABNORMAL LOW (ref 4.22–5.81)
RDW: 11.9 % (ref 11.5–15.5)
WBC Count: 5.8 10*3/uL (ref 4.0–10.5)
nRBC: 0 % (ref 0.0–0.2)

## 2019-05-30 LAB — CMP (CANCER CENTER ONLY)
ALT: 37 U/L (ref 0–44)
AST: 36 U/L (ref 15–41)
Albumin: 4.3 g/dL (ref 3.5–5.0)
Alkaline Phosphatase: 65 U/L (ref 38–126)
Anion gap: 10 (ref 5–15)
BUN: 15 mg/dL (ref 8–23)
CO2: 26 mmol/L (ref 22–32)
Calcium: 9.5 mg/dL (ref 8.9–10.3)
Chloride: 104 mmol/L (ref 98–111)
Creatinine: 0.94 mg/dL (ref 0.61–1.24)
GFR, Est AFR Am: >60 mL/min (ref >60)
GFR, Estimated: >60 mL/min (ref >60)
Glucose, Bld: 105 mg/dL — ABNORMAL HIGH (ref 70–99)
Potassium: 4 mmol/L (ref 3.5–5.1)
Sodium: 140 mmol/L (ref 135–145)
Total Bilirubin: 0.6 mg/dL (ref 0.3–1.2)
Total Protein: 7.2 g/dL (ref 6.5–8.1)

## 2019-05-31 ENCOUNTER — Ambulatory Visit (HOSPITAL_COMMUNITY)
Admission: RE | Admit: 2019-05-31 | Discharge: 2019-05-31 | Disposition: A | Discharge status: Home / Self Care | Payer: Medicare Other | Source: Ambulatory Visit | Attending: Internal Medicine | Admitting: Internal Medicine

## 2019-05-31 DIAGNOSIS — C3490 Malignant neoplasm of unspecified part of unspecified bronchus or lung: Secondary | ICD-10-CM | POA: Insufficient documentation

## 2019-05-31 DIAGNOSIS — K8020 Calculus of gallbladder without cholecystitis without obstruction: Secondary | ICD-10-CM | POA: Diagnosis not present

## 2019-05-31 DIAGNOSIS — R918 Other nonspecific abnormal finding of lung field: Secondary | ICD-10-CM | POA: Diagnosis not present

## 2019-05-31 MED ORDER — IOHEXOL 300 MG/ML  SOLN
100.0000 mL | Freq: Once | INTRAMUSCULAR | Status: AC | PRN
  Administered 2019-05-31: 100 mL via INTRAVENOUS

## 2019-05-31 MED ORDER — SODIUM CHLORIDE (PF) 0.9 % IJ SOLN
INTRAMUSCULAR | Status: AC
  Filled 2019-05-31: qty 50

## 2019-06-03 ENCOUNTER — Other Ambulatory Visit: Payer: Self-pay

## 2019-06-03 ENCOUNTER — Encounter: Payer: Self-pay | Admitting: Internal Medicine

## 2019-06-03 ENCOUNTER — Telehealth: Payer: Self-pay | Admitting: Medical Oncology

## 2019-06-03 ENCOUNTER — Inpatient Hospital Stay: Payer: Medicare Other | Attending: Internal Medicine | Admitting: Internal Medicine

## 2019-06-03 ENCOUNTER — Other Ambulatory Visit: Payer: Self-pay | Admitting: Medical Oncology

## 2019-06-03 ENCOUNTER — Telehealth: Payer: Self-pay | Admitting: Internal Medicine

## 2019-06-03 VITALS — BP 130/81 | HR 64 | Temp 98.5°F | Resp 18 | Ht 72.0 in | Wt 233.0 lb

## 2019-06-03 DIAGNOSIS — Z9221 Personal history of antineoplastic chemotherapy: Secondary | ICD-10-CM | POA: Insufficient documentation

## 2019-06-03 DIAGNOSIS — Z79899 Other long term (current) drug therapy: Secondary | ICD-10-CM | POA: Insufficient documentation

## 2019-06-03 DIAGNOSIS — I10 Essential (primary) hypertension: Secondary | ICD-10-CM | POA: Insufficient documentation

## 2019-06-03 DIAGNOSIS — C3431 Malignant neoplasm of lower lobe, right bronchus or lung: Secondary | ICD-10-CM | POA: Diagnosis not present

## 2019-06-03 DIAGNOSIS — Z5111 Encounter for antineoplastic chemotherapy: Secondary | ICD-10-CM | POA: Diagnosis not present

## 2019-06-03 DIAGNOSIS — E039 Hypothyroidism, unspecified: Secondary | ICD-10-CM

## 2019-06-03 MED ORDER — ERLOTINIB HCL 100 MG PO TABS: 100.0000 mg | ORAL_TABLET | Freq: Every day | ORAL | 3 refills | Status: DC

## 2019-06-03 NOTE — Progress Notes (Signed)
[place] [phone]   [phone]  OFFICE PROGRESS NOTE  [name], MD 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  PRIOR THERAPY: 1) Status post right upper lobectomy under the care of Dr. Roxan Hockey on 01/23/2006.  2) Tarceva 150 mg by mouth daily as part of the BMS checkmate 370 clinical trial. Status post 6 weeks of treatment.  CURRENT THERAPY: Tarceva 100 mg by mouth daily as part of the BMS Checkmate 370 clinical trial. First dose started 07/17/2014 status post 58 months of treatment.  INTERVAL HISTORY: [name] 74 y.o. male returns to the clinic today for follow-up visit.  The patient is feeling fine today with no concerning complaints.  He denied having any current chest pain, shortness of breath, cough or hemoptysis.  He denied having any fever or chills.  He has no nausea, vomiting, diarrhea or constipation.  He has no rash.  He denied having any recent weight loss or night sweats.  He has no headache or visual changes.  He continues to tolerate his treatment with Tarceva fairly well.  The patient is here today for evaluation and repeat CT scan of the chest, abdomen pelvis for restaging of his disease.  MEDICAL HISTORY: Past Medical History:  Diagnosis Date  . Cholelithiases 07/16/2015  . Complication of anesthesia 2007   quit breathing with bronchoscopy, had to spend night  . History of agent Orange exposure 44-45 yrs ago  . History of pneumonia  last 3-4 yrs ago   3 -4 different times  . Hypercholesterolemia   . Hypertension   . Hypothyroidism   . lung ca dx'd 2007   lung right  . Sinus disease    hx of  . Sleep apnea    uses cpap setting opf 12    ALLERGIES:  is allergic to lisinopril and prednisone.  MEDICATIONS:  Current  Outpatient Medications  Medication Sig Dispense Refill  . amitriptyline (ELAVIL) 50 MG tablet Take 50 mg by mouth at bedtime. Reported on 06/18/2015    . aspirin 81 MG tablet Take 81 mg by mouth every morning.     . Cholecalciferol 2000 units CAPS Take 2,000 Units by mouth daily.     . clindamycin (CLEOCIN T) 1 % external solution APPLY TO AFFECTED AREA(S) TWO TIMES A DAY AS NEEDED FOR SKIN IRRITATION 30 mL 0  . diltiazem (TIAZAC) 180 MG 24 hr capsule TAKE ONE CAPSULE BY MOUTH DAILY 90 capsule 3  . erlotinib (TARCEVA) 100 MG tablet Take 1 tablet (100 mg total) by mouth daily. Take on an empty stomach 1 hour before meals or 2 hours after.faxed to [facility] 90 tablet 3  . famotidine (PEPCID) 20 MG tablet Take 1 tablet (20 mg total) by mouth 2 (two) times daily. 60 tablet 0  . levETIRAcetam (KEPPRA) 750 MG tablet Take 750 mg by mouth at bedtime.     . metoprolol succinate (TOPROL-XL) 50 MG 24 hr tablet Take 1 tablet (50 mg total) by mouth daily. Take with or immediately following a meal. 90 tablet 3  . Multiple Vitamins-Minerals (CENTRUM SILVER PO) Take 1 tablet by mouth daily.     . nitroGLYCERIN (NITROSTAT) 0.4 MG SL tablet Place 1 tablet (0.4 mg total) under the tongue every 5 (five) minutes as needed for chest  pain. Reported on 10/22/2015 30 tablet 11  . polyethylene glycol (MIRALAX / GLYCOLAX) packet Take 17 g by mouth daily. Reported on 08/27/2015    . psyllium (METAMUCIL) 58.6 % powder Take 1 packet by mouth daily. Reported on 08/27/2015    . rosuvastatin (CRESTOR) 5 MG tablet Take 5 mg by mouth every morning.     [name] SYNTHROID 112 MCG tablet Take 112 mcg by mouth daily.    . valsartan-hydrochlorothiazide (DIOVAN-HCT) 320-25 MG per tablet Take 1 tablet by mouth every morning.      No current facility-administered medications for this visit.    SURGICAL HISTORY:  Past Surgical History:  Procedure Laterality Date  . BIOPSY  05/21/2018   Procedure: BIOPSY;  Surgeon: Wonda Horner, MD;  Location:  [facility];  Service: Endoscopy;;  . COLONOSCOPY WITH PROPOFOL N/A 03/19/2013   Procedure: COLONOSCOPY WITH PROPOFOL;  Surgeon: Garlan Fair, MD;  Location: [facility];  Service: Endoscopy;  Laterality: N/A;  . Cystourethroscopy, [place] TURP.  04/16/2010  . ESOPHAGOGASTRODUODENOSCOPY (EGD) WITH PROPOFOL N/A 05/21/2018   Procedure: ESOPHAGOGASTRODUODENOSCOPY (EGD) WITH PROPOFOL;  Surgeon: Wonda Horner, MD;  Location: [facility];  Service: Endoscopy;  Laterality: N/A;  . NASAL SINUS SURGERY  1987  . Right video-assisted thoracoscopy, right upper lobectomy and  01/23/2006  . The Olympus video bronchoscope was introduced via the right  12/21/2005  . TONSILLECTOMY  1957  . Torn medial and lateral menisci, left knee.  11/06/2006    REVIEW OF SYSTEMS:  Constitutional: negative Eyes: negative Ears, nose, mouth, throat, and face: negative Respiratory: negative Cardiovascular: negative Gastrointestinal: negative Genitourinary:negative Integument/breast: negative Hematologic/lymphatic: negative Musculoskeletal:negative Neurological: negative Behavioral/Psych: negative Endocrine: negative Allergic/Immunologic: negative   PHYSICAL EXAMINATION: General appearance: alert, cooperative and no distress Head: Normocephalic, without obvious abnormality, atraumatic Neck: no adenopathy, no JVD, supple, symmetrical, trachea midline and thyroid not enlarged, symmetric, no tenderness/mass/nodules Lymph nodes: Cervical, supraclavicular, and axillary nodes normal. Resp: clear to auscultation bilaterally Back: symmetric, no curvature. ROM normal. No CVA tenderness. Cardio: regular rate and rhythm, S1, S2 normal, no murmur, click, rub or gallop GI: soft, non-tender; bowel sounds normal; no masses,  no organomegaly Extremities: extremities normal, atraumatic, no cyanosis or edema Neurologic: Alert and oriented X 3, normal strength and tone. Normal symmetric reflexes. Normal coordination and  gait  ECOG PERFORMANCE STATUS: 1 - Symptomatic but completely ambulatory  Blood pressure 130/81, pulse 64, temperature 98.5 F (36.9 C), temperature source Temporal, resp. rate 18, height 6' (1.829 m), weight 233 lb (105.7 kg), SpO2 98 %.  LABORATORY DATA: Lab Results  Component Value Date   WBC 5.8 05/30/2019   HGB 14.6 05/30/2019   HCT 41.8 05/30/2019   MCV 101.2 (H) 05/30/2019   PLT 186 05/30/2019      Chemistry      Component Value Date/Time   NA 140 05/30/2019 0902   NA 141 03/11/2019 0959   NA 139 04/06/2017 0932   K 4.0 05/30/2019 0902   K 3.8 04/06/2017 0932   CL 104 05/30/2019 0902   CL 102 04/24/2012 0853   CO2 26 05/30/2019 0902   CO2 29 04/06/2017 0932   BUN 15 05/30/2019 0902   BUN 18 03/11/2019 0959   BUN 14.4 04/06/2017 0932   CREATININE 0.94 05/30/2019 0902   CREATININE 1.0 04/06/2017 0932      Component Value Date/Time   CALCIUM 9.5 05/30/2019 0902   CALCIUM 9.5 04/06/2017 0932   ALKPHOS 65 05/30/2019 0902   ALKPHOS 58 04/06/2017 0932  AST 36 05/30/2019 0902   AST 27 04/06/2017 0932   ALT 37 05/30/2019 0902   ALT 27 04/06/2017 0932   BILITOT 0.6 05/30/2019 0902   BILITOT 0.45 04/06/2017 0932       RADIOGRAPHIC STUDIES: CT Chest W Contrast  Result Date: 05/31/2019 CLINICAL DATA:  Lung cancer diagnosed in 2007 with recurrence in 2015. Oral chemotherapy ongoing. Right upper lobectomy. No current complaints. EXAM: CT CHEST, ABDOMEN, AND PELVIS WITH CONTRAST TECHNIQUE: Multidetector CT imaging of the chest, abdomen and pelvis was performed following the standard protocol during bolus administration of intravenous contrast. CONTRAST:  172m OMNIPAQUE IOHEXOL 300 MG/ML  SOLN COMPARISON:  01/29/2019. FINDINGS: CT CHEST FINDINGS Cardiovascular: Aortic atherosclerosis. Tortuous thoracic aorta. Normal heart size, without pericardial effusion. No central pulmonary embolism, on this non-dedicated study. Mediastinum/Nodes: No supraclavicular adenopathy. No  mediastinal or hilar adenopathy. Lungs/Pleura: No pleural fluid. Right hemidiaphragm elevation. Right upper lobectomy. Mild centrilobular emphysema. Right lower lobe pulmonary nodule measures 2.0 x 1.2 cm on 66/4. Compare 2.2 x 1.2 cm on the prior exam (when remeasured). This suggests stability. Left upper lobe 2 mm pulmonary nodule on 52/4, similar. 2 mm anterior right lower lobe pulmonary nodule on 74/4 is unchanged and likely calcified. Medial right lower lobe nodule measures 1.6 x 1.1 cm on 46/4 and is similar to on the prior exam (when remeasured). Musculoskeletal: No acute osseous abnormality. CT ABDOMEN PELVIS FINDINGS Hepatobiliary: Normal liver. 2.2 cm gallstone without acute cholecystitis or biliary duct dilatation. Pancreas: Normal, without mass or ductal dilatation. Spleen: Normal in size, without focal abnormality. Adrenals/Urinary Tract: Normal adrenal glands. 2.2 cm upper pole right renal cyst. Normal left kidney, without hydronephrosis. Stomach/Bowel: Normal stomach, without wall thickening. Scattered colonic diverticula. Colonic stool burden suggests constipation. Normal terminal ileum and appendix. Normal small bowel. Vascular/Lymphatic: Aortic atherosclerosis. No abdominopelvic adenopathy. Reproductive: Normal prostate. Other: No significant free fluid. tiny fat containing left inguinal hernia. Subcutaneous nodule about the anterior abdominal wall at 4 mm on 86/2, similar and nonspecific. Musculoskeletal: Lumbar spondylosis. IMPRESSION: 1. Status post right upper lobectomy. 2. Similar right-sided pulmonary nodules/metastasis. 3. No thoracic adenopathy. 4. No acute process or evidence of metastatic disease in the abdomen or pelvis. 5. Cholelithiasis. 6. Aortic atherosclerosis (ICD10-I70.0) and emphysema (ICD10-J43.9). Electronically Signed   By: KAbigail MiyamotoM.D.   On: 05/31/2019 10:16   CT Abdomen Pelvis W Contrast  Result Date: 05/31/2019 CLINICAL DATA:  Lung cancer diagnosed in 2007 with  recurrence in 2015. Oral chemotherapy ongoing. Right upper lobectomy. No current complaints. EXAM: CT CHEST, ABDOMEN, AND PELVIS WITH CONTRAST TECHNIQUE: Multidetector CT imaging of the chest, abdomen and pelvis was performed following the standard protocol during bolus administration of intravenous contrast. CONTRAST:  109mOMNIPAQUE IOHEXOL 300 MG/ML  SOLN COMPARISON:  01/29/2019. FINDINGS: CT CHEST FINDINGS Cardiovascular: Aortic atherosclerosis. Tortuous thoracic aorta. Normal heart size, without pericardial effusion. No central pulmonary embolism, on this non-dedicated study. Mediastinum/Nodes: No supraclavicular adenopathy. No mediastinal or hilar adenopathy. Lungs/Pleura: No pleural fluid. Right hemidiaphragm elevation. Right upper lobectomy. Mild centrilobular emphysema. Right lower lobe pulmonary nodule measures 2.0 x 1.2 cm on 66/4. Compare 2.2 x 1.2 cm on the prior exam (when remeasured). This suggests stability. Left upper lobe 2 mm pulmonary nodule on 52/4, similar. 2 mm anterior right lower lobe pulmonary nodule on 74/4 is unchanged and likely calcified. Medial right lower lobe nodule measures 1.6 x 1.1 cm on 46/4 and is similar to on the prior exam (when remeasured). Musculoskeletal: No acute osseous abnormality. CT ABDOMEN  PELVIS FINDINGS Hepatobiliary: Normal liver. 2.2 cm gallstone without acute cholecystitis or biliary duct dilatation. Pancreas: Normal, without mass or ductal dilatation. Spleen: Normal in size, without focal abnormality. Adrenals/Urinary Tract: Normal adrenal glands. 2.2 cm upper pole right renal cyst. Normal left kidney, without hydronephrosis. Stomach/Bowel: Normal stomach, without wall thickening. Scattered colonic diverticula. Colonic stool burden suggests constipation. Normal terminal ileum and appendix. Normal small bowel. Vascular/Lymphatic: Aortic atherosclerosis. No abdominopelvic adenopathy. Reproductive: Normal prostate. Other: No significant free fluid. tiny fat  containing left inguinal hernia. Subcutaneous nodule about the anterior abdominal wall at 4 mm on 86/2, similar and nonspecific. Musculoskeletal: Lumbar spondylosis. IMPRESSION: 1. Status post right upper lobectomy. 2. Similar right-sided pulmonary nodules/metastasis. 3. No thoracic adenopathy. 4. No acute process or evidence of metastatic disease in the abdomen or pelvis. 5. Cholelithiasis. 6. Aortic atherosclerosis (ICD10-I70.0) and emphysema (ICD10-J43.9). Electronically Signed   By: Abigail Miyamoto M.D.   On: 05/31/2019 10:16   CT CORONARY MORPH W/CTA COR W/SCORE W/CA W/CM &/OR WO/CM  Addendum Date: 05/14/2019   ADDENDUM REPORT: 05/14/2019 13:02 CLINICAL DATA:  74 yo male with exertional chest pain EXAM: Cardiac/Coronary  CT TECHNIQUE: The patient was scanned on a [employer]. FINDINGS: A 120 kV prospective scan was triggered in the descending thoracic aorta at 111 HU's. Axial non-contrast 3 mm slices were carried out through the heart. The data set was analyzed on a dedicated work station and scored using the [place]. Gantry rotation speed was 250 msecs and collimation was .6 mm. No beta blockade and 0.8 mg of sl NTG was given. The 3D data set was reconstructed in 5% intervals of the 67-82 % of the R-R cycle. Diastolic phases were analyzed on a dedicated work station using MPR, MIP and VRT modes. The patient received 80 cc of contrast. 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 Mild atherosclerosis. No dissection. Aortic Valve:  Trileaflet.  No calcifications. Coronary Arteries:  Normal coronary origin.  Right dominance. RCA is a large dominant artery that gives rise to PDA and PLVB. There is no plaque. Left main is a large artery that gives rise to LAD and LCX arteries. LAD is a large vessel that gives rise to large D1, smaller D2 and very small D3; there is no plaque. LCX is a non-dominant artery that gives rise to OM1 and smaller OM2. There is no plaque. Other findings: Normal pulmonary  vein drainage into the left atrium. Normal let atrial appendage without a thrombus. Normal size of the pulmonary artery. IMPRESSION: 1. Coronary calcium score of 0. This was 0 percentile for age and sex matched control. 2. Normal coronary origin with right dominance. 3. No evidence of CAD; CADRADS-0. 4. 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 Kirk Ruths Electronically Signed   By: Kirk Ruths M.D.   On: 05/14/2019 13:02   Result Date: 05/14/2019 EXAM: OVER-READ INTERPRETATION  CT CHEST The following report is an over-read performed by radiologist Dr. Markus Daft of [facility], [place] on 05/14/2019. This over-read does not include interpretation of cardiac or coronary anatomy or pathology. The coronary calcium score/coronary CTA interpretation by the cardiologist is attached. COMPARISON:  Chest CT 01/29/2019 FINDINGS: Vascular: Mild enlargement of the ascending thoracic aorta measuring up to 3.9 cm and stable. No evidence for a dissection in the visualized thoracic aorta. The descending thoracic aorta measures up to 3.1 cm. Heart size is grossly stable. No significant pericardial fluid. Pulmonary arteries are not well opacified on this examination. Mediastinum/Nodes: Mediastinal structures are unremarkable. No  significant lymph node enlargement. Lungs/Pleura: Lobulated peripheral lesion in the right lower lobe on sequence 12 image 2 measures 2.3 x 1.3 cm and measured 2.3 x 1.2 cm on previous examination. Lesion is incompletely imaged on this examination. No large pleural effusions.Visualized left lung clear. Upper Abdomen: Again noted is elevation of the right hemidiaphragm. There is a large calcified gallstone and that measures up to 2.7 cm. Musculoskeletal: No acute bone abnormality. IMPRESSION: 1. 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 2. Partially visualized lesion in the right lower lobe. Lesion has minimally changed since 01/29/2019 but incompletely evaluated on this  examination. 3. Cholelithiasis. Electronically Signed: By: Markus Daft M.D. On: 05/14/2019 11:36    ASSESSMENT AND PLAN:  This is a very pleasant 74 years old white male with recurrent non-small cell lung cancer, adenocarcinoma and positive EGFR mutation in exon 21. He is currently on Tarceva 100 mg by mouth daily status post more than 58 months of treatment. The patient continues to tolerate his treatment well with no concerning adverse effects. He had repeat CT scan of the chest, abdomen pelvis performed recently.  I personally and independently reviewed the scans and discussed the results with the patient today. His scan showed no concerning findings for disease progression. I recommended for the patient to continue his current treatment with Tarceva with the same dose. I will see him back for follow-up visit in 2 months for evaluation and repeat blood work. For hypertension he will continue with his current blood pressure medications. The patient was advised to call immediately if he has any concerning symptoms in the interval. The patient voices understanding of current disease status and treatment options and is in agreement with the current care plan. All questions were answered. The patient knows to call the clinic with any problems, questions or concerns. We can certainly see the patient much sooner if necessary.  Disclaimer: This note was dictated with voice recognition software. Similar sounding words can inadvertently be transcribed and may not be corrected upon review.

## 2019-06-03 NOTE — Telephone Encounter (Signed)
Escribed and faxed tarceva refills  to [facility].

## 2019-06-03 NOTE — Telephone Encounter (Signed)
Scheduled fu per 3/1 los

## 2019-06-13 DIAGNOSIS — H25812 Combined forms of age-related cataract, left eye: Secondary | ICD-10-CM | POA: Diagnosis not present

## 2019-06-13 DIAGNOSIS — H21562 Pupillary abnormality, left eye: Secondary | ICD-10-CM | POA: Diagnosis not present

## 2019-06-13 DIAGNOSIS — H2512 Age-related nuclear cataract, left eye: Secondary | ICD-10-CM | POA: Diagnosis not present

## 2019-06-19 ENCOUNTER — Ambulatory Visit: Payer: Medicare Other | Attending: Internal Medicine

## 2019-06-19 DIAGNOSIS — Z23 Encounter for immunization: Secondary | ICD-10-CM

## 2019-06-19 NOTE — Progress Notes (Signed)
   Covid-19 Vaccination Clinic  Name:  [name]    MRN: [id number] DOB: 1945-06-26  06/19/2019  [name] was observed post Covid-19 immunization for 15 minutes without incident. He was provided with Vaccine Information Sheet and instruction to access the V-Safe system.   [name] was instructed to call 911 with any severe reactions post vaccine: [name] Difficulty breathing  . Swelling of face and throat  . A fast heartbeat  . A bad rash all over body  . Dizziness and weakness   Immunizations Administered    Name Date Dose VIS Date Route   Pfizer COVID-19 Vaccine 06/19/2019  2:13 PM 0.3 mL 03/15/2019 Intramuscular   Manufacturer: [place]   Lot: [id number]   [place]: [id number]

## 2019-07-03 DIAGNOSIS — C3490 Malignant neoplasm of unspecified part of unspecified bronchus or lung: Secondary | ICD-10-CM | POA: Diagnosis not present

## 2019-07-03 DIAGNOSIS — N400 Enlarged prostate without lower urinary tract symptoms: Secondary | ICD-10-CM | POA: Diagnosis not present

## 2019-07-03 DIAGNOSIS — D649 Anemia, unspecified: Secondary | ICD-10-CM | POA: Diagnosis not present

## 2019-07-03 DIAGNOSIS — E782 Mixed hyperlipidemia: Secondary | ICD-10-CM | POA: Diagnosis not present

## 2019-07-03 DIAGNOSIS — I25110 Atherosclerotic heart disease of native coronary artery with unstable angina pectoris: Secondary | ICD-10-CM | POA: Diagnosis not present

## 2019-07-03 DIAGNOSIS — E785 Hyperlipidemia, unspecified: Secondary | ICD-10-CM | POA: Diagnosis not present

## 2019-07-03 DIAGNOSIS — I208 Other forms of angina pectoris: Secondary | ICD-10-CM | POA: Diagnosis not present

## 2019-07-03 DIAGNOSIS — G43009 Migraine without aura, not intractable, without status migrainosus: Secondary | ICD-10-CM | POA: Diagnosis not present

## 2019-07-03 DIAGNOSIS — I10 Essential (primary) hypertension: Secondary | ICD-10-CM | POA: Diagnosis not present

## 2019-07-03 DIAGNOSIS — E039 Hypothyroidism, unspecified: Secondary | ICD-10-CM | POA: Diagnosis not present

## 2019-07-11 ENCOUNTER — Other Ambulatory Visit: Payer: Self-pay | Admitting: Gastroenterology

## 2019-07-11 DIAGNOSIS — Z86010 Personal history of colonic polyps: Secondary | ICD-10-CM | POA: Diagnosis not present

## 2019-07-25 DIAGNOSIS — H25811 Combined forms of age-related cataract, right eye: Secondary | ICD-10-CM | POA: Diagnosis not present

## 2019-07-25 DIAGNOSIS — H21561 Pupillary abnormality, right eye: Secondary | ICD-10-CM | POA: Diagnosis not present

## 2019-07-25 DIAGNOSIS — H2511 Age-related nuclear cataract, right eye: Secondary | ICD-10-CM | POA: Diagnosis not present

## 2019-07-27 DIAGNOSIS — E785 Hyperlipidemia, unspecified: Secondary | ICD-10-CM | POA: Diagnosis not present

## 2019-07-27 DIAGNOSIS — I208 Other forms of angina pectoris: Secondary | ICD-10-CM | POA: Diagnosis not present

## 2019-07-27 DIAGNOSIS — I10 Essential (primary) hypertension: Secondary | ICD-10-CM | POA: Diagnosis not present

## 2019-07-27 DIAGNOSIS — D649 Anemia, unspecified: Secondary | ICD-10-CM | POA: Diagnosis not present

## 2019-07-27 DIAGNOSIS — E039 Hypothyroidism, unspecified: Secondary | ICD-10-CM | POA: Diagnosis not present

## 2019-07-27 DIAGNOSIS — I25110 Atherosclerotic heart disease of native coronary artery with unstable angina pectoris: Secondary | ICD-10-CM | POA: Diagnosis not present

## 2019-07-27 DIAGNOSIS — N400 Enlarged prostate without lower urinary tract symptoms: Secondary | ICD-10-CM | POA: Diagnosis not present

## 2019-07-27 DIAGNOSIS — C3490 Malignant neoplasm of unspecified part of unspecified bronchus or lung: Secondary | ICD-10-CM | POA: Diagnosis not present

## 2019-07-27 DIAGNOSIS — E782 Mixed hyperlipidemia: Secondary | ICD-10-CM | POA: Diagnosis not present

## 2019-07-27 DIAGNOSIS — G43009 Migraine without aura, not intractable, without status migrainosus: Secondary | ICD-10-CM | POA: Diagnosis not present

## 2019-07-30 ENCOUNTER — Ambulatory Visit (INDEPENDENT_AMBULATORY_CARE_PROVIDER_SITE_OTHER): Payer: Medicare Other | Admitting: Interventional Cardiology

## 2019-07-30 ENCOUNTER — Encounter: Payer: Self-pay | Admitting: Interventional Cardiology

## 2019-07-30 ENCOUNTER — Other Ambulatory Visit: Payer: Self-pay

## 2019-07-30 VITALS — BP 152/82 | HR 57 | Ht 72.0 in | Wt 229.6 lb

## 2019-07-30 DIAGNOSIS — I440 Atrioventricular block, first degree: Secondary | ICD-10-CM

## 2019-07-30 DIAGNOSIS — I209 Angina pectoris, unspecified: Secondary | ICD-10-CM | POA: Diagnosis not present

## 2019-07-30 DIAGNOSIS — C3431 Malignant neoplasm of lower lobe, right bronchus or lung: Secondary | ICD-10-CM | POA: Diagnosis not present

## 2019-07-30 DIAGNOSIS — G4730 Sleep apnea, unspecified: Secondary | ICD-10-CM | POA: Diagnosis not present

## 2019-07-30 DIAGNOSIS — I10 Essential (primary) hypertension: Secondary | ICD-10-CM | POA: Diagnosis not present

## 2019-07-30 DIAGNOSIS — E785 Hyperlipidemia, unspecified: Secondary | ICD-10-CM | POA: Diagnosis not present

## 2019-07-30 DIAGNOSIS — Z7189 Other specified counseling: Secondary | ICD-10-CM | POA: Diagnosis not present

## 2019-07-30 DIAGNOSIS — L97319 Non-pressure chronic ulcer of right ankle with unspecified severity: Secondary | ICD-10-CM

## 2019-07-30 NOTE — Patient Instructions (Signed)
Medication Instructions:  Your physician recommends that you continue on your current medications as directed. Please refer to the Current Medication list given to you today.  *If you need a refill on your cardiac medications before your next appointment, please call your pharmacy*   Lab Work: None If you have labs (blood work) drawn today and your tests are completely normal, you will receive your results only by: [name] MyChart Message (if you have MyChart) OR . A paper copy in the mail If you have any lab test that is abnormal or we need to change your treatment, we will call you to review the results.   Testing/Procedures: None   Follow-Up: At [facility], you and your health needs are our priority.  As part of our continuing mission to provide you with exceptional heart care, we have created designated Provider Care Teams.  These Care Teams include your primary Cardiologist (physician) and Advanced Practice Providers (APPs -  Physician Assistants and Nurse Practitioners) who all work together to provide you with the care you need, when you need it.  We recommend signing up for the patient portal called "MyChart".  Sign up information is provided on this After Visit Summary.  MyChart is used to connect with patients for Virtual Visits (Telemedicine).  Patients are able to view lab/test results, encounter notes, upcoming appointments, etc.  Non-urgent messages can be sent to your provider as well.   To learn more about what you can do with MyChart, go to [email or web address].    Your next appointment:   1 year(s)  The format for your next appointment:   In Person  Provider:   You may see Sinclair Grooms, MD or one of the following Advanced Practice Providers on your designated Care Team:    Truitt Merle, NP  Cecilie Kicks, NP  Kathyrn Drown, NP    Other Instructions  Your physician recommends that you follow up with your Primary Care Physician about the wound on your  leg.

## 2019-07-30 NOTE — Progress Notes (Addendum)
Cardiology Office Note:    Date:  07/30/2019   ID:  [name], DOB [birth year], MRN [id number]  PCP:  [name], MD  Cardiologist:  Sinclair Grooms, MD   Referring MD: [name], MD   Chief Complaint  Patient presents with  . Coronary Artery Disease  . Chest Pain    History of Present Illness:    [name] is a 74 y.o. male with a hx of  non-small cell lung cancer (adenocarcinoma) 2007, right upper lobectomy 2007, Sleep apnea, essential hypertension, hyperlipidemia, now being evaluated for exertional chest pain.  No significant change in anginal pattern.  Uses nitro on occasion.  Perhaps he has microvascular disease.  Never any identification of significant obstructive disease.  Coronary CT done in February was unremarkable.  Mildly dilated aorta was noted.  Past Medical History:  Diagnosis Date  . Cholelithiases 07/16/2015  . Complication of anesthesia 2007   quit breathing with bronchoscopy, had to spend night  . History of agent Orange exposure 44-45 yrs ago  . History of pneumonia  last 3-4 yrs ago   3 -4 different times  . Hypercholesterolemia   . Hypertension   . Hypothyroidism   . lung ca dx'd 2007   lung right  . Sinus disease    hx of  . Sleep apnea    uses cpap setting opf 12    Past Surgical History:  Procedure Laterality Date  . BIOPSY  05/21/2018   Procedure: BIOPSY;  Surgeon: Wonda Horner, MD;  Location: [facility];  Service: Endoscopy;;  . COLONOSCOPY WITH PROPOFOL N/A 03/19/2013   Procedure: COLONOSCOPY WITH PROPOFOL;  Surgeon: Garlan Fair, MD;  Location: [facility];  Service: Endoscopy;  Laterality: N/A;  . Cystourethroscopy, [facility] TURP.  04/16/2010  . ESOPHAGOGASTRODUODENOSCOPY (EGD) WITH PROPOFOL N/A 05/21/2018   Procedure: ESOPHAGOGASTRODUODENOSCOPY (EGD) WITH PROPOFOL;  Surgeon: Wonda Horner, MD;  Location: [facility];  Service: Endoscopy;  Laterality: N/A;  . NASAL SINUS SURGERY  1987  . Right video-assisted  thoracoscopy, right upper lobectomy and  01/23/2006  . The Olympus video bronchoscope was introduced via the right  12/21/2005  . TONSILLECTOMY  1957  . Torn medial and lateral menisci, left knee.  11/06/2006    Current Medications: Current Meds  Medication Sig  . amitriptyline (ELAVIL) 50 MG tablet Take 50 mg by mouth at bedtime. Reported on 06/18/2015  . aspirin 81 MG tablet Take 81 mg by mouth every morning.   . Cholecalciferol 2000 units CAPS Take 2,000 Units by mouth daily.   . clindamycin (CLEOCIN T) 1 % external solution APPLY TO AFFECTED AREA(S) TWO TIMES A DAY AS NEEDED FOR SKIN IRRITATION  . diltiazem (TIAZAC) 180 MG 24 hr capsule TAKE ONE CAPSULE BY MOUTH DAILY  . erlotinib (TARCEVA) 100 MG tablet Take 1 tablet (100 mg total) by mouth daily. Take on an empty stomach 1 hour before meals or 2 hours after.faxed to [facility]  . levETIRAcetam (KEPPRA) 750 MG tablet Take 750 mg by mouth at bedtime.   . metoprolol succinate (TOPROL-XL) 50 MG 24 hr tablet Take 1 tablet (50 mg total) by mouth daily. Take with or immediately following a meal.  . moxifloxacin (VIGAMOX) 0.5 % ophthalmic solution   . Multiple Vitamins-Minerals (CENTRUM SILVER PO) Take 1 tablet by mouth daily.   . nitroGLYCERIN (NITROSTAT) 0.4 MG SL tablet Place 1 tablet (0.4 mg total) under the tongue every 5 (five) minutes as needed for chest pain. Reported on  10/22/2015  . polyethylene glycol (MIRALAX / GLYCOLAX) packet Take 17 g by mouth daily. Reported on 08/27/2015  . prednisoLONE acetate (PRED FORTE) 1 % ophthalmic suspension   . psyllium (METAMUCIL) 58.6 % powder Take 1 packet by mouth daily. Reported on 08/27/2015  . rosuvastatin (CRESTOR) 5 MG tablet Take 5 mg by mouth every morning.   [name] SYNTHROID 112 MCG tablet Take 112 mcg by mouth daily.  . valsartan-hydrochlorothiazide (DIOVAN-HCT) 320-25 MG per tablet Take 1 tablet by mouth every morning.      Allergies:   Lisinopril and Prednisone   Social History    Socioeconomic History  . Marital status: Married    Spouse name: Not on file  . Number of children: 0  . Years of education: Not on file  . Highest education level: Not on file  Occupational History  . Occupation: Administrator    Comment: Retired  Tobacco Use  . Smoking status: Never Smoker  . Smokeless tobacco: Never Used  Substance and Sexual Activity  . Alcohol use: No    Alcohol/week: 0.0 standard drinks  . Drug use: No  . Sexual activity: Not Currently  Other Topics Concern  . Not on file  Social History Narrative  . Not on file   Social Determinants of Health   Financial Resource Strain:   . Difficulty of Paying Living Expenses:   Food Insecurity:   . Worried About Charity fundraiser in the Last Year:   . Arboriculturist in the Last Year:   Transportation Needs:   . Film/video editor (Medical):   [name] Lack of Transportation (Non-Medical):   Physical Activity:   . Days of Exercise per Week:   . Minutes of Exercise per Session:   Stress:   . Feeling of Stress :   Social Connections:   . Frequency of Communication with Friends and Family:   . Frequency of Social Gatherings with Friends and Family:   . Attends Religious Services:   . Active Member of Clubs or Organizations:   . Attends Archivist Meetings:   [name] Marital Status:      Family History: The patient's family history includes Heart attack in his brother and father; Hypertension in his mother.  ROS:   Please see the history of present illness.    Nonhealing superficial right ankle abrasion/skin lesion that has been present for up to 6 to 8 weeks.  No fever or chills.  All other systems reviewed and are negative.  EKGs/Labs/Other Studies Reviewed:    The following studies were reviewed today:  Coronary CT angiography with FFR if needed 05/14/2019: IMPRESSION: 1. Coronary calcium score of 0. This was 0 percentile for age and sex matched control.  2. Normal coronary origin with  right dominance.  3. No evidence of CAD; CADRADS-0.  4. 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  [facility]  EKG:  EKG sinus bradycardia at 57 bpm.  PR interval 226 ms.  EKG is otherwise normal.  Recent Labs: 05/30/2019: ALT 37; BUN 15; Creatinine 0.94; Hemoglobin 14.6; Platelet Count 186; Potassium 4.0; Sodium 140  Recent Lipid Panel No results found for: CHOL, TRIG, HDL, CHOLHDL, VLDL, LDLCALC, LDLDIRECT  Physical Exam:    VS:  BP (!) 152/82   Pulse (!) 57   Ht 6' (1.829 m)   Wt 229 lb 9.6 oz (104.1 kg)   SpO2 97%   BMI 31.14 kg/m     Wt Readings from Last 3 Encounters:  07/30/19 229 lb 9.6 oz (104.1 kg)  06/03/19 233 lb (105.7 kg)  04/02/19 229 lb 3.2 oz (104 kg)     GEN: Mildly overweight. No acute distress HEENT: Normal NECK: No JVD. LYMPHATICS: No lymphadenopathy CARDIAC: S4 gallop RRR without murmur, S3 gallop gallop, or edema. VASCULAR:  Normal Pulses. No bruits. RESPIRATORY:  Clear to auscultation without rales, wheezing or rhonchi  ABDOMEN: Soft, non-tender, non-distended, No pulsatile mass, MUSCULOSKELETAL: No deformity  SKIN: Warm and dry NEUROLOGIC:  Alert and oriented x 3 PSYCHIATRIC:  Normal affect   ASSESSMENT:    1. Angina pectoris ([place])   2. Essential hypertension   3. Hyperlipidemia LDL goal <70   4. First degree AV block   5. Sleep apnea, unspecified type   6. Malignant neoplasm of lower lobe of right lung ([place])   7. Educated about COVID-19 virus infection   8. Ankle ulcer, right, with unspecified severity ([place])    PLAN:    In order of problems listed above:  1. Not a significant issue.  See CT scan results above. 2. Target 130/80 mmHg.  Repeat blood pressure by me is 132/80 mmHg.  Continue metoprolol, valsartan HCTZ.  In the future may need to decrease the intensity of beta-blocker therapy because of first-degree AV block.  May need to add amlodipine. 3. LDL target less than 70 should be achieved.  Continue low-dose atorvastatin  5 mg/day. 4. Consider decreasing intensity of metoprolol.  PR interval is stable.  Needs close monitoring. 5. Encouraged CPAP. 6. Not discussed 7. Vaccine has been received.  Distancing is being practiced. 8. There is a skin lesion posterior to the right malleolus on the medial aspect of his ankle.  It is not deep.  It appears to be a superficial abrasion.  There are superficial spider veins adjacent.  This needs to be seen by a podiatrist or vascular specialist.  He has a very strong posterior tibial pulse and I do not believe this is related to poor circulation.  I have recommended that he speak to Dr. Inda Merlin about it.  Overall education and awareness concerning primary/secondary risk prevention was discussed in detail: LDL less than 70, hemoglobin A1c less than 7, blood pressure target less than 130/80 mmHg, >150 minutes of moderate aerobic activity per week, avoidance of smoking, weight control (via diet and exercise), and continued surveillance/management of/for obstructive sleep apnea.    Medication Adjustments/Labs and Tests Ordered: Current medicines are reviewed at length with the patient today.  Concerns regarding medicines are outlined above.  Orders Placed This Encounter  Procedures  . EKG 12-Lead   No orders of the defined types were placed in this encounter.   Patient Instructions  Medication Instructions:  Your physician recommends that you continue on your current medications as directed. Please refer to the Current Medication list given to you today.  *If you need a refill on your cardiac medications before your next appointment, please call your pharmacy*   Lab Work: None If you have labs (blood work) drawn today and your tests are completely normal, you will receive your results only by: [name] MyChart Message (if you have MyChart) OR . A paper copy in the mail If you have any lab test that is abnormal or we need to change your treatment, we will call you to review the  results.   Testing/Procedures: None   Follow-Up: At [facility], you and your health needs are our priority.  As part of our continuing mission to provide you  with exceptional heart care, we have created designated Provider Care Teams.  These Care Teams include your primary Cardiologist (physician) and Advanced Practice Providers (APPs -  Physician Assistants and Nurse Practitioners) who all work together to provide you with the care you need, when you need it.  We recommend signing up for the patient portal called "MyChart".  Sign up information is provided on this After Visit Summary.  MyChart is used to connect with patients for Virtual Visits (Telemedicine).  Patients are able to view lab/test results, encounter notes, upcoming appointments, etc.  Non-urgent messages can be sent to your provider as well.   To learn more about what you can do with MyChart, go to [email or web address].    Your next appointment:   1 year(s)  The format for your next appointment:   In Person  Provider:   You may see Sinclair Grooms, MD or one of the following Advanced Practice Providers on your designated Care Team:    Truitt Merle, NP  Cecilie Kicks, NP  Kathyrn Drown, NP    Other Instructions  Your physician recommends that you follow up with your Primary Care Physician about the wound on your leg.     Signed, Sinclair Grooms, MD  07/30/2019 2:59 PM    [place]

## 2019-07-31 ENCOUNTER — Inpatient Hospital Stay: Admission: RE | Admit: 2019-07-31 | Payer: Medicare Other | Source: Ambulatory Visit

## 2019-08-01 ENCOUNTER — Other Ambulatory Visit: Payer: Self-pay | Admitting: Interventional Cardiology

## 2019-08-01 ENCOUNTER — Ambulatory Visit
Admission: RE | Admit: 2019-08-01 | Discharge: 2019-08-01 | Disposition: A | Discharge status: Home / Self Care | Payer: Medicare Other | Source: Ambulatory Visit | Attending: Gastroenterology | Admitting: Gastroenterology

## 2019-08-01 DIAGNOSIS — Z86010 Personal history of colonic polyps: Secondary | ICD-10-CM

## 2019-08-01 DIAGNOSIS — K5730 Diverticulosis of large intestine without perforation or abscess without bleeding: Secondary | ICD-10-CM | POA: Diagnosis not present

## 2019-08-01 DIAGNOSIS — K8020 Calculus of gallbladder without cholecystitis without obstruction: Secondary | ICD-10-CM | POA: Diagnosis not present

## 2019-08-08 ENCOUNTER — Inpatient Hospital Stay: Payer: Medicare Other | Attending: Internal Medicine | Admitting: Internal Medicine

## 2019-08-08 ENCOUNTER — Encounter: Payer: Self-pay | Admitting: Internal Medicine

## 2019-08-08 ENCOUNTER — Other Ambulatory Visit: Payer: Self-pay

## 2019-08-08 ENCOUNTER — Inpatient Hospital Stay: Payer: Medicare Other

## 2019-08-08 ENCOUNTER — Telehealth: Payer: Self-pay | Admitting: Internal Medicine

## 2019-08-08 VITALS — BP 140/87 | HR 67 | Temp 97.8°F | Resp 18 | Ht 73.0 in | Wt 229.0 lb

## 2019-08-08 DIAGNOSIS — Z5111 Encounter for antineoplastic chemotherapy: Secondary | ICD-10-CM | POA: Diagnosis not present

## 2019-08-08 DIAGNOSIS — C3490 Malignant neoplasm of unspecified part of unspecified bronchus or lung: Secondary | ICD-10-CM

## 2019-08-08 DIAGNOSIS — G4730 Sleep apnea, unspecified: Secondary | ICD-10-CM | POA: Diagnosis not present

## 2019-08-08 DIAGNOSIS — E039 Hypothyroidism, unspecified: Secondary | ICD-10-CM | POA: Insufficient documentation

## 2019-08-08 DIAGNOSIS — C3431 Malignant neoplasm of lower lobe, right bronchus or lung: Secondary | ICD-10-CM | POA: Diagnosis not present

## 2019-08-08 DIAGNOSIS — E7800 Pure hypercholesterolemia, unspecified: Secondary | ICD-10-CM | POA: Insufficient documentation

## 2019-08-08 DIAGNOSIS — Z902 Acquired absence of lung [part of]: Secondary | ICD-10-CM | POA: Insufficient documentation

## 2019-08-08 DIAGNOSIS — I209 Angina pectoris, unspecified: Secondary | ICD-10-CM

## 2019-08-08 DIAGNOSIS — C3411 Malignant neoplasm of upper lobe, right bronchus or lung: Secondary | ICD-10-CM | POA: Insufficient documentation

## 2019-08-08 DIAGNOSIS — I10 Essential (primary) hypertension: Secondary | ICD-10-CM | POA: Diagnosis not present

## 2019-08-08 DIAGNOSIS — Z7982 Long term (current) use of aspirin: Secondary | ICD-10-CM | POA: Diagnosis not present

## 2019-08-08 DIAGNOSIS — Z79899 Other long term (current) drug therapy: Secondary | ICD-10-CM | POA: Diagnosis not present

## 2019-08-08 LAB — CMP (CANCER CENTER ONLY)
ALT: 37 U/L (ref 0–44)
AST: 34 U/L (ref 15–41)
Albumin: 3.8 g/dL (ref 3.5–5.0)
Alkaline Phosphatase: 65 U/L (ref 38–126)
Anion gap: 8 (ref 5–15)
BUN: 17 mg/dL (ref 8–23)
CO2: 29 mmol/L (ref 22–32)
Calcium: 9.2 mg/dL (ref 8.9–10.3)
Chloride: 102 mmol/L (ref 98–111)
Creatinine: 0.95 mg/dL (ref 0.61–1.24)
GFR, Est AFR Am: >60 mL/min (ref >60)
GFR, Estimated: >60 mL/min (ref >60)
Glucose, Bld: 96 mg/dL (ref 70–99)
Potassium: 3.9 mmol/L (ref 3.5–5.1)
Sodium: 139 mmol/L (ref 135–145)
Total Bilirubin: 0.6 mg/dL (ref 0.3–1.2)
Total Protein: 6.3 g/dL — ABNORMAL LOW (ref 6.5–8.1)

## 2019-08-08 LAB — CBC WITH DIFFERENTIAL (CANCER CENTER ONLY)
Abs Immature Granulocytes: 0.02 10*3/uL (ref 0.00–0.07)
Basophils Absolute: 0 10*3/uL (ref 0.0–0.1)
Basophils Relative: 1 %
Eosinophils Absolute: 0.3 10*3/uL (ref 0.0–0.5)
Eosinophils Relative: 5 %
HCT: 40.9 % (ref 39.0–52.0)
Hemoglobin: 14.1 g/dL (ref 13.0–17.0)
Immature Granulocytes: 0 %
Lymphocytes Relative: 45 %
Lymphs Abs: 2.5 10*3/uL (ref 0.7–4.0)
MCH: 35.1 pg — ABNORMAL HIGH (ref 26.0–34.0)
MCHC: 34.5 g/dL (ref 30.0–36.0)
MCV: 101.7 fL — ABNORMAL HIGH (ref 80.0–100.0)
Monocytes Absolute: 0.6 10*3/uL (ref 0.1–1.0)
Monocytes Relative: 11 %
Neutro Abs: 2.1 10*3/uL (ref 1.7–7.7)
Neutrophils Relative %: 38 %
Platelet Count: 185 10*3/uL (ref 150–400)
RBC: 4.02 MIL/uL — ABNORMAL LOW (ref 4.22–5.81)
RDW: 12 % (ref 11.5–15.5)
WBC Count: 5.5 10*3/uL (ref 4.0–10.5)
nRBC: 0 % (ref 0.0–0.2)

## 2019-08-08 NOTE — Progress Notes (Signed)
[name] [phone]   Fax:(336) [phone]  OFFICE PROGRESS NOTE  [name], MD 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  PRIOR THERAPY: 1) Status post right upper lobectomy under the care of Dr. Roxan Hockey on 01/23/2006.  2) Tarceva 150 mg by mouth daily as part of the BMS checkmate 370 clinical trial. Status post 6 weeks of treatment.  CURRENT THERAPY: Tarceva 100 mg by mouth daily as part of the BMS Checkmate 370 clinical trial. First dose started 07/17/2014 status post 61 months of treatment.  INTERVAL HISTORY: [name] 74 y.o. male returns to the clinic today for follow-up visit.  The patient is feeling fine today with no concerning complaints.  He denied having any skin rash or diarrhea.  He denied having any chest pain, shortness of breath, cough or hemoptysis.  He denied having any fever or chills.  He has no headache or visual changes.  He had a recent cataract surgery.  He is here today for evaluation and repeat blood work.  He continues to tolerate his treatment with Tarceva fairly well.  MEDICAL HISTORY: Past Medical History:  Diagnosis Date  . Cholelithiases 07/16/2015  . Complication of anesthesia 2007   quit breathing with bronchoscopy, had to spend night  . History of agent Orange exposure 44-45 yrs ago  . History of pneumonia  last 3-4 yrs ago   3 -4 different times  . Hypercholesterolemia   . Hypertension   . Hypothyroidism   . lung ca dx'd 2007   lung right  . Sinus disease    hx of  . Sleep apnea    uses cpap setting opf 12    ALLERGIES:  is allergic to lisinopril and prednisone.  MEDICATIONS:  Current Outpatient Medications  Medication Sig Dispense Refill  . amitriptyline (ELAVIL) 50 MG tablet Take 50 mg by mouth at  bedtime. Reported on 06/18/2015    . aspirin 81 MG tablet Take 81 mg by mouth every morning.     . Cholecalciferol 2000 units CAPS Take 2,000 Units by mouth daily.     . clindamycin (CLEOCIN T) 1 % external solution APPLY TO AFFECTED AREA(S) TWO TIMES A DAY AS NEEDED FOR SKIN IRRITATION 30 mL 0  . diltiazem (TIAZAC) 180 MG 24 hr capsule TAKE ONE CAPSULE BY MOUTH DAILY 90 capsule 3  . erlotinib (TARCEVA) 100 MG tablet Take 1 tablet (100 mg total) by mouth daily. Take on an empty stomach 1 hour before meals or 2 hours after.faxed to [facility] 90 tablet 3  . levETIRAcetam (KEPPRA) 750 MG tablet Take 750 mg by mouth at bedtime.     . metoprolol succinate (TOPROL-XL) 50 MG 24 hr tablet Take 1 tablet (50 mg total) by mouth daily. Take with or immediately following a meal. 90 tablet 3  . moxifloxacin (VIGAMOX) 0.5 % ophthalmic solution     . Multiple Vitamins-Minerals (CENTRUM SILVER PO) Take 1 tablet by mouth daily.     . nitroGLYCERIN (NITROSTAT) 0.4 MG SL tablet PLACE ONE TABLET UNDER THE TONGUE EVERY 5 MINUTES AS NEEDED FOR CHEST PAIN 25 tablet 10  . polyethylene glycol (MIRALAX / GLYCOLAX) packet Take 17 g by mouth daily. Reported on 08/27/2015    . prednisoLONE acetate (PRED FORTE) 1 % ophthalmic suspension     .  psyllium (METAMUCIL) 58.6 % powder Take 1 packet by mouth daily. Reported on 08/27/2015    . rosuvastatin (CRESTOR) 5 MG tablet Take 5 mg by mouth every morning.     [name] SYNTHROID 112 MCG tablet Take 112 mcg by mouth daily.    . valsartan-hydrochlorothiazide (DIOVAN-HCT) 320-25 MG per tablet Take 1 tablet by mouth every morning.      No current facility-administered medications for this visit.    SURGICAL HISTORY:  Past Surgical History:  Procedure Laterality Date  . BIOPSY  05/21/2018   Procedure: BIOPSY;  Surgeon: Wonda Horner, MD;  Location: [facility];  Service: Endoscopy;;  . COLONOSCOPY WITH PROPOFOL N/A 03/19/2013   Procedure: COLONOSCOPY WITH PROPOFOL;  Surgeon: Garlan Fair, MD;  Location: [facility];  Service: Endoscopy;  Laterality: N/A;  . Cystourethroscopy, [facility] TURP.  04/16/2010  . ESOPHAGOGASTRODUODENOSCOPY (EGD) WITH PROPOFOL N/A 05/21/2018   Procedure: ESOPHAGOGASTRODUODENOSCOPY (EGD) WITH PROPOFOL;  Surgeon: Wonda Horner, MD;  Location: [facility];  Service: Endoscopy;  Laterality: N/A;  . NASAL SINUS SURGERY  1987  . Right video-assisted thoracoscopy, right upper lobectomy and  01/23/2006  . The Olympus video bronchoscope was introduced via the right  12/21/2005  . TONSILLECTOMY  1957  . Torn medial and lateral menisci, left knee.  11/06/2006    REVIEW OF SYSTEMS:  A comprehensive review of systems was negative.   PHYSICAL EXAMINATION: General appearance: alert, cooperative and no distress Head: Normocephalic, without obvious abnormality, atraumatic Neck: no adenopathy, no JVD, supple, symmetrical, trachea midline and thyroid not enlarged, symmetric, no tenderness/mass/nodules Lymph nodes: Cervical, supraclavicular, and axillary nodes normal. Resp: clear to auscultation bilaterally Back: symmetric, no curvature. ROM normal. No CVA tenderness. Cardio: regular rate and rhythm, S1, S2 normal, no murmur, click, rub or gallop GI: soft, non-tender; bowel sounds normal; no masses,  no organomegaly Extremities: extremities normal, atraumatic, no cyanosis or edema  ECOG PERFORMANCE STATUS: 1 - Symptomatic but completely ambulatory  Blood pressure 140/87, pulse 67, temperature 97.8 F (36.6 C), temperature source Temporal, resp. rate 18, height '6\' 1"'  (1.854 m), weight 229 lb (103.9 kg), SpO2 99 %.  LABORATORY DATA: Lab Results  Component Value Date   WBC 5.5 08/08/2019   HGB 14.1 08/08/2019   HCT 40.9 08/08/2019   MCV 101.7 (H) 08/08/2019   PLT 185 08/08/2019      Chemistry      Component Value Date/Time   NA 140 05/30/2019 0902   NA 141 03/11/2019 0959   NA 139 04/06/2017 0932   K 4.0 05/30/2019 0902   K 3.8 04/06/2017 0932    CL 104 05/30/2019 0902   CL 102 04/24/2012 0853   CO2 26 05/30/2019 0902   CO2 29 04/06/2017 0932   BUN 15 05/30/2019 0902   BUN 18 03/11/2019 0959   BUN 14.4 04/06/2017 0932   CREATININE 0.94 05/30/2019 0902   CREATININE 1.0 04/06/2017 0932      Component Value Date/Time   CALCIUM 9.5 05/30/2019 0902   CALCIUM 9.5 04/06/2017 0932   ALKPHOS 65 05/30/2019 0902   ALKPHOS 58 04/06/2017 0932   AST 36 05/30/2019 0902   AST 27 04/06/2017 0932   ALT 37 05/30/2019 0902   ALT 27 04/06/2017 0932   BILITOT 0.6 05/30/2019 0902   BILITOT 0.45 04/06/2017 0932       RADIOGRAPHIC STUDIES: CT VIRTUAL COLONOSCOPY DIAGNOSTIC  Result Date: 08/01/2019 CLINICAL DATA:  History of polyps EXAM: CT VIRTUAL COLONOSCOPY DIAGNOSTIC TECHNIQUE: The patient was given  a standard bowel preparation with Gastrografin and barium for fluid and stool tagging respectively. The quality of the bowel preparation is poor. Automated CO2 insufflation of the colon was performed prior to image acquisition and colonic distention is good. Image post processing was used to generate a 3D endoluminal fly-through projection of the colon and to electronically subtract stool/fluid as appropriate. COMPARISON:  05/31/2019 FINDINGS: VIRTUAL COLONOSCOPY There is a large amount of retained barium throughout the colon. Scattered sigmoid diverticula. No fixed polypoid filling defects or annular constricting lesions. Virtual colonoscopy is not designed to detect diminutive polyps (i.e., less than or equal to 5 mm), the presence or absence of which may not affect clinical management. CT ABDOMEN AND PELVIS WITHOUT CONTRAST Lower chest: Elevation of the right hemidiaphragm, stable. No acute abnormality. Hepatobiliary: 2.7 cm gallstone within the gallbladder. No focal hepatic abnormality. Pancreas: No focal abnormality or ductal dilatation. Spleen: No focal abnormality.  Normal size. Adrenals/Urinary Tract: No adrenal abnormality. Small exophytic cyst off  the midpole of the right kidney. No suspicious renal abnormality. No stones or hydronephrosis. Urinary bladder is unremarkable. Urinary bladder decompressed. Stomach/Bowel: Stomach and small bowel decompressed, unremarkable. Vascular/Lymphatic: Aortic atherosclerosis. No enlarged abdominal or pelvic lymph nodes. Reproductive: No visible focal abnormality. Other: No free fluid or free air. Small bilateral inguinal hernias containing fat. Musculoskeletal: No acute bony abnormality or focal bone lesion. IMPRESSION: Moderate to large amount of retained barium throughout the colon. Despite this, study is diagnostic. No visible fixed polypoid filling defects or annular constricting lesions. Scattered sigmoid diverticula. Cholelithiasis. Aortic atherosclerosis. No acute extra cardiac abnormality. Electronically Signed   By: Rolm Baptise M.D.   On: 08/01/2019 11:29    ASSESSMENT AND PLAN:  This is a very pleasant 74 years old white male with recurrent non-small cell lung cancer, adenocarcinoma and positive EGFR mutation in exon 21. He is currently on Tarceva 100 mg by mouth daily status post around 61 months of treatment. The patient continues to tolerate his treatment well with no concerning adverse effects. I recommended for him to continue his current treatment with Tarceva with the same dose. I will see him back for follow-up visit in 2 months for evaluation with repeat CT scan of the chest, abdomen pelvis for restaging of his disease. He was advised to call immediately if he has any concerning symptoms in the interval. The patient voices understanding of current disease status and treatment options and is in agreement with the current care plan. All questions were answered. The patient knows to call the clinic with any problems, questions or concerns. We can certainly see the patient much sooner if necessary.  Disclaimer: This note was dictated with voice recognition software. Similar sounding words can  inadvertently be transcribed and may not be corrected upon review.

## 2019-08-08 NOTE — Telephone Encounter (Signed)
Scheduled appt per 5/6 los - gave patient AVS and calender per los.

## 2019-08-12 DIAGNOSIS — I208 Other forms of angina pectoris: Secondary | ICD-10-CM | POA: Diagnosis not present

## 2019-08-12 DIAGNOSIS — E039 Hypothyroidism, unspecified: Secondary | ICD-10-CM | POA: Diagnosis not present

## 2019-08-12 DIAGNOSIS — I10 Essential (primary) hypertension: Secondary | ICD-10-CM | POA: Diagnosis not present

## 2019-08-12 DIAGNOSIS — N400 Enlarged prostate without lower urinary tract symptoms: Secondary | ICD-10-CM | POA: Diagnosis not present

## 2019-08-12 DIAGNOSIS — D649 Anemia, unspecified: Secondary | ICD-10-CM | POA: Diagnosis not present

## 2019-08-12 DIAGNOSIS — E785 Hyperlipidemia, unspecified: Secondary | ICD-10-CM | POA: Diagnosis not present

## 2019-08-12 DIAGNOSIS — I25110 Atherosclerotic heart disease of native coronary artery with unstable angina pectoris: Secondary | ICD-10-CM | POA: Diagnosis not present

## 2019-08-12 DIAGNOSIS — C3490 Malignant neoplasm of unspecified part of unspecified bronchus or lung: Secondary | ICD-10-CM | POA: Diagnosis not present

## 2019-08-12 DIAGNOSIS — E782 Mixed hyperlipidemia: Secondary | ICD-10-CM | POA: Diagnosis not present

## 2019-08-12 DIAGNOSIS — G43009 Migraine without aura, not intractable, without status migrainosus: Secondary | ICD-10-CM | POA: Diagnosis not present

## 2019-08-13 DIAGNOSIS — G43111 Migraine with aura, intractable, with status migrainosus: Secondary | ICD-10-CM | POA: Diagnosis not present

## 2019-08-13 DIAGNOSIS — G43019 Migraine without aura, intractable, without status migrainosus: Secondary | ICD-10-CM | POA: Diagnosis not present

## 2019-08-23 DIAGNOSIS — H35352 Cystoid macular degeneration, left eye: Secondary | ICD-10-CM | POA: Diagnosis not present

## 2019-10-04 ENCOUNTER — Other Ambulatory Visit: Payer: Self-pay

## 2019-10-04 ENCOUNTER — Inpatient Hospital Stay: Payer: Medicare Other | Attending: Internal Medicine

## 2019-10-04 DIAGNOSIS — R21 Rash and other nonspecific skin eruption: Secondary | ICD-10-CM | POA: Diagnosis not present

## 2019-10-04 DIAGNOSIS — Z79899 Other long term (current) drug therapy: Secondary | ICD-10-CM | POA: Diagnosis not present

## 2019-10-04 DIAGNOSIS — C3431 Malignant neoplasm of lower lobe, right bronchus or lung: Secondary | ICD-10-CM | POA: Insufficient documentation

## 2019-10-04 DIAGNOSIS — C3490 Malignant neoplasm of unspecified part of unspecified bronchus or lung: Secondary | ICD-10-CM

## 2019-10-04 LAB — CMP (CANCER CENTER ONLY)
ALT: 35 U/L (ref 0–44)
AST: 31 U/L (ref 15–41)
Albumin: 3.9 g/dL (ref 3.5–5.0)
Alkaline Phosphatase: 62 U/L (ref 38–126)
Anion gap: 10 (ref 5–15)
BUN: 17 mg/dL (ref 8–23)
CO2: 27 mmol/L (ref 22–32)
Calcium: 9.4 mg/dL (ref 8.9–10.3)
Chloride: 105 mmol/L (ref 98–111)
Creatinine: 0.94 mg/dL (ref 0.61–1.24)
GFR, Est AFR Am: >60 mL/min (ref >60)
GFR, Estimated: >60 mL/min (ref >60)
Glucose, Bld: 108 mg/dL — ABNORMAL HIGH (ref 70–99)
Potassium: 3.9 mmol/L (ref 3.5–5.1)
Sodium: 142 mmol/L (ref 135–145)
Total Bilirubin: 0.5 mg/dL (ref 0.3–1.2)
Total Protein: 6.5 g/dL (ref 6.5–8.1)

## 2019-10-04 LAB — CBC WITH DIFFERENTIAL (CANCER CENTER ONLY)
Abs Immature Granulocytes: 0.01 10*3/uL (ref 0.00–0.07)
Basophils Absolute: 0 10*3/uL (ref 0.0–0.1)
Basophils Relative: 1 %
Eosinophils Absolute: 0.3 10*3/uL (ref 0.0–0.5)
Eosinophils Relative: 5 %
HCT: 39.9 % (ref 39.0–52.0)
Hemoglobin: 13.8 g/dL (ref 13.0–17.0)
Immature Granulocytes: 0 %
Lymphocytes Relative: 40 %
Lymphs Abs: 2.4 10*3/uL (ref 0.7–4.0)
MCH: 34.6 pg — ABNORMAL HIGH (ref 26.0–34.0)
MCHC: 34.6 g/dL (ref 30.0–36.0)
MCV: 100 fL (ref 80.0–100.0)
Monocytes Absolute: 0.6 10*3/uL (ref 0.1–1.0)
Monocytes Relative: 10 %
Neutro Abs: 2.6 10*3/uL (ref 1.7–7.7)
Neutrophils Relative %: 44 %
Platelet Count: 181 10*3/uL (ref 150–400)
RBC: 3.99 MIL/uL — ABNORMAL LOW (ref 4.22–5.81)
RDW: 12.1 % (ref 11.5–15.5)
WBC Count: 5.8 10*3/uL (ref 4.0–10.5)
nRBC: 0 % (ref 0.0–0.2)

## 2019-10-08 ENCOUNTER — Ambulatory Visit (HOSPITAL_COMMUNITY)
Admission: RE | Admit: 2019-10-08 | Discharge: 2019-10-08 | Disposition: A | Discharge status: Home / Self Care | Payer: Medicare Other | Source: Ambulatory Visit | Attending: Internal Medicine | Admitting: Internal Medicine

## 2019-10-08 ENCOUNTER — Other Ambulatory Visit: Payer: Self-pay

## 2019-10-08 ENCOUNTER — Encounter (HOSPITAL_COMMUNITY): Payer: Self-pay

## 2019-10-08 DIAGNOSIS — C3490 Malignant neoplasm of unspecified part of unspecified bronchus or lung: Secondary | ICD-10-CM | POA: Diagnosis present

## 2019-10-08 MED ORDER — IOHEXOL 300 MG/ML  SOLN
100.0000 mL | Freq: Once | INTRAMUSCULAR | Status: AC | PRN
  Administered 2019-10-08: 100 mL via INTRAVENOUS

## 2019-10-08 MED ORDER — SODIUM CHLORIDE (PF) 0.9 % IJ SOLN
INTRAMUSCULAR | Status: AC
  Filled 2019-10-08: qty 50

## 2019-10-10 ENCOUNTER — Inpatient Hospital Stay (HOSPITAL_BASED_OUTPATIENT_CLINIC_OR_DEPARTMENT_OTHER): Payer: Medicare Other | Admitting: Internal Medicine

## 2019-10-10 ENCOUNTER — Other Ambulatory Visit: Payer: Self-pay

## 2019-10-10 ENCOUNTER — Telehealth: Payer: Self-pay | Admitting: Internal Medicine

## 2019-10-10 ENCOUNTER — Encounter: Payer: Self-pay | Admitting: Internal Medicine

## 2019-10-10 VITALS — BP 139/68 | HR 65 | Temp 97.9°F | Resp 18 | Ht 73.0 in | Wt 232.8 lb

## 2019-10-10 DIAGNOSIS — C3431 Malignant neoplasm of lower lobe, right bronchus or lung: Secondary | ICD-10-CM

## 2019-10-10 DIAGNOSIS — I10 Essential (primary) hypertension: Secondary | ICD-10-CM | POA: Diagnosis not present

## 2019-10-10 DIAGNOSIS — R21 Rash and other nonspecific skin eruption: Secondary | ICD-10-CM | POA: Diagnosis not present

## 2019-10-10 DIAGNOSIS — Z5111 Encounter for antineoplastic chemotherapy: Secondary | ICD-10-CM | POA: Diagnosis not present

## 2019-10-10 DIAGNOSIS — L270 Generalized skin eruption due to drugs and medicaments taken internally: Secondary | ICD-10-CM

## 2019-10-10 DIAGNOSIS — I209 Angina pectoris, unspecified: Secondary | ICD-10-CM

## 2019-10-10 DIAGNOSIS — Z79899 Other long term (current) drug therapy: Secondary | ICD-10-CM | POA: Diagnosis not present

## 2019-10-10 MED ORDER — CLINDAMYCIN PHOSPHATE 1 % EX SOLN: CUTANEOUS | 0 refills | Status: DC

## 2019-10-10 NOTE — Progress Notes (Signed)
[place] [phone]   [phone]  OFFICE PROGRESS NOTE  [name], MD 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  PRIOR THERAPY: 1) Status post right upper lobectomy under the care of Dr. Roxan Hockey on 01/23/2006.  2) Tarceva 150 mg by mouth daily as part of the BMS checkmate 370 clinical trial. Status post 6 weeks of treatment.  CURRENT THERAPY: Tarceva 100 mg by mouth daily as part of the BMS Checkmate 370 clinical trial. First dose started 07/17/2014 status post 63 months of treatment.  INTERVAL HISTORY: [name] 74 y.o. male returns to the clinic today for follow-up visit.  The patient is feeling fine today with no concerning complaints except for mild skin rash.  He denied having any diarrhea.  He has no recent weight loss or night sweats.  He has no chest pain, shortness of breath, cough or hemoptysis.  He has no nausea, vomiting or constipation.  He denied having any headache or visual changes.  The patient has been tolerating his treatment with Tarceva fairly well.  He had repeat CT scan of the chest, abdomen pelvis performed recently and he is here for evaluation and discussion of his discuss results.   MEDICAL HISTORY: Past Medical History:  Diagnosis Date   Cholelithiases 09/03/930   Complication of anesthesia 2007   quit breathing with bronchoscopy, had to spend night   History of agent Orange exposure 44-45 yrs ago   History of pneumonia  last 3-4 yrs ago   3 -4 different times   Hypercholesterolemia    Hypertension    Hypothyroidism    lung ca dx'd 2007   lung right   Sinus disease    hx of   Sleep apnea    uses cpap setting opf 12    ALLERGIES:  is allergic to lisinopril and prednisone.  MEDICATIONS:  Current  Outpatient Medications  Medication Sig Dispense Refill   amitriptyline (ELAVIL) 50 MG tablet Take 50 mg by mouth at bedtime. Reported on 06/18/2015     aspirin 81 MG tablet Take 81 mg by mouth every morning.      Cholecalciferol 2000 units CAPS Take 2,000 Units by mouth daily.      clindamycin (CLEOCIN T) 1 % external solution APPLY TO AFFECTED AREA(S) TWO TIMES A DAY AS NEEDED FOR SKIN IRRITATION 30 mL 0   diltiazem (TIAZAC) 180 MG 24 hr capsule TAKE ONE CAPSULE BY MOUTH DAILY 90 capsule 3   erlotinib (TARCEVA) 100 MG tablet Take 1 tablet (100 mg total) by mouth daily. Take on an empty stomach 1 hour before meals or 2 hours after.faxed to [facility] 90 tablet 3   levETIRAcetam (KEPPRA) 750 MG tablet Take 750 mg by mouth at bedtime.      metoprolol succinate (TOPROL-XL) 50 MG 24 hr tablet Take 1 tablet (50 mg total) by mouth daily. Take with or immediately following a meal. 90 tablet 3   Multiple Vitamins-Minerals (CENTRUM SILVER PO) Take 1 tablet by mouth daily.      nitroGLYCERIN (NITROSTAT) 0.4 MG SL tablet PLACE ONE TABLET UNDER THE TONGUE EVERY 5 MINUTES AS NEEDED FOR CHEST PAIN 25 tablet 10   polyethylene glycol (MIRALAX / GLYCOLAX) packet Take 17 g by mouth daily. Reported on 08/27/2015     psyllium (  METAMUCIL) 58.6 % powder Take 1 packet by mouth daily. Reported on 08/27/2015     rosuvastatin (CRESTOR) 5 MG tablet Take 5 mg by mouth every morning.      SYNTHROID 112 MCG tablet Take 112 mcg by mouth daily.     valsartan-hydrochlorothiazide (DIOVAN-HCT) 320-25 MG per tablet Take 1 tablet by mouth every morning.      No current facility-administered medications for this visit.    SURGICAL HISTORY:  Past Surgical History:  Procedure Laterality Date   BIOPSY  05/21/2018   Procedure: BIOPSY;  Surgeon: Wonda Horner, MD;  Location: [facility];  Service: Endoscopy;;   COLONOSCOPY WITH PROPOFOL N/A 03/19/2013   Procedure: COLONOSCOPY WITH PROPOFOL;  Surgeon: Garlan Fair,  MD;  Location: [facility];  Service: Endoscopy;  Laterality: N/A;   Cystourethroscopy, [facility].  04/16/2010   ESOPHAGOGASTRODUODENOSCOPY (EGD) WITH PROPOFOL N/A 05/21/2018   Procedure: ESOPHAGOGASTRODUODENOSCOPY (EGD) WITH PROPOFOL;  Surgeon: Wonda Horner, MD;  Location: [facility];  Service: Endoscopy;  Laterality: N/A;   NASAL SINUS SURGERY  1987   Right video-assisted thoracoscopy, right upper lobectomy and  01/23/2006   The Olympus video bronchoscope was introduced via the right  12/21/2005   TONSILLECTOMY  1957   Torn medial and lateral menisci, left knee.  11/06/2006    REVIEW OF SYSTEMS:  Constitutional: negative Eyes: negative Ears, nose, mouth, throat, and face: negative Respiratory: negative Cardiovascular: negative Gastrointestinal: negative Genitourinary:negative Integument/breast: positive for rash Hematologic/lymphatic: negative Musculoskeletal:negative Neurological: negative Behavioral/Psych: negative Endocrine: negative Allergic/Immunologic: negative   PHYSICAL EXAMINATION: General appearance: alert, cooperative and no distress Head: Normocephalic, without obvious abnormality, atraumatic Neck: no adenopathy, no JVD, supple, symmetrical, trachea midline and thyroid not enlarged, symmetric, no tenderness/mass/nodules Lymph nodes: Cervical, supraclavicular, and axillary nodes normal. Resp: clear to auscultation bilaterally Back: symmetric, no curvature. ROM normal. No CVA tenderness. Cardio: regular rate and rhythm, S1, S2 normal, no murmur, click, rub or gallop GI: soft, non-tender; bowel sounds normal; no masses,  no organomegaly Extremities: extremities normal, atraumatic, no cyanosis or edema Neurologic: Alert and oriented X 3, normal strength and tone. Normal symmetric reflexes. Normal coordination and gait  ECOG PERFORMANCE STATUS: 1 - Symptomatic but completely ambulatory  Blood pressure 139/68, pulse 65, temperature 97.9 F (36.6 C),  temperature source Temporal, resp. rate 18, height '6\' 1"'  (1.854 m), weight 232 lb 12.8 oz (105.6 kg), SpO2 97 %.  LABORATORY DATA: Lab Results  Component Value Date   WBC 5.8 10/04/2019   HGB 13.8 10/04/2019   HCT 39.9 10/04/2019   MCV 100.0 10/04/2019   PLT 181 10/04/2019      Chemistry      Component Value Date/Time   NA 142 10/04/2019 0941   NA 141 03/11/2019 0959   NA 139 04/06/2017 0932   K 3.9 10/04/2019 0941   K 3.8 04/06/2017 0932   CL 105 10/04/2019 0941   CL 102 04/24/2012 0853   CO2 27 10/04/2019 0941   CO2 29 04/06/2017 0932   BUN 17 10/04/2019 0941   BUN 18 03/11/2019 0959   BUN 14.4 04/06/2017 0932   CREATININE 0.94 10/04/2019 0941   CREATININE 1.0 04/06/2017 0932      Component Value Date/Time   CALCIUM 9.4 10/04/2019 0941   CALCIUM 9.5 04/06/2017 0932   ALKPHOS 62 10/04/2019 0941   ALKPHOS 58 04/06/2017 0932   AST 31 10/04/2019 0941   AST 27 04/06/2017 0932   ALT 35 10/04/2019 0941   ALT 27 04/06/2017 0932  BILITOT 0.5 10/04/2019 0941   BILITOT 0.45 04/06/2017 0932       RADIOGRAPHIC STUDIES: CT Chest W Contrast  Result Date: 10/08/2019 CLINICAL DATA:  Primary Cancer Type: Lung Imaging Indication: Routine surveillance Interval therapy since last imaging? Yes Initial Cancer Diagnosis Date: 12/21/2005 Established by: Biopsy-proven Detailed Pathology: Stage IA non-small cell lung cancer, adenocarcinoma. Primary Tumor location: Right upper lobe 2007. Right lower lobe 2016. Recurrence? Yes; Date(s) of recurrence: 05/14/2014; Established by: Biopsy-proven Surgeries: Right upper lobectomy 01/23/2006. Chemotherapy: Yes; Ongoing? Yes; Most recent administration: Tarceva daily, beginning 2016. Immunotherapy? No Radiation therapy? No EXAM: CT CHEST, ABDOMEN, AND PELVIS WITH CONTRAST TECHNIQUE: Multidetector CT imaging of the chest, abdomen and pelvis was performed following the standard protocol during bolus administration of intravenous contrast. CONTRAST:  174m  OMNIPAQUE IOHEXOL 300 MG/ML  SOLN COMPARISON:  Most recent CT chest, abdomen and pelvis 05/31/2019. 05/07/2014 PET-CT. FINDINGS: CT CHEST FINDINGS Cardiovascular: The heart size is normal. No substantial pericardial effusion. Atherosclerotic calcification is noted in the wall of the thoracic aorta. Mediastinum/Nodes: No mediastinal lymphadenopathy. There is no hilar lymphadenopathy. The esophagus has normal imaging features. There is no axillary lymphadenopathy. Lungs/Pleura: Stable volume loss right hemithorax Centrilobular emphsyema noted. Index right lower lobe anterior pulmonary nodule is 2.4 x 1.2 cm today compared to 2.2 x 1.2 cm previously (remeasured). Tiny left upper lobe pulmonary nodule documented previously is stable on 47/6 today. Another 2 mm nodule previously documented in the anterior right lower lobe is compatible with calcified granuloma (67/6). Medial right lung nodule on 41/6 is 1.8 x 1.0 cm today which is stable compared to 1.8 x 1.0 cm previously (remeasured). No new suspicious nodule or mass. No focal airspace consolidation. No pleural effusion. Musculoskeletal: No worrisome lytic or sclerotic osseous abnormality. CT ABDOMEN PELVIS FINDINGS Hepatobiliary: No suspicious focal abnormality within the liver parenchyma. 28 mm calcified gallstone. No intrahepatic or extrahepatic biliary dilation. Pancreas: No focal mass lesion. No dilatation of the main duct. No intraparenchymal cyst. No peripancreatic edema. Spleen: No splenomegaly. No focal mass lesion. Adrenals/Urinary Tract: No adrenal nodule or mass. Kidneys unremarkable. No evidence for hydroureter. The urinary bladder appears normal for the degree of distention. Stomach/Bowel: Stomach is unremarkable. No gastric wall thickening. No evidence of outlet obstruction. Duodenum is normally positioned as is the ligament of Treitz. No small bowel wall thickening. No small bowel dilatation. The terminal ileum is normal. The appendix is normal. No  gross colonic mass. No colonic wall thickening. Vascular/Lymphatic: There is abdominal aortic atherosclerosis without aneurysm. There is no gastrohepatic or hepatoduodenal ligament lymphadenopathy. No retroperitoneal or mesenteric lymphadenopathy. No pelvic sidewall lymphadenopathy. Reproductive: Prostate is enlarged. Other: No intraperitoneal free fluid. Musculoskeletal: No worrisome lytic or sclerotic osseous abnormality. IMPRESSION: 1. Stable exam. Post treatment changes right hemithorax. No new or progressive findings in the chest, abdomen, or pelvis. 2. Stable bilateral pulmonary nodules. 3. Cholelithiasis. 4. Prostatomegaly. 5. Aortic Atherosclerosis (ICD10-I70.0) and Emphysema (ICD10-J43.9). Electronically Signed   By: EMisty StanleyM.D.   On: 10/08/2019 10:16   CT Abdomen Pelvis W Contrast  Result Date: 10/08/2019 CLINICAL DATA:  Primary Cancer Type: Lung Imaging Indication: Routine surveillance Interval therapy since last imaging? Yes Initial Cancer Diagnosis Date: 12/21/2005 Established by: Biopsy-proven Detailed Pathology: Stage IA non-small cell lung cancer, adenocarcinoma. Primary Tumor location: Right upper lobe 2007. Right lower lobe 2016. Recurrence? Yes; Date(s) of recurrence: 05/14/2014; Established by: Biopsy-proven Surgeries: Right upper lobectomy 01/23/2006. Chemotherapy: Yes; Ongoing? Yes; Most recent administration: Tarceva daily, beginning 2016. Immunotherapy? No Radiation therapy?  No EXAM: CT CHEST, ABDOMEN, AND PELVIS WITH CONTRAST TECHNIQUE: Multidetector CT imaging of the chest, abdomen and pelvis was performed following the standard protocol during bolus administration of intravenous contrast. CONTRAST:  156m OMNIPAQUE IOHEXOL 300 MG/ML  SOLN COMPARISON:  Most recent CT chest, abdomen and pelvis 05/31/2019. 05/07/2014 PET-CT. FINDINGS: CT CHEST FINDINGS Cardiovascular: The heart size is normal. No substantial pericardial effusion. Atherosclerotic calcification is noted in the wall of  the thoracic aorta. Mediastinum/Nodes: No mediastinal lymphadenopathy. There is no hilar lymphadenopathy. The esophagus has normal imaging features. There is no axillary lymphadenopathy. Lungs/Pleura: Stable volume loss right hemithorax Centrilobular emphsyema noted. Index right lower lobe anterior pulmonary nodule is 2.4 x 1.2 cm today compared to 2.2 x 1.2 cm previously (remeasured). Tiny left upper lobe pulmonary nodule documented previously is stable on 47/6 today. Another 2 mm nodule previously documented in the anterior right lower lobe is compatible with calcified granuloma (67/6). Medial right lung nodule on 41/6 is 1.8 x 1.0 cm today which is stable compared to 1.8 x 1.0 cm previously (remeasured). No new suspicious nodule or mass. No focal airspace consolidation. No pleural effusion. Musculoskeletal: No worrisome lytic or sclerotic osseous abnormality. CT ABDOMEN PELVIS FINDINGS Hepatobiliary: No suspicious focal abnormality within the liver parenchyma. 28 mm calcified gallstone. No intrahepatic or extrahepatic biliary dilation. Pancreas: No focal mass lesion. No dilatation of the main duct. No intraparenchymal cyst. No peripancreatic edema. Spleen: No splenomegaly. No focal mass lesion. Adrenals/Urinary Tract: No adrenal nodule or mass. Kidneys unremarkable. No evidence for hydroureter. The urinary bladder appears normal for the degree of distention. Stomach/Bowel: Stomach is unremarkable. No gastric wall thickening. No evidence of outlet obstruction. Duodenum is normally positioned as is the ligament of Treitz. No small bowel wall thickening. No small bowel dilatation. The terminal ileum is normal. The appendix is normal. No gross colonic mass. No colonic wall thickening. Vascular/Lymphatic: There is abdominal aortic atherosclerosis without aneurysm. There is no gastrohepatic or hepatoduodenal ligament lymphadenopathy. No retroperitoneal or mesenteric lymphadenopathy. No pelvic sidewall lymphadenopathy.  Reproductive: Prostate is enlarged. Other: No intraperitoneal free fluid. Musculoskeletal: No worrisome lytic or sclerotic osseous abnormality. IMPRESSION: 1. Stable exam. Post treatment changes right hemithorax. No new or progressive findings in the chest, abdomen, or pelvis. 2. Stable bilateral pulmonary nodules. 3. Cholelithiasis. 4. Prostatomegaly. 5. Aortic Atherosclerosis (ICD10-I70.0) and Emphysema (ICD10-J43.9). Electronically Signed   By: EMisty StanleyM.D.   On: 10/08/2019 10:16    ASSESSMENT AND PLAN:  This is a very pleasant 74years old white male with recurrent non-small cell lung cancer, adenocarcinoma and positive EGFR mutation in exon 21. He is currently on Tarceva 100 mg by mouth daily status post around 63 months of treatment. He continues to tolerate his treatment well with no concerning adverse effects. He had repeat CT scan of the chest, abdomen pelvis performed recently.  I personally and independently reviewed the scan and discussed the results with the patient today. His scan showed no concerning findings for disease recurrence or progression. I recommended for him to continue his current treatment with Tarceva with the same dose. He will come back for follow-up visit in 2 months for evaluation with repeat blood work. For the skin rash, I gave the patient a refill of clindamycin lotion. He was advised to call immediately if he has any concerning symptoms in the interval. The patient voices understanding of current disease status and treatment options and is in agreement with the current care plan. All questions were answered. The patient knows  to call the clinic with any problems, questions or concerns. We can certainly see the patient much sooner if necessary.  Disclaimer: This note was dictated with voice recognition software. Similar sounding words can inadvertently be transcribed and may not be corrected upon review.

## 2019-10-10 NOTE — Telephone Encounter (Signed)
Scheduled per 07/08 los, patient has been received updated calender.

## 2019-10-11 DIAGNOSIS — D225 Melanocytic nevi of trunk: Secondary | ICD-10-CM | POA: Diagnosis not present

## 2019-10-11 DIAGNOSIS — L308 Other specified dermatitis: Secondary | ICD-10-CM | POA: Diagnosis not present

## 2019-10-11 DIAGNOSIS — D485 Neoplasm of uncertain behavior of skin: Secondary | ICD-10-CM | POA: Diagnosis not present

## 2019-10-11 DIAGNOSIS — Z1283 Encounter for screening for malignant neoplasm of skin: Secondary | ICD-10-CM | POA: Diagnosis not present

## 2019-10-21 ENCOUNTER — Other Ambulatory Visit: Payer: Self-pay | Admitting: Thoracic Surgery (Cardiothoracic Vascular Surgery)

## 2019-10-21 DIAGNOSIS — C3431 Malignant neoplasm of lower lobe, right bronchus or lung: Secondary | ICD-10-CM

## 2019-10-22 ENCOUNTER — Encounter: Payer: Self-pay | Admitting: Thoracic Surgery (Cardiothoracic Vascular Surgery)

## 2019-10-22 ENCOUNTER — Ambulatory Visit
Admission: RE | Admit: 2019-10-22 | Discharge: 2019-10-22 | Disposition: A | Discharge status: Home / Self Care | Payer: Medicare Other | Source: Ambulatory Visit | Attending: Thoracic Surgery (Cardiothoracic Vascular Surgery) | Admitting: Thoracic Surgery (Cardiothoracic Vascular Surgery)

## 2019-10-22 ENCOUNTER — Other Ambulatory Visit: Payer: Self-pay

## 2019-10-22 ENCOUNTER — Ambulatory Visit (INDEPENDENT_AMBULATORY_CARE_PROVIDER_SITE_OTHER): Payer: Medicare Other | Admitting: Thoracic Surgery (Cardiothoracic Vascular Surgery)

## 2019-10-22 VITALS — BP 156/92 | HR 55 | Temp 97.6°F | Resp 16 | Ht 73.0 in | Wt 232.0 lb

## 2019-10-22 DIAGNOSIS — R918 Other nonspecific abnormal finding of lung field: Secondary | ICD-10-CM | POA: Diagnosis not present

## 2019-10-22 DIAGNOSIS — J984 Other disorders of lung: Secondary | ICD-10-CM | POA: Diagnosis not present

## 2019-10-22 DIAGNOSIS — Z902 Acquired absence of lung [part of]: Secondary | ICD-10-CM

## 2019-10-22 DIAGNOSIS — C3431 Malignant neoplasm of lower lobe, right bronchus or lung: Secondary | ICD-10-CM | POA: Diagnosis not present

## 2019-10-22 DIAGNOSIS — C3411 Malignant neoplasm of upper lobe, right bronchus or lung: Secondary | ICD-10-CM | POA: Diagnosis not present

## 2019-10-22 DIAGNOSIS — C3490 Malignant neoplasm of unspecified part of unspecified bronchus or lung: Secondary | ICD-10-CM | POA: Diagnosis not present

## 2019-10-22 DIAGNOSIS — I209 Angina pectoris, unspecified: Secondary | ICD-10-CM | POA: Diagnosis not present

## 2019-10-22 DIAGNOSIS — R911 Solitary pulmonary nodule: Secondary | ICD-10-CM | POA: Diagnosis not present

## 2019-10-22 NOTE — Progress Notes (Signed)
[address]       [place] [address]             [phone]      HPI: [name] returns for scheduled follow-up visit  [name] is a 74 year old man with history of hypertension, hyperlipidemia, sleep apnea.he is a lifelong non-smoker.  He had a stage Ia adenocarcinoma in 2007 treated with a right upper lobectomy.  5 years ago he developed recurrence.  Biopsies were positive for adenocarcinoma and molecular testing was positive for EGFR mutation.  He has been on Tarceva since then.  He continues to see Dr. [name] every 4 months.  He saw him a couple of weeks ago and had a CT scan.  He has been feeling well.  He has not had any new medical issues.  He did notice that his blood pressure was elevated when he saw Dr. [name] and again today.  He says he checks it at home and it has been normal.  Past Medical History:  Diagnosis Date  . Cholelithiases 07/16/2015  . Complication of anesthesia 2007   quit breathing with bronchoscopy, had to spend night  . History of agent Orange exposure 44-45 yrs ago  . History of pneumonia  last 3-4 yrs ago   3 -4 different times  . Hypercholesterolemia   . Hypertension   . Hypothyroidism   . lung ca dx'd 2007   lung right  . Sinus disease    hx of  . Sleep apnea    uses cpap setting opf 12    Current Outpatient Medications  Medication Sig Dispense Refill  . amitriptyline (ELAVIL) 50 MG tablet Take 50 mg by mouth at bedtime. Reported on 06/18/2015    . aspirin 81 MG tablet Take 81 mg by mouth every morning.     . Cholecalciferol 2000 units CAPS Take 2,000 Units by mouth daily.     . clindamycin (CLEOCIN T) 1 % external solution APPLY TO AFFECTED AREA(S) TWO TIMES A DAY AS NEEDED FOR SKIN IRRITATION 30 mL 0  . diltiazem (TIAZAC) 180 MG 24 hr capsule TAKE ONE CAPSULE BY MOUTH DAILY 90 capsule 3  . erlotinib (TARCEVA) 100 MG tablet Take 1 tablet (100 mg total) by mouth daily. Take on an empty stomach 1 hour before meals  or 2 hours after.faxed to [facility] 90 tablet 3  . levETIRAcetam (KEPPRA) 750 MG tablet Take 750 mg by mouth at bedtime.     . metoprolol succinate (TOPROL-XL) 50 MG 24 hr tablet Take 1 tablet (50 mg total) by mouth daily. Take with or immediately following a meal. 90 tablet 3  . Multiple Vitamins-Minerals (CENTRUM SILVER PO) Take 1 tablet by mouth daily.     . nitroGLYCERIN (NITROSTAT) 0.4 MG SL tablet PLACE ONE TABLET UNDER THE TONGUE EVERY 5 MINUTES AS NEEDED FOR CHEST PAIN 25 tablet 10  . polyethylene glycol (MIRALAX / GLYCOLAX) packet Take 17 g by mouth daily. Reported on 08/27/2015    . psyllium (METAMUCIL) 58.6 % powder Take 1 packet by mouth daily. Reported on 08/27/2015    . rosuvastatin (CRESTOR) 5 MG tablet Take 5 mg by mouth every morning.     [name] SYNTHROID 112 MCG tablet Take 112 mcg by mouth daily.    . valsartan-hydrochlorothiazide (DIOVAN-HCT) 320-25 MG per tablet Take 1 tablet by mouth every morning.      No current facility-administered medications for this visit.    Physical Exam BP (!) 156/92 (  BP Location: Left Arm, Patient Position: Sitting, Cuff Size: Normal) Comment: RUNS HIGH IN MD OFFICES  Pulse (!) 55   Temp 97.6 F (36.4 C)   Resp 16   Ht _0  (1.854 m)   Wt 232 lb (105.2 kg)   SpO2 98% Comment: RA  BMI 30.33 kg/m  74 year old man in no acute distress Well developed and well nourished Alert and oriented x3 with no focal deficits Lungs diminished at right base but otherwise clear Cardiac regular rate and rhythm  Diagnostic Tests: CHEST - 2 VIEW  COMPARISON:  Chest CT 10/08/2019 and chest radiographs 10/23/2018  FINDINGS: The cardiomediastinal silhouette is unchanged with normal heart size. There is chronic volume loss in the right hemithorax with scarring in the right mid and upper lung. A 2 cm nodular density in the right mid lung is similar to the recent CT. The left lung remains clear. No pleural effusion or pneumothorax is identified. No acute  osseous abnormality is seen.  IMPRESSION: Chronic right lung changes without evidence of an acute cardiopulmonary process.   Electronically Signed   By: Logan Bores M.D.   On: 10/22/2019 10:01 CT CHEST, ABDOMEN, AND PELVIS WITH CONTRAST  TECHNIQUE: Multidetector CT imaging of the chest, abdomen and pelvis was performed following the standard protocol during bolus administration of intravenous contrast.  CONTRAST:  138m OMNIPAQUE IOHEXOL 300 MG/ML  SOLN  COMPARISON:  Most recent CT chest, abdomen and pelvis 05/31/2019. 05/07/2014 PET-CT.  FINDINGS: CT CHEST FINDINGS  Cardiovascular: The heart size is normal. No substantial pericardial effusion. Atherosclerotic calcification is noted in the wall of the thoracic aorta.  Mediastinum/Nodes: No mediastinal lymphadenopathy. There is no hilar lymphadenopathy. The esophagus has normal imaging features. There is no axillary lymphadenopathy.  Lungs/Pleura: Stable volume loss right hemithorax Centrilobular emphsyema noted.  Index right lower lobe anterior pulmonary nodule is 2.4 x 1.2 cm today compared to 2.2 x 1.2 cm previously (remeasured).  Tiny left upper lobe pulmonary nodule documented previously is stable on 47/6 today.  Another 2 mm nodule previously documented in the anterior right lower lobe is compatible with calcified granuloma (67/6).  Medial right lung nodule on 41/6 is 1.8 x 1.0 cm today which is stable compared to 1.8 x 1.0 cm previously (remeasured).  No new suspicious nodule or mass. No focal airspace consolidation. No pleural effusion.  Musculoskeletal: No worrisome lytic or sclerotic osseous abnormality.  CT ABDOMEN PELVIS FINDINGS  Hepatobiliary: No suspicious focal abnormality within the liver parenchyma. 28 mm calcified gallstone. No intrahepatic or extrahepatic biliary dilation.  Pancreas: No focal mass lesion. No dilatation of the main duct. No intraparenchymal cyst. No  peripancreatic edema.  Spleen: No splenomegaly. No focal mass lesion.  Adrenals/Urinary Tract: No adrenal nodule or mass. Kidneys unremarkable. No evidence for hydroureter. The urinary bladder appears normal for the degree of distention.  Stomach/Bowel: Stomach is unremarkable. No gastric wall thickening. No evidence of outlet obstruction. Duodenum is normally positioned as is the ligament of Treitz. No small bowel wall thickening. No small bowel dilatation. The terminal ileum is normal. The appendix is normal. No gross colonic mass. No colonic wall thickening.  Vascular/Lymphatic: There is abdominal aortic atherosclerosis without aneurysm. There is no gastrohepatic or hepatoduodenal ligament lymphadenopathy. No retroperitoneal or mesenteric lymphadenopathy. No pelvic sidewall lymphadenopathy.  Reproductive: Prostate is enlarged.  Other: No intraperitoneal free fluid.  Musculoskeletal: No worrisome lytic or sclerotic osseous abnormality.  IMPRESSION: 1. Stable exam. Post treatment changes right hemithorax. No new or progressive findings in the  chest, abdomen, or pelvis. 2. Stable bilateral pulmonary nodules. 3. Cholelithiasis. 4. Prostatomegaly. 5. Aortic Atherosclerosis (ICD10-I70.0) and Emphysema (ICD10-J43.9).   Electronically Signed   By: Misty Stanley M.D.   On: 10/08/2019 10:16 I personally reviewed the chest x-ray and CT images and concur with the findings noted above  Impression: [name] is a 74 year old non-smoker who had a stage Ia adenocarcinoma the right upper lobe resected in 2007.  He then developed a recurrence in 2016.  It also was adenocarcinoma.  EGFR was positive.  He has been on Tarceva since then.  He is doing well.  His recent CT showed no signs of disease progression.  He tolerates the Tarceva well.  He will continue to follow-up with Dr. [name] every 4 months.  I will plan to see him back in a year  Plan: Return in 1 year after  CT chest  Melrose Nakayama, MD [facility] [phone]

## 2019-11-21 DIAGNOSIS — I25110 Atherosclerotic heart disease of native coronary artery with unstable angina pectoris: Secondary | ICD-10-CM | POA: Diagnosis not present

## 2019-11-21 DIAGNOSIS — D649 Anemia, unspecified: Secondary | ICD-10-CM | POA: Diagnosis not present

## 2019-11-21 DIAGNOSIS — C3490 Malignant neoplasm of unspecified part of unspecified bronchus or lung: Secondary | ICD-10-CM | POA: Diagnosis not present

## 2019-11-21 DIAGNOSIS — E782 Mixed hyperlipidemia: Secondary | ICD-10-CM | POA: Diagnosis not present

## 2019-11-21 DIAGNOSIS — K219 Gastro-esophageal reflux disease without esophagitis: Secondary | ICD-10-CM | POA: Diagnosis not present

## 2019-11-21 DIAGNOSIS — G43009 Migraine without aura, not intractable, without status migrainosus: Secondary | ICD-10-CM | POA: Diagnosis not present

## 2019-11-21 DIAGNOSIS — I208 Other forms of angina pectoris: Secondary | ICD-10-CM | POA: Diagnosis not present

## 2019-11-21 DIAGNOSIS — E785 Hyperlipidemia, unspecified: Secondary | ICD-10-CM | POA: Diagnosis not present

## 2019-11-21 DIAGNOSIS — I10 Essential (primary) hypertension: Secondary | ICD-10-CM | POA: Diagnosis not present

## 2019-11-21 DIAGNOSIS — N400 Enlarged prostate without lower urinary tract symptoms: Secondary | ICD-10-CM | POA: Diagnosis not present

## 2019-11-21 DIAGNOSIS — E039 Hypothyroidism, unspecified: Secondary | ICD-10-CM | POA: Diagnosis not present

## 2019-11-28 ENCOUNTER — Other Ambulatory Visit: Payer: Self-pay

## 2019-11-28 DIAGNOSIS — I8393 Asymptomatic varicose veins of bilateral lower extremities: Secondary | ICD-10-CM

## 2019-11-28 DIAGNOSIS — D649 Anemia, unspecified: Secondary | ICD-10-CM | POA: Diagnosis not present

## 2019-11-28 DIAGNOSIS — I208 Other forms of angina pectoris: Secondary | ICD-10-CM | POA: Diagnosis not present

## 2019-11-28 DIAGNOSIS — I10 Essential (primary) hypertension: Secondary | ICD-10-CM | POA: Diagnosis not present

## 2019-11-28 DIAGNOSIS — E782 Mixed hyperlipidemia: Secondary | ICD-10-CM | POA: Diagnosis not present

## 2019-11-28 DIAGNOSIS — I25110 Atherosclerotic heart disease of native coronary artery with unstable angina pectoris: Secondary | ICD-10-CM | POA: Diagnosis not present

## 2019-11-28 DIAGNOSIS — Z0001 Encounter for general adult medical examination with abnormal findings: Secondary | ICD-10-CM | POA: Diagnosis not present

## 2019-11-28 DIAGNOSIS — E039 Hypothyroidism, unspecified: Secondary | ICD-10-CM | POA: Diagnosis not present

## 2019-11-28 DIAGNOSIS — E559 Vitamin D deficiency, unspecified: Secondary | ICD-10-CM | POA: Diagnosis not present

## 2019-11-28 DIAGNOSIS — K5900 Constipation, unspecified: Secondary | ICD-10-CM | POA: Diagnosis not present

## 2019-11-28 DIAGNOSIS — Z79899 Other long term (current) drug therapy: Secondary | ICD-10-CM | POA: Diagnosis not present

## 2019-11-28 DIAGNOSIS — C3490 Malignant neoplasm of unspecified part of unspecified bronchus or lung: Secondary | ICD-10-CM | POA: Diagnosis not present

## 2019-11-28 DIAGNOSIS — G43009 Migraine without aura, not intractable, without status migrainosus: Secondary | ICD-10-CM | POA: Diagnosis not present

## 2019-12-10 ENCOUNTER — Ambulatory Visit (HOSPITAL_COMMUNITY)
Admission: RE | Admit: 2019-12-10 | Discharge: 2019-12-10 | Disposition: A | Discharge status: Home / Self Care | Payer: Medicare Other | Source: Ambulatory Visit | Attending: Thoracic Surgery (Cardiothoracic Vascular Surgery) | Admitting: Thoracic Surgery (Cardiothoracic Vascular Surgery)

## 2019-12-10 ENCOUNTER — Other Ambulatory Visit: Payer: Self-pay

## 2019-12-10 DIAGNOSIS — I8393 Asymptomatic varicose veins of bilateral lower extremities: Secondary | ICD-10-CM | POA: Diagnosis not present

## 2019-12-12 ENCOUNTER — Inpatient Hospital Stay: Payer: Medicare Other | Attending: Internal Medicine

## 2019-12-12 ENCOUNTER — Inpatient Hospital Stay (HOSPITAL_BASED_OUTPATIENT_CLINIC_OR_DEPARTMENT_OTHER): Payer: Medicare Other | Admitting: Internal Medicine

## 2019-12-12 ENCOUNTER — Encounter: Payer: Self-pay | Admitting: Internal Medicine

## 2019-12-12 ENCOUNTER — Other Ambulatory Visit: Payer: Self-pay

## 2019-12-12 ENCOUNTER — Ambulatory Visit (INDEPENDENT_AMBULATORY_CARE_PROVIDER_SITE_OTHER): Payer: Medicare Other | Admitting: Vascular Surgery

## 2019-12-12 ENCOUNTER — Telehealth: Payer: Self-pay | Admitting: Internal Medicine

## 2019-12-12 ENCOUNTER — Encounter: Payer: Self-pay | Admitting: Vascular Surgery

## 2019-12-12 VITALS — BP 154/66 | HR 93 | Temp 97.0°F | Resp 20 | Ht 73.0 in | Wt 234.6 lb

## 2019-12-12 VITALS — BP 156/92 | HR 67 | Temp 97.3°F | Resp 18 | Ht 72.0 in | Wt 234.0 lb

## 2019-12-12 DIAGNOSIS — C3431 Malignant neoplasm of lower lobe, right bronchus or lung: Secondary | ICD-10-CM | POA: Insufficient documentation

## 2019-12-12 DIAGNOSIS — I209 Angina pectoris, unspecified: Secondary | ICD-10-CM

## 2019-12-12 DIAGNOSIS — Z5111 Encounter for antineoplastic chemotherapy: Secondary | ICD-10-CM | POA: Diagnosis not present

## 2019-12-12 DIAGNOSIS — C3490 Malignant neoplasm of unspecified part of unspecified bronchus or lung: Secondary | ICD-10-CM

## 2019-12-12 DIAGNOSIS — I83813 Varicose veins of bilateral lower extremities with pain: Secondary | ICD-10-CM | POA: Diagnosis not present

## 2019-12-12 DIAGNOSIS — I10 Essential (primary) hypertension: Secondary | ICD-10-CM

## 2019-12-12 DIAGNOSIS — Z8701 Personal history of pneumonia (recurrent): Secondary | ICD-10-CM | POA: Diagnosis not present

## 2019-12-12 DIAGNOSIS — Z79899 Other long term (current) drug therapy: Secondary | ICD-10-CM | POA: Insufficient documentation

## 2019-12-12 DIAGNOSIS — I8393 Asymptomatic varicose veins of bilateral lower extremities: Secondary | ICD-10-CM

## 2019-12-12 LAB — CBC WITH DIFFERENTIAL (CANCER CENTER ONLY)
Abs Immature Granulocytes: 0.02 10*3/uL (ref 0.00–0.07)
Basophils Absolute: 0 10*3/uL (ref 0.0–0.1)
Basophils Relative: 1 %
Eosinophils Absolute: 0.3 10*3/uL (ref 0.0–0.5)
Eosinophils Relative: 5 %
HCT: 41 % (ref 39.0–52.0)
Hemoglobin: 14.2 g/dL (ref 13.0–17.0)
Immature Granulocytes: 0 %
Lymphocytes Relative: 45 %
Lymphs Abs: 2.4 10*3/uL (ref 0.7–4.0)
MCH: 35 pg — ABNORMAL HIGH (ref 26.0–34.0)
MCHC: 34.6 g/dL (ref 30.0–36.0)
MCV: 101 fL — ABNORMAL HIGH (ref 80.0–100.0)
Monocytes Absolute: 0.6 10*3/uL (ref 0.1–1.0)
Monocytes Relative: 11 %
Neutro Abs: 2 10*3/uL (ref 1.7–7.7)
Neutrophils Relative %: 38 %
Platelet Count: 182 10*3/uL (ref 150–400)
RBC: 4.06 MIL/uL — ABNORMAL LOW (ref 4.22–5.81)
RDW: 12 % (ref 11.5–15.5)
WBC Count: 5.3 10*3/uL (ref 4.0–10.5)
nRBC: 0 % (ref 0.0–0.2)

## 2019-12-12 LAB — CMP (CANCER CENTER ONLY)
ALT: 35 U/L (ref 0–44)
AST: 36 U/L (ref 15–41)
Albumin: 4.1 g/dL (ref 3.5–5.0)
Alkaline Phosphatase: 60 U/L (ref 38–126)
Anion gap: 7 (ref 5–15)
BUN: 16 mg/dL (ref 8–23)
CO2: 29 mmol/L (ref 22–32)
Calcium: 9.8 mg/dL (ref 8.9–10.3)
Chloride: 104 mmol/L (ref 98–111)
Creatinine: 0.98 mg/dL (ref 0.61–1.24)
GFR, Est AFR Am: >60 mL/min (ref >60)
GFR, Estimated: >60 mL/min (ref >60)
Glucose, Bld: 101 mg/dL — ABNORMAL HIGH (ref 70–99)
Potassium: 4 mmol/L (ref 3.5–5.1)
Sodium: 140 mmol/L (ref 135–145)
Total Bilirubin: 0.8 mg/dL (ref 0.3–1.2)
Total Protein: 6.9 g/dL (ref 6.5–8.1)

## 2019-12-12 NOTE — Progress Notes (Signed)
REASON FOR CONSULT:    Painful varicose veins bilaterally.  The consult is requested by Dr. Modesto Charon.  ASSESSMENT & PLAN:   VARICOSE VEINS BILATERALLY: This patient has a large varicose veins bilaterally however they are not especially symptomatic.  He has no significant deep venous reflux but does have significant superficial venous reflux.  We have discussed the importance of intermittent leg elevation the proper positioning for this.  In addition we have fitted him for knee-high compression stockings with a gradient of 15 to 20 mmHg.  I have encouraged him to avoid prolonged sitting and standing.  We discussed the importance of exercise specifically walking and water aerobics.  I encouraged him to keep his skin well lubricated especially in the left foot and ankle where he would be at risk for bleeding from his small spider veins.  As he has minimal symptoms at this point I would not recommend laser ablation of the saphenous veins and stab phlebectomies.  If he develops significant symptoms then we could try him in thigh-high compression stockings with a gradient of 20 to 30 mmHg.  If this were not successful and he was having recurrent problems with phlebitis or other venous issues then certainly he could be considered for staged laser ablation of the great saphenous veins with stab phlebectomies.  He will call if he develops new symptoms or new concerns.  Deitra Mayo, MD Office: [phone]   HPI:   [name] is a pleasant 74 y.o. 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  He also has a family history of varicose veins.  He tells me his veins have really not been very painful.  He denies significant aching pain or heaviness in his legs.  He has had no real problems with swelling.  His only real complaint is some mild aching pain in his posterior left thigh which sounds like he has had some phlebitis.  He has had no previous history  of venous procedures.  He has no history of DVT.  He does not wear compression stockings.  He does elevate his legs some.  He does not require any ibuprofen or anti-inflammatory medication for pain.  He denies any history of claudication or rest pain.  Past Medical History:  Diagnosis Date  . Cholelithiases 07/16/2015  . Complication of anesthesia 2007   quit breathing with bronchoscopy, had to spend night  . History of agent Orange exposure 44-45 yrs ago  . History of pneumonia  last 3-4 yrs ago   3 -4 different times  . Hypercholesterolemia   . Hypertension   . Hypothyroidism   . lung ca dx'd 2007   lung right  . Sinus disease    hx of  . Sleep apnea    uses cpap setting opf 12    Family History  Problem Relation Age of Onset  . Hypertension Mother   . Varicose Veins Mother   . Heart attack Father   . Heart attack Brother   . Varicose Veins Sister     SOCIAL HISTORY: Social History   Socioeconomic History  . Marital status: Married    Spouse name: Not on file  . Number of children: 0  . Years of education: Not on file  . Highest education level: Not on file  Occupational History  . Occupation: Administrator    Comment: Retired  Tobacco Use  . Smoking status: Never Smoker  . Smokeless tobacco: Never Used  Vaping Use  . Vaping Use: Never used  Substance and Sexual Activity  . Alcohol use: No    Alcohol/week: 0.0 standard drinks  . Drug use: No  . Sexual activity: Not Currently  Other Topics Concern  . Not on file  Social History Narrative  . Not on file   Social Determinants of Health   Financial Resource Strain:   . Difficulty of Paying Living Expenses: Not on file  Food Insecurity:   . Worried About Charity fundraiser in the Last Year: Not on file  . Ran Out of Food in the Last Year: Not on file  Transportation Needs:   . Lack of Transportation (Medical): Not on file  . Lack of Transportation (Non-Medical): Not on file  Physical Activity:   .  Days of Exercise per Week: Not on file  . Minutes of Exercise per Session: Not on file  Stress:   . Feeling of Stress : Not on file  Social Connections:   . Frequency of Communication with Friends and Family: Not on file  . Frequency of Social Gatherings with Friends and Family: Not on file  . Attends Religious Services: Not on file  . Active Member of Clubs or Organizations: Not on file  . Attends Archivist Meetings: Not on file  . Marital Status: Not on file  Intimate Partner Violence:   . Fear of Current or Ex-Partner: Not on file  . Emotionally Abused: Not on file  . Physically Abused: Not on file  . Sexually Abused: Not on file    Allergies  Allergen Reactions  . Lisinopril Cough  . Prednisone Rash    Current Outpatient Medications  Medication Sig Dispense Refill  . amitriptyline (ELAVIL) 50 MG tablet Take 50 mg by mouth at bedtime. Reported on 06/18/2015    . aspirin 81 MG tablet Take 81 mg by mouth every morning.     . Cholecalciferol 2000 units CAPS Take 2,000 Units by mouth daily.     . clindamycin (CLEOCIN T) 1 % external solution APPLY TO AFFECTED AREA(S) TWO TIMES A DAY AS NEEDED FOR SKIN IRRITATION 30 mL 0  . diltiazem (TIAZAC) 180 MG 24 hr capsule TAKE ONE CAPSULE BY MOUTH DAILY 90 capsule 3  . erlotinib (TARCEVA) 100 MG tablet Take 1 tablet (100 mg total) by mouth daily. Take on an empty stomach 1 hour before meals or 2 hours after.faxed to [facility] 90 tablet 3  . levETIRAcetam (KEPPRA) 750 MG tablet Take 750 mg by mouth at bedtime.     . metoprolol succinate (TOPROL-XL) 50 MG 24 hr tablet Take 1 tablet (50 mg total) by mouth daily. Take with or immediately following a meal. 90 tablet 3  . Multiple Vitamins-Minerals (CENTRUM SILVER PO) Take 1 tablet by mouth daily.     . nitroGLYCERIN (NITROSTAT) 0.4 MG SL tablet PLACE ONE TABLET UNDER THE TONGUE EVERY 5 MINUTES AS NEEDED FOR CHEST PAIN 25 tablet 10  . polyethylene glycol (MIRALAX / GLYCOLAX) packet Take  17 g by mouth daily. Reported on 08/27/2015    . psyllium (METAMUCIL) 58.6 % powder Take 1 packet by mouth daily. Reported on 08/27/2015    . rosuvastatin (CRESTOR) 5 MG tablet Take 5 mg by mouth every morning.     [name] SYNTHROID 112 MCG tablet Take 112 mcg by mouth daily.    . valsartan-hydrochlorothiazide (DIOVAN-HCT) 320-25 MG per tablet Take 1 tablet by mouth every morning.      No current facility-administered  medications for this visit.    REVIEW OF SYSTEMS:  [X]  denotes positive finding, [ ]  denotes negative finding Cardiac  Comments:  Chest pain or chest pressure:    Shortness of breath upon exertion:    Short of breath when lying flat:    Irregular heart rhythm:        Vascular    Pain in calf, thigh, or hip brought on by ambulation:    Pain in feet at night that wakes you up from your sleep:     Blood clot in your veins:    Leg swelling:         Pulmonary    Oxygen at home:    Productive cough:     Wheezing:         Neurologic    Sudden weakness in arms or legs:     Sudden numbness in arms or legs:     Sudden onset of difficulty speaking or slurred speech:    Temporary loss of vision in one eye:     Problems with dizziness:  x       Gastrointestinal    Blood in stool:     Vomited blood:         Genitourinary    Burning when urinating:     Blood in urine:        Psychiatric    Major depression:         Hematologic    Bleeding problems:    Problems with blood clotting too easily:        Skin    Rashes or ulcers:        Constitutional    Fever or chills:     PHYSICAL EXAM:   Vitals:   12/12/19 1345  BP: (!) 156/92  Pulse: 67  Resp: 18  Temp: (!) 97.3 F (36.3 C)  TempSrc: Temporal  SpO2: 97%  Weight: 234 lb (106.1 kg)  Height: 6' (1.829 m)    GENERAL: The patient is a well-nourished male, in no acute distress. The vital signs are documented above. CARDIAC: There is a regular rate and rhythm.  VASCULAR: I do not detect carotid bruits. He has  palpable posterior tibial pulses bilaterally. He has a large varicose veins of both lower extremities as documented below. 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 I did look at both great saphenous veins myself with the SonoSite.   On the right side the great saphenous vein exits the fascia in the proximal thigh.  He would be a candidate for laser ablation of the proximal segment only. On the left side there is reflux in the left great saphenous vein all the way down to the knee which remains within the fascia.  The vein is significantly dilated.  The vein here could be cannulated in the distal thigh.            PULMONARY: There is good air exchange bilaterally without wheezing or rales. ABDOMEN: Soft and non-tender with normal pitched bowel sounds.  MUSCULOSKELETAL: There are no major deformities or cyanosis. NEUROLOGIC: No focal weakness or paresthesias are detected. SKIN: There are no ulcers or rashes noted. PSYCHIATRIC: The patient has a normal affect.  DATA:    VENOUS DUPLEX: I reviewed the venous duplex scan that was done 2 days ago in our office.  On the right side there was no evidence of DVT.  There was no deep  venous reflux.  There was superficial venous reflux in the right great saphenous vein from the saphenofemoral junction to the knee.  Diameters of the vein ranged from 0.55-0.72 cm.  On the left side there was no evidence of DVT.  There was no deep venous reflux.  There was superficial venous reflux in the left great saphenous vein from the saphenofemoral junction to the knee.  Diameters of the vein ranged from 0.55-0.65 cm.

## 2019-12-12 NOTE — Telephone Encounter (Signed)
Scheduled appointment per 9/9 los. Patient is aware of appointments. Gave patient updated calendar.

## 2019-12-12 NOTE — Progress Notes (Signed)
[facility] Telephone:([phone]   [phone]  OFFICE PROGRESS NOTE  Andrew Huddle, MD [address] 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  PRIOR THERAPY: 1) Status post right upper lobectomy under the care of Dr. Roxan Hockey on 01/23/2006.  2) Tarceva 150 mg by mouth daily as part of the BMS checkmate 370 clinical trial. Status post 6 weeks of treatment.  CURRENT THERAPY: Tarceva 100 mg by mouth daily as part of the BMS Checkmate 370 clinical trial. First dose started 07/17/2014 status post 65 months of treatment.  INTERVAL HISTORY: [name] 74 y.o. male returns to the clinic today for follow-up visit.  The patient is feeling fine today with no concerning complaints except for occasional pain in the right upper quadrant of the abdomen.  He denied having any current chest pain, shortness of breath, cough or hemoptysis.  He denied having any fever or chills.  He has no nausea, vomiting, diarrhea or constipation.  He has no headache or visual changes.  The patient has no recent weight loss or night sweats.  He continues to tolerate his treatment with Tarceva fairly well.  He is concerned about switching to generic erlotinib in January.  The patient is here today for evaluation with repeat blood work.   MEDICAL HISTORY: Past Medical History:  Diagnosis Date  . Cholelithiases 07/16/2015  . Complication of anesthesia 2007   quit breathing with bronchoscopy, had to spend night  . History of agent Orange exposure 44-45 yrs ago  . History of pneumonia  last 3-4 yrs ago   3 -4 different times  . Hypercholesterolemia   . Hypertension   . Hypothyroidism   . lung ca dx'd 2007   lung right  . Sinus disease    hx of  . Sleep apnea    uses cpap setting opf 12    ALLERGIES:  is  allergic to lisinopril and prednisone.  MEDICATIONS:  Current Outpatient Medications  Medication Sig Dispense Refill  . amitriptyline (ELAVIL) 50 MG tablet Take 50 mg by mouth at bedtime. Reported on 06/18/2015    . aspirin 81 MG tablet Take 81 mg by mouth every morning.     . Cholecalciferol 2000 units CAPS Take 2,000 Units by mouth daily.     . clindamycin (CLEOCIN T) 1 % external solution APPLY TO AFFECTED AREA(S) TWO TIMES A DAY AS NEEDED FOR SKIN IRRITATION 30 mL 0  . diltiazem (TIAZAC) 180 MG 24 hr capsule TAKE ONE CAPSULE BY MOUTH DAILY 90 capsule 3  . erlotinib (TARCEVA) 100 MG tablet Take 1 tablet (100 mg total) by mouth daily. Take on an empty stomach 1 hour before meals or 2 hours after.faxed to [facility] 90 tablet 3  . levETIRAcetam (KEPPRA) 750 MG tablet Take 750 mg by mouth at bedtime.     . metoprolol succinate (TOPROL-XL) 50 MG 24 hr tablet Take 1 tablet (50 mg total) by mouth daily. Take with or immediately following a meal. 90 tablet 3  . Multiple Vitamins-Minerals (CENTRUM SILVER PO) Take 1 tablet by mouth daily.     . nitroGLYCERIN (NITROSTAT) 0.4 MG SL tablet PLACE ONE TABLET UNDER THE TONGUE EVERY 5 MINUTES AS NEEDED FOR CHEST PAIN 25 tablet 10  . polyethylene glycol (MIRALAX / GLYCOLAX) packet Take 17 g by mouth  daily. Reported on 08/27/2015    . psyllium (METAMUCIL) 58.6 % powder Take 1 packet by mouth daily. Reported on 08/27/2015    . rosuvastatin (CRESTOR) 5 MG tablet Take 5 mg by mouth every morning.     [name] SYNTHROID 112 MCG tablet Take 112 mcg by mouth daily.    . valsartan-hydrochlorothiazide (DIOVAN-HCT) 320-25 MG per tablet Take 1 tablet by mouth every morning.      No current facility-administered medications for this visit.    SURGICAL HISTORY:  Past Surgical History:  Procedure Laterality Date  . BIOPSY  05/21/2018   Procedure: BIOPSY;  Surgeon: Wonda Horner, MD;  Location: [facility];  Service: Endoscopy;;  . COLONOSCOPY WITH PROPOFOL N/A 03/19/2013    Procedure: COLONOSCOPY WITH PROPOFOL;  Surgeon: Garlan Fair, MD;  Location: [facility];  Service: Endoscopy;  Laterality: N/A;  . Cystourethroscopy, [facility] T[facility].  04/16/2010  . ESOPHAGOGASTRODUODENOSCOPY (EGD) WITH PROPOFOL N/A 05/21/2018   Procedure: ESOPHAGOGASTRODUODENOSCOPY (EGD) WITH PROPOFOL;  Surgeon: Wonda Horner, MD;  Location: [facility];  Service: Endoscopy;  Laterality: N/A;  . NASAL SINUS SURGERY  1987  . Right video-assisted thoracoscopy, right upper lobectomy and  01/23/2006  . The Olympus video bronchoscope was introduced via the right  12/21/2005  . TONSILLECTOMY  1957  . Torn medial and lateral menisci, left knee.  11/06/2006    REVIEW OF SYSTEMS:  A comprehensive review of systems was negative.   PHYSICAL EXAMINATION: General appearance: alert, cooperative and no distress Head: Normocephalic, without obvious abnormality, atraumatic Neck: no adenopathy, no JVD, supple, symmetrical, trachea midline and thyroid not enlarged, symmetric, no tenderness/mass/nodules Lymph nodes: Cervical, supraclavicular, and axillary nodes normal. Resp: clear to auscultation bilaterally Back: symmetric, no curvature. ROM normal. No CVA tenderness. Cardio: regular rate and rhythm, S1, S2 normal, no murmur, click, rub or gallop GI: soft, non-tender; bowel sounds normal; no masses,  no organomegaly Extremities: extremities normal, atraumatic, no cyanosis or edema  ECOG PERFORMANCE STATUS: 1 - Symptomatic but completely ambulatory  Blood pressure (!) 154/66, pulse 93, temperature (!) 97 F (36.1 C), temperature source Tympanic, resp. rate 20, height '6\' 1"'  (1.854 m), weight 234 lb 9.6 oz (106.4 kg), SpO2 98 %.  LABORATORY DATA: Lab Results  Component Value Date   WBC 5.3 12/12/2019   HGB 14.2 12/12/2019   HCT 41.0 12/12/2019   MCV 101.0 (H) 12/12/2019   PLT 182 12/12/2019      Chemistry      Component Value Date/Time   NA 140 12/12/2019 0847   NA 141 03/11/2019 0959   NA  139 04/06/2017 0932   K 4.0 12/12/2019 0847   K 3.8 04/06/2017 0932   CL 104 12/12/2019 0847   CL 102 04/24/2012 0853   CO2 29 12/12/2019 0847   CO2 29 04/06/2017 0932   BUN 16 12/12/2019 0847   BUN 18 03/11/2019 0959   BUN 14.4 04/06/2017 0932   CREATININE 0.98 12/12/2019 0847   CREATININE 1.0 04/06/2017 0932      Component Value Date/Time   CALCIUM 9.8 12/12/2019 0847   CALCIUM 9.5 04/06/2017 0932   ALKPHOS 60 12/12/2019 0847   ALKPHOS 58 04/06/2017 0932   AST 36 12/12/2019 0847   AST 27 04/06/2017 0932   ALT 35 12/12/2019 0847   ALT 27 04/06/2017 0932   BILITOT 0.8 12/12/2019 0847   BILITOT 0.45 04/06/2017 0932       RADIOGRAPHIC STUDIES: VAS [place] LOWER EXTREMITY VENOUS REFLUX  Result Date: 12/10/2019  Lower  Venous Reflux Study Indications: Ulceration, Edema, Swelling, and Pain.  Performing Technologist: Delorise Shiner RVT  Examination Guidelines: A complete evaluation includes B-mode imaging, spectral Doppler, color Doppler, and power Doppler as needed of all accessible portions of each vessel. Bilateral testing is considered an integral part of a complete examination. Limited examinations for reoccurring indications may be performed as noted. The reflux portion of the exam is performed with the patient in reverse Trendelenburg. Significant venous reflux is defined as >500 ms in the superficial venous system, and >1 second in the deep venous system.  Venous Reflux Times +--------------+---------+------+-----------+------------+--------+ RIGHT         Reflux NoRefluxReflux TimeDiameter cmsComments                         Yes                                  +--------------+---------+------+-----------+------------+--------+ CFV           no                                             +--------------+---------+------+-----------+------------+--------+ FV prox       no                                              +--------------+---------+------+-----------+------------+--------+ FV mid        no                                             +--------------+---------+------+-----------+------------+--------+ FV dist       no                                             +--------------+---------+------+-----------+------------+--------+ Popliteal     no                                             +--------------+---------+------+-----------+------------+--------+ GSV at SFJ              yes    >500 ms     0.630             +--------------+---------+------+-----------+------------+--------+ GSV prox thigh          yes    >500 ms     0.548             +--------------+---------+------+-----------+------------+--------+ GSV mid thigh           yes    >500 ms     0.662             +--------------+---------+------+-----------+------------+--------+ GSV dist thigh          yes    >500 ms     0.721             +--------------+---------+------+-----------+------------+--------+ GSV at knee  yes    >500 ms     0.621             +--------------+---------+------+-----------+------------+--------+ GSV prox calf                              0.397             +--------------+---------+------+-----------+------------+--------+ GSV mid calf                               0.393             +--------------+---------+------+-----------+------------+--------+ SSV Pop Fossa                              0.256             +--------------+---------+------+-----------+------------+--------+ SSV prox calf no                           0.246             +--------------+---------+------+-----------+------------+--------+ SSV mid calf  no                           0.352             +--------------+---------+------+-----------+------------+--------+  +--------------+---------+------+-----------+------------+--------+ LEFT          Reflux NoRefluxReflux  TimeDiameter cmsComments                         Yes                                  +--------------+---------+------+-----------+------------+--------+ CFV           no                                             +--------------+---------+------+-----------+------------+--------+ FV prox       no                                             +--------------+---------+------+-----------+------------+--------+ FV mid        no                                             +--------------+---------+------+-----------+------------+--------+ FV dist       no                                             +--------------+---------+------+-----------+------------+--------+ Popliteal     no                                             +--------------+---------+------+-----------+------------+--------+ GSV  at [facility]              yes    >500 ms     0.642             +--------------+---------+------+-----------+------------+--------+ GSV prox thigh          yes    >500 ms     0.647             +--------------+---------+------+-----------+------------+--------+ GSV mid thigh           yes    >500 ms     0.610             +--------------+---------+------+-----------+------------+--------+ GSV dist thigh          yes    >500 ms     0.642             +--------------+---------+------+-----------+------------+--------+ GSV at knee             yes    >500 ms     0.552             +--------------+---------+------+-----------+------------+--------+ GSV prox calf                              0.451             +--------------+---------+------+-----------+------------+--------+ GSV mid calf                               0.435             +--------------+---------+------+-----------+------------+--------+ SSV Pop Fossa                              0.424             +--------------+---------+------+-----------+------------+--------+ SSV prox calf            yes    >500 ms     0.407             +--------------+---------+------+-----------+------------+--------+ SSV mid calf  no                           0.429             +--------------+---------+------+-----------+------------+--------+   Summary: Bilateral: - No evidence of deep vein thrombosis seen in the lower extremities, bilaterally, from the common femoral through the popliteal veins.  Right: - No evidence of superficial venous reflux seen in the right short saphenous vein. - Venous reflux is noted in the right sapheno-femoral junction. - Venous reflux is noted in the right greater saphenous vein in the thigh. - Venous reflux is noted in the right greater saphenous vein in the calf.  Left: - Venous reflux is noted in the left sapheno-femoral junction. - Venous reflux is noted in the left greater saphenous vein in the thigh. - Venous reflux is noted in the left greater saphenous vein in the calf. - Venous reflux is noted in the left short saphenous vein. - Palpable varicosities were interrogated with ultrasound. No evidence of superficial thrombophlebitis.  *See table(s) above for measurements and observations. Electronically signed by Monica Martinez MD on 12/10/2019 at 4:43:10 PM.    Final     ASSESSMENT AND PLAN:  This is a very pleasant 74 years old white male with recurrent non-small cell lung cancer, adenocarcinoma and  positive EGFR mutation in exon 21. He is currently on Tarceva 100 mg by mouth daily status post around 65 months of treatment. The patient continues to tolerate this treatment well with no concerning adverse effects. I recommended for him to continue his current treatment with Tarceva with the same dose. I will see him back for follow-up visit in 2 months for evaluation with repeat CT scan of the chest, abdomen pelvis for restaging of his disease. The patient was advised to call immediately if he has any concerning symptoms in the interval. The patient voices  understanding of current disease status and treatment options and is in agreement with the current care plan. All questions were answered. The patient knows to call the clinic with any problems, questions or concerns. We can certainly see the patient much sooner if necessary.  Disclaimer: This note was dictated with voice recognition software. Similar sounding words can inadvertently be transcribed and may not be corrected upon review.

## 2020-02-01 ENCOUNTER — Ambulatory Visit: Payer: Medicare Other | Attending: Internal Medicine

## 2020-02-01 DIAGNOSIS — E039 Hypothyroidism, unspecified: Secondary | ICD-10-CM | POA: Diagnosis not present

## 2020-02-01 DIAGNOSIS — I25110 Atherosclerotic heart disease of native coronary artery with unstable angina pectoris: Secondary | ICD-10-CM | POA: Diagnosis not present

## 2020-02-01 DIAGNOSIS — C3490 Malignant neoplasm of unspecified part of unspecified bronchus or lung: Secondary | ICD-10-CM | POA: Diagnosis not present

## 2020-02-01 DIAGNOSIS — G43009 Migraine without aura, not intractable, without status migrainosus: Secondary | ICD-10-CM | POA: Diagnosis not present

## 2020-02-01 DIAGNOSIS — I10 Essential (primary) hypertension: Secondary | ICD-10-CM | POA: Diagnosis not present

## 2020-02-01 DIAGNOSIS — D649 Anemia, unspecified: Secondary | ICD-10-CM | POA: Diagnosis not present

## 2020-02-01 DIAGNOSIS — I208 Other forms of angina pectoris: Secondary | ICD-10-CM | POA: Diagnosis not present

## 2020-02-01 DIAGNOSIS — J439 Emphysema, unspecified: Secondary | ICD-10-CM | POA: Diagnosis not present

## 2020-02-01 DIAGNOSIS — K219 Gastro-esophageal reflux disease without esophagitis: Secondary | ICD-10-CM | POA: Diagnosis not present

## 2020-02-01 DIAGNOSIS — Z23 Encounter for immunization: Secondary | ICD-10-CM

## 2020-02-01 DIAGNOSIS — E785 Hyperlipidemia, unspecified: Secondary | ICD-10-CM | POA: Diagnosis not present

## 2020-02-01 DIAGNOSIS — N400 Enlarged prostate without lower urinary tract symptoms: Secondary | ICD-10-CM | POA: Diagnosis not present

## 2020-02-01 DIAGNOSIS — E782 Mixed hyperlipidemia: Secondary | ICD-10-CM | POA: Diagnosis not present

## 2020-02-01 NOTE — Progress Notes (Signed)
° °  Covid-19 Vaccination Clinic  Name:  [name]    MRN: [id number] DOB: 07/08/1945  02/01/2020  [name] was observed post Covid-19 immunization for 15 minutes without incident. He was provided with Vaccine Information Sheet and instruction to access the V-Safe system.   [name] was instructed to call 911 with any severe reactions post vaccine:  Difficulty breathing   Swelling of face and throat   A fast heartbeat   A bad rash all over body   Dizziness and weakness

## 2020-02-10 ENCOUNTER — Other Ambulatory Visit: Payer: Self-pay

## 2020-02-10 ENCOUNTER — Inpatient Hospital Stay: Payer: Medicare Other | Attending: Internal Medicine

## 2020-02-10 DIAGNOSIS — G4730 Sleep apnea, unspecified: Secondary | ICD-10-CM | POA: Diagnosis not present

## 2020-02-10 DIAGNOSIS — K5730 Diverticulosis of large intestine without perforation or abscess without bleeding: Secondary | ICD-10-CM | POA: Insufficient documentation

## 2020-02-10 DIAGNOSIS — E039 Hypothyroidism, unspecified: Secondary | ICD-10-CM | POA: Insufficient documentation

## 2020-02-10 DIAGNOSIS — I10 Essential (primary) hypertension: Secondary | ICD-10-CM | POA: Diagnosis not present

## 2020-02-10 DIAGNOSIS — K8020 Calculus of gallbladder without cholecystitis without obstruction: Secondary | ICD-10-CM | POA: Insufficient documentation

## 2020-02-10 DIAGNOSIS — Z79899 Other long term (current) drug therapy: Secondary | ICD-10-CM | POA: Insufficient documentation

## 2020-02-10 DIAGNOSIS — Z888 Allergy status to other drugs, medicaments and biological substances status: Secondary | ICD-10-CM | POA: Diagnosis not present

## 2020-02-10 DIAGNOSIS — C3431 Malignant neoplasm of lower lobe, right bronchus or lung: Secondary | ICD-10-CM | POA: Insufficient documentation

## 2020-02-10 DIAGNOSIS — C3490 Malignant neoplasm of unspecified part of unspecified bronchus or lung: Secondary | ICD-10-CM

## 2020-02-10 DIAGNOSIS — Z9079 Acquired absence of other genital organ(s): Secondary | ICD-10-CM | POA: Insufficient documentation

## 2020-02-10 DIAGNOSIS — Z23 Encounter for immunization: Secondary | ICD-10-CM | POA: Insufficient documentation

## 2020-02-10 DIAGNOSIS — I700 Atherosclerosis of aorta: Secondary | ICD-10-CM | POA: Diagnosis not present

## 2020-02-10 LAB — CBC WITH DIFFERENTIAL (CANCER CENTER ONLY)
Abs Immature Granulocytes: 0.01 10*3/uL (ref 0.00–0.07)
Basophils Absolute: 0 10*3/uL (ref 0.0–0.1)
Basophils Relative: 1 %
Eosinophils Absolute: 0.3 10*3/uL (ref 0.0–0.5)
Eosinophils Relative: 5 %
HCT: 40.3 % (ref 39.0–52.0)
Hemoglobin: 13.7 g/dL (ref 13.0–17.0)
Immature Granulocytes: 0 %
Lymphocytes Relative: 46 %
Lymphs Abs: 2.6 10*3/uL (ref 0.7–4.0)
MCH: 33.9 pg (ref 26.0–34.0)
MCHC: 34 g/dL (ref 30.0–36.0)
MCV: 99.8 fL (ref 80.0–100.0)
Monocytes Absolute: 0.6 10*3/uL (ref 0.1–1.0)
Monocytes Relative: 10 %
Neutro Abs: 2.1 10*3/uL (ref 1.7–7.7)
Neutrophils Relative %: 38 %
Platelet Count: 216 10*3/uL (ref 150–400)
RBC: 4.04 MIL/uL — ABNORMAL LOW (ref 4.22–5.81)
RDW: 12 % (ref 11.5–15.5)
WBC Count: 5.5 10*3/uL (ref 4.0–10.5)
nRBC: 0 % (ref 0.0–0.2)

## 2020-02-10 LAB — CMP (CANCER CENTER ONLY)
ALT: 31 U/L (ref 0–44)
AST: 29 U/L (ref 15–41)
Albumin: 4 g/dL (ref 3.5–5.0)
Alkaline Phosphatase: 67 U/L (ref 38–126)
Anion gap: 5 (ref 5–15)
BUN: 17 mg/dL (ref 8–23)
CO2: 30 mmol/L (ref 22–32)
Calcium: 9.5 mg/dL (ref 8.9–10.3)
Chloride: 103 mmol/L (ref 98–111)
Creatinine: 1.05 mg/dL (ref 0.61–1.24)
GFR, Estimated: >60 mL/min (ref >60)
Glucose, Bld: 100 mg/dL — ABNORMAL HIGH (ref 70–99)
Potassium: 4 mmol/L (ref 3.5–5.1)
Sodium: 138 mmol/L (ref 135–145)
Total Bilirubin: 0.6 mg/dL (ref 0.3–1.2)
Total Protein: 6.7 g/dL (ref 6.5–8.1)

## 2020-02-11 ENCOUNTER — Ambulatory Visit (HOSPITAL_COMMUNITY)
Admission: RE | Admit: 2020-02-11 | Discharge: 2020-02-11 | Disposition: A | Discharge status: Home / Self Care | Payer: Medicare Other | Source: Ambulatory Visit | Attending: Internal Medicine | Admitting: Internal Medicine

## 2020-02-11 DIAGNOSIS — K8020 Calculus of gallbladder without cholecystitis without obstruction: Secondary | ICD-10-CM | POA: Diagnosis not present

## 2020-02-11 DIAGNOSIS — R918 Other nonspecific abnormal finding of lung field: Secondary | ICD-10-CM | POA: Diagnosis not present

## 2020-02-11 DIAGNOSIS — K5730 Diverticulosis of large intestine without perforation or abscess without bleeding: Secondary | ICD-10-CM | POA: Diagnosis not present

## 2020-02-11 DIAGNOSIS — K828 Other specified diseases of gallbladder: Secondary | ICD-10-CM | POA: Diagnosis not present

## 2020-02-11 DIAGNOSIS — R911 Solitary pulmonary nodule: Secondary | ICD-10-CM | POA: Diagnosis not present

## 2020-02-11 DIAGNOSIS — I700 Atherosclerosis of aorta: Secondary | ICD-10-CM | POA: Diagnosis not present

## 2020-02-11 DIAGNOSIS — C3490 Malignant neoplasm of unspecified part of unspecified bronchus or lung: Secondary | ICD-10-CM | POA: Diagnosis not present

## 2020-02-11 MED ORDER — IOHEXOL 300 MG/ML  SOLN
100.0000 mL | Freq: Once | INTRAMUSCULAR | Status: AC | PRN
  Administered 2020-02-11: 100 mL via INTRAVENOUS

## 2020-02-12 DIAGNOSIS — N400 Enlarged prostate without lower urinary tract symptoms: Secondary | ICD-10-CM | POA: Diagnosis not present

## 2020-02-12 DIAGNOSIS — E039 Hypothyroidism, unspecified: Secondary | ICD-10-CM | POA: Diagnosis not present

## 2020-02-12 DIAGNOSIS — G43009 Migraine without aura, not intractable, without status migrainosus: Secondary | ICD-10-CM | POA: Diagnosis not present

## 2020-02-12 DIAGNOSIS — E782 Mixed hyperlipidemia: Secondary | ICD-10-CM | POA: Diagnosis not present

## 2020-02-12 DIAGNOSIS — I10 Essential (primary) hypertension: Secondary | ICD-10-CM | POA: Diagnosis not present

## 2020-02-12 DIAGNOSIS — I208 Other forms of angina pectoris: Secondary | ICD-10-CM | POA: Diagnosis not present

## 2020-02-12 DIAGNOSIS — K219 Gastro-esophageal reflux disease without esophagitis: Secondary | ICD-10-CM | POA: Diagnosis not present

## 2020-02-12 DIAGNOSIS — I25110 Atherosclerotic heart disease of native coronary artery with unstable angina pectoris: Secondary | ICD-10-CM | POA: Diagnosis not present

## 2020-02-12 DIAGNOSIS — C3490 Malignant neoplasm of unspecified part of unspecified bronchus or lung: Secondary | ICD-10-CM | POA: Diagnosis not present

## 2020-02-12 DIAGNOSIS — D649 Anemia, unspecified: Secondary | ICD-10-CM | POA: Diagnosis not present

## 2020-02-12 DIAGNOSIS — J439 Emphysema, unspecified: Secondary | ICD-10-CM | POA: Diagnosis not present

## 2020-02-12 DIAGNOSIS — E785 Hyperlipidemia, unspecified: Secondary | ICD-10-CM | POA: Diagnosis not present

## 2020-02-13 ENCOUNTER — Other Ambulatory Visit: Payer: Self-pay | Admitting: Interventional Cardiology

## 2020-02-13 ENCOUNTER — Inpatient Hospital Stay (HOSPITAL_BASED_OUTPATIENT_CLINIC_OR_DEPARTMENT_OTHER): Payer: Medicare Other | Admitting: Internal Medicine

## 2020-02-13 ENCOUNTER — Other Ambulatory Visit: Payer: Self-pay

## 2020-02-13 ENCOUNTER — Encounter: Payer: Self-pay | Admitting: Internal Medicine

## 2020-02-13 ENCOUNTER — Other Ambulatory Visit: Payer: Self-pay | Admitting: Medical Oncology

## 2020-02-13 VITALS — BP 134/71 | HR 56 | Temp 97.5°F | Resp 18 | Ht 72.0 in | Wt 238.9 lb

## 2020-02-13 DIAGNOSIS — C3431 Malignant neoplasm of lower lobe, right bronchus or lung: Secondary | ICD-10-CM

## 2020-02-13 DIAGNOSIS — I10 Essential (primary) hypertension: Secondary | ICD-10-CM | POA: Diagnosis not present

## 2020-02-13 DIAGNOSIS — Z5111 Encounter for antineoplastic chemotherapy: Secondary | ICD-10-CM | POA: Diagnosis not present

## 2020-02-13 DIAGNOSIS — Z23 Encounter for immunization: Secondary | ICD-10-CM

## 2020-02-13 DIAGNOSIS — K5730 Diverticulosis of large intestine without perforation or abscess without bleeding: Secondary | ICD-10-CM | POA: Diagnosis not present

## 2020-02-13 DIAGNOSIS — K8020 Calculus of gallbladder without cholecystitis without obstruction: Secondary | ICD-10-CM | POA: Diagnosis not present

## 2020-02-13 DIAGNOSIS — I700 Atherosclerosis of aorta: Secondary | ICD-10-CM | POA: Diagnosis not present

## 2020-02-13 DIAGNOSIS — I209 Angina pectoris, unspecified: Secondary | ICD-10-CM | POA: Diagnosis not present

## 2020-02-13 MED ORDER — INFLUENZA VAC A&B SA ADJ QUAD 0.5 ML IM PRSY
PREFILLED_SYRINGE | INTRAMUSCULAR | Status: AC
  Filled 2020-02-13: qty 0.5

## 2020-02-13 MED ORDER — INFLUENZA VAC A&B SA ADJ QUAD 0.5 ML IM PRSY
0.5000 mL | PREFILLED_SYRINGE | Freq: Once | INTRAMUSCULAR | Status: AC
  Administered 2020-02-13: 0.5 mL via INTRAMUSCULAR

## 2020-02-13 MED ORDER — ERLOTINIB HCL 100 MG PO TABS: 100.0000 mg | ORAL_TABLET | Freq: Every day | ORAL | 3 refills | Status: DC

## 2020-02-13 NOTE — Progress Notes (Signed)
[facility] [phone]   Fax:(336) [phone]  OFFICE PROGRESS NOTE  [name], MD 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  PRIOR THERAPY: 1) Status post right upper lobectomy under the care of Dr. Roxan Hockey on 01/23/2006.  2) Tarceva 150 mg by mouth daily as part of the BMS checkmate 370 clinical trial. Status post 6 weeks of treatment.  CURRENT THERAPY: Tarceva 100 mg by mouth daily as part of the BMS Checkmate 370 clinical trial. First dose started 07/17/2014 status post 67 months of treatment.  INTERVAL HISTORY: [name] 74 y.o. male returns to the clinic today for follow-up visit.  The patient is feeling fine today with no concerning complaints.  He denied having any skin rash or diarrhea.  He denied having any itching.  He has no chest pain, shortness of breath, cough or hemoptysis.  He denied having any weight loss or night sweats.  He has no nausea, vomiting, abdominal pain or constipation.  He continues to tolerate his treatment with Tarceva fairly well.  The patient had repeat CT scan of the chest, abdomen pelvis performed recently and he is here for evaluation and discussion of his scan results.   MEDICAL HISTORY: Past Medical History:  Diagnosis Date  . Cholelithiases 07/16/2015  . Complication of anesthesia 2007   quit breathing with bronchoscopy, had to spend night  . History of agent Orange exposure 44-45 yrs ago  . History of pneumonia  last 3-4 yrs ago   3 -4 different times  . Hypercholesterolemia   . Hypertension   . Hypothyroidism   . lung ca dx'd 2007   lung right  . Sinus disease    hx of  . Sleep apnea    uses cpap setting opf 12    ALLERGIES:  is allergic to lisinopril and prednisone.  MEDICATIONS:  Current Outpatient  Medications  Medication Sig Dispense Refill  . amitriptyline (ELAVIL) 50 MG tablet Take 50 mg by mouth at bedtime. Reported on 06/18/2015    . aspirin 81 MG tablet Take 81 mg by mouth every morning.     . Cholecalciferol 2000 units CAPS Take 2,000 Units by mouth daily.     . clindamycin (CLEOCIN T) 1 % external solution APPLY TO AFFECTED AREA(S) TWO TIMES A DAY AS NEEDED FOR SKIN IRRITATION 30 mL 0  . diltiazem (TIAZAC) 180 MG 24 hr capsule TAKE ONE CAPSULE BY MOUTH DAILY 90 capsule 3  . erlotinib (TARCEVA) 100 MG tablet Take 1 tablet (100 mg total) by mouth daily. Take on an empty stomach 1 hour before meals or 2 hours after.faxed to [facility] 90 tablet 3  . levETIRAcetam (KEPPRA) 750 MG tablet Take 750 mg by mouth at bedtime.     . metoprolol succinate (TOPROL-XL) 50 MG 24 hr tablet Take 1 tablet (50 mg total) by mouth daily. Take with or immediately following a meal. 90 tablet 3  . Multiple Vitamins-Minerals (CENTRUM SILVER PO) Take 1 tablet by mouth daily.     . nitroGLYCERIN (NITROSTAT) 0.4 MG SL tablet PLACE ONE TABLET UNDER THE TONGUE EVERY 5 MINUTES AS NEEDED FOR CHEST PAIN 25 tablet 10  . polyethylene glycol (MIRALAX / GLYCOLAX) packet Take 17 g by mouth daily. Reported on 08/27/2015    . psyllium (METAMUCIL) 58.6 %  powder Take 1 packet by mouth daily. Reported on 08/27/2015    . rosuvastatin (CRESTOR) 5 MG tablet Take 5 mg by mouth every morning.     [name] SYNTHROID 112 MCG tablet Take 112 mcg by mouth daily.    . valsartan-hydrochlorothiazide (DIOVAN-HCT) 320-25 MG per tablet Take 1 tablet by mouth every morning.      No current facility-administered medications for this visit.    SURGICAL HISTORY:  Past Surgical History:  Procedure Laterality Date  . BIOPSY  05/21/2018   Procedure: BIOPSY;  Surgeon: Wonda Horner, MD;  Location: [facility];  Service: Endoscopy;;  . COLONOSCOPY WITH PROPOFOL N/A 03/19/2013   Procedure: COLONOSCOPY WITH PROPOFOL;  Surgeon: Garlan Fair, MD;   Location: [facility];  Service: Endoscopy;  Laterality: N/A;  . Cystourethroscopy, [facility] TURP.  04/16/2010  . ESOPHAGOGASTRODUODENOSCOPY (EGD) WITH PROPOFOL N/A 05/21/2018   Procedure: ESOPHAGOGASTRODUODENOSCOPY (EGD) WITH PROPOFOL;  Surgeon: Wonda Horner, MD;  Location: [facility];  Service: Endoscopy;  Laterality: N/A;  . NASAL SINUS SURGERY  1987  . Right video-assisted thoracoscopy, right upper lobectomy and  01/23/2006  . The Olympus video bronchoscope was introduced via the right  12/21/2005  . TONSILLECTOMY  1957  . Torn medial and lateral menisci, left knee.  11/06/2006    REVIEW OF SYSTEMS:  Constitutional: negative Eyes: negative Ears, nose, mouth, throat, and face: negative Respiratory: negative Cardiovascular: negative Gastrointestinal: negative Genitourinary:negative Integument/breast: negative Hematologic/lymphatic: negative Musculoskeletal:negative Neurological: negative Behavioral/Psych: negative Endocrine: negative Allergic/Immunologic: negative   PHYSICAL EXAMINATION: General appearance: alert, cooperative and no distress Head: Normocephalic, without obvious abnormality, atraumatic Neck: no adenopathy, no JVD, supple, symmetrical, trachea midline and thyroid not enlarged, symmetric, no tenderness/mass/nodules Lymph nodes: Cervical, supraclavicular, and axillary nodes normal. Resp: clear to auscultation bilaterally Back: symmetric, no curvature. ROM normal. No CVA tenderness. Cardio: regular rate and rhythm, S1, S2 normal, no murmur, click, rub or gallop GI: soft, non-tender; bowel sounds normal; no masses,  no organomegaly Extremities: extremities normal, atraumatic, no cyanosis or edema Neurologic: Alert and oriented X 3, normal strength and tone. Normal symmetric reflexes. Normal coordination and gait  ECOG PERFORMANCE STATUS: 1 - Symptomatic but completely ambulatory  Blood pressure 134/71, pulse (!) 56, temperature (!) 97.5 F (36.4 C), temperature  source Tympanic, resp. rate 18, height 6' (1.829 m), weight 238 lb 14.4 oz (108.4 kg), SpO2 99 %.  LABORATORY DATA: Lab Results  Component Value Date   WBC 5.5 02/10/2020   HGB 13.7 02/10/2020   HCT 40.3 02/10/2020   MCV 99.8 02/10/2020   PLT 216 02/10/2020      Chemistry      Component Value Date/Time   NA 138 02/10/2020 0850   NA 141 03/11/2019 0959   NA 139 04/06/2017 0932   K 4.0 02/10/2020 0850   K 3.8 04/06/2017 0932   CL 103 02/10/2020 0850   CL 102 04/24/2012 0853   CO2 30 02/10/2020 0850   CO2 29 04/06/2017 0932   BUN 17 02/10/2020 0850   BUN 18 03/11/2019 0959   BUN 14.4 04/06/2017 0932   CREATININE 1.05 02/10/2020 0850   CREATININE 1.0 04/06/2017 0932      Component Value Date/Time   CALCIUM 9.5 02/10/2020 0850   CALCIUM 9.5 04/06/2017 0932   ALKPHOS 67 02/10/2020 0850   ALKPHOS 58 04/06/2017 0932   AST 29 02/10/2020 0850   AST 27 04/06/2017 0932   ALT 31 02/10/2020 0850   ALT 27 04/06/2017 0932   BILITOT 0.6 02/10/2020  1610   BILITOT 0.45 04/06/2017 0932       RADIOGRAPHIC STUDIES: CT Chest W Contrast  Result Date: 02/11/2020 CLINICAL DATA:  Non-small cell lung cancer staging, status post right upper lobectomy EXAM: CT CHEST, ABDOMEN, AND PELVIS WITH CONTRAST TECHNIQUE: Multidetector CT imaging of the chest, abdomen and pelvis was performed following the standard protocol during bolus administration of intravenous contrast. CONTRAST:  120m OMNIPAQUE IOHEXOL 300 MG/ML SOLN, additional oral enteric contrast COMPARISON:  10/08/2019 FINDINGS: CT CHEST FINDINGS Cardiovascular: Aortic atherosclerosis. Normal heart size. No pericardial effusion. Mediastinum/Nodes: No enlarged mediastinal, hilar, or axillary lymph nodes. Thyroid gland, trachea, and esophagus demonstrate no significant findings. Lungs/Pleura: Redemonstrated postoperative findings of right upper lobectomy. Multiple adjacent nodules of the peripheral right lower lobe are not significantly changed  compared to prior examination, largest nodule measuring 2.4 x 1.2 cm (series 6, image 66). An additional prominent nodule of the medial right upper lobe measures 1.8 x 1.0 cm, unchanged (series 6, image 48). No pleural effusion or pneumothorax. Musculoskeletal: No chest wall mass or suspicious bone lesions identified. CT ABDOMEN PELVIS FINDINGS Hepatobiliary: No solid liver abnormality is seen. Gallstone in the gallbladder. Gallbladder wall thickening, or biliary dilatation. Pancreas: Unremarkable. No pancreatic ductal dilatation or surrounding inflammatory changes. Spleen: Normal in size without significant abnormality. Adrenals/Urinary Tract: Adrenal glands are unremarkable. Kidneys are normal, without renal calculi, solid lesion, or hydronephrosis. Bladder is unremarkable. Stomach/Bowel: Stomach is within normal limits. Appendix appears normal. No evidence of bowel wall thickening, distention, or inflammatory changes. Sigmoid diverticulosis. Vascular/Lymphatic: Aortic atherosclerosis. No enlarged abdominal or pelvic lymph nodes. Reproductive: No mass or other abnormality. Other: No abdominal wall hernia or abnormality. No abdominopelvic ascites. Musculoskeletal: No acute or significant osseous findings. IMPRESSION: 1. Redemonstrated postoperative findings of right upper lobectomy. 2. Multiple unchanged nodules of the right lower lobe. 3. No evidence of metastatic disease within the abdomen or pelvis. 4. Cholelithiasis. 5. Aortic Atherosclerosis (ICD10-I70.0). Electronically Signed   By: AEddie CandleM.D.   On: 02/11/2020 15:27   CT Abdomen Pelvis W Contrast  Result Date: 02/11/2020 CLINICAL DATA:  Non-small cell lung cancer staging, status post right upper lobectomy EXAM: CT CHEST, ABDOMEN, AND PELVIS WITH CONTRAST TECHNIQUE: Multidetector CT imaging of the chest, abdomen and pelvis was performed following the standard protocol during bolus administration of intravenous contrast. CONTRAST:  1050mOMNIPAQUE  IOHEXOL 300 MG/ML SOLN, additional oral enteric contrast COMPARISON:  10/08/2019 FINDINGS: CT CHEST FINDINGS Cardiovascular: Aortic atherosclerosis. Normal heart size. No pericardial effusion. Mediastinum/Nodes: No enlarged mediastinal, hilar, or axillary lymph nodes. Thyroid gland, trachea, and esophagus demonstrate no significant findings. Lungs/Pleura: Redemonstrated postoperative findings of right upper lobectomy. Multiple adjacent nodules of the peripheral right lower lobe are not significantly changed compared to prior examination, largest nodule measuring 2.4 x 1.2 cm (series 6, image 66). An additional prominent nodule of the medial right upper lobe measures 1.8 x 1.0 cm, unchanged (series 6, image 48). No pleural effusion or pneumothorax. Musculoskeletal: No chest wall mass or suspicious bone lesions identified. CT ABDOMEN PELVIS FINDINGS Hepatobiliary: No solid liver abnormality is seen. Gallstone in the gallbladder. Gallbladder wall thickening, or biliary dilatation. Pancreas: Unremarkable. No pancreatic ductal dilatation or surrounding inflammatory changes. Spleen: Normal in size without significant abnormality. Adrenals/Urinary Tract: Adrenal glands are unremarkable. Kidneys are normal, without renal calculi, solid lesion, or hydronephrosis. Bladder is unremarkable. Stomach/Bowel: Stomach is within normal limits. Appendix appears normal. No evidence of bowel wall thickening, distention, or inflammatory changes. Sigmoid diverticulosis. Vascular/Lymphatic: Aortic atherosclerosis. No enlarged abdominal  or pelvic lymph nodes. Reproductive: No mass or other abnormality. Other: No abdominal wall hernia or abnormality. No abdominopelvic ascites. Musculoskeletal: No acute or significant osseous findings. IMPRESSION: 1. Redemonstrated postoperative findings of right upper lobectomy. 2. Multiple unchanged nodules of the right lower lobe. 3. No evidence of metastatic disease within the abdomen or pelvis. 4.  Cholelithiasis. 5. Aortic Atherosclerosis (ICD10-I70.0). Electronically Signed   By: Eddie Candle M.D.   On: 02/11/2020 15:27    ASSESSMENT AND PLAN:  This is a very pleasant 74 years old white male with recurrent non-small cell lung cancer, adenocarcinoma and positive EGFR mutation in exon 21. He is currently on Tarceva 100 mg by mouth daily status post around 67 months of treatment. He has been tolerating this treatment well with no concerning adverse effects. He had repeat CT scan of the chest, abdomen pelvis performed recently.  I personally and independently reviewed the scans and discussed the results with the patient today. Has a scan showed no concerning findings for disease progression. I recommended for him to continue his current treatment with Tarceva 100 mg p.o. daily. The patient will come back for follow-up visit in 2 months for evaluation and repeat blood work. He will receive his flu vaccine in the clinic today. He was advised to call immediately if he has any concerning symptoms in the interval. The patient voices understanding of current disease status and treatment options and is in agreement with the current care plan. All questions were answered. The patient knows to call the clinic with any problems, questions or concerns. We can certainly see the patient much sooner if necessary.  Disclaimer: This note was dictated with voice recognition software. Similar sounding words can inadvertently be transcribed and may not be corrected upon review.

## 2020-02-24 ENCOUNTER — Other Ambulatory Visit: Payer: Self-pay | Admitting: Physician Assistant

## 2020-02-24 ENCOUNTER — Ambulatory Visit
Admission: RE | Admit: 2020-02-24 | Discharge: 2020-02-24 | Disposition: A | Discharge status: Home / Self Care | Payer: Medicare Other | Source: Ambulatory Visit | Attending: Physician Assistant | Admitting: Physician Assistant

## 2020-02-24 DIAGNOSIS — M1711 Unilateral primary osteoarthritis, right knee: Secondary | ICD-10-CM | POA: Diagnosis not present

## 2020-02-24 DIAGNOSIS — M25461 Effusion, right knee: Secondary | ICD-10-CM

## 2020-03-06 DIAGNOSIS — M25561 Pain in right knee: Secondary | ICD-10-CM | POA: Diagnosis not present

## 2020-03-09 ENCOUNTER — Telehealth: Payer: Self-pay | Admitting: *Deleted

## 2020-03-09 NOTE — Telephone Encounter (Signed)
Pt states he developed a rash and itching this weekend on his feet and ankles. PCP told him to take benadryl. Wants to know if that is OK. States he had a steroid injection last week in his knee. Currently taking Tarceva X 5 years.  Please advise

## 2020-03-09 NOTE — Telephone Encounter (Signed)
Okay to take Benadryl and continue his current treatment with Tarceva.  Thank you.

## 2020-03-10 NOTE — Telephone Encounter (Signed)
Pt instructed to take benadryl . He stated he got a steroid injection in knee recently  ( rash allergy to prednisone listed on his MAR) . He said the rash may be from injection.

## 2020-04-10 DIAGNOSIS — I208 Other forms of angina pectoris: Secondary | ICD-10-CM | POA: Diagnosis not present

## 2020-04-10 DIAGNOSIS — E785 Hyperlipidemia, unspecified: Secondary | ICD-10-CM | POA: Diagnosis not present

## 2020-04-10 DIAGNOSIS — D649 Anemia, unspecified: Secondary | ICD-10-CM | POA: Diagnosis not present

## 2020-04-10 DIAGNOSIS — E782 Mixed hyperlipidemia: Secondary | ICD-10-CM | POA: Diagnosis not present

## 2020-04-10 DIAGNOSIS — E039 Hypothyroidism, unspecified: Secondary | ICD-10-CM | POA: Diagnosis not present

## 2020-04-10 DIAGNOSIS — I25110 Atherosclerotic heart disease of native coronary artery with unstable angina pectoris: Secondary | ICD-10-CM | POA: Diagnosis not present

## 2020-04-10 DIAGNOSIS — N400 Enlarged prostate without lower urinary tract symptoms: Secondary | ICD-10-CM | POA: Diagnosis not present

## 2020-04-10 DIAGNOSIS — K219 Gastro-esophageal reflux disease without esophagitis: Secondary | ICD-10-CM | POA: Diagnosis not present

## 2020-04-10 DIAGNOSIS — J439 Emphysema, unspecified: Secondary | ICD-10-CM | POA: Diagnosis not present

## 2020-04-10 DIAGNOSIS — C3490 Malignant neoplasm of unspecified part of unspecified bronchus or lung: Secondary | ICD-10-CM | POA: Diagnosis not present

## 2020-04-10 DIAGNOSIS — I10 Essential (primary) hypertension: Secondary | ICD-10-CM | POA: Diagnosis not present

## 2020-04-16 ENCOUNTER — Other Ambulatory Visit: Payer: Self-pay

## 2020-04-16 ENCOUNTER — Encounter: Payer: Self-pay | Admitting: Internal Medicine

## 2020-04-16 ENCOUNTER — Inpatient Hospital Stay (HOSPITAL_BASED_OUTPATIENT_CLINIC_OR_DEPARTMENT_OTHER): Payer: Medicare Other | Admitting: Internal Medicine

## 2020-04-16 ENCOUNTER — Inpatient Hospital Stay: Payer: Medicare Other | Attending: Internal Medicine

## 2020-04-16 ENCOUNTER — Telehealth: Payer: Self-pay | Admitting: Internal Medicine

## 2020-04-16 VITALS — BP 137/75 | HR 64 | Temp 97.6°F | Resp 17 | Ht 73.0 in | Wt 239.4 lb

## 2020-04-16 DIAGNOSIS — Z7982 Long term (current) use of aspirin: Secondary | ICD-10-CM | POA: Diagnosis not present

## 2020-04-16 DIAGNOSIS — E039 Hypothyroidism, unspecified: Secondary | ICD-10-CM | POA: Diagnosis not present

## 2020-04-16 DIAGNOSIS — Z5111 Encounter for antineoplastic chemotherapy: Secondary | ICD-10-CM | POA: Diagnosis not present

## 2020-04-16 DIAGNOSIS — Z79899 Other long term (current) drug therapy: Secondary | ICD-10-CM | POA: Insufficient documentation

## 2020-04-16 DIAGNOSIS — G4730 Sleep apnea, unspecified: Secondary | ICD-10-CM | POA: Diagnosis not present

## 2020-04-16 DIAGNOSIS — C3431 Malignant neoplasm of lower lobe, right bronchus or lung: Secondary | ICD-10-CM

## 2020-04-16 DIAGNOSIS — I10 Essential (primary) hypertension: Secondary | ICD-10-CM | POA: Insufficient documentation

## 2020-04-16 DIAGNOSIS — E7800 Pure hypercholesterolemia, unspecified: Secondary | ICD-10-CM | POA: Insufficient documentation

## 2020-04-16 LAB — CMP (CANCER CENTER ONLY)
ALT: 33 U/L (ref 0–44)
AST: 26 U/L (ref 15–41)
Albumin: 3.9 g/dL (ref 3.5–5.0)
Alkaline Phosphatase: 60 U/L (ref 38–126)
Anion gap: 7 (ref 5–15)
BUN: 22 mg/dL (ref 8–23)
CO2: 29 mmol/L (ref 22–32)
Calcium: 9.5 mg/dL (ref 8.9–10.3)
Chloride: 101 mmol/L (ref 98–111)
Creatinine: 1.05 mg/dL (ref 0.61–1.24)
GFR, Estimated: >60 mL/min (ref >60)
Glucose, Bld: 104 mg/dL — ABNORMAL HIGH (ref 70–99)
Potassium: 3.8 mmol/L (ref 3.5–5.1)
Sodium: 137 mmol/L (ref 135–145)
Total Bilirubin: 0.8 mg/dL (ref 0.3–1.2)
Total Protein: 6.6 g/dL (ref 6.5–8.1)

## 2020-04-16 LAB — CBC WITH DIFFERENTIAL (CANCER CENTER ONLY)
Abs Immature Granulocytes: 0.02 10*3/uL (ref 0.00–0.07)
Basophils Absolute: 0 10*3/uL (ref 0.0–0.1)
Basophils Relative: 1 %
Eosinophils Absolute: 0.3 10*3/uL (ref 0.0–0.5)
Eosinophils Relative: 4 %
HCT: 39.9 % (ref 39.0–52.0)
Hemoglobin: 13.6 g/dL (ref 13.0–17.0)
Immature Granulocytes: 0 %
Lymphocytes Relative: 41 %
Lymphs Abs: 2.6 10*3/uL (ref 0.7–4.0)
MCH: 34.8 pg — ABNORMAL HIGH (ref 26.0–34.0)
MCHC: 34.1 g/dL (ref 30.0–36.0)
MCV: 102 fL — ABNORMAL HIGH (ref 80.0–100.0)
Monocytes Absolute: 0.6 10*3/uL (ref 0.1–1.0)
Monocytes Relative: 10 %
Neutro Abs: 2.9 10*3/uL (ref 1.7–7.7)
Neutrophils Relative %: 44 %
Platelet Count: 190 10*3/uL (ref 150–400)
RBC: 3.91 MIL/uL — ABNORMAL LOW (ref 4.22–5.81)
RDW: 12.5 % (ref 11.5–15.5)
WBC Count: 6.4 10*3/uL (ref 4.0–10.5)
nRBC: 0 % (ref 0.0–0.2)

## 2020-04-16 NOTE — Telephone Encounter (Signed)
Scheduled appointments per 1/13 los. Spoke to patient who is aware of appointments date and times.

## 2020-04-16 NOTE — Progress Notes (Signed)
[place] [phone]   Fax:([phone]  OFFICE PROGRESS NOTE  [name], MD 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  PRIOR THERAPY: 1) Status post right upper lobectomy under the care of Dr. Roxan Hockey on 01/23/2006.  2) Tarceva 150 mg by mouth daily as part of the BMS checkmate 370 clinical trial. Status post 6 weeks of treatment.  CURRENT THERAPY: Tarceva 100 mg by mouth daily as part of the BMS Checkmate 370 clinical trial. First dose started 07/17/2014 status post 69 months of treatment.  INTERVAL HISTORY: [name] 75 y.o. male returns to the clinic today for follow-up visit.  The patient is feeling fine today with no concerning complaints except for dry skin and some skin breaks in the palms of his hands.  He is using lotions and wearing gloves at nighttime.  He denied having any current chest pain, shortness of breath, cough or hemoptysis.  He denied having any fever or chills.  He has no nausea, vomiting, diarrhea or constipation.  He has no headache or visual changes.  He is here today for evaluation and repeat blood work.  MEDICAL HISTORY: Past Medical History:  Diagnosis Date  . Cholelithiases 07/16/2015  . Complication of anesthesia 2007   quit breathing with bronchoscopy, had to spend night  . History of agent Orange exposure 44-45 yrs ago  . History of pneumonia  last 3-4 yrs ago   3 -4 different times  . Hypercholesterolemia   . Hypertension   . Hypothyroidism   . lung ca dx'd 2007   lung right  . Sinus disease    hx of  . Sleep apnea    uses cpap setting opf 12    ALLERGIES:  is allergic to lisinopril and prednisone.  MEDICATIONS:  Current Outpatient Medications  Medication Sig Dispense Refill  . amitriptyline (ELAVIL) 50  MG tablet Take 50 mg by mouth at bedtime. Reported on 06/18/2015    . aspirin 81 MG tablet Take 81 mg by mouth every morning.     . Cholecalciferol 2000 units CAPS Take 2,000 Units by mouth daily.     . clindamycin (CLEOCIN T) 1 % external solution APPLY TO AFFECTED AREA(S) TWO TIMES A DAY AS NEEDED FOR SKIN IRRITATION 30 mL 0  . diltiazem (TIAZAC) 180 MG 24 hr capsule TAKE ONE CAPSULE BY MOUTH DAILY 90 capsule 1  . erlotinib (TARCEVA) 100 MG tablet Take 1 tablet (100 mg total) by mouth daily. Take on an empty stomach 1 hour before meals or 2 hours after.faxed to [facility] 90 tablet 3  . levETIRAcetam (KEPPRA) 750 MG tablet Take 750 mg by mouth at bedtime.     . metoprolol succinate (TOPROL-XL) 50 MG 24 hr tablet Take 1 tablet (50 mg total) by mouth daily. Take with or immediately following a meal. 90 tablet 3  . Multiple Vitamins-Minerals (CENTRUM SILVER PO) Take 1 tablet by mouth daily.     . nitroGLYCERIN (NITROSTAT) 0.4 MG SL tablet PLACE ONE TABLET UNDER THE TONGUE EVERY 5 MINUTES AS NEEDED FOR CHEST PAIN 25 tablet 10  . polyethylene glycol (MIRALAX / GLYCOLAX) packet Take 17 g by mouth daily. Reported on 08/27/2015    . Polyethylene Glycol POWD See admin instructions.    . psyllium (METAMUCIL) 58.6 % powder  Take 1 packet by mouth daily. Reported on 08/27/2015    . rosuvastatin (CRESTOR) 5 MG tablet Take 5 mg by mouth every morning.     [name] SYNTHROID 112 MCG tablet Take 112 mcg by mouth daily.    . valsartan-hydrochlorothiazide (DIOVAN-HCT) 320-25 MG per tablet Take 1 tablet by mouth every morning.      No current facility-administered medications for this visit.    SURGICAL HISTORY:  Past Surgical History:  Procedure Laterality Date  . BIOPSY  05/21/2018   Procedure: BIOPSY;  Surgeon: Wonda Horner, MD;  Location: [facility];  Service: Endoscopy;;  . COLONOSCOPY WITH PROPOFOL N/A 03/19/2013   Procedure: COLONOSCOPY WITH PROPOFOL;  Surgeon: Garlan Fair, MD;  Location: [facility];   Service: Endoscopy;  Laterality: N/A;  . Cystourethroscopy, [facility] TURP.  04/16/2010  . ESOPHAGOGASTRODUODENOSCOPY (EGD) WITH PROPOFOL N/A 05/21/2018   Procedure: ESOPHAGOGASTRODUODENOSCOPY (EGD) WITH PROPOFOL;  Surgeon: Wonda Horner, MD;  Location: [facility];  Service: Endoscopy;  Laterality: N/A;  . NASAL SINUS SURGERY  1987  . Right video-assisted thoracoscopy, right upper lobectomy and  01/23/2006  . The Olympus video bronchoscope was introduced via the right  12/21/2005  . TONSILLECTOMY  1957  . Torn medial and lateral menisci, left knee.  11/06/2006    REVIEW OF SYSTEMS:  A comprehensive review of systems was negative except for: Integument/breast: positive for dryness   PHYSICAL EXAMINATION: General appearance: alert, cooperative and no distress Head: Normocephalic, without obvious abnormality, atraumatic Neck: no adenopathy, no JVD, supple, symmetrical, trachea midline and thyroid not enlarged, symmetric, no tenderness/mass/nodules Lymph nodes: Cervical, supraclavicular, and axillary nodes normal. Resp: clear to auscultation bilaterally Back: symmetric, no curvature. ROM normal. No CVA tenderness. Cardio: regular rate and rhythm, S1, S2 normal, no murmur, click, rub or gallop GI: soft, non-tender; bowel sounds normal; no masses,  no organomegaly Extremities: extremities normal, atraumatic, no cyanosis or edema  ECOG PERFORMANCE STATUS: 1 - Symptomatic but completely ambulatory  Blood pressure 137/75, pulse 64, temperature 97.6 F (36.4 C), temperature source Tympanic, resp. rate 17, height '6\' 1"'  (1.854 m), weight 239 lb 6.4 oz (108.6 kg), SpO2 100 %.  LABORATORY DATA: Lab Results  Component Value Date   WBC 6.4 04/16/2020   HGB 13.6 04/16/2020   HCT 39.9 04/16/2020   MCV 102.0 (H) 04/16/2020   PLT 190 04/16/2020      Chemistry      Component Value Date/Time   NA 138 02/10/2020 0850   NA 141 03/11/2019 0959   NA 139 04/06/2017 0932   K 4.0 02/10/2020 0850   K  3.8 04/06/2017 0932   CL 103 02/10/2020 0850   CL 102 04/24/2012 0853   CO2 30 02/10/2020 0850   CO2 29 04/06/2017 0932   BUN 17 02/10/2020 0850   BUN 18 03/11/2019 0959   BUN 14.4 04/06/2017 0932   CREATININE 1.05 02/10/2020 0850   CREATININE 1.0 04/06/2017 0932      Component Value Date/Time   CALCIUM 9.5 02/10/2020 0850   CALCIUM 9.5 04/06/2017 0932   ALKPHOS 67 02/10/2020 0850   ALKPHOS 58 04/06/2017 0932   AST 29 02/10/2020 0850   AST 27 04/06/2017 0932   ALT 31 02/10/2020 0850   ALT 27 04/06/2017 0932   BILITOT 0.6 02/10/2020 0850   BILITOT 0.45 04/06/2017 0932       RADIOGRAPHIC STUDIES: No results found.  ASSESSMENT AND PLAN:  This is a very pleasant 75 years old white male with recurrent non-small  cell lung cancer, adenocarcinoma and positive EGFR mutation in exon 21. He is currently on Tarceva 100 mg by mouth daily status post around 69 months of treatment. The patient has been tolerating this treatment well with no concerning adverse effects except for the dry skin and skin breaks in the palms of his hand. He is using a lot of moisture for these skin lesions. I recommended for him to continue his current treatment with Tarceva with the same dose.  I will see him back for follow-up visit in 2 months for evaluation and repeat blood work. We will start doing his scan on every 6 months basis at this point since the patient has been doing well for the last several years. He was advised to call immediately if he has any concerning symptoms in the interval. The patient voices understanding of current disease status and treatment options and is in agreement with the current care plan. All questions were answered. The patient knows to call the clinic with any problems, questions or concerns. We can certainly see the patient much sooner if necessary.  Disclaimer: This note was dictated with voice recognition software. Similar sounding words can inadvertently be transcribed and  may not be corrected upon review.

## 2020-06-11 ENCOUNTER — Telehealth: Payer: Self-pay | Admitting: Internal Medicine

## 2020-06-11 ENCOUNTER — Encounter: Payer: Self-pay | Admitting: Internal Medicine

## 2020-06-11 ENCOUNTER — Inpatient Hospital Stay (HOSPITAL_BASED_OUTPATIENT_CLINIC_OR_DEPARTMENT_OTHER): Payer: Medicare Other | Admitting: Internal Medicine

## 2020-06-11 ENCOUNTER — Other Ambulatory Visit: Payer: Self-pay

## 2020-06-11 ENCOUNTER — Inpatient Hospital Stay: Payer: Medicare Other | Attending: Internal Medicine

## 2020-06-11 VITALS — BP 152/80 | HR 66 | Temp 96.2°F | Resp 13 | Ht 73.0 in | Wt 240.7 lb

## 2020-06-11 DIAGNOSIS — C3490 Malignant neoplasm of unspecified part of unspecified bronchus or lung: Secondary | ICD-10-CM | POA: Diagnosis not present

## 2020-06-11 DIAGNOSIS — Z5111 Encounter for antineoplastic chemotherapy: Secondary | ICD-10-CM

## 2020-06-11 DIAGNOSIS — C3431 Malignant neoplasm of lower lobe, right bronchus or lung: Secondary | ICD-10-CM

## 2020-06-11 LAB — CBC WITH DIFFERENTIAL (CANCER CENTER ONLY)
Abs Immature Granulocytes: 0.02 10*3/uL (ref 0.00–0.07)
Basophils Absolute: 0 10*3/uL (ref 0.0–0.1)
Basophils Relative: 1 %
Eosinophils Absolute: 0.3 10*3/uL (ref 0.0–0.5)
Eosinophils Relative: 5 %
HCT: 39.3 % (ref 39.0–52.0)
Hemoglobin: 13.7 g/dL (ref 13.0–17.0)
Immature Granulocytes: 0 %
Lymphocytes Relative: 46 %
Lymphs Abs: 2.7 10*3/uL (ref 0.7–4.0)
MCH: 34.9 pg — ABNORMAL HIGH (ref 26.0–34.0)
MCHC: 34.9 g/dL (ref 30.0–36.0)
MCV: 100.3 fL — ABNORMAL HIGH (ref 80.0–100.0)
Monocytes Absolute: 0.6 10*3/uL (ref 0.1–1.0)
Monocytes Relative: 10 %
Neutro Abs: 2.2 10*3/uL (ref 1.7–7.7)
Neutrophils Relative %: 38 %
Platelet Count: 180 10*3/uL (ref 150–400)
RBC: 3.92 MIL/uL — ABNORMAL LOW (ref 4.22–5.81)
RDW: 12.2 % (ref 11.5–15.5)
WBC Count: 5.7 10*3/uL (ref 4.0–10.5)
nRBC: 0 % (ref 0.0–0.2)

## 2020-06-11 LAB — CMP (CANCER CENTER ONLY)
ALT: 31 U/L (ref 0–44)
AST: 28 U/L (ref 15–41)
Albumin: 4.1 g/dL (ref 3.5–5.0)
Alkaline Phosphatase: 59 U/L (ref 38–126)
Anion gap: 5 (ref 5–15)
BUN: 16 mg/dL (ref 8–23)
CO2: 28 mmol/L (ref 22–32)
Calcium: 9.4 mg/dL (ref 8.9–10.3)
Chloride: 103 mmol/L (ref 98–111)
Creatinine: 1.01 mg/dL (ref 0.61–1.24)
GFR, Estimated: >60 mL/min (ref >60)
Glucose, Bld: 100 mg/dL — ABNORMAL HIGH (ref 70–99)
Potassium: 3.6 mmol/L (ref 3.5–5.1)
Sodium: 136 mmol/L (ref 135–145)
Total Bilirubin: 0.7 mg/dL (ref 0.3–1.2)
Total Protein: 6.6 g/dL (ref 6.5–8.1)

## 2020-06-11 NOTE — Progress Notes (Signed)
[facility] [place] [phone]   Fax:(336) [phone]  OFFICE PROGRESS NOTE  [name], MD 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  PRIOR THERAPY: 1) Status post right upper lobectomy under the care of Dr. Roxan Hockey on 01/23/2006.  2) Tarceva 150 mg by mouth daily as part of the BMS checkmate 370 clinical trial. Status post 6 weeks of treatment.  CURRENT THERAPY: Tarceva 100 mg by mouth daily as part of the BMS Checkmate 370 clinical trial. First dose started 07/17/2014 status post 71 months of treatment.  INTERVAL HISTORY: [name] 75 y.o. male returns to the clinic today for follow-up visit.  The patient is feeling fine today with no concerning complaints.  She denied having any skin rash or diarrhea.  He has no weight loss or night sweats.  He has no nausea, vomiting, diarrhea or constipation.  He has no chest pain, shortness of breath, cough or hemoptysis.  He continues to tolerate his treatment with Tarceva fairly well.  The patient is here today for evaluation and repeat blood work.   MEDICAL HISTORY: Past Medical History:  Diagnosis Date  . Cholelithiases 07/16/2015  . Complication of anesthesia 2007   quit breathing with bronchoscopy, had to spend night  . History of agent Orange exposure 44-45 yrs ago  . History of pneumonia  last 3-4 yrs ago   3 -4 different times  . Hypercholesterolemia   . Hypertension   . Hypothyroidism   . lung ca dx'd 2007   lung right  . Sinus disease    hx of  . Sleep apnea    uses cpap setting opf 12    ALLERGIES:  is allergic to lisinopril and prednisone.  MEDICATIONS:  Current Outpatient Medications  Medication Sig Dispense Refill  . amitriptyline (ELAVIL) 50 MG tablet Take 50 mg by mouth at bedtime. Reported on  06/18/2015    . aspirin 81 MG tablet Take 81 mg by mouth every morning.     . Cholecalciferol 2000 units CAPS Take 2,000 Units by mouth daily.     . clindamycin (CLEOCIN T) 1 % external solution APPLY TO AFFECTED AREA(S) TWO TIMES A DAY AS NEEDED FOR SKIN IRRITATION 30 mL 0  . diltiazem (TIAZAC) 180 MG 24 hr capsule TAKE ONE CAPSULE BY MOUTH DAILY 90 capsule 1  . erlotinib (TARCEVA) 100 MG tablet Take 1 tablet (100 mg total) by mouth daily. Take on an empty stomach 1 hour before meals or 2 hours after.faxed to [facility] 90 tablet 3  . levETIRAcetam (KEPPRA) 750 MG tablet Take 750 mg by mouth at bedtime.     . metoprolol succinate (TOPROL-XL) 50 MG 24 hr tablet Take 1 tablet (50 mg total) by mouth daily. Take with or immediately following a meal. 90 tablet 3  . Multiple Vitamins-Minerals (CENTRUM SILVER PO) Take 1 tablet by mouth daily.     . nitroGLYCERIN (NITROSTAT) 0.4 MG SL tablet PLACE ONE TABLET UNDER THE TONGUE EVERY 5 MINUTES AS NEEDED FOR CHEST PAIN 25 tablet 10  . polyethylene glycol (MIRALAX / GLYCOLAX) packet Take 17 g by mouth daily. Reported on 08/27/2015    . Polyethylene Glycol POWD See admin instructions.    . psyllium (METAMUCIL) 58.6 % powder Take 1 packet by mouth daily. Reported on 08/27/2015    .  rosuvastatin (CRESTOR) 5 MG tablet Take 5 mg by mouth every morning.     [name] SYNTHROID 112 MCG tablet Take 112 mcg by mouth daily.    . valsartan-hydrochlorothiazide (DIOVAN-HCT) 320-25 MG per tablet Take 1 tablet by mouth every morning.      No current facility-administered medications for this visit.    SURGICAL HISTORY:  Past Surgical History:  Procedure Laterality Date  . BIOPSY  05/21/2018   Procedure: BIOPSY;  Surgeon: Wonda Horner, MD;  Location: [facility];  Service: Endoscopy;;  . COLONOSCOPY WITH PROPOFOL N/A 03/19/2013   Procedure: COLONOSCOPY WITH PROPOFOL;  Surgeon: Garlan Fair, MD;  Location: [facility];  Service: Endoscopy;  Laterality: N/A;  .  Cystourethroscopy, [facility] TURP.  04/16/2010  . ESOPHAGOGASTRODUODENOSCOPY (EGD) WITH PROPOFOL N/A 05/21/2018   Procedure: ESOPHAGOGASTRODUODENOSCOPY (EGD) WITH PROPOFOL;  Surgeon: Wonda Horner, MD;  Location: [facility];  Service: Endoscopy;  Laterality: N/A;  . NASAL SINUS SURGERY  1987  . Right video-assisted thoracoscopy, right upper lobectomy and  01/23/2006  . The Olympus video bronchoscope was introduced via the right  12/21/2005  . TONSILLECTOMY  1957  . Torn medial and lateral menisci, left knee.  11/06/2006    REVIEW OF SYSTEMS:  A comprehensive review of systems was negative.   PHYSICAL EXAMINATION: General appearance: alert, cooperative and no distress Head: Normocephalic, without obvious abnormality, atraumatic Neck: no adenopathy, no JVD, supple, symmetrical, trachea midline and thyroid not enlarged, symmetric, no tenderness/mass/nodules Lymph nodes: Cervical, supraclavicular, and axillary nodes normal. Resp: clear to auscultation bilaterally Back: symmetric, no curvature. ROM normal. No CVA tenderness. Cardio: regular rate and rhythm, S1, S2 normal, no murmur, click, rub or gallop GI: soft, non-tender; bowel sounds normal; no masses,  no organomegaly Extremities: extremities normal, atraumatic, no cyanosis or edema  ECOG PERFORMANCE STATUS: 1 - Symptomatic but completely ambulatory  Blood pressure (!) 152/80, pulse 66, temperature (!) 96.2 F (35.7 C), temperature source Tympanic, resp. rate 13, height 6' 1" (1.854 m), weight 240 lb 11.2 oz (109.2 kg), SpO2 100 %.  LABORATORY DATA: Lab Results  Component Value Date   WBC 5.7 06/11/2020   HGB 13.7 06/11/2020   HCT 39.3 06/11/2020   MCV 100.3 (H) 06/11/2020   PLT 180 06/11/2020      Chemistry      Component Value Date/Time   NA 137 04/16/2020 0856   NA 141 03/11/2019 0959   NA 139 04/06/2017 0932   K 3.8 04/16/2020 0856   K 3.8 04/06/2017 0932   CL 101 04/16/2020 0856   CL 102 04/24/2012 0853   CO2 29  04/16/2020 0856   CO2 29 04/06/2017 0932   BUN 22 04/16/2020 0856   BUN 18 03/11/2019 0959   BUN 14.4 04/06/2017 0932   CREATININE 1.05 04/16/2020 0856   CREATININE 1.0 04/06/2017 0932      Component Value Date/Time   CALCIUM 9.5 04/16/2020 0856   CALCIUM 9.5 04/06/2017 0932   ALKPHOS 60 04/16/2020 0856   ALKPHOS 58 04/06/2017 0932   AST 26 04/16/2020 0856   AST 27 04/06/2017 0932   ALT 33 04/16/2020 0856   ALT 27 04/06/2017 0932   BILITOT 0.8 04/16/2020 0856   BILITOT 0.45 04/06/2017 0932       RADIOGRAPHIC STUDIES: No results found.  ASSESSMENT AND PLAN:  This is a very pleasant 75 years old white male with recurrent non-small cell lung cancer, adenocarcinoma and positive EGFR mutation in exon 21. He is currently on Tarceva 100  mg by mouth daily status post around 71 months of treatment. The patient continues to tolerate his treatment with Tarceva fairly well with no significant adverse effects. His lab work is unremarkable today. I recommended for the patient to continue his current treatment with Tarceva 100 mg p.o. daily. I will see him back for follow-up visit in 2 months for evaluation with repeat CT scan of the chest, abdomen pelvis for restaging of his disease. The patient was advised to call immediately if he has any concerning symptoms in the interval. The patient voices understanding of current disease status and treatment options and is in agreement with the current care plan. All questions were answered. The patient knows to call the clinic with any problems, questions or concerns. We can certainly see the patient much sooner if necessary.  Disclaimer: This note was dictated with voice recognition software. Similar sounding words can inadvertently be transcribed and may not be corrected upon review.

## 2020-06-11 NOTE — Telephone Encounter (Signed)
Scheduled appointments per 3/10 los. Spoke to patient who is aware of appointments dates and times. Gave patient AVS.

## 2020-06-24 DIAGNOSIS — J439 Emphysema, unspecified: Secondary | ICD-10-CM | POA: Diagnosis not present

## 2020-06-24 DIAGNOSIS — I208 Other forms of angina pectoris: Secondary | ICD-10-CM | POA: Diagnosis not present

## 2020-06-24 DIAGNOSIS — N400 Enlarged prostate without lower urinary tract symptoms: Secondary | ICD-10-CM | POA: Diagnosis not present

## 2020-06-24 DIAGNOSIS — I25110 Atherosclerotic heart disease of native coronary artery with unstable angina pectoris: Secondary | ICD-10-CM | POA: Diagnosis not present

## 2020-06-24 DIAGNOSIS — G43009 Migraine without aura, not intractable, without status migrainosus: Secondary | ICD-10-CM | POA: Diagnosis not present

## 2020-06-24 DIAGNOSIS — I10 Essential (primary) hypertension: Secondary | ICD-10-CM | POA: Diagnosis not present

## 2020-06-24 DIAGNOSIS — E782 Mixed hyperlipidemia: Secondary | ICD-10-CM | POA: Diagnosis not present

## 2020-06-24 DIAGNOSIS — K219 Gastro-esophageal reflux disease without esophagitis: Secondary | ICD-10-CM | POA: Diagnosis not present

## 2020-06-24 DIAGNOSIS — E039 Hypothyroidism, unspecified: Secondary | ICD-10-CM | POA: Diagnosis not present

## 2020-06-24 DIAGNOSIS — E785 Hyperlipidemia, unspecified: Secondary | ICD-10-CM | POA: Diagnosis not present

## 2020-06-24 DIAGNOSIS — D649 Anemia, unspecified: Secondary | ICD-10-CM | POA: Diagnosis not present

## 2020-08-10 ENCOUNTER — Inpatient Hospital Stay: Payer: Medicare Other | Attending: Internal Medicine

## 2020-08-10 ENCOUNTER — Other Ambulatory Visit: Payer: Self-pay

## 2020-08-10 DIAGNOSIS — C3490 Malignant neoplasm of unspecified part of unspecified bronchus or lung: Secondary | ICD-10-CM | POA: Diagnosis not present

## 2020-08-10 DIAGNOSIS — Z79899 Other long term (current) drug therapy: Secondary | ICD-10-CM | POA: Insufficient documentation

## 2020-08-10 LAB — CBC WITH DIFFERENTIAL (CANCER CENTER ONLY)
Abs Immature Granulocytes: 0.01 10*3/uL (ref 0.00–0.07)
Basophils Absolute: 0 10*3/uL (ref 0.0–0.1)
Basophils Relative: 1 %
Eosinophils Absolute: 0.3 10*3/uL (ref 0.0–0.5)
Eosinophils Relative: 5 %
HCT: 39.6 % (ref 39.0–52.0)
Hemoglobin: 13.7 g/dL (ref 13.0–17.0)
Immature Granulocytes: 0 %
Lymphocytes Relative: 42 %
Lymphs Abs: 2.5 10*3/uL (ref 0.7–4.0)
MCH: 35.2 pg — ABNORMAL HIGH (ref 26.0–34.0)
MCHC: 34.6 g/dL (ref 30.0–36.0)
MCV: 101.8 fL — ABNORMAL HIGH (ref 80.0–100.0)
Monocytes Absolute: 0.6 10*3/uL (ref 0.1–1.0)
Monocytes Relative: 10 %
Neutro Abs: 2.5 10*3/uL (ref 1.7–7.7)
Neutrophils Relative %: 42 %
Platelet Count: 171 10*3/uL (ref 150–400)
RBC: 3.89 MIL/uL — ABNORMAL LOW (ref 4.22–5.81)
RDW: 12 % (ref 11.5–15.5)
WBC Count: 6 10*3/uL (ref 4.0–10.5)
nRBC: 0 % (ref 0.0–0.2)

## 2020-08-10 LAB — CMP (CANCER CENTER ONLY)
ALT: 30 U/L (ref 0–44)
AST: 31 U/L (ref 15–41)
Albumin: 4 g/dL (ref 3.5–5.0)
Alkaline Phosphatase: 66 U/L (ref 38–126)
Anion gap: 10 (ref 5–15)
BUN: 18 mg/dL (ref 8–23)
CO2: 28 mmol/L (ref 22–32)
Calcium: 9.5 mg/dL (ref 8.9–10.3)
Chloride: 103 mmol/L (ref 98–111)
Creatinine: 1.06 mg/dL (ref 0.61–1.24)
GFR, Estimated: >60 mL/min (ref >60)
Glucose, Bld: 100 mg/dL — ABNORMAL HIGH (ref 70–99)
Potassium: 3.5 mmol/L (ref 3.5–5.1)
Sodium: 141 mmol/L (ref 135–145)
Total Bilirubin: 0.6 mg/dL (ref 0.3–1.2)
Total Protein: 6.6 g/dL (ref 6.5–8.1)

## 2020-08-11 ENCOUNTER — Encounter (HOSPITAL_COMMUNITY): Payer: Self-pay

## 2020-08-11 ENCOUNTER — Ambulatory Visit (HOSPITAL_COMMUNITY)
Admission: RE | Admit: 2020-08-11 | Discharge: 2020-08-11 | Disposition: A | Discharge status: Home / Self Care | Payer: Medicare Other | Source: Ambulatory Visit | Attending: Internal Medicine | Admitting: Internal Medicine

## 2020-08-11 DIAGNOSIS — C3490 Malignant neoplasm of unspecified part of unspecified bronchus or lung: Secondary | ICD-10-CM | POA: Diagnosis not present

## 2020-08-11 DIAGNOSIS — I700 Atherosclerosis of aorta: Secondary | ICD-10-CM | POA: Diagnosis not present

## 2020-08-11 DIAGNOSIS — J101 Influenza due to other identified influenza virus with other respiratory manifestations: Secondary | ICD-10-CM | POA: Diagnosis not present

## 2020-08-11 DIAGNOSIS — R918 Other nonspecific abnormal finding of lung field: Secondary | ICD-10-CM | POA: Diagnosis not present

## 2020-08-11 DIAGNOSIS — Z9889 Other specified postprocedural states: Secondary | ICD-10-CM | POA: Diagnosis not present

## 2020-08-11 DIAGNOSIS — K8020 Calculus of gallbladder without cholecystitis without obstruction: Secondary | ICD-10-CM | POA: Diagnosis not present

## 2020-08-11 MED ORDER — SODIUM CHLORIDE (PF) 0.9 % IJ SOLN
INTRAMUSCULAR | Status: AC
  Filled 2020-08-11: qty 50

## 2020-08-11 MED ORDER — IOHEXOL 300 MG/ML  SOLN
75.0000 mL | Freq: Once | INTRAMUSCULAR | Status: AC | PRN
  Administered 2020-08-11: 75 mL via INTRAVENOUS

## 2020-08-12 DIAGNOSIS — G43009 Migraine without aura, not intractable, without status migrainosus: Secondary | ICD-10-CM | POA: Diagnosis not present

## 2020-08-12 DIAGNOSIS — E782 Mixed hyperlipidemia: Secondary | ICD-10-CM | POA: Diagnosis not present

## 2020-08-12 DIAGNOSIS — K219 Gastro-esophageal reflux disease without esophagitis: Secondary | ICD-10-CM | POA: Diagnosis not present

## 2020-08-12 DIAGNOSIS — I10 Essential (primary) hypertension: Secondary | ICD-10-CM | POA: Diagnosis not present

## 2020-08-12 DIAGNOSIS — D649 Anemia, unspecified: Secondary | ICD-10-CM | POA: Diagnosis not present

## 2020-08-12 DIAGNOSIS — J439 Emphysema, unspecified: Secondary | ICD-10-CM | POA: Diagnosis not present

## 2020-08-12 DIAGNOSIS — I25110 Atherosclerotic heart disease of native coronary artery with unstable angina pectoris: Secondary | ICD-10-CM | POA: Diagnosis not present

## 2020-08-12 DIAGNOSIS — C3490 Malignant neoplasm of unspecified part of unspecified bronchus or lung: Secondary | ICD-10-CM | POA: Diagnosis not present

## 2020-08-12 DIAGNOSIS — E039 Hypothyroidism, unspecified: Secondary | ICD-10-CM | POA: Diagnosis not present

## 2020-08-13 ENCOUNTER — Ambulatory Visit: Payer: Medicare Other | Admitting: Internal Medicine

## 2020-08-13 ENCOUNTER — Telehealth: Payer: Self-pay | Admitting: Internal Medicine

## 2020-08-13 ENCOUNTER — Other Ambulatory Visit: Payer: Self-pay

## 2020-08-13 ENCOUNTER — Inpatient Hospital Stay (HOSPITAL_BASED_OUTPATIENT_CLINIC_OR_DEPARTMENT_OTHER): Payer: Medicare Other | Admitting: Internal Medicine

## 2020-08-13 VITALS — BP 141/75 | HR 66 | Temp 97.0°F | Resp 20 | Ht 73.0 in | Wt 242.6 lb

## 2020-08-13 DIAGNOSIS — C3431 Malignant neoplasm of lower lobe, right bronchus or lung: Secondary | ICD-10-CM | POA: Diagnosis not present

## 2020-08-13 DIAGNOSIS — Z5111 Encounter for antineoplastic chemotherapy: Secondary | ICD-10-CM

## 2020-08-13 DIAGNOSIS — Z79899 Other long term (current) drug therapy: Secondary | ICD-10-CM | POA: Diagnosis not present

## 2020-08-13 DIAGNOSIS — C3490 Malignant neoplasm of unspecified part of unspecified bronchus or lung: Secondary | ICD-10-CM | POA: Diagnosis not present

## 2020-08-13 DIAGNOSIS — I10 Essential (primary) hypertension: Secondary | ICD-10-CM

## 2020-08-13 NOTE — Telephone Encounter (Signed)
Scheduled follow-up appointment per 5/12 los. Patient is aware.

## 2020-08-13 NOTE — Progress Notes (Signed)
[place] Telephone:(336) [phone]   Fax:(336) [phone]  OFFICE PROGRESS NOTE  [name], MD 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  PRIOR THERAPY: 1) Status post right upper lobectomy under the care of Dr. Roxan Hockey on 01/23/2006.  2) Tarceva 150 mg by mouth daily as part of the BMS checkmate 370 clinical trial. Status post 6 weeks of treatment.  CURRENT THERAPY: Tarceva 100 mg by mouth daily as part of the BMS Checkmate 370 clinical trial. First dose started 07/17/2014 status post 72 months of treatment.  INTERVAL HISTORY: [name] 75 y.o. male returns to the clinic today for follow-up visit.  The patient is feeling fine today with no concerning complaints except for occasional pain on the right side of the chest.  He denied having any shortness of breath except with exertion especially going up stairs but no hemoptysis.  He denied having any fever or chills.  He has no nausea, vomiting, diarrhea or constipation.  He denied having any headache or visual changes.  He has no significant weight loss or night sweats.  He continues to tolerate his treatment with Tarceva fairly well.  The patient had repeat CT scan of the chest, abdomen pelvis performed recently and he is here for evaluation and discussion of his discuss results.   MEDICAL HISTORY: Past Medical History:  Diagnosis Date  . Cholelithiases 07/16/2015  . Complication of anesthesia 2007   quit breathing with bronchoscopy, had to spend night  . History of agent Orange exposure 44-45 yrs ago  . History of pneumonia  last 3-4 yrs ago   3 -4 different times  . Hypercholesterolemia   . Hypertension   . Hypothyroidism   . lung ca dx'd 2007   lung right  . Sinus disease    hx of  . Sleep apnea    uses cpap  setting opf 12    ALLERGIES:  is allergic to lisinopril and prednisone.  MEDICATIONS:  Current Outpatient Medications  Medication Sig Dispense Refill  . amitriptyline (ELAVIL) 50 MG tablet Take 50 mg by mouth at bedtime. Reported on 06/18/2015    . aspirin 81 MG tablet Take 81 mg by mouth every morning.     . Cholecalciferol 2000 units CAPS Take 2,000 Units by mouth daily.     . clindamycin (CLEOCIN T) 1 % external solution APPLY TO AFFECTED AREA(S) TWO TIMES A DAY AS NEEDED FOR SKIN IRRITATION 30 mL 0  . diltiazem (TIAZAC) 180 MG 24 hr capsule TAKE ONE CAPSULE BY MOUTH DAILY 90 capsule 1  . erlotinib (TARCEVA) 100 MG tablet Take 1 tablet (100 mg total) by mouth daily. Take on an empty stomach 1 hour before meals or 2 hours after.faxed to [facility] 90 tablet 3  . levETIRAcetam (KEPPRA) 750 MG tablet Take 750 mg by mouth at bedtime.     . metoprolol succinate (TOPROL-XL) 50 MG 24 hr tablet Take 1 tablet (50 mg total) by mouth daily. Take with or immediately following a meal. 90 tablet 3  . Multiple Vitamins-Minerals (CENTRUM SILVER PO) Take 1 tablet by mouth daily.     . nitroGLYCERIN (NITROSTAT) 0.4 MG SL tablet PLACE ONE TABLET UNDER THE TONGUE EVERY 5 MINUTES AS NEEDED FOR CHEST PAIN 25 tablet 10  . polyethylene glycol (MIRALAX / GLYCOLAX)  packet Take 17 g by mouth daily. Reported on 08/27/2015    . psyllium (METAMUCIL) 58.6 % powder Take 1 packet by mouth daily. Reported on 08/27/2015    . rosuvastatin (CRESTOR) 5 MG tablet Take 5 mg by mouth every morning.     [name] SYNTHROID 112 MCG tablet Take 112 mcg by mouth daily.    . valsartan-hydrochlorothiazide (DIOVAN-HCT) 320-25 MG per tablet Take 1 tablet by mouth every morning.      No current facility-administered medications for this visit.    SURGICAL HISTORY:  Past Surgical History:  Procedure Laterality Date  . BIOPSY  05/21/2018   Procedure: BIOPSY;  Surgeon: Graylin Shiver, MD;  Location: [facility];  Service: Endoscopy;;  .  COLONOSCOPY WITH PROPOFOL N/A 03/19/2013   Procedure: COLONOSCOPY WITH PROPOFOL;  Surgeon: Charolett Bumpers, MD;  Location: [facility];  Service: Endoscopy;  Laterality: N/A;  . Cystourethroscopy, [facility] TURP.  04/16/2010  . ESOPHAGOGASTRODUODENOSCOPY (EGD) WITH PROPOFOL N/A 05/21/2018   Procedure: ESOPHAGOGASTRODUODENOSCOPY (EGD) WITH PROPOFOL;  Surgeon: Graylin Shiver, MD;  Location: [facility];  Service: Endoscopy;  Laterality: N/A;  . NASAL SINUS SURGERY  1987  . Right video-assisted thoracoscopy, right upper lobectomy and  01/23/2006  . The Olympus video bronchoscope was introduced via the right  12/21/2005  . TONSILLECTOMY  1957  . Torn medial and lateral menisci, left knee.  11/06/2006    REVIEW OF SYSTEMS:  Constitutional: negative Eyes: negative Ears, nose, mouth, throat, and face: negative Respiratory: positive for dyspnea on exertion and pleurisy/chest pain Cardiovascular: negative Gastrointestinal: negative Genitourinary:negative Integument/breast: negative Hematologic/lymphatic: negative Musculoskeletal:negative Neurological: negative Behavioral/Psych: negative Endocrine: negative Allergic/Immunologic: negative   PHYSICAL EXAMINATION: General appearance: alert, cooperative and no distress Head: Normocephalic, without obvious abnormality, atraumatic Neck: no adenopathy, no JVD, supple, symmetrical, trachea midline and thyroid not enlarged, symmetric, no tenderness/mass/nodules Lymph nodes: Cervical, supraclavicular, and axillary nodes normal. Resp: clear to auscultation bilaterally Back: symmetric, no curvature. ROM normal. No CVA tenderness. Cardio: regular rate and rhythm, S1, S2 normal, no murmur, click, rub or gallop GI: soft, non-tender; bowel sounds normal; no masses,  no organomegaly Extremities: extremities normal, atraumatic, no cyanosis or edema Neurologic: Alert and oriented X 3, normal strength and tone. Normal symmetric reflexes. Normal coordination and  gait  ECOG PERFORMANCE STATUS: 1 - Symptomatic but completely ambulatory  Blood pressure (!) 141/75, pulse 66, temperature (!) 97 F (36.1 C), temperature source Tympanic, resp. rate 20, height 6\' 1"  (1.854 m), weight 242 lb 9.6 oz (110 kg), SpO2 97 %.  LABORATORY DATA: Lab Results  Component Value Date   WBC 6.0 08/10/2020   HGB 13.7 08/10/2020   HCT 39.6 08/10/2020   MCV 101.8 (H) 08/10/2020   PLT 171 08/10/2020      Chemistry      Component Value Date/Time   NA 141 08/10/2020 0919   NA 141 03/11/2019 0959   NA 139 04/06/2017 0932   K 3.5 08/10/2020 0919   K 3.8 04/06/2017 0932   CL 103 08/10/2020 0919   CL 102 04/24/2012 0853   CO2 28 08/10/2020 0919   CO2 29 04/06/2017 0932   BUN 18 08/10/2020 0919   BUN 18 03/11/2019 0959   BUN 14.4 04/06/2017 0932   CREATININE 1.06 08/10/2020 0919   CREATININE 1.0 04/06/2017 0932      Component Value Date/Time   CALCIUM 9.5 08/10/2020 0919   CALCIUM 9.5 04/06/2017 0932   ALKPHOS 66 08/10/2020 0919   ALKPHOS 58 04/06/2017 0932  AST 31 08/10/2020 0919   AST 27 04/06/2017 0932   ALT 30 08/10/2020 0919   ALT 27 04/06/2017 0932   BILITOT 0.6 08/10/2020 0919   BILITOT 0.45 04/06/2017 0932       RADIOGRAPHIC STUDIES: CT Chest W Contrast  Result Date: 08/11/2020 CLINICAL DATA:  Primary Cancer Type: Lung Imaging Indication: Assess response to therapy Interval therapy since last imaging? Yes Initial Cancer Diagnosis Date: 12/21/2005; established by: Biopsy-proven Detailed Pathology: Recurrent non-small cell lung cancer, adenocarcinoma, initially diagnosed as a stage IA. Primary Tumor location: Right upper lobe. Recurrence? Yes; Date(s) of recurrence: 05/14/2014; Established by: Biopsy-proven Surgeries: Right upper lobectomy 01/23/2006. Chemotherapy: Yes; Ongoing? Yes; Most recent administration: Tarceva daily Immunotherapy? No Radiation therapy? No EXAM: CT CHEST, ABDOMEN, AND PELVIS WITH CONTRAST TECHNIQUE: Multidetector CT imaging  of the chest, abdomen and pelvis was performed following the standard protocol during bolus administration of intravenous contrast. CONTRAST:  39mL OMNIPAQUE IOHEXOL 300 MG/ML SOLN, additional oral enteric contrast COMPARISON:  Most recent CT chest, abdomen and pelvis 02/11/2020. 05/07/2014 PET-CT. FINDINGS: CT CHEST FINDINGS Cardiovascular: Flu in aortic atherosclerosis. Normal heart size. No pericardial effusion. Mediastinum/Nodes: No enlarged mediastinal, hilar, or axillary lymph nodes. Thyroid gland, trachea, and esophagus demonstrate no significant findings. Lungs/Pleura: Redemonstrated postoperative findings of right upper lobectomy. Multiple nodules of the right lower lobe are again seen, the largest nodule in the peripheral anterior right lower lobe measuring 2.3 x 1.3 cm, previously 2.1 x 1.0 cm when measured similarly (series 4, image 59). Additional dominant nodule of the medial right lower lobe is not significantly changed, measuring 2.1 x 1.4 cm (series 4, image 47). No pleural effusion or pneumothorax. Musculoskeletal: No chest wall mass or suspicious bone lesions identified. CT ABDOMEN PELVIS FINDINGS Hepatobiliary: No solid liver abnormality is seen. Rim calcified gallstone in the gallbladder. No gallbladder wall thickening, or biliary dilatation. Pancreas: Unremarkable. No pancreatic ductal dilatation or surrounding inflammatory changes. Spleen: Normal in size without significant abnormality. Adrenals/Urinary Tract: Adrenal glands are unremarkable. Kidneys are normal, without renal calculi, solid lesion, or hydronephrosis. Bladder is unremarkable. Stomach/Bowel: Stomach is within normal limits. Appendix appears normal. No evidence of bowel wall thickening, distention, or inflammatory changes. Sigmoid diverticula. Vascular/Lymphatic: Aortic atherosclerosis. No enlarged abdominal or pelvic lymph nodes. Reproductive: No mass or other abnormality. Other: No abdominal wall hernia or abnormality. No  abdominopelvic ascites. Musculoskeletal: No acute or significant osseous findings. IMPRESSION: 1. Redemonstrated postoperative findings of right upper lobectomy. 2. Multiple nodules of the right lower lobe are again seen, the largest nodule in the peripheral anterior right lower lobe slightly enlarged measuring 2.3 x 1.3 cm, previously 2.1 x 1.0 cm when measured similarly. Findings are concerning for worsened malignancy. 3. Additional nodules of the right lower lobe are unchanged. 4. No evidence of distant metastatic disease within the abdomen or pelvis. 5. Cholelithiasis. Aortic Atherosclerosis (ICD10-I70.0). Electronically Signed   By: Eddie Candle M.D.   On: 08/11/2020 11:44   CT Abdomen Pelvis W Contrast  Result Date: 08/11/2020 CLINICAL DATA:  Primary Cancer Type: Lung Imaging Indication: Assess response to therapy Interval therapy since last imaging? Yes Initial Cancer Diagnosis Date: 12/21/2005; established by: Biopsy-proven Detailed Pathology: Recurrent non-small cell lung cancer, adenocarcinoma, initially diagnosed as a stage IA. Primary Tumor location: Right upper lobe. Recurrence? Yes; Date(s) of recurrence: 05/14/2014; Established by: Biopsy-proven Surgeries: Right upper lobectomy 01/23/2006. Chemotherapy: Yes; Ongoing? Yes; Most recent administration: Tarceva daily Immunotherapy? No Radiation therapy? No EXAM: CT CHEST, ABDOMEN, AND PELVIS WITH CONTRAST TECHNIQUE: Multidetector CT imaging  of the chest, abdomen and pelvis was performed following the standard protocol during bolus administration of intravenous contrast. CONTRAST:  48mL OMNIPAQUE IOHEXOL 300 MG/ML SOLN, additional oral enteric contrast COMPARISON:  Most recent CT chest, abdomen and pelvis 02/11/2020. 05/07/2014 PET-CT. FINDINGS: CT CHEST FINDINGS Cardiovascular: Flu in aortic atherosclerosis. Normal heart size. No pericardial effusion. Mediastinum/Nodes: No enlarged mediastinal, hilar, or axillary lymph nodes. Thyroid gland, trachea,  and esophagus demonstrate no significant findings. Lungs/Pleura: Redemonstrated postoperative findings of right upper lobectomy. Multiple nodules of the right lower lobe are again seen, the largest nodule in the peripheral anterior right lower lobe measuring 2.3 x 1.3 cm, previously 2.1 x 1.0 cm when measured similarly (series 4, image 59). Additional dominant nodule of the medial right lower lobe is not significantly changed, measuring 2.1 x 1.4 cm (series 4, image 47). No pleural effusion or pneumothorax. Musculoskeletal: No chest wall mass or suspicious bone lesions identified. CT ABDOMEN PELVIS FINDINGS Hepatobiliary: No solid liver abnormality is seen. Rim calcified gallstone in the gallbladder. No gallbladder wall thickening, or biliary dilatation. Pancreas: Unremarkable. No pancreatic ductal dilatation or surrounding inflammatory changes. Spleen: Normal in size without significant abnormality. Adrenals/Urinary Tract: Adrenal glands are unremarkable. Kidneys are normal, without renal calculi, solid lesion, or hydronephrosis. Bladder is unremarkable. Stomach/Bowel: Stomach is within normal limits. Appendix appears normal. No evidence of bowel wall thickening, distention, or inflammatory changes. Sigmoid diverticula. Vascular/Lymphatic: Aortic atherosclerosis. No enlarged abdominal or pelvic lymph nodes. Reproductive: No mass or other abnormality. Other: No abdominal wall hernia or abnormality. No abdominopelvic ascites. Musculoskeletal: No acute or significant osseous findings. IMPRESSION: 1. Redemonstrated postoperative findings of right upper lobectomy. 2. Multiple nodules of the right lower lobe are again seen, the largest nodule in the peripheral anterior right lower lobe slightly enlarged measuring 2.3 x 1.3 cm, previously 2.1 x 1.0 cm when measured similarly. Findings are concerning for worsened malignancy. 3. Additional nodules of the right lower lobe are unchanged. 4. No evidence of distant metastatic  disease within the abdomen or pelvis. 5. Cholelithiasis. Aortic Atherosclerosis (ICD10-I70.0). Electronically Signed   By: Eddie Candle M.D.   On: 08/11/2020 11:44    ASSESSMENT AND PLAN:  This is a very pleasant 75 years old white male with recurrent non-small cell lung cancer, adenocarcinoma and positive EGFR mutation in exon 21. He is currently on Tarceva 100 mg by mouth daily status post around 73 months of treatment. The patient has been tolerating this treatment well with no concerning adverse effects. Had repeat CT scan of the chest, abdomen pelvis performed recently.  I personally and independently reviewed the scans and discussed the results with the patient today. His scan showed no concerning findings for disease progression except for slight increase of an anterior right lower lobe lung nodule. I recommended for him to continue his current treatment continue his current treatment with Tarceva with the same dose. I will see him back for follow-up visit in 2 months for evaluation and repeat blood work. I would consider repeating CT scan of the chest, abdomen pelvis in 4 months. The patient was advised to call immediately if he has any other concerning symptoms in the interval. The patient voices understanding of current disease status and treatment options and is in agreement with the current care plan. All questions were answered. The patient knows to call the clinic with any problems, questions or concerns. We can certainly see the patient much sooner if necessary.  Disclaimer: This note was dictated with voice recognition software. Similar sounding  words can inadvertently be transcribed and may not be corrected upon review.

## 2020-08-25 DIAGNOSIS — H52203 Unspecified astigmatism, bilateral: Secondary | ICD-10-CM | POA: Diagnosis not present

## 2020-08-25 DIAGNOSIS — Z961 Presence of intraocular lens: Secondary | ICD-10-CM | POA: Diagnosis not present

## 2020-10-06 DIAGNOSIS — Z20822 Contact with and (suspected) exposure to covid-19: Secondary | ICD-10-CM | POA: Diagnosis not present

## 2020-10-07 DIAGNOSIS — J439 Emphysema, unspecified: Secondary | ICD-10-CM | POA: Diagnosis not present

## 2020-10-07 DIAGNOSIS — I10 Essential (primary) hypertension: Secondary | ICD-10-CM | POA: Diagnosis not present

## 2020-10-07 DIAGNOSIS — C3490 Malignant neoplasm of unspecified part of unspecified bronchus or lung: Secondary | ICD-10-CM | POA: Diagnosis not present

## 2020-10-07 DIAGNOSIS — I208 Other forms of angina pectoris: Secondary | ICD-10-CM | POA: Diagnosis not present

## 2020-10-07 DIAGNOSIS — G43009 Migraine without aura, not intractable, without status migrainosus: Secondary | ICD-10-CM | POA: Diagnosis not present

## 2020-10-07 DIAGNOSIS — D649 Anemia, unspecified: Secondary | ICD-10-CM | POA: Diagnosis not present

## 2020-10-07 DIAGNOSIS — E782 Mixed hyperlipidemia: Secondary | ICD-10-CM | POA: Diagnosis not present

## 2020-10-07 DIAGNOSIS — E785 Hyperlipidemia, unspecified: Secondary | ICD-10-CM | POA: Diagnosis not present

## 2020-10-07 DIAGNOSIS — I25110 Atherosclerotic heart disease of native coronary artery with unstable angina pectoris: Secondary | ICD-10-CM | POA: Diagnosis not present

## 2020-10-07 DIAGNOSIS — N400 Enlarged prostate without lower urinary tract symptoms: Secondary | ICD-10-CM | POA: Diagnosis not present

## 2020-10-07 DIAGNOSIS — E039 Hypothyroidism, unspecified: Secondary | ICD-10-CM | POA: Diagnosis not present

## 2020-10-13 ENCOUNTER — Inpatient Hospital Stay: Payer: Medicare Other | Attending: Internal Medicine | Admitting: Internal Medicine

## 2020-10-13 ENCOUNTER — Encounter: Payer: Self-pay | Admitting: Internal Medicine

## 2020-10-13 ENCOUNTER — Other Ambulatory Visit: Payer: Self-pay

## 2020-10-13 ENCOUNTER — Inpatient Hospital Stay: Payer: Medicare Other

## 2020-10-13 VITALS — BP 130/79 | HR 56 | Temp 98.6°F | Resp 18 | Ht 73.0 in | Wt 242.4 lb

## 2020-10-13 DIAGNOSIS — Z79899 Other long term (current) drug therapy: Secondary | ICD-10-CM | POA: Insufficient documentation

## 2020-10-13 DIAGNOSIS — I10 Essential (primary) hypertension: Secondary | ICD-10-CM | POA: Diagnosis not present

## 2020-10-13 DIAGNOSIS — Z902 Acquired absence of lung [part of]: Secondary | ICD-10-CM | POA: Diagnosis not present

## 2020-10-13 DIAGNOSIS — C3490 Malignant neoplasm of unspecified part of unspecified bronchus or lung: Secondary | ICD-10-CM | POA: Diagnosis not present

## 2020-10-13 DIAGNOSIS — E7800 Pure hypercholesterolemia, unspecified: Secondary | ICD-10-CM | POA: Diagnosis not present

## 2020-10-13 DIAGNOSIS — C3411 Malignant neoplasm of upper lobe, right bronchus or lung: Secondary | ICD-10-CM | POA: Diagnosis not present

## 2020-10-13 DIAGNOSIS — Z7982 Long term (current) use of aspirin: Secondary | ICD-10-CM | POA: Diagnosis not present

## 2020-10-13 DIAGNOSIS — E039 Hypothyroidism, unspecified: Secondary | ICD-10-CM | POA: Insufficient documentation

## 2020-10-13 DIAGNOSIS — C3431 Malignant neoplasm of lower lobe, right bronchus or lung: Secondary | ICD-10-CM

## 2020-10-13 LAB — CBC WITH DIFFERENTIAL (CANCER CENTER ONLY)
Abs Immature Granulocytes: 0.01 10*3/uL (ref 0.00–0.07)
Basophils Absolute: 0 10*3/uL (ref 0.0–0.1)
Basophils Relative: 1 %
Eosinophils Absolute: 0.2 10*3/uL (ref 0.0–0.5)
Eosinophils Relative: 4 %
HCT: 38.5 % — ABNORMAL LOW (ref 39.0–52.0)
Hemoglobin: 13.4 g/dL (ref 13.0–17.0)
Immature Granulocytes: 0 %
Lymphocytes Relative: 46 %
Lymphs Abs: 2.5 10*3/uL (ref 0.7–4.0)
MCH: 34.4 pg — ABNORMAL HIGH (ref 26.0–34.0)
MCHC: 34.8 g/dL (ref 30.0–36.0)
MCV: 99 fL (ref 80.0–100.0)
Monocytes Absolute: 0.7 10*3/uL (ref 0.1–1.0)
Monocytes Relative: 12 %
Neutro Abs: 2 10*3/uL (ref 1.7–7.7)
Neutrophils Relative %: 37 %
Platelet Count: 179 10*3/uL (ref 150–400)
RBC: 3.89 MIL/uL — ABNORMAL LOW (ref 4.22–5.81)
RDW: 12.1 % (ref 11.5–15.5)
WBC Count: 5.4 10*3/uL (ref 4.0–10.5)
nRBC: 0 % (ref 0.0–0.2)

## 2020-10-13 LAB — CMP (CANCER CENTER ONLY)
ALT: 30 U/L (ref 0–44)
AST: 36 U/L (ref 15–41)
Albumin: 3.9 g/dL (ref 3.5–5.0)
Alkaline Phosphatase: 66 U/L (ref 38–126)
Anion gap: 9 (ref 5–15)
BUN: 18 mg/dL (ref 8–23)
CO2: 28 mmol/L (ref 22–32)
Calcium: 9.6 mg/dL (ref 8.9–10.3)
Chloride: 103 mmol/L (ref 98–111)
Creatinine: 0.99 mg/dL (ref 0.61–1.24)
GFR, Estimated: >60 mL/min (ref >60)
Glucose, Bld: 98 mg/dL (ref 70–99)
Potassium: 3.6 mmol/L (ref 3.5–5.1)
Sodium: 140 mmol/L (ref 135–145)
Total Bilirubin: 0.6 mg/dL (ref 0.3–1.2)
Total Protein: 6.6 g/dL (ref 6.5–8.1)

## 2020-10-13 NOTE — Progress Notes (Signed)
[place] Telephone:([phone]) [phone]   [phone]) [phone]  OFFICE PROGRESS NOTE  [name], MD 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  PRIOR THERAPY: 1) Status post right upper lobectomy under the care of Dr. Roxan Hockey on 01/23/2006.  2) Tarceva 150 mg by mouth daily as part of the BMS checkmate 370 clinical trial. Status post 6 weeks of treatment.  CURRENT THERAPY: Tarceva 100 mg by mouth daily as part of the BMS Checkmate 370 clinical trial. First dose started 07/17/2014 status post 75 months of treatment.  INTERVAL HISTORY: [name] 75 y.o. male returns to the clinic today for follow-up visit.  The patient is feeling fine today with no concerning complaints.  He celebrated his 75th birthday yesterday.  He denied having any current chest pain, shortness of breath, cough or hemoptysis.  He denied having any fever or chills.  He has no nausea, vomiting, diarrhea or constipation.  He has no headache or visual changes.  He denied having any weight loss or night sweats.  He continues to tolerate his treatment with Tarceva fairly well.  The patient is here today for evaluation and repeat blood work.  MEDICAL HISTORY: Past Medical History:  Diagnosis Date   Cholelithiases 04/05/5850   Complication of anesthesia 2007   quit breathing with bronchoscopy, had to spend night   History of agent Orange exposure 44-45 yrs ago   History of pneumonia  last 3-4 yrs ago   3 -4 different times   Hypercholesterolemia    Hypertension    Hypothyroidism    lung ca dx'd 2007   lung right   Sinus disease    hx of   Sleep apnea    uses cpap setting opf 12    ALLERGIES:  is allergic to lisinopril and prednisone.  MEDICATIONS:  Current Outpatient Medications  Medication Sig  Dispense Refill   amitriptyline (ELAVIL) 50 MG tablet Take 50 mg by mouth at bedtime. Reported on 06/18/2015     aspirin 81 MG tablet Take 81 mg by mouth every morning.      Cholecalciferol 2000 units CAPS Take 2,000 Units by mouth daily.      clindamycin (CLEOCIN T) 1 % external solution APPLY TO AFFECTED AREA(S) TWO TIMES A DAY AS NEEDED FOR SKIN IRRITATION 30 mL 0   diltiazem (TIAZAC) 180 MG 24 hr capsule TAKE ONE CAPSULE BY MOUTH DAILY 90 capsule 1   erlotinib (TARCEVA) 100 MG tablet Take 1 tablet (100 mg total) by mouth daily. Take on an empty stomach 1 hour before meals or 2 hours after.faxed to [facility] 90 tablet 3   levETIRAcetam (KEPPRA) 750 MG tablet Take 750 mg by mouth at bedtime.      metoprolol succinate (TOPROL-XL) 50 MG 24 hr tablet Take 1 tablet (50 mg total) by mouth daily. Take with or immediately following a meal. 90 tablet 3   Multiple Vitamins-Minerals (CENTRUM SILVER PO) Take 1 tablet by mouth daily.      nitroGLYCERIN (NITROSTAT) 0.4 MG SL tablet PLACE ONE TABLET UNDER THE TONGUE EVERY 5 MINUTES AS NEEDED FOR CHEST PAIN 25 tablet 10   polyethylene glycol (MIRALAX / GLYCOLAX) packet Take 17 g by mouth daily. Reported on 08/27/2015     psyllium (METAMUCIL) 58.6 % powder Take 1 packet by mouth  daily. Reported on 08/27/2015     rosuvastatin (CRESTOR) 5 MG tablet Take 5 mg by mouth every morning.      SYNTHROID 112 MCG tablet Take 112 mcg by mouth daily.     valsartan-hydrochlorothiazide (DIOVAN-HCT) 320-25 MG per tablet Take 1 tablet by mouth every morning.      No current facility-administered medications for this visit.    SURGICAL HISTORY:  Past Surgical History:  Procedure Laterality Date   BIOPSY  05/21/2018   Procedure: BIOPSY;  Surgeon: Wonda Horner, MD;  Location: [facility];  Service: Endoscopy;;   COLONOSCOPY WITH PROPOFOL N/A 03/19/2013   Procedure: COLONOSCOPY WITH PROPOFOL;  Surgeon: Garlan Fair, MD;  Location: [facility];  Service: Endoscopy;   Laterality: N/A;   Cystourethroscopy, [facility] TURP.  04/16/2010   ESOPHAGOGASTRODUODENOSCOPY (EGD) WITH PROPOFOL N/A 05/21/2018   Procedure: ESOPHAGOGASTRODUODENOSCOPY (EGD) WITH PROPOFOL;  Surgeon: Wonda Horner, MD;  Location: [facility];  Service: Endoscopy;  Laterality: N/A;   NASAL SINUS SURGERY  1987   Right video-assisted thoracoscopy, right upper lobectomy and  01/23/2006   The Olympus video bronchoscope was introduced via the right  12/21/2005   TONSILLECTOMY  1957   Torn medial and lateral menisci, left knee.  11/06/2006    REVIEW OF SYSTEMS:  A comprehensive review of systems was negative.   PHYSICAL EXAMINATION: General appearance: alert, cooperative, and no distress Head: Normocephalic, without obvious abnormality, atraumatic Neck: no adenopathy, no JVD, supple, symmetrical, trachea midline, and thyroid not enlarged, symmetric, no tenderness/mass/nodules Lymph nodes: Cervical, supraclavicular, and axillary nodes normal. Resp: clear to auscultation bilaterally Back: symmetric, no curvature. ROM normal. No CVA tenderness. Cardio: regular rate and rhythm, S1, S2 normal, no murmur, click, rub or gallop GI: soft, non-tender; bowel sounds normal; no masses,  no organomegaly Extremities: extremities normal, atraumatic, no cyanosis or edema  ECOG PERFORMANCE STATUS: 1 - Symptomatic but completely ambulatory  Blood pressure 130/79, pulse (!) 56, temperature 98.6 F (37 C), temperature source Oral, resp. rate 18, height _0  (1.854 m), weight 242 lb 6.4 oz (110 kg), SpO2 98 %.  LABORATORY DATA: Lab Results  Component Value Date   WBC 5.4 10/13/2020   HGB 13.4 10/13/2020   HCT 38.5 (L) 10/13/2020   MCV 99.0 10/13/2020   PLT 179 10/13/2020      Chemistry      Component Value Date/Time   NA 141 08/10/2020 0919   NA 141 03/11/2019 0959   NA 139 04/06/2017 0932   K 3.5 08/10/2020 0919   K 3.8 04/06/2017 0932   CL 103 08/10/2020 0919   CL 102 04/24/2012 0853   CO2 28  08/10/2020 0919   CO2 29 04/06/2017 0932   BUN 18 08/10/2020 0919   BUN 18 03/11/2019 0959   BUN 14.4 04/06/2017 0932   CREATININE 1.06 08/10/2020 0919   CREATININE 1.0 04/06/2017 0932      Component Value Date/Time   CALCIUM 9.5 08/10/2020 0919   CALCIUM 9.5 04/06/2017 0932   ALKPHOS 66 08/10/2020 0919   ALKPHOS 58 04/06/2017 0932   AST 31 08/10/2020 0919   AST 27 04/06/2017 0932   ALT 30 08/10/2020 0919   ALT 27 04/06/2017 0932   BILITOT 0.6 08/10/2020 0919   BILITOT 0.45 04/06/2017 0932       RADIOGRAPHIC STUDIES: No results found.   ASSESSMENT AND PLAN:  This is a very pleasant 75 years old white male with recurrent non-small cell lung cancer, adenocarcinoma and positive EGFR mutation in  exon 21. He is currently on Tarceva 100 mg by mouth daily status post around 75 months of treatment. The patient continues to tolerate his treatment well with no concerning adverse effects. CBC today is unremarkable.  Comprehensive metabolic panel is still pending. I recommended for the patient to continue his current treatment with Tarceva with the same dose. I will see him back for follow-up visit in 2 months for evaluation with repeat CT scan of the chest, abdomen pelvis for restaging of his disease. The patient was advised to call immediately if he has any concerning symptoms in the interval. The patient voices understanding of current disease status and treatment options and is in agreement with the current care plan. All questions were answered. The patient knows to call the clinic with any problems, questions or concerns. We can certainly see the patient much sooner if necessary.  Disclaimer: This note was dictated with voice recognition software. Similar sounding words can inadvertently be transcribed and may not be corrected upon review.

## 2020-10-20 ENCOUNTER — Ambulatory Visit: Payer: Medicare Other | Admitting: Thoracic Surgery (Cardiothoracic Vascular Surgery)

## 2020-10-22 NOTE — Progress Notes (Signed)
Cardiology Office Note:    Date:  10/23/2020   ID:  [name], DOB 03-30-1946, MRN [id number]  PCP:  [name], MD  Cardiologist:  Sinclair Grooms, MD   Referring MD: [name], MD   Chief Complaint  Patient presents with   Coronary Artery Disease   Hypertension     History of Present Illness:    [name] is a 75 y.o. male with a hx of non-small cell lung cancer (adenocarcinoma) 2007, right upper lobectomy 2007, Sleep apnea, essential hypertension, hyperlipidemia, now being evaluated for exertional chest pain.   He has concerns that blood pressures have been creeping upward.  At some doctors office appointments he has systolic pressures greater than 150.  Some recorded pressures at home more also above 160 mmHg.  He denies chest pain, orthopnea, PND.  He is not having syncope.  The combination of diltiazem and Toprol-XL is concerning because of significant first-degree AV block.  Beta-blocker was added because of dilated aorta.  Past Medical History:  Diagnosis Date   Cholelithiases 1/60/7371   Complication of anesthesia 2007   quit breathing with bronchoscopy, had to spend night   History of agent Orange exposure 44-45 yrs ago   History of pneumonia  last 3-4 yrs ago   3 -4 different times   Hypercholesterolemia    Hypertension    Hypothyroidism    lung ca dx'd 2007   lung right   Sinus disease    hx of   Sleep apnea    uses cpap setting opf 12    Past Surgical History:  Procedure Laterality Date   BIOPSY  05/21/2018   Procedure: BIOPSY;  Surgeon: Wonda Horner, MD;  Location: [facility];  Service: Endoscopy;;   COLONOSCOPY WITH PROPOFOL N/A 03/19/2013   Procedure: COLONOSCOPY WITH PROPOFOL;  Surgeon: Garlan Fair, MD;  Location: [facility];  Service: Endoscopy;  Laterality: N/A;   Cystourethroscopy, [facility] TURP.  04/16/2010   ESOPHAGOGASTRODUODENOSCOPY (EGD) WITH PROPOFOL N/A 05/21/2018   Procedure: ESOPHAGOGASTRODUODENOSCOPY (EGD)  WITH PROPOFOL;  Surgeon: Wonda Horner, MD;  Location: [facility];  Service: Endoscopy;  Laterality: N/A;   NASAL SINUS SURGERY  1987   Right video-assisted thoracoscopy, right upper lobectomy and  01/23/2006   The Olympus video bronchoscope was introduced via the right  12/21/2005   TONSILLECTOMY  1957   Torn medial and lateral menisci, left knee.  11/06/2006    Current Medications: Current Meds  Medication Sig   amitriptyline (ELAVIL) 50 MG tablet Take 50 mg by mouth at bedtime. Reported on 06/18/2015   aspirin 81 MG tablet Take 81 mg by mouth every morning.    Cholecalciferol 2000 units CAPS Take 2,000 Units by mouth daily.    clindamycin (CLEOCIN T) 1 % external solution APPLY TO AFFECTED AREA(S) TWO TIMES A DAY AS NEEDED FOR SKIN IRRITATION   diltiazem (TIAZAC) 180 MG 24 hr capsule TAKE ONE CAPSULE BY MOUTH DAILY   erlotinib (TARCEVA) 100 MG tablet Take 1 tablet (100 mg total) by mouth daily. Take on an empty stomach 1 hour before meals or 2 hours after.faxed to [facility]   levETIRAcetam (KEPPRA) 750 MG tablet Take 750 mg by mouth at bedtime.    metoprolol succinate (TOPROL-XL) 50 MG 24 hr tablet Take 1 tablet (50 mg total) by mouth daily. Take with or immediately following a meal.   Multiple Vitamins-Minerals (CENTRUM SILVER PO) Take 1 tablet by mouth daily.    nitroGLYCERIN (NITROSTAT) 0.4 MG  SL tablet PLACE ONE TABLET UNDER THE TONGUE EVERY 5 MINUTES AS NEEDED FOR CHEST PAIN   polyethylene glycol (MIRALAX / GLYCOLAX) packet Take 17 g by mouth daily. Reported on 08/27/2015   psyllium (METAMUCIL) 58.6 % powder Take 1 packet by mouth daily. Reported on 08/27/2015   rosuvastatin (CRESTOR) 5 MG tablet Take 5 mg by mouth every morning.    SYNTHROID 112 MCG tablet Take 112 mcg by mouth daily.   valsartan-hydrochlorothiazide (DIOVAN-HCT) 320-25 MG per tablet Take 1 tablet by mouth every morning.      Allergies:   Lisinopril and Prednisone   Social History   Socioeconomic History    Marital status: Married    Spouse name: Not on file   Number of children: 0   Years of education: Not on file   Highest education level: Not on file  Occupational History   Occupation: Administrator    Comment: Retired  Tobacco Use   Smoking status: Never   Smokeless tobacco: Never  Vaping Use   Vaping Use: Never used  Substance and Sexual Activity   Alcohol use: No    Alcohol/week: 0.0 standard drinks   Drug use: No   Sexual activity: Not Currently  Other Topics Concern   Not on file  Social History Narrative   Not on file   Social Determinants of Health   Financial Resource Strain: Not on file  Food Insecurity: Not on file  Transportation Needs: Not on file  Physical Activity: Not on file  Stress: Not on file  Social Connections: Not on file     Family History: The patient's family history includes Heart attack in his brother and father; Hypertension in his mother; Varicose Veins in his mother and sister.  ROS:   Please see the history of present illness.    Sleeping okay.  No cardiac complaints other than concerned about the blood pressure.  All other systems reviewed and are negative.  EKGs/Labs/Other Studies Reviewed:    The following studies were reviewed today: Coronary CTA May 14, 2019: IMPRESSION: 1. Coronary calcium score of 0. This was 0 percentile for age and sex matched control.   2. Normal coronary origin with right dominance.   3. No evidence of CAD; CADRADS-0.   4. 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   Kirk Ruths  EKG:  EKG sinus rhythm with nonspecific ST abnormality.  First-degree AV block with PR 244 ms.  Recent Labs: 10/13/2020: ALT 30; BUN 18; Creatinine 0.99; Hemoglobin 13.4; Platelet Count 179; Potassium 3.6; Sodium 140  Recent Lipid Panel No results found for: CHOL, TRIG, HDL, CHOLHDL, VLDL, LDLCALC, LDLDIRECT  Physical Exam:    VS:  BP 138/84   Pulse 60   Ht 6\' 1"  (1.854 m)   Wt 244 lb 9.6 oz (110.9 kg)   SpO2  97%   BMI 32.27 kg/m     Wt Readings from Last 3 Encounters:  10/23/20 244 lb 9.6 oz (110.9 kg)  10/13/20 242 lb 6.4 oz (110 kg)  08/13/20 242 lb 9.6 oz (110 kg)     GEN: Overweight. No acute distress HEENT: Normal NECK: No JVD. LYMPHATICS: No lymphadenopathy CARDIAC: No murmur. RRR, with S4 gallop, and 2+ ankle edema. VASCULAR:  Normal Pulses. No bruits. RESPIRATORY:  Clear to auscultation without rales, wheezing or rhonchi  ABDOMEN: Soft, non-tender, non-distended, No pulsatile mass, MUSCULOSKELETAL: No deformity  SKIN: Warm and dry NEUROLOGIC:  Alert and oriented x 3 PSYCHIATRIC:  Normal affect   ASSESSMENT:  1. Angina pectoris ([place])   2. Essential hypertension   3. Hyperlipidemia LDL goal <70   4. First degree AV block   5. Sleep apnea, unspecified type   6. Malignant neoplasm of lower lobe of right lung (HCC)    PLAN:    In order of problems listed above:  Resolved and does not have obstructive disease.  If he has recurring chest pain that sounds anginal, it would have to be based on microvascular disease/dysfunction. 160/90 mmHg in both arms by my personal assessment and recording.  Discontinue diltiazem.  Start amlodipine 5 mg/day.  Increase Toprol-XL to 50 mg a.m. and 25 mg p.m.  Return in 1 month for blood pressure follow-up.  If heart rate does not get too slow and further upward adjustment in medication intensity is needed on return, consider increasing Toprol-XL to 100 mg/day.  An additional add on could also be low-dose spironolactone. Continue Crestor 5 mg daily.  LDL was less than 70 when last checked within the past 12 months. Diltiazem and beta-blocker combination is being discontinued.  Still need to watch PR interval on a higher dose of beta-blocker therapy.  Current PR is 244 ms. Compliant with CPAP.   Overall education and awareness concerning primary/secondary risk prevention was discussed in detail: LDL less than 70, hemoglobin A1c less than 7,  blood pressure target less than 130/80 mmHg, >150 minutes of moderate aerobic activity per week, avoidance of smoking, weight control (via diet and exercise), and continued surveillance/management of/for obstructive sleep apnea.    Medication Adjustments/Labs and Tests Ordered: Current medicines are reviewed at length with the patient today.  Concerns regarding medicines are outlined above.  Orders Placed This Encounter  Procedures   EKG 12-Lead   No orders of the defined types were placed in this encounter.   There are no Patient Instructions on file for this visit.   Signed, Sinclair Grooms, MD  10/23/2020 2:06 PM    [facility]

## 2020-10-23 ENCOUNTER — Ambulatory Visit (INDEPENDENT_AMBULATORY_CARE_PROVIDER_SITE_OTHER): Payer: Medicare Other | Admitting: Interventional Cardiology

## 2020-10-23 ENCOUNTER — Other Ambulatory Visit: Payer: Self-pay

## 2020-10-23 ENCOUNTER — Encounter: Payer: Self-pay | Admitting: Interventional Cardiology

## 2020-10-23 VITALS — BP 138/84 | HR 60 | Ht 73.0 in | Wt 244.6 lb

## 2020-10-23 DIAGNOSIS — I440 Atrioventricular block, first degree: Secondary | ICD-10-CM

## 2020-10-23 DIAGNOSIS — E785 Hyperlipidemia, unspecified: Secondary | ICD-10-CM

## 2020-10-23 DIAGNOSIS — C3431 Malignant neoplasm of lower lobe, right bronchus or lung: Secondary | ICD-10-CM | POA: Diagnosis not present

## 2020-10-23 DIAGNOSIS — G4730 Sleep apnea, unspecified: Secondary | ICD-10-CM

## 2020-10-23 DIAGNOSIS — I209 Angina pectoris, unspecified: Secondary | ICD-10-CM | POA: Diagnosis not present

## 2020-10-23 DIAGNOSIS — I10 Essential (primary) hypertension: Secondary | ICD-10-CM | POA: Diagnosis not present

## 2020-10-23 MED ORDER — AMLODIPINE BESYLATE 5 MG PO TABS: 5.0000 mg | ORAL_TABLET | Freq: Every day | ORAL | 3 refills | Status: DC

## 2020-10-23 MED ORDER — METOPROLOL SUCCINATE ER 50 MG PO TB24: ORAL_TABLET | ORAL | 3 refills | Status: DC

## 2020-10-23 NOTE — Patient Instructions (Signed)
Medication Instructions:  1) DISCONTINUE Diltiazem 2) INCREASE Metoprolol Succinate to 50mg  in the morning and 25mg  in the evening. 3) START Amlodipine 5mg  once daily  *If you need a refill on your cardiac medications before your next appointment, please call your pharmacy*   Lab Work: None If you have labs (blood work) drawn today and your tests are completely normal, you will receive your results only by: [place] (if you have MyChart) OR A paper copy in the mail If you have any lab test that is abnormal or we need to change your treatment, we will call you to review the results.   Testing/Procedures: None   Follow-Up:  Your physician recommends that you schedule a follow-up appointment in: 1 month with the Hypertension Clinic  At [facility], you and your health needs are our priority.  As part of our continuing mission to provide you with exceptional heart care, we have created designated Provider Care Teams.  These Care Teams include your primary Cardiologist (physician) and Advanced Practice Providers (APPs -  Physician Assistants and Nurse Practitioners) who all work together to provide you with the care you need, when you need it.  We recommend signing up for the patient portal called "MyChart".  Sign up information is provided on this After Visit Summary.  MyChart is used to connect with patients for Virtual Visits (Telemedicine).  Patients are able to view lab/test results, encounter notes, upcoming appointments, etc.  Non-urgent messages can be sent to your provider as well.   To learn more about what you can do with MyChart, go to [email or web address].    Your next appointment:   1 year(s)  The format for your next appointment:   In Person  Provider:   You may see Sinclair Grooms, MD or one of the following Advanced Practice Providers on your designated Care Team:   Cecilie Kicks, NP   Other Instructions

## 2020-11-23 NOTE — Progress Notes (Signed)
Patient ID: VERBON GIANGREGORIO                 DOB: Aug 14, 1945                      MRN: [id number]    HPI: [name] is a 75 y.o. male referred by Dr. Tamala Julian to HTN clinic. PMH is significant for angina, HTN, HLD, first degree AV block, OSA, hypothyroidism, non-small cell lung cancer (adenocarcinoma) 2007, right upper lobectomy 2007. Coronary CTA 05/2019 showed coronary calcium score of 0 with no evidence of CAD, mildly dilated ascending aorta for which beta blocker was started. Aortic atherosclerosis seen on 08/2020 CT. Last seen by Dr. Tamala Julian 10/23/20, BP 160/90 in both arms. Patient concerned that BP was increasing. Diltiazem was discontinued, Toprol XL increased to 50 mg AM and 25 mg PM, amlodipine 5 mg daily started.   Today, patient arrives in good spirits, reports that his home BP has been doing much better with the medication changes made at last visit. Tolerating increased dose of metoprolol and new amlodipine well with no adverse effects or missed doses reported. Reports he has previously had both his wrist and bicep cuffs validated with our cuff in the office and they read accurately. Confirmed correct BP technique with patient for both cuffs. He generally uses the wrist cuff because he does not need his wife's assistance like with the bicep cuff. Brings log of BP readings today, average home BP 127/73. Denies lightheadedness, headaches, blurred vision, swelling. The only time he gets dizzy is if he goes up the stairs too quickly. Denies dizziness with standing or when getting out of bed. He requests that his metoprolol prescription be sent in as 25 mg tablets so that he does not have to split them anymore for the evening dose. He and his wife walk 6 times per week for 45 minutes each time and he does not cook with or add salt to foods.   Current HTN meds:  Amlodipine 5 mg daily (9-10am) Valsartan-HCTZ 320-25 mg daily (9-10am) Toprol XL 50 mg qAM (9-10am) and 25 mg qPM (11pm-12am)  Previously  tried: lisinopril (cough)  BP goal: <130/80 mmHg  Family History: Heart attack in his brother and father; Hypertension in his mother; Varicose Veins in his mother and sister  Social History: Never smoker, no alcohol use.   Diet: Does not cook with or add salt. Eats out twice per week. Drinks 2 cups of coffee and 1 in the afternoon.   Exercise: walks 6 days per week x 45 minutes (~2.5 miles)  Home BP readings: range from 107/60 to 154/82 (only 2 readings with systolics in 563O and 1 reading in 150s); average of 26 readings is 127/73. Heart rate ranges from 51 to 64 with average 58.   Labs:  -10/13/20: Scr 0.99, K 3.6  Wt Readings from Last 3 Encounters:  10/23/20 244 lb 9.6 oz (110.9 kg)  10/13/20 242 lb 6.4 oz (110 kg)  08/13/20 242 lb 9.6 oz (110 kg)   BP Readings from Last 3 Encounters:  11/24/20 136/80  10/23/20 138/84  10/13/20 130/79   Pulse Readings from Last 3 Encounters:  11/24/20 (!) 53  10/23/20 60  10/13/20 (!) 56    Renal function: CrCl cannot be calculated (Patient's most recent lab result is older than the maximum 21 days allowed.).  Past Medical History:  Diagnosis Date   Cholelithiases 7/56/4332   Complication of anesthesia 2007   quit  breathing with bronchoscopy, had to spend night   History of agent Orange exposure 44-45 yrs ago   History of pneumonia  last 3-4 yrs ago   3 -4 different times   Hypercholesterolemia    Hypertension    Hypothyroidism    lung ca dx'd 2007   lung right   Sinus disease    hx of   Sleep apnea    uses cpap setting opf 12    Current Outpatient Medications on File Prior to Visit  Medication Sig Dispense Refill   amitriptyline (ELAVIL) 50 MG tablet Take 50 mg by mouth at bedtime. Reported on 06/18/2015     amLODipine (NORVASC) 5 MG tablet Take 1 tablet (5 mg total) by mouth daily. 90 tablet 3   aspirin 81 MG tablet Take 81 mg by mouth every morning.      Cholecalciferol 2000 units CAPS Take 2,000 Units by mouth daily.       clindamycin (CLEOCIN T) 1 % external solution APPLY TO AFFECTED AREA(S) TWO TIMES A DAY AS NEEDED FOR SKIN IRRITATION 30 mL 0   erlotinib (TARCEVA) 100 MG tablet Take 1 tablet (100 mg total) by mouth daily. Take on an empty stomach 1 hour before meals or 2 hours after.faxed to [facility] 90 tablet 3   levETIRAcetam (KEPPRA) 750 MG tablet Take 750 mg by mouth at bedtime.      Multiple Vitamins-Minerals (CENTRUM SILVER PO) Take 1 tablet by mouth daily.      nitroGLYCERIN (NITROSTAT) 0.4 MG SL tablet PLACE ONE TABLET UNDER THE TONGUE EVERY 5 MINUTES AS NEEDED FOR CHEST PAIN 25 tablet 10   polyethylene glycol (MIRALAX / GLYCOLAX) packet Take 17 g by mouth daily. Reported on 08/27/2015     psyllium (METAMUCIL) 58.6 % powder Take 1 packet by mouth daily. Reported on 08/27/2015     rosuvastatin (CRESTOR) 5 MG tablet Take 5 mg by mouth every morning.      SYNTHROID 112 MCG tablet Take 112 mcg by mouth daily.     valsartan-hydrochlorothiazide (DIOVAN-HCT) 320-25 MG per tablet Take 1 tablet by mouth every morning.      No current facility-administered medications on file prior to visit.    Allergies  Allergen Reactions   Lisinopril Cough   Prednisone Rash     Assessment/Plan:  1. Hypertension - BP in office today of 136/80 near goal <130/80 mmHg, with average home BP at goal and much improved since last visit. Given HR in 50s, will not increase metoprolol further. Since home BP is at goal and is close to goal in office today, will have him move amlodipine 5 mg dosing to the evening rather than increase the dose. Continue metoprolol succinate 50 mg in the morning and 25 mg in the evening (have sent in new Rx for 25 mg tablets per patient's request) and continue valsartan-HCTZ 320-25 mg daily in the morning. Counseled to continue exercise and avoiding sodium intake. Continue checking BP daily and keeping a log. Will follow up with him by phone in 4 weeks to check in on home BP readings after switching  amlodipine to the evening. Can increase dose of amlodipine if additional BP lowering is needed at that time.  Rebbeca Paul, PharmD PGY2 Ambulatory Care Pharmacy Resident 11/24/2020 2:05 PM

## 2020-11-24 ENCOUNTER — Other Ambulatory Visit: Payer: Self-pay

## 2020-11-24 ENCOUNTER — Ambulatory Visit (INDEPENDENT_AMBULATORY_CARE_PROVIDER_SITE_OTHER): Payer: Medicare Other | Admitting: Student-PharmD

## 2020-11-24 VITALS — BP 136/80 | HR 53

## 2020-11-24 DIAGNOSIS — I209 Angina pectoris, unspecified: Secondary | ICD-10-CM | POA: Diagnosis not present

## 2020-11-24 DIAGNOSIS — I10 Essential (primary) hypertension: Secondary | ICD-10-CM

## 2020-11-24 MED ORDER — METOPROLOL SUCCINATE ER 25 MG PO TB24: ORAL_TABLET | ORAL | 3 refills | Status: DC

## 2020-11-24 NOTE — Patient Instructions (Signed)
It was nice to see you today!  Your goal blood pressure is less than 130/80 mmHg. In clinic, your blood pressure was 136/80 mmHg.  Medication Changes: Begin taking amlodipine 5 mg daily in the evening  Continue metoprolol 50 mg in the evening and 25 mg in the evening  Continue valsartan-HCTZ once daily in the morning  Monitor blood pressure at home daily and keep a log (on your phone or piece of paper) to bring with you to your next visit. Write down date, time, blood pressure and pulse.  Keep up the good work with diet and exercise. Aim for a diet full of vegetables, fruit and lean meats (chicken, [place], fish). Try to limit salt intake by eating fresh or frozen vegetables (instead of canned), rinse canned vegetables prior to cooking and do not add any additional salt to meals.

## 2020-12-01 DIAGNOSIS — Z125 Encounter for screening for malignant neoplasm of prostate: Secondary | ICD-10-CM | POA: Diagnosis not present

## 2020-12-01 DIAGNOSIS — E559 Vitamin D deficiency, unspecified: Secondary | ICD-10-CM | POA: Diagnosis not present

## 2020-12-01 DIAGNOSIS — I10 Essential (primary) hypertension: Secondary | ICD-10-CM | POA: Diagnosis not present

## 2020-12-01 DIAGNOSIS — Z0000 Encounter for general adult medical examination without abnormal findings: Secondary | ICD-10-CM | POA: Diagnosis not present

## 2020-12-01 DIAGNOSIS — E782 Mixed hyperlipidemia: Secondary | ICD-10-CM | POA: Diagnosis not present

## 2020-12-02 DIAGNOSIS — E039 Hypothyroidism, unspecified: Secondary | ICD-10-CM | POA: Diagnosis not present

## 2020-12-02 DIAGNOSIS — E782 Mixed hyperlipidemia: Secondary | ICD-10-CM | POA: Diagnosis not present

## 2020-12-02 DIAGNOSIS — K5900 Constipation, unspecified: Secondary | ICD-10-CM | POA: Diagnosis not present

## 2020-12-02 DIAGNOSIS — I779 Disorder of arteries and arterioles, unspecified: Secondary | ICD-10-CM | POA: Diagnosis not present

## 2020-12-02 DIAGNOSIS — Z0001 Encounter for general adult medical examination with abnormal findings: Secondary | ICD-10-CM | POA: Diagnosis not present

## 2020-12-02 DIAGNOSIS — D649 Anemia, unspecified: Secondary | ICD-10-CM | POA: Diagnosis not present

## 2020-12-02 DIAGNOSIS — E559 Vitamin D deficiency, unspecified: Secondary | ICD-10-CM | POA: Diagnosis not present

## 2020-12-02 DIAGNOSIS — I25110 Atherosclerotic heart disease of native coronary artery with unstable angina pectoris: Secondary | ICD-10-CM | POA: Diagnosis not present

## 2020-12-02 DIAGNOSIS — C3490 Malignant neoplasm of unspecified part of unspecified bronchus or lung: Secondary | ICD-10-CM | POA: Diagnosis not present

## 2020-12-02 DIAGNOSIS — G43009 Migraine without aura, not intractable, without status migrainosus: Secondary | ICD-10-CM | POA: Diagnosis not present

## 2020-12-02 DIAGNOSIS — Z1211 Encounter for screening for malignant neoplasm of colon: Secondary | ICD-10-CM | POA: Diagnosis not present

## 2020-12-02 DIAGNOSIS — I10 Essential (primary) hypertension: Secondary | ICD-10-CM | POA: Diagnosis not present

## 2020-12-02 DIAGNOSIS — I208 Other forms of angina pectoris: Secondary | ICD-10-CM | POA: Diagnosis not present

## 2020-12-11 DIAGNOSIS — E039 Hypothyroidism, unspecified: Secondary | ICD-10-CM | POA: Diagnosis not present

## 2020-12-11 DIAGNOSIS — J439 Emphysema, unspecified: Secondary | ICD-10-CM | POA: Diagnosis not present

## 2020-12-11 DIAGNOSIS — G43009 Migraine without aura, not intractable, without status migrainosus: Secondary | ICD-10-CM | POA: Diagnosis not present

## 2020-12-11 DIAGNOSIS — K219 Gastro-esophageal reflux disease without esophagitis: Secondary | ICD-10-CM | POA: Diagnosis not present

## 2020-12-11 DIAGNOSIS — E782 Mixed hyperlipidemia: Secondary | ICD-10-CM | POA: Diagnosis not present

## 2020-12-11 DIAGNOSIS — I10 Essential (primary) hypertension: Secondary | ICD-10-CM | POA: Diagnosis not present

## 2020-12-11 DIAGNOSIS — D649 Anemia, unspecified: Secondary | ICD-10-CM | POA: Diagnosis not present

## 2020-12-11 DIAGNOSIS — I25110 Atherosclerotic heart disease of native coronary artery with unstable angina pectoris: Secondary | ICD-10-CM | POA: Diagnosis not present

## 2020-12-14 ENCOUNTER — Other Ambulatory Visit: Payer: Self-pay

## 2020-12-14 ENCOUNTER — Inpatient Hospital Stay: Payer: Medicare Other | Attending: Internal Medicine

## 2020-12-14 DIAGNOSIS — C3490 Malignant neoplasm of unspecified part of unspecified bronchus or lung: Secondary | ICD-10-CM

## 2020-12-14 DIAGNOSIS — C3411 Malignant neoplasm of upper lobe, right bronchus or lung: Secondary | ICD-10-CM | POA: Diagnosis not present

## 2020-12-14 DIAGNOSIS — E039 Hypothyroidism, unspecified: Secondary | ICD-10-CM | POA: Insufficient documentation

## 2020-12-14 DIAGNOSIS — E7800 Pure hypercholesterolemia, unspecified: Secondary | ICD-10-CM | POA: Insufficient documentation

## 2020-12-14 DIAGNOSIS — I10 Essential (primary) hypertension: Secondary | ICD-10-CM | POA: Insufficient documentation

## 2020-12-14 DIAGNOSIS — Z9079 Acquired absence of other genital organ(s): Secondary | ICD-10-CM | POA: Insufficient documentation

## 2020-12-14 DIAGNOSIS — Z7982 Long term (current) use of aspirin: Secondary | ICD-10-CM | POA: Diagnosis not present

## 2020-12-14 DIAGNOSIS — Z79899 Other long term (current) drug therapy: Secondary | ICD-10-CM | POA: Diagnosis not present

## 2020-12-14 DIAGNOSIS — Z902 Acquired absence of lung [part of]: Secondary | ICD-10-CM | POA: Diagnosis not present

## 2020-12-14 LAB — CBC WITH DIFFERENTIAL (CANCER CENTER ONLY)
Abs Immature Granulocytes: 0.02 10*3/uL (ref 0.00–0.07)
Basophils Absolute: 0 10*3/uL (ref 0.0–0.1)
Basophils Relative: 1 %
Eosinophils Absolute: 0.3 10*3/uL (ref 0.0–0.5)
Eosinophils Relative: 4 %
HCT: 39.5 % (ref 39.0–52.0)
Hemoglobin: 13.8 g/dL (ref 13.0–17.0)
Immature Granulocytes: 0 %
Lymphocytes Relative: 47 %
Lymphs Abs: 2.8 10*3/uL (ref 0.7–4.0)
MCH: 34.8 pg — ABNORMAL HIGH (ref 26.0–34.0)
MCHC: 34.9 g/dL (ref 30.0–36.0)
MCV: 99.7 fL (ref 80.0–100.0)
Monocytes Absolute: 0.6 10*3/uL (ref 0.1–1.0)
Monocytes Relative: 11 %
Neutro Abs: 2.2 10*3/uL (ref 1.7–7.7)
Neutrophils Relative %: 37 %
Platelet Count: 188 10*3/uL (ref 150–400)
RBC: 3.96 MIL/uL — ABNORMAL LOW (ref 4.22–5.81)
RDW: 12.1 % (ref 11.5–15.5)
WBC Count: 6 10*3/uL (ref 4.0–10.5)
nRBC: 0 % (ref 0.0–0.2)

## 2020-12-14 LAB — CMP (CANCER CENTER ONLY)
ALT: 36 U/L (ref 0–44)
AST: 35 U/L (ref 15–41)
Albumin: 4.1 g/dL (ref 3.5–5.0)
Alkaline Phosphatase: 57 U/L (ref 38–126)
Anion gap: 7 (ref 5–15)
BUN: 15 mg/dL (ref 8–23)
CO2: 28 mmol/L (ref 22–32)
Calcium: 9.7 mg/dL (ref 8.9–10.3)
Chloride: 103 mmol/L (ref 98–111)
Creatinine: 1.01 mg/dL (ref 0.61–1.24)
GFR, Estimated: >60 mL/min (ref >60)
Glucose, Bld: 109 mg/dL — ABNORMAL HIGH (ref 70–99)
Potassium: 3.9 mmol/L (ref 3.5–5.1)
Sodium: 138 mmol/L (ref 135–145)
Total Bilirubin: 0.8 mg/dL (ref 0.3–1.2)
Total Protein: 6.7 g/dL (ref 6.5–8.1)

## 2020-12-15 ENCOUNTER — Ambulatory Visit (HOSPITAL_COMMUNITY)
Admission: RE | Admit: 2020-12-15 | Discharge: 2020-12-15 | Disposition: A | Discharge status: Home / Self Care | Payer: Medicare Other | Source: Ambulatory Visit | Attending: Internal Medicine | Admitting: Internal Medicine

## 2020-12-15 ENCOUNTER — Encounter (HOSPITAL_COMMUNITY): Payer: Self-pay

## 2020-12-15 DIAGNOSIS — R911 Solitary pulmonary nodule: Secondary | ICD-10-CM | POA: Diagnosis not present

## 2020-12-15 DIAGNOSIS — I700 Atherosclerosis of aorta: Secondary | ICD-10-CM | POA: Diagnosis not present

## 2020-12-15 DIAGNOSIS — C3490 Malignant neoplasm of unspecified part of unspecified bronchus or lung: Secondary | ICD-10-CM | POA: Insufficient documentation

## 2020-12-15 MED ORDER — IOHEXOL 350 MG/ML SOLN
75.0000 mL | Freq: Once | INTRAVENOUS | Status: AC | PRN
  Administered 2020-12-15: 75 mL via INTRAVENOUS

## 2020-12-17 ENCOUNTER — Inpatient Hospital Stay (HOSPITAL_BASED_OUTPATIENT_CLINIC_OR_DEPARTMENT_OTHER): Payer: Medicare Other | Admitting: Internal Medicine

## 2020-12-17 ENCOUNTER — Other Ambulatory Visit: Payer: Self-pay

## 2020-12-17 VITALS — BP 141/79 | HR 60 | Temp 97.4°F | Resp 19 | Ht 73.0 in | Wt 243.4 lb

## 2020-12-17 DIAGNOSIS — Z5111 Encounter for antineoplastic chemotherapy: Secondary | ICD-10-CM

## 2020-12-17 DIAGNOSIS — E7800 Pure hypercholesterolemia, unspecified: Secondary | ICD-10-CM | POA: Diagnosis not present

## 2020-12-17 DIAGNOSIS — C3411 Malignant neoplasm of upper lobe, right bronchus or lung: Secondary | ICD-10-CM | POA: Diagnosis not present

## 2020-12-17 DIAGNOSIS — C3431 Malignant neoplasm of lower lobe, right bronchus or lung: Secondary | ICD-10-CM

## 2020-12-17 DIAGNOSIS — E039 Hypothyroidism, unspecified: Secondary | ICD-10-CM | POA: Diagnosis not present

## 2020-12-17 DIAGNOSIS — I10 Essential (primary) hypertension: Secondary | ICD-10-CM | POA: Diagnosis not present

## 2020-12-17 DIAGNOSIS — Z79899 Other long term (current) drug therapy: Secondary | ICD-10-CM | POA: Diagnosis not present

## 2020-12-17 DIAGNOSIS — Z7982 Long term (current) use of aspirin: Secondary | ICD-10-CM | POA: Diagnosis not present

## 2020-12-17 NOTE — Progress Notes (Signed)
[facility] [phone]   [phone]  OFFICE PROGRESS NOTE  [name], MD 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  PRIOR THERAPY: 1) Status post right upper lobectomy under the care of Dr. Roxan Hockey on 01/23/2006.  2) Tarceva 150 mg by mouth daily as part of the BMS checkmate 370 clinical trial. Status post 6 weeks of treatment.  CURRENT THERAPY: Tarceva 100 mg by mouth daily as part of the BMS Checkmate 370 clinical trial. First dose started 07/17/2014 status post 77 months of treatment.  INTERVAL HISTORY: [name] 75 y.o. male returns to the clinic today for follow-up visit.  The patient is feeling fine today with no concerning complaints.  He denied having any current chest pain, shortness of breath, cough or hemoptysis.  He denied having any nausea, vomiting, diarrhea or constipation.  He has no headache or visual changes.  He denied having any skin rash.  He continues to tolerate his treatment with Tarceva fairly well.  The patient had repeat CT scan of the chest, abdomen pelvis performed recently and he is here for evaluation and discussion of his discuss results.   MEDICAL HISTORY: Past Medical History:  Diagnosis Date   Cholelithiases 6/65/9935   Complication of anesthesia 2007   quit breathing with bronchoscopy, had to spend night   History of agent Orange exposure 44-45 yrs ago   History of pneumonia  last 3-4 yrs ago   3 -4 different times   Hypercholesterolemia    Hypertension    Hypothyroidism    lung ca dx'd 2007   lung right   Sinus disease    hx of   Sleep apnea    uses cpap setting opf 12    ALLERGIES:  is allergic to lisinopril and prednisone.  MEDICATIONS:  Current Outpatient Medications  Medication Sig Dispense  Refill   amitriptyline (ELAVIL) 50 MG tablet Take 50 mg by mouth at bedtime. Reported on 06/18/2015     amLODipine (NORVASC) 5 MG tablet Take 1 tablet (5 mg total) by mouth daily. 90 tablet 3   aspirin 81 MG tablet Take 81 mg by mouth every morning.      Cholecalciferol 2000 units CAPS Take 2,000 Units by mouth daily.      clindamycin (CLEOCIN T) 1 % external solution APPLY TO AFFECTED AREA(S) TWO TIMES A DAY AS NEEDED FOR SKIN IRRITATION 30 mL 0   erlotinib (TARCEVA) 100 MG tablet Take 1 tablet (100 mg total) by mouth daily. Take on an empty stomach 1 hour before meals or 2 hours after.faxed to [facility] 90 tablet 3   levETIRAcetam (KEPPRA) 750 MG tablet Take 750 mg by mouth at bedtime.      metoprolol succinate (TOPROL-XL) 25 MG 24 hr tablet Take two tablets (50 mg) by mouth every morning.  Take one tablet (25 mg) by mouth every evening.  Take with or immediately following a meal. 270 tablet 3   Multiple Vitamins-Minerals (CENTRUM SILVER PO) Take 1 tablet by mouth daily.      nitroGLYCERIN (NITROSTAT) 0.4 MG SL tablet PLACE ONE TABLET UNDER THE TONGUE EVERY 5 MINUTES AS NEEDED FOR CHEST PAIN 25 tablet 10   polyethylene glycol (MIRALAX / GLYCOLAX) packet Take 17 g by mouth daily. Reported on 08/27/2015  psyllium (METAMUCIL) 58.6 % powder Take 1 packet by mouth daily. Reported on 08/27/2015     rosuvastatin (CRESTOR) 5 MG tablet Take 5 mg by mouth every morning.      SYNTHROID 112 MCG tablet Take 112 mcg by mouth daily.     valsartan-hydrochlorothiazide (DIOVAN-HCT) 320-25 MG per tablet Take 1 tablet by mouth every morning.      No current facility-administered medications for this visit.    SURGICAL HISTORY:  Past Surgical History:  Procedure Laterality Date   BIOPSY  05/21/2018   Procedure: BIOPSY;  Surgeon: Wonda Horner, MD;  Location: [facility];  Service: Endoscopy;;   COLONOSCOPY WITH PROPOFOL N/A 03/19/2013   Procedure: COLONOSCOPY WITH PROPOFOL;  Surgeon: Garlan Fair, MD;   Location: [facility];  Service: Endoscopy;  Laterality: N/A;   Cystourethroscopy, [facility].  04/16/2010   ESOPHAGOGASTRODUODENOSCOPY (EGD) WITH PROPOFOL N/A 05/21/2018   Procedure: ESOPHAGOGASTRODUODENOSCOPY (EGD) WITH PROPOFOL;  Surgeon: Wonda Horner, MD;  Location: [facility];  Service: Endoscopy;  Laterality: N/A;   NASAL SINUS SURGERY  1987   Right video-assisted thoracoscopy, right upper lobectomy and  01/23/2006   The Olympus video bronchoscope was introduced via the right  12/21/2005   TONSILLECTOMY  1957   Torn medial and lateral menisci, left knee.  11/06/2006    REVIEW OF SYSTEMS:  Constitutional: negative Eyes: negative Ears, nose, mouth, throat, and face: negative Respiratory: negative Cardiovascular: negative Gastrointestinal: negative Genitourinary:negative Integument/breast: negative Hematologic/lymphatic: negative Musculoskeletal:negative Neurological: negative Behavioral/Psych: negative Endocrine: negative Allergic/Immunologic: negative   PHYSICAL EXAMINATION: General appearance: alert, cooperative, and no distress Head: Normocephalic, without obvious abnormality, atraumatic Neck: no adenopathy, no JVD, supple, symmetrical, trachea midline, and thyroid not enlarged, symmetric, no tenderness/mass/nodules Lymph nodes: Cervical, supraclavicular, and axillary nodes normal. Resp: clear to auscultation bilaterally Back: symmetric, no curvature. ROM normal. No CVA tenderness. Cardio: regular rate and rhythm, S1, S2 normal, no murmur, click, rub or gallop GI: soft, non-tender; bowel sounds normal; no masses,  no organomegaly Extremities: extremities normal, atraumatic, no cyanosis or edema Neurologic: Alert and oriented X 3, normal strength and tone. Normal symmetric reflexes. Normal coordination and gait  ECOG PERFORMANCE STATUS: 1 - Symptomatic but completely ambulatory  Blood pressure (!) 141/79, pulse 60, temperature (!) 97.4 F (36.3 C), temperature source  Oral, resp. rate 19, height _0  (1.854 m), weight 243 lb 6.4 oz (110.4 kg), SpO2 98 %.  LABORATORY DATA: Lab Results  Component Value Date   WBC 6.0 12/14/2020   HGB 13.8 12/14/2020   HCT 39.5 12/14/2020   MCV 99.7 12/14/2020   PLT 188 12/14/2020      Chemistry      Component Value Date/Time   NA 138 12/14/2020 0922   NA 141 03/11/2019 0959   NA 139 04/06/2017 0932   K 3.9 12/14/2020 0922   K 3.8 04/06/2017 0932   CL 103 12/14/2020 0922   CL 102 04/24/2012 0853   CO2 28 12/14/2020 0922   CO2 29 04/06/2017 0932   BUN 15 12/14/2020 0922   BUN 18 03/11/2019 0959   BUN 14.4 04/06/2017 0932   CREATININE 1.01 12/14/2020 0922   CREATININE 1.0 04/06/2017 0932      Component Value Date/Time   CALCIUM 9.7 12/14/2020 0922   CALCIUM 9.5 04/06/2017 0932   ALKPHOS 57 12/14/2020 0922   ALKPHOS 58 04/06/2017 0932   AST 35 12/14/2020 0922   AST 27 04/06/2017 0932   ALT 36 12/14/2020 0922   ALT 27 04/06/2017 0932  BILITOT 0.8 12/14/2020 0922   BILITOT 0.45 04/06/2017 0932       RADIOGRAPHIC STUDIES: CT Chest W Contrast  Result Date: 12/15/2020 CLINICAL DATA:  Primary Cancer Type: Lung Imaging Indication: Assess response to therapy Interval therapy since last imaging? Yes Initial Cancer Diagnosis Date: 12/21/2005; Established by: Biopsy-proven Detailed Pathology: Recurrent non-small cell lung cancer, adenocarcinoma, initially diagnosed as a stage IA. Primary Tumor location:  Right upper lobe. Recurrence? Yes; Date(s) of recurrence: 05/14/2014; Established by: Biopsy-proven Surgeries: Right upper lobectomy 01/23/2006. Chemotherapy: Yes; Ongoing? Yes; Most recent administration: Tarceva daily Immunotherapy? No Radiation therapy? No EXAM: CT CHEST WITH CONTRAST TECHNIQUE: Multidetector CT imaging of the chest was performed during intravenous contrast administration. CONTRAST:  35m OMNIPAQUE IOHEXOL 350 MG/ML SOLN, additional oral enteric contrast COMPARISON:  Most recent CT chest,  abdomen and pelvis 08/11/2020. 05/07/2014 PET-CT. FINDINGS: CT CHEST FINDINGS Cardiovascular: Aortic atherosclerosis. Normal heart size. No pericardial effusion. Mediastinum/Nodes: No enlarged mediastinal, hilar, or axillary lymph nodes. Thyroid gland, trachea, and esophagus demonstrate no significant findings. Lungs/Pleura: Redemonstrated postoperative findings of right upper lobectomy. Multiple nodules are again seen in the right lower lobe, unchanged compared to prior examination, largest nodule anteriorly measuring 2.2 x 1.3 cm (series 6, image 64) and an additional subpleural index nodule adjacent to the lobectomy suture site measuring 2.2 x 1.2 cm (series 6, image 49). No pleural effusion or pneumothorax. Musculoskeletal: No chest wall mass or suspicious bone lesions identified. CT ABDOMEN PELVIS FINDINGS Hepatobiliary: No solid liver abnormality is seen. Rim calcified gallstone in the gallbladder. Gallbladder wall thickening, or biliary dilatation. Pancreas: Unremarkable. No pancreatic ductal dilatation or surrounding inflammatory changes. Spleen: Normal in size without significant abnormality. Adrenals/Urinary Tract: Adrenal glands are unremarkable. Kidneys are normal, without renal calculi, solid lesion, or hydronephrosis. Mild thickening of the urinary bladder, likely due to chronic outlet obstruction. Stomach/Bowel: Stomach is within normal limits. Appendix appears normal. No evidence of bowel wall thickening, distention, or inflammatory changes. Sigmoid diverticula. Vascular/Lymphatic: Aortic atherosclerosis. No enlarged abdominal or pelvic lymph nodes. Reproductive: No mass or other abnormality. Other: Small, fat containing bilateral inguinal hernias. No abdominopelvic ascites. Musculoskeletal: No acute or significant osseous findings. IMPRESSION: 1. Redemonstrated postoperative findings of right upper lobectomy. 2. Multiple nodules are again seen in the right lower lobe, unchanged compared to prior  examination. Findings are consistent with stable disease. 3. No evidence of distant metastatic disease in the abdomen or pelvis. 4. Cholelithiasis. Aortic Atherosclerosis (ICD10-I70.0). Electronically Signed   By: AEddie CandleM.D.   On: 12/15/2020 11:33   CT Abdomen Pelvis W Contrast  Result Date: 12/15/2020 CLINICAL DATA:  Primary Cancer Type: Lung Imaging Indication: Assess response to therapy Interval therapy since last imaging? Yes Initial Cancer Diagnosis Date: 12/21/2005; Established by: Biopsy-proven Detailed Pathology: Recurrent non-small cell lung cancer, adenocarcinoma, initially diagnosed as a stage IA. Primary Tumor location:  Right upper lobe. Recurrence? Yes; Date(s) of recurrence: 05/14/2014; Established by: Biopsy-proven Surgeries: Right upper lobectomy 01/23/2006. Chemotherapy: Yes; Ongoing? Yes; Most recent administration: Tarceva daily Immunotherapy? No Radiation therapy? No EXAM: CT CHEST WITH CONTRAST TECHNIQUE: Multidetector CT imaging of the chest was performed during intravenous contrast administration. CONTRAST:  729mOMNIPAQUE IOHEXOL 350 MG/ML SOLN, additional oral enteric contrast COMPARISON:  Most recent CT chest, abdomen and pelvis 08/11/2020. 05/07/2014 PET-CT. FINDINGS: CT CHEST FINDINGS Cardiovascular: Aortic atherosclerosis. Normal heart size. No pericardial effusion. Mediastinum/Nodes: No enlarged mediastinal, hilar, or axillary lymph nodes. Thyroid gland, trachea, and esophagus demonstrate no significant findings. Lungs/Pleura: Redemonstrated postoperative findings of right upper lobectomy.  Multiple nodules are again seen in the right lower lobe, unchanged compared to prior examination, largest nodule anteriorly measuring 2.2 x 1.3 cm (series 6, image 64) and an additional subpleural index nodule adjacent to the lobectomy suture site measuring 2.2 x 1.2 cm (series 6, image 49). No pleural effusion or pneumothorax. Musculoskeletal: No chest wall mass or suspicious bone lesions  identified. CT ABDOMEN PELVIS FINDINGS Hepatobiliary: No solid liver abnormality is seen. Rim calcified gallstone in the gallbladder. Gallbladder wall thickening, or biliary dilatation. Pancreas: Unremarkable. No pancreatic ductal dilatation or surrounding inflammatory changes. Spleen: Normal in size without significant abnormality. Adrenals/Urinary Tract: Adrenal glands are unremarkable. Kidneys are normal, without renal calculi, solid lesion, or hydronephrosis. Mild thickening of the urinary bladder, likely due to chronic outlet obstruction. Stomach/Bowel: Stomach is within normal limits. Appendix appears normal. No evidence of bowel wall thickening, distention, or inflammatory changes. Sigmoid diverticula. Vascular/Lymphatic: Aortic atherosclerosis. No enlarged abdominal or pelvic lymph nodes. Reproductive: No mass or other abnormality. Other: Small, fat containing bilateral inguinal hernias. No abdominopelvic ascites. Musculoskeletal: No acute or significant osseous findings. IMPRESSION: 1. Redemonstrated postoperative findings of right upper lobectomy. 2. Multiple nodules are again seen in the right lower lobe, unchanged compared to prior examination. Findings are consistent with stable disease. 3. No evidence of distant metastatic disease in the abdomen or pelvis. 4. Cholelithiasis. Aortic Atherosclerosis (ICD10-I70.0). Electronically Signed   By: Eddie Candle M.D.   On: 12/15/2020 11:33     ASSESSMENT AND PLAN:  This is a very pleasant 75 years old white male with recurrent non-small cell lung cancer, adenocarcinoma and positive EGFR mutation in exon 21. He is currently on Tarceva 100 mg by mouth daily status post around 77 months of treatment. The patient continues to tolerate his treatment with Tarceva fairly well with no concerning adverse effects. He had repeat CT scan of the chest, abdomen pelvis performed recently.  I personally and independently reviewed the scans and discussed the results with  the patient today. His scan showed no concerning findings for disease progression. I recommended for the patient to continue his current treatment with Tarceva with the same dose. I will see him back for follow-up visit in 2 months for evaluation and repeat blood work. The patient is interested in receiving the flu vaccine as well as COVID booster and I will give him the okay to do that with his current treatment. He was advised to call immediately if he has any other concerning symptoms in the interval. The patient voices understanding of current disease status and treatment options and is in agreement with the current care plan. All questions were answered. The patient knows to call the clinic with any problems, questions or concerns. We can certainly see the patient much sooner if necessary. The total time spent in the appointment was 30 minutes.  Disclaimer: This note was dictated with voice recognition software. Similar sounding words can inadvertently be transcribed and may not be corrected upon review.

## 2020-12-22 ENCOUNTER — Telehealth: Payer: Self-pay | Admitting: Student-PharmD

## 2020-12-22 ENCOUNTER — Ambulatory Visit: Payer: Medicare Other | Admitting: Thoracic Surgery (Cardiothoracic Vascular Surgery)

## 2020-12-22 NOTE — Telephone Encounter (Signed)
Patient returned call. States that switching amlodipine to the evening "has done wonders" for his BP. Most home readings are 110s-120s/60s-70s, HR 50s. Reports that when he saw his oncologist his BP was 128/79 but somehow was keyed in as 141/79. States that he has not seen any systolic BP in the 224M. He is very pleased with the improvements he has seen in his BP. Since home BP at goal and last office BP per patient report at goal, will continue current regimen: amlodipine 5 mg nightly, valsartan-HCTZ 320-25 mg daily, and metoprolol succinate 50 mg in the morning and 25 mg in the evening.   Encouraged patient to continue checking BP at home and to call office if he sees numbers creeping up. He can follow up with Dr. Tamala Julian as otherwise planned.

## 2020-12-22 NOTE — Telephone Encounter (Signed)
Called patient to see how his BP are running at home since switching amlodipine 5 mg to the evening at last HTN clinic visit on 8/23. BP at that time was at goal at home, but slightly elevated in office. Have previously confirmed accuracy of both his home wrist and bicep cuffs. If BP elevated at home at this time, plan to increase amlodipine to 10 mg daily. Otherwise, continue current medications and follow up as needed.   Unable to reach patient, LVM requesting call back.

## 2021-01-05 ENCOUNTER — Other Ambulatory Visit: Payer: Self-pay

## 2021-01-05 ENCOUNTER — Ambulatory Visit (INDEPENDENT_AMBULATORY_CARE_PROVIDER_SITE_OTHER): Payer: Medicare Other | Admitting: Thoracic Surgery (Cardiothoracic Vascular Surgery)

## 2021-01-05 VITALS — BP 150/81 | HR 67 | Resp 20 | Ht 73.0 in | Wt 248.0 lb

## 2021-01-05 DIAGNOSIS — Z902 Acquired absence of lung [part of]: Secondary | ICD-10-CM | POA: Diagnosis not present

## 2021-01-05 DIAGNOSIS — C3431 Malignant neoplasm of lower lobe, right bronchus or lung: Secondary | ICD-10-CM

## 2021-01-05 DIAGNOSIS — I209 Angina pectoris, unspecified: Secondary | ICD-10-CM | POA: Diagnosis not present

## 2021-01-05 NOTE — Progress Notes (Signed)
[address]       [place] [address]             [phone]     HPI: Mr. Full returns for a scheduled annual follow-up visit  [name] is a 75 year old man with history of hypertension, hyperlipidemia, hypothyroidism, agent orange exposure, adenocarcinoma of the lung, and sleep apnea.  He is a lifelong non-smoker.  He had a stage Ia adenocarcinoma in 2007 treated with a right upper lobectomy.  He did well for almost a decade before developing recurrence.  Biopsy-proven recurrence was adenocarcinoma that was EGFR positive.  He has been on Tarceva since that time.  Over the past year he has been doing well.  There was a question in May that one nodule might be larger, so a repeat CT was done in 4 months.  That showed no significant change.  He continues to tolerate treatment with Tarceva.  Overall he feels well and remains active.  Shortness of breath with heavy exertion which is unchanged.  No change in appetite or weight loss.  Past Medical History:  Diagnosis Date   Cholelithiases 9/77/4142   Complication of anesthesia 2007   quit breathing with bronchoscopy, had to spend night   History of agent Orange exposure 44-45 yrs ago   History of pneumonia  last 3-4 yrs ago   3 -4 different times   Hypercholesterolemia    Hypertension    Hypothyroidism    lung ca dx'd 2007   lung right   Sinus disease    hx of   Sleep apnea    uses cpap setting opf 12    Current Outpatient Medications  Medication Sig Dispense Refill   amitriptyline (ELAVIL) 50 MG tablet Take 50 mg by mouth at bedtime. Reported on 06/18/2015     amLODipine (NORVASC) 5 MG tablet Take 1 tablet (5 mg total) by mouth daily. 90 tablet 3   aspirin 81 MG tablet Take 81 mg by mouth every morning.      Cholecalciferol 2000 units CAPS Take 2,000 Units by mouth daily.      clindamycin (CLEOCIN T) 1 % external solution APPLY TO AFFECTED AREA(S) TWO TIMES A DAY AS NEEDED FOR SKIN IRRITATION 30 mL 0    erlotinib (TARCEVA) 100 MG tablet Take 1 tablet (100 mg total) by mouth daily. Take on an empty stomach 1 hour before meals or 2 hours after.faxed to [facility] 90 tablet 3   levETIRAcetam (KEPPRA) 750 MG tablet Take 750 mg by mouth at bedtime.      metoprolol succinate (TOPROL-XL) 25 MG 24 hr tablet Take two tablets (50 mg) by mouth every morning.  Take one tablet (25 mg) by mouth every evening.  Take with or immediately following a meal. 270 tablet 3   Multiple Vitamins-Minerals (CENTRUM SILVER PO) Take 1 tablet by mouth daily.      nitroGLYCERIN (NITROSTAT) 0.4 MG SL tablet PLACE ONE TABLET UNDER THE TONGUE EVERY 5 MINUTES AS NEEDED FOR CHEST PAIN 25 tablet 10   polyethylene glycol (MIRALAX / GLYCOLAX) packet Take 17 g by mouth daily. Reported on 08/27/2015     psyllium (METAMUCIL) 58.6 % powder Take 1 packet by mouth daily. Reported on 08/27/2015     rosuvastatin (CRESTOR) 5 MG tablet Take 5 mg by mouth every morning.      SYNTHROID 112 MCG tablet Take 112 mcg by mouth daily.     valsartan-hydrochlorothiazide (DIOVAN-HCT) 320-25 MG per tablet Take  1 tablet by mouth every morning.      No current facility-administered medications for this visit.    Physical Exam BP (!) 150/81   Pulse 67   Resp 20   Ht '6\' 1"'  (1.854 m)   Wt 248 lb (112.5 kg)   SpO2 98% Comment: RA  BMI 32.67 kg/m  75 year old man in no acute distress Alert and oriented x3 with no focal deficits No cervical or supraclavicular adenopathy Lungs clear bilaterally Cardiac regular rate and rhythm  Diagnostic Tests: CT CHEST WITH CONTRAST   TECHNIQUE: Multidetector CT imaging of the chest was performed during intravenous contrast administration.   CONTRAST:  26m OMNIPAQUE IOHEXOL 350 MG/ML SOLN, additional oral enteric contrast   COMPARISON:  Most recent CT chest, abdomen and pelvis 08/11/2020. 05/07/2014 PET-CT.   FINDINGS: CT CHEST FINDINGS   Cardiovascular: Aortic atherosclerosis. Normal heart size.  No pericardial effusion.   Mediastinum/Nodes: No enlarged mediastinal, hilar, or axillary lymph nodes. Thyroid gland, trachea, and esophagus demonstrate no significant findings.   Lungs/Pleura: Redemonstrated postoperative findings of right upper lobectomy. Multiple nodules are again seen in the right lower lobe, unchanged compared to prior examination, largest nodule anteriorly measuring 2.2 x 1.3 cm (series 6, image 64) and an additional subpleural index nodule adjacent to the lobectomy suture site measuring 2.2 x 1.2 cm (series 6, image 49). No pleural effusion or pneumothorax.   Musculoskeletal: No chest wall mass or suspicious bone lesions identified.   CT ABDOMEN PELVIS FINDINGS   Hepatobiliary: No solid liver abnormality is seen. Rim calcified gallstone in the gallbladder. Gallbladder wall thickening, or biliary dilatation.   Pancreas: Unremarkable. No pancreatic ductal dilatation or surrounding inflammatory changes.   Spleen: Normal in size without significant abnormality.   Adrenals/Urinary Tract: Adrenal glands are unremarkable. Kidneys are normal, without renal calculi, solid lesion, or hydronephrosis. Mild thickening of the urinary bladder, likely due to chronic outlet obstruction.   Stomach/Bowel: Stomach is within normal limits. Appendix appears normal. No evidence of bowel wall thickening, distention, or inflammatory changes. Sigmoid diverticula.   Vascular/Lymphatic: Aortic atherosclerosis. No enlarged abdominal or pelvic lymph nodes.   Reproductive: No mass or other abnormality.   Other: Small, fat containing bilateral inguinal hernias. No abdominopelvic ascites.   Musculoskeletal: No acute or significant osseous findings.   IMPRESSION: 1. Redemonstrated postoperative findings of right upper lobectomy. 2. Multiple nodules are again seen in the right lower lobe, unchanged compared to prior examination. Findings are consistent with stable  disease. 3. No evidence of distant metastatic disease in the abdomen or pelvis. 4. Cholelithiasis.   Aortic Atherosclerosis (ICD10-I70.0).     Electronically Signed   By: AEddie CandleM.D.   On: 12/15/2020 11:33 I personally reviewed the CT images and compared them to the images from May and November.  I see no significant change in the dominant nodule in the anterior portion of the lower lobe.  Impression: Andrew Seranois a 75year old man with history of hypertension, hyperlipidemia, hypothyroidism, agent orange exposure, adenocarcinoma of the lung, and sleep apnea.    First diagnosed with adenocarcinoma of the lung in 2007.  He had a right upper lobectomy at that time.  He developed recurrence in 2016.  Biopsy showed adenocarcinoma.  Molecular testing showed the tumor was EGFR positive.  He has been on Tarceva since that time.  Clinically he is doing well.  He feels well.  His respiratory status is unchanged.  No change in appetite or weight loss.  CT scanning shows stable disease  with no progression.  Plan: Continue treatment per Dr. [name] Return in 1 year after CT  Melrose Nakayama, MD [facility] [phone]

## 2021-01-22 DIAGNOSIS — Z23 Encounter for immunization: Secondary | ICD-10-CM | POA: Diagnosis not present

## 2021-02-07 DIAGNOSIS — Z23 Encounter for immunization: Secondary | ICD-10-CM | POA: Diagnosis not present

## 2021-02-18 ENCOUNTER — Other Ambulatory Visit: Payer: Self-pay

## 2021-02-18 ENCOUNTER — Inpatient Hospital Stay (HOSPITAL_BASED_OUTPATIENT_CLINIC_OR_DEPARTMENT_OTHER): Payer: Medicare Other | Admitting: Internal Medicine

## 2021-02-18 ENCOUNTER — Inpatient Hospital Stay: Payer: Medicare Other | Attending: Internal Medicine

## 2021-02-18 VITALS — BP 134/69 | HR 62 | Temp 97.7°F | Resp 18 | Ht 73.0 in | Wt 244.9 lb

## 2021-02-18 DIAGNOSIS — I10 Essential (primary) hypertension: Secondary | ICD-10-CM | POA: Insufficient documentation

## 2021-02-18 DIAGNOSIS — Z9079 Acquired absence of other genital organ(s): Secondary | ICD-10-CM | POA: Diagnosis not present

## 2021-02-18 DIAGNOSIS — Z888 Allergy status to other drugs, medicaments and biological substances status: Secondary | ICD-10-CM | POA: Insufficient documentation

## 2021-02-18 DIAGNOSIS — Z79899 Other long term (current) drug therapy: Secondary | ICD-10-CM | POA: Insufficient documentation

## 2021-02-18 DIAGNOSIS — Z8701 Personal history of pneumonia (recurrent): Secondary | ICD-10-CM | POA: Insufficient documentation

## 2021-02-18 DIAGNOSIS — C3431 Malignant neoplasm of lower lobe, right bronchus or lung: Secondary | ICD-10-CM | POA: Diagnosis not present

## 2021-02-18 DIAGNOSIS — R091 Pleurisy: Secondary | ICD-10-CM | POA: Diagnosis not present

## 2021-02-18 DIAGNOSIS — G4730 Sleep apnea, unspecified: Secondary | ICD-10-CM | POA: Diagnosis not present

## 2021-02-18 LAB — CBC WITH DIFFERENTIAL (CANCER CENTER ONLY)
Abs Immature Granulocytes: 0.01 10*3/uL (ref 0.00–0.07)
Basophils Absolute: 0 10*3/uL (ref 0.0–0.1)
Basophils Relative: 0 %
Eosinophils Absolute: 0.3 10*3/uL (ref 0.0–0.5)
Eosinophils Relative: 4 %
HCT: 38.5 % — ABNORMAL LOW (ref 39.0–52.0)
Hemoglobin: 13.2 g/dL (ref 13.0–17.0)
Immature Granulocytes: 0 %
Lymphocytes Relative: 47 %
Lymphs Abs: 2.8 10*3/uL (ref 0.7–4.0)
MCH: 34.1 pg — ABNORMAL HIGH (ref 26.0–34.0)
MCHC: 34.3 g/dL (ref 30.0–36.0)
MCV: 99.5 fL (ref 80.0–100.0)
Monocytes Absolute: 0.6 10*3/uL (ref 0.1–1.0)
Monocytes Relative: 10 %
Neutro Abs: 2.4 10*3/uL (ref 1.7–7.7)
Neutrophils Relative %: 39 %
Platelet Count: 196 10*3/uL (ref 150–400)
RBC: 3.87 MIL/uL — ABNORMAL LOW (ref 4.22–5.81)
RDW: 11.9 % (ref 11.5–15.5)
WBC Count: 6.1 10*3/uL (ref 4.0–10.5)
nRBC: 0 % (ref 0.0–0.2)

## 2021-02-18 LAB — CMP (CANCER CENTER ONLY)
ALT: 34 U/L (ref 0–44)
AST: 32 U/L (ref 15–41)
Albumin: 4 g/dL (ref 3.5–5.0)
Alkaline Phosphatase: 59 U/L (ref 38–126)
Anion gap: 8 (ref 5–15)
BUN: 15 mg/dL (ref 8–23)
CO2: 28 mmol/L (ref 22–32)
Calcium: 9.2 mg/dL (ref 8.9–10.3)
Chloride: 103 mmol/L (ref 98–111)
Creatinine: 1 mg/dL (ref 0.61–1.24)
GFR, Estimated: >60 mL/min (ref >60)
Glucose, Bld: 102 mg/dL — ABNORMAL HIGH (ref 70–99)
Potassium: 3.6 mmol/L (ref 3.5–5.1)
Sodium: 139 mmol/L (ref 135–145)
Total Bilirubin: 0.5 mg/dL (ref 0.3–1.2)
Total Protein: 6.5 g/dL (ref 6.5–8.1)

## 2021-02-18 NOTE — Progress Notes (Signed)
[place] [phone]   Fax:(336) [phone]  OFFICE PROGRESS NOTE  [name], MD 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  PRIOR THERAPY: 1) Status post right upper lobectomy under the care of Dr. Roxan Hockey on 01/23/2006.  2) Tarceva 150 mg by mouth daily as part of the BMS checkmate 370 clinical trial. Status post 6 weeks of treatment.  CURRENT THERAPY: Tarceva 100 mg by mouth daily as part of the BMS Checkmate 370 clinical trial. First dose started 07/17/2014 status post 79 months of treatment.  INTERVAL HISTORY: [name] 75 y.o. male returns to the clinic today for follow-up visit.  The patient is feeling fine today with no concerning complaints except for occasional pain on the right side of the chest that resolved spontaneously.  He denied having any current shortness of breath, cough or hemoptysis.  He denied having any fever or chills.  He has no nausea, vomiting, diarrhea or constipation.  He has no headache or visual changes.  He denied having any significant weight loss or night sweats.  He continues to tolerate his treatment with Tarceva fairly well.  The patient is here today for evaluation and repeat blood work.  MEDICAL HISTORY: Past Medical History:  Diagnosis Date   Cholelithiases 4/65/0354   Complication of anesthesia 2007   quit breathing with bronchoscopy, had to spend night   History of agent Orange exposure 44-45 yrs ago   History of pneumonia  last 3-4 yrs ago   3 -4 different times   Hypercholesterolemia    Hypertension    Hypothyroidism    lung ca dx'd 2007   lung right   Sinus disease    hx of   Sleep apnea    uses cpap setting opf 12    ALLERGIES:  is allergic to lisinopril and prednisone.  MEDICATIONS:  Current  Outpatient Medications  Medication Sig Dispense Refill   amitriptyline (ELAVIL) 50 MG tablet Take 50 mg by mouth at bedtime. Reported on 06/18/2015     amLODipine (NORVASC) 5 MG tablet Take 1 tablet (5 mg total) by mouth daily. 90 tablet 3   aspirin 81 MG tablet Take 81 mg by mouth every morning.      Cholecalciferol 2000 units CAPS Take 2,000 Units by mouth daily.      clindamycin (CLEOCIN T) 1 % external solution APPLY TO AFFECTED AREA(S) TWO TIMES A DAY AS NEEDED FOR SKIN IRRITATION 30 mL 0   erlotinib (TARCEVA) 100 MG tablet Take 1 tablet (100 mg total) by mouth daily. Take on an empty stomach 1 hour before meals or 2 hours after.faxed to [facility] 90 tablet 3   levETIRAcetam (KEPPRA) 750 MG tablet Take 750 mg by mouth at bedtime.      metoprolol succinate (TOPROL-XL) 25 MG 24 hr tablet Take two tablets (50 mg) by mouth every morning.  Take one tablet (25 mg) by mouth every evening.  Take with or immediately following a meal. 270 tablet 3   Multiple Vitamins-Minerals (CENTRUM SILVER PO) Take 1 tablet by mouth daily.      nitroGLYCERIN (NITROSTAT) 0.4 MG SL tablet PLACE ONE TABLET UNDER THE TONGUE EVERY 5 MINUTES AS NEEDED FOR CHEST PAIN 25 tablet 10   polyethylene glycol (MIRALAX / GLYCOLAX) packet Take 17 g by mouth  daily. Reported on 08/27/2015     psyllium (METAMUCIL) 58.6 % powder Take 1 packet by mouth daily. Reported on 08/27/2015     rosuvastatin (CRESTOR) 5 MG tablet Take 5 mg by mouth every morning.      SYNTHROID 112 MCG tablet Take 112 mcg by mouth daily.     valsartan-hydrochlorothiazide (DIOVAN-HCT) 320-25 MG per tablet Take 1 tablet by mouth every morning.      No current facility-administered medications for this visit.    SURGICAL HISTORY:  Past Surgical History:  Procedure Laterality Date   BIOPSY  05/21/2018   Procedure: BIOPSY;  Surgeon: Wonda Horner, MD;  Location: [facility];  Service: Endoscopy;;   COLONOSCOPY WITH PROPOFOL N/A 03/19/2013   Procedure: COLONOSCOPY  WITH PROPOFOL;  Surgeon: Garlan Fair, MD;  Location: [facility];  Service: Endoscopy;  Laterality: N/A;   Cystourethroscopy, [facility] TURP.  04/16/2010   ESOPHAGOGASTRODUODENOSCOPY (EGD) WITH PROPOFOL N/A 05/21/2018   Procedure: ESOPHAGOGASTRODUODENOSCOPY (EGD) WITH PROPOFOL;  Surgeon: Wonda Horner, MD;  Location: [facility];  Service: Endoscopy;  Laterality: N/A;   NASAL SINUS SURGERY  1987   Right video-assisted thoracoscopy, right upper lobectomy and  01/23/2006   The Olympus video bronchoscope was introduced via the right  12/21/2005   TONSILLECTOMY  1957   Torn medial and lateral menisci, left knee.  11/06/2006    REVIEW OF SYSTEMS:  A comprehensive review of systems was negative except for: Respiratory: positive for pleurisy/chest pain   PHYSICAL EXAMINATION: General appearance: alert, cooperative, and no distress Head: Normocephalic, without obvious abnormality, atraumatic Neck: no adenopathy, no JVD, supple, symmetrical, trachea midline, and thyroid not enlarged, symmetric, no tenderness/mass/nodules Lymph nodes: Cervical, supraclavicular, and axillary nodes normal. Resp: clear to auscultation bilaterally Back: symmetric, no curvature. ROM normal. No CVA tenderness. Cardio: regular rate and rhythm, S1, S2 normal, no murmur, click, rub or gallop GI: soft, non-tender; bowel sounds normal; no masses,  no organomegaly Extremities: extremities normal, atraumatic, no cyanosis or edema  ECOG PERFORMANCE STATUS: 1 - Symptomatic but completely ambulatory  Blood pressure 134/69, pulse 62, temperature 97.7 F (36.5 C), temperature source Oral, resp. rate 18, height 6' 1" (1.854 m), weight 244 lb 14.4 oz (111.1 kg), SpO2 99 %.  LABORATORY DATA: Lab Results  Component Value Date   WBC 6.1 02/18/2021   HGB 13.2 02/18/2021   HCT 38.5 (L) 02/18/2021   MCV 99.5 02/18/2021   PLT 196 02/18/2021      Chemistry      Component Value Date/Time   NA 138 12/14/2020 0922   NA 141  03/11/2019 0959   NA 139 04/06/2017 0932   K 3.9 12/14/2020 0922   K 3.8 04/06/2017 0932   CL 103 12/14/2020 0922   CL 102 04/24/2012 0853   CO2 28 12/14/2020 0922   CO2 29 04/06/2017 0932   BUN 15 12/14/2020 0922   BUN 18 03/11/2019 0959   BUN 14.4 04/06/2017 0932   CREATININE 1.01 12/14/2020 0922   CREATININE 1.0 04/06/2017 0932      Component Value Date/Time   CALCIUM 9.7 12/14/2020 0922   CALCIUM 9.5 04/06/2017 0932   ALKPHOS 57 12/14/2020 0922   ALKPHOS 58 04/06/2017 0932   AST 35 12/14/2020 0922   AST 27 04/06/2017 0932   ALT 36 12/14/2020 0922   ALT 27 04/06/2017 0932   BILITOT 0.8 12/14/2020 0922   BILITOT 0.45 04/06/2017 0932       RADIOGRAPHIC STUDIES: No results found.   ASSESSMENT AND  PLAN:  This is a very pleasant 75 years old white male with recurrent non-small cell lung cancer, adenocarcinoma and positive EGFR mutation in exon 21. He is currently on Tarceva 100 mg by mouth daily status post around 79 months of treatment. The patient continues to tolerate his treatment with Tarceva fairly well with no concerning adverse effects. The patient has been tolerating this treatment well with no concerning adverse effects. I recommended for him to continue his current treatment with Tarceva with the same dose. I will see him back for follow-up visit in 2 months for evaluation and repeat blood work. We will have his imaging studies done every 6 months from now on. He was advised to call immediately if he has any other concerning symptoms in the interval. The patient voices understanding of current disease status and treatment options and is in agreement with the current care plan. All questions were answered. The patient knows to call the clinic with any problems, questions or concerns. We can certainly see the patient much sooner if necessary.  Disclaimer: This note was dictated with voice recognition software. Similar sounding words can inadvertently be transcribed  and may not be corrected upon review.

## 2021-03-30 ENCOUNTER — Other Ambulatory Visit: Payer: Self-pay | Admitting: Gastroenterology

## 2021-04-29 ENCOUNTER — Inpatient Hospital Stay: Payer: Medicare Other | Attending: Internal Medicine

## 2021-04-29 ENCOUNTER — Other Ambulatory Visit (HOSPITAL_COMMUNITY): Payer: Self-pay

## 2021-04-29 ENCOUNTER — Other Ambulatory Visit: Payer: Self-pay

## 2021-04-29 ENCOUNTER — Inpatient Hospital Stay (HOSPITAL_BASED_OUTPATIENT_CLINIC_OR_DEPARTMENT_OTHER): Payer: Medicare Other | Admitting: Internal Medicine

## 2021-04-29 ENCOUNTER — Encounter: Payer: Self-pay | Admitting: Internal Medicine

## 2021-04-29 VITALS — BP 140/72 | HR 60 | Temp 96.3°F | Resp 18 | Ht 73.0 in | Wt 248.5 lb

## 2021-04-29 DIAGNOSIS — Z79899 Other long term (current) drug therapy: Secondary | ICD-10-CM | POA: Diagnosis not present

## 2021-04-29 DIAGNOSIS — R21 Rash and other nonspecific skin eruption: Secondary | ICD-10-CM | POA: Diagnosis not present

## 2021-04-29 DIAGNOSIS — C3431 Malignant neoplasm of lower lobe, right bronchus or lung: Secondary | ICD-10-CM | POA: Insufficient documentation

## 2021-04-29 DIAGNOSIS — L270 Generalized skin eruption due to drugs and medicaments taken internally: Secondary | ICD-10-CM

## 2021-04-29 DIAGNOSIS — Z5111 Encounter for antineoplastic chemotherapy: Secondary | ICD-10-CM

## 2021-04-29 DIAGNOSIS — C3490 Malignant neoplasm of unspecified part of unspecified bronchus or lung: Secondary | ICD-10-CM

## 2021-04-29 LAB — CBC WITH DIFFERENTIAL (CANCER CENTER ONLY)
Abs Immature Granulocytes: 0.01 10*3/uL (ref 0.00–0.07)
Basophils Absolute: 0 10*3/uL (ref 0.0–0.1)
Basophils Relative: 1 %
Eosinophils Absolute: 0.2 10*3/uL (ref 0.0–0.5)
Eosinophils Relative: 4 %
HCT: 38.5 % — ABNORMAL LOW (ref 39.0–52.0)
Hemoglobin: 13.6 g/dL (ref 13.0–17.0)
Immature Granulocytes: 0 %
Lymphocytes Relative: 41 %
Lymphs Abs: 2.4 10*3/uL (ref 0.7–4.0)
MCH: 34.8 pg — ABNORMAL HIGH (ref 26.0–34.0)
MCHC: 35.3 g/dL (ref 30.0–36.0)
MCV: 98.5 fL (ref 80.0–100.0)
Monocytes Absolute: 0.7 10*3/uL (ref 0.1–1.0)
Monocytes Relative: 12 %
Neutro Abs: 2.4 10*3/uL (ref 1.7–7.7)
Neutrophils Relative %: 42 %
Platelet Count: 184 10*3/uL (ref 150–400)
RBC: 3.91 MIL/uL — ABNORMAL LOW (ref 4.22–5.81)
RDW: 12.3 % (ref 11.5–15.5)
WBC Count: 5.8 10*3/uL (ref 4.0–10.5)
nRBC: 0 % (ref 0.0–0.2)

## 2021-04-29 LAB — CMP (CANCER CENTER ONLY)
ALT: 30 U/L (ref 0–44)
AST: 35 U/L (ref 15–41)
Albumin: 4.4 g/dL (ref 3.5–5.0)
Alkaline Phosphatase: 55 U/L (ref 38–126)
Anion gap: 5 (ref 5–15)
BUN: 16 mg/dL (ref 8–23)
CO2: 32 mmol/L (ref 22–32)
Calcium: 9.7 mg/dL (ref 8.9–10.3)
Chloride: 102 mmol/L (ref 98–111)
Creatinine: 1.05 mg/dL (ref 0.61–1.24)
GFR, Estimated: >60 mL/min (ref >60)
Glucose, Bld: 105 mg/dL — ABNORMAL HIGH (ref 70–99)
Potassium: 3.6 mmol/L (ref 3.5–5.1)
Sodium: 139 mmol/L (ref 135–145)
Total Bilirubin: 0.7 mg/dL (ref 0.3–1.2)
Total Protein: 6.9 g/dL (ref 6.5–8.1)

## 2021-04-29 MED ORDER — CLINDAMYCIN PHOSPHATE 1 % EX SOLN: CUTANEOUS | 0 refills | Status: DC

## 2021-04-29 NOTE — Progress Notes (Signed)
[place] Telephone:(336) [phone]   Fax:(336) [phone]  OFFICE PROGRESS NOTE  [name], MD 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  PRIOR THERAPY: 1) Status post right upper lobectomy under the care of Dr. Roxan Hockey on 01/23/2006.  2) Tarceva 150 mg by mouth daily as part of the BMS checkmate 370 clinical trial. Status post 6 weeks of treatment.  CURRENT THERAPY: Tarceva 100 mg by mouth daily as part of the BMS Checkmate 370 clinical trial. First dose started 07/17/2014 status post 81 months of treatment.  INTERVAL HISTORY: [name] 76 y.o. male returns to the clinic today for follow-up visit.  The patient is feeling fine today with no concerning complaints except for mild skin rash and he is requesting refill of clindamycin lotion.  He also has intermittent pain on the right side of the chest.  He denied having any shortness of breath, cough or hemoptysis.  He denied having any fever or chills.  He has no nausea, vomiting, diarrhea or constipation.  He has no headache or visual changes.  He continues to tolerate his treatment with Tarceva fairly well.  The patient is here today for evaluation and repeat blood work.  MEDICAL HISTORY: Past Medical History:  Diagnosis Date   Cholelithiases 09/15/4313   Complication of anesthesia 2007   quit breathing with bronchoscopy, had to spend night   History of agent Orange exposure 44-45 yrs ago   History of pneumonia  last 3-4 yrs ago   3 -4 different times   Hypercholesterolemia    Hypertension    Hypothyroidism    lung ca dx'd 2007   lung right   Sinus disease    hx of   Sleep apnea    uses cpap setting opf 12    ALLERGIES:  is allergic to lisinopril and prednisone.  MEDICATIONS:  Current Outpatient Medications   Medication Sig Dispense Refill   amitriptyline (ELAVIL) 50 MG tablet Take 50 mg by mouth at bedtime. Reported on 06/18/2015     amLODipine (NORVASC) 5 MG tablet Take 1 tablet (5 mg total) by mouth daily. 90 tablet 3   aspirin 81 MG tablet Take 81 mg by mouth every morning.      Cholecalciferol 2000 units CAPS Take 2,000 Units by mouth daily.      clindamycin (CLEOCIN T) 1 % external solution APPLY TO AFFECTED AREA(S) TWO TIMES A DAY AS NEEDED FOR SKIN IRRITATION 30 mL 0   erlotinib (TARCEVA) 100 MG tablet Take 1 tablet (100 mg total) by mouth daily. Take on an empty stomach 1 hour before meals or 2 hours after.faxed to [facility] 90 tablet 3   levETIRAcetam (KEPPRA) 750 MG tablet Take 750 mg by mouth at bedtime.      metoprolol succinate (TOPROL-XL) 25 MG 24 hr tablet Take two tablets (50 mg) by mouth every morning.  Take one tablet (25 mg) by mouth every evening.  Take with or immediately following a meal. 270 tablet 3   Multiple Vitamins-Minerals (CENTRUM SILVER PO) Take 1 tablet by mouth daily.      nitroGLYCERIN (NITROSTAT) 0.4 MG SL tablet PLACE ONE TABLET UNDER THE TONGUE EVERY 5 MINUTES AS NEEDED FOR CHEST PAIN 25 tablet 10   polyethylene glycol (MIRALAX / GLYCOLAX) packet Take 17 g by mouth  daily. Reported on 08/27/2015     psyllium (METAMUCIL) 58.6 % powder Take 1 packet by mouth daily. Reported on 08/27/2015     rosuvastatin (CRESTOR) 5 MG tablet Take 5 mg by mouth every morning.      SYNTHROID 112 MCG tablet Take 112 mcg by mouth daily.     valsartan-hydrochlorothiazide (DIOVAN-HCT) 320-25 MG per tablet Take 1 tablet by mouth every morning.      No current facility-administered medications for this visit.    SURGICAL HISTORY:  Past Surgical History:  Procedure Laterality Date   BIOPSY  05/21/2018   Procedure: BIOPSY;  Surgeon: Wonda Horner, MD;  Location: [facility];  Service: Endoscopy;;   COLONOSCOPY WITH PROPOFOL N/A 03/19/2013   Procedure: COLONOSCOPY WITH PROPOFOL;   Surgeon: Garlan Fair, MD;  Location: [facility];  Service: Endoscopy;  Laterality: N/A;   Cystourethroscopy, [facility] TURP.  04/16/2010   ESOPHAGOGASTRODUODENOSCOPY (EGD) WITH PROPOFOL N/A 05/21/2018   Procedure: ESOPHAGOGASTRODUODENOSCOPY (EGD) WITH PROPOFOL;  Surgeon: Wonda Horner, MD;  Location: [facility];  Service: Endoscopy;  Laterality: N/A;   NASAL SINUS SURGERY  1987   Right video-assisted thoracoscopy, right upper lobectomy and  01/23/2006   The Olympus video bronchoscope was introduced via the right  12/21/2005   TONSILLECTOMY  1957   Torn medial and lateral menisci, left knee.  11/06/2006    REVIEW OF SYSTEMS:  A comprehensive review of systems was negative except for: Respiratory: positive for pleurisy/chest pain Integument/breast: positive for rash   PHYSICAL EXAMINATION: General appearance: alert, cooperative, and no distress Head: Normocephalic, without obvious abnormality, atraumatic Neck: no adenopathy, no JVD, supple, symmetrical, trachea midline, and thyroid not enlarged, symmetric, no tenderness/mass/nodules Lymph nodes: Cervical, supraclavicular, and axillary nodes normal. Resp: clear to auscultation bilaterally Back: symmetric, no curvature. ROM normal. No CVA tenderness. Cardio: regular rate and rhythm, S1, S2 normal, no murmur, click, rub or gallop GI: soft, non-tender; bowel sounds normal; no masses,  no organomegaly Extremities: extremities normal, atraumatic, no cyanosis or edema  ECOG PERFORMANCE STATUS: 1 - Symptomatic but completely ambulatory  Blood pressure 140/72, pulse 60, temperature (!) 96.3 F (35.7 C), temperature source Tympanic, resp. rate 18, height '6\' 1"'  (1.854 m), weight 248 lb 8 oz (112.7 kg), SpO2 98 %.  LABORATORY DATA: Lab Results  Component Value Date   WBC 5.8 04/29/2021   HGB 13.6 04/29/2021   HCT 38.5 (L) 04/29/2021   MCV 98.5 04/29/2021   PLT 184 04/29/2021      Chemistry      Component Value Date/Time   NA 139  02/18/2021 0808   NA 141 03/11/2019 0959   NA 139 04/06/2017 0932   K 3.6 02/18/2021 0808   K 3.8 04/06/2017 0932   CL 103 02/18/2021 0808   CL 102 04/24/2012 0853   CO2 28 02/18/2021 0808   CO2 29 04/06/2017 0932   BUN 15 02/18/2021 0808   BUN 18 03/11/2019 0959   BUN 14.4 04/06/2017 0932   CREATININE 1.00 02/18/2021 0808   CREATININE 1.0 04/06/2017 0932      Component Value Date/Time   CALCIUM 9.2 02/18/2021 0808   CALCIUM 9.5 04/06/2017 0932   ALKPHOS 59 02/18/2021 0808   ALKPHOS 58 04/06/2017 0932   AST 32 02/18/2021 0808   AST 27 04/06/2017 0932   ALT 34 02/18/2021 0808   ALT 27 04/06/2017 0932   BILITOT 0.5 02/18/2021 0808   BILITOT 0.45 04/06/2017 0932       RADIOGRAPHIC STUDIES: No results  found.   ASSESSMENT AND PLAN:  This is a very pleasant 76 years old white male with recurrent non-small cell lung cancer, adenocarcinoma and positive EGFR mutation in exon 21. He is currently on Tarceva 100 mg by mouth daily status post around 81 months of treatment. He continues to tolerate his treatment with Tarceva fairly well with no concerning adverse effect except for mild skin rash. I recommended for him to continue his treatment as planned.  He has a new specialty pharmacy to deliver his medication and we will reach out to the pharmacist of [facility] to help the patient with this transition. For the skin rash, I will give him refill of clindamycin lotion. I will see the patient back for follow-up visit in 2 months for evaluation and repeat CT scan of the chest, abdomen and pelvis for restaging of his disease. The patient was advised to call immediately if he has any concerning symptoms in the interval. The patient voices understanding of current disease status and treatment options and is in agreement with the current care plan. All questions were answered. The patient knows to call the clinic with any problems, questions or concerns. We can certainly see the patient  much sooner if necessary.  Disclaimer: This note was dictated with voice recognition software. Similar sounding words can inadvertently be transcribed and may not be corrected upon review.

## 2021-05-03 ENCOUNTER — Encounter (HOSPITAL_COMMUNITY): Payer: Self-pay | Admitting: Gastroenterology

## 2021-05-05 ENCOUNTER — Other Ambulatory Visit (HOSPITAL_COMMUNITY): Payer: Self-pay

## 2021-05-06 ENCOUNTER — Other Ambulatory Visit (HOSPITAL_COMMUNITY): Payer: Self-pay

## 2021-05-06 ENCOUNTER — Telehealth: Payer: Self-pay

## 2021-05-06 NOTE — Telephone Encounter (Signed)
Oral Oncology Patient Advocate Encounter   Was successful in securing patient a $4000 grant from [employer] to provide copayment coverage for Tarceva.  This will keep the out of pocket expense at $0.        The billing information is as follows and has been shared with [place].   Member ID: [id number] Group ID: [id number] RxBin: [id number] PCN: [id number] Dates of Eligibility: 05/06/21 through 05/06/22  Fund name:  [employer].  [place] Patient [place] Phone [phone] Fax [phone] 05/06/2021 12:07 PM

## 2021-05-07 ENCOUNTER — Telehealth: Payer: Self-pay

## 2021-05-07 ENCOUNTER — Other Ambulatory Visit (HOSPITAL_COMMUNITY): Payer: Self-pay

## 2021-05-07 NOTE — Telephone Encounter (Signed)
Oral Oncology Patient Advocate Encounter  Was successful in securing patient a $6000 grant from [facility] to provide copayment coverage for Tarceva.  This will keep the out of pocket expense at $0.     [employer] ID: [id number]  The billing information is as follows and has been shared with [place]: [id number] PCN: [id number] Member ID: [id number] Group ID: [id number] Dates of Eligibility: 04/07/21 through 04/06/22  Fund:  [place] Patient [place] Phone [phone] Fax [phone] 05/07/2021 12:07 PM

## 2021-05-10 ENCOUNTER — Other Ambulatory Visit: Payer: Self-pay | Admitting: *Deleted

## 2021-05-10 ENCOUNTER — Other Ambulatory Visit (HOSPITAL_COMMUNITY): Payer: Self-pay

## 2021-05-10 DIAGNOSIS — C3431 Malignant neoplasm of lower lobe, right bronchus or lung: Secondary | ICD-10-CM

## 2021-05-10 MED ORDER — ERLOTINIB HCL 100 MG PO TABS
100.0000 mg | ORAL_TABLET | Freq: Every day | ORAL | 0 refills | Status: DC
  Filled 2021-05-10 – 2021-05-12 (×2): qty 30, 30d supply, fill #0

## 2021-05-11 ENCOUNTER — Ambulatory Visit (HOSPITAL_COMMUNITY): Payer: Medicare Other | Admitting: Anesthesiology

## 2021-05-11 ENCOUNTER — Ambulatory Visit (HOSPITAL_COMMUNITY)
Admission: RE | Admit: 2021-05-11 | Discharge: 2021-05-11 | Disposition: A | Discharge status: Home / Self Care | Payer: Medicare Other | Source: Ambulatory Visit | Attending: Gastroenterology | Admitting: Gastroenterology

## 2021-05-11 ENCOUNTER — Encounter (HOSPITAL_COMMUNITY): Payer: Self-pay | Admitting: Gastroenterology

## 2021-05-11 ENCOUNTER — Encounter (HOSPITAL_COMMUNITY)
Admission: RE | Disposition: A | Discharge status: Home / Self Care | Payer: Self-pay | Source: Ambulatory Visit | Attending: Gastroenterology

## 2021-05-11 ENCOUNTER — Other Ambulatory Visit (HOSPITAL_COMMUNITY): Payer: Self-pay

## 2021-05-11 ENCOUNTER — Other Ambulatory Visit: Payer: Self-pay

## 2021-05-11 DIAGNOSIS — Z85118 Personal history of other malignant neoplasm of bronchus and lung: Secondary | ICD-10-CM | POA: Diagnosis not present

## 2021-05-11 DIAGNOSIS — K644 Residual hemorrhoidal skin tags: Secondary | ICD-10-CM | POA: Diagnosis not present

## 2021-05-11 DIAGNOSIS — D124 Benign neoplasm of descending colon: Secondary | ICD-10-CM | POA: Diagnosis not present

## 2021-05-11 DIAGNOSIS — K635 Polyp of colon: Secondary | ICD-10-CM | POA: Diagnosis not present

## 2021-05-11 DIAGNOSIS — G4730 Sleep apnea, unspecified: Secondary | ICD-10-CM | POA: Insufficient documentation

## 2021-05-11 DIAGNOSIS — Z86010 Personal history of colonic polyps: Secondary | ICD-10-CM | POA: Diagnosis not present

## 2021-05-11 DIAGNOSIS — K648 Other hemorrhoids: Secondary | ICD-10-CM | POA: Diagnosis not present

## 2021-05-11 DIAGNOSIS — K621 Rectal polyp: Secondary | ICD-10-CM | POA: Diagnosis not present

## 2021-05-11 DIAGNOSIS — D122 Benign neoplasm of ascending colon: Secondary | ICD-10-CM | POA: Diagnosis not present

## 2021-05-11 DIAGNOSIS — Z1211 Encounter for screening for malignant neoplasm of colon: Secondary | ICD-10-CM | POA: Insufficient documentation

## 2021-05-11 DIAGNOSIS — K5730 Diverticulosis of large intestine without perforation or abscess without bleeding: Secondary | ICD-10-CM | POA: Diagnosis not present

## 2021-05-11 DIAGNOSIS — D125 Benign neoplasm of sigmoid colon: Secondary | ICD-10-CM | POA: Diagnosis not present

## 2021-05-11 HISTORY — PX: COLONOSCOPY WITH PROPOFOL: SHX5780

## 2021-05-11 HISTORY — PX: POLYPECTOMY: SHX5525

## 2021-05-11 HISTORY — PX: BIOPSY: SHX5522

## 2021-05-11 SURGERY — COLONOSCOPY WITH PROPOFOL
Anesthesia: Monitor Anesthesia Care

## 2021-05-11 MED ORDER — PROPOFOL 1000 MG/100ML IV EMUL
INTRAVENOUS | Status: AC
  Filled 2021-05-11: qty 200

## 2021-05-11 MED ORDER — LIDOCAINE 2% (20 MG/ML) 5 ML SYRINGE
INTRAMUSCULAR | Status: DC | PRN
  Administered 2021-05-11: 40 mg via INTRAVENOUS

## 2021-05-11 MED ORDER — PROPOFOL 1000 MG/100ML IV EMUL
INTRAVENOUS | Status: AC
  Filled 2021-05-11: qty 100

## 2021-05-11 MED ORDER — SODIUM CHLORIDE 0.9 % IV SOLN: INTRAVENOUS | Status: DC

## 2021-05-11 MED ORDER — LACTATED RINGERS IV SOLN: INTRAVENOUS | Status: DC

## 2021-05-11 MED ORDER — PROPOFOL 500 MG/50ML IV EMUL
INTRAVENOUS | Status: AC
  Filled 2021-05-11: qty 50

## 2021-05-11 MED ORDER — PROPOFOL 500 MG/50ML IV EMUL
INTRAVENOUS | Status: DC | PRN
  Administered 2021-05-11: 125 ug/kg/min via INTRAVENOUS

## 2021-05-11 MED ORDER — PROPOFOL 10 MG/ML IV BOLUS
INTRAVENOUS | Status: DC | PRN
Start: 2021-05-11 — End: 2021-05-11
  Administered 2021-05-11 (×2): 20 mg via INTRAVENOUS

## 2021-05-11 SURGICAL SUPPLY — 22 items

## 2021-05-11 NOTE — Anesthesia Postprocedure Evaluation (Signed)
Anesthesia Post Note  Patient: [name]  Procedure(s) Performed: COLONOSCOPY WITH PROPOFOL POLYPECTOMY     Patient location during evaluation: PACU Anesthesia Type: MAC Level of consciousness: awake and alert Pain management: pain level controlled Vital Signs Assessment: post-procedure vital signs reviewed and stable Respiratory status: spontaneous breathing, nonlabored ventilation, respiratory function stable and patient connected to nasal cannula oxygen Cardiovascular status: stable and blood pressure returned to baseline Postop Assessment: no apparent nausea or vomiting Anesthetic complications: no   No notable events documented.  Last Vitals:  Vitals:   05/11/21 1020 05/11/21 1030  BP: 105/63 123/73  Pulse: 68 63  Resp: 17 18  Temp:    SpO2: 92% 93%    Last Pain:  Vitals:   05/11/21 1030  TempSrc:   PainSc: 0-No pain                 Tiajuana Amass

## 2021-05-11 NOTE — H&P (Signed)
Primary Care Physician:  [name], MD Primary Gastroenterologist:  [facility]  Reason for Visit : Surveillance colonoscopy  HPI: [name] is a 76 y.o. male with past medical history of lung cancer s/p right lung partial lobectomy, history of sleep apnea and personal history of adenomatous polyp is here for outpatient surveillance colonoscopy.  Last colonoscopy in 2014 showed tubular adenoma and repeat was recommended in 5 years.  Looks like patient underwent virtual colonoscopy in 2021 which showed suboptimal prep but otherwise no polyps were found.  Initial plan was to have no repeat colonoscopy but looks like patient discussed with his primary care physician they recommended to have repeat colonoscopy done for personal history of adenomatous polyps.  Patient denies any GI symptoms.  Denies any acute respiratory symptoms  Past Medical History:  Diagnosis Date   Cholelithiases 08/29/7822   Complication of anesthesia 2007   quit breathing with bronchoscopy, had to spend night   History of agent Orange exposure 44-45 yrs ago   History of pneumonia  last 3-4 yrs ago   3 -4 different times   Hypercholesterolemia    Hypertension    Hypothyroidism    lung ca dx'd 2007   lung right   Sinus disease    hx of   Sleep apnea    uses cpap setting opf 12    Past Surgical History:  Procedure Laterality Date   BIOPSY  05/21/2018   Procedure: BIOPSY;  Surgeon: Wonda Horner, MD;  Location: [facility];  Service: Endoscopy;;   COLONOSCOPY WITH PROPOFOL N/A 03/19/2013   Procedure: COLONOSCOPY WITH PROPOFOL;  Surgeon: Garlan Fair, MD;  Location: [facility];  Service: Endoscopy;  Laterality: N/A;   Cystourethroscopy, [facility] T[facility].  04/16/2010   ESOPHAGOGASTRODUODENOSCOPY (EGD) WITH PROPOFOL N/A 05/21/2018   Procedure: ESOPHAGOGASTRODUODENOSCOPY (EGD) WITH PROPOFOL;  Surgeon: Wonda Horner, MD;  Location: [facility];  Service: Endoscopy;  Laterality: N/A;   NASAL SINUS SURGERY   1987   Right video-assisted thoracoscopy, right upper lobectomy and  01/23/2006   The Olympus video bronchoscope was introduced via the right  12/21/2005   TONSILLECTOMY  1957   Torn medial and lateral menisci, left knee.  11/06/2006    Prior to Admission medications   Medication Sig Start Date End Date Taking? Authorizing Provider  amitriptyline (ELAVIL) 50 MG tablet Take 50 mg by mouth at bedtime. Reported on 06/18/2015 04/04/1993  Yes [provider]  amLODipine (NORVASC) 5 MG tablet Take 1 tablet (5 mg total) by mouth daily. 10/23/20  Yes Belva Crome, MD  aspirin 81 MG tablet Take 81 mg by mouth every morning.  10/03/1983  Yes [provider]  Cholecalciferol 2000 units CAPS Take 2,000 Units by mouth daily.  08/13/14  Yes [name], MD  clindamycin (CLEOCIN T) 1 % external solution APPLY TO AFFECTED AREA(S) TWO TIMES A DAY AS NEEDED FOR SKIN IRRITATION 04/29/21  Yes Curt Bears, MD  erlotinib (TARCEVA) 100 MG tablet Take 1 tablet (100 mg total) by mouth daily. Take on an empty stomach 1 hour before meals or 2 hours after 05/10/21 06/09/21 Yes Mohamed, [name], MD  levETIRAcetam (KEPPRA) 750 MG tablet Take 750 mg by mouth at bedtime.  08/12/14  Yes [provider]  metoprolol succinate (TOPROL-XL) 25 MG 24 hr tablet Take two tablets (50 mg) by mouth every morning.  Take one tablet (25 mg) by mouth every evening.  Take with or immediately following a meal. 11/24/20  Yes Belva Crome,  MD  Multiple Vitamins-Minerals (CENTRUM SILVER PO) Take 1 tablet by mouth daily.  04/05/03  Yes [provider]  nitroGLYCERIN (NITROSTAT) 0.4 MG SL tablet PLACE ONE TABLET UNDER THE TONGUE EVERY 5 MINUTES AS NEEDED FOR CHEST PAIN 08/02/19  Yes Belva Crome, MD  polyethylene glycol [facility] Floria Raveling) packet Take 17 g by mouth daily. Reported on 08/27/2015   Yes [provider]  psyllium (METAMUCIL) 58.6 % powder Take 1 packet by mouth daily. Reported on 08/27/2015   Yes  [provider]  rosuvastatin (CRESTOR) 5 MG tablet Take 5 mg by mouth every morning.  05/05/10  Yes [provider]  SYNTHROID 112 MCG tablet Take 112 mcg by mouth daily. 03/07/16  Yes [provider]  valsartan-hydrochlorothiazide (DIOVAN-HCT) 320-25 MG per tablet Take 1 tablet by mouth every morning.  05/05/10  Yes [provider]    Scheduled Meds: Continuous Infusions:  sodium chloride     lactated ringers 10 mL/hr at 05/11/21 0839   PRN Meds:.  Allergies as of 03/30/2021 - Review Complete 01/05/2021  Allergen Reaction Noted   Lisinopril Cough 05/01/2012   Prednisone Rash 06/08/2016    Family History  Problem Relation Age of Onset   Hypertension Mother    Varicose Veins Mother    Heart attack Father    Heart attack Brother    Varicose Veins Sister     Social History   Socioeconomic History   Marital status: Married    Spouse name: Not on file   Number of children: 0   Years of education: Not on file   Highest education level: Not on file  Occupational History   Occupation: Administrator    Comment: Retired  Tobacco Use   Smoking status: Never   Smokeless tobacco: Never  Vaping Use   Vaping Use: Never used  Substance and Sexual Activity   Alcohol use: No    Alcohol/week: 0.0 standard drinks   Drug use: No   Sexual activity: Not Currently  Other Topics Concern   Not on file  Social History Narrative   Not on file   Social Determinants of Health   Financial Resource Strain: Not on file  Food Insecurity: Not on file  Transportation Needs: Not on file  Physical Activity: Not on file  Stress: Not on file  Social Connections: Not on file  Intimate Partner Violence: Not on file    Review of Systems: All negative except as stated above in HPI.  Physical Exam: Vital signs: Vitals:   05/11/21 0834  BP: (!) 160/86  Pulse: 89  Resp: 10  Temp: 98.2 F (36.8 C)  SpO2: 97%     General:   Alert,  Well-developed,  well-nourished, pleasant and cooperative in NAD Lungs: No visible respiratory distress, anterior exam only Heart:  Regular rate and rhythm; no murmurs, clicks, rubs,  or gallops. Abdomen: Soft, nontender, nondistended, bowel sounds present.  No peritoneal signs Rectal:  Deferred  GI:  Lab Results: No results for input(s): WBC, HGB, HCT, PLT in the last 72 hours. BMET No results for input(s): NA, K, CL, CO2, GLUCOSE, BUN, CREATININE, CALCIUM in the last 72 hours. LFT No results for input(s): PROT, ALBUMIN, AST, ALT, ALKPHOS, BILITOT, BILIDIR, IBILI in the last 72 hours. PT/INR No results for input(s): LABPROT, INR in the last 72 hours.   Studies/Results: No results found.  Impression/Plan: -History of adenomatous polyp  Recommendations ------------------------- -Proceed with colonoscopy today.  Risks (bleeding, infection, bowel perforation that  could require surgery, sedation-related changes in cardiopulmonary systems), benefits (identification and possible treatment of source of symptoms, exclusion of certain causes of symptoms), and alternatives (watchful waiting, radiographic imaging studies, empiric medical treatment)  were explained to patient/family in detail and patient wishes to proceed.     LOS: 0 days   Otis Brace  MD, FACP 05/11/2021, 9:02 AM  Contact #  [phone]

## 2021-05-11 NOTE — Anesthesia Preprocedure Evaluation (Signed)
Anesthesia Evaluation  Patient identified by MRN, date of birth, ID band Patient awake    Reviewed: Allergy & Precautions, NPO status , Patient's Chart, lab work & pertinent test results  Airway Mallampati: II  TM Distance: >3 FB Neck ROM: Full    Dental  (+) Dental Advisory Given   Pulmonary sleep apnea ,    breath sounds clear to auscultation       Cardiovascular hypertension, Pt. on medications and Pt. on home beta blockers  Rhythm:Regular Rate:Normal     Neuro/Psych negative neurological ROS     GI/Hepatic negative GI ROS, Neg liver ROS,   Endo/Other  Hypothyroidism   Renal/GU negative Renal ROS     Musculoskeletal   Abdominal   Peds  Hematology negative hematology ROS (+)   Anesthesia Other Findings   Reproductive/Obstetrics                             Anesthesia Physical Anesthesia Plan  ASA: 3  Anesthesia Plan: MAC   Post-op Pain Management: Minimal or no pain anticipated   Induction:   PONV Risk Score and Plan: 1 and Propofol infusion and Treatment may vary due to age or medical condition  Airway Management Planned: Natural Airway and Simple Face Mask  Additional Equipment: None  Intra-op Plan:   Post-operative Plan:   Informed Consent: I have reviewed the patients History and Physical, chart, labs and discussed the procedure including the risks, benefits and alternatives for the proposed anesthesia with the patient or authorized representative who has indicated his/her understanding and acceptance.       Plan Discussed with: CRNA  Anesthesia Plan Comments:         Anesthesia Quick Evaluation

## 2021-05-11 NOTE — Discharge Instructions (Addendum)

## 2021-05-11 NOTE — Anesthesia Procedure Notes (Signed)
Procedure Name: MAC Date/Time: 05/11/2021 9:16 AM Performed by: Cynda Familia, CRNA Pre-anesthesia Checklist: Patient identified, Emergency Drugs available, Suction available, Patient being monitored and Timeout performed Oxygen Delivery Method: Simple face mask Placement Confirmation: positive ETCO2 and breath sounds checked- equal and bilateral Dental Injury: Teeth and Oropharynx as per pre-operative assessment

## 2021-05-11 NOTE — Transfer of Care (Signed)
Immediate Anesthesia Transfer of Care Note  Patient: [name]  Procedure(s) Performed: COLONOSCOPY WITH PROPOFOL POLYPECTOMY  Patient Location: PACU  Anesthesia Type:MAC  Level of Consciousness: sedated, patient cooperative and responds to stimulation  Airway & Oxygen Therapy: Patient Spontanous Breathing and Patient connected to face mask oxygen  Post-op Assessment: Report given to RN and Post -op Vital signs reviewed and stable  Post vital signs: Reviewed and stable  Last Vitals:  Vitals Value Taken Time  BP    Temp    Pulse    Resp    SpO2      Last Pain:  Vitals:   05/11/21 0834  TempSrc: Temporal  PainSc: 0-No pain         Complications: No notable events documented.

## 2021-05-11 NOTE — Op Note (Signed)
[facility] Patient Name: [name] Procedure Date: 05/11/2021 MRN: [id number] Attending MD: Otis Brace , MD Date of Birth: 16-Apr-1945 CSN: [id number] Age: 76 Admit Type: Outpatient Procedure:                Colonoscopy Indications:              High risk colon cancer surveillance: Personal                            history of non-advanced adenoma, Last colonoscopy:                            2014 Providers:                Otis Brace, MD, Dulcy Fanny, Frazier Richards, Technician Referring MD:              Medicines:                Sedation Administered by an Anesthesia Professional Complications:            No immediate complications. Estimated Blood Loss:     Estimated blood loss was minimal. Procedure:                Pre-Anesthesia Assessment:                           - Prior to the procedure, a History and Physical                            was performed, and patient medications and                            allergies were reviewed. The patient's tolerance of                            previous anesthesia was also reviewed. The risks                            and benefits of the procedure and the sedation                            options and risks were discussed with the patient.                            All questions were answered, and informed consent                            was obtained. Prior Anticoagulants: The patient has                            taken no previous anticoagulant or antiplatelet                            agents except for aspirin. ASA  Grade Assessment:                            III - A patient with severe systemic disease. After                            reviewing the risks and benefits, the patient was                            deemed in satisfactory condition to undergo the                            procedure.                           After obtaining informed consent, the  colonoscope                            was passed under direct vision. Throughout the                            procedure, the patient's blood pressure, pulse, and                            oxygen saturations were monitored continuously. The                            [id number]-HQ190L ([id number]) Olympus colonoscope was                            introduced through the anus and advanced to the the                            cecum, identified by appendiceal orifice and                            ileocecal valve. The colonoscopy was performed with                            moderate difficulty due to significant looping.                            Successful completion of the procedure was aided by                            changing the patient to a supine position and                            applying abdominal pressure. The patient tolerated                            the procedure well. The quality of the bowel  preparation was good except the transverse colon                            was fair. Scope In: 9:24:58 AM Scope Out: 9:58:50 AM Scope Withdrawal Time: 0 hours 9 minutes 32 seconds  Total Procedure Duration: 0 hours 33 minutes 52 seconds  Findings:      Skin tags were found on perianal exam.      A 7 mm polyp was found in the ascending colon. The polyp was sessile.       The polyp was removed with a cold snare. Resection and retrieval were       complete.      Two sessile polyps were found in the descending colon. The polyps were 2       to 3 mm in size. These polyps were removed with a cold biopsy forceps.       Resection and retrieval were complete.      A 6 mm polyp was found in the sigmoid colon. The polyp was sessile. The       polyp was removed with a cold snare. Resection and retrieval were       complete.      Scattered diverticula were found in the entire colon.      A few hyperplastic polyps were found in the rectum. The polyps were        diminutive in size. Biopsies were taken with a cold forceps for       histology.      Internal hemorrhoids were found during retroflexion. The hemorrhoids       were large. Impression:               - Preparation of the colon was fair.                           - Perianal skin tags found on perianal exam.                           - One 7 mm polyp in the ascending colon, removed                            with a cold snare. Resected and retrieved.                           - Two 2 to 3 mm polyps in the descending colon,                            removed with a cold biopsy forceps. Resected and                            retrieved.                           - One 6 mm polyp in the sigmoid colon, removed with                            a cold snare. Resected and retrieved.                           -  Diverticulosis in the entire examined colon.                           - A few diminutive polyps in the rectum. Biopsied.                           - Internal hemorrhoids. Moderate Sedation:      Moderate (conscious) sedation was personally administered by an       anesthesia professional. The following parameters were monitored: oxygen       saturation, heart rate, blood pressure, and response to care. Recommendation:           - Patient has a contact number available for                            emergencies. The signs and symptoms of potential                            delayed complications were discussed with the                            patient. Return to normal activities tomorrow.                            Written discharge instructions were provided to the                            patient.                           - Resume previous diet.                           - Continue present medications.                           - No repeat colonoscopy due to current age (79                            years or older).                           - Return to my office PRN. Procedure  Code(s):        --- Professional ---                           [phone], Colonoscopy, flexible; with removal of                            tumor(s), polyp(s), or other lesion(s) by snare                            technique                           40347, 29, Colonoscopy, flexible; with biopsy,  single or multiple Diagnosis Code(s):        --- Professional ---                           Z86.010, Personal history of colonic polyps                           K64.8, Other hemorrhoids                           K63.5, Polyp of colon                           K62.1, Rectal polyp                           K64.4, Residual hemorrhoidal skin tags                           K57.30, Diverticulosis of large intestine without                            perforation or abscess without bleeding CPT copyright 2019 [employer]. All rights reserved. The codes documented in this report are preliminary and upon coder review may  be revised to meet current compliance requirements. Otis Brace, MD Otis Brace, MD 05/11/2021 10:08:35 AM Number of Addenda: 0

## 2021-05-12 ENCOUNTER — Other Ambulatory Visit (HOSPITAL_COMMUNITY): Payer: Self-pay

## 2021-05-12 ENCOUNTER — Encounter (HOSPITAL_COMMUNITY): Payer: Self-pay | Admitting: Gastroenterology

## 2021-05-12 LAB — SURGICAL PATHOLOGY

## 2021-05-27 ENCOUNTER — Other Ambulatory Visit (HOSPITAL_COMMUNITY): Payer: Self-pay

## 2021-06-02 ENCOUNTER — Other Ambulatory Visit: Payer: Self-pay | Admitting: Internal Medicine

## 2021-06-02 ENCOUNTER — Other Ambulatory Visit (HOSPITAL_COMMUNITY): Payer: Self-pay

## 2021-06-02 DIAGNOSIS — C3431 Malignant neoplasm of lower lobe, right bronchus or lung: Secondary | ICD-10-CM

## 2021-06-02 MED ORDER — ERLOTINIB HCL 100 MG PO TABS
100.0000 mg | ORAL_TABLET | Freq: Every day | ORAL | 0 refills | Status: DC
  Filled 2021-06-02: qty 30, 30d supply, fill #0

## 2021-06-03 ENCOUNTER — Other Ambulatory Visit (HOSPITAL_COMMUNITY): Payer: Self-pay

## 2021-06-08 ENCOUNTER — Other Ambulatory Visit (HOSPITAL_COMMUNITY): Payer: Self-pay

## 2021-06-21 ENCOUNTER — Inpatient Hospital Stay: Payer: Medicare Other | Attending: Internal Medicine

## 2021-06-21 ENCOUNTER — Other Ambulatory Visit: Payer: Self-pay

## 2021-06-21 DIAGNOSIS — R21 Rash and other nonspecific skin eruption: Secondary | ICD-10-CM | POA: Diagnosis not present

## 2021-06-21 DIAGNOSIS — Z79899 Other long term (current) drug therapy: Secondary | ICD-10-CM | POA: Insufficient documentation

## 2021-06-21 DIAGNOSIS — C3431 Malignant neoplasm of lower lobe, right bronchus or lung: Secondary | ICD-10-CM | POA: Insufficient documentation

## 2021-06-21 DIAGNOSIS — C3490 Malignant neoplasm of unspecified part of unspecified bronchus or lung: Secondary | ICD-10-CM

## 2021-06-21 LAB — CMP (CANCER CENTER ONLY)
ALT: 30 U/L (ref 0–44)
AST: 32 U/L (ref 15–41)
Albumin: 4.2 g/dL (ref 3.5–5.0)
Alkaline Phosphatase: 51 U/L (ref 38–126)
Anion gap: 5 (ref 5–15)
BUN: 14 mg/dL (ref 8–23)
CO2: 31 mmol/L (ref 22–32)
Calcium: 9.7 mg/dL (ref 8.9–10.3)
Chloride: 102 mmol/L (ref 98–111)
Creatinine: 1 mg/dL (ref 0.61–1.24)
GFR, Estimated: >60 mL/min (ref >60)
Glucose, Bld: 106 mg/dL — ABNORMAL HIGH (ref 70–99)
Potassium: 3.9 mmol/L (ref 3.5–5.1)
Sodium: 138 mmol/L (ref 135–145)
Total Bilirubin: 0.6 mg/dL (ref 0.3–1.2)
Total Protein: 6.6 g/dL (ref 6.5–8.1)

## 2021-06-21 LAB — CBC WITH DIFFERENTIAL (CANCER CENTER ONLY)
Abs Immature Granulocytes: 0.02 10*3/uL (ref 0.00–0.07)
Basophils Absolute: 0 10*3/uL (ref 0.0–0.1)
Basophils Relative: 1 %
Eosinophils Absolute: 0.3 10*3/uL (ref 0.0–0.5)
Eosinophils Relative: 5 %
HCT: 37.5 % — ABNORMAL LOW (ref 39.0–52.0)
Hemoglobin: 13.2 g/dL (ref 13.0–17.0)
Immature Granulocytes: 0 %
Lymphocytes Relative: 46 %
Lymphs Abs: 2.9 10*3/uL (ref 0.7–4.0)
MCH: 34.8 pg — ABNORMAL HIGH (ref 26.0–34.0)
MCHC: 35.2 g/dL (ref 30.0–36.0)
MCV: 98.9 fL (ref 80.0–100.0)
Monocytes Absolute: 0.7 10*3/uL (ref 0.1–1.0)
Monocytes Relative: 11 %
Neutro Abs: 2.3 10*3/uL (ref 1.7–7.7)
Neutrophils Relative %: 37 %
Platelet Count: 192 10*3/uL (ref 150–400)
RBC: 3.79 MIL/uL — ABNORMAL LOW (ref 4.22–5.81)
RDW: 12.2 % (ref 11.5–15.5)
WBC Count: 6.2 10*3/uL (ref 4.0–10.5)
nRBC: 0 % (ref 0.0–0.2)

## 2021-06-22 ENCOUNTER — Encounter (HOSPITAL_COMMUNITY): Payer: Self-pay

## 2021-06-22 ENCOUNTER — Ambulatory Visit (HOSPITAL_COMMUNITY)
Admission: RE | Admit: 2021-06-22 | Discharge: 2021-06-22 | Disposition: A | Discharge status: Home / Self Care | Payer: Medicare Other | Source: Ambulatory Visit | Attending: Internal Medicine | Admitting: Internal Medicine

## 2021-06-22 DIAGNOSIS — K4090 Unilateral inguinal hernia, without obstruction or gangrene, not specified as recurrent: Secondary | ICD-10-CM | POA: Diagnosis not present

## 2021-06-22 DIAGNOSIS — C3490 Malignant neoplasm of unspecified part of unspecified bronchus or lung: Secondary | ICD-10-CM | POA: Diagnosis not present

## 2021-06-22 DIAGNOSIS — N281 Cyst of kidney, acquired: Secondary | ICD-10-CM | POA: Diagnosis not present

## 2021-06-22 DIAGNOSIS — K8020 Calculus of gallbladder without cholecystitis without obstruction: Secondary | ICD-10-CM | POA: Diagnosis not present

## 2021-06-22 DIAGNOSIS — R918 Other nonspecific abnormal finding of lung field: Secondary | ICD-10-CM | POA: Diagnosis not present

## 2021-06-22 DIAGNOSIS — K5730 Diverticulosis of large intestine without perforation or abscess without bleeding: Secondary | ICD-10-CM | POA: Diagnosis not present

## 2021-06-22 MED ORDER — SODIUM CHLORIDE (PF) 0.9 % IJ SOLN
INTRAMUSCULAR | Status: AC
  Filled 2021-06-22: qty 50

## 2021-06-22 MED ORDER — IOHEXOL 300 MG/ML  SOLN
100.0000 mL | Freq: Once | INTRAMUSCULAR | Status: AC | PRN
  Administered 2021-06-22: 100 mL via INTRAVENOUS

## 2021-06-24 ENCOUNTER — Other Ambulatory Visit: Payer: Self-pay

## 2021-06-24 ENCOUNTER — Inpatient Hospital Stay (HOSPITAL_BASED_OUTPATIENT_CLINIC_OR_DEPARTMENT_OTHER): Payer: Medicare Other | Admitting: Internal Medicine

## 2021-06-24 VITALS — BP 132/75 | HR 66 | Temp 96.8°F | Resp 19 | Ht 72.0 in | Wt 248.1 lb

## 2021-06-24 DIAGNOSIS — R21 Rash and other nonspecific skin eruption: Secondary | ICD-10-CM | POA: Diagnosis not present

## 2021-06-24 DIAGNOSIS — Z79899 Other long term (current) drug therapy: Secondary | ICD-10-CM | POA: Diagnosis not present

## 2021-06-24 DIAGNOSIS — Z5111 Encounter for antineoplastic chemotherapy: Secondary | ICD-10-CM | POA: Diagnosis not present

## 2021-06-24 DIAGNOSIS — C3431 Malignant neoplasm of lower lobe, right bronchus or lung: Secondary | ICD-10-CM

## 2021-06-24 NOTE — Progress Notes (Signed)
?    [facility] ?[phone]   [phone] ? ?OFFICE PROGRESS NOTE ? ?[name], MD ?[address] ?[place] [address] ? 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 ? ?PRIOR THERAPY: ?1) Status post right upper lobectomy under the care of Dr. Roxan Hockey on 01/23/2006.  ?2) Tarceva 150 mg by mouth daily as part of the BMS checkmate 370 clinical trial. Status post 6 weeks of treatment. ? ?CURRENT THERAPY: Tarceva 100 mg by mouth daily as part of the BMS Checkmate 370 clinical trial. First dose started 07/17/2014 status post 83 months of treatment. ? ?INTERVAL HISTORY: ?[name] 76 y.o. male returns to the clinic today for follow-up visit.  The patient is feeling fine today with no concerning complaints.  He denied having any chest pain, shortness of breath, cough or hemoptysis.  He has no nausea, vomiting, diarrhea or constipation.  He has no headache or visual changes.  He denied having any recent weight loss or night sweats.  He continues to tolerate his treatment with Tarceva fairly well.  The patient is here today for evaluation and repeat CT scan of the chest, abdomen and pelvis for restaging of his disease. ? ? ?MEDICAL HISTORY: ?Past Medical History:  ?Diagnosis Date  ? Cholelithiases 07/16/2015  ? Complication of anesthesia 2007  ? quit breathing with bronchoscopy, had to spend night  ? History of agent Orange exposure 44-45 yrs ago  ? History of pneumonia  last 3-4 yrs ago  ? 3 -4 different times  ? Hypercholesterolemia   ? Hypertension   ? Hypothyroidism   ? lung ca dx'd 2007  ? lung right  ? Sinus disease   ? hx of  ? Sleep apnea   ? uses cpap setting opf 12  ? ? ?ALLERGIES:  is allergic to lisinopril and prednisone. ? ?MEDICATIONS:  ?Current Outpatient Medications  ?Medication Sig Dispense Refill  ?  amitriptyline (ELAVIL) 50 MG tablet Take 50 mg by mouth at bedtime. Reported on 06/18/2015    ? amLODipine (NORVASC) 5 MG tablet Take 1 tablet (5 mg total) by mouth daily. 90 tablet 3  ? aspirin 81 MG tablet Take 81 mg by mouth every morning.     ? Cholecalciferol 2000 units CAPS Take 2,000 Units by mouth daily.     ? clindamycin (CLEOCIN T) 1 % external solution APPLY TO AFFECTED AREA(S) TWO TIMES A DAY AS NEEDED FOR SKIN IRRITATION 30 mL 0  ? erlotinib (TARCEVA) 100 MG tablet Take 1 tablet (100 mg total) by mouth daily. Take on an empty stomach 1 hour before meals or 2 hours after 30 tablet 0  ? levETIRAcetam (KEPPRA) 750 MG tablet Take 750 mg by mouth at bedtime.     ? metoprolol succinate (TOPROL-XL) 25 MG 24 hr tablet Take two tablets (50 mg) by mouth every morning.  Take one tablet (25 mg) by mouth every evening.  Take with or immediately following a meal. 270 tablet 3  ? Multiple Vitamins-Minerals (CENTRUM SILVER PO) Take 1 tablet by mouth daily.     ? nitroGLYCERIN (NITROSTAT) 0.4 MG SL tablet PLACE ONE TABLET UNDER THE TONGUE EVERY 5 MINUTES AS NEEDED FOR CHEST PAIN 25 tablet 10  ? polyethylene glycol (MIRALAX / GLYCOLAX) packet Take 17 g by mouth daily. Reported on 08/27/2015    ? psyllium (METAMUCIL) 58.6 %  powder Take 1 packet by mouth daily. Reported on 08/27/2015    ? rosuvastatin (CRESTOR) 5 MG tablet Take 5 mg by mouth every morning.     ? SYNTHROID 112 MCG tablet Take 112 mcg by mouth daily.    ? valsartan-hydrochlorothiazide (DIOVAN-HCT) 320-25 MG per tablet Take 1 tablet by mouth every morning.     ? ?No current facility-administered medications for this visit.  ? ? ?SURGICAL HISTORY:  ?Past Surgical History:  ?Procedure Laterality Date  ? BIOPSY  05/21/2018  ? Procedure: BIOPSY;  Surgeon: Wonda Horner, MD;  Location: [facility];  Service: Endoscopy;;  ? BIOPSY  05/11/2021  ? Procedure: BIOPSY;  Surgeon: Otis Brace, MD;  Location: [facility];  Service: Gastroenterology;;  ? COLONOSCOPY WITH  PROPOFOL N/A 03/19/2013  ? Procedure: COLONOSCOPY WITH PROPOFOL;  Surgeon: Garlan Fair, MD;  Location: [facility];  Service: Endoscopy;  Laterality: N/A;  ? COLONOSCOPY WITH PROPOFOL N/A 05/11/2021  ? Procedure: COLONOSCOPY WITH PROPOFOL;  Surgeon: Otis Brace, MD;  Location: [facility];  Service: Gastroenterology;  Laterality: N/A;  ? Cystourethroscopy, [facility] TURP.  04/16/2010  ? ESOPHAGOGASTRODUODENOSCOPY (EGD) WITH PROPOFOL N/A 05/21/2018  ? Procedure: ESOPHAGOGASTRODUODENOSCOPY (EGD) WITH PROPOFOL;  Surgeon: Wonda Horner, MD;  Location: [facility];  Service: Endoscopy;  Laterality: N/A;  ? NASAL SINUS SURGERY  1987  ? POLYPECTOMY  05/11/2021  ? Procedure: POLYPECTOMY;  Surgeon: Otis Brace, MD;  Location: [facility];  Service: Gastroenterology;;  ? Right video-assisted thoracoscopy, right upper lobectomy and  01/23/2006  ? The Olympus video bronchoscope was introduced via the right  12/21/2005  ? TONSILLECTOMY  1957  ? Torn medial and lateral menisci, left knee.  11/06/2006  ? ? ?REVIEW OF SYSTEMS:  Constitutional: negative ?Eyes: negative ?Ears, nose, mouth, throat, and face: negative ?Respiratory: negative ?Cardiovascular: negative ?Gastrointestinal: negative ?Genitourinary:negative ?Integument/breast: negative ?Hematologic/lymphatic: negative ?Musculoskeletal:negative ?Neurological: negative ?Behavioral/Psych: negative ?Endocrine: negative ?Allergic/Immunologic: negative  ? ?PHYSICAL EXAMINATION: General appearance: alert, cooperative, and no distress ?Head: Normocephalic, without obvious abnormality, atraumatic ?Neck: no adenopathy, no JVD, supple, symmetrical, trachea midline, and thyroid not enlarged, symmetric, no tenderness/mass/nodules ?Lymph nodes: Cervical, supraclavicular, and axillary nodes normal. ?Resp: clear to auscultation bilaterally ?Back: symmetric, no curvature. ROM normal. No CVA tenderness. ?Cardio: regular rate and rhythm, S1, S2 normal, no murmur, click, rub or  gallop ?GI: soft, non-tender; bowel sounds normal; no masses,  no organomegaly ?Extremities: extremities normal, atraumatic, no cyanosis or edema ?Neurologic: Alert and oriented X 3, normal strength and tone. Normal symmetric reflexes. Normal coordination and gait ? ?ECOG PERFORMANCE STATUS: 0 - Asymptomatic ? ?Blood pressure 132/75, pulse 66, temperature (!) 96.8 ?F (36 ?C), temperature source Tympanic, resp. rate 19, height 6' (1.829 m), weight 248 lb 1.6 oz (112.5 kg), SpO2 97 %. ? ?LABORATORY DATA: ?Lab Results  ?Component Value Date  ? WBC 6.2 06/21/2021  ? HGB 13.2 06/21/2021  ? HCT 37.5 (L) 06/21/2021  ? MCV 98.9 06/21/2021  ? PLT 192 06/21/2021  ? ? ?  Chemistry   ?   ?Component Value Date/Time  ? NA 138 06/21/2021 0841  ? NA 141 03/11/2019 0959  ? NA 139 04/06/2017 0932  ? K 3.9 06/21/2021 0841  ? K 3.8 04/06/2017 0932  ? CL 102 06/21/2021 0841  ? CL 102 04/24/2012 0853  ? CO2 31 06/21/2021 0841  ? CO2 29 04/06/2017 0932  ? BUN 14 06/21/2021 0841  ? BUN 18 03/11/2019 0959  ? BUN 14.4 04/06/2017 0932  ? CREATININE 1.00 06/21/2021 0841  ?  CREATININE 1.0 04/06/2017 0932  ?    ?Component Value Date/Time  ? CALCIUM 9.7 06/21/2021 0841  ? CALCIUM 9.5 04/06/2017 0932  ? ALKPHOS 51 06/21/2021 0841  ? ALKPHOS 58 04/06/2017 0932  ? AST 32 06/21/2021 0841  ? AST 27 04/06/2017 0932  ? ALT 30 06/21/2021 0841  ? ALT 27 04/06/2017 0932  ? BILITOT 0.6 06/21/2021 0841  ? BILITOT 0.45 04/06/2017 0932  ?  ? ? ? ?RADIOGRAPHIC STUDIES: ?CT Chest W Contrast ? ?Result Date: 06/22/2021 ?CLINICAL DATA:  Non-small cell lung cancer restaging, status post right upper lobectomy, ongoing chemotherapy * Tracking Code: BO * EXAM: CT CHEST, ABDOMEN, AND PELVIS WITH CONTRAST TECHNIQUE: Multidetector CT imaging of the chest, abdomen and pelvis was performed following the standard protocol during bolus administration of intravenous contrast. RADIATION DOSE REDUCTION: This exam was performed according to the departmental dose-optimization  program which includes automated exposure control, adjustment of the mA and/or kV according to patient size and/or use of iterative reconstruction technique. CONTRAST:  127m OMNIPAQUE IOHEXOL 300 MG/ML SOLN, additional

## 2021-06-29 ENCOUNTER — Other Ambulatory Visit: Payer: Self-pay | Admitting: Internal Medicine

## 2021-06-29 ENCOUNTER — Other Ambulatory Visit (HOSPITAL_COMMUNITY): Payer: Self-pay

## 2021-06-29 DIAGNOSIS — C3431 Malignant neoplasm of lower lobe, right bronchus or lung: Secondary | ICD-10-CM

## 2021-06-29 MED ORDER — ERLOTINIB HCL 100 MG PO TABS
100.0000 mg | ORAL_TABLET | Freq: Every day | ORAL | 0 refills | Status: DC
  Filled 2021-07-01: qty 30, 30d supply, fill #0

## 2021-07-01 ENCOUNTER — Other Ambulatory Visit (HOSPITAL_COMMUNITY): Payer: Self-pay

## 2021-07-07 ENCOUNTER — Other Ambulatory Visit (HOSPITAL_COMMUNITY): Payer: Self-pay

## 2021-07-27 ENCOUNTER — Other Ambulatory Visit: Payer: Self-pay | Admitting: Internal Medicine

## 2021-07-27 ENCOUNTER — Other Ambulatory Visit (HOSPITAL_COMMUNITY): Payer: Self-pay

## 2021-07-27 DIAGNOSIS — C3431 Malignant neoplasm of lower lobe, right bronchus or lung: Secondary | ICD-10-CM

## 2021-07-27 DIAGNOSIS — E782 Mixed hyperlipidemia: Secondary | ICD-10-CM | POA: Diagnosis not present

## 2021-07-27 DIAGNOSIS — I10 Essential (primary) hypertension: Secondary | ICD-10-CM | POA: Diagnosis not present

## 2021-07-27 DIAGNOSIS — E039 Hypothyroidism, unspecified: Secondary | ICD-10-CM | POA: Diagnosis not present

## 2021-07-27 MED ORDER — ERLOTINIB HCL 100 MG PO TABS
100.0000 mg | ORAL_TABLET | Freq: Every day | ORAL | 0 refills | Status: DC
  Filled 2021-07-29: qty 30, 30d supply, fill #0

## 2021-07-29 ENCOUNTER — Other Ambulatory Visit (HOSPITAL_COMMUNITY): Payer: Self-pay

## 2021-07-30 ENCOUNTER — Other Ambulatory Visit (HOSPITAL_COMMUNITY): Payer: Self-pay

## 2021-08-05 ENCOUNTER — Other Ambulatory Visit (HOSPITAL_COMMUNITY): Payer: Self-pay

## 2021-08-05 DIAGNOSIS — Z20822 Contact with and (suspected) exposure to covid-19: Secondary | ICD-10-CM | POA: Diagnosis not present

## 2021-08-11 ENCOUNTER — Telehealth: Payer: Self-pay | Admitting: Medical Oncology

## 2021-08-11 NOTE — Telephone Encounter (Signed)
Can pt take trazodone with Tarceva. Per Drug interaction program analysis on Up -to -Date, there is no interaction. ? ?I told pt .per Cassie , He can take the trazodone. ?

## 2021-08-19 ENCOUNTER — Inpatient Hospital Stay: Payer: Medicare Other | Attending: Internal Medicine | Admitting: Internal Medicine

## 2021-08-19 ENCOUNTER — Inpatient Hospital Stay: Payer: Medicare Other

## 2021-08-19 ENCOUNTER — Other Ambulatory Visit: Payer: Self-pay

## 2021-08-19 VITALS — BP 145/65 | HR 60 | Temp 97.7°F | Resp 17 | Wt 246.2 lb

## 2021-08-19 DIAGNOSIS — C3431 Malignant neoplasm of lower lobe, right bronchus or lung: Secondary | ICD-10-CM

## 2021-08-19 DIAGNOSIS — Z79899 Other long term (current) drug therapy: Secondary | ICD-10-CM | POA: Insufficient documentation

## 2021-08-19 DIAGNOSIS — R0602 Shortness of breath: Secondary | ICD-10-CM | POA: Diagnosis not present

## 2021-08-19 DIAGNOSIS — Z7982 Long term (current) use of aspirin: Secondary | ICD-10-CM | POA: Insufficient documentation

## 2021-08-19 DIAGNOSIS — R21 Rash and other nonspecific skin eruption: Secondary | ICD-10-CM | POA: Diagnosis not present

## 2021-08-19 LAB — CBC WITH DIFFERENTIAL (CANCER CENTER ONLY)
Abs Immature Granulocytes: 0.01 10*3/uL (ref 0.00–0.07)
Basophils Absolute: 0 10*3/uL (ref 0.0–0.1)
Basophils Relative: 1 %
Eosinophils Absolute: 0.2 10*3/uL (ref 0.0–0.5)
Eosinophils Relative: 4 %
HCT: 36.2 % — ABNORMAL LOW (ref 39.0–52.0)
Hemoglobin: 12.9 g/dL — ABNORMAL LOW (ref 13.0–17.0)
Immature Granulocytes: 0 %
Lymphocytes Relative: 44 %
Lymphs Abs: 2.5 10*3/uL (ref 0.7–4.0)
MCH: 35.1 pg — ABNORMAL HIGH (ref 26.0–34.0)
MCHC: 35.6 g/dL (ref 30.0–36.0)
MCV: 98.4 fL (ref 80.0–100.0)
Monocytes Absolute: 0.6 10*3/uL (ref 0.1–1.0)
Monocytes Relative: 11 %
Neutro Abs: 2.3 10*3/uL (ref 1.7–7.7)
Neutrophils Relative %: 40 %
Platelet Count: 179 10*3/uL (ref 150–400)
RBC: 3.68 MIL/uL — ABNORMAL LOW (ref 4.22–5.81)
RDW: 12.1 % (ref 11.5–15.5)
WBC Count: 5.7 10*3/uL (ref 4.0–10.5)
nRBC: 0 % (ref 0.0–0.2)

## 2021-08-19 LAB — CMP (CANCER CENTER ONLY)
ALT: 29 U/L (ref 0–44)
AST: 31 U/L (ref 15–41)
Albumin: 4 g/dL (ref 3.5–5.0)
Alkaline Phosphatase: 56 U/L (ref 38–126)
Anion gap: 4 — ABNORMAL LOW (ref 5–15)
BUN: 16 mg/dL (ref 8–23)
CO2: 30 mmol/L (ref 22–32)
Calcium: 9.4 mg/dL (ref 8.9–10.3)
Chloride: 103 mmol/L (ref 98–111)
Creatinine: 0.99 mg/dL (ref 0.61–1.24)
GFR, Estimated: >60 mL/min (ref >60)
Glucose, Bld: 113 mg/dL — ABNORMAL HIGH (ref 70–99)
Potassium: 3.5 mmol/L (ref 3.5–5.1)
Sodium: 137 mmol/L (ref 135–145)
Total Bilirubin: 0.6 mg/dL (ref 0.3–1.2)
Total Protein: 6.5 g/dL (ref 6.5–8.1)

## 2021-08-19 NOTE — Progress Notes (Signed)
[facility] [phone]   [phone]) [phone]  OFFICE PROGRESS NOTE  [name], MD 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  PRIOR THERAPY: 1) Status post right upper lobectomy under the care of Dr. Roxan Hockey on 01/23/2006.  2) Tarceva 150 mg by mouth daily as part of the BMS checkmate 370 clinical trial. Status post 6 weeks of treatment.  CURRENT THERAPY: Tarceva 100 mg by mouth daily as part of the BMS Checkmate 370 clinical trial. First dose started 07/17/2014 status post 85 months of treatment.  INTERVAL HISTORY: [name] 76 y.o. male returns to the clinic today for 2 months follow-up visit.  The patient is feeling fine today with no concerning complaints except for shortness of breath with exertion.  He denied having any current chest pain, cough or hemoptysis.  He has no nausea, vomiting, diarrhea or constipation.  He has no headache or visual changes.  He denied having any significant weight loss or night sweats.  The patient continues to tolerate his treatment with Tarceva fairly well.  He is here today for evaluation and repeat blood work.   MEDICAL HISTORY: Past Medical History:  Diagnosis Date   Cholelithiases 12/24/1939   Complication of anesthesia 2007   quit breathing with bronchoscopy, had to spend night   History of agent Orange exposure 44-45 yrs ago   History of pneumonia  last 3-4 yrs ago   3 -4 different times   Hypercholesterolemia    Hypertension    Hypothyroidism    lung ca dx'd 2007   lung right   Sinus disease    hx of   Sleep apnea    uses cpap setting opf 12    ALLERGIES:  is allergic to lisinopril and prednisone.  MEDICATIONS:  Current Outpatient Medications  Medication Sig Dispense Refill   amLODipine (NORVASC) 5  MG tablet Take 1 tablet (5 mg total) by mouth daily. 90 tablet 3   aspirin 81 MG tablet Take 81 mg by mouth every morning.      Cholecalciferol 2000 units CAPS Take 2,000 Units by mouth daily.      clindamycin (CLEOCIN T) 1 % external solution APPLY TO AFFECTED AREA(S) TWO TIMES A DAY AS NEEDED FOR SKIN IRRITATION 30 mL 0   erlotinib (TARCEVA) 100 MG tablet Take 1 tablet (100 mg total) by mouth daily. Take on an empty stomach 1 hour before meals or 2 hours after 30 tablet 0   levETIRAcetam (KEPPRA) 750 MG tablet Take 750 mg by mouth at bedtime.      metoprolol succinate (TOPROL-XL) 25 MG 24 hr tablet Take two tablets (50 mg) by mouth every morning.  Take one tablet (25 mg) by mouth every evening.  Take with or immediately following a meal. 270 tablet 3   Multiple Vitamins-Minerals (CENTRUM SILVER PO) Take 1 tablet by mouth daily.      nitroGLYCERIN (NITROSTAT) 0.4 MG SL tablet PLACE ONE TABLET UNDER THE TONGUE EVERY 5 MINUTES AS NEEDED FOR CHEST PAIN 25 tablet 10   polyethylene glycol (MIRALAX / GLYCOLAX) packet Take 17 g by mouth daily. Reported on 08/27/2015     psyllium (METAMUCIL) 58.6 % powder Take 1 packet by mouth daily. Reported on 08/27/2015     rosuvastatin (CRESTOR) 5 MG tablet Take 5 mg  by mouth every morning.      SYNTHROID 112 MCG tablet Take 112 mcg by mouth daily.     traZODone (DESYREL) 50 MG tablet Take 50 mg by mouth at bedtime.     valsartan-hydrochlorothiazide (DIOVAN-HCT) 320-25 MG per tablet Take 1 tablet by mouth every morning.      No current facility-administered medications for this visit.    SURGICAL HISTORY:  Past Surgical History:  Procedure Laterality Date   BIOPSY  05/21/2018   Procedure: BIOPSY;  Surgeon: Wonda Horner, MD;  Location: [facility];  Service: Endoscopy;;   BIOPSY  05/11/2021   Procedure: BIOPSY;  Surgeon: Otis Brace, MD;  Location: [facility];  Service: Gastroenterology;;   COLONOSCOPY WITH PROPOFOL N/A 03/19/2013   Procedure:  COLONOSCOPY WITH PROPOFOL;  Surgeon: Garlan Fair, MD;  Location: [facility];  Service: Endoscopy;  Laterality: N/A;   COLONOSCOPY WITH PROPOFOL N/A 05/11/2021   Procedure: COLONOSCOPY WITH PROPOFOL;  Surgeon: Otis Brace, MD;  Location: [facility];  Service: Gastroenterology;  Laterality: N/A;   Cystourethroscopy, [facility] TURP.  04/16/2010   ESOPHAGOGASTRODUODENOSCOPY (EGD) WITH PROPOFOL N/A 05/21/2018   Procedure: ESOPHAGOGASTRODUODENOSCOPY (EGD) WITH PROPOFOL;  Surgeon: Wonda Horner, MD;  Location: [facility];  Service: Endoscopy;  Laterality: N/A;   NASAL SINUS SURGERY  1987   POLYPECTOMY  05/11/2021   Procedure: POLYPECTOMY;  Surgeon: Otis Brace, MD;  Location: [facility];  Service: Gastroenterology;;   Right video-assisted thoracoscopy, right upper lobectomy and  01/23/2006   The Olympus video bronchoscope was introduced via the right  12/21/2005   TONSILLECTOMY  1957   Torn medial and lateral menisci, left knee.  11/06/2006    REVIEW OF SYSTEMS:  A comprehensive review of systems was negative except for: Respiratory: positive for dyspnea on exertion   PHYSICAL EXAMINATION: General appearance: alert, cooperative, and no distress Head: Normocephalic, without obvious abnormality, atraumatic Neck: no adenopathy, no JVD, supple, symmetrical, trachea midline, and thyroid not enlarged, symmetric, no tenderness/mass/nodules Lymph nodes: Cervical, supraclavicular, and axillary nodes normal. Resp: clear to auscultation bilaterally Back: symmetric, no curvature. ROM normal. No CVA tenderness. Cardio: regular rate and rhythm, S1, S2 normal, no murmur, click, rub or gallop GI: soft, non-tender; bowel sounds normal; no masses,  no organomegaly Extremities: extremities normal, atraumatic, no cyanosis or edema  ECOG PERFORMANCE STATUS: 0 - Asymptomatic  Blood pressure (!) 145/65, pulse 60, temperature 97.7 F (36.5 C), temperature source Tympanic, resp. rate 17, weight 246 lb  3 oz (111.7 kg), SpO2 96 %.  LABORATORY DATA: Lab Results  Component Value Date   WBC 5.7 08/19/2021   HGB 12.9 (L) 08/19/2021   HCT 36.2 (L) 08/19/2021   MCV 98.4 08/19/2021   PLT 179 08/19/2021      Chemistry      Component Value Date/Time   NA 137 08/19/2021 0845   NA 141 03/11/2019 0959   NA 139 04/06/2017 0932   K 3.5 08/19/2021 0845   K 3.8 04/06/2017 0932   CL 103 08/19/2021 0845   CL 102 04/24/2012 0853   CO2 30 08/19/2021 0845   CO2 29 04/06/2017 0932   BUN 16 08/19/2021 0845   BUN 18 03/11/2019 0959   BUN 14.4 04/06/2017 0932   CREATININE 0.99 08/19/2021 0845   CREATININE 1.0 04/06/2017 0932      Component Value Date/Time   CALCIUM 9.4 08/19/2021 0845   CALCIUM 9.5 04/06/2017 0932   ALKPHOS 56 08/19/2021 0845   ALKPHOS 58 04/06/2017 0932   AST 31  08/19/2021 0845   AST 27 04/06/2017 0932   ALT 29 08/19/2021 0845   ALT 27 04/06/2017 0932   BILITOT 0.6 08/19/2021 0845   BILITOT 0.45 04/06/2017 0932       RADIOGRAPHIC STUDIES: No results found.   ASSESSMENT AND PLAN:  This is a very pleasant 76 years old white male with recurrent non-small cell lung cancer, adenocarcinoma and positive EGFR mutation in exon 21. He is currently on Tarceva 100 mg by mouth daily status post around 85 months of treatment. The patient is tolerating his treatment well with no concerning adverse effects.  He continues to have shortness of breath with exertion. I recommended for him to continue his current treatment with Tarceva with the same dose. I will see him back for follow-up visit in 2 months for evaluation and repeat blood work. We will consider repeating his imaging studies in around 4 months from now. For the skin rash, he will continue to apply clindamycin lotion as needed. He was advised to call immediately if he has any other concerning symptoms in the interval. The patient voices understanding of current disease status and treatment options and is in agreement with  the current care plan. All questions were answered. The patient knows to call the clinic with any problems, questions or concerns. We can certainly see the patient much sooner if necessary.  Disclaimer: This note was dictated with voice recognition software. Similar sounding words can inadvertently be transcribed and may not be corrected upon review.

## 2021-08-23 DIAGNOSIS — G43009 Migraine without aura, not intractable, without status migrainosus: Secondary | ICD-10-CM | POA: Diagnosis not present

## 2021-08-23 DIAGNOSIS — E782 Mixed hyperlipidemia: Secondary | ICD-10-CM | POA: Diagnosis not present

## 2021-08-23 DIAGNOSIS — E039 Hypothyroidism, unspecified: Secondary | ICD-10-CM | POA: Diagnosis not present

## 2021-08-23 DIAGNOSIS — I10 Essential (primary) hypertension: Secondary | ICD-10-CM | POA: Diagnosis not present

## 2021-08-25 ENCOUNTER — Other Ambulatory Visit: Payer: Medicare Other

## 2021-08-25 ENCOUNTER — Ambulatory Visit: Payer: Medicare Other | Admitting: Internal Medicine

## 2021-08-27 ENCOUNTER — Other Ambulatory Visit (HOSPITAL_COMMUNITY): Payer: Self-pay

## 2021-08-27 ENCOUNTER — Other Ambulatory Visit: Payer: Self-pay | Admitting: Internal Medicine

## 2021-08-27 DIAGNOSIS — C3431 Malignant neoplasm of lower lobe, right bronchus or lung: Secondary | ICD-10-CM

## 2021-08-27 MED ORDER — ERLOTINIB HCL 100 MG PO TABS
100.0000 mg | ORAL_TABLET | Freq: Every day | ORAL | 0 refills | Status: DC
  Filled 2021-08-27: qty 30, 30d supply, fill #0

## 2021-08-31 ENCOUNTER — Other Ambulatory Visit (HOSPITAL_COMMUNITY): Payer: Self-pay

## 2021-09-02 ENCOUNTER — Other Ambulatory Visit (HOSPITAL_COMMUNITY): Payer: Self-pay

## 2021-09-16 DIAGNOSIS — K219 Gastro-esophageal reflux disease without esophagitis: Secondary | ICD-10-CM | POA: Diagnosis not present

## 2021-09-16 DIAGNOSIS — E039 Hypothyroidism, unspecified: Secondary | ICD-10-CM | POA: Diagnosis not present

## 2021-09-16 DIAGNOSIS — E782 Mixed hyperlipidemia: Secondary | ICD-10-CM | POA: Diagnosis not present

## 2021-09-16 DIAGNOSIS — G43009 Migraine without aura, not intractable, without status migrainosus: Secondary | ICD-10-CM | POA: Diagnosis not present

## 2021-09-16 DIAGNOSIS — I10 Essential (primary) hypertension: Secondary | ICD-10-CM | POA: Diagnosis not present

## 2021-09-21 ENCOUNTER — Other Ambulatory Visit: Payer: Self-pay | Admitting: Interventional Cardiology

## 2021-09-21 ENCOUNTER — Other Ambulatory Visit (HOSPITAL_COMMUNITY): Payer: Self-pay

## 2021-09-21 DIAGNOSIS — Z961 Presence of intraocular lens: Secondary | ICD-10-CM | POA: Diagnosis not present

## 2021-09-21 DIAGNOSIS — H52203 Unspecified astigmatism, bilateral: Secondary | ICD-10-CM | POA: Diagnosis not present

## 2021-09-23 ENCOUNTER — Other Ambulatory Visit: Payer: Self-pay | Admitting: Internal Medicine

## 2021-09-23 ENCOUNTER — Ambulatory Visit
Admission: RE | Admit: 2021-09-23 | Discharge: 2021-09-23 | Disposition: A | Discharge status: Home / Self Care | Payer: Medicare Other | Source: Ambulatory Visit | Attending: Internal Medicine | Admitting: Internal Medicine

## 2021-09-23 DIAGNOSIS — R609 Edema, unspecified: Secondary | ICD-10-CM | POA: Diagnosis not present

## 2021-09-23 DIAGNOSIS — D649 Anemia, unspecified: Secondary | ICD-10-CM | POA: Diagnosis not present

## 2021-09-23 DIAGNOSIS — Z902 Acquired absence of lung [part of]: Secondary | ICD-10-CM | POA: Diagnosis not present

## 2021-09-23 DIAGNOSIS — C3490 Malignant neoplasm of unspecified part of unspecified bronchus or lung: Secondary | ICD-10-CM | POA: Diagnosis not present

## 2021-09-23 DIAGNOSIS — E039 Hypothyroidism, unspecified: Secondary | ICD-10-CM | POA: Diagnosis not present

## 2021-09-23 DIAGNOSIS — R5383 Other fatigue: Secondary | ICD-10-CM | POA: Diagnosis not present

## 2021-09-23 DIAGNOSIS — I779 Disorder of arteries and arterioles, unspecified: Secondary | ICD-10-CM | POA: Diagnosis not present

## 2021-09-23 DIAGNOSIS — Z85118 Personal history of other malignant neoplasm of bronchus and lung: Secondary | ICD-10-CM | POA: Diagnosis not present

## 2021-09-23 DIAGNOSIS — I10 Essential (primary) hypertension: Secondary | ICD-10-CM | POA: Diagnosis not present

## 2021-09-23 DIAGNOSIS — I25110 Atherosclerotic heart disease of native coronary artery with unstable angina pectoris: Secondary | ICD-10-CM | POA: Diagnosis not present

## 2021-09-23 DIAGNOSIS — J984 Other disorders of lung: Secondary | ICD-10-CM | POA: Diagnosis not present

## 2021-09-23 DIAGNOSIS — J811 Chronic pulmonary edema: Secondary | ICD-10-CM | POA: Diagnosis not present

## 2021-10-06 DIAGNOSIS — D649 Anemia, unspecified: Secondary | ICD-10-CM | POA: Diagnosis not present

## 2021-10-06 DIAGNOSIS — E039 Hypothyroidism, unspecified: Secondary | ICD-10-CM | POA: Diagnosis not present

## 2021-10-06 DIAGNOSIS — R5383 Other fatigue: Secondary | ICD-10-CM | POA: Diagnosis not present

## 2021-10-06 DIAGNOSIS — C3490 Malignant neoplasm of unspecified part of unspecified bronchus or lung: Secondary | ICD-10-CM | POA: Diagnosis not present

## 2021-10-06 DIAGNOSIS — I779 Disorder of arteries and arterioles, unspecified: Secondary | ICD-10-CM | POA: Diagnosis not present

## 2021-10-06 DIAGNOSIS — I25110 Atherosclerotic heart disease of native coronary artery with unstable angina pectoris: Secondary | ICD-10-CM | POA: Diagnosis not present

## 2021-10-06 DIAGNOSIS — R609 Edema, unspecified: Secondary | ICD-10-CM | POA: Diagnosis not present

## 2021-10-06 DIAGNOSIS — I10 Essential (primary) hypertension: Secondary | ICD-10-CM | POA: Diagnosis not present

## 2021-10-07 ENCOUNTER — Other Ambulatory Visit (HOSPITAL_COMMUNITY): Payer: Self-pay

## 2021-10-11 ENCOUNTER — Other Ambulatory Visit (HOSPITAL_COMMUNITY): Payer: Self-pay

## 2021-10-11 ENCOUNTER — Other Ambulatory Visit: Payer: Self-pay | Admitting: Internal Medicine

## 2021-10-11 DIAGNOSIS — C3431 Malignant neoplasm of lower lobe, right bronchus or lung: Secondary | ICD-10-CM

## 2021-10-11 MED ORDER — ERLOTINIB HCL 100 MG PO TABS
100.0000 mg | ORAL_TABLET | Freq: Every day | ORAL | 0 refills | Status: DC
  Filled 2021-10-11: qty 30, 30d supply, fill #0

## 2021-10-13 ENCOUNTER — Other Ambulatory Visit (HOSPITAL_COMMUNITY): Payer: Self-pay

## 2021-10-20 ENCOUNTER — Other Ambulatory Visit (HOSPITAL_COMMUNITY): Payer: Self-pay

## 2021-10-21 ENCOUNTER — Inpatient Hospital Stay: Payer: Medicare Other | Attending: Internal Medicine

## 2021-10-21 ENCOUNTER — Inpatient Hospital Stay (HOSPITAL_BASED_OUTPATIENT_CLINIC_OR_DEPARTMENT_OTHER): Payer: Medicare Other | Admitting: Internal Medicine

## 2021-10-21 ENCOUNTER — Other Ambulatory Visit: Payer: Self-pay

## 2021-10-21 VITALS — BP 154/85 | HR 51 | Temp 97.8°F | Resp 15 | Wt 248.3 lb

## 2021-10-21 DIAGNOSIS — C3431 Malignant neoplasm of lower lobe, right bronchus or lung: Secondary | ICD-10-CM | POA: Diagnosis not present

## 2021-10-21 DIAGNOSIS — C3490 Malignant neoplasm of unspecified part of unspecified bronchus or lung: Secondary | ICD-10-CM | POA: Diagnosis not present

## 2021-10-21 DIAGNOSIS — Z902 Acquired absence of lung [part of]: Secondary | ICD-10-CM | POA: Insufficient documentation

## 2021-10-21 DIAGNOSIS — Z5111 Encounter for antineoplastic chemotherapy: Secondary | ICD-10-CM

## 2021-10-21 DIAGNOSIS — Z79899 Other long term (current) drug therapy: Secondary | ICD-10-CM | POA: Insufficient documentation

## 2021-10-21 DIAGNOSIS — R21 Rash and other nonspecific skin eruption: Secondary | ICD-10-CM | POA: Insufficient documentation

## 2021-10-21 LAB — CMP (CANCER CENTER ONLY)
ALT: 31 U/L (ref 0–44)
AST: 32 U/L (ref 15–41)
Albumin: 4.4 g/dL (ref 3.5–5.0)
Alkaline Phosphatase: 61 U/L (ref 38–126)
Anion gap: 5 (ref 5–15)
BUN: 16 mg/dL (ref 8–23)
CO2: 30 mmol/L (ref 22–32)
Calcium: 9.8 mg/dL (ref 8.9–10.3)
Chloride: 100 mmol/L (ref 98–111)
Creatinine: 1.06 mg/dL (ref 0.61–1.24)
GFR, Estimated: >60 mL/min (ref >60)
Glucose, Bld: 107 mg/dL — ABNORMAL HIGH (ref 70–99)
Potassium: 3.8 mmol/L (ref 3.5–5.1)
Sodium: 135 mmol/L (ref 135–145)
Total Bilirubin: 0.6 mg/dL (ref 0.3–1.2)
Total Protein: 6.7 g/dL (ref 6.5–8.1)

## 2021-10-21 LAB — CBC WITH DIFFERENTIAL (CANCER CENTER ONLY)
Abs Immature Granulocytes: 0.02 10*3/uL (ref 0.00–0.07)
Basophils Absolute: 0 10*3/uL (ref 0.0–0.1)
Basophils Relative: 1 %
Eosinophils Absolute: 0.3 10*3/uL (ref 0.0–0.5)
Eosinophils Relative: 5 %
HCT: 38.2 % — ABNORMAL LOW (ref 39.0–52.0)
Hemoglobin: 13.5 g/dL (ref 13.0–17.0)
Immature Granulocytes: 0 %
Lymphocytes Relative: 42 %
Lymphs Abs: 2.4 10*3/uL (ref 0.7–4.0)
MCH: 34.8 pg — ABNORMAL HIGH (ref 26.0–34.0)
MCHC: 35.3 g/dL (ref 30.0–36.0)
MCV: 98.5 fL (ref 80.0–100.0)
Monocytes Absolute: 0.6 10*3/uL (ref 0.1–1.0)
Monocytes Relative: 10 %
Neutro Abs: 2.4 10*3/uL (ref 1.7–7.7)
Neutrophils Relative %: 42 %
Platelet Count: 169 10*3/uL (ref 150–400)
RBC: 3.88 MIL/uL — ABNORMAL LOW (ref 4.22–5.81)
RDW: 12.3 % (ref 11.5–15.5)
WBC Count: 5.8 10*3/uL (ref 4.0–10.5)
nRBC: 0 % (ref 0.0–0.2)

## 2021-10-21 NOTE — Progress Notes (Signed)
[facility] [phone]   Fax:(336) [phone]  OFFICE PROGRESS NOTE  [name], MD 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  PRIOR THERAPY: 1) Status post right upper lobectomy under the care of Dr. Roxan Hockey on 01/23/2006.  2) Tarceva 150 mg by mouth daily as part of the BMS checkmate 370 clinical trial. Status post 6 weeks of treatment.  CURRENT THERAPY: Tarceva 100 mg by mouth daily as part of the BMS Checkmate 370 clinical trial. First dose started 07/17/2014 status post 87 months of treatment.  INTERVAL HISTORY: [name] 76 y.o. male returns to the clinic today for follow-up visit.  The patient is feeling fine today with no concerning complaints except for intermittent pain on the right side of the chest from the previous surgical scar.  He denied having any shortness of breath, cough or hemoptysis.  He has no nausea, vomiting, diarrhea or constipation.  He has no headache or visual changes.  He has no recent weight loss or night sweats.  He is here today for evaluation and repeat blood work.  MEDICAL HISTORY: Past Medical History:  Diagnosis Date   Cholelithiases 1/60/1093   Complication of anesthesia 2007   quit breathing with bronchoscopy, had to spend night   History of agent Orange exposure 44-45 yrs ago   History of pneumonia  last 3-4 yrs ago   3 -4 different times   Hypercholesterolemia    Hypertension    Hypothyroidism    lung ca dx'd 2007   lung right   Sinus disease    hx of   Sleep apnea    uses cpap setting opf 12    ALLERGIES:  is allergic to lisinopril and prednisone.  MEDICATIONS:  Current Outpatient Medications  Medication Sig Dispense Refill   amitriptyline (ELAVIL) 50 MG tablet Take 50 mg by mouth at bedtime.      aspirin 81 MG tablet Take 81 mg by mouth every morning.      Cholecalciferol 2000 units CAPS Take 2,000 Units by mouth daily.      clindamycin (CLEOCIN T) 1 % external solution APPLY TO AFFECTED AREA(S) TWO TIMES A DAY AS NEEDED FOR SKIN IRRITATION 30 mL 0   cloNIDine (CATAPRES) 0.1 MG tablet Take 0.1 mg by mouth daily.     erlotinib (TARCEVA) 100 MG tablet Take 1 tablet (100 mg total) by mouth daily. Take on an empty stomach 1 hour before meals or 2 hours after 30 tablet 0   levETIRAcetam (KEPPRA) 750 MG tablet Take 750 mg by mouth at bedtime.      metoprolol succinate (TOPROL-XL) 25 MG 24 hr tablet Take two tablets (50 mg) by mouth every morning.  Take one tablet (25 mg) by mouth every evening.  Take with or immediately following a meal. 270 tablet 3   Multiple Vitamins-Minerals (CENTRUM SILVER PO) Take 1 tablet by mouth daily.      nitroGLYCERIN (NITROSTAT) 0.4 MG SL tablet PLACE ONE TABLET UNDER THE TONGUE EVERY 5 MINUTES AS NEEDED FOR CHEST PAIN 25 tablet 10   polyethylene glycol (MIRALAX / GLYCOLAX) packet Take 17 g by mouth daily. Reported on 08/27/2015     psyllium (METAMUCIL) 58.6 % powder Take 1 packet by mouth daily. Reported on 08/27/2015     rosuvastatin (CRESTOR) 5  MG tablet Take 5 mg by mouth every morning.      SYNTHROID 112 MCG tablet Take 112 mcg by mouth daily.     valsartan-hydrochlorothiazide (DIOVAN-HCT) 320-25 MG per tablet Take 1 tablet by mouth every morning.      No current facility-administered medications for this visit.    SURGICAL HISTORY:  Past Surgical History:  Procedure Laterality Date   BIOPSY  05/21/2018   Procedure: BIOPSY;  Surgeon: Wonda Horner, MD;  Location: [facility];  Service: Endoscopy;;   BIOPSY  05/11/2021   Procedure: BIOPSY;  Surgeon: Otis Brace, MD;  Location: [facility];  Service: Gastroenterology;;   COLONOSCOPY WITH PROPOFOL N/A 03/19/2013   Procedure: COLONOSCOPY WITH PROPOFOL;  Surgeon: Garlan Fair, MD;  Location: [facility];  Service: Endoscopy;  Laterality: N/A;   COLONOSCOPY WITH PROPOFOL N/A 05/11/2021   Procedure: COLONOSCOPY WITH PROPOFOL;  Surgeon: Otis Brace, MD;  Location: [facility];  Service: Gastroenterology;  Laterality: N/A;   Cystourethroscopy, [facility] TURP.  04/16/2010   ESOPHAGOGASTRODUODENOSCOPY (EGD) WITH PROPOFOL N/A 05/21/2018   Procedure: ESOPHAGOGASTRODUODENOSCOPY (EGD) WITH PROPOFOL;  Surgeon: Wonda Horner, MD;  Location: [facility];  Service: Endoscopy;  Laterality: N/A;   NASAL SINUS SURGERY  1987   POLYPECTOMY  05/11/2021   Procedure: POLYPECTOMY;  Surgeon: Otis Brace, MD;  Location: [facility];  Service: Gastroenterology;;   Right video-assisted thoracoscopy, right upper lobectomy and  01/23/2006   The Olympus video bronchoscope was introduced via the right  12/21/2005   TONSILLECTOMY  1957   Torn medial and lateral menisci, left knee.  11/06/2006    REVIEW OF SYSTEMS:  A comprehensive review of systems was negative except for: Respiratory: positive for pleurisy/chest pain   PHYSICAL EXAMINATION: General appearance: alert, cooperative, and no distress Head: Normocephalic, without obvious abnormality, atraumatic Neck: no adenopathy, no JVD, supple, symmetrical, trachea midline, and thyroid not enlarged, symmetric, no tenderness/mass/nodules Lymph nodes: Cervical, supraclavicular, and axillary nodes normal. Resp: clear to auscultation bilaterally Back: symmetric, no curvature. ROM normal. No CVA tenderness. Cardio: regular rate and rhythm, S1, S2 normal, no murmur, click, rub or gallop GI: soft, non-tender; bowel sounds normal; no masses,  no organomegaly Extremities: extremities normal, atraumatic, no cyanosis or edema  ECOG PERFORMANCE STATUS: 0 - Asymptomatic  Blood pressure (!) 154/85, pulse (!) 51, temperature 97.8 F (36.6 C), temperature source Oral, resp. rate 15, weight 248 lb 4.8 oz (112.6 kg), SpO2 94 %.  LABORATORY DATA: Lab Results   Component Value Date   WBC 5.8 10/21/2021   HGB 13.5 10/21/2021   HCT 38.2 (L) 10/21/2021   MCV 98.5 10/21/2021   PLT 169 10/21/2021      Chemistry      Component Value Date/Time   NA 135 10/21/2021 0831   NA 141 03/11/2019 0959   NA 139 04/06/2017 0932   K 3.8 10/21/2021 0831   K 3.8 04/06/2017 0932   CL 100 10/21/2021 0831   CL 102 04/24/2012 0853   CO2 30 10/21/2021 0831   CO2 29 04/06/2017 0932   BUN 16 10/21/2021 0831   BUN 18 03/11/2019 0959   BUN 14.4 04/06/2017 0932   CREATININE 1.06 10/21/2021 0831   CREATININE 1.0 04/06/2017 0932      Component Value Date/Time   CALCIUM 9.8 10/21/2021 0831   CALCIUM 9.5 04/06/2017 0932   ALKPHOS 61 10/21/2021 0831   ALKPHOS 58 04/06/2017 0932   AST 32 10/21/2021 0831   AST 27 04/06/2017 0932   ALT 31  10/21/2021 0831   ALT 27 04/06/2017 0932   BILITOT 0.6 10/21/2021 0831   BILITOT 0.45 04/06/2017 0932       RADIOGRAPHIC STUDIES: DG Chest 2 View  Result Date: 09/23/2021 CLINICAL DATA:  Peripheral edema. History of right lung cancer post right lower lobectomy. EXAM: CHEST - 2 VIEW COMPARISON:  10/22/2019 FINDINGS: Lungs are adequately inflated with volume loss of the right lung compatible previous right lower lobectomy. Minimal stable patchy density over the right midlung. Left lung is clear. No effusion. Cardiomediastinal silhouette and remainder of the exam is unchanged. IMPRESSION: 1. No acute findings. 2. Stable post right lower lobectomy changes. Electronically Signed   By: Marin Olp M.D.   On: 09/23/2021 16:47     ASSESSMENT AND PLAN:  This is a very pleasant 76 years old white male with recurrent non-small cell lung cancer, adenocarcinoma and positive EGFR mutation in exon 21. He is currently on Tarceva 100 mg by mouth daily status post around 87 months of treatment. The patient has been tolerating his treatment with Tarceva fairly well with no concerning adverse effects. I recommended for him to continue his  current treatment with Tarceva with the same dose. I will see him back for follow-up visit in 2 months for evaluation with repeat CT scan of the chest, abdomen and pelvis for restaging of his disease. For the skin rash, he will continue to apply clindamycin lotion as needed. The patient was advised to call immediately if he has any concerning symptoms in the interval. The patient voices understanding of current disease status and treatment options and is in agreement with the current care plan. All questions were answered. The patient knows to call the clinic with any problems, questions or concerns. We can certainly see the patient much sooner if necessary.  Disclaimer: This note was dictated with voice recognition software. Similar sounding words can inadvertently be transcribed and may not be corrected upon review.

## 2021-11-12 ENCOUNTER — Other Ambulatory Visit: Payer: Self-pay | Admitting: Internal Medicine

## 2021-11-12 ENCOUNTER — Other Ambulatory Visit (HOSPITAL_COMMUNITY): Payer: Self-pay

## 2021-11-12 DIAGNOSIS — C3431 Malignant neoplasm of lower lobe, right bronchus or lung: Secondary | ICD-10-CM

## 2021-11-14 MED ORDER — ERLOTINIB HCL 100 MG PO TABS
100.0000 mg | ORAL_TABLET | Freq: Every day | ORAL | 0 refills | Status: DC
  Filled 2021-11-15: qty 30, 30d supply, fill #0

## 2021-11-15 ENCOUNTER — Other Ambulatory Visit (HOSPITAL_COMMUNITY): Payer: Self-pay

## 2021-11-25 ENCOUNTER — Other Ambulatory Visit (HOSPITAL_COMMUNITY): Payer: Self-pay

## 2021-12-02 DIAGNOSIS — E039 Hypothyroidism, unspecified: Secondary | ICD-10-CM | POA: Diagnosis not present

## 2021-12-02 DIAGNOSIS — K219 Gastro-esophageal reflux disease without esophagitis: Secondary | ICD-10-CM | POA: Diagnosis not present

## 2021-12-02 DIAGNOSIS — I10 Essential (primary) hypertension: Secondary | ICD-10-CM | POA: Diagnosis not present

## 2021-12-02 DIAGNOSIS — E782 Mixed hyperlipidemia: Secondary | ICD-10-CM | POA: Diagnosis not present

## 2021-12-13 DIAGNOSIS — D649 Anemia, unspecified: Secondary | ICD-10-CM | POA: Diagnosis not present

## 2021-12-13 DIAGNOSIS — Z0000 Encounter for general adult medical examination without abnormal findings: Secondary | ICD-10-CM | POA: Diagnosis not present

## 2021-12-13 DIAGNOSIS — Z1211 Encounter for screening for malignant neoplasm of colon: Secondary | ICD-10-CM | POA: Diagnosis not present

## 2021-12-13 DIAGNOSIS — R5383 Other fatigue: Secondary | ICD-10-CM | POA: Diagnosis not present

## 2021-12-13 DIAGNOSIS — E039 Hypothyroidism, unspecified: Secondary | ICD-10-CM | POA: Diagnosis not present

## 2021-12-13 DIAGNOSIS — Z1331 Encounter for screening for depression: Secondary | ICD-10-CM | POA: Diagnosis not present

## 2021-12-13 DIAGNOSIS — I25110 Atherosclerotic heart disease of native coronary artery with unstable angina pectoris: Secondary | ICD-10-CM | POA: Diagnosis not present

## 2021-12-13 DIAGNOSIS — R609 Edema, unspecified: Secondary | ICD-10-CM | POA: Diagnosis not present

## 2021-12-13 DIAGNOSIS — R21 Rash and other nonspecific skin eruption: Secondary | ICD-10-CM | POA: Diagnosis not present

## 2021-12-13 DIAGNOSIS — Z23 Encounter for immunization: Secondary | ICD-10-CM | POA: Diagnosis not present

## 2021-12-13 DIAGNOSIS — Z125 Encounter for screening for malignant neoplasm of prostate: Secondary | ICD-10-CM | POA: Diagnosis not present

## 2021-12-13 DIAGNOSIS — I700 Atherosclerosis of aorta: Secondary | ICD-10-CM | POA: Diagnosis not present

## 2021-12-13 DIAGNOSIS — C3490 Malignant neoplasm of unspecified part of unspecified bronchus or lung: Secondary | ICD-10-CM | POA: Diagnosis not present

## 2021-12-13 DIAGNOSIS — I10 Essential (primary) hypertension: Secondary | ICD-10-CM | POA: Diagnosis not present

## 2021-12-14 ENCOUNTER — Other Ambulatory Visit (HOSPITAL_COMMUNITY): Payer: Self-pay

## 2021-12-14 ENCOUNTER — Other Ambulatory Visit: Payer: Self-pay | Admitting: Internal Medicine

## 2021-12-14 DIAGNOSIS — C3431 Malignant neoplasm of lower lobe, right bronchus or lung: Secondary | ICD-10-CM

## 2021-12-14 MED ORDER — ERLOTINIB HCL 100 MG PO TABS
100.0000 mg | ORAL_TABLET | Freq: Every day | ORAL | 0 refills | Status: DC
  Filled 2021-12-22: qty 30, 30d supply, fill #0

## 2021-12-17 ENCOUNTER — Telehealth: Payer: Self-pay

## 2021-12-17 NOTE — Telephone Encounter (Signed)
Pt called advising his podiatrist prescribed clotrimazole cream 1% for her feet and wants to know if it is okay to use the cream while taking Tarceva.  I have called the pt back and advised it is okay to use the cream. Pt thanked me for the return call.

## 2021-12-20 ENCOUNTER — Inpatient Hospital Stay: Payer: Medicare Other | Attending: Internal Medicine

## 2021-12-20 DIAGNOSIS — R918 Other nonspecific abnormal finding of lung field: Secondary | ICD-10-CM | POA: Diagnosis not present

## 2021-12-20 DIAGNOSIS — C3411 Malignant neoplasm of upper lobe, right bronchus or lung: Secondary | ICD-10-CM | POA: Diagnosis not present

## 2021-12-20 DIAGNOSIS — R21 Rash and other nonspecific skin eruption: Secondary | ICD-10-CM | POA: Diagnosis not present

## 2021-12-20 DIAGNOSIS — Z79899 Other long term (current) drug therapy: Secondary | ICD-10-CM | POA: Diagnosis not present

## 2021-12-20 DIAGNOSIS — C3490 Malignant neoplasm of unspecified part of unspecified bronchus or lung: Secondary | ICD-10-CM

## 2021-12-20 LAB — CBC WITH DIFFERENTIAL (CANCER CENTER ONLY)
Abs Immature Granulocytes: 0.01 10*3/uL (ref 0.00–0.07)
Basophils Absolute: 0 10*3/uL (ref 0.0–0.1)
Basophils Relative: 1 %
Eosinophils Absolute: 0.3 10*3/uL (ref 0.0–0.5)
Eosinophils Relative: 5 %
HCT: 38.6 % — ABNORMAL LOW (ref 39.0–52.0)
Hemoglobin: 13.6 g/dL (ref 13.0–17.0)
Immature Granulocytes: 0 %
Lymphocytes Relative: 45 %
Lymphs Abs: 2.5 10*3/uL (ref 0.7–4.0)
MCH: 34.9 pg — ABNORMAL HIGH (ref 26.0–34.0)
MCHC: 35.2 g/dL (ref 30.0–36.0)
MCV: 99 fL (ref 80.0–100.0)
Monocytes Absolute: 0.5 10*3/uL (ref 0.1–1.0)
Monocytes Relative: 8 %
Neutro Abs: 2.3 10*3/uL (ref 1.7–7.7)
Neutrophils Relative %: 41 %
Platelet Count: 178 10*3/uL (ref 150–400)
RBC: 3.9 MIL/uL — ABNORMAL LOW (ref 4.22–5.81)
RDW: 12.4 % (ref 11.5–15.5)
WBC Count: 5.6 10*3/uL (ref 4.0–10.5)
nRBC: 0 % (ref 0.0–0.2)

## 2021-12-20 LAB — CMP (CANCER CENTER ONLY)
ALT: 31 U/L (ref 0–44)
AST: 34 U/L (ref 15–41)
Albumin: 4.3 g/dL (ref 3.5–5.0)
Alkaline Phosphatase: 62 U/L (ref 38–126)
Anion gap: 6 (ref 5–15)
BUN: 19 mg/dL (ref 8–23)
CO2: 30 mmol/L (ref 22–32)
Calcium: 9.8 mg/dL (ref 8.9–10.3)
Chloride: 103 mmol/L (ref 98–111)
Creatinine: 1.03 mg/dL (ref 0.61–1.24)
GFR, Estimated: >60 mL/min (ref >60)
Glucose, Bld: 118 mg/dL — ABNORMAL HIGH (ref 70–99)
Potassium: 4.2 mmol/L (ref 3.5–5.1)
Sodium: 139 mmol/L (ref 135–145)
Total Bilirubin: 0.8 mg/dL (ref 0.3–1.2)
Total Protein: 6.9 g/dL (ref 6.5–8.1)

## 2021-12-21 ENCOUNTER — Ambulatory Visit (HOSPITAL_COMMUNITY)
Admission: RE | Admit: 2021-12-21 | Discharge: 2021-12-21 | Disposition: A | Discharge status: Home / Self Care | Payer: Medicare Other | Source: Ambulatory Visit | Attending: Internal Medicine | Admitting: Internal Medicine

## 2021-12-21 DIAGNOSIS — K5730 Diverticulosis of large intestine without perforation or abscess without bleeding: Secondary | ICD-10-CM | POA: Diagnosis not present

## 2021-12-21 DIAGNOSIS — K8020 Calculus of gallbladder without cholecystitis without obstruction: Secondary | ICD-10-CM | POA: Diagnosis not present

## 2021-12-21 DIAGNOSIS — C3490 Malignant neoplasm of unspecified part of unspecified bronchus or lung: Secondary | ICD-10-CM | POA: Insufficient documentation

## 2021-12-21 DIAGNOSIS — R918 Other nonspecific abnormal finding of lung field: Secondary | ICD-10-CM | POA: Diagnosis not present

## 2021-12-21 MED ORDER — IOHEXOL 300 MG/ML  SOLN
100.0000 mL | Freq: Once | INTRAMUSCULAR | Status: AC | PRN
  Administered 2021-12-21: 100 mL via INTRAVENOUS

## 2021-12-21 MED ORDER — SODIUM CHLORIDE (PF) 0.9 % IJ SOLN
INTRAMUSCULAR | Status: AC
  Filled 2021-12-21: qty 50

## 2021-12-22 ENCOUNTER — Other Ambulatory Visit (HOSPITAL_COMMUNITY): Payer: Self-pay

## 2021-12-23 ENCOUNTER — Inpatient Hospital Stay (HOSPITAL_BASED_OUTPATIENT_CLINIC_OR_DEPARTMENT_OTHER): Payer: Medicare Other | Admitting: Internal Medicine

## 2021-12-23 ENCOUNTER — Other Ambulatory Visit: Payer: Self-pay

## 2021-12-23 ENCOUNTER — Other Ambulatory Visit (HOSPITAL_COMMUNITY): Payer: Self-pay

## 2021-12-23 VITALS — BP 153/85 | HR 50 | Temp 97.6°F | Resp 16 | Wt 246.5 lb

## 2021-12-23 DIAGNOSIS — R918 Other nonspecific abnormal finding of lung field: Secondary | ICD-10-CM | POA: Diagnosis not present

## 2021-12-23 DIAGNOSIS — Z79899 Other long term (current) drug therapy: Secondary | ICD-10-CM | POA: Diagnosis not present

## 2021-12-23 DIAGNOSIS — C3411 Malignant neoplasm of upper lobe, right bronchus or lung: Secondary | ICD-10-CM | POA: Diagnosis not present

## 2021-12-23 DIAGNOSIS — C3431 Malignant neoplasm of lower lobe, right bronchus or lung: Secondary | ICD-10-CM

## 2021-12-23 DIAGNOSIS — R21 Rash and other nonspecific skin eruption: Secondary | ICD-10-CM | POA: Diagnosis not present

## 2021-12-23 NOTE — Progress Notes (Signed)
[place] Telephone:(336) [phone]   Fax:(336) [phone]  OFFICE PROGRESS NOTE  [name], MD 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  PRIOR THERAPY: 1) Status post right upper lobectomy under the care of Dr. Roxan Hockey on 01/23/2006.  2) Tarceva 150 mg by mouth daily as part of the BMS checkmate 370 clinical trial. Status post 6 weeks of treatment.  CURRENT THERAPY: Tarceva 100 mg by mouth daily as part of the BMS Checkmate 370 clinical trial. First dose started 07/17/2014 status post 89 months of treatment.  INTERVAL HISTORY: [name] 76 y.o. male returns to the clinic today for follow-up visit.  The patient is feeling fine today with no concerning complaints.  He denied having any chest pain, shortness of breath, cough or hemoptysis.  He has no nausea, vomiting, diarrhea or constipation.  He has no headache or visual changes.  He has no recent weight loss or night sweats.  He has been tolerating his treatment with Erlotinib fairly well.  He had repeat CT scan of the chest, abdomen and pelvis performed recently and he is here for evaluation and discussion of his scan results.  MEDICAL HISTORY: Past Medical History:  Diagnosis Date   Cholelithiases 2/98/4730   Complication of anesthesia 2007   quit breathing with bronchoscopy, had to spend night   History of agent Orange exposure 44-45 yrs ago   History of pneumonia  last 3-4 yrs ago   3 -4 different times   Hypercholesterolemia    Hypertension    Hypothyroidism    lung ca dx'd 2007   lung right   Sinus disease    hx of   Sleep apnea    uses cpap setting opf 12    ALLERGIES:  is allergic to lisinopril and prednisone.  MEDICATIONS:  Current Outpatient Medications  Medication Sig Dispense Refill    amitriptyline (ELAVIL) 50 MG tablet Take 50 mg by mouth at bedtime.     aspirin 81 MG tablet Take 81 mg by mouth every morning.      Cholecalciferol 2000 units CAPS Take 2,000 Units by mouth daily.      clindamycin (CLEOCIN T) 1 % external solution APPLY TO AFFECTED AREA(S) TWO TIMES A DAY AS NEEDED FOR SKIN IRRITATION 30 mL 0   cloNIDine (CATAPRES) 0.2 MG tablet Take 0.2 mg by mouth daily.     clotrimazole (LOTRIMIN) 1 % cream Apply 1 Application topically 2 (two) times daily.     erlotinib (TARCEVA) 100 MG tablet Take 1 tablet (100 mg total) by mouth daily. Take on an empty stomach 1 hour before meals or 2 hours after 30 tablet 0   levETIRAcetam (KEPPRA) 750 MG tablet Take 750 mg by mouth at bedtime.      metoprolol succinate (TOPROL-XL) 25 MG 24 hr tablet Take two tablets (50 mg) by mouth every morning.  Take one tablet (25 mg) by mouth every evening.  Take with or immediately following a meal. 270 tablet 3   Multiple Vitamins-Minerals (CENTRUM SILVER PO) Take 1 tablet by mouth daily.      nitroGLYCERIN (NITROSTAT) 0.4 MG SL tablet PLACE ONE TABLET UNDER THE TONGUE EVERY 5 MINUTES AS NEEDED FOR CHEST PAIN 25 tablet 10   polyethylene glycol (MIRALAX / GLYCOLAX) packet Take 17 g by mouth  daily. Reported on 08/27/2015     psyllium (METAMUCIL) 58.6 % powder Take 1 packet by mouth daily. Reported on 08/27/2015     rosuvastatin (CRESTOR) 5 MG tablet Take 5 mg by mouth every morning.      SYNTHROID 112 MCG tablet Take 112 mcg by mouth daily.     valsartan-hydrochlorothiazide (DIOVAN-HCT) 320-25 MG per tablet Take 1 tablet by mouth every morning.      No current facility-administered medications for this visit.    SURGICAL HISTORY:  Past Surgical History:  Procedure Laterality Date   BIOPSY  05/21/2018   Procedure: BIOPSY;  Surgeon: Wonda Horner, MD;  Location: [facility];  Service: Endoscopy;;   BIOPSY  05/11/2021   Procedure: BIOPSY;  Surgeon: Otis Brace, MD;  Location: [facility];   Service: Gastroenterology;;   COLONOSCOPY WITH PROPOFOL N/A 03/19/2013   Procedure: COLONOSCOPY WITH PROPOFOL;  Surgeon: Garlan Fair, MD;  Location: [facility];  Service: Endoscopy;  Laterality: N/A;   COLONOSCOPY WITH PROPOFOL N/A 05/11/2021   Procedure: COLONOSCOPY WITH PROPOFOL;  Surgeon: Otis Brace, MD;  Location: [facility];  Service: Gastroenterology;  Laterality: N/A;   Cystourethroscopy, [facility] TURP.  04/16/2010   ESOPHAGOGASTRODUODENOSCOPY (EGD) WITH PROPOFOL N/A 05/21/2018   Procedure: ESOPHAGOGASTRODUODENOSCOPY (EGD) WITH PROPOFOL;  Surgeon: Wonda Horner, MD;  Location: [facility];  Service: Endoscopy;  Laterality: N/A;   NASAL SINUS SURGERY  1987   POLYPECTOMY  05/11/2021   Procedure: POLYPECTOMY;  Surgeon: Otis Brace, MD;  Location: [facility];  Service: Gastroenterology;;   Right video-assisted thoracoscopy, right upper lobectomy and  01/23/2006   The Olympus video bronchoscope was introduced via the right  12/21/2005   TONSILLECTOMY  1957   Torn medial and lateral menisci, left knee.  11/06/2006    REVIEW OF SYSTEMS:  Constitutional: negative Eyes: negative Ears, nose, mouth, throat, and face: negative Respiratory: negative Cardiovascular: negative Gastrointestinal: negative Genitourinary:negative Integument/breast: negative Hematologic/lymphatic: negative Musculoskeletal:negative Neurological: negative Behavioral/Psych: negative Endocrine: negative Allergic/Immunologic: negative   PHYSICAL EXAMINATION: General appearance: alert, cooperative, and no distress Head: Normocephalic, without obvious abnormality, atraumatic Neck: no adenopathy, no JVD, supple, symmetrical, trachea midline, and thyroid not enlarged, symmetric, no tenderness/mass/nodules Lymph nodes: Cervical, supraclavicular, and axillary nodes normal. Resp: clear to auscultation bilaterally Back: symmetric, no curvature. ROM normal. No CVA tenderness. Cardio: regular rate and  rhythm, S1, S2 normal, no murmur, click, rub or gallop GI: soft, non-tender; bowel sounds normal; no masses,  no organomegaly Extremities: extremities normal, atraumatic, no cyanosis or edema Neurologic: Alert and oriented X 3, normal strength and tone. Normal symmetric reflexes. Normal coordination and gait  ECOG PERFORMANCE STATUS: 0 - Asymptomatic  Blood pressure (!) 153/85, pulse (!) 50, temperature 97.6 F (36.4 C), temperature source Oral, resp. rate 16, weight 246 lb 8 oz (111.8 kg), SpO2 96 %.  LABORATORY DATA: Lab Results  Component Value Date   WBC 5.6 12/20/2021   HGB 13.6 12/20/2021   HCT 38.6 (L) 12/20/2021   MCV 99.0 12/20/2021   PLT 178 12/20/2021      Chemistry      Component Value Date/Time   NA 139 12/20/2021 0904   NA 141 03/11/2019 0959   NA 139 04/06/2017 0932   K 4.2 12/20/2021 0904   K 3.8 04/06/2017 0932   CL 103 12/20/2021 0904   CL 102 04/24/2012 0853   CO2 30 12/20/2021 0904   CO2 29 04/06/2017 0932   BUN 19 12/20/2021 0904   BUN 18 03/11/2019 0959   BUN  14.4 04/06/2017 0932   CREATININE 1.03 12/20/2021 0904   CREATININE 1.0 04/06/2017 0932      Component Value Date/Time   CALCIUM 9.8 12/20/2021 0904   CALCIUM 9.5 04/06/2017 0932   ALKPHOS 62 12/20/2021 0904   ALKPHOS 58 04/06/2017 0932   AST 34 12/20/2021 0904   AST 27 04/06/2017 0932   ALT 31 12/20/2021 0904   ALT 27 04/06/2017 0932   BILITOT 0.8 12/20/2021 0904   BILITOT 0.45 04/06/2017 0932       RADIOGRAPHIC STUDIES: CT Chest W Contrast  Result Date: 12/21/2021 CLINICAL DATA:  Non-small-cell lung cancer. Restaging. Right upper lobectomy. * Tracking Code: BO * EXAM: CT CHEST, ABDOMEN, AND PELVIS WITH CONTRAST TECHNIQUE: Multidetector CT imaging of the chest, abdomen and pelvis was performed following the standard protocol during bolus administration of intravenous contrast. RADIATION DOSE REDUCTION: This exam was performed according to the departmental dose-optimization program  which includes automated exposure control, adjustment of the mA and/or kV according to patient size and/or use of iterative reconstruction technique. CONTRAST:  148mL OMNIPAQUE IOHEXOL 300 MG/ML  SOLN COMPARISON:  06/22/2021 FINDINGS: CT CHEST FINDINGS Cardiovascular: Aortic atherosclerosis. Normal heart size, without pericardial effusion. No central pulmonary embolism, on this non-dedicated study. Mediastinum/Nodes: No supraclavicular adenopathy. No mediastinal or hilar adenopathy. Lungs/Pleura: Right hemidiaphragm elevation. No pleural fluid. Right upper lobectomy. Again identified are right lower lobe pulmonary nodules. Example superiorly at 2.0 x 0.8 cm on 39/6 versus 1.8 x 0.8 cm on the prior exam (when remeasured). More medially, at 3.0 x 1.4 cm on 41/6 versus 2.3 x 1.3 cm previously (when remeasured). More caudally and anteriorly at 3.9 x 1.9 cm on 56/6 versus 3.8 x 1.7 cm on the prior exam (when remeasured). Within the posteromedial right lower lobe at 1.5 x 1.2 cm on 59/6 versus 1.5 x 1.3 cm on the prior exam (when remeasured). Musculoskeletal: No acute osseous abnormality. CT ABDOMEN PELVIS FINDINGS Hepatobiliary: 2.5 cm gallstone.  Normal liver. Pancreas: Normal, without mass or ductal dilatation. Spleen: Normal in size, without focal abnormality. Adrenals/Urinary Tract: Normal adrenal glands. Exophytic upper pole right renal 3.5 cm cyst . In the absence of clinically indicated signs/symptoms require(s) no independent follow-up. Normal left kidney. Normal urinary bladder. Stomach/Bowel: Normal stomach, without wall thickening. Scattered colonic diverticula. Colonic stool burden suggests constipation. Normal terminal ileum and appendix. Normal small bowel. Vascular/Lymphatic: Aortic atherosclerosis. No abdominopelvic adenopathy. Reproductive: Normal prostate. Other: No significant free fluid. No evidence of omental or peritoneal disease. Tiny fat containing left inguinal hernia. Musculoskeletal:  Lumbosacral spondylosis. IMPRESSION: 1. Status post right upper lobectomy. 2. Mild enlargement of the majority of right lower lobe lung nodules, consistent with disease progression. 3. No thoracic adenopathy or evidence of metastatic disease in the abdomen/pelvis. 4. Cholelithiasis 5.  Aortic Atherosclerosis (ICD10-I70.0). Electronically Signed   By: Abigail Miyamoto M.D.   On: 12/21/2021 15:21   CT Abdomen Pelvis W Contrast  Result Date: 12/21/2021 CLINICAL DATA:  Non-small-cell lung cancer. Restaging. Right upper lobectomy. * Tracking Code: BO * EXAM: CT CHEST, ABDOMEN, AND PELVIS WITH CONTRAST TECHNIQUE: Multidetector CT imaging of the chest, abdomen and pelvis was performed following the standard protocol during bolus administration of intravenous contrast. RADIATION DOSE REDUCTION: This exam was performed according to the departmental dose-optimization program which includes automated exposure control, adjustment of the mA and/or kV according to patient size and/or use of iterative reconstruction technique. CONTRAST:  186mL OMNIPAQUE IOHEXOL 300 MG/ML  SOLN COMPARISON:  06/22/2021 FINDINGS: CT CHEST FINDINGS Cardiovascular: Aortic  atherosclerosis. Normal heart size, without pericardial effusion. No central pulmonary embolism, on this non-dedicated study. Mediastinum/Nodes: No supraclavicular adenopathy. No mediastinal or hilar adenopathy. Lungs/Pleura: Right hemidiaphragm elevation. No pleural fluid. Right upper lobectomy. Again identified are right lower lobe pulmonary nodules. Example superiorly at 2.0 x 0.8 cm on 39/6 versus 1.8 x 0.8 cm on the prior exam (when remeasured). More medially, at 3.0 x 1.4 cm on 41/6 versus 2.3 x 1.3 cm previously (when remeasured). More caudally and anteriorly at 3.9 x 1.9 cm on 56/6 versus 3.8 x 1.7 cm on the prior exam (when remeasured). Within the posteromedial right lower lobe at 1.5 x 1.2 cm on 59/6 versus 1.5 x 1.3 cm on the prior exam (when remeasured). Musculoskeletal:  No acute osseous abnormality. CT ABDOMEN PELVIS FINDINGS Hepatobiliary: 2.5 cm gallstone.  Normal liver. Pancreas: Normal, without mass or ductal dilatation. Spleen: Normal in size, without focal abnormality. Adrenals/Urinary Tract: Normal adrenal glands. Exophytic upper pole right renal 3.5 cm cyst . In the absence of clinically indicated signs/symptoms require(s) no independent follow-up. Normal left kidney. Normal urinary bladder. Stomach/Bowel: Normal stomach, without wall thickening. Scattered colonic diverticula. Colonic stool burden suggests constipation. Normal terminal ileum and appendix. Normal small bowel. Vascular/Lymphatic: Aortic atherosclerosis. No abdominopelvic adenopathy. Reproductive: Normal prostate. Other: No significant free fluid. No evidence of omental or peritoneal disease. Tiny fat containing left inguinal hernia. Musculoskeletal: Lumbosacral spondylosis. IMPRESSION: 1. Status post right upper lobectomy. 2. Mild enlargement of the majority of right lower lobe lung nodules, consistent with disease progression. 3. No thoracic adenopathy or evidence of metastatic disease in the abdomen/pelvis. 4. Cholelithiasis 5.  Aortic Atherosclerosis (ICD10-I70.0). Electronically Signed   By: Abigail Miyamoto M.D.   On: 12/21/2021 15:21     ASSESSMENT AND PLAN:  This is a very pleasant 76 years old white male with recurrent non-small cell lung cancer, adenocarcinoma and positive EGFR mutation in exon 21. He is currently on Tarceva 100 mg by mouth daily status post around 89 months of treatment. He has been tolerating this treatment fairly well with no concerning adverse effect except for occasional mild skin rash. The patient had repeat CT scan of the chest, abdomen and pelvis performed recently.  I personally and independently reviewed the scan images and discussed the result with the patient today. His scan showed mild increase in some of the pulmonary nodules but not to the point that I need to  change his treatment now.  I discussed the scan and my recommendation with the patient and he is in agreement with continuing the treatment with Erlotinib for now. I will see him back for follow-up visit in 2 months for evaluation and repeat blood work and I will consider repeating his imaging studies in 4 months from now. The patient was advised to call immediately if he has any other concerning symptoms in the interval. The patient voices understanding of current disease status and treatment options and is in agreement with the current care plan. All questions were answered. The patient knows to call the clinic with any problems, questions or concerns. We can certainly see the patient much sooner if necessary.  Disclaimer: This note was dictated with voice recognition software. Similar sounding words can inadvertently be transcribed and may not be corrected upon review.

## 2021-12-24 ENCOUNTER — Other Ambulatory Visit (HOSPITAL_COMMUNITY): Payer: Self-pay

## 2021-12-25 ENCOUNTER — Other Ambulatory Visit (HOSPITAL_COMMUNITY): Payer: Self-pay

## 2021-12-29 ENCOUNTER — Other Ambulatory Visit (HOSPITAL_COMMUNITY): Payer: Self-pay

## 2022-01-04 ENCOUNTER — Ambulatory Visit (INDEPENDENT_AMBULATORY_CARE_PROVIDER_SITE_OTHER): Payer: Medicare Other | Admitting: Thoracic Surgery (Cardiothoracic Vascular Surgery)

## 2022-01-04 ENCOUNTER — Encounter: Payer: Self-pay | Admitting: Thoracic Surgery (Cardiothoracic Vascular Surgery)

## 2022-01-04 VITALS — BP 163/92 | HR 55 | Resp 20 | Ht 72.0 in | Wt 248.7 lb

## 2022-01-04 DIAGNOSIS — C3431 Malignant neoplasm of lower lobe, right bronchus or lung: Secondary | ICD-10-CM

## 2022-01-04 NOTE — Progress Notes (Signed)
[address]       [place] [address]             [phone]      HPI: [name] returns for a scheduled follow-up visit regarding his recurrent lung cancer  [name] is a 76 year old man with a history of hypertension, hyperlipidemia, hypothyroidism, agent orange, adenocarcinoma of the lung, and sleep apnea.  He had a stage Ia adenocarcinoma treated with a right upper lobectomy in 2007.  He developed a recurrence about 10 years later.  Biopsy showed adenocarcinoma that was EGFR positive.  He has been on Tarceva since then.  He has been feeling well.  He recently saw Dr. [name].  There was some mild growth in his lung nodules on his CT.  Past Medical History:  Diagnosis Date   Cholelithiases 7/89/3810   Complication of anesthesia 2007   quit breathing with bronchoscopy, had to spend night   History of agent Orange exposure 44-45 yrs ago   History of pneumonia  last 3-4 yrs ago   3 -4 different times   Hypercholesterolemia    Hypertension    Hypothyroidism    lung ca dx'd 2007   lung right   Sinus disease    hx of   Sleep apnea    uses cpap setting opf 12  ,  Current Outpatient Medications  Medication Sig Dispense Refill   amitriptyline (ELAVIL) 50 MG tablet Take 50 mg by mouth at bedtime.     aspirin 81 MG tablet Take 81 mg by mouth every morning.      Cholecalciferol 2000 units CAPS Take 2,000 Units by mouth daily.      clindamycin (CLEOCIN T) 1 % external solution APPLY TO AFFECTED AREA(S) TWO TIMES A DAY AS NEEDED FOR SKIN IRRITATION 30 mL 0   cloNIDine (CATAPRES) 0.2 MG tablet Take 0.2 mg by mouth daily.     clotrimazole (LOTRIMIN) 1 % cream Apply 1 Application topically 2 (two) times daily.     erlotinib (TARCEVA) 100 MG tablet Take 1 tablet (100 mg total) by mouth daily. Take on an empty stomach 1 hour before meals or 2 hours after 30 tablet 0   levETIRAcetam (KEPPRA) 750 MG tablet Take 750 mg by mouth at bedtime.      metoprolol  succinate (TOPROL-XL) 25 MG 24 hr tablet Take two tablets (50 mg) by mouth every morning.  Take one tablet (25 mg) by mouth every evening.  Take with or immediately following a meal. 270 tablet 3   Multiple Vitamins-Minerals (CENTRUM SILVER PO) Take 1 tablet by mouth daily.      nitroGLYCERIN (NITROSTAT) 0.4 MG SL tablet PLACE ONE TABLET UNDER THE TONGUE EVERY 5 MINUTES AS NEEDED FOR CHEST PAIN 25 tablet 10   polyethylene glycol (MIRALAX / GLYCOLAX) packet Take 17 g by mouth daily. Reported on 08/27/2015     psyllium (METAMUCIL) 58.6 % powder Take 1 packet by mouth daily. Reported on 08/27/2015     rosuvastatin (CRESTOR) 5 MG tablet Take 5 mg by mouth every morning.      SYNTHROID 112 MCG tablet Take 112 mcg by mouth daily.     valsartan-hydrochlorothiazide (DIOVAN-HCT) 320-25 MG per tablet Take 1 tablet by mouth every morning.      No current facility-administered medications for this visit.    Physical Exam BP (!) 163/92 (BP Location: Left Arm, Patient Position: Sitting, Cuff Size: Large)   Pulse (!) 55   Resp  20   Ht 6' (1.829 m)   Wt 248 lb 11.2 oz (112.8 kg)   SpO2 93% Comment: RA  BMI 33.14 kg/m  76 year old man in no acute distress Alert and oriented x3 with no focal deficits Lungs diminished breath sounds at right base otherwise clear Cardiac regular rate and rhythm No carotid bruits No cervical or supraclavicular adenopathy  Diagnostic Tests: CT CHEST FINDINGS   Cardiovascular: Aortic atherosclerosis. Normal heart size, without pericardial effusion. No central pulmonary embolism, on this non-dedicated study.   Mediastinum/Nodes: No supraclavicular adenopathy. No mediastinal or hilar adenopathy.   Lungs/Pleura: Right hemidiaphragm elevation. No pleural fluid. Right upper lobectomy.   Again identified are right lower lobe pulmonary nodules. Example superiorly at 2.0 x 0.8 cm on 39/6 versus 1.8 x 0.8 cm on the prior exam (when remeasured).   More medially, at 3.0 x  1.4 cm on 41/6 versus 2.3 x 1.3 cm previously (when remeasured).   More caudally and anteriorly at 3.9 x 1.9 cm on 56/6 versus 3.8 x 1.7 cm on the prior exam (when remeasured).   Within the posteromedial right lower lobe at 1.5 x 1.2 cm on 59/6 versus 1.5 x 1.3 cm on the prior exam (when remeasured).   Musculoskeletal: No acute osseous abnormality.   CT ABDOMEN PELVIS FINDINGS   Hepatobiliary: 2.5 cm gallstone.  Normal liver.   Pancreas: Normal, without mass or ductal dilatation.   Spleen: Normal in size, without focal abnormality.   Adrenals/Urinary Tract: Normal adrenal glands. Exophytic upper pole right renal 3.5 cm cyst . In the absence of clinically indicated signs/symptoms require(s) no independent follow-up. Normal left kidney. Normal urinary bladder.   Stomach/Bowel: Normal stomach, without wall thickening. Scattered colonic diverticula. Colonic stool burden suggests constipation. Normal terminal ileum and appendix. Normal small bowel.   Vascular/Lymphatic: Aortic atherosclerosis. No abdominopelvic adenopathy.   Reproductive: Normal prostate.   Other: No significant free fluid. No evidence of omental or peritoneal disease. Tiny fat containing left inguinal hernia.   Musculoskeletal: Lumbosacral spondylosis.   IMPRESSION: 1. Status post right upper lobectomy. 2. Mild enlargement of the majority of right lower lobe lung nodules, consistent with disease progression. 3. No thoracic adenopathy or evidence of metastatic disease in the abdomen/pelvis. 4. Cholelithiasis 5.  Aortic Atherosclerosis (ICD10-I70.0).     Electronically Signed   By: Abigail Miyamoto M.D.   On: 12/21/2021 15:21 I personally reviewed the CT images.  Possible very slight enlargement of couple of the lung nodules.  A medial nodule appears to have increased in size more so than the others.  Impression: [name] is a 76 year old man with a history of hypertension, hyperlipidemia,  hypothyroidism, agent orange, adenocarcinoma of the lung, and sleep apnea.  He had a stage Ia adenocarcinoma treated with a right upper lobectomy in 2007.  He developed a recurrence about 10 years later.  Biopsy showed adenocarcinoma that was EGFR positive.  He has been on Tarceva since then.  Overall he is still doing well.  He did have some growth particular in one of the nodules on his CT.  The others may be slightly larger.  Agree with Dr. Charlann Boxer plan to continue with Tarceva and repeat a scan in about 4 months.  Plan: Follow-up in 4 months after CT with Dr. Thad Ranger, MD [facility] [phone]

## 2022-01-06 DIAGNOSIS — Z23 Encounter for immunization: Secondary | ICD-10-CM | POA: Diagnosis not present

## 2022-01-09 NOTE — Progress Notes (Unsigned)
Cardiology Office Note:    Date:  01/10/2022   ID:  [name], DOB [birth year], MRN [id number]  PCP:  [name], MD  Cardiologist:  Sinclair Grooms, MD   Referring MD: [name], MD   Chief Complaint  Patient presents with   Hypertension   Follow-up    Aortic size Sinus bradycardia and degree AV block    History of Present Illness:    [name] is a 76 y.o. male with a hx of non-small cell lung cancer (adenocarcinoma) 2007, right upper lobectomy 2007, Sleep apnea, essential hypertension, hyperlipidemia, and abnormal aortic diameter.  [name] is doing well.  He denies any chest pain, shortness of breath, or other problems.  I did mention to him that there was a signal of up and normal aortic size on the coronary calcium score in 2021.  Past Medical History:  Diagnosis Date   Cholelithiases 08/04/7739   Complication of anesthesia 2007   quit breathing with bronchoscopy, had to spend night   History of agent Orange exposure 44-45 yrs ago   History of pneumonia  last 3-4 yrs ago   3 -4 different times   Hypercholesterolemia    Hypertension    Hypothyroidism    lung ca dx'd 2007   lung right   Sinus disease    hx of   Sleep apnea    uses cpap setting opf 12    Past Surgical History:  Procedure Laterality Date   BIOPSY  05/21/2018   Procedure: BIOPSY;  Surgeon: Wonda Horner, MD;  Location: [facility];  Service: Endoscopy;;   BIOPSY  05/11/2021   Procedure: BIOPSY;  Surgeon: Otis Brace, MD;  Location: [facility];  Service: Gastroenterology;;   COLONOSCOPY WITH PROPOFOL N/A 03/19/2013   Procedure: COLONOSCOPY WITH PROPOFOL;  Surgeon: Garlan Fair, MD;  Location: [facility];  Service: Endoscopy;  Laterality: N/A;   COLONOSCOPY WITH PROPOFOL N/A 05/11/2021   Procedure: COLONOSCOPY WITH PROPOFOL;  Surgeon: Otis Brace, MD;  Location: [facility];  Service: Gastroenterology;  Laterality: N/A;   Cystourethroscopy, [facility] TURP.   04/16/2010   ESOPHAGOGASTRODUODENOSCOPY (EGD) WITH PROPOFOL N/A 05/21/2018   Procedure: ESOPHAGOGASTRODUODENOSCOPY (EGD) WITH PROPOFOL;  Surgeon: Wonda Horner, MD;  Location: [facility];  Service: Endoscopy;  Laterality: N/A;   NASAL SINUS SURGERY  1987   POLYPECTOMY  05/11/2021   Procedure: POLYPECTOMY;  Surgeon: Otis Brace, MD;  Location: [facility];  Service: Gastroenterology;;   Right video-assisted thoracoscopy, right upper lobectomy and  01/23/2006   The Olympus video bronchoscope was introduced via the right  12/21/2005   TONSILLECTOMY  1957   Torn medial and lateral menisci, left knee.  11/06/2006    Current Medications: Current Meds  Medication Sig   amitriptyline (ELAVIL) 50 MG tablet Take 50 mg by mouth at bedtime.   aspirin 81 MG tablet Take 81 mg by mouth every morning.    Cholecalciferol 2000 units CAPS Take 2,000 Units by mouth daily.    clindamycin (CLEOCIN T) 1 % external solution APPLY TO AFFECTED AREA(S) TWO TIMES A DAY AS NEEDED FOR SKIN IRRITATION   cloNIDine (CATAPRES) 0.2 MG tablet Take 0.2 mg by mouth daily.   clotrimazole (LOTRIMIN) 1 % cream Apply 1 Application topically 2 (two) times daily.   erlotinib (TARCEVA) 100 MG tablet Take 1 tablet (100 mg total) by mouth daily. Take on an empty stomach 1 hour before meals or 2 hours after   levETIRAcetam (KEPPRA) 750 MG tablet Take  750 mg by mouth at bedtime.    metoprolol succinate (TOPROL-XL) 50 MG 24 hr tablet Take 1 tablet (50 mg total) by mouth daily. Take with or immediately following a meal.   Multiple Vitamins-Minerals (CENTRUM SILVER PO) Take 1 tablet by mouth daily.    nitroGLYCERIN (NITROSTAT) 0.4 MG SL tablet PLACE ONE TABLET UNDER THE TONGUE EVERY 5 MINUTES AS NEEDED FOR CHEST PAIN   polyethylene glycol (MIRALAX / GLYCOLAX) packet Take 17 g by mouth daily. Reported on 08/27/2015   psyllium (METAMUCIL) 58.6 % powder Take 1 packet by mouth daily. Reported on 08/27/2015   rosuvastatin (CRESTOR) 5 MG  tablet Take 5 mg by mouth every morning.    SYNTHROID 112 MCG tablet Take 112 mcg by mouth daily.   valsartan-hydrochlorothiazide (DIOVAN-HCT) 320-25 MG per tablet Take 1 tablet by mouth every morning.    [DISCONTINUED] metoprolol succinate (TOPROL-XL) 25 MG 24 hr tablet Take two tablets (50 mg) by mouth every morning.  Take one tablet (25 mg) by mouth every evening.  Take with or immediately following a meal.     Allergies:   Lisinopril and Prednisone   Social History   Socioeconomic History   Marital status: Married    Spouse name: Not on file   Number of children: 0   Years of education: Not on file   Highest education level: Not on file  Occupational History   Occupation: Administrator    Comment: Retired  Tobacco Use   Smoking status: Never   Smokeless tobacco: Never  Vaping Use   Vaping Use: Never used  Substance and Sexual Activity   Alcohol use: No    Alcohol/week: 0.0 standard drinks of alcohol   Drug use: No   Sexual activity: Not Currently  Other Topics Concern   Not on file  Social History Narrative   Not on file   Social Determinants of Health   Financial Resource Strain: Not on file  Food Insecurity: Not on file  Transportation Needs: Not on file  Physical Activity: Not on file  Stress: Not on file  Social Connections: Not on file     Family History: The patient's family history includes Heart attack in his brother and father; Hypertension in his mother; Varicose Veins in his mother and sister.  ROS:   Please see the history of present illness.    No complaints all other systems reviewed and are negative.  EKGs/Labs/Other Studies Reviewed:    The following studies were reviewed today:  COR CTA 2021:  IMPRESSION: 1. Coronary calcium score of 0. This was 0 percentile for age and sex matched control.   2. Normal coronary origin with right dominance.   3. No evidence of CAD; CADRADS-0.   4. 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     EKG:  EKG sinus bradycardia, first-degree AV block, and when compared to July 2022, the heart heart rate is slightly slower.  Recent Labs: 12/20/2021: ALT 31; BUN 19; Creatinine 1.03; Hemoglobin 13.6; Platelet Count 178; Potassium 4.2; Sodium 139  Recent Lipid Panel No results found for: "CHOL", "TRIG", "HDL", "CHOLHDL", "VLDL", "LDLCALC", "LDLDIRECT"  Physical Exam:    VS:  BP (!) 152/88   Pulse (!) 55   Ht 6' (1.829 m)   Wt 244 lb 6.4 oz (110.9 kg)   SpO2 99%   BMI 33.15 kg/m     Wt Readings from Last 3 Encounters:  01/10/22 244 lb 6.4 oz (110.9 kg)  01/04/22 248 lb 11.2 oz (112.8  kg)  12/23/21 246 lb 8 oz (111.8 kg)     GEN: Overweight. No acute distress HEENT: Normal NECK: No JVD. LYMPHATICS: No lymphadenopathy CARDIAC: No murmur. RRR no gallop, or edema. VASCULAR:  Normal Pulses. No bruits. RESPIRATORY:  Clear to auscultation without rales, wheezing or rhonchi  ABDOMEN: Soft, non-tender, non-distended, No pulsatile mass, MUSCULOSKELETAL: No deformity  SKIN: Warm and dry NEUROLOGIC:  Alert and oriented x 3 PSYCHIATRIC:  Normal affect   ASSESSMENT:    1. Angina pectoris ([place])   2. Primary hypertension   3. Hyperlipidemia LDL goal <70   4. Sleep apnea, unspecified type   5. First degree AV block   6. Malignant neoplasm of lower lobe of right lung ([place])   7. Sinus bradycardia    PLAN:    In order of problems listed above:  No angina.  0 calcium score to years ago. Blood pressure target 130/80.  Elevated this morning as he has not yet had any medication.  He uses a Catapres tablet 0.2 mg daily along with Toprol-XL 50 a.m. and 25 mg p.m., and Diovan HCT 320/25 mg.  He has had none of his medications yet today.  The dose of atenolol will be changed, see below. Continue Crestor 5 mg/day.  Coronary calcium score was 0.  Most recent LDL cholesterol was 63 in September.  No changes required. Compliant with CPAP. Today he has sinus bradycardia and first-degree AV  block and is on 75 mg of Toprol-XL.  Decrease Toprol-XL to 50 mg/day.  Monitor blood pressure.  Maintain blood pressure less than 140/90 mmHg.  He will notify us if blood pressures are running too high at which time we will likely add amlodipine. 2D Doppler echocardiogram to follow-up on upper limit of normal aortic diameter.   Medication Adjustments/Labs and Tests Ordered: Current medicines are reviewed at length with the patient today.  Concerns regarding medicines are outlined above.  Orders Placed This Encounter  Procedures   EKG 12-Lead   ECHOCARDIOGRAM COMPLETE   Meds ordered this encounter  Medications   metoprolol succinate (TOPROL-XL) 50 MG 24 hr tablet    Sig: Take 1 tablet (50 mg total) by mouth daily. Take with or immediately following a meal.    Dispense:  90 tablet    Refill:  3    Dose change.    Patient Instructions  Medication Instructions:  Your physician has recommended you make the following change in your medication:   1) DECREASE metoprolol succinate (Toprol XL) to 50mg  once daily  *If you need a refill on your cardiac medications before your next appointment, please call your pharmacy*  Lab Work: NONE  Testing/Procedures: Your physician has requested that you have an echocardiogram. Echocardiography is a painless test that uses sound waves to create images of your heart. It provides your doctor with information about the size and shape of your heart and how well your heart's chambers and valves are working. This procedure takes approximately one hour. There are no restrictions for this procedure.  Follow-Up: At [facility], you and your health needs are our priority.  As part of our continuing mission to provide you with exceptional heart care, we have created designated Provider Care Teams.  These Care Teams include your primary Cardiologist (physician) and Advanced Practice Providers (APPs -  Physician Assistants and Nurse Practitioners) who all  work together to provide you with the care you need, when you need it.  Your next appointment:   1 year(s)  The format for your next appointment:   In Person  Provider:   Sinclair Grooms, MD   Important Information About Sugar         Signed, Sinclair Grooms, MD  01/10/2022 9:33 AM    [name]

## 2022-01-10 ENCOUNTER — Encounter: Payer: Self-pay | Admitting: Interventional Cardiology

## 2022-01-10 ENCOUNTER — Ambulatory Visit: Payer: Medicare Other | Attending: Interventional Cardiology | Admitting: Interventional Cardiology

## 2022-01-10 VITALS — BP 152/88 | HR 55 | Ht 72.0 in | Wt 244.4 lb

## 2022-01-10 DIAGNOSIS — E785 Hyperlipidemia, unspecified: Secondary | ICD-10-CM

## 2022-01-10 DIAGNOSIS — I10 Essential (primary) hypertension: Secondary | ICD-10-CM

## 2022-01-10 DIAGNOSIS — I440 Atrioventricular block, first degree: Secondary | ICD-10-CM | POA: Diagnosis not present

## 2022-01-10 DIAGNOSIS — C3431 Malignant neoplasm of lower lobe, right bronchus or lung: Secondary | ICD-10-CM | POA: Diagnosis not present

## 2022-01-10 DIAGNOSIS — G4730 Sleep apnea, unspecified: Secondary | ICD-10-CM | POA: Diagnosis not present

## 2022-01-10 DIAGNOSIS — I209 Angina pectoris, unspecified: Secondary | ICD-10-CM | POA: Diagnosis not present

## 2022-01-10 DIAGNOSIS — R001 Bradycardia, unspecified: Secondary | ICD-10-CM

## 2022-01-10 MED ORDER — METOPROLOL SUCCINATE ER 50 MG PO TB24: 50.0000 mg | ORAL_TABLET | Freq: Every day | ORAL | 3 refills | Status: DC

## 2022-01-10 NOTE — Patient Instructions (Addendum)
Medication Instructions:  Your physician has recommended you make the following change in your medication:   1) DECREASE metoprolol succinate (Toprol XL) to 50mg  once daily  *If you need a refill on your cardiac medications before your next appointment, please call your pharmacy*  Lab Work: NONE  Testing/Procedures: Your physician has requested that you have an echocardiogram. Echocardiography is a painless test that uses sound waves to create images of your heart. It provides your doctor with information about the size and shape of your heart and how well your heart's chambers and valves are working. This procedure takes approximately one hour. There are no restrictions for this procedure.  Follow-Up: At [facility], you and your health needs are our priority.  As part of our continuing mission to provide you with exceptional heart care, we have created designated Provider Care Teams.  These Care Teams include your primary Cardiologist (physician) and Advanced Practice Providers (APPs -  Physician Assistants and Nurse Practitioners) who all work together to provide you with the care you need, when you need it.  Your next appointment:   1 year(s)  The format for your next appointment:   In Person  Provider:   Sinclair Grooms, MD   Important Information About Sugar

## 2022-01-11 ENCOUNTER — Other Ambulatory Visit: Payer: Self-pay | Admitting: Interventional Cardiology

## 2022-01-13 ENCOUNTER — Other Ambulatory Visit (HOSPITAL_COMMUNITY): Payer: Self-pay

## 2022-01-15 ENCOUNTER — Encounter (HOSPITAL_COMMUNITY): Payer: Self-pay

## 2022-01-15 ENCOUNTER — Other Ambulatory Visit: Payer: Self-pay

## 2022-01-15 ENCOUNTER — Emergency Department (HOSPITAL_COMMUNITY)
Admission: EM | Admit: 2022-01-15 | Discharge: 2022-01-15 | Disposition: A | Discharge status: Home / Self Care | Payer: Medicare Other | Attending: Emergency Medicine | Admitting: Emergency Medicine

## 2022-01-15 DIAGNOSIS — Z79899 Other long term (current) drug therapy: Secondary | ICD-10-CM | POA: Insufficient documentation

## 2022-01-15 DIAGNOSIS — I10 Essential (primary) hypertension: Secondary | ICD-10-CM | POA: Diagnosis not present

## 2022-01-15 DIAGNOSIS — R040 Epistaxis: Secondary | ICD-10-CM | POA: Diagnosis not present

## 2022-01-15 DIAGNOSIS — Z85118 Personal history of other malignant neoplasm of bronchus and lung: Secondary | ICD-10-CM | POA: Insufficient documentation

## 2022-01-15 DIAGNOSIS — Z7982 Long term (current) use of aspirin: Secondary | ICD-10-CM | POA: Insufficient documentation

## 2022-01-15 LAB — CBC
HCT: 40.4 % (ref 39.0–52.0)
Hemoglobin: 13.6 g/dL (ref 13.0–17.0)
MCH: 34.3 pg — ABNORMAL HIGH (ref 26.0–34.0)
MCHC: 33.7 g/dL (ref 30.0–36.0)
MCV: 102 fL — ABNORMAL HIGH (ref 80.0–100.0)
Platelets: 191 10*3/uL (ref 150–400)
RBC: 3.96 MIL/uL — ABNORMAL LOW (ref 4.22–5.81)
RDW: 12.5 % (ref 11.5–15.5)
WBC: 5.3 10*3/uL (ref 4.0–10.5)
nRBC: 0 % (ref 0.0–0.2)

## 2022-01-15 MED ORDER — CEPHALEXIN 500 MG PO CAPS: 500.0000 mg | ORAL_CAPSULE | Freq: Four times a day (QID) | ORAL | 0 refills | Status: DC

## 2022-01-15 MED ORDER — OXYMETAZOLINE HCL 0.05 % NA SOLN
1.0000 | Freq: Once | NASAL | Status: AC
  Administered 2022-01-15: 1 via NASAL
  Filled 2022-01-15: qty 30

## 2022-01-15 MED ORDER — LIDOCAINE-EPINEPHRINE (PF) 2 %-1:200000 IJ SOLN
10.0000 mL | Freq: Once | INTRAMUSCULAR | Status: AC
  Administered 2022-01-15: 10 mL
  Filled 2022-01-15: qty 20

## 2022-01-15 NOTE — ED Provider Notes (Signed)
[facility] DEPT Provider Note   CSN: [id number] Arrival date & time: 01/15/22  1532     History  Chief Complaint  Patient presents with   Epistaxis    [name] is a 76 y.o. male.   Epistaxis   76 year old male presents emergency department with complaints of nosebleed.  Patient states that he initially had a nosebleed this past Monday that was atraumatic in nature.  He states that he was able to get it to stop at home.  No similar occurrence yesterday.  He did have another episode today approximately 1 hour prior to arrival when he was brushing his teeth.  This afternoon, evaluation was unable to get the nosebleed to stop prompting his visit to the emergency department.  Patient states he has daily aspirin but no other antiplatelet or anticoagulation noted.  Patient has prior history of "sinus surgery in the 80s."  Patient also reports having "uncontrolled blood pressure" over the past few weeks of which has been managed.  Denies history of recurrent nosebleeds prior to episode on Monday.  Denies any known trauma.  Denies dizziness, lightheadedness, chest pain, shortness of breath.  Denies history of bleeding disorder.  Past medical history significant for lung cancer sleep apnea, hypertension, hypercholesterolemia,  Home Medications Prior to Admission medications   Medication Sig Start Date End Date Taking? Authorizing Provider  cephALEXin (KEFLEX) 500 MG capsule Take 1 capsule (500 mg total) by mouth 4 (four) times daily. 01/15/22  Yes Dion Saucier A, PA  amitriptyline (ELAVIL) 50 MG tablet Take 50 mg by mouth at bedtime.    [provider]  aspirin 81 MG tablet Take 81 mg by mouth every morning.  10/03/1983   [provider]  Cholecalciferol 2000 units CAPS Take 2,000 Units by mouth daily.  08/13/14   [name], MD  clindamycin (CLEOCIN T) 1 % external solution APPLY TO AFFECTED AREA(S) TWO TIMES A DAY AS NEEDED FOR SKIN  IRRITATION 04/29/21   Curt Bears, MD  cloNIDine (CATAPRES) 0.2 MG tablet Take 0.2 mg by mouth daily.    [provider]  clotrimazole (LOTRIMIN) 1 % cream Apply 1 Application topically 2 (two) times daily.    [provider]  erlotinib (TARCEVA) 100 MG tablet Take 1 tablet (100 mg total) by mouth daily. Take on an empty stomach 1 hour before meals or 2 hours after 12/14/21 01/28/22  Curt Bears, MD  levETIRAcetam (KEPPRA) 750 MG tablet Take 750 mg by mouth at bedtime.  08/12/14   [provider]  metoprolol succinate (TOPROL-XL) 50 MG 24 hr tablet Take 1 tablet (50 mg total) by mouth daily. Take with or immediately following a meal. 01/10/22   Belva Crome, MD  Multiple Vitamins-Minerals (CENTRUM SILVER PO) Take 1 tablet by mouth daily.  04/05/03   [provider]  nitroGLYCERIN (NITROSTAT) 0.4 MG SL tablet DISSOLVE 1 TAB UNDER TONGUE FOR CHEST PAIN - IF PAIN REMAINS AFTER 5 MIN, CALL 911 AND REPEAT DOSE. MAX 3 TABS IN 15 MINUTES 01/12/22   Belva Crome, MD  polyethylene glycol [facility] / Floria Raveling) packet Take 17 g by mouth daily. Reported on 08/27/2015    [provider]  psyllium (METAMUCIL) 58.6 % powder Take 1 packet by mouth daily. Reported on 08/27/2015    [provider]  rosuvastatin (CRESTOR) 5 MG tablet Take 5 mg by mouth every morning.  05/05/10   [provider]  SYNTHROID 112 MCG tablet Take 112 mcg by  mouth daily. 03/07/16   [provider]  valsartan-hydrochlorothiazide (DIOVAN-HCT) 320-25 MG per tablet Take 1 tablet by mouth every morning.  05/05/10   [provider]      Allergies    Lisinopril and Prednisone    Review of Systems   Review of Systems  HENT:  Positive for nosebleeds.     Physical Exam Updated Vital Signs BP (!) 152/88   Pulse 60   Resp 19   SpO2 99%  Physical Exam Vitals and nursing note reviewed.  Constitutional:      General: He is not in acute distress.    Appearance:  He is well-developed.  HENT:     Head: Normocephalic and atraumatic.     Right Ear: Tympanic membrane normal.     Left Ear: Tympanic membrane normal.     Nose:     Comments: Persistent and profuse bleeding noted from patient's right nare.  Mild bleeding noted from patient's left nare.  Unable to visualize bleeding due to amount of blood. Eyes:     Conjunctiva/sclera: Conjunctivae normal.  Cardiovascular:     Rate and Rhythm: Normal rate and regular rhythm.     Heart sounds: No murmur heard. Pulmonary:     Effort: Pulmonary effort is normal. No respiratory distress.     Breath sounds: Normal breath sounds. No wheezing or rales.  Abdominal:     Palpations: Abdomen is soft.     Tenderness: There is no abdominal tenderness.  Musculoskeletal:        General: No swelling.     Cervical back: Neck supple.  Skin:    General: Skin is warm and dry.     Capillary Refill: Capillary refill takes less than 2 seconds.  Neurological:     Mental Status: He is alert.  Psychiatric:        Mood and Affect: Mood normal.     ED Results / Procedures / Treatments   Labs (all labs ordered are listed, but only abnormal results are displayed) Labs Reviewed  CBC - Abnormal; Notable for the following components:      Result Value   RBC 3.96 (*)    MCV 102.0 (*)    MCH 34.3 (*)    All other components within normal limits    EKG None  Radiology No results found.  Procedures .Epistaxis Management  Date/Time: 01/15/2022 4:58 PM  Performed by: Wilnette Kales, PA Authorized by: Wilnette Kales, PA   Consent:    Consent obtained:  Verbal   Consent given by:  Patient   Risks, benefits, and alternatives were discussed: yes     Risks discussed:  Bleeding, infection, nasal injury and pain   Alternatives discussed:  No treatment, delayed treatment, alternative treatment, observation and referral Universal protocol:    Procedure explained and questions answered to patient or proxy's  satisfaction: yes     Patient identity confirmed:  Verbally with patient Anesthesia:    Anesthesia method:  None Procedure details:    Treatment site:  R anterior   Treatment method:  Anterior pack   Treatment complexity:  Extensive   Treatment episode: initial   Post-procedure details:    Assessment:  No improvement   Procedure completion:  Tolerated .Epistaxis Management  Date/Time: 01/15/2022 4:59 PM  Performed by: Wilnette Kales, PA Authorized by: Wilnette Kales, PA   Consent:    Consent obtained:  Verbal   Consent given by:  Patient   Risks, benefits, and alternatives  were discussed: yes     Risks discussed:  Bleeding, infection, nasal injury and pain   Alternatives discussed:  No treatment, delayed treatment, observation, alternative treatment and referral Universal protocol:    Procedure explained and questions answered to patient or proxy's satisfaction: yes     Patient identity confirmed:  Verbally with patient Anesthesia:    Anesthesia method:  Topical application   Topical anesthetic:  Epinephrine Procedure details:    Treatment site:  R anterior and R posterior   Treatment method:  Nasal tampon   Treatment complexity:  Extensive   Treatment episode: recurring   Post-procedure details:    Assessment:  No improvement   Procedure completion:  Tolerated well, no immediate complications .Critical Care  Performed by: Wilnette Kales, PA Authorized by: Wilnette Kales, PA   Critical care provider statement:    Critical care time (minutes):  35   Critical care was necessary to treat or prevent imminent or life-threatening deterioration of the following conditions:  Circulatory failure   Critical care was time spent personally by me on the following activities:  Development of treatment plan with patient or surrogate, discussions with consultants, evaluation of patient's response to treatment, examination of patient, ordering and review of laboratory studies,  ordering and review of radiographic studies, ordering and performing treatments and interventions, pulse oximetry, re-evaluation of patient's condition and review of old charts   I assumed direction of critical care for this patient from another provider in my specialty: no     Patient's right anterior posterior nasal packing performed by Britini Henderly, PA-C    Medications Ordered in ED Medications  oxymetazoline (AFRIN) 0.05 % nasal spray 1 spray (1 spray Each Nare Given 01/15/22 1611)  lidocaine-EPINEPHrine (XYLOCAINE W/EPI) 2 %-1:200000 (PF) injection 10 mL (10 mLs Infiltration Given 01/15/22 1638)    ED Course/ Medical Decision Making/ A&P Clinical Course as of 01/15/22 1805  Sat Jan 15, 2022  1703 Consulted Dr. Benjamine Mola of ENT regarding the patient.  He agreed to see the patient to determine further disposition. [CR]  1757 After assessment by Dr. Benjamine Mola, recommended keep in place nasal packing with follow-up outpatient Monday.  Antibiotics in the form of Keflex. [CR]    Clinical Course User Index [CR] Wilnette Kales, PA                           Medical Decision Making  This patient presents to the ED for concern of epistaxis, this involves an extensive number of treatment options, and is a complaint that carries with it a high risk of complications and morbidity.  The differential diagnosis includes anterior/posterior epistaxis, coagulopathy   Co morbidities that complicate the patient evaluation  See HPI   Additional history obtained:  Additional history obtained from EMR External records from outside source obtained and reviewed including hospital records   Lab Tests:  I Ordered, and personally interpreted labs.  The pertinent results include: CBC without evidence of anemia.  No leukocytosis noted.  Platelets within normal limits.   Imaging Studies ordered:  N/a   Cardiac Monitoring: / EKG:  The patient was maintained on a cardiac monitor.  I personally  viewed and interpreted the cardiac monitored which showed an underlying rhythm of: Sinus rhythm   Consultations Obtained:  See ED course  Problem List / ED Course / Critical interventions / Medication management  Epistaxis I ordered medication including Afrin/lido with epi for vasoconstriction  Reevaluation of the patient after these medicines showed that the patient improved I have reviewed the patients home medicines and have made adjustments as needed   Social Determinants of Health:  Denies tobacco, illicit drug use   Test / Admission - Considered:  Epistaxis Vitals signs significant for hypertension with a blood pressure of 203/102 initially which decreased with time as well as stopping of bleeding while emergency department to 152/88 upon discharge.. Otherwise within normal range and stable throughout visit. Laboratory studies significant for: See above Patient's epistaxis bilaterally was treated in manner indicated above.  ENT was consulted regarding the patient; see ED course for recommendations.  Patient to be placed on antibiotics outpatient with close follow-up with ENT on Monday.  Treatment plan discussed with patient he is understanding and was agreeable to said plan.   Worrisome signs symptoms were discussed with the patient the patient knowledge understand was agreeable to return to the emergency department if noticed.        Final Clinical Impression(s) / ED Diagnoses Final diagnoses:  Epistaxis    Rx / DC Orders ED Discharge Orders          Ordered    cephALEXin (KEFLEX) 500 MG capsule  4 times daily        01/15/22 1804              Wilnette Kales, [place] 01/15/22 1805    Godfrey Pick, MD 01/19/22 [phone]

## 2022-01-15 NOTE — ED Provider Notes (Signed)
.  Epistaxis Management  Date/Time: 01/15/2022 4:49 PM  Performed by: Nettie Elm, PA-C Authorized by: Nettie Elm, PA-C   Consent:    Consent obtained:  Verbal   Consent given by:  Patient   Risks, benefits, and alternatives were discussed: yes     Risks discussed:  Bleeding, infection, nasal injury and pain   Alternatives discussed:  Referral, observation and no treatment Universal protocol:    Procedure explained and questions answered to patient or proxy's satisfaction: yes     Relevant documents present and verified: yes     Test results available: yes     Imaging studies available: yes     Required blood products, implants, devices, and special equipment available: yes     Site/side marked: yes     Immediately prior to procedure, a time out was called: yes     Patient identity confirmed:  Verbally with patient Anesthesia:    Anesthesia method:  Topical application   Topical anesthetic:  LET Procedure details:    Treatment site:  R anterior and R posterior   Treatment method:  Nasal tampon (rhinorocket)   Treatment complexity:  Extensive   Treatment episode: initial   Post-procedure details:    Assessment:  Bleeding decreased   Procedure completion:  Tolerated well, no immediate complications Comments:     Right side     Patient seen with PA Unk Lightning.  Bleed since 1 hour PTA.  Had episode earlier in the week as well.  He is not anticoagulated.  No blood thinners.  No known head trauma.  Did have prior nasal surgery 1980, not currently followed by ENT.  On exam does have bilateral epistaxis however appears to be brisk bleeding from the right nares.  He is spitting up blood which I suspect due to posterior bleed.  Will attempt packing on the right.   Patient reassessed.  Still has some slight oozing from the right nares.  More brisk bleeding now from the left.  PA Robbins placed packing on the left side.  Patient reassessed, still some slight oozing.  Plan for  PA Unk Lightning to discuss with ENT.  We will get labs.   Onelia Cadmus A, PA-C 01/15/22 1702    Godfrey Pick, MD 01/19/22 [phone]

## 2022-01-15 NOTE — ED Provider Triage Note (Signed)
Emergency Medicine Provider Triage Evaluation Note  [name] , a 76 y.o. male  was evaluated in triage.  Pt complains of epistaxis for 30 minutes.  Reports that he has been having nosebleeds as all week but today it has not stopped.  Has held pressure but no other interventions.  Is not on any blood thinners but takes a baby aspirin.  Says that he has hypertension that has been "out of control."  Also reports sleep apnea and CPAP use at night.  Currently being followed by cancer center for lung cancer  Review of Systems  Positive: Nosebleed Negative: Shortness of breath, palpitations, lightheadedness or dizziness  Physical Exam  BP (!) 203/102 (BP Location: Left Arm)   Pulse 70   Resp 16   SpO2 96%  Gen:   Awake, no distress   Resp:  Normal effort  MSK:   Moves extremities without difficulty  Other:  Constant blood dripping from patient's right nare.  Blood also in the oropharynx.  Bright red.  Unable to note any trauma in the nostrils  Medical Decision Making  Medically screening exam initiated at 3:41 PM.  Appropriate orders placed.  [name] was informed that the remainder of the evaluation will be completed by another provider, this initial triage assessment does not replace that evaluation, and the importance of remaining in the ED until their evaluation is complete.  Hypertensive, needs next room   [name], [place] A, PA-C 01/15/22 1546

## 2022-01-15 NOTE — Consult Note (Signed)
Reason for Consult: Bilateral severe epistaxis  HPI:  [name] is an 76 y.o. male who presents today to the [facility] emergency room complaining of severe epistaxis.  The patient has no previous history of significant epistaxis.  He started experiencing recurrent nosebleeds earlier this week.  His most recent episode started 1 hour prior to his arrival.  He was noted to have bilateral severe epistaxis in the emergency room.  The ER provider has placed bilateral AP packing.  The bleeding has decreased.  The patient previously underwent bilateral sinus surgeries in the 80s.  He denies any recent nasal trauma.  He is not anticoagulated.  Past Medical History:  Diagnosis Date   Cholelithiases 09/28/348   Complication of anesthesia 2007   quit breathing with bronchoscopy, had to spend night   History of agent Orange exposure 44-45 yrs ago   History of pneumonia  last 3-4 yrs ago   3 -4 different times   Hypercholesterolemia    Hypertension    Hypothyroidism    lung ca dx'd 2007   lung right   Sinus disease    hx of   Sleep apnea    uses cpap setting opf 12    Past Surgical History:  Procedure Laterality Date   BIOPSY  05/21/2018   Procedure: BIOPSY;  Surgeon: Wonda Horner, MD;  Location: [facility];  Service: Endoscopy;;   BIOPSY  05/11/2021   Procedure: BIOPSY;  Surgeon: Otis Brace, MD;  Location: [facility];  Service: Gastroenterology;;   COLONOSCOPY WITH PROPOFOL N/A 03/19/2013   Procedure: COLONOSCOPY WITH PROPOFOL;  Surgeon: Garlan Fair, MD;  Location: [facility];  Service: Endoscopy;  Laterality: N/A;   COLONOSCOPY WITH PROPOFOL N/A 05/11/2021   Procedure: COLONOSCOPY WITH PROPOFOL;  Surgeon: Otis Brace, MD;  Location: [facility];  Service: Gastroenterology;  Laterality: N/A;   Cystourethroscopy, [facility] TURP.  04/16/2010   ESOPHAGOGASTRODUODENOSCOPY (EGD) WITH PROPOFOL N/A 05/21/2018   Procedure: ESOPHAGOGASTRODUODENOSCOPY (EGD) WITH PROPOFOL;   Surgeon: Wonda Horner, MD;  Location: [facility];  Service: Endoscopy;  Laterality: N/A;   NASAL SINUS SURGERY  1987   POLYPECTOMY  05/11/2021   Procedure: POLYPECTOMY;  Surgeon: Otis Brace, MD;  Location: [facility];  Service: Gastroenterology;;   Right video-assisted thoracoscopy, right upper lobectomy and  01/23/2006   The Olympus video bronchoscope was introduced via the right  12/21/2005   TONSILLECTOMY  1957   Torn medial and lateral menisci, left knee.  11/06/2006    Family History  Problem Relation Age of Onset   Hypertension Mother    Varicose Veins Mother    Heart attack Father    Heart attack Brother    Varicose Veins Sister     Social History:  reports that he has never smoked. He has never used smokeless tobacco. He reports that he does not drink alcohol and does not use drugs.  Allergies:  Allergies  Allergen Reactions   Lisinopril Cough   Prednisone Rash    Prior to Admission medications   Medication Sig Start Date End Date Taking? Authorizing Provider  amitriptyline (ELAVIL) 50 MG tablet Take 50 mg by mouth at bedtime.    [provider]  aspirin 81 MG tablet Take 81 mg by mouth every morning.  10/03/1983   [provider]  Cholecalciferol 2000 units CAPS Take 2,000 Units by mouth daily.  08/13/14   [name], MD  clindamycin (CLEOCIN T) 1 % external solution APPLY TO AFFECTED AREA(S) TWO TIMES A DAY  AS NEEDED FOR SKIN IRRITATION 04/29/21   Curt Bears, MD  cloNIDine (CATAPRES) 0.2 MG tablet Take 0.2 mg by mouth daily.    [provider]  clotrimazole (LOTRIMIN) 1 % cream Apply 1 Application topically 2 (two) times daily.    [provider]  erlotinib (TARCEVA) 100 MG tablet Take 1 tablet (100 mg total) by mouth daily. Take on an empty stomach 1 hour before meals or 2 hours after 12/14/21 01/28/22  Curt Bears, MD  levETIRAcetam (KEPPRA) 750 MG tablet Take 750 mg by mouth at bedtime.  08/12/14   [provider]  metoprolol succinate (TOPROL-XL) 50 MG 24 hr tablet Take 1 tablet (50 mg total) by mouth daily. Take with or immediately following a meal. 01/10/22   Belva Crome, MD  Multiple Vitamins-Minerals (CENTRUM SILVER PO) Take 1 tablet by mouth daily.  04/05/03   [provider]  nitroGLYCERIN (NITROSTAT) 0.4 MG SL tablet DISSOLVE 1 TAB UNDER TONGUE FOR CHEST PAIN - IF PAIN REMAINS AFTER 5 MIN, CALL 911 AND REPEAT DOSE. MAX 3 TABS IN 15 MINUTES 01/12/22   Belva Crome, MD  polyethylene glycol [facility] / Floria Raveling) packet Take 17 g by mouth daily. Reported on 08/27/2015    [provider]  psyllium (METAMUCIL) 58.6 % powder Take 1 packet by mouth daily. Reported on 08/27/2015    [provider]  rosuvastatin (CRESTOR) 5 MG tablet Take 5 mg by mouth every morning.  05/05/10   [provider]  SYNTHROID 112 MCG tablet Take 112 mcg by mouth daily. 03/07/16   [provider]  valsartan-hydrochlorothiazide (DIOVAN-HCT) 320-25 MG per tablet Take 1 tablet by mouth every morning.  05/05/10   [provider]     Results for orders placed or performed during the hospital encounter of 01/15/22 (from the past 48 hour(s))  CBC     Status: Abnormal   Collection Time: 01/15/22  4:44 PM  Result Value Ref Range   WBC 5.3 4.0 - 10.5 K/uL   RBC 3.96 (L) 4.22 - 5.81 MIL/uL   Hemoglobin 13.6 13.0 - 17.0 g/dL   HCT 40.4 39.0 - 52.0 %   MCV 102.0 (H) 80.0 - 100.0 fL   MCH 34.3 (H) 26.0 - 34.0 pg   MCHC 33.7 30.0 - 36.0 g/dL   RDW 12.5 11.5 - 15.5 %   Platelets 191 150 - 400 K/uL   nRBC 0.0 0.0 - 0.2 %    Comment: Performed at [facility], [place] [address], [place], [place] [address]   *Note: Due to a large number of results and/or encounters for the requested time period, some results have not been displayed. A complete set of results can be found in Results Review.    No results found.   Blood pressure (!) 152/88, pulse 60, resp.  rate 19, SpO2 99 %. General appearance: alert and cooperative Head: Normocephalic, without obvious abnormality, atraumatic Eyes: Pupils are equal, round, reactive to light. Extraocular motion is intact.  Ears: Examination of the ears shows normal auricles and external auditory canals bilaterally.  Nose: Bilateral anterior posterior packings in place.  No active bleeding is noted. Face: Facial examination shows no asymmetry. Palpation of the face elicit no significant tenderness.  Mouth: Oral cavity examination shows no mucosal lacerations.  A small amount of dried clot is noted in the posterior nasopharynx. Neck: Palpation of the neck reveals no lymphadenopathy or mass. The trachea is midline. The thyroid is not significantly enlarged.  Neuro: Cranial nerves 2-12 are all grossly in tact.   Assessment/Plan: Bilateral severe epistaxis. -His bleeding is now controlled with bilateral anterior posterior packing. -We will leave packing in place until Monday. -Gram-positive antibiotic coverage. -The patient will follow-up in my office on Monday.  [place]aha Hughett W Demarcus Thielke 01/15/2022, 5:54 PM

## 2022-01-15 NOTE — ED Triage Notes (Signed)
Patients nose has been bleeding for 45 minutes. Takes a baby aspirin every day. Has not tried anything prior to arrival to stop the bleeding. Not light headed or dizzy.

## 2022-01-15 NOTE — Discharge Instructions (Signed)
Keep the packing in until Monday when Dr. Benjamine Mola will remove in the clinic.  His information is attached to your discharge papers.  He is expecting you by the office on Monday.  Please not hesitate to return to the emergency department the worrisome signs and symptoms we discussed become apparent.

## 2022-01-17 ENCOUNTER — Other Ambulatory Visit (HOSPITAL_COMMUNITY): Payer: Self-pay

## 2022-01-17 DIAGNOSIS — D649 Anemia, unspecified: Secondary | ICD-10-CM | POA: Diagnosis not present

## 2022-01-17 DIAGNOSIS — I10 Essential (primary) hypertension: Secondary | ICD-10-CM | POA: Diagnosis not present

## 2022-01-17 DIAGNOSIS — K219 Gastro-esophageal reflux disease without esophagitis: Secondary | ICD-10-CM | POA: Diagnosis not present

## 2022-01-17 DIAGNOSIS — E039 Hypothyroidism, unspecified: Secondary | ICD-10-CM | POA: Diagnosis not present

## 2022-01-17 DIAGNOSIS — G4700 Insomnia, unspecified: Secondary | ICD-10-CM | POA: Diagnosis not present

## 2022-01-17 DIAGNOSIS — R040 Epistaxis: Secondary | ICD-10-CM | POA: Diagnosis not present

## 2022-01-19 ENCOUNTER — Other Ambulatory Visit (HOSPITAL_COMMUNITY): Payer: Self-pay

## 2022-01-20 ENCOUNTER — Telehealth: Payer: Self-pay | Admitting: *Deleted

## 2022-01-20 NOTE — Telephone Encounter (Signed)
        Patient  visited [name] on 01/15/2022  for nosebleed    Telephone encounter attempt :  1st  A HIPAA compliant voice message was left requesting a return call.  Instructed patient to call back at [phone].  [place] [phone] [address] , [place] [address] Email : [email or web address]

## 2022-01-21 ENCOUNTER — Other Ambulatory Visit (HOSPITAL_COMMUNITY): Payer: Self-pay

## 2022-01-21 ENCOUNTER — Other Ambulatory Visit: Payer: Self-pay | Admitting: Internal Medicine

## 2022-01-21 DIAGNOSIS — C3431 Malignant neoplasm of lower lobe, right bronchus or lung: Secondary | ICD-10-CM

## 2022-01-24 ENCOUNTER — Other Ambulatory Visit (HOSPITAL_COMMUNITY): Payer: Self-pay

## 2022-01-24 ENCOUNTER — Ambulatory Visit (HOSPITAL_COMMUNITY): Payer: Medicare Other | Attending: Cardiology

## 2022-01-24 DIAGNOSIS — I10 Essential (primary) hypertension: Secondary | ICD-10-CM | POA: Diagnosis not present

## 2022-01-24 DIAGNOSIS — G43909 Migraine, unspecified, not intractable, without status migrainosus: Secondary | ICD-10-CM | POA: Diagnosis not present

## 2022-01-24 DIAGNOSIS — C3490 Malignant neoplasm of unspecified part of unspecified bronchus or lung: Secondary | ICD-10-CM | POA: Diagnosis not present

## 2022-01-24 DIAGNOSIS — I700 Atherosclerosis of aorta: Secondary | ICD-10-CM | POA: Diagnosis not present

## 2022-01-24 DIAGNOSIS — R040 Epistaxis: Secondary | ICD-10-CM | POA: Diagnosis not present

## 2022-01-24 LAB — ECHOCARDIOGRAM COMPLETE
Area-P 1/2: 2.62 cm2
S' Lateral: 3 cm

## 2022-01-24 MED ORDER — ERLOTINIB HCL 100 MG PO TABS
100.0000 mg | ORAL_TABLET | Freq: Every day | ORAL | 0 refills | Status: DC
  Filled 2022-01-24: qty 30, 30d supply, fill #0

## 2022-01-26 ENCOUNTER — Other Ambulatory Visit (HOSPITAL_COMMUNITY): Payer: Self-pay

## 2022-02-14 ENCOUNTER — Telehealth: Payer: Self-pay | Admitting: Internal Medicine

## 2022-02-14 NOTE — Telephone Encounter (Signed)
Rescheduled 11/29 appointment to 11/22. Called patient and left a voicemail.

## 2022-02-16 ENCOUNTER — Other Ambulatory Visit (HOSPITAL_COMMUNITY): Payer: Self-pay

## 2022-02-16 DIAGNOSIS — R040 Epistaxis: Secondary | ICD-10-CM | POA: Diagnosis not present

## 2022-02-18 ENCOUNTER — Other Ambulatory Visit (HOSPITAL_COMMUNITY): Payer: Self-pay

## 2022-02-18 ENCOUNTER — Other Ambulatory Visit: Payer: Self-pay | Admitting: Internal Medicine

## 2022-02-18 DIAGNOSIS — C3431 Malignant neoplasm of lower lobe, right bronchus or lung: Secondary | ICD-10-CM

## 2022-02-18 MED ORDER — ERLOTINIB HCL 100 MG PO TABS
100.0000 mg | ORAL_TABLET | Freq: Every day | ORAL | 0 refills | Status: DC
  Filled 2022-02-18: qty 30, 30d supply, fill #0

## 2022-02-23 ENCOUNTER — Other Ambulatory Visit (HOSPITAL_COMMUNITY): Payer: Self-pay

## 2022-02-23 ENCOUNTER — Inpatient Hospital Stay: Payer: Medicare Other | Attending: Internal Medicine

## 2022-02-23 ENCOUNTER — Inpatient Hospital Stay (HOSPITAL_BASED_OUTPATIENT_CLINIC_OR_DEPARTMENT_OTHER): Payer: Medicare Other | Admitting: Internal Medicine

## 2022-02-23 VITALS — BP 150/76 | HR 72 | Temp 97.8°F | Resp 17 | Wt 245.9 lb

## 2022-02-23 DIAGNOSIS — C3431 Malignant neoplasm of lower lobe, right bronchus or lung: Secondary | ICD-10-CM

## 2022-02-23 DIAGNOSIS — Z79899 Other long term (current) drug therapy: Secondary | ICD-10-CM | POA: Insufficient documentation

## 2022-02-23 DIAGNOSIS — C3411 Malignant neoplasm of upper lobe, right bronchus or lung: Secondary | ICD-10-CM | POA: Insufficient documentation

## 2022-02-23 DIAGNOSIS — C3490 Malignant neoplasm of unspecified part of unspecified bronchus or lung: Secondary | ICD-10-CM

## 2022-02-23 LAB — CBC WITH DIFFERENTIAL (CANCER CENTER ONLY)
Abs Immature Granulocytes: 0.01 10*3/uL (ref 0.00–0.07)
Basophils Absolute: 0 10*3/uL (ref 0.0–0.1)
Basophils Relative: 0 %
Eosinophils Absolute: 0.2 10*3/uL (ref 0.0–0.5)
Eosinophils Relative: 5 %
HCT: 33.8 % — ABNORMAL LOW (ref 39.0–52.0)
Hemoglobin: 11.8 g/dL — ABNORMAL LOW (ref 13.0–17.0)
Immature Granulocytes: 0 %
Lymphocytes Relative: 40 %
Lymphs Abs: 1.9 10*3/uL (ref 0.7–4.0)
MCH: 35 pg — ABNORMAL HIGH (ref 26.0–34.0)
MCHC: 34.9 g/dL (ref 30.0–36.0)
MCV: 100.3 fL — ABNORMAL HIGH (ref 80.0–100.0)
Monocytes Absolute: 0.6 10*3/uL (ref 0.1–1.0)
Monocytes Relative: 12 %
Neutro Abs: 2 10*3/uL (ref 1.7–7.7)
Neutrophils Relative %: 43 %
Platelet Count: 181 10*3/uL (ref 150–400)
RBC: 3.37 MIL/uL — ABNORMAL LOW (ref 4.22–5.81)
RDW: 12.7 % (ref 11.5–15.5)
WBC Count: 4.7 10*3/uL (ref 4.0–10.5)
nRBC: 0 % (ref 0.0–0.2)

## 2022-02-23 LAB — CMP (CANCER CENTER ONLY)
ALT: 32 U/L (ref 0–44)
AST: 37 U/L (ref 15–41)
Albumin: 3.8 g/dL (ref 3.5–5.0)
Alkaline Phosphatase: 57 U/L (ref 38–126)
Anion gap: 5 (ref 5–15)
BUN: 16 mg/dL (ref 8–23)
CO2: 30 mmol/L (ref 22–32)
Calcium: 9.4 mg/dL (ref 8.9–10.3)
Chloride: 101 mmol/L (ref 98–111)
Creatinine: 1.06 mg/dL (ref 0.61–1.24)
GFR, Estimated: >60 mL/min (ref >60)
Glucose, Bld: 107 mg/dL — ABNORMAL HIGH (ref 70–99)
Potassium: 3.7 mmol/L (ref 3.5–5.1)
Sodium: 136 mmol/L (ref 135–145)
Total Bilirubin: 0.6 mg/dL (ref 0.3–1.2)
Total Protein: 6.5 g/dL (ref 6.5–8.1)

## 2022-02-23 NOTE — Progress Notes (Signed)
[facility] [phone]   [phone]  OFFICE PROGRESS NOTE  [name], MD 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  PRIOR THERAPY: 1) Status post right upper lobectomy under the care of Dr. Roxan Hockey on 01/23/2006.  2) Tarceva 150 mg by mouth daily as part of the BMS checkmate 370 clinical trial. Status post 6 weeks of treatment.  CURRENT THERAPY: Tarceva 100 mg by mouth daily as part of the BMS Checkmate 370 clinical trial. First dose started 07/17/2014 status post 91 months of treatment.  INTERVAL HISTORY: [name] 76 y.o. male returns to the clinic today for follow-up visit.  The patient is feeling fine today with no concerning complaints except for occasional diarrhea.  He denied having any chest pain, shortness of breath, cough or hemoptysis.  He has no nausea, vomiting, or constipation.  He denied having any significant weight loss or night sweats.  He continues to tolerate his treatment with Tarceva fairly well.  The patient is here today for evaluation and repeat blood work.  MEDICAL HISTORY: Past Medical History:  Diagnosis Date   Cholelithiases 05/16/9415   Complication of anesthesia 2007   quit breathing with bronchoscopy, had to spend night   History of agent Orange exposure 44-45 yrs ago   History of pneumonia  last 3-4 yrs ago   3 -4 different times   Hypercholesterolemia    Hypertension    Hypothyroidism    lung ca dx'd 2007   lung right   Sinus disease    hx of   Sleep apnea    uses cpap setting opf 12    ALLERGIES:  is allergic to lisinopril and prednisone.  MEDICATIONS:  Current Outpatient Medications  Medication Sig Dispense Refill   amitriptyline (ELAVIL) 50 MG tablet Take 50 mg by mouth at bedtime.     aspirin 81  MG tablet Take 81 mg by mouth every morning.      cephALEXin (KEFLEX) 500 MG capsule Take 1 capsule (500 mg total) by mouth 4 (four) times daily. 20 capsule 0   Cholecalciferol 2000 units CAPS Take 2,000 Units by mouth daily.      clindamycin (CLEOCIN T) 1 % external solution APPLY TO AFFECTED AREA(S) TWO TIMES A DAY AS NEEDED FOR SKIN IRRITATION 30 mL 0   cloNIDine (CATAPRES) 0.2 MG tablet Take 0.2 mg by mouth daily.     clotrimazole (LOTRIMIN) 1 % cream Apply 1 Application topically 2 (two) times daily.     erlotinib (TARCEVA) 100 MG tablet Take 1 tablet (100 mg total) by mouth daily. Take on an empty stomach 1 hour before meals or 2 hours after 30 tablet 0   levETIRAcetam (KEPPRA) 750 MG tablet Take 750 mg by mouth at bedtime.      metoprolol succinate (TOPROL-XL) 50 MG 24 hr tablet Take 1 tablet (50 mg total) by mouth daily. Take with or immediately following a meal. 90 tablet 3   Multiple Vitamins-Minerals (CENTRUM SILVER PO) Take 1 tablet by mouth daily.      nitroGLYCERIN (NITROSTAT) 0.4 MG SL tablet DISSOLVE 1 TAB UNDER TONGUE FOR CHEST PAIN - IF PAIN REMAINS AFTER 5 MIN, CALL 911 AND REPEAT DOSE. MAX 3 TABS IN 15 MINUTES 25 tablet 10   polyethylene glycol (MIRALAX / GLYCOLAX) packet  Take 17 g by mouth daily. Reported on 08/27/2015     psyllium (METAMUCIL) 58.6 % powder Take 1 packet by mouth daily. Reported on 08/27/2015     rosuvastatin (CRESTOR) 5 MG tablet Take 5 mg by mouth every morning.      SYNTHROID 112 MCG tablet Take 112 mcg by mouth daily.     valsartan-hydrochlorothiazide (DIOVAN-HCT) 320-25 MG per tablet Take 1 tablet by mouth every morning.      No current facility-administered medications for this visit.    SURGICAL HISTORY:  Past Surgical History:  Procedure Laterality Date   BIOPSY  05/21/2018   Procedure: BIOPSY;  Surgeon: Wonda Horner, MD;  Location: [facility];  Service: Endoscopy;;   BIOPSY  05/11/2021   Procedure: BIOPSY;  Surgeon: Otis Brace, MD;   Location: [facility];  Service: Gastroenterology;;   COLONOSCOPY WITH PROPOFOL N/A 03/19/2013   Procedure: COLONOSCOPY WITH PROPOFOL;  Surgeon: Garlan Fair, MD;  Location: [facility];  Service: Endoscopy;  Laterality: N/A;   COLONOSCOPY WITH PROPOFOL N/A 05/11/2021   Procedure: COLONOSCOPY WITH PROPOFOL;  Surgeon: Otis Brace, MD;  Location: [facility];  Service: Gastroenterology;  Laterality: N/A;   Cystourethroscopy, [facility] T[facility].  04/16/2010   ESOPHAGOGASTRODUODENOSCOPY (EGD) WITH PROPOFOL N/A 05/21/2018   Procedure: ESOPHAGOGASTRODUODENOSCOPY (EGD) WITH PROPOFOL;  Surgeon: Wonda Horner, MD;  Location: [facility];  Service: Endoscopy;  Laterality: N/A;   NASAL SINUS SURGERY  1987   POLYPECTOMY  05/11/2021   Procedure: POLYPECTOMY;  Surgeon: Otis Brace, MD;  Location: [facility];  Service: Gastroenterology;;   Right video-assisted thoracoscopy, right upper lobectomy and  01/23/2006   The Olympus video bronchoscope was introduced via the right  12/21/2005   TONSILLECTOMY  1957   Torn medial and lateral menisci, left knee.  11/06/2006    REVIEW OF SYSTEMS:  A comprehensive review of systems was negative except for: Gastrointestinal: positive for diarrhea   PHYSICAL EXAMINATION: General appearance: alert, cooperative, and no distress Head: Normocephalic, without obvious abnormality, atraumatic Neck: no adenopathy, no JVD, supple, symmetrical, trachea midline, and thyroid not enlarged, symmetric, no tenderness/mass/nodules Lymph nodes: Cervical, supraclavicular, and axillary nodes normal. Resp: clear to auscultation bilaterally Back: symmetric, no curvature. ROM normal. No CVA tenderness. Cardio: regular rate and rhythm, S1, S2 normal, no murmur, click, rub or gallop GI: soft, non-tender; bowel sounds normal; no masses,  no organomegaly Extremities: extremities normal, atraumatic, no cyanosis or edema  ECOG PERFORMANCE STATUS: 0 - Asymptomatic  Blood pressure (!)  150/76, pulse 72, temperature 97.8 F (36.6 C), temperature source Oral, resp. rate 17, weight 245 lb 14.4 oz (111.5 kg), SpO2 94 %.  LABORATORY DATA: Lab Results  Component Value Date   WBC 4.7 02/23/2022   HGB 11.8 (L) 02/23/2022   HCT 33.8 (L) 02/23/2022   MCV 100.3 (H) 02/23/2022   PLT 181 02/23/2022      Chemistry      Component Value Date/Time   NA 139 12/20/2021 0904   NA 141 03/11/2019 0959   NA 139 04/06/2017 0932   K 4.2 12/20/2021 0904   K 3.8 04/06/2017 0932   CL 103 12/20/2021 0904   CL 102 04/24/2012 0853   CO2 30 12/20/2021 0904   CO2 29 04/06/2017 0932   BUN 19 12/20/2021 0904   BUN 18 03/11/2019 0959   BUN 14.4 04/06/2017 0932   CREATININE 1.03 12/20/2021 0904   CREATININE 1.0 04/06/2017 0932      Component Value Date/Time   CALCIUM 9.8 12/20/2021 0904  CALCIUM 9.5 04/06/2017 0932   ALKPHOS 62 12/20/2021 0904   ALKPHOS 58 04/06/2017 0932   AST 34 12/20/2021 0904   AST 27 04/06/2017 0932   ALT 31 12/20/2021 0904   ALT 27 04/06/2017 0932   BILITOT 0.8 12/20/2021 0904   BILITOT 0.45 04/06/2017 0932       RADIOGRAPHIC STUDIES: ECHOCARDIOGRAM COMPLETE  Result Date: 01/24/2022    ECHOCARDIOGRAM REPORT   Patient Name:   [name] Date of Exam: 01/24/2022 Medical Rec #:  [id number]       Height:       72.0 in Accession #:    [id number]      Weight:       244.4 lb Date of Birth:  12/31/1945       BSA:          2.320 m Patient Age:    32 years        BP:           152/88 mmHg Patient Gender: M               HR:           67 bpm. Exam Location:  [facility] Procedure: 2D Echo, Cardiac Doppler and Color Doppler Indications:    I10 Hypertension  History:        Patient has no prior history of Echocardiogram examinations.                 Risk Factors:Hypertension and Dyslipidemia.  Sonographer:    Coralyn Helling RDCS Referring Phys: [place]  1. Left ventricular ejection fraction, by estimation, is 60 to 65%. The left ventricle has  normal function. The left ventricle has no regional wall motion abnormalities. There is mild left ventricular hypertrophy. Left ventricular diastolic parameters are indeterminate.  2. Right ventricular systolic function is normal. The right ventricular size is normal.  3. Left atrial size was mildly dilated.  4. The mitral valve is normal in structure. No evidence of mitral valve regurgitation. No evidence of mitral stenosis.  5. The aortic valve was not well visualized. Aortic valve regurgitation is not visualized. No aortic stenosis is present. FINDINGS  Left Ventricle: Left ventricular ejection fraction, by estimation, is 60 to 65%. The left ventricle has normal function. The left ventricle has no regional wall motion abnormalities. The left ventricular internal cavity size was normal in size. There is  mild left ventricular hypertrophy. Left ventricular diastolic parameters are indeterminate. Right Ventricle: The right ventricular size is normal. No increase in right ventricular wall thickness. Right ventricular systolic function is normal. Left Atrium: Left atrial size was mildly dilated. Right Atrium: Right atrial size was normal in size. Pericardium: There is no evidence of pericardial effusion. Mitral Valve: The mitral valve is normal in structure. No evidence of mitral valve regurgitation. No evidence of mitral valve stenosis. Tricuspid Valve: The tricuspid valve is normal in structure. Tricuspid valve regurgitation is trivial. Aortic Valve: The aortic valve was not well visualized. Aortic valve regurgitation is not visualized. No aortic stenosis is present. Pulmonic Valve: The pulmonic valve was not well visualized. Pulmonic valve regurgitation is not visualized. Aorta: The aortic root and ascending aorta are structurally normal, with no evidence of dilitation. IAS/Shunts: The interatrial septum was not well visualized.  LEFT VENTRICLE PLAX 2D LVIDd:         4.20 cm   Diastology LVIDs:         3.00 cm  LV  e' medial:    8.92 cm/s LV PW:         1.50 cm   LV E/e' medial:  12.9 LV IVS:        1.50 cm   LV e' lateral:   8.92 cm/s LVOT diam:     2.00 cm   LV E/e' lateral: 12.9 LV SV:         85 LV SV Index:   37 LVOT Area:     3.14 cm  RIGHT VENTRICLE             IVC RV S prime:     13.50 cm/s  IVC diam: 1.00 cm TAPSE (M-mode): 2.6 cm RVSP:           31.3 mmHg LEFT ATRIUM              Index        RIGHT ATRIUM           Index LA diam:        4.30 cm  1.85 cm/m   RA Pressure: 3.00 mmHg LA Vol (A2C):   108.0 ml 46.55 ml/m  RA Area:     18.00 cm LA Vol (A4C):   61.6 ml  26.55 ml/m  RA Volume:   47.80 ml  20.60 ml/m LA Biplane Vol: 83.4 ml  35.95 ml/m  AORTIC VALVE LVOT Vmax:   125.00 cm/s LVOT Vmean:  91.500 cm/s LVOT VTI:    0.271 m  AORTA Ao Root diam: 3.90 cm Ao Asc diam:  3.70 cm MITRAL VALVE                TRICUSPID VALVE MV Area (PHT): 2.62 cm     TR Peak grad:   28.3 mmHg MV Decel Time: 289 msec     TR Vmax:        266.00 cm/s MV E velocity: 115.00 cm/s  Estimated RAP:  3.00 mmHg MV A velocity: 110.00 cm/s  RVSP:           31.3 mmHg MV E/A ratio:  1.05                             SHUNTS                             Systemic VTI:  0.27 m                             Systemic Diam: 2.00 cm Oswaldo Milian MD Electronically signed by Oswaldo Milian MD Signature Date/Time: 01/24/2022/4:22:37 PM    Final      ASSESSMENT AND PLAN:  This is a very pleasant 76 years old white male with recurrent non-small cell lung cancer, adenocarcinoma and positive EGFR mutation in exon 21. He is currently on Tarceva 100 mg by mouth daily status post around 91 months of treatment. The patient continues to tolerate his treatment with Tarceva fairly well with no concerning adverse effect except for occasional diarrhea and mild skin rash. His lab work today is unremarkable for any concerning abnormalities. I recommended for him to continue his current treatment with Tarceva with the same dose. I will see him back for  follow-up visit in 2 months for evaluation and repeat blood work and CT scan of the chest, abdomen and pelvis for restaging of  his disease. He was advised to call immediately if he has any other concerning symptoms in the interval.   The patient voices understanding of current disease status and treatment options and is in agreement with the current care plan. All questions were answered. The patient knows to call the clinic with any problems, questions or concerns. We can certainly see the patient much sooner if necessary.  Disclaimer: This note was dictated with voice recognition software. Similar sounding words can inadvertently be transcribed and may not be corrected upon review.

## 2022-03-02 ENCOUNTER — Other Ambulatory Visit: Payer: Medicare Other

## 2022-03-02 ENCOUNTER — Ambulatory Visit: Payer: Medicare Other | Admitting: Internal Medicine

## 2022-03-17 ENCOUNTER — Other Ambulatory Visit (HOSPITAL_COMMUNITY): Payer: Self-pay

## 2022-03-17 ENCOUNTER — Other Ambulatory Visit: Payer: Self-pay | Admitting: Physician Assistant

## 2022-03-17 DIAGNOSIS — C3431 Malignant neoplasm of lower lobe, right bronchus or lung: Secondary | ICD-10-CM

## 2022-03-17 MED ORDER — ERLOTINIB HCL 100 MG PO TABS
100.0000 mg | ORAL_TABLET | Freq: Every day | ORAL | 2 refills | Status: DC
  Filled 2022-03-17: qty 30, 30d supply, fill #0
  Filled 2022-04-28: qty 30, 30d supply, fill #1
  Filled 2022-05-24 – 2022-05-26 (×2): qty 30, 30d supply, fill #2

## 2022-03-18 ENCOUNTER — Other Ambulatory Visit: Payer: Self-pay

## 2022-03-22 ENCOUNTER — Other Ambulatory Visit (HOSPITAL_COMMUNITY): Payer: Self-pay

## 2022-03-22 ENCOUNTER — Other Ambulatory Visit: Payer: Self-pay

## 2022-03-23 ENCOUNTER — Other Ambulatory Visit (HOSPITAL_COMMUNITY): Payer: Self-pay

## 2022-03-24 ENCOUNTER — Other Ambulatory Visit: Payer: Self-pay

## 2022-03-25 ENCOUNTER — Other Ambulatory Visit: Payer: Self-pay

## 2022-04-12 ENCOUNTER — Other Ambulatory Visit (HOSPITAL_COMMUNITY): Payer: Self-pay

## 2022-04-12 ENCOUNTER — Telehealth: Payer: Self-pay | Admitting: Pharmacy Technician

## 2022-04-12 NOTE — Telephone Encounter (Signed)
Oral Oncology Patient Advocate Encounter   Was successful in securing patient an $4,100 grant from [facility] [place] [facility]) to provide copayment coverage for Erlotinib.  This will keep the out of pocket expense at $0.     I have spoken with the patient.    The billing information is as follows and has been shared with [place].   Member ID: [id number] Group ID: [id number] RxBin: [id number] Dates of Eligibility: 04/12/22 through 04/12/23  Fund:  [place], [place] Patient [place] Direct Number: [phone]  Fax: [phone]

## 2022-04-14 ENCOUNTER — Other Ambulatory Visit (HOSPITAL_COMMUNITY): Payer: Self-pay

## 2022-04-15 ENCOUNTER — Other Ambulatory Visit (HOSPITAL_COMMUNITY): Payer: Self-pay

## 2022-04-25 ENCOUNTER — Inpatient Hospital Stay: Payer: Medicare Other | Attending: Internal Medicine

## 2022-04-25 ENCOUNTER — Other Ambulatory Visit: Payer: Self-pay

## 2022-04-25 DIAGNOSIS — I10 Essential (primary) hypertension: Secondary | ICD-10-CM | POA: Diagnosis not present

## 2022-04-25 DIAGNOSIS — C3490 Malignant neoplasm of unspecified part of unspecified bronchus or lung: Secondary | ICD-10-CM

## 2022-04-25 DIAGNOSIS — E039 Hypothyroidism, unspecified: Secondary | ICD-10-CM | POA: Diagnosis not present

## 2022-04-25 DIAGNOSIS — Z902 Acquired absence of lung [part of]: Secondary | ICD-10-CM | POA: Insufficient documentation

## 2022-04-25 DIAGNOSIS — C3411 Malignant neoplasm of upper lobe, right bronchus or lung: Secondary | ICD-10-CM | POA: Insufficient documentation

## 2022-04-25 DIAGNOSIS — Z7982 Long term (current) use of aspirin: Secondary | ICD-10-CM | POA: Insufficient documentation

## 2022-04-25 DIAGNOSIS — Z79899 Other long term (current) drug therapy: Secondary | ICD-10-CM | POA: Insufficient documentation

## 2022-04-25 DIAGNOSIS — E7800 Pure hypercholesterolemia, unspecified: Secondary | ICD-10-CM | POA: Insufficient documentation

## 2022-04-25 LAB — CBC WITH DIFFERENTIAL (CANCER CENTER ONLY)
Abs Immature Granulocytes: 0.01 10*3/uL (ref 0.00–0.07)
Basophils Absolute: 0.1 10*3/uL (ref 0.0–0.1)
Basophils Relative: 1 %
Eosinophils Absolute: 0.6 10*3/uL — ABNORMAL HIGH (ref 0.0–0.5)
Eosinophils Relative: 10 %
HCT: 38.5 % — ABNORMAL LOW (ref 39.0–52.0)
Hemoglobin: 12.7 g/dL — ABNORMAL LOW (ref 13.0–17.0)
Immature Granulocytes: 0 %
Lymphocytes Relative: 46 %
Lymphs Abs: 2.6 10*3/uL (ref 0.7–4.0)
MCH: 34.1 pg — ABNORMAL HIGH (ref 26.0–34.0)
MCHC: 33 g/dL (ref 30.0–36.0)
MCV: 103.5 fL — ABNORMAL HIGH (ref 80.0–100.0)
Monocytes Absolute: 0.6 10*3/uL (ref 0.1–1.0)
Monocytes Relative: 10 %
Neutro Abs: 1.9 10*3/uL (ref 1.7–7.7)
Neutrophils Relative %: 33 %
Platelet Count: 222 10*3/uL (ref 150–400)
RBC: 3.72 MIL/uL — ABNORMAL LOW (ref 4.22–5.81)
RDW: 12.4 % (ref 11.5–15.5)
WBC Count: 5.7 10*3/uL (ref 4.0–10.5)
nRBC: 0 % (ref 0.0–0.2)

## 2022-04-25 LAB — CMP (CANCER CENTER ONLY)
ALT: 28 U/L (ref 0–44)
AST: 33 U/L (ref 15–41)
Albumin: 3.9 g/dL (ref 3.5–5.0)
Alkaline Phosphatase: 62 U/L (ref 38–126)
Anion gap: 3 — ABNORMAL LOW (ref 5–15)
BUN: 16 mg/dL (ref 8–23)
CO2: 31 mmol/L (ref 22–32)
Calcium: 9.6 mg/dL (ref 8.9–10.3)
Chloride: 102 mmol/L (ref 98–111)
Creatinine: 0.95 mg/dL (ref 0.61–1.24)
GFR, Estimated: >60 mL/min (ref >60)
Glucose, Bld: 92 mg/dL (ref 70–99)
Potassium: 3.6 mmol/L (ref 3.5–5.1)
Sodium: 136 mmol/L (ref 135–145)
Total Bilirubin: 0.7 mg/dL (ref 0.3–1.2)
Total Protein: 6 g/dL — ABNORMAL LOW (ref 6.5–8.1)

## 2022-04-26 ENCOUNTER — Ambulatory Visit (HOSPITAL_COMMUNITY)
Admission: RE | Admit: 2022-04-26 | Discharge: 2022-04-26 | Disposition: A | Discharge status: Home / Self Care | Payer: Medicare Other | Source: Ambulatory Visit | Attending: Internal Medicine | Admitting: Internal Medicine

## 2022-04-26 ENCOUNTER — Encounter (HOSPITAL_COMMUNITY): Payer: Self-pay

## 2022-04-26 DIAGNOSIS — C3490 Malignant neoplasm of unspecified part of unspecified bronchus or lung: Secondary | ICD-10-CM | POA: Diagnosis not present

## 2022-04-26 DIAGNOSIS — R918 Other nonspecific abnormal finding of lung field: Secondary | ICD-10-CM | POA: Diagnosis not present

## 2022-04-26 DIAGNOSIS — N281 Cyst of kidney, acquired: Secondary | ICD-10-CM | POA: Diagnosis not present

## 2022-04-26 DIAGNOSIS — K8020 Calculus of gallbladder without cholecystitis without obstruction: Secondary | ICD-10-CM | POA: Diagnosis not present

## 2022-04-26 MED ORDER — SODIUM CHLORIDE (PF) 0.9 % IJ SOLN
INTRAMUSCULAR | Status: AC
  Filled 2022-04-26: qty 50

## 2022-04-26 MED ORDER — IOHEXOL 9 MG/ML PO SOLN
ORAL | Status: AC
  Administered 2022-04-26: 1000 mL via ORAL
  Filled 2022-04-26: qty 1000

## 2022-04-26 MED ORDER — IOHEXOL 300 MG/ML  SOLN
100.0000 mL | Freq: Once | INTRAMUSCULAR | Status: AC | PRN
  Administered 2022-04-26: 100 mL via INTRAVENOUS

## 2022-04-26 MED ORDER — IOHEXOL 9 MG/ML PO SOLN: 1000.0000 mL | ORAL | Status: AC

## 2022-04-27 ENCOUNTER — Other Ambulatory Visit (HOSPITAL_COMMUNITY): Payer: Self-pay

## 2022-04-28 ENCOUNTER — Encounter: Payer: Self-pay | Admitting: Internal Medicine

## 2022-04-28 ENCOUNTER — Other Ambulatory Visit (HOSPITAL_COMMUNITY): Payer: Self-pay

## 2022-04-28 ENCOUNTER — Inpatient Hospital Stay (HOSPITAL_BASED_OUTPATIENT_CLINIC_OR_DEPARTMENT_OTHER): Payer: Medicare Other | Admitting: Internal Medicine

## 2022-04-28 VITALS — BP 142/80 | HR 76 | Temp 97.9°F | Resp 17 | Ht 72.0 in | Wt 245.7 lb

## 2022-04-28 DIAGNOSIS — Z7982 Long term (current) use of aspirin: Secondary | ICD-10-CM | POA: Diagnosis not present

## 2022-04-28 DIAGNOSIS — C3431 Malignant neoplasm of lower lobe, right bronchus or lung: Secondary | ICD-10-CM | POA: Diagnosis not present

## 2022-04-28 DIAGNOSIS — E039 Hypothyroidism, unspecified: Secondary | ICD-10-CM | POA: Diagnosis not present

## 2022-04-28 DIAGNOSIS — C3411 Malignant neoplasm of upper lobe, right bronchus or lung: Secondary | ICD-10-CM | POA: Diagnosis not present

## 2022-04-28 DIAGNOSIS — I10 Essential (primary) hypertension: Secondary | ICD-10-CM | POA: Diagnosis not present

## 2022-04-28 DIAGNOSIS — Z79899 Other long term (current) drug therapy: Secondary | ICD-10-CM | POA: Diagnosis not present

## 2022-04-28 DIAGNOSIS — E7800 Pure hypercholesterolemia, unspecified: Secondary | ICD-10-CM | POA: Diagnosis not present

## 2022-04-28 NOTE — Progress Notes (Signed)
[facility] [phone]   [phone]  OFFICE PROGRESS NOTE  [name], MD 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  PRIOR THERAPY: 1) Status post right upper lobectomy under the care of Dr. Roxan Hockey on 01/23/2006.  2) Tarceva 150 mg by mouth daily as part of the BMS checkmate 370 clinical trial. Status post 6 weeks of treatment.  CURRENT THERAPY: Tarceva 100 mg by mouth daily as part of the BMS Checkmate 370 clinical trial. First dose started 07/17/2014 status post 93 months of treatment.  INTERVAL HISTORY: [name] 77 y.o. male returns to the clinic today for follow-up visit.  The patient is feeling fine today with no concerning complaints except for the dry skin and nail changes from the cold weather as well as Tarceva.  He denied having any current chest pain, shortness of breath, cough or hemoptysis.  He has no nausea, vomiting, diarrhea or constipation.  He has no headache or visual changes.  He has no recent weight loss or night sweats.  He he continues to tolerate his treatment with Tarceva fairly well.  The patient is here today for evaluation and repeat CT scan of the chest, abdomen and pelvis for restaging of his disease.  MEDICAL HISTORY: Past Medical History:  Diagnosis Date   Cholelithiases 04/06/7251   Complication of anesthesia 2007   quit breathing with bronchoscopy, had to spend night   History of agent Orange exposure 44-45 yrs ago   History of pneumonia  last 3-4 yrs ago   3 -4 different times   Hypercholesterolemia    Hypertension    Hypothyroidism    lung ca dx'd 2007   lung right   Sinus disease    hx of   Sleep apnea    uses cpap setting opf 12    ALLERGIES:  is allergic to lisinopril and prednisone.  MEDICATIONS:   Current Outpatient Medications  Medication Sig Dispense Refill   amitriptyline (ELAVIL) 50 MG tablet Take 50 mg by mouth at bedtime.     aspirin 81 MG tablet Take 81 mg by mouth every morning.      cephALEXin (KEFLEX) 500 MG capsule Take 1 capsule (500 mg total) by mouth 4 (four) times daily. 20 capsule 0   Cholecalciferol 2000 units CAPS Take 2,000 Units by mouth daily.      clindamycin (CLEOCIN T) 1 % external solution APPLY TO AFFECTED AREA(S) TWO TIMES A DAY AS NEEDED FOR SKIN IRRITATION 30 mL 0   cloNIDine (CATAPRES) 0.2 MG tablet Take 0.2 mg by mouth daily.     clotrimazole (LOTRIMIN) 1 % cream Apply 1 Application topically 2 (two) times daily.     erlotinib (TARCEVA) 100 MG tablet Take 1 tablet (100 mg total) by mouth daily. Take on an empty stomach 1 hour before meals or 2 hours after 30 tablet 2   levETIRAcetam (KEPPRA) 750 MG tablet Take 750 mg by mouth at bedtime.      metoprolol succinate (TOPROL-XL) 50 MG 24 hr tablet Take 1 tablet (50 mg total) by mouth daily. Take with or immediately following a meal. 90 tablet 3   Multiple Vitamins-Minerals (CENTRUM SILVER PO) Take 1 tablet by mouth daily.      nitroGLYCERIN (NITROSTAT) 0.4 MG SL tablet DISSOLVE 1 TAB UNDER  TONGUE FOR CHEST PAIN - IF PAIN REMAINS AFTER 5 MIN, CALL 911 AND REPEAT DOSE. MAX 3 TABS IN 15 MINUTES 25 tablet 10   polyethylene glycol (MIRALAX / GLYCOLAX) packet Take 17 g by mouth daily. Reported on 08/27/2015     psyllium (METAMUCIL) 58.6 % powder Take 1 packet by mouth daily. Reported on 08/27/2015     rosuvastatin (CRESTOR) 5 MG tablet Take 5 mg by mouth every morning.      SYNTHROID 112 MCG tablet Take 112 mcg by mouth daily.     valsartan-hydrochlorothiazide (DIOVAN-HCT) 320-25 MG per tablet Take 1 tablet by mouth every morning.      No current facility-administered medications for this visit.    SURGICAL HISTORY:  Past Surgical History:  Procedure Laterality Date   BIOPSY  05/21/2018   Procedure: BIOPSY;   Surgeon: Wonda Horner, MD;  Location: [facility];  Service: Endoscopy;;   BIOPSY  05/11/2021   Procedure: BIOPSY;  Surgeon: Otis Brace, MD;  Location: [facility];  Service: Gastroenterology;;   COLONOSCOPY WITH PROPOFOL N/A 03/19/2013   Procedure: COLONOSCOPY WITH PROPOFOL;  Surgeon: Garlan Fair, MD;  Location: [facility];  Service: Endoscopy;  Laterality: N/A;   COLONOSCOPY WITH PROPOFOL N/A 05/11/2021   Procedure: COLONOSCOPY WITH PROPOFOL;  Surgeon: Otis Brace, MD;  Location: [facility];  Service: Gastroenterology;  Laterality: N/A;   Cystourethroscopy, [facility].  04/16/2010   ESOPHAGOGASTRODUODENOSCOPY (EGD) WITH PROPOFOL N/A 05/21/2018   Procedure: ESOPHAGOGASTRODUODENOSCOPY (EGD) WITH PROPOFOL;  Surgeon: Wonda Horner, MD;  Location: [facility];  Service: Endoscopy;  Laterality: N/A;   NASAL SINUS SURGERY  1987   POLYPECTOMY  05/11/2021   Procedure: POLYPECTOMY;  Surgeon: Otis Brace, MD;  Location: [facility];  Service: Gastroenterology;;   Right video-assisted thoracoscopy, right upper lobectomy and  01/23/2006   The Olympus video bronchoscope was introduced via the right  12/21/2005   TONSILLECTOMY  1957   Torn medial and lateral menisci, left knee.  11/06/2006    REVIEW OF SYSTEMS:  Constitutional: negative Eyes: negative Ears, nose, mouth, throat, and face: negative Respiratory: negative Cardiovascular: negative Gastrointestinal: negative Genitourinary:negative Integument/breast: positive for dryness Hematologic/lymphatic: negative Musculoskeletal:negative Neurological: negative Behavioral/Psych: negative Endocrine: negative Allergic/Immunologic: negative   PHYSICAL EXAMINATION: General appearance: alert, cooperative, and no distress Head: Normocephalic, without obvious abnormality, atraumatic Neck: no adenopathy, no JVD, supple, symmetrical, trachea midline, and thyroid not enlarged, symmetric, no tenderness/mass/nodules Lymph nodes:  Cervical, supraclavicular, and axillary nodes normal. Resp: clear to auscultation bilaterally Back: symmetric, no curvature. ROM normal. No CVA tenderness. Cardio: regular rate and rhythm, S1, S2 normal, no murmur, click, rub or gallop GI: soft, non-tender; bowel sounds normal; no masses,  no organomegaly Extremities: extremities normal, atraumatic, no cyanosis or edema Neurologic: Alert and oriented X 3, normal strength and tone. Normal symmetric reflexes. Normal coordination and gait  ECOG PERFORMANCE STATUS: 0 - Asymptomatic  Blood pressure (!) 142/80, pulse 76, temperature 97.9 F (36.6 C), temperature source Temporal, resp. rate 17, height 6' (1.829 m), weight 245 lb 11.2 oz (111.4 kg), SpO2 100 %.  LABORATORY DATA: Lab Results  Component Value Date   WBC 5.7 04/25/2022   HGB 12.7 (L) 04/25/2022   HCT 38.5 (L) 04/25/2022   MCV 103.5 (H) 04/25/2022   PLT 222 04/25/2022      Chemistry      Component Value Date/Time   NA 136 04/25/2022 0851   NA 141 03/11/2019 0959   NA 139 04/06/2017 0932   K 3.6 04/25/2022 6283  K 3.8 04/06/2017 0932   CL 102 04/25/2022 0851   CL 102 04/24/2012 0853   CO2 31 04/25/2022 0851   CO2 29 04/06/2017 0932   BUN 16 04/25/2022 0851   BUN 18 03/11/2019 0959   BUN 14.4 04/06/2017 0932   CREATININE 0.95 04/25/2022 0851   CREATININE 1.0 04/06/2017 0932      Component Value Date/Time   CALCIUM 9.6 04/25/2022 0851   CALCIUM 9.5 04/06/2017 0932   ALKPHOS 62 04/25/2022 0851   ALKPHOS 58 04/06/2017 0932   AST 33 04/25/2022 0851   AST 27 04/06/2017 0932   ALT 28 04/25/2022 0851   ALT 27 04/06/2017 0932   BILITOT 0.7 04/25/2022 0851   BILITOT 0.45 04/06/2017 0932       RADIOGRAPHIC STUDIES: CT Chest W Contrast  Result Date: 04/26/2022 CLINICAL DATA:  Non-small-cell lung cancer staging. * Tracking Code: BO * EXAM: CT CHEST, ABDOMEN, AND PELVIS WITH CONTRAST TECHNIQUE: Multidetector CT imaging of the chest, abdomen and pelvis was performed  following the standard protocol during bolus administration of intravenous contrast. RADIATION DOSE REDUCTION: This exam was performed according to the departmental dose-optimization program which includes automated exposure control, adjustment of the mA and/or kV according to patient size and/or use of iterative reconstruction technique. CONTRAST:  166mL OMNIPAQUE IOHEXOL 300 MG/ML  SOLN COMPARISON:  12/21/2021 and older FINDINGS: CT CHEST FINDINGS Cardiovascular: The heart is nonenlarged. No pericardial effusion. Stable thoracic aorta with minimal atherosclerotic disease. Mediastinum/Nodes: Small thyroid gland. Mildly patulous thoracic esophagus. No specific abnormal lymph node enlargement seen in the axillary regions, hilum. There are some prominent lymph nodes in the mediastinum including subcarinal and anterior to the left main bronchus. Subcarinal node measures 18 by 14 mm on series 2, image 30 and previously 20 x 9 mm, slightly larger. Other nodes elsewhere in the similar location are also slightly larger. Lungs/Pleura: There is some mild linear opacity seen at the left lung base, likely scar or atelectasis. Mild left apical pleural thickening. No left-sided pleural effusion, pneumothorax or consolidation. No dominant left lung nodule. The right lung has volume loss with surgical changes of the right lung hilum from lobectomy. There is scattered peripheral areas of scarring atelectatic changes. No right-sided pleural effusion or pneumothorax. There are some areas of nodularity in the right lung. These lesions will be specifically followed. Area near the hilum and surgical margin today on series 6, image 52 measures 2.4 by 2.2 cm today and previously when measured on the prior in the same fashion is today would have measured 2.2 by 2.0 cm, similar. The area more peripherally in the right lower lobe abutting the lower fissure measuring 4.1 x 1.6 cm today, previously when measured in same fashion on the prior  would have measured 4.2 x 1.7 cm. Similar adjusting for technique. This particular area extends in a confluent fashion more superiorly as seen on coronal image 73 and appears similar. In the axial plane the area more superiorly for example on series 6, image 46 measures 2.2 by 1.1 cm and previously when measured in a similar fashion on the prior would have measured 2.1 x 0.9 cm. This is multilobular and is unchanged overall. No new dominant right-sided lung nodule identified. Few other tiny nodules are stable. There is volume loss in the right hemithorax with elevation of the right hemidiaphragm. Musculoskeletal: Scattered degenerative changes are seen along the spine. CT ABDOMEN PELVIS FINDINGS Hepatobiliary: No enhancing liver lesion. Large stone in the nondilated gallbladder. Patent portal vein.  Pancreas: Preserved pancreatic parenchyma without enhancing mass. Spleen: Spleen is nonenlarged.  Preserved enhancement. Adrenals/Urinary Tract: Adrenal glands are preserved. No mass lesion. Mild nonspecific perinephric stranding. No enhancing renal mass or collecting system filling defect. Ureters grossly normal course and caliber down to the bladder. Preserved contours of the urinary bladder. Stable 3.5 cm Bosniak 1 exophytic right-sided renal cyst extending anterior and superior from the right kidney. Stomach/Bowel: On this non oral contrast exam, the large bowel has a normal course and caliber with scattered colonic stool. Few colonic diverticula. Of note there is a loop of colon extending between the liver in the anterior abdominal wall, atypical distribution of bowel. Normal appendix in the right lower quadrant. The stomach and small bowel are nondilated. Vascular/Lymphatic: Normal caliber aorta and IVC with atherosclerotic changes. No specific abnormal lymph node enlargement seen in the abdomen and pelvis. Reproductive: Prostate is unremarkable. Other: Small bilateral fat containing inguinal hernias,  left-greater-than-right. Small umbilical fat containing hernia as well. Musculoskeletal: Curvature of the spine with scattered degenerative changes along the spine and pelvis. There is some disc bulging and osteophyte formation. IMPRESSION: 1. Status post right upper lobectomy. Bandlike areas of nodularity in the right lung are similar to previous overall when adjusting for technique. Recommend continued surveillance. 2. Mildly enlarged subcarinal and left hilar lymph nodes, slightly increased in size when compared with the prior. 3. No evidence of metastatic disease in the abdomen or pelvis. 4. Cholelithiasis. 5. Small bilateral fat containing inguinal hernias, left-greater-than-right. Small umbilical fat containing hernia as well. 6. Of note there is a loop of colon extending between the liver in the anterior abdominal wall, atypical distribution of bowel. Correlate for Chilaiditi syndrome. 7. Stable 3.5 cm Bosniak 1 right-sided renal cyst. 8. Aortic atherosclerosis. Aortic Atherosclerosis (ICD10-I70.0). Electronically Signed   By: Jill Side M.D.   On: 04/26/2022 16:36   CT Abdomen Pelvis W Contrast  Result Date: 04/26/2022 CLINICAL DATA:  Non-small-cell lung cancer staging. * Tracking Code: BO * EXAM: CT CHEST, ABDOMEN, AND PELVIS WITH CONTRAST TECHNIQUE: Multidetector CT imaging of the chest, abdomen and pelvis was performed following the standard protocol during bolus administration of intravenous contrast. RADIATION DOSE REDUCTION: This exam was performed according to the departmental dose-optimization program which includes automated exposure control, adjustment of the mA and/or kV according to patient size and/or use of iterative reconstruction technique. CONTRAST:  186mL OMNIPAQUE IOHEXOL 300 MG/ML  SOLN COMPARISON:  12/21/2021 and older FINDINGS: CT CHEST FINDINGS Cardiovascular: The heart is nonenlarged. No pericardial effusion. Stable thoracic aorta with minimal atherosclerotic disease.  Mediastinum/Nodes: Small thyroid gland. Mildly patulous thoracic esophagus. No specific abnormal lymph node enlargement seen in the axillary regions, hilum. There are some prominent lymph nodes in the mediastinum including subcarinal and anterior to the left main bronchus. Subcarinal node measures 18 by 14 mm on series 2, image 30 and previously 20 x 9 mm, slightly larger. Other nodes elsewhere in the similar location are also slightly larger. Lungs/Pleura: There is some mild linear opacity seen at the left lung base, likely scar or atelectasis. Mild left apical pleural thickening. No left-sided pleural effusion, pneumothorax or consolidation. No dominant left lung nodule. The right lung has volume loss with surgical changes of the right lung hilum from lobectomy. There is scattered peripheral areas of scarring atelectatic changes. No right-sided pleural effusion or pneumothorax. There are some areas of nodularity in the right lung. These lesions will be specifically followed. Area near the hilum and surgical margin today on series 6,  image 52 measures 2.4 by 2.2 cm today and previously when measured on the prior in the same fashion is today would have measured 2.2 by 2.0 cm, similar. The area more peripherally in the right lower lobe abutting the lower fissure measuring 4.1 x 1.6 cm today, previously when measured in same fashion on the prior would have measured 4.2 x 1.7 cm. Similar adjusting for technique. This particular area extends in a confluent fashion more superiorly as seen on coronal image 73 and appears similar. In the axial plane the area more superiorly for example on series 6, image 46 measures 2.2 by 1.1 cm and previously when measured in a similar fashion on the prior would have measured 2.1 x 0.9 cm. This is multilobular and is unchanged overall. No new dominant right-sided lung nodule identified. Few other tiny nodules are stable. There is volume loss in the right hemithorax with elevation of the  right hemidiaphragm. Musculoskeletal: Scattered degenerative changes are seen along the spine. CT ABDOMEN PELVIS FINDINGS Hepatobiliary: No enhancing liver lesion. Large stone in the nondilated gallbladder. Patent portal vein. Pancreas: Preserved pancreatic parenchyma without enhancing mass. Spleen: Spleen is nonenlarged.  Preserved enhancement. Adrenals/Urinary Tract: Adrenal glands are preserved. No mass lesion. Mild nonspecific perinephric stranding. No enhancing renal mass or collecting system filling defect. Ureters grossly normal course and caliber down to the bladder. Preserved contours of the urinary bladder. Stable 3.5 cm Bosniak 1 exophytic right-sided renal cyst extending anterior and superior from the right kidney. Stomach/Bowel: On this non oral contrast exam, the large bowel has a normal course and caliber with scattered colonic stool. Few colonic diverticula. Of note there is a loop of colon extending between the liver in the anterior abdominal wall, atypical distribution of bowel. Normal appendix in the right lower quadrant. The stomach and small bowel are nondilated. Vascular/Lymphatic: Normal caliber aorta and IVC with atherosclerotic changes. No specific abnormal lymph node enlargement seen in the abdomen and pelvis. Reproductive: Prostate is unremarkable. Other: Small bilateral fat containing inguinal hernias, left-greater-than-right. Small umbilical fat containing hernia as well. Musculoskeletal: Curvature of the spine with scattered degenerative changes along the spine and pelvis. There is some disc bulging and osteophyte formation. IMPRESSION: 1. Status post right upper lobectomy. Bandlike areas of nodularity in the right lung are similar to previous overall when adjusting for technique. Recommend continued surveillance. 2. Mildly enlarged subcarinal and left hilar lymph nodes, slightly increased in size when compared with the prior. 3. No evidence of metastatic disease in the abdomen or  pelvis. 4. Cholelithiasis. 5. Small bilateral fat containing inguinal hernias, left-greater-than-right. Small umbilical fat containing hernia as well. 6. Of note there is a loop of colon extending between the liver in the anterior abdominal wall, atypical distribution of bowel. Correlate for Chilaiditi syndrome. 7. Stable 3.5 cm Bosniak 1 right-sided renal cyst. 8. Aortic atherosclerosis. Aortic Atherosclerosis (ICD10-I70.0). Electronically Signed   By: Jill Side M.D.   On: 04/26/2022 16:36     ASSESSMENT AND PLAN:  This is a very pleasant 77 years old white male with recurrent non-small cell lung cancer, adenocarcinoma and positive EGFR mutation in exon 21. He is currently on Tarceva 100 mg by mouth daily status post around 93 months of treatment. The patient has no complaints today except for the dry skin from the cold weather as well as Tarceva. He had repeat CT scan of the chest, abdomen and pelvis performed recently.  I personally and independently reviewed the scan images and discussed the result and  showed the images to the patient today. His scan showed no concerning findings for disease progression except for mildly enlarged subcarinal and left hilar lymph node that need close monitoring. I recommended for the patient to continue his current treatment.  She does the same dose. I will see him back for follow-up visit in 2 months for evaluation and repeat blood work. He was advised to call immediately if he has any other concerning symptoms in the interval.   The patient voices understanding of current disease status and treatment options and is in agreement with the current care plan. All questions were answered. The patient knows to call the clinic with any problems, questions or concerns. We can certainly see the patient much sooner if necessary. The total time spent in the appointment was 30 minutes.  Disclaimer: This note was dictated with voice recognition software. Similar sounding  words can inadvertently be transcribed and may not be corrected upon review.

## 2022-04-29 ENCOUNTER — Telehealth: Payer: Self-pay | Admitting: Internal Medicine

## 2022-04-29 NOTE — Telephone Encounter (Signed)
Rescheduled appointments per patient's request, called and left a voicemail regarding March appointments.

## 2022-05-17 ENCOUNTER — Ambulatory Visit (INDEPENDENT_AMBULATORY_CARE_PROVIDER_SITE_OTHER): Payer: Medicare Other | Admitting: Thoracic Surgery (Cardiothoracic Vascular Surgery)

## 2022-05-17 VITALS — BP 155/83 | HR 67 | Resp 20 | Ht 72.0 in | Wt 245.0 lb

## 2022-05-17 DIAGNOSIS — Z902 Acquired absence of lung [part of]: Secondary | ICD-10-CM

## 2022-05-17 DIAGNOSIS — C3431 Malignant neoplasm of lower lobe, right bronchus or lung: Secondary | ICD-10-CM | POA: Diagnosis not present

## 2022-05-17 NOTE — Progress Notes (Signed)
[address]       [place] [address]             [phone]     HPI: [name] returns for follow-up regarding his recurrent lung cancer.  [name] is a 77 year old male with history of hypertension, hyperlipidemia, hypothyroidism, agent orange exposure, sleep apnea, and adenocarcinoma of the lung.  He had a right upper lobectomy for adenocarcinoma lung in 2007.  He developed a recurrence about 10 years later.  Biopsy showed EGFR positive adenocarcinoma.  He has been treated with Tarceva by Dr. [name].  I last saw him in October 2023.  He was feeling well at that time even though his CT showed some slight growth and some lung nodules.  In the interim since that visit he continues to feel well.  He is tolerating Tarceva well.  He not having any issues with chest pain or shortness of breath.  Past Medical History:  Diagnosis Date   Cholelithiases 4/56/2563   Complication of anesthesia 2007   quit breathing with bronchoscopy, had to spend night   History of agent Orange exposure 44-45 yrs ago   History of pneumonia  last 3-4 yrs ago   3 -4 different times   Hypercholesterolemia    Hypertension    Hypothyroidism    lung ca dx'd 2007   lung right   Sinus disease    hx of   Sleep apnea    uses cpap setting opf 12    Current Outpatient Medications  Medication Sig Dispense Refill   amitriptyline (ELAVIL) 50 MG tablet Take 50 mg by mouth at bedtime.     aspirin 81 MG tablet Take 81 mg by mouth every morning.      Cholecalciferol 2000 units CAPS Take 2,000 Units by mouth daily.      clindamycin (CLEOCIN T) 1 % external solution APPLY TO AFFECTED AREA(S) TWO TIMES A DAY AS NEEDED FOR SKIN IRRITATION 30 mL 0   cloNIDine (CATAPRES) 0.2 MG tablet Take 0.2 mg by mouth daily.     clotrimazole (LOTRIMIN) 1 % cream Apply 1 Application topically 2 (two) times daily.     erlotinib (TARCEVA) 100 MG tablet Take 1 tablet (100 mg total) by mouth daily. Take on an  empty stomach 1 hour before meals or 2 hours after 30 tablet 2   levETIRAcetam (KEPPRA) 750 MG tablet Take 750 mg by mouth at bedtime.      metoprolol succinate (TOPROL-XL) 50 MG 24 hr tablet Take 1 tablet (50 mg total) by mouth daily. Take with or immediately following a meal. 90 tablet 3   Multiple Vitamins-Minerals (CENTRUM SILVER PO) Take 1 tablet by mouth daily.      nitroGLYCERIN (NITROSTAT) 0.4 MG SL tablet DISSOLVE 1 TAB UNDER TONGUE FOR CHEST PAIN - IF PAIN REMAINS AFTER 5 MIN, CALL 911 AND REPEAT DOSE. MAX 3 TABS IN 15 MINUTES 25 tablet 10   polyethylene glycol (MIRALAX / GLYCOLAX) packet Take 17 g by mouth daily. Reported on 08/27/2015     psyllium (METAMUCIL) 58.6 % powder Take 1 packet by mouth daily. Reported on 08/27/2015     rosuvastatin (CRESTOR) 5 MG tablet Take 5 mg by mouth every morning.      SYNTHROID 112 MCG tablet Take 112 mcg by mouth daily.     valsartan-hydrochlorothiazide (DIOVAN-HCT) 320-25 MG per tablet Take 1 tablet by mouth every morning.      No current facility-administered medications for  this visit.    Physical Exam BP (!) 155/83 (BP Location: Left Arm, Patient Position: Sitting)   Pulse 67   Resp 20   Ht 6' (1.829 m)   Wt 245 lb (111.1 kg)   SpO2 96% Comment: RA  BMI 33.60 kg/m  77 year old man in no acute distress Alert and oriented x 3 with no focal deficits Lungs diminished at right base but otherwise clear No cervical or supraclavicular adenopathy Cardiac regular rate and rhythm  Diagnostic Tests: CT CHEST, ABDOMEN, AND PELVIS WITH CONTRAST   TECHNIQUE: Multidetector CT imaging of the chest, abdomen and pelvis was performed following the standard protocol during bolus administration of intravenous contrast.   RADIATION DOSE REDUCTION: This exam was performed according to the departmental dose-optimization program which includes automated exposure control, adjustment of the mA and/or kV according to patient size and/or use of iterative  reconstruction technique.   CONTRAST:  167mL OMNIPAQUE IOHEXOL 300 MG/ML  SOLN   COMPARISON:  12/21/2021 and older   FINDINGS: CT CHEST FINDINGS   Cardiovascular: The heart is nonenlarged. No pericardial effusion. Stable thoracic aorta with minimal atherosclerotic disease.   Mediastinum/Nodes: Small thyroid gland. Mildly patulous thoracic esophagus. No specific abnormal lymph node enlargement seen in the axillary regions, hilum. There are some prominent lymph nodes in the mediastinum including subcarinal and anterior to the left main bronchus. Subcarinal node measures 18 by 14 mm on series 2, image 30 and previously 20 x 9 mm, slightly larger. Other nodes elsewhere in the similar location are also slightly larger.   Lungs/Pleura: There is some mild linear opacity seen at the left lung base, likely scar or atelectasis. Mild left apical pleural thickening. No left-sided pleural effusion, pneumothorax or consolidation. No dominant left lung nodule.   The right lung has volume loss with surgical changes of the right lung hilum from lobectomy. There is scattered peripheral areas of scarring atelectatic changes. No right-sided pleural effusion or pneumothorax. There are some areas of nodularity in the right lung. These lesions will be specifically followed. Area near the hilum and surgical margin today on series 6, image 52 measures 2.4 by 2.2 cm today and previously when measured on the prior in the same fashion is today would have measured 2.2 by 2.0 cm, similar. The area more peripherally in the right lower lobe abutting the lower fissure measuring 4.1 x 1.6 cm today, previously when measured in same fashion on the prior would have measured 4.2 x 1.7 cm. Similar adjusting for technique. This particular area extends in a confluent fashion more superiorly as seen on coronal image 73 and appears similar. In the axial plane the area more superiorly for example on series 6, image 46  measures 2.2 by 1.1 cm and previously when measured in a similar fashion on the prior would have measured 2.1 x 0.9 cm. This is multilobular and is unchanged overall. No new dominant right-sided lung nodule identified. Few other tiny nodules are stable. There is volume loss in the right hemithorax with elevation of the right hemidiaphragm.   Musculoskeletal: Scattered degenerative changes are seen along the spine.   CT ABDOMEN PELVIS FINDINGS   Hepatobiliary: No enhancing liver lesion. Large stone in the nondilated gallbladder. Patent portal vein.   Pancreas: Preserved pancreatic parenchyma without enhancing mass.   Spleen: Spleen is nonenlarged.  Preserved enhancement.   Adrenals/Urinary Tract: Adrenal glands are preserved. No mass lesion. Mild nonspecific perinephric stranding. No enhancing renal mass or collecting system filling defect. Ureters grossly normal course  and caliber down to the bladder. Preserved contours of the urinary bladder. Stable 3.5 cm Bosniak 1 exophytic right-sided renal cyst extending anterior and superior from the right kidney.   Stomach/Bowel: On this non oral contrast exam, the large bowel has a normal course and caliber with scattered colonic stool. Few colonic diverticula. Of note there is a loop of colon extending between the liver in the anterior abdominal wall, atypical distribution of bowel. Normal appendix in the right lower quadrant. The stomach and small bowel are nondilated.   Vascular/Lymphatic: Normal caliber aorta and IVC with atherosclerotic changes. No specific abnormal lymph node enlargement seen in the abdomen and pelvis.   Reproductive: Prostate is unremarkable.   Other: Small bilateral fat containing inguinal hernias, left-greater-than-right. Small umbilical fat containing hernia as well.   Musculoskeletal: Curvature of the spine with scattered degenerative changes along the spine and pelvis. There is some disc bulging  and osteophyte formation.   IMPRESSION: 1. Status post right upper lobectomy. Bandlike areas of nodularity in the right lung are similar to previous overall when adjusting for technique. Recommend continued surveillance. 2. Mildly enlarged subcarinal and left hilar lymph nodes, slightly increased in size when compared with the prior. 3. No evidence of metastatic disease in the abdomen or pelvis. 4. Cholelithiasis. 5. Small bilateral fat containing inguinal hernias, left-greater-than-right. Small umbilical fat containing hernia as well. 6. Of note there is a loop of colon extending between the liver in the anterior abdominal wall, atypical distribution of bowel. Correlate for Chilaiditi syndrome. 7. Stable 3.5 cm Bosniak 1 right-sided renal cyst. 8. Aortic atherosclerosis.   Aortic Atherosclerosis (ICD10-I70.0).     Electronically Signed   By: Jill Side M.D.   On: 04/26/2022 16:36 I personally reviewed CT chest images.  Possibly some slight increase in size of subcarinal nodes.  There has been an increase in size of a nodule adjacent to the hilum from 2 x 2.2 cm to 2.2 x 2.4 cm.  Impression: [name] is a 77 year old male with history of hypertension, hyperlipidemia, hypothyroidism, agent orange exposure, sleep apnea, and adenocarcinoma of the lung.  He had a right upper lobectomy for adenocarcinoma lung in 2007.  He developed a recurrence about 10 years later.  Biopsy at that time showed EGFR positive adenocarcinoma.  He has been treated with Tarceva by Dr. [name].  His recent CT showed some increase in size of a nodule adjacent to the hilum and previous staple line.  Relatively mild increase.  I am not convinced there is an increase in size of the subcarinal node as it is being measured in a slightly different plane.  The same token cannot completely rule out an increase.  If so it is relatively subtle.  Agree with Dr. Worthy Flank plan to continue current therapy and repeat  another scan in 4 months.  Plan: Follow-up as scheduled with Dr. Earlie Server I will see him back in about 4 months after CT as well.  Melrose Nakayama, MD [facility] [phone]

## 2022-05-24 ENCOUNTER — Other Ambulatory Visit (HOSPITAL_COMMUNITY): Payer: Self-pay

## 2022-05-26 ENCOUNTER — Other Ambulatory Visit (HOSPITAL_COMMUNITY): Payer: Self-pay

## 2022-05-27 ENCOUNTER — Telehealth: Payer: Self-pay | Admitting: Internal Medicine

## 2022-05-27 ENCOUNTER — Other Ambulatory Visit (HOSPITAL_COMMUNITY): Payer: Self-pay

## 2022-05-27 ENCOUNTER — Other Ambulatory Visit (HOSPITAL_BASED_OUTPATIENT_CLINIC_OR_DEPARTMENT_OTHER): Payer: Self-pay

## 2022-05-27 NOTE — Telephone Encounter (Signed)
Called patient regarding upcoming March appointments, left a voicemail.

## 2022-05-30 ENCOUNTER — Other Ambulatory Visit: Payer: Self-pay

## 2022-05-30 ENCOUNTER — Other Ambulatory Visit (HOSPITAL_COMMUNITY): Payer: Self-pay

## 2022-06-21 ENCOUNTER — Other Ambulatory Visit: Payer: Self-pay

## 2022-06-21 ENCOUNTER — Other Ambulatory Visit (HOSPITAL_COMMUNITY): Payer: Self-pay

## 2022-06-21 ENCOUNTER — Other Ambulatory Visit: Payer: Self-pay | Admitting: Physician Assistant

## 2022-06-21 DIAGNOSIS — C3431 Malignant neoplasm of lower lobe, right bronchus or lung: Secondary | ICD-10-CM

## 2022-06-21 MED ORDER — ERLOTINIB HCL 100 MG PO TABS
100.0000 mg | ORAL_TABLET | Freq: Every day | ORAL | 2 refills | Status: DC
  Filled 2022-06-21: qty 30, 30d supply, fill #0
  Filled 2022-07-14: qty 30, 30d supply, fill #1
  Filled 2022-08-25: qty 30, 30d supply, fill #2

## 2022-06-22 ENCOUNTER — Other Ambulatory Visit (HOSPITAL_COMMUNITY): Payer: Self-pay

## 2022-06-24 ENCOUNTER — Other Ambulatory Visit: Payer: Self-pay

## 2022-06-27 ENCOUNTER — Ambulatory Visit: Payer: Medicare Other | Admitting: Internal Medicine

## 2022-06-27 ENCOUNTER — Other Ambulatory Visit: Payer: Medicare Other

## 2022-06-28 ENCOUNTER — Inpatient Hospital Stay (HOSPITAL_BASED_OUTPATIENT_CLINIC_OR_DEPARTMENT_OTHER): Payer: Medicare Other | Admitting: Internal Medicine

## 2022-06-28 ENCOUNTER — Inpatient Hospital Stay: Payer: Medicare Other | Attending: Internal Medicine

## 2022-06-28 VITALS — BP 153/77 | HR 60 | Temp 97.6°F | Resp 16 | Wt 235.6 lb

## 2022-06-28 DIAGNOSIS — C3411 Malignant neoplasm of upper lobe, right bronchus or lung: Secondary | ICD-10-CM | POA: Insufficient documentation

## 2022-06-28 DIAGNOSIS — Z79899 Other long term (current) drug therapy: Secondary | ICD-10-CM | POA: Insufficient documentation

## 2022-06-28 DIAGNOSIS — C3490 Malignant neoplasm of unspecified part of unspecified bronchus or lung: Secondary | ICD-10-CM | POA: Diagnosis not present

## 2022-06-28 DIAGNOSIS — D649 Anemia, unspecified: Secondary | ICD-10-CM | POA: Diagnosis not present

## 2022-06-28 DIAGNOSIS — E7800 Pure hypercholesterolemia, unspecified: Secondary | ICD-10-CM | POA: Diagnosis not present

## 2022-06-28 DIAGNOSIS — I10 Essential (primary) hypertension: Secondary | ICD-10-CM | POA: Insufficient documentation

## 2022-06-28 DIAGNOSIS — E039 Hypothyroidism, unspecified: Secondary | ICD-10-CM | POA: Diagnosis not present

## 2022-06-28 DIAGNOSIS — Z7982 Long term (current) use of aspirin: Secondary | ICD-10-CM | POA: Insufficient documentation

## 2022-06-28 DIAGNOSIS — C3431 Malignant neoplasm of lower lobe, right bronchus or lung: Secondary | ICD-10-CM

## 2022-06-28 DIAGNOSIS — Z902 Acquired absence of lung [part of]: Secondary | ICD-10-CM | POA: Insufficient documentation

## 2022-06-28 LAB — CBC WITH DIFFERENTIAL (CANCER CENTER ONLY)
Abs Immature Granulocytes: 0.02 10*3/uL (ref 0.00–0.07)
Basophils Absolute: 0 10*3/uL (ref 0.0–0.1)
Basophils Relative: 1 %
Eosinophils Absolute: 0.3 10*3/uL (ref 0.0–0.5)
Eosinophils Relative: 5 %
HCT: 36.9 % — ABNORMAL LOW (ref 39.0–52.0)
Hemoglobin: 12.8 g/dL — ABNORMAL LOW (ref 13.0–17.0)
Immature Granulocytes: 0 %
Lymphocytes Relative: 48 %
Lymphs Abs: 2.9 10*3/uL (ref 0.7–4.0)
MCH: 33.9 pg (ref 26.0–34.0)
MCHC: 34.7 g/dL (ref 30.0–36.0)
MCV: 97.6 fL (ref 80.0–100.0)
Monocytes Absolute: 0.6 10*3/uL (ref 0.1–1.0)
Monocytes Relative: 11 %
Neutro Abs: 2.1 10*3/uL (ref 1.7–7.7)
Neutrophils Relative %: 35 %
Platelet Count: 210 10*3/uL (ref 150–400)
RBC: 3.78 MIL/uL — ABNORMAL LOW (ref 4.22–5.81)
RDW: 12.7 % (ref 11.5–15.5)
WBC Count: 6 10*3/uL (ref 4.0–10.5)
nRBC: 0 % (ref 0.0–0.2)

## 2022-06-28 LAB — CMP (CANCER CENTER ONLY)
ALT: 33 U/L (ref 0–44)
AST: 35 U/L (ref 15–41)
Albumin: 4.2 g/dL (ref 3.5–5.0)
Alkaline Phosphatase: 68 U/L (ref 38–126)
Anion gap: 4 — ABNORMAL LOW (ref 5–15)
BUN: 16 mg/dL (ref 8–23)
CO2: 32 mmol/L (ref 22–32)
Calcium: 9.7 mg/dL (ref 8.9–10.3)
Chloride: 101 mmol/L (ref 98–111)
Creatinine: 0.99 mg/dL (ref 0.61–1.24)
GFR, Estimated: >60 mL/min (ref >60)
Glucose, Bld: 97 mg/dL (ref 70–99)
Potassium: 3.5 mmol/L (ref 3.5–5.1)
Sodium: 137 mmol/L (ref 135–145)
Total Bilirubin: 0.8 mg/dL (ref 0.3–1.2)
Total Protein: 6.3 g/dL — ABNORMAL LOW (ref 6.5–8.1)

## 2022-06-28 NOTE — Progress Notes (Signed)
[place] Telephone:([phone]   Fax:(336) [phone]  OFFICE PROGRESS NOTE  [name], MD 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  PRIOR THERAPY: 1) Status post right upper lobectomy under the care of Dr. Roxan Hockey on 01/23/2006.  2) Tarceva 150 mg by mouth daily as part of the BMS checkmate 370 clinical trial. Status post 6 weeks of treatment.  CURRENT THERAPY: Tarceva 100 mg by mouth daily as part of the BMS Checkmate 370 clinical trial. First dose started 07/17/2014 status post 95 months of treatment.  INTERVAL HISTORY: [name] 77 y.o. male returns to the clinic today for follow-up visit.  The patient is feeling fine today with no concerning complaints.  He denied having any skin rash or diarrhea.  He has no nausea, vomiting, diarrhea or constipation.  He has no chest pain, shortness of breath, cough or hemoptysis.  He has no recent weight loss or night sweats.  He continues to tolerate his treatment with Tarceva fairly well.  He is here today for evaluation and repeat blood work.  MEDICAL HISTORY: Past Medical History:  Diagnosis Date   Cholelithiases [id number]   Complication of anesthesia 2007   quit breathing with bronchoscopy, had to spend night   History of agent Orange exposure 44-45 yrs ago   History of pneumonia  last 3-4 yrs ago   3 -4 different times   Hypercholesterolemia    Hypertension    Hypothyroidism    lung ca dx'd 2007   lung right   Sinus disease    hx of   Sleep apnea    uses cpap setting opf 12    ALLERGIES:  is allergic to lisinopril and prednisone.  MEDICATIONS:  Current Outpatient Medications  Medication Sig Dispense Refill   amitriptyline (ELAVIL) 50 MG tablet Take 50 mg by mouth at bedtime.     aspirin 81 MG tablet  Take 81 mg by mouth every morning.      Cholecalciferol 2000 units CAPS Take 2,000 Units by mouth daily.      clindamycin (CLEOCIN T) 1 % external solution APPLY TO AFFECTED AREA(S) TWO TIMES A DAY AS NEEDED FOR SKIN IRRITATION 30 mL 0   cloNIDine (CATAPRES) 0.2 MG tablet Take 0.2 mg by mouth daily.     clotrimazole (LOTRIMIN) 1 % cream Apply 1 Application topically 2 (two) times daily.     erlotinib (TARCEVA) 100 MG tablet Take 1 tablet (100 mg total) by mouth daily. Take on an empty stomach 1 hour before meals or 2 hours after 30 tablet 2   levETIRAcetam (KEPPRA) 750 MG tablet Take 750 mg by mouth at bedtime.      metoprolol succinate (TOPROL-XL) 50 MG 24 hr tablet Take 1 tablet (50 mg total) by mouth daily. Take with or immediately following a meal. 90 tablet 3   Multiple Vitamins-Minerals (CENTRUM SILVER PO) Take 1 tablet by mouth daily.      nitroGLYCERIN (NITROSTAT) 0.4 MG SL tablet DISSOLVE 1 TAB UNDER TONGUE FOR CHEST PAIN - IF PAIN REMAINS AFTER 5 MIN, CALL 911 AND REPEAT DOSE. MAX 3 TABS IN 15 MINUTES 25 tablet 10   polyethylene glycol (MIRALAX / GLYCOLAX) packet Take 17 g by mouth daily. Reported on 08/27/2015     psyllium (METAMUCIL) 58.6 % powder Take  1 packet by mouth daily. Reported on 08/27/2015     rosuvastatin (CRESTOR) 5 MG tablet Take 5 mg by mouth every morning.      SYNTHROID 112 MCG tablet Take 112 mcg by mouth daily.     valsartan-hydrochlorothiazide (DIOVAN-HCT) 320-25 MG per tablet Take 1 tablet by mouth every morning.      No current facility-administered medications for this visit.    SURGICAL HISTORY:  Past Surgical History:  Procedure Laterality Date   BIOPSY  05/21/2018   Procedure: BIOPSY;  Surgeon: Wonda Horner, MD;  Location: [facility];  Service: Endoscopy;;   BIOPSY  05/11/2021   Procedure: BIOPSY;  Surgeon: Otis Brace, MD;  Location: [facility];  Service: Gastroenterology;;   COLONOSCOPY WITH PROPOFOL N/A 03/19/2013   Procedure: COLONOSCOPY WITH  PROPOFOL;  Surgeon: Garlan Fair, MD;  Location: [facility];  Service: Endoscopy;  Laterality: N/A;   COLONOSCOPY WITH PROPOFOL N/A 05/11/2021   Procedure: COLONOSCOPY WITH PROPOFOL;  Surgeon: Otis Brace, MD;  Location: [facility];  Service: Gastroenterology;  Laterality: N/A;   Cystourethroscopy, [facility] TURP.  04/16/2010   ESOPHAGOGASTRODUODENOSCOPY (EGD) WITH PROPOFOL N/A 05/21/2018   Procedure: ESOPHAGOGASTRODUODENOSCOPY (EGD) WITH PROPOFOL;  Surgeon: Wonda Horner, MD;  Location: [facility];  Service: Endoscopy;  Laterality: N/A;   NASAL SINUS SURGERY  1987   POLYPECTOMY  05/11/2021   Procedure: POLYPECTOMY;  Surgeon: Otis Brace, MD;  Location: [facility];  Service: Gastroenterology;;   Right video-assisted thoracoscopy, right upper lobectomy and  01/23/2006   The Olympus video bronchoscope was introduced via the right  12/21/2005   TONSILLECTOMY  1957   Torn medial and lateral menisci, left knee.  11/06/2006    REVIEW OF SYSTEMS:  A comprehensive review of systems was negative.   PHYSICAL EXAMINATION: General appearance: alert, cooperative, and no distress Head: Normocephalic, without obvious abnormality, atraumatic Neck: no adenopathy, no JVD, supple, symmetrical, trachea midline, and thyroid not enlarged, symmetric, no tenderness/mass/nodules Lymph nodes: Cervical, supraclavicular, and axillary nodes normal. Resp: clear to auscultation bilaterally Back: symmetric, no curvature. ROM normal. No CVA tenderness. Cardio: regular rate and rhythm, S1, S2 normal, no murmur, click, rub or gallop GI: soft, non-tender; bowel sounds normal; no masses,  no organomegaly Extremities: extremities normal, atraumatic, no cyanosis or edema  ECOG PERFORMANCE STATUS: 0 - Asymptomatic  Blood pressure (!) 153/77, pulse 60, temperature 97.6 F (36.4 C), temperature source Oral, resp. rate 16, weight 235 lb 9.6 oz (106.9 kg), SpO2 99 %.  LABORATORY DATA: Lab Results  Component  Value Date   WBC 5.7 04/25/2022   HGB 12.7 (L) 04/25/2022   HCT 38.5 (L) 04/25/2022   MCV 103.5 (H) 04/25/2022   PLT 222 04/25/2022      Chemistry      Component Value Date/Time   NA 136 04/25/2022 0851   NA 141 03/11/2019 0959   NA 139 04/06/2017 0932   K 3.6 04/25/2022 0851   K 3.8 04/06/2017 0932   CL 102 04/25/2022 0851   CL 102 04/24/2012 0853   CO2 31 04/25/2022 0851   CO2 29 04/06/2017 0932   BUN 16 04/25/2022 0851   BUN 18 03/11/2019 0959   BUN 14.4 04/06/2017 0932   CREATININE 0.95 04/25/2022 0851   CREATININE 1.0 04/06/2017 0932      Component Value Date/Time   CALCIUM 9.6 04/25/2022 0851   CALCIUM 9.5 04/06/2017 0932   ALKPHOS 62 04/25/2022 0851   ALKPHOS 58 04/06/2017 0932   AST 33 04/25/2022 0851  AST 27 04/06/2017 0932   ALT 28 04/25/2022 0851   ALT 27 04/06/2017 0932   BILITOT 0.7 04/25/2022 0851   BILITOT 0.45 04/06/2017 0932       RADIOGRAPHIC STUDIES: No results found.   ASSESSMENT AND PLAN:  This is a very pleasant 77 years old white male with recurrent non-small cell lung cancer, adenocarcinoma and positive EGFR mutation in exon 21. He is currently on Tarceva 100 mg by mouth daily status post around 95 months of treatment. The patient has been tolerating his treatment with Tarceva fairly well with no concerning adverse effects. Repeat CBC today is unremarkable except for mild anemia.  Comprehensive metabolic panel is still pending. I recommended for him to continue his current treatment with Tarceva with the same dose. I will see him back for follow-up visit in 2 months for evaluation and repeat CT scan of the chest, abdomen and pelvis for restaging of his disease.   The patient voices understanding of current disease status and treatment options and is in agreement with the current care plan. All questions were answered. The patient knows to call the clinic with any problems, questions or concerns. We can certainly see the patient much sooner  if necessary. The total time spent in the appointment was 20 minutes.  Disclaimer: This note was dictated with voice recognition software. Similar sounding words can inadvertently be transcribed and may not be corrected upon review.

## 2022-07-14 ENCOUNTER — Other Ambulatory Visit (HOSPITAL_COMMUNITY): Payer: Self-pay

## 2022-07-26 ENCOUNTER — Telehealth: Payer: Self-pay | Admitting: Interventional Cardiology

## 2022-07-26 ENCOUNTER — Other Ambulatory Visit (HOSPITAL_COMMUNITY): Payer: Self-pay

## 2022-07-26 NOTE — Telephone Encounter (Signed)
Pt c/o swelling: STAT is pt has developed SOB within 24 hours  How much weight have you gained and in what time span? Doesn't believe weight gain   If swelling, where is the swelling located? Right leg   Are you currently taking a fluid pill? Yes   Are you currently SOB? No   Do you have a log of your daily weights (if so, list)? No   Have you gained 3 pounds in a day or 5 pounds in a week? No   Have you traveled recently? No.

## 2022-07-26 NOTE — Telephone Encounter (Signed)
Returned call to patient.  Patient states he has had swelling in his right leg since Sunday 07/24/22. Patient reports he had fever of 100.81F that morning (his normal temp is around 96.58F). Patient reports his leg is red, hot and painful to touch. He states he has some scratches on his leg.  Advised patient to contact his PCP to evaluate and treat for possible cellulitis. Patient verbalized understanding and will contact PCP's office to try to get an appt today or tomorrow.  Patient had appt with Gillian Shields, NP for 07/29/22, but states he had a conflict on this date and will need to reschedule. Patient requests to see Dr. Anne Fu at [facility] location on 08/10/22 at 2:00pm, appt scheduled.   Patient expressed appreciation for call.

## 2022-07-27 ENCOUNTER — Ambulatory Visit (HOSPITAL_COMMUNITY)
Admission: RE | Admit: 2022-07-27 | Discharge: 2022-07-27 | Disposition: A | Discharge status: Home / Self Care | Payer: Medicare Other | Source: Ambulatory Visit | Attending: Internal Medicine | Admitting: Internal Medicine

## 2022-07-27 ENCOUNTER — Telehealth: Payer: Self-pay | Admitting: Medical Oncology

## 2022-07-27 ENCOUNTER — Other Ambulatory Visit (HOSPITAL_COMMUNITY): Payer: Self-pay | Admitting: Internal Medicine

## 2022-07-27 DIAGNOSIS — M7989 Other specified soft tissue disorders: Secondary | ICD-10-CM | POA: Insufficient documentation

## 2022-07-27 DIAGNOSIS — M79604 Pain in right leg: Secondary | ICD-10-CM | POA: Diagnosis not present

## 2022-07-27 DIAGNOSIS — M79661 Pain in right lower leg: Secondary | ICD-10-CM | POA: Insufficient documentation

## 2022-07-27 DIAGNOSIS — I872 Venous insufficiency (chronic) (peripheral): Secondary | ICD-10-CM | POA: Diagnosis not present

## 2022-07-27 NOTE — Telephone Encounter (Signed)
Per Dr. Arbutus Ped , I LVM with pt that it is ok to take Eloquis if he has a DVT.

## 2022-07-27 NOTE — Progress Notes (Signed)
Right lower extremity venous duplex has been completed. Preliminary results can be found in CV Proc through chart review.  Results were given to Lettie at Dr. Venita Sheffield office.  07/27/22 3:01 PM Olen Cordial RVT

## 2022-07-29 ENCOUNTER — Ambulatory Visit (HOSPITAL_BASED_OUTPATIENT_CLINIC_OR_DEPARTMENT_OTHER): Payer: Medicare Other | Admitting: Family

## 2022-08-03 DIAGNOSIS — G43909 Migraine, unspecified, not intractable, without status migrainosus: Secondary | ICD-10-CM | POA: Diagnosis not present

## 2022-08-03 DIAGNOSIS — I700 Atherosclerosis of aorta: Secondary | ICD-10-CM | POA: Diagnosis not present

## 2022-08-03 DIAGNOSIS — J439 Emphysema, unspecified: Secondary | ICD-10-CM | POA: Diagnosis not present

## 2022-08-03 DIAGNOSIS — G4700 Insomnia, unspecified: Secondary | ICD-10-CM | POA: Diagnosis not present

## 2022-08-03 DIAGNOSIS — I10 Essential (primary) hypertension: Secondary | ICD-10-CM | POA: Diagnosis not present

## 2022-08-03 DIAGNOSIS — C3490 Malignant neoplasm of unspecified part of unspecified bronchus or lung: Secondary | ICD-10-CM | POA: Diagnosis not present

## 2022-08-03 DIAGNOSIS — E039 Hypothyroidism, unspecified: Secondary | ICD-10-CM | POA: Diagnosis not present

## 2022-08-03 DIAGNOSIS — M7989 Other specified soft tissue disorders: Secondary | ICD-10-CM | POA: Diagnosis not present

## 2022-08-10 ENCOUNTER — Ambulatory Visit (INDEPENDENT_AMBULATORY_CARE_PROVIDER_SITE_OTHER): Payer: Medicare Other | Admitting: Cardiology

## 2022-08-10 ENCOUNTER — Ambulatory Visit (HOSPITAL_BASED_OUTPATIENT_CLINIC_OR_DEPARTMENT_OTHER): Payer: Medicare Other | Admitting: Cardiology

## 2022-08-10 ENCOUNTER — Encounter (HOSPITAL_BASED_OUTPATIENT_CLINIC_OR_DEPARTMENT_OTHER): Payer: Self-pay | Admitting: Cardiology

## 2022-08-10 VITALS — BP 146/94 | HR 60 | Ht 72.0 in | Wt 240.0 lb

## 2022-08-10 DIAGNOSIS — R600 Localized edema: Secondary | ICD-10-CM | POA: Diagnosis not present

## 2022-08-10 DIAGNOSIS — I440 Atrioventricular block, first degree: Secondary | ICD-10-CM

## 2022-08-10 DIAGNOSIS — G4730 Sleep apnea, unspecified: Secondary | ICD-10-CM

## 2022-08-10 DIAGNOSIS — I10 Essential (primary) hypertension: Secondary | ICD-10-CM

## 2022-08-10 MED ORDER — FUROSEMIDE 40 MG PO TABS: 40.0000 mg | ORAL_TABLET | Freq: Every day | ORAL | 3 refills | Status: DC

## 2022-08-10 NOTE — Patient Instructions (Signed)
Medication Instructions:  Your physician has recommended you make the following change in your medication:   Increase: Lasix 40mg  daily  *If you need a refill on your cardiac medications before your next appointment, please call your pharmacy*  Follow-Up: At [facility], you and your health needs are our priority.  As part of our continuing mission to provide you with exceptional heart care, we have created designated Provider Care Teams.  These Care Teams include your primary Cardiologist (physician) and Advanced Practice Providers (APPs -  Physician Assistants and Nurse Practitioners) who all work together to provide you with the care you need, when you need it.  We recommend signing up for the patient portal called "MyChart".  Sign up information is provided on this After Visit Summary.  MyChart is used to connect with patients for Virtual Visits (Telemedicine).  Patients are able to view lab/test results, encounter notes, upcoming appointments, etc.  Non-urgent messages can be sent to your provider as well.   To learn more about what you can do with MyChart, go to [email or web address].    Your next appointment:   1 year(s) with Dr. Anne Fu    Other Instructions We have referred you to [facility] for Right Lower Extremity Edema

## 2022-08-10 NOTE — Progress Notes (Signed)
Cardiology Office Note:    Date:  08/10/2022   ID:  [name], DOB 1945/06/17, MRN [id number]  PCP:  [name], MD   [facility] Providers Cardiologist:  Lesleigh Noe, MD (Inactive)     Referring MD: Marden Noble, MD    History of Present Illness:    [name] is a 77 y.o. male former patient of Dr. Verdis Prime is here for follow-up of edema.  When walking with his wife and noticed that his right leg was more swollen than his left.  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  He is to stand on his feet 12 hours a day he states with his occupation.  He went to see Dr. Eula Listen who ordered a lower extremity Doppler.  Thankfully there is no evidence of DVT present.  He was given antibiotic for potential cellulitis but this did not seem to help.  He was also given Lasix 20 mg a day a few days back.  He states that he is slowly starting to urinate more.  He has never had any renal issues.  He has had labile blood pressures in the past.  He does state that after sleeping at night his leg is not completely normal but is definitely better than what it is now.   Had prior non-small cell lung cancer adenocarcinoma from 2007 with right upper lobectomy in 2007.  Sleep apnea hypertension hyperlipidemia.  COR CTA 2021:   IMPRESSION: 1. Coronary calcium score of 0. This was 0 percentile for age and sex matched control.   2. Normal coronary origin with right dominance.   3. No evidence of CAD; CADRADS-0.   4. 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   Right leg edema, red, sweelling. DVT negative, antibiotic. Not better.   Past Medical History:  Diagnosis Date   Cholelithiases 07/16/2015   Complication of anesthesia 2007   quit breathing with bronchoscopy, had to spend night   History of agent Orange exposure 44-45 yrs ago   History of pneumonia  last 3-4 yrs ago   3 -4 different times   Hypercholesterolemia    Hypertension     Hypothyroidism    lung ca dx'd 2007   lung right   Sinus disease    hx of   Sleep apnea    uses cpap setting opf 12    Past Surgical History:  Procedure Laterality Date   BIOPSY  05/21/2018   Procedure: BIOPSY;  Surgeon: Graylin Shiver, MD;  Location: [facility];  Service: Endoscopy;;   BIOPSY  05/11/2021   Procedure: BIOPSY;  Surgeon: Kathi Der, MD;  Location: [facility];  Service: Gastroenterology;;   COLONOSCOPY WITH PROPOFOL N/A 03/19/2013   Procedure: COLONOSCOPY WITH PROPOFOL;  Surgeon: Charolett Bumpers, MD;  Location: [facility];  Service: Endoscopy;  Laterality: N/A;   COLONOSCOPY WITH PROPOFOL N/A 05/11/2021   Procedure: COLONOSCOPY WITH PROPOFOL;  Surgeon: Kathi Der, MD;  Location: [facility];  Service: Gastroenterology;  Laterality: N/A;   Cystourethroscopy, [facility].  04/16/2010   ESOPHAGOGASTRODUODENOSCOPY (EGD) WITH PROPOFOL N/A 05/21/2018   Procedure: ESOPHAGOGASTRODUODENOSCOPY (EGD) WITH PROPOFOL;  Surgeon: Graylin Shiver, MD;  Location: [facility];  Service: Endoscopy;  Laterality: N/A;   NASAL SINUS SURGERY  1987   POLYPECTOMY  05/11/2021   Procedure: POLYPECTOMY;  Surgeon: Kathi Der, MD;  Location: [facility];  Service: Gastroenterology;;   Right video-assisted thoracoscopy, right upper lobectomy and  01/23/2006  The Olympus video bronchoscope was introduced via the right  12/21/2005   TONSILLECTOMY  1957   Torn medial and lateral menisci, left knee.  11/06/2006    Current Medications: Current Meds  Medication Sig   amitriptyline (ELAVIL) 50 MG tablet Take 50 mg by mouth at bedtime.   aspirin 81 MG tablet Take 81 mg by mouth every morning.    Cholecalciferol 2000 units CAPS Take 2,000 Units by mouth daily.    clindamycin (CLEOCIN T) 1 % external solution APPLY TO AFFECTED AREA(S) TWO TIMES A DAY AS NEEDED FOR SKIN IRRITATION   cloNIDine (CATAPRES) 0.1 MG tablet Take 0.1 mg by mouth daily.   cloNIDine (CATAPRES) 0.2 MG tablet  Take 0.2 mg by mouth daily.   clotrimazole (LOTRIMIN) 1 % cream Apply 1 Application topically 2 (two) times daily.   erlotinib (TARCEVA) 100 MG tablet Take 1 tablet (100 mg total) by mouth daily. Take on an empty stomach 1 hour before meals or 2 hours after   levETIRAcetam (KEPPRA) 750 MG tablet Take 750 mg by mouth at bedtime.    metoprolol succinate (TOPROL-XL) 50 MG 24 hr tablet Take 1 tablet (50 mg total) by mouth daily. Take with or immediately following a meal.   Multiple Vitamins-Minerals (CENTRUM SILVER PO) Take 1 tablet by mouth daily.    polyethylene glycol (MIRALAX / GLYCOLAX) packet Take 17 g by mouth daily. Reported on 08/27/2015   psyllium (METAMUCIL) 58.6 % powder Take 1 packet by mouth daily. Reported on 08/27/2015   rosuvastatin (CRESTOR) 5 MG tablet Take 5 mg by mouth every morning.    SYNTHROID 112 MCG tablet Take 112 mcg by mouth daily.   valsartan-hydrochlorothiazide (DIOVAN-HCT) 320-25 MG per tablet Take 1 tablet by mouth every morning.    [DISCONTINUED] furosemide (LASIX) 20 MG tablet Take 20 mg by mouth daily.     Allergies:   Lisinopril and Prednisone   Social History   Socioeconomic History   Marital status: Married    Spouse name: Not on file   Number of children: 0   Years of education: Not on file   Highest education level: Not on file  Occupational History   Occupation: Air traffic controller    Comment: Retired  Tobacco Use   Smoking status: Never   Smokeless tobacco: Never  Vaping Use   Vaping Use: Never used  Substance and Sexual Activity   Alcohol use: No    Alcohol/week: 0.0 standard drinks of alcohol   Drug use: No   Sexual activity: Not Currently  Other Topics Concern   Not on file  Social History Narrative   Not on file   Social Determinants of Health   Financial Resource Strain: Not on file  Food Insecurity: Not on file  Transportation Needs: Not on file  Physical Activity: Not on file  Stress: Not on file  Social Connections: Not on  file     Family History: The patient's family history includes Heart attack in his brother and father; Hypertension in his mother; Varicose Veins in his mother and sister.  ROS:   Please see the history of present illness.     All other systems reviewed and are negative.  EKGs/Labs/Other Studies Reviewed:    The following studies were reviewed today: Cardiac Studies & Procedures     STRESS TESTS  MYOCARDIAL PERFUSION IMAGING 01/10/2018  Narrative  Nuclear stress EF: 60%.  The left ventricular ejection fraction is normal (55-65%).  There was no ST segment deviation noted during  stress.  The study is normal.  This is a low risk study. No evidence of ischemia identified on perfusion imaging.   ECHOCARDIOGRAM  ECHOCARDIOGRAM COMPLETE 01/24/2022  Narrative ECHOCARDIOGRAM REPORT    Patient Name:   ACHYUT CANNADY Date of Exam: 01/24/2022 Medical Rec #:  [id number]       Height:       72.0 in Accession #:    [id number]      Weight:       244.4 lb Date of Birth:  June 02, 1945       BSA:          2.320 m Patient Age:    76 years        BP:           152/88 mmHg Patient Gender: M               HR:           67 bpm. Exam Location:  [address]  Procedure: 2D Echo, Cardiac Doppler and Color Doppler  Indications:    I10 Hypertension  History:        Patient has no prior history of Echocardiogram examinations. Risk Factors:Hypertension and Dyslipidemia.  Sonographer:    Samule Ohm RDCS Referring Phys: [id number] Barry Dienes [facility]  IMPRESSIONS   1. Left ventricular ejection fraction, by estimation, is 60 to 65%. The left ventricle has normal function. The left ventricle has no regional wall motion abnormalities. There is mild left ventricular hypertrophy. Left ventricular diastolic parameters are indeterminate. 2. Right ventricular systolic function is normal. The right ventricular size is normal. 3. Left atrial size was mildly dilated. 4. The mitral valve is normal in  structure. No evidence of mitral valve regurgitation. No evidence of mitral stenosis. 5. The aortic valve was not well visualized. Aortic valve regurgitation is not visualized. No aortic stenosis is present.  FINDINGS Left Ventricle: Left ventricular ejection fraction, by estimation, is 60 to 65%. The left ventricle has normal function. The left ventricle has no regional wall motion abnormalities. The left ventricular internal cavity size was normal in size. There is mild left ventricular hypertrophy. Left ventricular diastolic parameters are indeterminate.  Right Ventricle: The right ventricular size is normal. No increase in right ventricular wall thickness. Right ventricular systolic function is normal.  Left Atrium: Left atrial size was mildly dilated.  Right Atrium: Right atrial size was normal in size.  Pericardium: There is no evidence of pericardial effusion.  Mitral Valve: The mitral valve is normal in structure. No evidence of mitral valve regurgitation. No evidence of mitral valve stenosis.  Tricuspid Valve: The tricuspid valve is normal in structure. Tricuspid valve regurgitation is trivial.  Aortic Valve: The aortic valve was not well visualized. Aortic valve regurgitation is not visualized. No aortic stenosis is present.  Pulmonic Valve: The pulmonic valve was not well visualized. Pulmonic valve regurgitation is not visualized.  Aorta: The aortic root and ascending aorta are structurally normal, with no evidence of dilitation.  IAS/Shunts: The interatrial septum was not well visualized.   LEFT VENTRICLE PLAX 2D LVIDd:         4.20 cm   Diastology LVIDs:         3.00 cm   LV e' medial:    8.92 cm/s LV PW:         1.50 cm   LV E/e' medial:  12.9 LV IVS:        1.50 cm   LV e' lateral:  8.92 cm/s LVOT diam:     2.00 cm   LV E/e' lateral: 12.9 LV SV:         85 LV SV Index:   37 LVOT Area:     3.14 cm   RIGHT VENTRICLE             IVC RV S prime:     13.50 cm/s   IVC diam: 1.00 cm TAPSE (M-mode): 2.6 cm RVSP:           31.3 mmHg  LEFT ATRIUM              Index        RIGHT ATRIUM           Index LA diam:        4.30 cm  1.85 cm/m   RA Pressure: 3.00 mmHg LA Vol (A2C):   108.0 ml 46.55 ml/m  RA Area:     18.00 cm LA Vol (A4C):   61.6 ml  26.55 ml/m  RA Volume:   47.80 ml  20.60 ml/m LA Biplane Vol: 83.4 ml  35.95 ml/m AORTIC VALVE LVOT Vmax:   125.00 cm/s LVOT Vmean:  91.500 cm/s LVOT VTI:    0.271 m  AORTA Ao Root diam: 3.90 cm Ao Asc diam:  3.70 cm  MITRAL VALVE                TRICUSPID VALVE MV Area (PHT): 2.62 cm     TR Peak grad:   28.3 mmHg MV Decel Time: 289 msec     TR Vmax:        266.00 cm/s MV E velocity: 115.00 cm/s  Estimated RAP:  3.00 mmHg MV A velocity: 110.00 cm/s  RVSP:           31.3 mmHg MV E/A ratio:  1.05 SHUNTS Systemic VTI:  0.27 m Systemic Diam: 2.00 cm  Epifanio Lesches MD Electronically signed by Epifanio Lesches MD Signature Date/Time: 01/24/2022/4:22:37 PM    Final     CT SCANS  CT CORONARY MORPH W/CTA COR W/SCORE 05/14/2019  Addendum 05/14/2019  1:05 PM ADDENDUM REPORT: 05/14/2019 13:02  CLINICAL DATA:  77 yo male with exertional chest pain  EXAM: Cardiac/Coronary  CT  TECHNIQUE: The patient was scanned on a [employer].  FINDINGS: A 120 kV prospective scan was triggered in the descending thoracic aorta at 111 HU's. Axial non-contrast 3 mm slices were carried out through the heart. The data set was analyzed on a dedicated work station and scored using the Agatson method. Gantry rotation speed was 250 msecs and collimation was .6 mm. No beta blockade and 0.8 mg of sl NTG was given. The 3D data set was reconstructed in 5% intervals of the 67-82 % of the R-R cycle. Diastolic phases were analyzed on a dedicated work station using MPR, MIP and VRT modes. The patient received 80 cc of contrast.  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 Mild atherosclerosis. No  dissection.  Aortic Valve:  Trileaflet.  No calcifications.  Coronary Arteries:  Normal coronary origin.  Right dominance.  RCA is a large dominant artery that gives rise to PDA and PLVB. There is no plaque.  Left main is a large artery that gives rise to LAD and LCX arteries.  LAD is a large vessel that gives rise to large D1, smaller D2 and very small D3; there is no plaque.  LCX is a non-dominant artery that gives rise to OM1 and smaller  OM2. There is no plaque.  Other findings:  Normal pulmonary vein drainage into the left atrium.  Normal let atrial appendage without a thrombus.  Normal size of the pulmonary artery.  IMPRESSION: 1. Coronary calcium score of 0. This was 0 percentile for age and sex matched control.  2. Normal coronary origin with right dominance.  3. No evidence of CAD; CADRADS-0.  4. 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  Olga Millers   Electronically Signed By: Olga Millers M.D. On: 05/14/2019 13:02  Narrative EXAM: OVER-READ INTERPRETATION  CT CHEST  The following report is an over-read performed by radiologist Dr. Richarda Overlie of [facility] on 05/14/2019. This over-read does not include interpretation of cardiac or coronary anatomy or pathology. The coronary calcium score/coronary CTA interpretation by the cardiologist is attached.  COMPARISON:  Chest CT 01/29/2019  FINDINGS: Vascular: Mild enlargement of the ascending thoracic aorta measuring up to 3.9 cm and stable. No evidence for a dissection in the visualized thoracic aorta. The descending thoracic aorta measures up to 3.1 cm. Heart size is grossly stable. No significant pericardial fluid. Pulmonary arteries are not well opacified on this examination.  Mediastinum/Nodes: Mediastinal structures are unremarkable. No significant lymph node enlargement.  Lungs/Pleura: Lobulated peripheral lesion in the right lower lobe on sequence 12 image 2 measures 2.3 x 1.3  cm and measured 2.3 x 1.2 cm on previous examination. Lesion is incompletely imaged on this examination. No large pleural effusions.Visualized left lung clear.  Upper Abdomen: Again noted is elevation of the right hemidiaphragm. There is a large calcified gallstone and that measures up to 2.7 cm.  Musculoskeletal: No acute bone abnormality.  IMPRESSION: 1. 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 2. Partially visualized lesion in the right lower lobe. Lesion has minimally changed since 01/29/2019 but incompletely evaluated on this examination. 3. Cholelithiasis.  Electronically Signed: By: Richarda Overlie M.D. On: 05/14/2019 11:36         Vascular lower extremity ultrasound 07/27/2022: RIGHT:  - There is no evidence of deep vein thrombosis in the lower extremity.    - No cystic structure found in the popliteal fossa.    LEFT:  - No evidence of common femoral vein obstruction.   EKG: 01/10/2022: Sinus bradycardia 53 with first-degree AV block  Recent Labs: 06/28/2022: ALT 33; BUN 16; Creatinine 0.99; Hemoglobin 12.8; Platelet Count 210; Potassium 3.5; Sodium 137  Recent Lipid Panel No results found for: "CHOL", "TRIG", "HDL", "CHOLHDL", "VLDL", "LDLCALC", "LDLDIRECT"   Risk Assessment/Calculations:              Physical Exam:    VS:  BP (!) 146/94   Pulse 60   Ht 6' (1.829 m)   Wt 240 lb (108.9 kg)   SpO2 99%   BMI 32.55 kg/m     Wt Readings from Last 3 Encounters:  08/10/22 240 lb (108.9 kg)  06/28/22 235 lb 9.6 oz (106.9 kg)  05/17/22 245 lb (111.1 kg)     GEN:  Well nourished, well developed in no acute distress HEENT: Normal NECK: No JVD; No carotid bruits LYMPHATICS: No lymphadenopathy CARDIAC: RRR, no murmurs, rubs, gallops RESPIRATORY:  Clear to auscultation without rales, wheezing or rhonchi  ABDOMEN: Soft, non-tender, non-distended MUSCULOSKELETAL: Fairly tense right lower extremity compared to left.  Varicose veins noted  mostly on left  SKIN: Warm and dry NEUROLOGIC:  Alert and oriented x 3 PSYCHIATRIC:  Normal affect   ASSESSMENT:    1. First degree  AV block   2. Primary hypertension   3. Sleep apnea, unspecified type   4. Lower extremity edema, right    PLAN:    In order of problems listed above:  Right greater than left lower extremity edema -No DVT by Doppler -Antibiotic did not help. -Recommended leg elevation wedge pillows, consider Ace wraps -I will refer to vascular and vein given his varicose veins and possible venous insufficiency.  I will also give him additional Lasix 40 mg once a day.  He has not had a vigorous response to 20 mg a day.  Mildly dilated aorta - 40 mm in 2021 on coronary calcium score.  Stable.  Echocardiogram in 2023 showed no evidence of aortic dilatation.  His father died suddenly from aortic rupture.  Hypertension - Has been elevated in the past during office visits.  Multidrug regimen noted as described above.  Hyperlipidemia - Crestor 5 mg a day.  Prior cholesterol LDL 63.  Obstructive sleep apnea Compliant with CPAP  First-degree AV block - Previously had noted sinus bradycardia with first-degree AV block.  Decreased Toprol at prior visit from 75 down to 50.          Medication Adjustments/Labs and Tests Ordered: Current medicines are reviewed at length with the patient today.  Concerns regarding medicines are outlined above.  Orders Placed This Encounter  Procedures   Ambulatory referral to Vascular Surgery   Meds ordered this encounter  Medications   furosemide (LASIX) 40 MG tablet    Sig: Take 1 tablet (40 mg total) by mouth daily.    Dispense:  90 tablet    Refill:  3    Patient Instructions  Medication Instructions:  Your physician has recommended you make the following change in your medication:   Increase: Lasix 40mg  daily  *If you need a refill on your cardiac medications before your next appointment, please call your  pharmacy*  Follow-Up: At [facility], you and your health needs are our priority.  As part of our continuing mission to provide you with exceptional heart care, we have created designated Provider Care Teams.  These Care Teams include your primary Cardiologist (physician) and Advanced Practice Providers (APPs -  Physician Assistants and Nurse Practitioners) who all work together to provide you with the care you need, when you need it.  We recommend signing up for the patient portal called "MyChart".  Sign up information is provided on this After Visit Summary.  MyChart is used to connect with patients for Virtual Visits (Telemedicine).  Patients are able to view lab/test results, encounter notes, upcoming appointments, etc.  Non-urgent messages can be sent to your provider as well.   To learn more about what you can do with MyChart, go to [email or web address].    Your next appointment:   1 year(s) with Dr. Anne Fu    Other Instructions We have referred you to [facility] for Right Lower Extremity Edema     Signed, Donato Schultz, MD  08/10/2022 2:05 PM    [facility]

## 2022-08-17 ENCOUNTER — Other Ambulatory Visit (HOSPITAL_COMMUNITY): Payer: Self-pay

## 2022-08-18 ENCOUNTER — Other Ambulatory Visit: Payer: Self-pay | Admitting: *Deleted

## 2022-08-18 DIAGNOSIS — M79661 Pain in right lower leg: Secondary | ICD-10-CM

## 2022-08-22 ENCOUNTER — Inpatient Hospital Stay: Payer: Medicare Other | Attending: Internal Medicine

## 2022-08-22 DIAGNOSIS — Z79899 Other long term (current) drug therapy: Secondary | ICD-10-CM | POA: Diagnosis not present

## 2022-08-22 DIAGNOSIS — Z902 Acquired absence of lung [part of]: Secondary | ICD-10-CM | POA: Insufficient documentation

## 2022-08-22 DIAGNOSIS — C3490 Malignant neoplasm of unspecified part of unspecified bronchus or lung: Secondary | ICD-10-CM

## 2022-08-22 DIAGNOSIS — C3411 Malignant neoplasm of upper lobe, right bronchus or lung: Secondary | ICD-10-CM | POA: Diagnosis not present

## 2022-08-22 LAB — CMP (CANCER CENTER ONLY)
ALT: 22 U/L (ref 0–44)
AST: 26 U/L (ref 15–41)
Albumin: 4 g/dL (ref 3.5–5.0)
Alkaline Phosphatase: 84 U/L (ref 38–126)
Anion gap: 5 (ref 5–15)
BUN: 19 mg/dL (ref 8–23)
CO2: 33 mmol/L — ABNORMAL HIGH (ref 22–32)
Calcium: 9.5 mg/dL (ref 8.9–10.3)
Chloride: 98 mmol/L (ref 98–111)
Creatinine: 0.99 mg/dL (ref 0.61–1.24)
GFR, Estimated: >60 mL/min (ref >60)
Glucose, Bld: 102 mg/dL — ABNORMAL HIGH (ref 70–99)
Potassium: 3.6 mmol/L (ref 3.5–5.1)
Sodium: 136 mmol/L (ref 135–145)
Total Bilirubin: 0.7 mg/dL (ref 0.3–1.2)
Total Protein: 6.6 g/dL (ref 6.5–8.1)

## 2022-08-22 LAB — CBC WITH DIFFERENTIAL (CANCER CENTER ONLY)
Abs Immature Granulocytes: 0.03 10*3/uL (ref 0.00–0.07)
Basophils Absolute: 0 10*3/uL (ref 0.0–0.1)
Basophils Relative: 1 %
Eosinophils Absolute: 0.4 10*3/uL (ref 0.0–0.5)
Eosinophils Relative: 7 %
HCT: 34.3 % — ABNORMAL LOW (ref 39.0–52.0)
Hemoglobin: 11.9 g/dL — ABNORMAL LOW (ref 13.0–17.0)
Immature Granulocytes: 1 %
Lymphocytes Relative: 42 %
Lymphs Abs: 2.3 10*3/uL (ref 0.7–4.0)
MCH: 34 pg (ref 26.0–34.0)
MCHC: 34.7 g/dL (ref 30.0–36.0)
MCV: 98 fL (ref 80.0–100.0)
Monocytes Absolute: 0.7 10*3/uL (ref 0.1–1.0)
Monocytes Relative: 12 %
Neutro Abs: 2 10*3/uL (ref 1.7–7.7)
Neutrophils Relative %: 37 %
Platelet Count: 186 10*3/uL (ref 150–400)
RBC: 3.5 MIL/uL — ABNORMAL LOW (ref 4.22–5.81)
RDW: 12.9 % (ref 11.5–15.5)
WBC Count: 5.5 10*3/uL (ref 4.0–10.5)
nRBC: 0 % (ref 0.0–0.2)

## 2022-08-23 ENCOUNTER — Ambulatory Visit (HOSPITAL_COMMUNITY)
Admission: RE | Admit: 2022-08-23 | Discharge: 2022-08-23 | Disposition: A | Discharge status: Home / Self Care | Payer: Medicare Other | Source: Ambulatory Visit | Attending: Internal Medicine | Admitting: Internal Medicine

## 2022-08-23 DIAGNOSIS — C3490 Malignant neoplasm of unspecified part of unspecified bronchus or lung: Secondary | ICD-10-CM | POA: Diagnosis not present

## 2022-08-23 DIAGNOSIS — J479 Bronchiectasis, uncomplicated: Secondary | ICD-10-CM | POA: Diagnosis not present

## 2022-08-23 DIAGNOSIS — N281 Cyst of kidney, acquired: Secondary | ICD-10-CM | POA: Diagnosis not present

## 2022-08-23 MED ORDER — IOHEXOL 300 MG/ML  SOLN
100.0000 mL | Freq: Once | INTRAMUSCULAR | Status: AC | PRN
  Administered 2022-08-23: 100 mL via INTRAVENOUS

## 2022-08-25 ENCOUNTER — Other Ambulatory Visit (HOSPITAL_COMMUNITY): Payer: Self-pay

## 2022-08-25 ENCOUNTER — Inpatient Hospital Stay (HOSPITAL_BASED_OUTPATIENT_CLINIC_OR_DEPARTMENT_OTHER): Payer: Medicare Other | Admitting: Internal Medicine

## 2022-08-25 VITALS — BP 127/70 | HR 55 | Temp 97.5°F | Resp 18 | Wt 232.0 lb

## 2022-08-25 DIAGNOSIS — C3431 Malignant neoplasm of lower lobe, right bronchus or lung: Secondary | ICD-10-CM

## 2022-08-25 DIAGNOSIS — C3411 Malignant neoplasm of upper lobe, right bronchus or lung: Secondary | ICD-10-CM | POA: Diagnosis not present

## 2022-08-25 DIAGNOSIS — Z79899 Other long term (current) drug therapy: Secondary | ICD-10-CM | POA: Diagnosis not present

## 2022-08-25 DIAGNOSIS — Z902 Acquired absence of lung [part of]: Secondary | ICD-10-CM | POA: Diagnosis not present

## 2022-08-25 NOTE — Progress Notes (Signed)
[facility] Telephone:([phone]) [phone]   [phone]  OFFICE PROGRESS NOTE  [name], MD 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  PRIOR THERAPY: 1) Status post right upper lobectomy under the care of Dr. Dorris Fetch on 01/23/2006.  2) Tarceva 150 mg by mouth daily as part of the BMS checkmate 370 clinical trial. Status post 6 weeks of treatment.  CURRENT THERAPY: Tarceva 100 mg by mouth daily as part of the BMS Checkmate 370 clinical trial. First dose started 07/17/2014 status post 97 months of treatment.  INTERVAL HISTORY: [name] 77 y.o. male returns to the clinic today for follow-up visit.  The patient is feeling fine today with no concerning complaints except for swelling of the right leg and he supposed to have Doppler by Dr. Caro Hight next week.  He denied having any chest pain, shortness of breath, cough or hemoptysis.  He has no nausea, vomiting, diarrhea or constipation.  He continues to have mild skin rash.  He has been tolerating his treatment with Tarceva fairly well.  He is here today for evaluation with repeat CT scan of the chest, abdomen and pelvis for restaging of his disease.  MEDICAL HISTORY: Past Medical History:  Diagnosis Date   Cholelithiases 07/16/2015   Complication of anesthesia 2007   quit breathing with bronchoscopy, had to spend night   History of agent Orange exposure 44-45 yrs ago   History of pneumonia  last 3-4 yrs ago   3 -4 different times   Hypercholesterolemia    Hypertension    Hypothyroidism    lung ca dx'd 2007   lung right   Sinus disease    hx of   Sleep apnea    uses cpap setting opf 12    ALLERGIES:  is allergic to lisinopril and prednisone.  MEDICATIONS:  Current Outpatient Medications  Medication Sig Dispense  Refill   amitriptyline (ELAVIL) 50 MG tablet Take 50 mg by mouth at bedtime.     aspirin 81 MG tablet Take 81 mg by mouth every morning.      Cholecalciferol 2000 units CAPS Take 2,000 Units by mouth daily.      clindamycin (CLEOCIN T) 1 % external solution APPLY TO AFFECTED AREA(S) TWO TIMES A DAY AS NEEDED FOR SKIN IRRITATION 30 mL 0   cloNIDine (CATAPRES) 0.1 MG tablet Take 0.1 mg by mouth daily.     cloNIDine (CATAPRES) 0.2 MG tablet Take 0.2 mg by mouth daily.     clotrimazole (LOTRIMIN) 1 % cream Apply 1 Application topically 2 (two) times daily.     erlotinib (TARCEVA) 100 MG tablet Take 1 tablet (100 mg total) by mouth daily. Take on an empty stomach 1 hour before meals or 2 hours after 30 tablet 2   furosemide (LASIX) 40 MG tablet Take 1 tablet (40 mg total) by mouth daily. 90 tablet 3   levETIRAcetam (KEPPRA) 750 MG tablet Take 750 mg by mouth at bedtime.      metoprolol succinate (TOPROL-XL) 50 MG 24 hr tablet Take 1 tablet (50 mg total) by mouth daily. Take with or immediately following a meal. 90 tablet 3   Multiple Vitamins-Minerals (CENTRUM SILVER PO) Take 1 tablet by mouth daily.      nitroGLYCERIN (NITROSTAT) 0.4 MG SL tablet DISSOLVE 1 TAB  UNDER TONGUE FOR CHEST PAIN - IF PAIN REMAINS AFTER 5 MIN, CALL 911 AND REPEAT DOSE. MAX 3 TABS IN 15 MINUTES (Patient not taking: Reported on 08/10/2022) 25 tablet 10   polyethylene glycol (MIRALAX / GLYCOLAX) packet Take 17 g by mouth daily. Reported on 08/27/2015     psyllium (METAMUCIL) 58.6 % powder Take 1 packet by mouth daily. Reported on 08/27/2015     rosuvastatin (CRESTOR) 5 MG tablet Take 5 mg by mouth every morning.      SYNTHROID 112 MCG tablet Take 112 mcg by mouth daily.     valsartan-hydrochlorothiazide (DIOVAN-HCT) 320-25 MG per tablet Take 1 tablet by mouth every morning.      No current facility-administered medications for this visit.    SURGICAL HISTORY:  Past Surgical History:  Procedure Laterality Date   BIOPSY   05/21/2018   Procedure: BIOPSY;  Surgeon: Graylin Shiver, MD;  Location: [facility];  Service: Endoscopy;;   BIOPSY  05/11/2021   Procedure: BIOPSY;  Surgeon: Kathi Der, MD;  Location: [facility];  Service: Gastroenterology;;   COLONOSCOPY WITH PROPOFOL N/A 03/19/2013   Procedure: COLONOSCOPY WITH PROPOFOL;  Surgeon: Charolett Bumpers, MD;  Location: [facility];  Service: Endoscopy;  Laterality: N/A;   COLONOSCOPY WITH PROPOFOL N/A 05/11/2021   Procedure: COLONOSCOPY WITH PROPOFOL;  Surgeon: Kathi Der, MD;  Location: [facility];  Service: Gastroenterology;  Laterality: N/A;   Cystourethroscopy, [facility] TURP.  04/16/2010   ESOPHAGOGASTRODUODENOSCOPY (EGD) WITH PROPOFOL N/A 05/21/2018   Procedure: ESOPHAGOGASTRODUODENOSCOPY (EGD) WITH PROPOFOL;  Surgeon: Graylin Shiver, MD;  Location: [facility];  Service: Endoscopy;  Laterality: N/A;   NASAL SINUS SURGERY  1987   POLYPECTOMY  05/11/2021   Procedure: POLYPECTOMY;  Surgeon: Kathi Der, MD;  Location: [facility];  Service: Gastroenterology;;   Right video-assisted thoracoscopy, right upper lobectomy and  01/23/2006   The Olympus video bronchoscope was introduced via the right  12/21/2005   TONSILLECTOMY  1957   Torn medial and lateral menisci, left knee.  11/06/2006    REVIEW OF SYSTEMS:  Constitutional: negative Eyes: negative Ears, nose, mouth, throat, and face: negative Respiratory: negative Cardiovascular: negative Gastrointestinal: negative Genitourinary:negative Integument/breast: negative Hematologic/lymphatic: negative Musculoskeletal:negative Neurological: negative Behavioral/Psych: negative Endocrine: negative Allergic/Immunologic: negative   PHYSICAL EXAMINATION: General appearance: alert, cooperative, and no distress Head: Normocephalic, without obvious abnormality, atraumatic Neck: no adenopathy, no JVD, supple, symmetrical, trachea midline, and thyroid not enlarged, symmetric, no  tenderness/mass/nodules Lymph nodes: Cervical, supraclavicular, and axillary nodes normal. Resp: clear to auscultation bilaterally Back: symmetric, no curvature. ROM normal. No CVA tenderness. Cardio: regular rate and rhythm, S1, S2 normal, no murmur, click, rub or gallop GI: soft, non-tender; bowel sounds normal; no masses,  no organomegaly Extremities: extremities normal, atraumatic, no cyanosis or edema Neurologic: Alert and oriented X 3, normal strength and tone. Normal symmetric reflexes. Normal coordination and gait  ECOG PERFORMANCE STATUS: 0 - Asymptomatic  Blood pressure 127/70, pulse (!) 55, temperature (!) 97.5 F (36.4 C), temperature source Oral, resp. rate 18, weight 232 lb 0.4 oz (105.2 kg), SpO2 96 %.  LABORATORY DATA: Lab Results  Component Value Date   WBC 5.5 08/22/2022   HGB 11.9 (L) 08/22/2022   HCT 34.3 (L) 08/22/2022   MCV 98.0 08/22/2022   PLT 186 08/22/2022      Chemistry      Component Value Date/Time   NA 136 08/22/2022 0846   NA 141 03/11/2019 0959   NA 139 04/06/2017 0932   K 3.6 08/22/2022  0846   K 3.8 04/06/2017 0932   CL 98 08/22/2022 0846   CL 102 04/24/2012 0853   CO2 33 (H) 08/22/2022 0846   CO2 29 04/06/2017 0932   BUN 19 08/22/2022 0846   BUN 18 03/11/2019 0959   BUN 14.4 04/06/2017 0932   CREATININE 0.99 08/22/2022 0846   CREATININE 1.0 04/06/2017 0932      Component Value Date/Time   CALCIUM 9.5 08/22/2022 0846   CALCIUM 9.5 04/06/2017 0932   ALKPHOS 84 08/22/2022 0846   ALKPHOS 58 04/06/2017 0932   AST 26 08/22/2022 0846   AST 27 04/06/2017 0932   ALT 22 08/22/2022 0846   ALT 27 04/06/2017 0932   BILITOT 0.7 08/22/2022 0846   BILITOT 0.45 04/06/2017 0932       RADIOGRAPHIC STUDIES: VAS [place] LOWER EXTREMITY VENOUS (DVT)  Result Date: 07/27/2022  Lower Venous DVT Study Patient Name:  [name]  Date of Exam:   07/27/2022 Medical Rec #: [id number]        Accession #:    [id number] Date of Birth: 11-21-1945         Patient Gender: M Patient Age:   40 years Exam Location:  [facility] Procedure:      VAS [place] LOWER EXTREMITY VENOUS (DVT) Referring Phys: Jerelyn Scott HUSAIN --------------------------------------------------------------------------------  Indications: Pain.  Risk Factors: None identified. Limitations: Poor ultrasound/tissue interface. Comparison Study: No prior studies. Performing Technologist: Chanda Busing RVT  Examination Guidelines: A complete evaluation includes B-mode imaging, spectral Doppler, color Doppler, and power Doppler as needed of all accessible portions of each vessel. Bilateral testing is considered an integral part of a complete examination. Limited examinations for reoccurring indications may be performed as noted. The reflux portion of the exam is performed with the patient in reverse Trendelenburg.  +---------+---------------+---------+-----------+----------+--------------+ RIGHT    CompressibilityPhasicitySpontaneityPropertiesThrombus Aging +---------+---------------+---------+-----------+----------+--------------+ CFV      Full           Yes      Yes                                 +---------+---------------+---------+-----------+----------+--------------+ SFJ      Full                                                        +---------+---------------+---------+-----------+----------+--------------+ FV Prox  Full                                                        +---------+---------------+---------+-----------+----------+--------------+ FV Mid   Full                                                        +---------+---------------+---------+-----------+----------+--------------+ FV DistalFull                                                        +---------+---------------+---------+-----------+----------+--------------+  PFV      Full                                                         +---------+---------------+---------+-----------+----------+--------------+ POP      Full           Yes      Yes                                 +---------+---------------+---------+-----------+----------+--------------+ PTV      Full                                                        +---------+---------------+---------+-----------+----------+--------------+ PERO     Full                                                        +---------+---------------+---------+-----------+----------+--------------+   +----+---------------+---------+-----------+----------+--------------+ LEFTCompressibilityPhasicitySpontaneityPropertiesThrombus Aging +----+---------------+---------+-----------+----------+--------------+ CFV Full           Yes      Yes                                 +----+---------------+---------+-----------+----------+--------------+    Summary: RIGHT: - There is no evidence of deep vein thrombosis in the lower extremity.  - No cystic structure found in the popliteal fossa.  LEFT: - No evidence of common femoral vein obstruction.  *See table(s) above for measurements and observations. Electronically signed by Sherald Hess MD on 07/27/2022 at 4:25:51 PM.    Final      ASSESSMENT AND PLAN:  This is a very pleasant 77 years old white male with recurrent non-small cell lung cancer, adenocarcinoma and positive EGFR mutation in exon 21. He is currently on Tarceva 100 mg by mouth daily status post around 97 months of treatment. The patient has been tolerating his treatment with Tarceva fairly well with no concerning adverse effects. The patient is feeling fine today with no concerning complaints. He had repeat CT scan of the chest, abdomen and pelvis performed recently.  The final report is still pending but I personally and independently reviewed the scan images in comparison to the previous 1 and I do not see any clear evidence for disease progression but I will wait  for the report for confirmation. I recommend for the patient to continue his current treatment with Tarceva with the same dose. I will see him back for follow-up visit in 2 months with repeat blood work. He was advised to call immediately if he has any other concerning symptoms in the interval. The patient voices understanding of current disease status and treatment options and is in agreement with the current care plan. All questions were answered. The patient knows to call the clinic with any problems, questions or concerns. We can certainly see the patient much sooner if necessary. The total time spent in the appointment was 30  minutes.  Disclaimer: This note was dictated with voice recognition software. Similar sounding words can inadvertently be transcribed and may not be corrected upon review.

## 2022-08-30 ENCOUNTER — Ambulatory Visit (INDEPENDENT_AMBULATORY_CARE_PROVIDER_SITE_OTHER): Payer: Medicare Other | Admitting: Thoracic Surgery (Cardiothoracic Vascular Surgery)

## 2022-08-30 ENCOUNTER — Encounter: Payer: Self-pay | Admitting: Thoracic Surgery (Cardiothoracic Vascular Surgery)

## 2022-08-30 ENCOUNTER — Other Ambulatory Visit (HOSPITAL_COMMUNITY): Payer: Self-pay

## 2022-08-30 VITALS — BP 180/92 | HR 66 | Resp 20 | Ht 72.0 in | Wt 232.0 lb

## 2022-08-30 DIAGNOSIS — C3431 Malignant neoplasm of lower lobe, right bronchus or lung: Secondary | ICD-10-CM

## 2022-08-30 DIAGNOSIS — Z902 Acquired absence of lung [part of]: Secondary | ICD-10-CM | POA: Diagnosis not present

## 2022-08-30 NOTE — Progress Notes (Signed)
[address]       [name] [address]             [phone]     HPI: [name] returns for follow-up of his abnormal CT.  [name] is a 77 year old male with a history of a right upper lobectomy for adenocarcinoma of the lung in 2007.  He developed a recurrence 10 years later.  Biopsy showed adenocarcinoma and molecular testing was positive for EGFR.  He is being treated with Tarceva and followed by Dr. Arbutus Ped.  I saw him in February.  He had a CT that showed some lung nodules are relatively stable.  There was a question of increasing subcarinal adenopathy.  I felt like it was probably stable from his previous scan.  In the interim since his last visit he has been feeling well.  His weight is stable.  No unusual headaches or visual changes.  Remains relatively active.  Past Medical History:  Diagnosis Date   Cholelithiases 07/16/2015   Complication of anesthesia 2007   quit breathing with bronchoscopy, had to spend night   History of agent Orange exposure 44-45 yrs ago   History of pneumonia  last 3-4 yrs ago   3 -4 different times   Hypercholesterolemia    Hypertension    Hypothyroidism    lung ca dx'd 2007   lung right   Sinus disease    hx of   Sleep apnea    uses cpap setting opf 12    Current Outpatient Medications  Medication Sig Dispense Refill   amitriptyline (ELAVIL) 50 MG tablet Take 50 mg by mouth at bedtime.     aspirin 81 MG tablet Take 81 mg by mouth every morning.      Cholecalciferol 2000 units CAPS Take 2,000 Units by mouth daily.      clindamycin (CLEOCIN T) 1 % external solution APPLY TO AFFECTED AREA(S) TWO TIMES A DAY AS NEEDED FOR SKIN IRRITATION 30 mL 0   cloNIDine (CATAPRES) 0.1 MG tablet Take 0.1 mg by mouth daily.     cloNIDine (CATAPRES) 0.2 MG tablet Take 0.2 mg by mouth daily.     erlotinib (TARCEVA) 100 MG tablet Take 1 tablet (100 mg total) by mouth daily. Take on an empty stomach 1 hour before meals or 2 hours  after 30 tablet 2   furosemide (LASIX) 40 MG tablet Take 1 tablet (40 mg total) by mouth daily. 90 tablet 3   levETIRAcetam (KEPPRA) 750 MG tablet Take 750 mg by mouth at bedtime.      metoprolol succinate (TOPROL-XL) 50 MG 24 hr tablet Take 1 tablet (50 mg total) by mouth daily. Take with or immediately following a meal. 90 tablet 3   Multiple Vitamins-Minerals (CENTRUM SILVER PO) Take 1 tablet by mouth daily.      nitroGLYCERIN (NITROSTAT) 0.4 MG SL tablet DISSOLVE 1 TAB UNDER TONGUE FOR CHEST PAIN - IF PAIN REMAINS AFTER 5 MIN, CALL 911 AND REPEAT DOSE. MAX 3 TABS IN 15 MINUTES 25 tablet 10   polyethylene glycol (MIRALAX / GLYCOLAX) packet Take 17 g by mouth daily. Reported on 08/27/2015     psyllium (METAMUCIL) 58.6 % powder Take 1 packet by mouth daily. Reported on 08/27/2015     rosuvastatin (CRESTOR) 5 MG tablet Take 5 mg by mouth every morning.      SYNTHROID 112 MCG tablet Take 112 mcg by mouth daily.     valsartan-hydrochlorothiazide (DIOVAN-HCT) 320-25 MG  per tablet Take 1 tablet by mouth every morning.      No current facility-administered medications for this visit.    Physical Exam BP (!) 180/92 (BP Location: Left Arm, Patient Position: Sitting, Cuff Size: Normal)   Pulse 66   Resp 20   Ht 6' (1.829 m)   Wt 232 lb (105.2 kg)   SpO2 96% Comment: RA  BMI 31.59 kg/m  77 year old man in no acute distress Well-developed well-nourished Alert and oriented x 3 with no focal deficits Lungs diminished at right base otherwise clear Cardiac regular rate and rhythm Right leg compression stocking with mild edema  Diagnostic Tests: CT CHEST FINDINGS   Cardiovascular: Aortic atherosclerosis. Tortuous thoracic aorta. Normal heart size, without pericardial effusion. No central pulmonary embolism, on this non-dedicated study.   Mediastinum/Nodes: No supraclavicular adenopathy. Subcarinal node measures 1.0 cm on 28/2 versus 1.4 cm on the prior exam (when remeasured). No hilar  adenopathy.   Lungs/Pleura: No pleural fluid. Right hemidiaphragm elevation. Status post right upper lobectomy.   The areas of right lower lobe bandlike consolidation and traction bronchiectasis are similar and presumably radiation induced. Posteromedial right lower lobe nodule measures 2.3 x 1.9 cm on 45/4 versus 2.2 x 2.2 cm on the prior, suggesting stability.   Just caudal to this, an irregular nodule measures 1.5 cm on 62/4 and is similar (when remeasured).   More anterior right lower lobe area of nodularity measures 1.5 cm on 50/4 and is unchanged (when remeasured).   Musculoskeletal: No acute osseous abnormality.   CT ABDOMEN PELVIS FINDINGS   Hepatobiliary: Normal liver. 1.6 cm gallstone without acute cholecystitis or biliary duct dilatation.   Pancreas: Normal, without mass or ductal dilatation.   Spleen: Normal in size, without focal abnormality.   Adrenals/Urinary Tract: Normal adrenal glands. Exophytic anterior upper pole 3.4 cm right renal cyst . In the absence of clinically indicated signs/symptoms require(s) no independent follow-up. No hydronephrosis. The bladder appears mildly thick-walled, most likely due to underdistention.   Stomach/Bowel: Normal stomach, without wall thickening. Extensive colonic diverticulosis. Colonic stool burden suggests constipation. Normal terminal ileum and appendix. Normal small bowel.   Vascular/Lymphatic: Aortic atherosclerosis. No abdominopelvic adenopathy.   Reproductive: Mild prostatomegaly.   Other: Small fat containing left inguinal hernia. No significant free fluid. No evidence of omental or peritoneal disease.   Musculoskeletal: No acute osseous abnormality.   IMPRESSION: 1. Status post right lower lobectomy. Areas of bandlike consolidation in the right lung are likely radiation induced. Areas of nodularity are suspicious for residual/metastatic disease but not significantly changed. 2. Improvement/resolution of  thoracic adenopathy. 3. No acute process or evidence of metastatic disease in the abdomen or pelvis. 4. Incidental findings, including: Cholelithiasis. Prostatomegaly. Possible constipation.     Electronically Signed   By: Jeronimo Greaves M.D.   On: 08/25/2022 15:10 I personally reviewed the CT images.  Stable right lung nodularity stable to improved subcarinal adenopathy.  Impression: [name] is a 77 year old male with a history of a right upper lobectomy for adenocarcinoma of the lung in 2007.  He developed a recurrence 10 years later.  Biopsy showed adenocarcinoma and molecular testing was positive for EGFR.  He is being treated with Tarceva and followed by Dr. Arbutus Ped.  Recurrent adenocarcinoma of the lung-CT today shows stable to slightly improved adenopathy.  Lung nodules unchanged.  He is already seen Dr. Arbutus Ped and plan is to continue with Tarceva.  He is going to have another scan in few months.  Right lower extremity  edema-ultrasound negative for DVT.  Workup for underlying cause still pending.  Hypertension-blood pressure elevated today.  Likely due to concerns about CT findings.  Monitor at home and follow-up with primary.  Plan: Return in 4 months after CT chest  Loreli Slot, MD [facility] [phone]

## 2022-08-31 ENCOUNTER — Ambulatory Visit (HOSPITAL_COMMUNITY)
Admission: RE | Admit: 2022-08-31 | Discharge: 2022-08-31 | Disposition: A | Discharge status: Home / Self Care | Payer: Medicare Other | Source: Ambulatory Visit | Attending: Vascular Surgery | Admitting: Vascular Surgery

## 2022-08-31 ENCOUNTER — Other Ambulatory Visit (HOSPITAL_COMMUNITY): Payer: Self-pay

## 2022-08-31 DIAGNOSIS — M7989 Other specified soft tissue disorders: Secondary | ICD-10-CM

## 2022-08-31 DIAGNOSIS — M79661 Pain in right lower leg: Secondary | ICD-10-CM | POA: Insufficient documentation

## 2022-09-12 NOTE — Progress Notes (Unsigned)
[facility] [place]  ASSESSMENT / PLAN: [name] is a 77 y.o. male with chronic venous insufficiency of bilateral lower extremities causing swelling, chronic skin changes (C4 disease).  Venous duplex is significant for right-sided greater saphenous vein reflux, with palpable greater saphenous vein into the proximal calf. Recommend compression and elevation for symptomatic relief. Follow up in three months to discuss saphenous vein ablation.  CHIEF COMPLAINT: Swelling in legs.  HISTORY OF PRESENT ILLNESS: [name] is a 77 y.o. male who presents to clinic for evaluation of swelling in bilateral lower extremities, right worse than left.  This has been worsening over the past several months.  Swelling is bothersome to him, but not disabling.  He is also noticed brawny discoloration of his pretibial skin bilaterally.   Past Medical History:  Diagnosis Date   Cholelithiases 07/16/2015   Complication of anesthesia 2007   quit breathing with bronchoscopy, had to spend night   History of agent Orange exposure 44-45 yrs ago   History of pneumonia  last 3-4 yrs ago   3 -4 different times   Hypercholesterolemia    Hypertension    Hypothyroidism    lung ca dx'd 2007   lung right   Sinus disease    hx of   Sleep apnea    uses cpap setting opf 12    Past Surgical History:  Procedure Laterality Date   BIOPSY  05/21/2018   Procedure: BIOPSY;  Surgeon: Graylin Shiver, MD;  Location: [facility];  Service: Endoscopy;;   BIOPSY  05/11/2021   Procedure: BIOPSY;  Surgeon: Kathi Der, MD;  Location: [facility];  Service: Gastroenterology;;   COLONOSCOPY WITH PROPOFOL N/A 03/19/2013   Procedure: COLONOSCOPY WITH PROPOFOL;  Surgeon: Charolett Bumpers, MD;  Location: [facility];  Service: Endoscopy;  Laterality: N/A;   COLONOSCOPY WITH PROPOFOL N/A 05/11/2021   Procedure: COLONOSCOPY WITH PROPOFOL;  Surgeon: Kathi Der, MD;  Location: [facility];   Service: Gastroenterology;  Laterality: N/A;   Cystourethroscopy, [facility] TURP.  04/16/2010   ESOPHAGOGASTRODUODENOSCOPY (EGD) WITH PROPOFOL N/A 05/21/2018   Procedure: ESOPHAGOGASTRODUODENOSCOPY (EGD) WITH PROPOFOL;  Surgeon: Graylin Shiver, MD;  [facility]: [facility];  Service: Endoscopy;  Laterality: N/A;   NASAL SINUS SURGERY  1987   POLYPECTOMY  05/11/2021   Procedure: POLYPECTOMY;  Surgeon: Kathi Der, MD;  Location: [facility];  Service: Gastroenterology;;   Right video-assisted thoracoscopy, right upper lobectomy and  01/23/2006   The Olympus video bronchoscope was introduced via the right  12/21/2005   TONSILLECTOMY  1957   Torn medial and lateral menisci, left knee.  11/06/2006    Family History  Problem Relation Age of Onset   Hypertension Mother    Varicose Veins Mother    Heart attack Father    Heart attack Brother    Varicose Veins Sister     Social History   Socioeconomic History   Marital status: Married    Spouse name: Not on file   Number of children: 0   Years of education: Not on file   Highest education level: Not on file  Occupational History   Occupation: Air traffic controller    Comment: Retired  Tobacco Use   Smoking status: Never   Smokeless tobacco: Never  Vaping Use   Vaping Use: Never used  Substance and Sexual Activity   Alcohol use: No    Alcohol/week: 0.0 standard drinks of alcohol   Drug use: No   Sexual activity: Not Currently  Other Topics Concern   Not on file  Social History Narrative   Not on file   Social Determinants of Health   Financial Resource Strain: Not on file  Food Insecurity: Not on file  Transportation Needs: Not on file  Physical Activity: Not on file  Stress: Not on file  Social Connections: Not on file  Intimate Partner Violence: Not on file    Allergies  Allergen Reactions   Lisinopril Cough   Prednisone Rash    Current Outpatient Medications  Medication Sig Dispense Refill   amitriptyline  (ELAVIL) 50 MG tablet Take 50 mg by mouth at bedtime.     aspirin 81 MG tablet Take 81 mg by mouth every morning.      Cholecalciferol 2000 units CAPS Take 2,000 Units by mouth daily.      clindamycin (CLEOCIN T) 1 % external solution APPLY TO AFFECTED AREA(S) TWO TIMES A DAY AS NEEDED FOR SKIN IRRITATION 30 mL 0   cloNIDine (CATAPRES) 0.1 MG tablet Take 0.1 mg by mouth daily.     cloNIDine (CATAPRES) 0.2 MG tablet Take 0.2 mg by mouth daily.     erlotinib (TARCEVA) 100 MG tablet Take 1 tablet (100 mg total) by mouth daily. Take on an empty stomach 1 hour before meals or 2 hours after 30 tablet 2   furosemide (LASIX) 40 MG tablet Take 1 tablet (40 mg total) by mouth daily. 90 tablet 3   levETIRAcetam (KEPPRA) 750 MG tablet Take 750 mg by mouth at bedtime.      metoprolol succinate (TOPROL-XL) 50 MG 24 hr tablet Take 1 tablet (50 mg total) by mouth daily. Take with or immediately following a meal. 90 tablet 3   Multiple Vitamins-Minerals (CENTRUM SILVER PO) Take 1 tablet by mouth daily.      nitroGLYCERIN (NITROSTAT) 0.4 MG SL tablet DISSOLVE 1 TAB UNDER TONGUE FOR CHEST PAIN - IF PAIN REMAINS AFTER 5 MIN, CALL 911 AND REPEAT DOSE. MAX 3 TABS IN 15 MINUTES 25 tablet 10   polyethylene glycol (MIRALAX / GLYCOLAX) packet Take 17 g by mouth daily. Reported on 08/27/2015     psyllium (METAMUCIL) 58.6 % powder Take 1 packet by mouth daily. Reported on 08/27/2015     rosuvastatin (CRESTOR) 5 MG tablet Take 5 mg by mouth every morning.      SYNTHROID 112 MCG tablet Take 112 mcg by mouth daily.     valsartan-hydrochlorothiazide (DIOVAN-HCT) 320-25 MG per tablet Take 1 tablet by mouth every morning.      No current facility-administered medications for this visit.    PHYSICAL EXAM There were no vitals filed for this visit.  Well-appearing elderly man in no acute distress Regular rate and rhythm Unlabored breathing Pitting edema of calves bilaterally to the knees. Hemosiderin staining of pretibial skin  bilaterally Large varicosities and greater saphenous vein distribution bilaterally 2+ dorsalis pedis pulses  PERTINENT LABORATORY AND RADIOLOGIC DATA  Most recent CBC    Latest Ref Rng & Units 08/22/2022    8:46 AM 06/28/2022    8:01 AM 04/25/2022    8:51 AM  CBC  WBC 4.0 - 10.5 K/uL 5.5  6.0  5.7   Hemoglobin 13.0 - 17.0 g/dL 08.6  57.8  46.9   Hematocrit 39.0 - 52.0 % 34.3  36.9  38.5   Platelets 150 - 400 K/uL 186  210  222      Most recent CMP    Latest Ref Rng & Units 08/22/2022  8:46 AM 06/28/2022    8:01 AM 04/25/2022    8:51 AM  CMP  Glucose 70 - 99 mg/dL 034  97  92   BUN 8 - 23 mg/dL 19  16  16    Creatinine 0.61 - 1.24 mg/dL 7.42  5.95  6.38   Sodium 135 - 145 mmol/L 136  137  136   Potassium 3.5 - 5.1 mmol/L 3.6  3.5  3.6   Chloride 98 - 111 mmol/L 98  101  102   CO2 22 - 32 mmol/L 33  32  31   Calcium 8.9 - 10.3 mg/dL 9.5  9.7  9.6   Total Protein 6.5 - 8.1 g/dL 6.6  6.3  6.0   Total Bilirubin 0.3 - 1.2 mg/dL 0.7  0.8  0.7   Alkaline Phos 38 - 126 U/L 84  68  62   AST 15 - 41 U/L 26  35  33   ALT 0 - 44 U/L 22  33  28    Right lower extremity venous reflux study Right:  - No evidence of deep vein thrombosis seen in the right lower extremity,  from the common femoral through the popliteal veins.  - No evidence of superficial venous thrombosis in the right lower  extremity.    - The deep venous system is competent.  - The great saphenous vein is incompetent at the [facility] and proximal thigh,  before it travels out of the fascia.  - The small saphenous vein is competent.   Rande Brunt. Lenell Antu, MD [facility] Phone Number: [phone] 09/12/2022 12:06 PM   Total time spent on preparing this encounter including chart review, data review, collecting history, examining the patient, coordinating care for this new patient, 45 minutes.  Portions of this report may have been transcribed using voice recognition software.   Every effort has been made to ensure accuracy; however, inadvertent computerized transcription errors may still be present.

## 2022-09-13 ENCOUNTER — Encounter: Payer: Self-pay | Admitting: Vascular Surgery

## 2022-09-13 ENCOUNTER — Ambulatory Visit (INDEPENDENT_AMBULATORY_CARE_PROVIDER_SITE_OTHER): Payer: Medicare Other | Admitting: Vascular Surgery

## 2022-09-13 VITALS — BP 138/86 | HR 56 | Temp 98.4°F | Resp 20 | Ht 72.0 in | Wt 230.0 lb

## 2022-09-13 DIAGNOSIS — I872 Venous insufficiency (chronic) (peripheral): Secondary | ICD-10-CM

## 2022-09-13 DIAGNOSIS — I8393 Asymptomatic varicose veins of bilateral lower extremities: Secondary | ICD-10-CM

## 2022-09-20 ENCOUNTER — Other Ambulatory Visit (HOSPITAL_COMMUNITY): Payer: Self-pay

## 2022-09-22 ENCOUNTER — Other Ambulatory Visit (HOSPITAL_COMMUNITY): Payer: Self-pay

## 2022-09-22 ENCOUNTER — Other Ambulatory Visit: Payer: Self-pay

## 2022-09-22 ENCOUNTER — Other Ambulatory Visit: Payer: Self-pay | Admitting: Internal Medicine

## 2022-09-22 DIAGNOSIS — C3431 Malignant neoplasm of lower lobe, right bronchus or lung: Secondary | ICD-10-CM

## 2022-09-22 MED ORDER — ERLOTINIB HCL 100 MG PO TABS
100.0000 mg | ORAL_TABLET | Freq: Every day | ORAL | 2 refills | Status: DC
Start: 2022-09-22 — End: 2022-12-20
  Filled 2022-09-22: qty 30, 30d supply, fill #0
  Filled 2022-10-19: qty 30, 30d supply, fill #1
  Filled 2022-11-18: qty 30, 30d supply, fill #2

## 2022-09-26 ENCOUNTER — Other Ambulatory Visit (HOSPITAL_COMMUNITY): Payer: Self-pay

## 2022-09-28 DIAGNOSIS — Z961 Presence of intraocular lens: Secondary | ICD-10-CM | POA: Diagnosis not present

## 2022-09-28 DIAGNOSIS — H01002 Unspecified blepharitis right lower eyelid: Secondary | ICD-10-CM | POA: Diagnosis not present

## 2022-09-28 DIAGNOSIS — H52203 Unspecified astigmatism, bilateral: Secondary | ICD-10-CM | POA: Diagnosis not present

## 2022-09-28 DIAGNOSIS — H01005 Unspecified blepharitis left lower eyelid: Secondary | ICD-10-CM | POA: Diagnosis not present

## 2022-10-19 ENCOUNTER — Other Ambulatory Visit (HOSPITAL_COMMUNITY): Payer: Self-pay

## 2022-10-21 ENCOUNTER — Other Ambulatory Visit (HOSPITAL_COMMUNITY): Payer: Self-pay

## 2022-10-25 ENCOUNTER — Other Ambulatory Visit: Payer: Medicare Other

## 2022-10-25 ENCOUNTER — Ambulatory Visit: Payer: Medicare Other | Admitting: Internal Medicine

## 2022-10-27 ENCOUNTER — Inpatient Hospital Stay: Payer: Medicare Other | Attending: Internal Medicine

## 2022-10-27 ENCOUNTER — Other Ambulatory Visit: Payer: Self-pay

## 2022-10-27 ENCOUNTER — Inpatient Hospital Stay (HOSPITAL_BASED_OUTPATIENT_CLINIC_OR_DEPARTMENT_OTHER): Payer: Medicare Other | Admitting: Internal Medicine

## 2022-10-27 ENCOUNTER — Other Ambulatory Visit: Payer: Self-pay | Admitting: Internal Medicine

## 2022-10-27 VITALS — BP 155/71 | HR 58 | Temp 97.8°F | Resp 17 | Ht 72.0 in | Wt 235.7 lb

## 2022-10-27 DIAGNOSIS — C3490 Malignant neoplasm of unspecified part of unspecified bronchus or lung: Secondary | ICD-10-CM

## 2022-10-27 DIAGNOSIS — Z9079 Acquired absence of other genital organ(s): Secondary | ICD-10-CM | POA: Diagnosis not present

## 2022-10-27 DIAGNOSIS — Z9089 Acquired absence of other organs: Secondary | ICD-10-CM | POA: Insufficient documentation

## 2022-10-27 DIAGNOSIS — Z85118 Personal history of other malignant neoplasm of bronchus and lung: Secondary | ICD-10-CM | POA: Insufficient documentation

## 2022-10-27 DIAGNOSIS — I10 Essential (primary) hypertension: Secondary | ICD-10-CM | POA: Diagnosis not present

## 2022-10-27 DIAGNOSIS — C3411 Malignant neoplasm of upper lobe, right bronchus or lung: Secondary | ICD-10-CM | POA: Insufficient documentation

## 2022-10-27 DIAGNOSIS — R21 Rash and other nonspecific skin eruption: Secondary | ICD-10-CM | POA: Diagnosis not present

## 2022-10-27 DIAGNOSIS — Z888 Allergy status to other drugs, medicaments and biological substances status: Secondary | ICD-10-CM | POA: Insufficient documentation

## 2022-10-27 DIAGNOSIS — Z79899 Other long term (current) drug therapy: Secondary | ICD-10-CM | POA: Insufficient documentation

## 2022-10-27 DIAGNOSIS — C3431 Malignant neoplasm of lower lobe, right bronchus or lung: Secondary | ICD-10-CM

## 2022-10-27 LAB — CBC WITH DIFFERENTIAL (CANCER CENTER ONLY)
Abs Immature Granulocytes: 0.01 10*3/uL (ref 0.00–0.07)
Basophils Absolute: 0 10*3/uL (ref 0.0–0.1)
Basophils Relative: 1 %
Eosinophils Absolute: 0.4 10*3/uL (ref 0.0–0.5)
Eosinophils Relative: 7 %
HCT: 35.9 % — ABNORMAL LOW (ref 39.0–52.0)
Hemoglobin: 12.5 g/dL — ABNORMAL LOW (ref 13.0–17.0)
Immature Granulocytes: 0 %
Lymphocytes Relative: 34 %
Lymphs Abs: 1.8 10*3/uL (ref 0.7–4.0)
MCH: 34.2 pg — ABNORMAL HIGH (ref 26.0–34.0)
MCHC: 34.8 g/dL (ref 30.0–36.0)
MCV: 98.4 fL (ref 80.0–100.0)
Monocytes Absolute: 0.6 10*3/uL (ref 0.1–1.0)
Monocytes Relative: 11 %
Neutro Abs: 2.5 10*3/uL (ref 1.7–7.7)
Neutrophils Relative %: 47 %
Platelet Count: 222 10*3/uL (ref 150–400)
RBC: 3.65 MIL/uL — ABNORMAL LOW (ref 4.22–5.81)
RDW: 12.6 % (ref 11.5–15.5)
WBC Count: 5.4 10*3/uL (ref 4.0–10.5)
nRBC: 0 % (ref 0.0–0.2)

## 2022-10-27 LAB — CMP (CANCER CENTER ONLY)
ALT: 23 U/L (ref 0–44)
AST: 26 U/L (ref 15–41)
Albumin: 4.1 g/dL (ref 3.5–5.0)
Alkaline Phosphatase: 77 U/L (ref 38–126)
Anion gap: 5 (ref 5–15)
BUN: 14 mg/dL (ref 8–23)
CO2: 30 mmol/L (ref 22–32)
Calcium: 9.7 mg/dL (ref 8.9–10.3)
Chloride: 102 mmol/L (ref 98–111)
Creatinine: 0.91 mg/dL (ref 0.61–1.24)
GFR, Estimated: >60 mL/min (ref >60)
Glucose, Bld: 95 mg/dL (ref 70–99)
Potassium: 3.6 mmol/L (ref 3.5–5.1)
Sodium: 137 mmol/L (ref 135–145)
Total Bilirubin: 0.6 mg/dL (ref 0.3–1.2)
Total Protein: 6.7 g/dL (ref 6.5–8.1)

## 2022-10-27 NOTE — Progress Notes (Signed)
[facility] [phone]   [phone]  OFFICE PROGRESS NOTE  [name], MD 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  PRIOR THERAPY: 1) Status post right upper lobectomy under the care of Dr. Dorris Fetch on 01/23/2006.  2) Tarceva 150 mg by mouth daily as part of the BMS checkmate 370 clinical trial. Status post 6 weeks of treatment.  CURRENT THERAPY: Tarceva 100 mg by mouth daily as part of the BMS Checkmate 370 clinical trial. First dose started 07/17/2014 status post 99 months of treatment.  INTERVAL HISTORY: [name] 77 y.o. male returns to the clinic today for 2 months follow-up visit.  The patient is feeling fine today with no concerning complaints except for skin rash on the upper extremities that started recently after increasing his dose of Lasix to 40 mg p.o. daily.  He denied having any current chest pain, shortness of breath, cough or hemoptysis.  He has no nausea, vomiting, diarrhea or constipation.  He has no headache or visual changes.  He has no recent weight loss or night sweats.  He continues to tolerate his treatment with Tarceva fairly well.   MEDICAL HISTORY: Past Medical History:  Diagnosis Date   Cholelithiases 07/16/2015   Complication of anesthesia 2007   quit breathing with bronchoscopy, had to spend night   History of agent Orange exposure 44-45 yrs ago   History of pneumonia  last 3-4 yrs ago   3 -4 different times   Hypercholesterolemia    Hypertension    Hypothyroidism    lung ca dx'd 2007   lung right   Sinus disease    hx of   Sleep apnea    uses cpap setting opf 12    ALLERGIES:  is allergic to lisinopril and prednisone.  MEDICATIONS:  Current Outpatient Medications  Medication Sig Dispense Refill    amitriptyline (ELAVIL) 50 MG tablet Take 50 mg by mouth at bedtime.     aspirin 81 MG tablet Take 81 mg by mouth every morning.      Cholecalciferol 2000 units CAPS Take 2,000 Units by mouth daily.      clindamycin (CLEOCIN T) 1 % external solution APPLY TO AFFECTED AREA(S) TWO TIMES A DAY AS NEEDED FOR SKIN IRRITATION 30 mL 0   cloNIDine (CATAPRES) 0.1 MG tablet Take 0.1 mg by mouth daily.     cloNIDine (CATAPRES) 0.2 MG tablet Take 0.2 mg by mouth daily.     erlotinib (TARCEVA) 100 MG tablet Take 1 tablet (100 mg total) by mouth daily. Take on an empty stomach 1 hour before meals or 2 hours after 30 tablet 2   furosemide (LASIX) 40 MG tablet Take 1 tablet (40 mg total) by mouth daily. 90 tablet 3   levETIRAcetam (KEPPRA) 750 MG tablet Take 750 mg by mouth at bedtime.      metoprolol succinate (TOPROL-XL) 50 MG 24 hr tablet Take 1 tablet (50 mg total) by mouth daily. Take with or immediately following a meal. 90 tablet 3   Multiple Vitamins-Minerals (CENTRUM SILVER PO) Take 1 tablet by mouth daily.      nitroGLYCERIN (NITROSTAT) 0.4 MG SL tablet DISSOLVE 1 TAB UNDER TONGUE FOR CHEST PAIN - IF PAIN REMAINS AFTER 5 MIN, CALL 911 AND REPEAT DOSE. MAX 3 TABS IN  15 MINUTES 25 tablet 10   polyethylene glycol (MIRALAX / GLYCOLAX) packet Take 17 g by mouth daily. Reported on 08/27/2015     psyllium (METAMUCIL) 58.6 % powder Take 1 packet by mouth daily. Reported on 08/27/2015     rosuvastatin (CRESTOR) 5 MG tablet Take 5 mg by mouth every morning.      SYNTHROID 112 MCG tablet Take 112 mcg by mouth daily.     valsartan-hydrochlorothiazide (DIOVAN-HCT) 320-25 MG per tablet Take 1 tablet by mouth every morning.      No current facility-administered medications for this visit.    SURGICAL HISTORY:  Past Surgical History:  Procedure Laterality Date   BIOPSY  05/21/2018   Procedure: BIOPSY;  Surgeon: Graylin Shiver, MD;  [facility]: [facility];  Service: Endoscopy;;   BIOPSY  05/11/2021   Procedure:  BIOPSY;  Surgeon: Kathi Der, MD;  [facility];  Service: Gastroenterology;;   COLONOSCOPY WITH PROPOFOL N/A 03/19/2013   Procedure: COLONOSCOPY WITH PROPOFOL;  Surgeon: Charolett Bumpers, MD;  [facility]: [facility];  Service: Endoscopy;  Laterality: N/A;   COLONOSCOPY WITH PROPOFOL N/A 05/11/2021   Procedure: COLONOSCOPY WITH PROPOFOL;  Surgeon: Kathi Der, MD;  [facility]: [facility];  Service: Gastroenterology;  Laterality: N/A;   Cystourethroscopy, [facility] TURP.  04/16/2010   ESOPHAGOGASTRODUODENOSCOPY (EGD) WITH PROPOFOL N/A 05/21/2018   Procedure: ESOPHAGOGASTRODUODENOSCOPY (EGD) WITH PROPOFOL;  Surgeon: Graylin Shiver, MD;  [facility];  Service: Endoscopy;  Laterality: N/A;   NASAL SINUS SURGERY  1987   POLYPECTOMY  05/11/2021   Procedure: POLYPECTOMY;  Surgeon: Kathi Der, MD;  [facility]: [facility];  Service: Gastroenterology;;   Right video-assisted thoracoscopy, right upper lobectomy and  01/23/2006   The Olympus video bronchoscope was introduced via the right  12/21/2005   TONSILLECTOMY  1957   Torn medial and lateral menisci, left knee.  11/06/2006    REVIEW OF SYSTEMS:  A comprehensive review of systems was negative except for: Integument/breast: positive for rash   PHYSICAL EXAMINATION: General appearance: alert, cooperative, and no distress Head: Normocephalic, without obvious abnormality, atraumatic Neck: no adenopathy, no JVD, supple, symmetrical, trachea midline, and thyroid not enlarged, symmetric, no tenderness/mass/nodules Lymph nodes: Cervical, supraclavicular, and axillary nodes normal. Resp: clear to auscultation bilaterally Back: symmetric, no curvature. ROM normal. No CVA tenderness. Cardio: regular rate and rhythm, S1, S2 normal, no murmur, click, rub or gallop GI: soft, non-tender; bowel sounds normal; no masses,  no organomegaly Extremities: extremities normal, atraumatic, no cyanosis or edema  ECOG PERFORMANCE STATUS: 0  - Asymptomatic  Blood pressure (!) 155/71, pulse (!) 58, temperature 97.8 F (36.6 C), temperature source Oral, resp. rate 17, height 6' (1.829 m), weight 235 lb 11.2 oz (106.9 kg), SpO2 100%.  LABORATORY DATA: Lab Results  Component Value Date   WBC 5.4 10/27/2022   HGB 12.5 (L) 10/27/2022   HCT 35.9 (L) 10/27/2022   MCV 98.4 10/27/2022   PLT 222 10/27/2022      Chemistry      Component Value Date/Time   NA 136 08/22/2022 0846   NA 141 03/11/2019 0959   NA 139 04/06/2017 0932   K 3.6 08/22/2022 0846   K 3.8 04/06/2017 0932   CL 98 08/22/2022 0846   CL 102 04/24/2012 0853   CO2 33 (H) 08/22/2022 0846   CO2 29 04/06/2017 0932   BUN 19 08/22/2022 0846   BUN 18 03/11/2019 0959   BUN 14.4 04/06/2017 0932   CREATININE 0.99 08/22/2022 0846   CREATININE  1.0 04/06/2017 0932      Component Value Date/Time   CALCIUM 9.5 08/22/2022 0846   CALCIUM 9.5 04/06/2017 0932   ALKPHOS 84 08/22/2022 0846   ALKPHOS 58 04/06/2017 0932   AST 26 08/22/2022 0846   AST 27 04/06/2017 0932   ALT 22 08/22/2022 0846   ALT 27 04/06/2017 0932   BILITOT 0.7 08/22/2022 0846   BILITOT 0.45 04/06/2017 0932       RADIOGRAPHIC STUDIES: No results found.   ASSESSMENT AND PLAN:  This is a very pleasant 77 years old white male with recurrent non-small cell lung cancer, adenocarcinoma and positive EGFR mutation in exon 21. He is currently on Tarceva 100 mg by mouth daily status post around 99 months of treatment. The patient has been on treatment with Tarceva 100 mg p.o. daily and tolerating it fairly well. His lab work today including CBC showed mild anemia but no other abnormalities.  Comprehensive metabolic panel is still pending. I recommended for him to continue his current treatment with Tarceva at the same dose. I will see him back for follow-up visit in 2 months with repeat CT scan of the chest, abdomen and pelvis for restaging of his disease. For the skin rash she was advised to apply  hydrocortisone cream to the affected area. He was advised to call immediately if he has any other concerning symptoms in the interval.  The patient voices understanding of current disease status and treatment options and is in agreement with the current care plan. All questions were answered. The patient knows to call the clinic with any problems, questions or concerns. We can certainly see the patient much sooner if necessary. The total time spent in the appointment was 20 minutes.  Disclaimer: This note was dictated with voice recognition software. Similar sounding words can inadvertently be transcribed and may not be corrected upon review.

## 2022-11-02 ENCOUNTER — Telehealth: Payer: Self-pay | Admitting: *Deleted

## 2022-11-02 NOTE — Telephone Encounter (Signed)
Patient called - stated he saw Dr.Mohamed last week and he had a rash that he thought started after he increased lasix to 40 mg. He said Dr. Arbutus Ped recommended he discuss with prescriber. Patient said he spoke with doctor who prescribed it and was advised he can prescribe Torsemide 10 mg instead of Lasix.  Patient wants to know if Torsemide 10 mg will be ok to take with meds Dr. Arbutus Ped gives him - saying he never takes anything without checking with Dr. Arbutus Ped.  Dr. Arbutus Ped informed of patient question.  Contacted patient with Dr. Asa Lente response - Ok to take Torsemide 10 mg. Patient verbalized understanding and states he will let his prescribing provider know.

## 2022-11-16 ENCOUNTER — Other Ambulatory Visit (HOSPITAL_COMMUNITY): Payer: Self-pay

## 2022-11-18 ENCOUNTER — Other Ambulatory Visit (HOSPITAL_COMMUNITY): Payer: Self-pay

## 2022-11-25 ENCOUNTER — Other Ambulatory Visit (HOSPITAL_COMMUNITY): Payer: Self-pay

## 2022-12-15 DIAGNOSIS — Z23 Encounter for immunization: Secondary | ICD-10-CM | POA: Diagnosis not present

## 2022-12-19 ENCOUNTER — Other Ambulatory Visit (HOSPITAL_COMMUNITY): Payer: Self-pay

## 2022-12-19 ENCOUNTER — Inpatient Hospital Stay: Payer: Medicare Other | Attending: Internal Medicine

## 2022-12-19 DIAGNOSIS — R21 Rash and other nonspecific skin eruption: Secondary | ICD-10-CM | POA: Diagnosis not present

## 2022-12-19 DIAGNOSIS — Z79899 Other long term (current) drug therapy: Secondary | ICD-10-CM | POA: Diagnosis not present

## 2022-12-19 DIAGNOSIS — C3411 Malignant neoplasm of upper lobe, right bronchus or lung: Secondary | ICD-10-CM | POA: Diagnosis not present

## 2022-12-19 DIAGNOSIS — C3490 Malignant neoplasm of unspecified part of unspecified bronchus or lung: Secondary | ICD-10-CM

## 2022-12-19 DIAGNOSIS — I10 Essential (primary) hypertension: Secondary | ICD-10-CM | POA: Diagnosis not present

## 2022-12-19 DIAGNOSIS — Z902 Acquired absence of lung [part of]: Secondary | ICD-10-CM | POA: Diagnosis not present

## 2022-12-19 DIAGNOSIS — Z7982 Long term (current) use of aspirin: Secondary | ICD-10-CM | POA: Insufficient documentation

## 2022-12-19 LAB — CBC WITH DIFFERENTIAL (CANCER CENTER ONLY)
Abs Immature Granulocytes: 0.01 10*3/uL (ref 0.00–0.07)
Basophils Absolute: 0 10*3/uL (ref 0.0–0.1)
Basophils Relative: 1 %
Eosinophils Absolute: 0.3 10*3/uL (ref 0.0–0.5)
Eosinophils Relative: 5 %
HCT: 37 % — ABNORMAL LOW (ref 39.0–52.0)
Hemoglobin: 12.5 g/dL — ABNORMAL LOW (ref 13.0–17.0)
Immature Granulocytes: 0 %
Lymphocytes Relative: 46 %
Lymphs Abs: 2.4 10*3/uL (ref 0.7–4.0)
MCH: 34 pg (ref 26.0–34.0)
MCHC: 33.8 g/dL (ref 30.0–36.0)
MCV: 100.5 fL — ABNORMAL HIGH (ref 80.0–100.0)
Monocytes Absolute: 0.6 10*3/uL (ref 0.1–1.0)
Monocytes Relative: 12 %
Neutro Abs: 1.9 10*3/uL (ref 1.7–7.7)
Neutrophils Relative %: 36 %
Platelet Count: 171 10*3/uL (ref 150–400)
RBC: 3.68 MIL/uL — ABNORMAL LOW (ref 4.22–5.81)
RDW: 12.6 % (ref 11.5–15.5)
WBC Count: 5.2 10*3/uL (ref 4.0–10.5)
nRBC: 0 % (ref 0.0–0.2)

## 2022-12-19 LAB — CMP (CANCER CENTER ONLY)
ALT: 26 U/L (ref 0–44)
AST: 29 U/L (ref 15–41)
Albumin: 4.2 g/dL (ref 3.5–5.0)
Alkaline Phosphatase: 62 U/L (ref 38–126)
Anion gap: 3 — ABNORMAL LOW (ref 5–15)
BUN: 16 mg/dL (ref 8–23)
CO2: 32 mmol/L (ref 22–32)
Calcium: 9.8 mg/dL (ref 8.9–10.3)
Chloride: 102 mmol/L (ref 98–111)
Creatinine: 1.07 mg/dL (ref 0.61–1.24)
GFR, Estimated: >60 mL/min (ref >60)
Glucose, Bld: 101 mg/dL — ABNORMAL HIGH (ref 70–99)
Potassium: 4.3 mmol/L (ref 3.5–5.1)
Sodium: 137 mmol/L (ref 135–145)
Total Bilirubin: 0.7 mg/dL (ref 0.3–1.2)
Total Protein: 6.6 g/dL (ref 6.5–8.1)

## 2022-12-20 ENCOUNTER — Other Ambulatory Visit: Payer: Self-pay

## 2022-12-20 ENCOUNTER — Other Ambulatory Visit: Payer: Self-pay | Admitting: Internal Medicine

## 2022-12-20 ENCOUNTER — Encounter (HOSPITAL_COMMUNITY): Payer: Self-pay

## 2022-12-20 ENCOUNTER — Ambulatory Visit (HOSPITAL_COMMUNITY)
Admission: RE | Admit: 2022-12-20 | Discharge: 2022-12-20 | Disposition: A | Discharge status: Home / Self Care | Payer: Medicare Other | Source: Ambulatory Visit | Attending: Internal Medicine | Admitting: Internal Medicine

## 2022-12-20 DIAGNOSIS — K8020 Calculus of gallbladder without cholecystitis without obstruction: Secondary | ICD-10-CM | POA: Diagnosis not present

## 2022-12-20 DIAGNOSIS — C3490 Malignant neoplasm of unspecified part of unspecified bronchus or lung: Secondary | ICD-10-CM | POA: Diagnosis not present

## 2022-12-20 DIAGNOSIS — I700 Atherosclerosis of aorta: Secondary | ICD-10-CM | POA: Diagnosis not present

## 2022-12-20 DIAGNOSIS — C3431 Malignant neoplasm of lower lobe, right bronchus or lung: Secondary | ICD-10-CM

## 2022-12-20 DIAGNOSIS — K5730 Diverticulosis of large intestine without perforation or abscess without bleeding: Secondary | ICD-10-CM | POA: Diagnosis not present

## 2022-12-20 MED ORDER — IOHEXOL 300 MG/ML  SOLN
100.0000 mL | Freq: Once | INTRAMUSCULAR | Status: AC | PRN
  Administered 2022-12-20: 100 mL via INTRAVENOUS

## 2022-12-20 MED ORDER — SODIUM CHLORIDE (PF) 0.9 % IJ SOLN
INTRAMUSCULAR | Status: AC
  Filled 2022-12-20: qty 50

## 2022-12-20 MED ORDER — ERLOTINIB HCL 100 MG PO TABS
100.0000 mg | ORAL_TABLET | Freq: Every day | ORAL | 2 refills | Status: DC
Start: 2022-12-20 — End: 2022-12-27
  Filled 2022-12-20: qty 30, 30d supply, fill #0

## 2022-12-21 DIAGNOSIS — G43909 Migraine, unspecified, not intractable, without status migrainosus: Secondary | ICD-10-CM | POA: Diagnosis not present

## 2022-12-21 DIAGNOSIS — N400 Enlarged prostate without lower urinary tract symptoms: Secondary | ICD-10-CM | POA: Diagnosis not present

## 2022-12-21 DIAGNOSIS — R7303 Prediabetes: Secondary | ICD-10-CM | POA: Diagnosis not present

## 2022-12-21 DIAGNOSIS — C3490 Malignant neoplasm of unspecified part of unspecified bronchus or lung: Secondary | ICD-10-CM | POA: Diagnosis not present

## 2022-12-21 DIAGNOSIS — I700 Atherosclerosis of aorta: Secondary | ICD-10-CM | POA: Diagnosis not present

## 2022-12-21 DIAGNOSIS — E039 Hypothyroidism, unspecified: Secondary | ICD-10-CM | POA: Diagnosis not present

## 2022-12-21 DIAGNOSIS — J439 Emphysema, unspecified: Secondary | ICD-10-CM | POA: Diagnosis not present

## 2022-12-21 DIAGNOSIS — E559 Vitamin D deficiency, unspecified: Secondary | ICD-10-CM | POA: Diagnosis not present

## 2022-12-21 DIAGNOSIS — I10 Essential (primary) hypertension: Secondary | ICD-10-CM | POA: Diagnosis not present

## 2022-12-21 DIAGNOSIS — K219 Gastro-esophageal reflux disease without esophagitis: Secondary | ICD-10-CM | POA: Diagnosis not present

## 2022-12-21 DIAGNOSIS — G4700 Insomnia, unspecified: Secondary | ICD-10-CM | POA: Diagnosis not present

## 2022-12-21 DIAGNOSIS — Z0000 Encounter for general adult medical examination without abnormal findings: Secondary | ICD-10-CM | POA: Diagnosis not present

## 2022-12-26 ENCOUNTER — Encounter: Payer: Self-pay | Admitting: Surgery

## 2022-12-26 ENCOUNTER — Ambulatory Visit (INDEPENDENT_AMBULATORY_CARE_PROVIDER_SITE_OTHER): Payer: Medicare Other | Admitting: Surgery

## 2022-12-26 VITALS — BP 156/84 | HR 56 | Temp 98.1°F | Resp 18 | Ht 72.0 in | Wt 235.9 lb

## 2022-12-26 DIAGNOSIS — I83812 Varicose veins of left lower extremities with pain: Secondary | ICD-10-CM

## 2022-12-26 NOTE — Progress Notes (Signed)
Vascular and Vein Specialist of [place]  Patient name: [name] MRN: [id number] DOB: 11/17/1945 Sex: male   REASON FOR VISIT:    Follow up  HISOTRY OF PRESENT ILLNESS:    [name] is a 77 y.o. male who was seen by Dr. Lenell Antu 3 months ago for leg swelling.  The right leg is worse than the left.  This has been going on for several months.  He has also developed brawny discoloration of his pretibial skin on both sides.  He was placed in thigh-high compression stockings and encouraged to keep his legs elevated.  He did have a venous reflux study that was positive for saphenous vein reflux  A history of lung cancer since 2007.  He is getting ongoing evaluation by Dr. Arbutus Ped and Dr. Dorris Fetch.  He has undergone lobectomy in the past.  He is currently on Tarceva.   PAST MEDICAL HISTORY:   Past Medical History:  Diagnosis Date   Cholelithiases 07/16/2015   Complication of anesthesia 2007   quit breathing with bronchoscopy, had to spend night   History of agent Orange exposure 44-45 yrs ago   History of pneumonia  last 3-4 yrs ago   3 -4 different times   Hypercholesterolemia    Hypertension    Hypothyroidism    lung ca dx'd 2007   lung right   Sinus disease    hx of   Sleep apnea    uses cpap setting opf 12     FAMILY HISTORY:   Family History  Problem Relation Age of Onset   Hypertension Mother    Varicose Veins Mother    Heart attack Father    Heart attack Brother    Varicose Veins Sister     SOCIAL HISTORY:   Social History   Tobacco Use   Smoking status: Never   Smokeless tobacco: Never  Substance Use Topics   Alcohol use: No    Alcohol/week: 0.0 standard drinks of alcohol     ALLERGIES:   Allergies  Allergen Reactions   Lisinopril Cough   Prednisone Rash   Furosemide Rash     CURRENT MEDICATIONS:   Current Outpatient Medications  Medication Sig Dispense Refill   amitriptyline (ELAVIL) 50  MG tablet Take 50 mg by mouth at bedtime.     aspirin 81 MG tablet Take 81 mg by mouth every morning.      Cholecalciferol 2000 units CAPS Take 2,000 Units by mouth daily.      clindamycin (CLEOCIN T) 1 % external solution APPLY TO AFFECTED AREA(S) TWO TIMES A DAY AS NEEDED FOR SKIN IRRITATION 30 mL 0   cloNIDine (CATAPRES) 0.1 MG tablet Take 0.1 mg by mouth daily.     cloNIDine (CATAPRES) 0.2 MG tablet Take 0.2 mg by mouth daily.     erlotinib (TARCEVA) 100 MG tablet Take 1 tablet (100 mg total) by mouth daily. Take on an empty stomach 1 hour before meals or 2 hours after 30 tablet 2   furosemide (LASIX) 40 MG tablet Take 1 tablet (40 mg total) by mouth daily. 90 tablet 3   levETIRAcetam (KEPPRA) 750 MG tablet Take 750 mg by mouth at bedtime.      metoprolol succinate (TOPROL-XL) 50 MG 24 hr tablet Take 1 tablet (50 mg total) by mouth daily. Take with or immediately following a meal. 90 tablet 3   Multiple Vitamins-Minerals (CENTRUM SILVER PO) Take 1 tablet by mouth daily.      nitroGLYCERIN (NITROSTAT)  0.4 MG SL tablet DISSOLVE 1 TAB UNDER TONGUE FOR CHEST PAIN - IF PAIN REMAINS AFTER 5 MIN, CALL 911 AND REPEAT DOSE. MAX 3 TABS IN 15 MINUTES 25 tablet 10   polyethylene glycol (MIRALAX / GLYCOLAX) packet Take 17 g by mouth daily. Reported on 08/27/2015     psyllium (METAMUCIL) 58.6 % powder Take 1 packet by mouth daily. Reported on 08/27/2015     rosuvastatin (CRESTOR) 5 MG tablet Take 5 mg by mouth every morning.      SYNTHROID 112 MCG tablet Take 112 mcg by mouth daily.     valsartan-hydrochlorothiazide (DIOVAN-HCT) 320-25 MG per tablet Take 1 tablet by mouth every morning.      No current facility-administered medications for this visit.    REVIEW OF SYSTEMS:   [X]  denotes positive finding, [ ]  denotes negative finding Cardiac  Comments:  Chest pain or chest pressure:    Shortness of breath upon exertion:    Short of breath when lying flat:    Irregular heart rhythm:        Vascular     Pain in calf, thigh, or hip brought on by ambulation:    Pain in feet at night that wakes you up from your sleep:     Blood clot in your veins:    Leg swelling:  x       Pulmonary    Oxygen at home:    Productive cough:     Wheezing:         Neurologic    Sudden weakness in arms or legs:     Sudden numbness in arms or legs:     Sudden onset of difficulty speaking or slurred speech:    Temporary loss of vision in one eye:     Problems with dizziness:         Gastrointestinal    Blood in stool:     Vomited blood:         Genitourinary    Burning when urinating:     Blood in urine:        Psychiatric    Major depression:         Hematologic    Bleeding problems:    Problems with blood clotting too easily:        Skin    Rashes or ulcers:        Constitutional    Fever or chills:      PHYSICAL EXAM:   Vitals:   12/26/22 1129  BP: (!) 156/84  Pulse: (!) 56  Resp: 18  Temp: 98.1 F (36.7 C)  TempSrc: Temporal  SpO2: 96%  Weight: 235 lb 14.4 oz (107 kg)  Height: 6' (1.829 m)    GENERAL: The patient is a well-nourished male, in no acute distress. The vital signs are documented above. CARDIAC: There is a regular rate and rhythm.  VASCULAR: SonoSite was used to evaluate the saphenous vein.  The vein becomes extrafascial in the upper thigh.  I think there is a 15 cm segment to treat above his cluster of varicosities PULMONARY: Non-labored respirations ABDOMEN: Soft and non-tender with normal pitched bowel sounds.  MUSCULOSKELETAL: There are no major deformities or cyanosis. NEUROLOGIC: No focal weakness or paresthesias are detected. SKIN: There are no ulcers or rashes noted. PSYCHIATRIC: The patient has a normal affect.     STUDIES:   I have reviewed the following: Venous Reflux Times  +--------------+---------+------+-----------+------------+-------------+  RIGHT  Reflux NoRefluxReflux TimeDiameter cmsComments                                Yes                                        +--------------+---------+------+-----------+------------+-------------+  CFV          no                                                   +--------------+---------+------+-----------+------------+-------------+  FV mid        no                                                   +--------------+---------+------+-----------+------------+-------------+  Popliteal    no                                                   +--------------+---------+------+-----------+------------+-------------+  GSV at SFJ              yes    >500 ms      0.52                   +--------------+---------+------+-----------+------------+-------------+  GSV prox thigh          yes    >500 ms      0.55                   +--------------+---------+------+-----------+------------+-------------+  GSV mid thigh                                       out of fascia  +--------------+---------+------+-----------+------------+-------------+  SSV Pop Fossa no                            0.32                   +--------------+---------+------+-----------+------------+-------------+  SSV prox calf no                            0.20                   +--------------+---------+------+-----------+------------+-------------+   Summary:  Right:  - No evidence of deep vein thrombosis seen in the right lower extremity,  from the common femoral through the popliteal veins.  - No evidence of superficial venous thrombosis in the right lower  extremity.    - The deep venous system is competent.  - The great saphenous vein is incompetent at the [facility] and proximal thigh,  before it travels out of the fascia.  - The small saphenous vein is competent.   MEDICAL ISSUES:   CEAP class 4: The patient has had ongoing symptoms for many  years.  The swelling and pain bother him.  He has gotten good relief with compression socks.  He is  interested in pursuing laser ablation of the right saphenous vein with greater than 20 stabs.  He is scheduled to see Dr. Arbutus Ped as well as Dr. Dorris Fetch in the next few weeks regarding his lung cancer.  He plans to go forward with venous procedures in the near future.  We did discuss that this is elective and certainly does not have to be done as long as he wears his compression stockings.  His life expectancy with regards to his lung cancer appears to be very promising.    Charlena Cross, MD, FACS Vascular and Vein Specialists of [facility] [phone] Pager [phone]

## 2022-12-27 ENCOUNTER — Telehealth: Payer: Self-pay | Admitting: Pharmacy Technician

## 2022-12-27 ENCOUNTER — Inpatient Hospital Stay (HOSPITAL_BASED_OUTPATIENT_CLINIC_OR_DEPARTMENT_OTHER): Payer: Medicare Other | Admitting: Internal Medicine

## 2022-12-27 ENCOUNTER — Other Ambulatory Visit (HOSPITAL_COMMUNITY): Payer: Self-pay

## 2022-12-27 ENCOUNTER — Other Ambulatory Visit (HOSPITAL_COMMUNITY): Payer: Self-pay | Admitting: Pharmacy Technician

## 2022-12-27 ENCOUNTER — Other Ambulatory Visit: Payer: Self-pay

## 2022-12-27 ENCOUNTER — Telehealth: Payer: Self-pay | Admitting: Pharmacist

## 2022-12-27 DIAGNOSIS — R21 Rash and other nonspecific skin eruption: Secondary | ICD-10-CM | POA: Diagnosis not present

## 2022-12-27 DIAGNOSIS — C3431 Malignant neoplasm of lower lobe, right bronchus or lung: Secondary | ICD-10-CM

## 2022-12-27 DIAGNOSIS — Z7982 Long term (current) use of aspirin: Secondary | ICD-10-CM | POA: Diagnosis not present

## 2022-12-27 DIAGNOSIS — Z902 Acquired absence of lung [part of]: Secondary | ICD-10-CM | POA: Diagnosis not present

## 2022-12-27 DIAGNOSIS — C3411 Malignant neoplasm of upper lobe, right bronchus or lung: Secondary | ICD-10-CM | POA: Diagnosis not present

## 2022-12-27 DIAGNOSIS — L270 Generalized skin eruption due to drugs and medicaments taken internally: Secondary | ICD-10-CM | POA: Diagnosis not present

## 2022-12-27 DIAGNOSIS — Z79899 Other long term (current) drug therapy: Secondary | ICD-10-CM | POA: Diagnosis not present

## 2022-12-27 DIAGNOSIS — I10 Essential (primary) hypertension: Secondary | ICD-10-CM | POA: Diagnosis not present

## 2022-12-27 MED ORDER — CLINDAMYCIN PHOSPHATE 1 % EX LOTN
TOPICAL_LOTION | Freq: Two times a day (BID) | CUTANEOUS | 0 refills | Status: DC
  Filled 2022-12-27: qty 60, fill #0

## 2022-12-27 MED ORDER — CLINDAMYCIN PHOSPHATE 1 % EX SOLN
CUTANEOUS | 0 refills | Status: DC
Start: 2022-12-27 — End: 2023-08-01
  Filled 2022-12-27: qty 30, 15d supply, fill #0

## 2022-12-27 MED ORDER — OSIMERTINIB MESYLATE 80 MG PO TABS
80.0000 mg | ORAL_TABLET | Freq: Every day | ORAL | 3 refills | Status: DC
Start: 2022-12-27 — End: 2023-04-10
  Filled 2022-12-28: qty 30, 30d supply, fill #0
  Filled 2023-01-17: qty 30, 30d supply, fill #1
  Filled 2023-02-14: qty 30, 30d supply, fill #2
  Filled 2023-03-14: qty 30, 30d supply, fill #3

## 2022-12-27 NOTE — Telephone Encounter (Signed)
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  CMP from 12/19/22 assessed, no relevant lab abnormalities. Prescription dose and frequency assessed.   Current medication list in Epic reviewed, one relevant DDIs with osimertinib identified: Rosuvastatin: osimertinib may increase the serum concentration of Rosuvastatin. Monitor for increased rosuvastatin toxicities (eg, myalgias, rhabdomyolysis). Note, patient is already on a lower dose 5mg  of rosuvastatin.   Evaluated chart and no patient barriers to medication adherence identified.   Prescription has been e-scribed to the [facility] for benefits analysis and approval.  Oral Oncology Clinic will continue to follow for insurance authorization, copayment issues, initial counseling and start date.  Patient agreed to treatment on 12/27/22 per MD documentation.  Remi Haggard, PharmD, BCPS, BCOP, CPP Hematology/Oncology Clinical Pharmacist Practitioner [place] [phone]  12/27/2022 2:03 PM

## 2022-12-27 NOTE — Telephone Encounter (Signed)
Oral Oncology Patient Advocate Encounter  Prior Authorization for [name] has been approved.    PA# [id number] Effective dates: 12/27/22 through 04/04/23  Patients co-pay is $2,687.91.    Jinger Neighbors, CPhT-Adv Oncology Pharmacy Patient Advocate [facility] Direct Number: [phone]  Fax: [phone]

## 2022-12-27 NOTE — Progress Notes (Signed)
Provider D/c Tarceva paused patient enrollment

## 2022-12-27 NOTE — Telephone Encounter (Signed)
Oral Oncology Patient Advocate Encounter   Received notification that prior authorization for Tagrisso is required.   PA submitted on 12/27/22 Key [id number] Status is pending     Jinger Neighbors, CPhT-Adv Oncology Pharmacy Patient Advocate [facility] Direct Number: [phone]  Fax: [phone]

## 2022-12-27 NOTE — Progress Notes (Signed)
[facility] [phone]   [phone]  OFFICE PROGRESS NOTE  [name], MD 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  PRIOR THERAPY: 1) Status post right upper lobectomy under the care of Dr. Dorris Fetch on 01/23/2006.  2) Tarceva 150 mg by mouth daily as part of the BMS checkmate 370 clinical trial. Status post 6 weeks of treatment.  CURRENT THERAPY: Tarceva 100 mg by mouth daily as part of the BMS Checkmate 370 clinical trial. First dose started 07/17/2014 status post 101 months of treatment.  INTERVAL HISTORY: [name] 77 y.o. male returns to the clinic today for follow-up visit.  Discussed the use of AI scribe software for clinical note transcription with the patient, who gave verbal consent to proceed.  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 Over the past two months, he reported feeling "basically the same" with a slight increase in fatigue. Despite this, he remained active, walking and working in the yard without any limitations such as chest pain or significant breathing issues. He did note a slight shortness of breath but denied any cough, hemoptysis, nausea, vomiting, or diarrhea.  The patient reported a severe skin rash, which he attributed to a reaction to Furosemide (Lasix). Despite discontinuing the medication over a month ago, the rash persisted. He had been applying hydrocortisone cream, which provided some relief.  He also reported stable weight and blood pressure, although he had not taken his  medication on the day of the visit. He had seen another doctor the previous week, and his blood pressure was slightly high but within acceptable limits.  The patient has been on Tarceva for over eight years due to nodules in his lung. Recent scans suggested a slight increase in the size of some of these nodules, prompting consideration of a switch to Tagrisso.         MEDICAL HISTORY: Past Medical History:  Diagnosis Date   Cholelithiases 07/16/2015   Complication of anesthesia 2007   quit breathing with bronchoscopy, had to spend night   History of agent Orange exposure 44-45 yrs ago   History of pneumonia  last 3-4 yrs ago   3 -4 different times   Hypercholesterolemia    Hypertension    Hypothyroidism    lung ca dx'd 2007   lung right   Sinus disease    hx of   Sleep apnea    uses cpap setting opf 12    ALLERGIES:  is allergic to lisinopril, prednisone, and furosemide.  MEDICATIONS:  Current Outpatient Medications  Medication Sig Dispense Refill   amitriptyline (ELAVIL) 50 MG tablet Take 50 mg by mouth at bedtime.     aspirin 81 MG tablet Take 81 mg by mouth every morning.      Cholecalciferol 2000 units CAPS Take 2,000 Units by mouth daily.      clindamycin (CLEOCIN T) 1 % external solution APPLY TO AFFECTED AREA(S) TWO TIMES A DAY AS NEEDED FOR SKIN IRRITATION 30 mL 0   cloNIDine (CATAPRES) 0.1 MG tablet Take 0.1 mg by mouth daily.  cloNIDine (CATAPRES) 0.2 MG tablet Take 0.2 mg by mouth daily.     erlotinib (TARCEVA) 100 MG tablet Take 1 tablet (100 mg total) by mouth daily. Take on an empty stomach 1 hour before meals or 2 hours after 30 tablet 2   furosemide (LASIX) 40 MG tablet Take 1 tablet (40 mg total) by mouth daily. 90 tablet 3   levETIRAcetam (KEPPRA) 750 MG tablet Take 750 mg by mouth at bedtime.      metoprolol succinate (TOPROL-XL) 50 MG 24 hr tablet Take 1 tablet (50 mg total) by mouth daily. Take with or immediately following a meal. 90 tablet 3    Multiple Vitamins-Minerals (CENTRUM SILVER PO) Take 1 tablet by mouth daily.      nitroGLYCERIN (NITROSTAT) 0.4 MG SL tablet DISSOLVE 1 TAB UNDER TONGUE FOR CHEST PAIN - IF PAIN REMAINS AFTER 5 MIN, CALL 911 AND REPEAT DOSE. MAX 3 TABS IN 15 MINUTES 25 tablet 10   polyethylene glycol (MIRALAX / GLYCOLAX) packet Take 17 g by mouth daily. Reported on 08/27/2015     psyllium (METAMUCIL) 58.6 % powder Take 1 packet by mouth daily. Reported on 08/27/2015     rosuvastatin (CRESTOR) 5 MG tablet Take 5 mg by mouth every morning.      SYNTHROID 112 MCG tablet Take 112 mcg by mouth daily.     valsartan-hydrochlorothiazide (DIOVAN-HCT) 320-25 MG per tablet Take 1 tablet by mouth every morning.      No current facility-administered medications for this visit.    SURGICAL HISTORY:  Past Surgical History:  Procedure Laterality Date   BIOPSY  05/21/2018   Procedure: BIOPSY;  Surgeon: Graylin Shiver, MD;  Location: [facility];  Service: Endoscopy;;   BIOPSY  05/11/2021   Procedure: BIOPSY;  Surgeon: Kathi Der, MD;  Location: [facility];  Service: Gastroenterology;;   COLONOSCOPY WITH PROPOFOL N/A 03/19/2013   Procedure: COLONOSCOPY WITH PROPOFOL;  Surgeon: Charolett Bumpers, MD;  Location: [facility];  Service: Endoscopy;  Laterality: N/A;   COLONOSCOPY WITH PROPOFOL N/A 05/11/2021   Procedure: COLONOSCOPY WITH PROPOFOL;  Surgeon: Kathi Der, MD;  Location: [facility];  Service: Gastroenterology;  Laterality: N/A;   Cystourethroscopy, [facility] TURP.  04/16/2010   ESOPHAGOGASTRODUODENOSCOPY (EGD) WITH PROPOFOL N/A 05/21/2018   Procedure: ESOPHAGOGASTRODUODENOSCOPY (EGD) WITH PROPOFOL;  Surgeon: Graylin Shiver, MD;  Location: [facility];  Service: Endoscopy;  Laterality: N/A;   NASAL SINUS SURGERY  1987   POLYPECTOMY  05/11/2021   Procedure: POLYPECTOMY;  Surgeon: Kathi Der, MD;  Location: [facility];  Service: Gastroenterology;;   Right video-assisted thoracoscopy, right upper  lobectomy and  01/23/2006   The Olympus video bronchoscope was introduced via the right  12/21/2005   TONSILLECTOMY  1957   Torn medial and lateral menisci, left knee.  11/06/2006    REVIEW OF SYSTEMS:  Constitutional: positive for fatigue Eyes: negative Ears, nose, mouth, throat, and face: negative Respiratory: negative Cardiovascular: negative Gastrointestinal: negative Genitourinary:negative Integument/breast: positive for rash Hematologic/lymphatic: negative Musculoskeletal:negative Neurological: negative Behavioral/Psych: negative Endocrine: negative Allergic/Immunologic: negative   PHYSICAL EXAMINATION: General appearance: alert, cooperative, and no distress Head: Normocephalic, without obvious abnormality, atraumatic Neck: no adenopathy, no JVD, supple, symmetrical, trachea midline, and thyroid not enlarged, symmetric, no tenderness/mass/nodules Lymph nodes: Cervical, supraclavicular, and axillary nodes normal. Resp: clear to auscultation bilaterally Back: symmetric, no curvature. ROM normal. No CVA tenderness. Cardio: regular rate and rhythm, S1, S2 normal, no murmur, click, rub or gallop GI: soft, non-tender; bowel sounds normal; no masses,  no  organomegaly Extremities: extremities normal, atraumatic, no cyanosis or edema and papular skin rash on the upper extremities Neurologic: Alert and oriented X 3, normal strength and tone. Normal symmetric reflexes. Normal coordination and gait  ECOG PERFORMANCE STATUS: 0 - Asymptomatic  Blood pressure (!) 162/82, pulse 70, temperature 97.6 F (36.4 C), temperature source Oral, resp. rate 18, height 6' (1.829 m), weight 235 lb 4 oz (106.7 kg), SpO2 95%.  LABORATORY DATA: Lab Results  Component Value Date   WBC 5.2 12/19/2022   HGB 12.5 (L) 12/19/2022   HCT 37.0 (L) 12/19/2022   MCV 100.5 (H) 12/19/2022   PLT 171 12/19/2022      Chemistry      Component Value Date/Time   NA 137 12/19/2022 0853   NA 141 03/11/2019 0959    NA 139 04/06/2017 0932   K 4.3 12/19/2022 0853   K 3.8 04/06/2017 0932   CL 102 12/19/2022 0853   CL 102 04/24/2012 0853   CO2 32 12/19/2022 0853   CO2 29 04/06/2017 0932   BUN 16 12/19/2022 0853   BUN 18 03/11/2019 0959   BUN 14.4 04/06/2017 0932   CREATININE 1.07 12/19/2022 0853   CREATININE 1.0 04/06/2017 0932      Component Value Date/Time   CALCIUM 9.8 12/19/2022 0853   CALCIUM 9.5 04/06/2017 0932   ALKPHOS 62 12/19/2022 0853   ALKPHOS 58 04/06/2017 0932   AST 29 12/19/2022 0853   AST 27 04/06/2017 0932   ALT 26 12/19/2022 0853   ALT 27 04/06/2017 0932   BILITOT 0.7 12/19/2022 0853   BILITOT 0.45 04/06/2017 0932       RADIOGRAPHIC STUDIES: No results found.   ASSESSMENT AND PLAN:  This is a very pleasant 77 years old white male with recurrent non-small cell lung cancer, adenocarcinoma and positive EGFR mutation in exon 21. He is currently on Tarceva 100 mg by mouth daily status post around 101 months of treatment. The patient has been on treatment with Tarceva 100 mg p.o. daily and tolerating it fairly well.    Recurrent Non-small cell lung cancer with EGFR mutation Exon 21 (L858R) Noted slight increase in size of some lung nodules on scan from 12/20/2022 compared to previous scan in May. Discussed potential need to switch from Tarceva to Tagrisso (Osimertinib) due to progressive disease. -Pending final scan report, consider switching to Tagrisso. -Order EKG prior to starting Tagrisso. -Plan to see patient back in 3 weeks after starting Tagrisso to monitor response and side effects.  Skin Rash Persistent rash likely secondary to Lasix. Patient has been applying hydrocortisone cream with some improvement. -Continue hydrocortisone cream. -Refill Clindamycin lotion.  Hypertension Blood pressure slightly elevated today, patient did not take medication this morning. Patient reports blood pressure is usually close to normal at home. -Continue current antihypertensive  regimen.  General Health Maintenance -Advise patient to receive COVID-19 booster and RSV vaccines.   The patient was advised to call immediately if he has any concerning symptoms in the interval.  The patient voices understanding of current disease status and treatment options and is in agreement with the current care plan. All questions were answered. The patient knows to call the clinic with any problems, questions or concerns. We can certainly see the patient much sooner if necessary. The total time spent in the appointment was 30 minutes.  Disclaimer: This note was dictated with voice recognition software. Similar sounding words can inadvertently be transcribed and may not be corrected upon review.

## 2022-12-28 ENCOUNTER — Other Ambulatory Visit: Payer: Self-pay | Admitting: Pharmacy Technician

## 2022-12-28 ENCOUNTER — Other Ambulatory Visit: Payer: Self-pay

## 2022-12-28 ENCOUNTER — Other Ambulatory Visit (HOSPITAL_COMMUNITY): Payer: Self-pay

## 2022-12-28 NOTE — Progress Notes (Signed)
Patient education documented on 12/28/22 in EPIC Notes

## 2022-12-28 NOTE — Telephone Encounter (Signed)
Clinical Pharmacist Practitioner Encounter   Patient plans on picking up his osimertinib from [facility] today and start taking tomorrow 12/29/22.  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  Counseled patient on administration, dosing, side effects, monitoring, drug-food interactions, safe handling, storage, and disposal. Patient will take 1 tablet (80 mg total) by mouth daily.   Side effects include but not limited to: rash, mouth sore, nausea, diarrhea, decrease wbc/hgb/plt.   Diarrhea: patient knows to use loperamide as needed and call the office if hs is having four or more loose stools per day Rash: instructed patient to call the office if he notices a rash  Reviewed with patient importance of keeping a medication schedule and plan for any missed doses.  After discussion with patient no patient barriers to medication adherence identified.   Mr. Nilsson voiced understanding and appreciation. All questions answered. Medication handout provided.  Provided patient with Oral Chemotherapy Navigation Clinic phone number. Patient knows to call the office with questions or concerns. Oral Chemotherapy Navigation Clinic will continue to follow.  Remi Haggard, PharmD, BCPS, BCOP, CPP Hematology/Oncology Clinical Pharmacist Practitioner [place] [phone]  12/28/2022 10:02 AM

## 2022-12-28 NOTE — Progress Notes (Signed)
Specialty Pharmacy Initial Fill Coordination Note  [name] is a 77 y.o. male contacted today regarding refills of specialty medication(s) Osimertinib Mesylate .  Patient requested [name] at [facility] at [place]  on 12/28/22   Medication will be filled on 12/28/22.   Patient is aware of $0 copayment.  Copayment is after applying PANF grant.

## 2022-12-29 ENCOUNTER — Other Ambulatory Visit: Payer: Self-pay | Admitting: *Deleted

## 2022-12-29 ENCOUNTER — Ambulatory Visit: Payer: Medicare Other | Admitting: Cardiology

## 2022-12-29 DIAGNOSIS — I83811 Varicose veins of right lower extremities with pain: Secondary | ICD-10-CM

## 2022-12-29 DIAGNOSIS — Z23 Encounter for immunization: Secondary | ICD-10-CM | POA: Diagnosis not present

## 2022-12-30 DIAGNOSIS — C44722 Squamous cell carcinoma of skin of right lower limb, including hip: Secondary | ICD-10-CM | POA: Diagnosis not present

## 2022-12-30 DIAGNOSIS — L308 Other specified dermatitis: Secondary | ICD-10-CM | POA: Diagnosis not present

## 2023-01-03 ENCOUNTER — Ambulatory Visit (INDEPENDENT_AMBULATORY_CARE_PROVIDER_SITE_OTHER): Payer: Medicare Other | Admitting: Thoracic Surgery (Cardiothoracic Vascular Surgery)

## 2023-01-03 VITALS — BP 179/91 | HR 59 | Resp 18 | Ht 72.0 in | Wt 237.0 lb

## 2023-01-03 DIAGNOSIS — C3431 Malignant neoplasm of lower lobe, right bronchus or lung: Secondary | ICD-10-CM

## 2023-01-03 DIAGNOSIS — Z902 Acquired absence of lung [part of]: Secondary | ICD-10-CM | POA: Diagnosis not present

## 2023-01-03 NOTE — Progress Notes (Signed)
[address]       [name] [address]             [phone]     HPI: [name] returns for follow-up of his lung cancer.  [name] is a 77 year old male with a history of a right upper lobectomy for adenocarcinoma of the lung in 2007.  He developed a recurrence 10 years later.  Biopsy showed adenocarcinoma and molecular testing was positive for EGFR.  Followed by Dr. Shirline Frees.  He has been on Tarceva.  Recent CT showed increase in the size of some lung nodules and he was switched to Tagrisso couple of days ago.  He developed a rash after starting Lasix.  He was treated with hydrocortisone cream.  He says the rash is improving.  He feels well.  No significant change in appetite or weight loss.  Has been having issues with varicose veins and swelling in his legs.  He is scheduled to have a vein procedure in the near future.   Past Medical History:  Diagnosis Date   Cholelithiases 07/16/2015   Complication of anesthesia 2007   quit breathing with bronchoscopy, had to spend night   History of agent Orange exposure 44-45 yrs ago   History of pneumonia  last 3-4 yrs ago   3 -4 different times   Hypercholesterolemia    Hypertension    Hypothyroidism    lung ca dx'd 2007   lung right   Sinus disease    hx of   Sleep apnea    uses cpap setting opf 12    Current Outpatient Medications  Medication Sig Dispense Refill   amitriptyline (ELAVIL) 50 MG tablet Take 50 mg by mouth at bedtime.     aspirin 81 MG tablet Take 81 mg by mouth every morning.      Cholecalciferol 2000 units CAPS Take 2,000 Units by mouth daily.      clindamycin (CLEOCIN T) 1 % external solution APPLY TO AFFECTED AREA(S) TWO TIMES A DAY AS NEEDED FOR SKIN IRRITATION 30 mL 0   cloNIDine (CATAPRES) 0.1 MG tablet Take 0.1 mg by mouth daily.     cloNIDine (CATAPRES) 0.2 MG tablet Take 0.2 mg by mouth daily.     levETIRAcetam (KEPPRA) 750 MG tablet Take 750 mg by mouth at bedtime.       metoprolol succinate (TOPROL-XL) 50 MG 24 hr tablet Take 1 tablet (50 mg total) by mouth daily. Take with or immediately following a meal. 90 tablet 3   Multiple Vitamins-Minerals (CENTRUM SILVER PO) Take 1 tablet by mouth daily.      nitroGLYCERIN (NITROSTAT) 0.4 MG SL tablet DISSOLVE 1 TAB UNDER TONGUE FOR CHEST PAIN - IF PAIN REMAINS AFTER 5 MIN, CALL 911 AND REPEAT DOSE. MAX 3 TABS IN 15 MINUTES 25 tablet 10   osimertinib mesylate (TAGRISSO) 80 MG tablet Take 1 tablet (80 mg total) by mouth daily. 30 tablet 3   polyethylene glycol (MIRALAX / GLYCOLAX) packet Take 17 g by mouth daily. Reported on 08/27/2015     psyllium (METAMUCIL) 58.6 % powder Take 1 packet by mouth daily. Reported on 08/27/2015     rosuvastatin (CRESTOR) 5 MG tablet Take 5 mg by mouth every morning.      SYNTHROID 112 MCG tablet Take 112 mcg by mouth daily.     valsartan-hydrochlorothiazide (DIOVAN-HCT) 320-25 MG per tablet Take 1 tablet by mouth every morning.      No current facility-administered  medications for this visit.    Physical Exam BP (!) 179/91 (BP Location: Left Arm, Patient Position: Sitting)   Pulse (!) 59   Resp 18   Ht 6' (1.829 m)   Wt 237 lb (107.5 kg)   SpO2 98% Comment: RA  BMI 32.14 kg/m  Well-appearing 77 year old man in no acute distress Alert and oriented x 3 with no focal deficits Absent breath sounds right base, otherwise clear Cardiac regular rate and rhythm Peripheral edema with compression hose in place  Diagnostic Tests: CT CHEST, ABDOMEN, AND PELVIS WITH CONTRAST   TECHNIQUE: Multidetector CT imaging of the chest, abdomen and pelvis was performed following the standard protocol during bolus administration of intravenous contrast.   RADIATION DOSE REDUCTION: This exam was performed according to the departmental dose-optimization program which includes automated exposure control, adjustment of the mA and/or kV according to patient size and/or use of iterative reconstruction  technique.   CONTRAST:  OMNIPAQUE IOHEXOL 300 MG/ML  SOLN   COMPARISON:  08/23/2022   FINDINGS: CT CHEST FINDINGS   Cardiovascular: Heart is normal in size.  No pericardial effusion.   No evidence thoracic aortic aneurysm. Mild atherosclerotic calcifications of the aortic arch.   Mediastinum/Nodes: Small mediastinal nodes, including an 8 mm short axis subcarinal node (series 2/image 30), within normal limits.   Visualized thyroid is unremarkable.   Lungs/Pleura: Status post right upper lobectomy.   Bandlike nodularity in the anterior/superior right lower lobe, including:   --2.5 x 1.0 cm nodular opacity anteriorly (series 6/image 46), previously 2.2 x 1.1 cm   --2.8 x 2.4 cm nodule in the medial right lower lobe (series 6/image 2), previously 2.4 x 2.2 cm   --4.3 x 1.4 cm nodule anteriorly in the lower right lower lobe (series 6/image 46), previously 4.1 x 1.6 cm   Overall, mild progression is favored.   No focal consolidation.   No pleural effusion or pneumothorax.   Musculoskeletal: Visualized osseous structures are within normal limits.   CT ABDOMEN PELVIS FINDINGS   Hepatobiliary: Liver is within normal limits.   Cholelithiasis, without associated inflammatory changes. No intrahepatic or extrahepatic duct dilatation.   Pancreas: Within normal limits.   Spleen: Within normal limits.   Adrenals/Urinary Tract: Adrenal glands are within normal limits.   Kidneys are within normal limits.  No hydronephrosis.   Bladder is within normal limits.   Stomach/Bowel: Stomach is within normal limits.   No evidence of bowel obstruction.   Normal appendix (series 2/image 84).   Mild left colonic diverticulosis, without evidence of diverticulitis.   Vascular/Lymphatic: No evidence of abdominal aortic aneurysm.   The aorta enhanced   Not lymph nodes   Reproductive: Prostate is unremarkable.   Other: No abdominopelvic ascites.   Tiny fat containing  periumbilical hernia (series 2/image 97). Mild fat in the left inguinal canal.   Musculoskeletal: Degenerative changes of the lumbar spine.   IMPRESSION: Status post right upper lobectomy.   Progressive right lower lobe nodularity, as described above, compatible with metastatic disease.   No evidence of metastatic disease in the abdomen/pelvis.   Cholelithiasis, without associated inflammatory changes.     Electronically Signed   By: Charline Bills M.D.   On: 01/02/2023 22:28 I personally reviewed the CT images.  There has been interval growth of the right lower lobe nodules in comparison to his film from January 2024.  No evidence of new disease elsewhere.  Large gallstone.  Impression: [name] is a 77 year old male with a history  of a right upper lobectomy for adenocarcinoma of the lung in 2007.  He developed a recurrence 10 years later.  Biopsy showed adenocarcinoma and molecular testing was positive for EGFR.  Adenocarcinoma of the lung-EGFR positive.  Multiple sites.  Has been on Tarceva for a long time.  Recently switched to Tagrisso due to progression.  Plan: Follow-up as scheduled with Dr. Arbutus Ped I will see him back in about 6 months to see how he is doing.  I spent over 15 minutes in review of records, images, and in consultation with [name] today. Loreli Slot, MD [facility] [phone]

## 2023-01-12 NOTE — Progress Notes (Signed)
[facility] OFFICE PROGRESS NOTE  [name], MD 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 Dorris Fetch on 01/23/2006.  2) Tarceva 150 mg by mouth daily as part of the BMS checkmate 370 clinical trial. Status post 6 weeks of treatment. Tarceva 100 mg by mouth daily as part of the BMS Checkmate 370 clinical trial. First dose started 07/17/2014. Discontinued due to disease progression in September 2024  CURRENT THERAPY: Tagrisso 80 mg p.o. daily. First dose on 12/28/22  INTERVAL HISTORY: [name] 77 y.o. male returns to the clinic today for a follow-up visit.  The patient was last seen by Dr. Arbutus Ped on 12/27/2022.  At that point in time, a restaging CT scan that showed some progressive right lower lobe nodularity compatible with metastatic disease.  Therefore, his treatment was changed from Tarceva to Tagrisso.  He is first dose was on 12/28/2022 and so far he has been tolerating it fine.   He denies changes in her energy. She tries to stay active. He denies any fever, chills, night sweats, or unexplained weight loss.  Denies any chest pain, cough, or hemoptysis. He has some mild dyspnea on exertion, most noticeable with the stairs. He denies cough. He denies chest pain or hemoptysis. He denies any nausea, vomiting, diarrhea, or constipation.  Denies any headache or visual changes. He denies rashes or skin changes.  He is here today for evaluation and repeat blood work.   MEDICAL HISTORY: Past Medical History:  Diagnosis Date   Cholelithiases 07/16/2015   Complication of anesthesia 2007   quit breathing with bronchoscopy, had to spend night   History of agent Orange exposure 44-45 yrs ago   History of pneumonia   last 3-4 yrs ago   3 -4 different times   Hypercholesterolemia    Hypertension    Hypothyroidism    lung ca dx'd 2007   lung right   Sinus disease    hx of   Sleep apnea    uses cpap setting opf 12    ALLERGIES:  is allergic to lisinopril, prednisone, and furosemide.  MEDICATIONS:  Current Outpatient Medications  Medication Sig Dispense Refill   amitriptyline (ELAVIL) 50 MG tablet Take 50 mg by mouth at bedtime.     aspirin 81 MG tablet Take 81 mg by mouth every morning.      Cholecalciferol 2000 units CAPS Take 2,000 Units by mouth daily.      clindamycin (CLEOCIN T) 1 % external solution APPLY TO AFFECTED AREA(S) TWO TIMES A DAY AS NEEDED FOR SKIN IRRITATION 30 mL 0   cloNIDine (CATAPRES) 0.1 MG tablet Take 0.1 mg by mouth daily.     cloNIDine (CATAPRES) 0.2 MG tablet Take 0.2 mg by mouth daily.     levETIRAcetam (KEPPRA) 750 MG tablet Take 750 mg by mouth at bedtime.      metoprolol succinate (TOPROL-XL) 50 MG 24 hr tablet Take 1 tablet (50 mg total) by mouth daily. Take with or immediately following a meal. 90 tablet 3   Multiple Vitamins-Minerals (CENTRUM SILVER PO) Take 1 tablet by mouth daily.      nitroGLYCERIN (NITROSTAT) 0.4 MG SL tablet DISSOLVE 1 TAB UNDER TONGUE FOR CHEST PAIN - IF PAIN REMAINS AFTER 5  MIN, CALL 911 AND REPEAT DOSE. MAX 3 TABS IN 15 MINUTES 25 tablet 10   osimertinib mesylate (TAGRISSO) 80 MG tablet Take 1 tablet (80 mg total) by mouth daily. 30 tablet 3   polyethylene glycol (MIRALAX / GLYCOLAX) packet Take 17 g by mouth daily. Reported on 08/27/2015     psyllium (METAMUCIL) 58.6 % powder Take 1 packet by mouth daily. Reported on 08/27/2015     rosuvastatin (CRESTOR) 5 MG tablet Take 5 mg by mouth every morning.      SYNTHROID 112 MCG tablet Take 112 mcg by mouth daily.     valsartan-hydrochlorothiazide (DIOVAN-HCT) 320-25 MG per tablet Take 1 tablet by mouth every morning.      No current facility-administered medications for this visit.    SURGICAL  HISTORY:  Past Surgical History:  Procedure Laterality Date   BIOPSY  05/21/2018   Procedure: BIOPSY;  Surgeon: Graylin Shiver, MD;  Location: [facility];  Service: Endoscopy;;   BIOPSY  05/11/2021   Procedure: BIOPSY;  Surgeon: Kathi Der, MD;  Location: [facility];  Service: Gastroenterology;;   COLONOSCOPY WITH PROPOFOL N/A 03/19/2013   Procedure: COLONOSCOPY WITH PROPOFOL;  Surgeon: Charolett Bumpers, MD;  Location: [facility];  Service: Endoscopy;  Laterality: N/A;   COLONOSCOPY WITH PROPOFOL N/A 05/11/2021   Procedure: COLONOSCOPY WITH PROPOFOL;  Surgeon: Kathi Der, MD;  Location: [facility];  Service: Gastroenterology;  Laterality: N/A;   Cystourethroscopy, [facility] TURP.  04/16/2010   ESOPHAGOGASTRODUODENOSCOPY (EGD) WITH PROPOFOL N/A 05/21/2018   Procedure: ESOPHAGOGASTRODUODENOSCOPY (EGD) WITH PROPOFOL;  Surgeon: Graylin Shiver, MD;  Location: [facility];  Service: Endoscopy;  Laterality: N/A;   NASAL SINUS SURGERY  1987   POLYPECTOMY  05/11/2021   Procedure: POLYPECTOMY;  Surgeon: Kathi Der, MD;  Location: [facility];  Service: Gastroenterology;;   Right video-assisted thoracoscopy, right upper lobectomy and  01/23/2006   The Olympus video bronchoscope was introduced via the right  12/21/2005   TONSILLECTOMY  1957   Torn medial and lateral menisci, left knee.  11/06/2006    REVIEW OF SYSTEMS:   Review of Systems  Constitutional: Negative for appetite change, chills, fatigue, fever and unexpected weight change.  HENT: Negative for mouth sores, nosebleeds, sore throat and trouble swallowing.   Eyes: Negative for eye problems and icterus.  Respiratory: Mild occasional dyspnea on exertion. Negative for cough, hemoptysis, and wheezing.   Cardiovascular: Negative for chest pain. Positive for lower extremity swelling.  Gastrointestinal: Negative for abdominal pain, constipation, diarrhea, nausea and vomiting.  Genitourinary: Negative for bladder incontinence,  difficulty urinating, dysuria, frequency and hematuria.   Musculoskeletal: Negative for back pain, gait problem, neck pain and neck stiffness.  Skin: Negative for itching and rash.  Neurological: Negative for dizziness, extremity weakness, gait problem, headaches, light-headedness and seizures.  Hematological: Negative for adenopathy. Does not bruise/bleed easily.  Psychiatric/Behavioral: Negative for confusion, depression and sleep disturbance. The patient is not nervous/anxious.     PHYSICAL EXAMINATION:  Blood pressure (!) 177/89, pulse 65, temperature (!) 97.5 F (36.4 C), temperature source Oral, resp. rate 17, height 6' (1.829 m), weight 238 lb 1 oz (108 kg), SpO2 95%.  ECOG PERFORMANCE STATUS: 1  Physical Exam  Constitutional: Oriented to person, place, and time and well-developed, well-nourished, and in no distress.  HENT:  Head: Normocephalic and atraumatic.  Mouth/Throat: Oropharynx is clear and moist. No oropharyngeal exudate.  Eyes: Conjunctivae are normal. Right eye exhibits no discharge. Left eye exhibits no discharge. No scleral icterus.  Neck: Normal  range of motion. Neck supple.  Cardiovascular: Normal rate, regular rhythm, normal heart sounds and intact distal pulses.   Pulmonary/Chest: Effort normal and breath sounds normal. No respiratory distress. No wheezes. No rales.  Abdominal: Soft. Bowel sounds are normal. Exhibits no distension and no mass. There is no tenderness.  Musculoskeletal: Normal range of motion. Positive for stable bilateral edema.   Lymphadenopathy:    No cervical adenopathy.  Neurological: Alert and oriented to person, place, and time. Exhibits normal muscle tone. Gait normal. Coordination normal.  Skin: Skin is warm and dry. No rash noted. Not diaphoretic. No erythema. No pallor.  Psychiatric: Mood, memory and judgment normal.  Vitals reviewed.  LABORATORY DATA: Lab Results  Component Value Date   WBC 5.2 12/19/2022   HGB 12.5 (L) 12/19/2022    HCT 37.0 (L) 12/19/2022   MCV 100.5 (H) 12/19/2022   PLT 171 12/19/2022      Chemistry      Component Value Date/Time   NA 137 12/19/2022 0853   NA 141 03/11/2019 0959   NA 139 04/06/2017 0932   K 4.3 12/19/2022 0853   K 3.8 04/06/2017 0932   CL 102 12/19/2022 0853   CL 102 04/24/2012 0853   CO2 32 12/19/2022 0853   CO2 29 04/06/2017 0932   BUN 16 12/19/2022 0853   BUN 18 03/11/2019 0959   BUN 14.4 04/06/2017 0932   CREATININE 1.07 12/19/2022 0853   CREATININE 1.0 04/06/2017 0932      Component Value Date/Time   CALCIUM 9.8 12/19/2022 0853   CALCIUM 9.5 04/06/2017 0932   ALKPHOS 62 12/19/2022 0853   ALKPHOS 58 04/06/2017 0932   AST 29 12/19/2022 0853   AST 27 04/06/2017 0932   ALT 26 12/19/2022 0853   ALT 27 04/06/2017 0932   BILITOT 0.7 12/19/2022 0853   BILITOT 0.45 04/06/2017 0932       RADIOGRAPHIC STUDIES:  CT ABDOMEN PELVIS W CONTRAST  Result Date: 01/02/2023 CLINICAL DATA:  Non-small cell lung cancer, status post right upper lobectomy, Tarceva in progress EXAM: CT CHEST, ABDOMEN, AND PELVIS WITH CONTRAST TECHNIQUE: Multidetector CT imaging of the chest, abdomen and pelvis was performed following the standard protocol during bolus administration of intravenous contrast. RADIATION DOSE REDUCTION: This exam was performed according to the departmental dose-optimization program which includes automated exposure control, adjustment of the mA and/or kV according to patient size and/or use of iterative reconstruction technique. CONTRAST:  OMNIPAQUE IOHEXOL 300 MG/ML  SOLN COMPARISON:  08/23/2022 FINDINGS: CT CHEST FINDINGS Cardiovascular: Heart is normal in size.  No pericardial effusion. No evidence thoracic aortic aneurysm. Mild atherosclerotic calcifications of the aortic arch. Mediastinum/Nodes: Small mediastinal nodes, including an 8 mm short axis subcarinal node (series 2/image 30), within normal limits. Visualized thyroid is unremarkable. Lungs/Pleura: Status  post right upper lobectomy. Bandlike nodularity in the anterior/superior right lower lobe, including: --2.5 x 1.0 cm nodular opacity anteriorly (series 6/image 46), previously 2.2 x 1.1 cm --2.8 x 2.4 cm nodule in the medial right lower lobe (series 6/image 2), previously 2.4 x 2.2 cm --4.3 x 1.4 cm nodule anteriorly in the lower right lower lobe (series 6/image 46), previously 4.1 x 1.6 cm Overall, mild progression is favored. No focal consolidation. No pleural effusion or pneumothorax. Musculoskeletal: Visualized osseous structures are within normal limits. CT ABDOMEN PELVIS FINDINGS Hepatobiliary: Liver is within normal limits. Cholelithiasis, without associated inflammatory changes. No intrahepatic or extrahepatic duct dilatation. Pancreas: Within normal limits. Spleen: Within normal limits. Adrenals/Urinary Tract: Adrenal glands  are within normal limits. Kidneys are within normal limits.  No hydronephrosis. Bladder is within normal limits. Stomach/Bowel: Stomach is within normal limits. No evidence of bowel obstruction. Normal appendix (series 2/image 84). Mild left colonic diverticulosis, without evidence of diverticulitis. Vascular/Lymphatic: No evidence of abdominal aortic aneurysm. The aorta enhanced Not lymph nodes Reproductive: Prostate is unremarkable. Other: No abdominopelvic ascites. Tiny fat containing periumbilical hernia (series 2/image 97). Mild fat in the left inguinal canal. Musculoskeletal: Degenerative changes of the lumbar spine. IMPRESSION: Status post right upper lobectomy. Progressive right lower lobe nodularity, as described above, compatible with metastatic disease. No evidence of metastatic disease in the abdomen/pelvis. Cholelithiasis, without associated inflammatory changes. Electronically Signed   By: Charline Bills M.D.   On: 01/02/2023 22:28   CT CHEST W CONTRAST  Result Date: 01/02/2023 CLINICAL DATA:  Non-small cell lung cancer, status post right upper lobectomy, Tarceva in  progress EXAM: CT CHEST, ABDOMEN, AND PELVIS WITH CONTRAST TECHNIQUE: Multidetector CT imaging of the chest, abdomen and pelvis was performed following the standard protocol during bolus administration of intravenous contrast. RADIATION DOSE REDUCTION: This exam was performed according to the departmental dose-optimization program which includes automated exposure control, adjustment of the mA and/or kV according to patient size and/or use of iterative reconstruction technique. CONTRAST:  OMNIPAQUE IOHEXOL 300 MG/ML  SOLN COMPARISON:  08/23/2022 FINDINGS: CT CHEST FINDINGS Cardiovascular: Heart is normal in size.  No pericardial effusion. No evidence thoracic aortic aneurysm. Mild atherosclerotic calcifications of the aortic arch. Mediastinum/Nodes: Small mediastinal nodes, including an 8 mm short axis subcarinal node (series 2/image 30), within normal limits. Visualized thyroid is unremarkable. Lungs/Pleura: Status post right upper lobectomy. Bandlike nodularity in the anterior/superior right lower lobe, including: --2.5 x 1.0 cm nodular opacity anteriorly (series 6/image 46), previously 2.2 x 1.1 cm --2.8 x 2.4 cm nodule in the medial right lower lobe (series 6/image 2), previously 2.4 x 2.2 cm --4.3 x 1.4 cm nodule anteriorly in the lower right lower lobe (series 6/image 46), previously 4.1 x 1.6 cm Overall, mild progression is favored. No focal consolidation. No pleural effusion or pneumothorax. Musculoskeletal: Visualized osseous structures are within normal limits. CT ABDOMEN PELVIS FINDINGS Hepatobiliary: Liver is within normal limits. Cholelithiasis, without associated inflammatory changes. No intrahepatic or extrahepatic duct dilatation. Pancreas: Within normal limits. Spleen: Within normal limits. Adrenals/Urinary Tract: Adrenal glands are within normal limits. Kidneys are within normal limits.  No hydronephrosis. Bladder is within normal limits. Stomach/Bowel: Stomach is within normal limits. No  evidence of bowel obstruction. Normal appendix (series 2/image 84). Mild left colonic diverticulosis, without evidence of diverticulitis. Vascular/Lymphatic: No evidence of abdominal aortic aneurysm. The aorta enhanced Not lymph nodes Reproductive: Prostate is unremarkable. Other: No abdominopelvic ascites. Tiny fat containing periumbilical hernia (series 2/image 97). Mild fat in the left inguinal canal. Musculoskeletal: Degenerative changes of the lumbar spine. IMPRESSION: Status post right upper lobectomy. Progressive right lower lobe nodularity, as described above, compatible with metastatic disease. No evidence of metastatic disease in the abdomen/pelvis. Cholelithiasis, without associated inflammatory changes. Electronically Signed   By: Charline Bills M.D.   On: 01/02/2023 22:28     ASSESSMENT/PLAN:  This is a very pleasant 77 year old Caucasian male with recurrent non-small cell lung cancer, adenocarcinoma.  He is positive for EGFR mutation and 21.  He was on Tarceva 100 mg by mouth daily status post 101 months of treatment.  At his last appointment in September 2024, the patient had restaging CT scan at that time that showed some progressive disease  with progressive right lower lobe nodularity .   Therefore, his treatment was changed to Tagrisso 80 mg p.o. daily.  He started this on 12/28/2022 and thus far has been tolerating it well.   Labs were reviewed.  Recommend that he continue with Tagrisso at the same dose.  We will see him back for follow-up visit in 2-3 weeks for evaluation repeat blood work.  He is scheduled for vein ablation on 10/24. I will check with Dr. Arbutus Ped to ensure there is no adjustments needed to his Tagrisso for his upcoming procedure. It is likely alright to continue but I will verify with Dr. Arbutus Ped.   The patient was advised to call immediately if she has any concerning symptoms in the interval. The patient voices understanding of current disease status and  treatment options and is in agreement with the current care plan. All questions were answered. The patient knows to call the clinic with any problems, questions or concerns. We can certainly see the patient much sooner if necessary  No orders of the defined types were placed in this encounter.    The total time spent in the appointment was 20-29 minutes  Artesha Wemhoff L Taylar Hartsough, PA-C 01/17/23

## 2023-01-16 ENCOUNTER — Other Ambulatory Visit: Payer: Self-pay

## 2023-01-17 ENCOUNTER — Other Ambulatory Visit: Payer: Self-pay

## 2023-01-17 ENCOUNTER — Other Ambulatory Visit: Payer: Self-pay | Admitting: *Deleted

## 2023-01-17 ENCOUNTER — Inpatient Hospital Stay: Payer: Medicare Other | Attending: Internal Medicine

## 2023-01-17 ENCOUNTER — Other Ambulatory Visit (HOSPITAL_COMMUNITY): Payer: Self-pay

## 2023-01-17 ENCOUNTER — Inpatient Hospital Stay (HOSPITAL_BASED_OUTPATIENT_CLINIC_OR_DEPARTMENT_OTHER): Payer: Medicare Other | Admitting: Physician Assistant

## 2023-01-17 VITALS — BP 177/89 | HR 65 | Temp 97.5°F | Resp 17 | Ht 72.0 in | Wt 238.1 lb

## 2023-01-17 DIAGNOSIS — Z79899 Other long term (current) drug therapy: Secondary | ICD-10-CM | POA: Diagnosis not present

## 2023-01-17 DIAGNOSIS — C3431 Malignant neoplasm of lower lobe, right bronchus or lung: Secondary | ICD-10-CM

## 2023-01-17 DIAGNOSIS — C3411 Malignant neoplasm of upper lobe, right bronchus or lung: Secondary | ICD-10-CM | POA: Diagnosis not present

## 2023-01-17 LAB — CMP (CANCER CENTER ONLY)
ALT: 32 U/L (ref 0–44)
AST: 30 U/L (ref 15–41)
Albumin: 4.1 g/dL (ref 3.5–5.0)
Alkaline Phosphatase: 78 U/L (ref 38–126)
Anion gap: 4 — ABNORMAL LOW (ref 5–15)
BUN: 21 mg/dL (ref 8–23)
CO2: 31 mmol/L (ref 22–32)
Calcium: 9.7 mg/dL (ref 8.9–10.3)
Chloride: 101 mmol/L (ref 98–111)
Creatinine: 1.07 mg/dL (ref 0.61–1.24)
GFR, Estimated: >60 mL/min (ref >60)
Glucose, Bld: 98 mg/dL (ref 70–99)
Potassium: 3.9 mmol/L (ref 3.5–5.1)
Sodium: 136 mmol/L (ref 135–145)
Total Bilirubin: 0.5 mg/dL (ref 0.3–1.2)
Total Protein: 6.6 g/dL (ref 6.5–8.1)

## 2023-01-17 LAB — CBC WITH DIFFERENTIAL (CANCER CENTER ONLY)
Abs Immature Granulocytes: 0.01 10*3/uL (ref 0.00–0.07)
Basophils Absolute: 0 10*3/uL (ref 0.0–0.1)
Basophils Relative: 1 %
Eosinophils Absolute: 0.3 10*3/uL (ref 0.0–0.5)
Eosinophils Relative: 5 %
HCT: 35 % — ABNORMAL LOW (ref 39.0–52.0)
Hemoglobin: 11.9 g/dL — ABNORMAL LOW (ref 13.0–17.0)
Immature Granulocytes: 0 %
Lymphocytes Relative: 32 %
Lymphs Abs: 1.6 10*3/uL (ref 0.7–4.0)
MCH: 34.1 pg — ABNORMAL HIGH (ref 26.0–34.0)
MCHC: 34 g/dL (ref 30.0–36.0)
MCV: 100.3 fL — ABNORMAL HIGH (ref 80.0–100.0)
Monocytes Absolute: 0.7 10*3/uL (ref 0.1–1.0)
Monocytes Relative: 13 %
Neutro Abs: 2.5 10*3/uL (ref 1.7–7.7)
Neutrophils Relative %: 49 %
Platelet Count: 116 10*3/uL — ABNORMAL LOW (ref 150–400)
RBC: 3.49 MIL/uL — ABNORMAL LOW (ref 4.22–5.81)
RDW: 12.8 % (ref 11.5–15.5)
WBC Count: 5 10*3/uL (ref 4.0–10.5)
nRBC: 0 % (ref 0.0–0.2)

## 2023-01-17 MED ORDER — LORAZEPAM 1 MG PO TABS: ORAL_TABLET | ORAL | 0 refills | Status: DC

## 2023-01-17 NOTE — Progress Notes (Signed)
Specialty Pharmacy Ongoing Clinical Assessment Note  [name] is a 77 y.o. male who is being followed by the specialty pharmacy service for RxSp Oncology   Patient's specialty medication(s) reviewed today: Osimertinib Mesylate   Missed doses in the last 4 weeks: 0   Patient/Caregiver did not have any additional questions or concerns.   Therapeutic benefit summary: Unable to assess   Adverse events/side effects summary: No adverse events/side effects   Patient's therapy is appropriate to: Continue    Goals Addressed             This Visit's Progress    Slow Disease Progression       Patient is  unable to be assessed as therapy recently initiated . Patient will maintain adherence and adhere to provider and/or lab appointments          Follow up:  3 months  Otto Herb Specialty Pharmacist

## 2023-01-17 NOTE — Progress Notes (Signed)
Specialty Pharmacy Refill Coordination Note  [name] is a 77 y.o. male contacted today regarding refills of specialty medication(s) Osimertinib Mesylate   Patient requested [name] at [facility] at [place] date: 01/23/23   Medication will be filled on 01/23/23. (Refill too soon with insurance until 01/21/23)

## 2023-01-19 DIAGNOSIS — Z85828 Personal history of other malignant neoplasm of skin: Secondary | ICD-10-CM | POA: Diagnosis not present

## 2023-01-19 DIAGNOSIS — Z08 Encounter for follow-up examination after completed treatment for malignant neoplasm: Secondary | ICD-10-CM | POA: Diagnosis not present

## 2023-01-23 ENCOUNTER — Telehealth: Payer: Self-pay | Admitting: *Deleted

## 2023-01-23 NOTE — Telephone Encounter (Signed)
Wales states he is having laser surgery on his veins 11/7. Will be taking Ibuprofen 600 mg TID for 2 weeks. Wants to know if this is OK with Dr Arbutus Ped.

## 2023-01-24 ENCOUNTER — Other Ambulatory Visit (HOSPITAL_COMMUNITY): Payer: Self-pay

## 2023-01-26 ENCOUNTER — Ambulatory Visit (INDEPENDENT_AMBULATORY_CARE_PROVIDER_SITE_OTHER): Payer: Medicare Other | Admitting: Surgery

## 2023-01-26 ENCOUNTER — Encounter: Payer: Self-pay | Admitting: Surgery

## 2023-01-26 VITALS — BP 172/85 | HR 70 | Temp 97.5°F | Resp 18 | Ht 72.0 in | Wt 233.0 lb

## 2023-01-26 DIAGNOSIS — I83811 Varicose veins of right lower extremities with pain: Secondary | ICD-10-CM | POA: Diagnosis not present

## 2023-01-26 HISTORY — PX: LASER ABLATION: SHX1947

## 2023-01-26 NOTE — Progress Notes (Signed)
Laser Ablation Procedure    Date: 01/26/2023  STANCIL COELHO DOB:1945-12-18  Consent signed: Yes     Surgeon: Dr. Coral Else  Procedure: Laser Ablation: right Greater Saphenous Vein  BP (!) 172/85 (BP Location: Left Arm, Patient Position: Sitting, Cuff Size: Normal)   Pulse 70   Temp (!) 97.5 F (36.4 C) (Temporal)   Resp 18   Ht 6' (1.829 m)   Wt 233 lb (105.7 kg)   SpO2 97%   BMI 31.60 kg/m   Tumescent Anesthesia: 480 cc 0.9% NaCl with 50 cc Lidocaine HCL 1%  and 15 cc 8.4% NaHCO3 Local Anesthesia: 7 cc Lidocaine HCL and NaHCO3 (ratio 2:1)  7 watts continuous mode    Total energy: 606.3 joules   Total time: 86 seconds Treatment Length: 12 cm  Laser Fiber Ref. #[id number]        Lot # [id number]  Stab Phlebectomy: >20 Sites: Thigh and Calf  Patient tolerated procedure well  Notes:  All staff members wore facial masks . Pt had 1 tab (1 Mg ) of ativan at 07:30 am and another 1 tab at 8:20 am prior to surgical procedure making a total of 2 mg of ativan   Description of Procedure: After marking the course of the secondary varicosities, the patient was placed on the operating table in the supine position, and the right leg was prepped and draped in sterile fashion.   Local anesthetic was administered and under ultrasound guidance the saphenous vein was accessed with a micro needle and guide wire; then the mirco puncture sheath was placed.  A guide wire was inserted saphenofemoral junction , followed by a 5 french sheath.  The position of the sheath and then the laser fiber below the junction was confirmed using the ultrasound.  Tumescent anesthesia was administered along the course of the saphenous vein using ultrasound guidance. The patient was placed in Trendelenburg position and protective laser glasses were placed on patient and staff, and the laser was fired at 7 watts continuous mode for a total of 606.3 joules.  For stab phlebectomies, local anesthetic was administered  at the previously marked varicosities, and tumescent anesthesia was administered around the vessels.  Greater than 20 stab wounds were made using the tip of an 11 blade. And using the vein hook, the phlebectomies were performed using a hemostat to avulse the varicosities.  Adequate hemostasis was achieved.   Steri strips were applied to the stab wounds and ABD pads and thigh high compression stockings were applied.  Ace wrap bandages were applied over the phlebectomy sites and at the top of the saphenofemoral junction. Blood loss was less than 15 cc.  Discharge instructions reviewed with patient and hardcopy of discharge instructions given to patient to take home. The patient ambulated out of the operating room having tolerated the procedure well.

## 2023-02-01 NOTE — Progress Notes (Signed)
[facility] OFFICE PROGRESS NOTE  [name], MD 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 Dorris Fetch on 01/23/2006.  2) Tarceva 150 mg by mouth daily as part of the BMS checkmate 370 clinical trial. Status post 6 weeks of treatment. Tarceva 100 mg by mouth daily as part of the BMS Checkmate 370 clinical trial. First dose started 07/17/2014. Discontinued due to disease progression in September 2024  CURRENT THERAPY: Tagrisso 80 mg p.o. daily. First dose on 12/28/22   INTERVAL HISTORY: [name] 77 y.o. male returns to the clinic today for a follow-up visit. In September 2024, a restaging CT scan that showed some progressive right lower lobe nodularity compatible with metastatic disease.  Therefore, his treatment was changed from Tarceva to Tagrisso.  He is first dose was on 12/28/2022 and so far he has been tolerating it fine.   Since last being seen, he had a vein ablation which he tolerated well. He is expected to have a follow up with his vascular provider later this week. He is wearing his compression stockings.   He denies changes in her energy. Overall his energy is poor to fair but he tries to stay active and he states this is not a drastic change from his baseline.  He denies any fever, chills, night sweats, or unexplained weight loss.  Denies any chest pain, cough, or hemoptysis. He has some mild dyspnea on exertion, most noticeable with the stairs. He denies cough. He denies chest pain or hemoptysis. He denies any nausea, vomiting, diarrhea, or constipation.  Denies any headache or visual changes. He denies rashes or skin changes.  He is here today for evaluation and repeat blood work.     MEDICAL  HISTORY: Past Medical History:  Diagnosis Date   Cholelithiases 07/16/2015   Complication of anesthesia 2007   quit breathing with bronchoscopy, had to spend night   History of agent Orange exposure 44-45 yrs ago   History of pneumonia  last 3-4 yrs ago   3 -4 different times   Hypercholesterolemia    Hypertension    Hypothyroidism    lung ca dx'd 2007   lung right   Sinus disease    hx of   Sleep apnea    uses cpap setting opf 12    ALLERGIES:  is allergic to lisinopril, prednisone, and furosemide.  MEDICATIONS:  Current Outpatient Medications  Medication Sig Dispense Refill   amitriptyline (ELAVIL) 50 MG tablet Take 50 mg by mouth at bedtime.     aspirin 81 MG tablet Take 81 mg by mouth every morning.      Cholecalciferol 2000 units CAPS Take 2,000 Units by mouth daily.      clindamycin (CLEOCIN T) 1 % external solution APPLY TO AFFECTED AREA(S) TWO TIMES A DAY AS NEEDED FOR SKIN IRRITATION 30 mL 0   cloNIDine (CATAPRES) 0.1 MG tablet Take 0.1 mg by mouth daily.     cloNIDine (CATAPRES) 0.2 MG tablet Take 0.2 mg by mouth daily.     levETIRAcetam (KEPPRA) 750 MG tablet Take 750 mg by mouth at bedtime.      metoprolol succinate (TOPROL-XL) 50 MG 24 hr tablet Take 1 tablet (50 mg total) by mouth daily. Take with  or immediately following a meal. 90 tablet 3   Multiple Vitamins-Minerals (CENTRUM SILVER PO) Take 1 tablet by mouth daily.      nitroGLYCERIN (NITROSTAT) 0.4 MG SL tablet DISSOLVE 1 TAB UNDER TONGUE FOR CHEST PAIN - IF PAIN REMAINS AFTER 5 MIN, CALL 911 AND REPEAT DOSE. MAX 3 TABS IN 15 MINUTES 25 tablet 10   osimertinib mesylate (TAGRISSO) 80 MG tablet Take 1 tablet (80 mg total) by mouth daily. 30 tablet 3   polyethylene glycol (MIRALAX / GLYCOLAX) packet Take 17 g by mouth daily. Reported on 08/27/2015     psyllium (METAMUCIL) 58.6 % powder Take 1 packet by mouth daily. Reported on 08/27/2015     rosuvastatin (CRESTOR) 5 MG tablet Take 5 mg by mouth every morning.       SYNTHROID 112 MCG tablet Take 112 mcg by mouth daily.     valsartan-hydrochlorothiazide (DIOVAN-HCT) 320-25 MG per tablet Take 1 tablet by mouth every morning.      No current facility-administered medications for this visit.    SURGICAL HISTORY:  Past Surgical History:  Procedure Laterality Date   BIOPSY  05/21/2018   Procedure: BIOPSY;  Surgeon: Graylin Shiver, MD;  Location: [facility];  Service: Endoscopy;;   BIOPSY  05/11/2021   Procedure: BIOPSY;  Surgeon: Kathi Der, MD;  Location: [facility];  Service: Gastroenterology;;   COLONOSCOPY WITH PROPOFOL N/A 03/19/2013   Procedure: COLONOSCOPY WITH PROPOFOL;  Surgeon: Charolett Bumpers, MD;  Location: [facility];  Service: Endoscopy;  Laterality: N/A;   COLONOSCOPY WITH PROPOFOL N/A 05/11/2021   Procedure: COLONOSCOPY WITH PROPOFOL;  Surgeon: Kathi Der, MD;  Location: [facility];  Service: Gastroenterology;  Laterality: N/A;   Cystourethroscopy, [facility] TURP.  04/16/2010   ESOPHAGOGASTRODUODENOSCOPY (EGD) WITH PROPOFOL N/A 05/21/2018   Procedure: ESOPHAGOGASTRODUODENOSCOPY (EGD) WITH PROPOFOL;  Surgeon: Graylin Shiver, MD;  Location: [facility];  Service: Endoscopy;  Laterality: N/A;   LASER ABLATION Right 01/26/2023   Laser ablation of right greater saphenous vein with >20 stab phlebectomies   NASAL SINUS SURGERY  04/04/1985   POLYPECTOMY  05/11/2021   Procedure: POLYPECTOMY;  Surgeon: Kathi Der, MD;  Location: [facility];  Service: Gastroenterology;;   Right video-assisted thoracoscopy, right upper lobectomy and  01/23/2006   The Olympus video bronchoscope was introduced via the right  12/21/2005   TONSILLECTOMY  04/05/1955   Torn medial and lateral menisci, left knee.  11/06/2006    REVIEW OF SYSTEMS:   Constitutional: Negative for appetite change, chills, fatigue, fever and unexpected weight change.  HENT: Negative for mouth sores, nosebleeds, sore throat and trouble swallowing.   Eyes: Negative  for eye problems and icterus.  Respiratory: Mild occasional dyspnea on exertion. Negative for cough, hemoptysis, and wheezing.   Cardiovascular: Negative for chest pain. Positive for lower extremity swelling.  Gastrointestinal: Negative for abdominal pain, constipation, diarrhea, nausea and vomiting.  Genitourinary: Negative for bladder incontinence, difficulty urinating, dysuria, frequency and hematuria.   Musculoskeletal: Negative for back pain, gait problem, neck pain and neck stiffness.  Skin: Negative for itching and rash.  Neurological: Negative for dizziness, extremity weakness, gait problem, headaches, light-headedness and seizures.  Hematological: Negative for adenopathy. Does not bruise/bleed easily.  Psychiatric/Behavioral: Negative for confusion, depression and sleep disturbance. The patient is not nervous/anxious.       PHYSICAL EXAMINATION:  Blood pressure (!) 152/70, pulse 63, temperature 98.2 F (36.8 C), resp. rate 13, weight 232 lb 1.6 oz (105.3 kg), SpO2 100%.  ECOG PERFORMANCE STATUS: 1  Physical Exam  Constitutional: Oriented to person, place, and time and well-developed, well-nourished, and in no distress.  HENT:  Head: Normocephalic and atraumatic.  Mouth/Throat: Oropharynx is clear and moist. No oropharyngeal exudate.  Eyes: Conjunctivae are normal. Right eye exhibits no discharge. Left eye exhibits no discharge. No scleral icterus.  Neck: Normal range of motion. Neck supple.  Cardiovascular: Normal rate, regular rhythm, normal heart sounds and intact distal pulses.   Pulmonary/Chest: Effort normal and breath sounds normal. No respiratory distress. No wheezes. No rales.  Abdominal: Soft. Bowel sounds are normal. Exhibits no distension and no mass. There is no tenderness.  Musculoskeletal: Normal range of motion. Wearing compression stockings. Lymphadenopathy:    No cervical adenopathy.  Neurological: Alert and oriented to person, place, and time. Exhibits  normal muscle tone. Gait normal. Coordination normal.  Skin: Skin is warm and dry. No rash noted. Not diaphoretic. No erythema. No pallor.  Psychiatric: Mood, memory and judgment normal.  Vitals reviewed.  LABORATORY DATA: Lab Results  Component Value Date   WBC 5.4 02/07/2023   HGB 12.0 (L) 02/07/2023   HCT 35.3 (L) 02/07/2023   MCV 100.9 (H) 02/07/2023   PLT 134 (L) 02/07/2023      Chemistry      Component Value Date/Time   NA 135 02/07/2023 0850   NA 141 03/11/2019 0959   NA 139 04/06/2017 0932   K 3.8 02/07/2023 0850   K 3.8 04/06/2017 0932   CL 98 02/07/2023 0850   CL 102 04/24/2012 0853   CO2 33 (H) 02/07/2023 0850   CO2 29 04/06/2017 0932   BUN 19 02/07/2023 0850   BUN 18 03/11/2019 0959   BUN 14.4 04/06/2017 0932   CREATININE 1.08 02/07/2023 0850   CREATININE 1.0 04/06/2017 0932      Component Value Date/Time   CALCIUM 9.7 02/07/2023 0850   CALCIUM 9.5 04/06/2017 0932   ALKPHOS 80 02/07/2023 0850   ALKPHOS 58 04/06/2017 0932   AST 32 02/07/2023 0850   AST 27 04/06/2017 0932   ALT 31 02/07/2023 0850   ALT 27 04/06/2017 0932   BILITOT 0.5 02/07/2023 0850   BILITOT 0.45 04/06/2017 0932       RADIOGRAPHIC STUDIES:  No results found.   ASSESSMENT/PLAN:  This is a very pleasant 77 year old Caucasian male with recurrent non-small cell lung cancer, adenocarcinoma.  He is positive for EGFR mutation and 21.  He was on Tarceva 100 mg by mouth daily status post 101 months of treatment.   At his appointment in September 2024, the patient had restaging CT scan at that time that showed some progressive disease with progressive right lower lobe nodularity .   Therefore, his treatment was changed to Tagrisso 80 mg p.o. daily.  He started this on 12/28/2022 and thus far has been tolerating it well.    Labs were reviewed.  Recommend that he continue with Tagrisso at the same dose.   We will see him back for follow-up visit in 4 weeks for evaluation repeat blood  work and to review his restaging CT scan .    The patient was advised to call immediately if she has any concerning symptoms in the interval. The patient voices understanding of current disease status and treatment options and is in agreement with the current care plan. All questions were answered. The patient knows to call the clinic with any problems, questions or concerns. We can certainly see the patient much sooner if necessary     Orders Placed  This Encounter  Procedures   CT CHEST ABDOMEN PELVIS W CONTRAST    Standing Status:   Future    Standing Expiration Date:   02/07/2024    Order Specific Question:   If indicated for the ordered procedure, I authorize the administration of contrast media per Radiology protocol    Answer:   Yes    Order Specific Question:   Does the patient have a contrast media/X-ray dye allergy?    Answer:   No    Order Specific Question:   Preferred imaging location?    Answer:   [facility]    Order Specific Question:   If indicated for the ordered procedure, I authorize the administration of oral contrast media per Radiology protocol    Answer:   Yes   CBC with Differential (Cancer Center Only)    Standing Status:   Future    Standing Expiration Date:   02/07/2024   CMP (Cancer Center only)    Standing Status:   Future    Standing Expiration Date:   02/07/2024     The total time spent in the appointment was 20-29 minutes  Tashauna Caisse L Christal Lagerstrom, PA-C 02/07/23

## 2023-02-06 ENCOUNTER — Other Ambulatory Visit: Payer: Self-pay | Admitting: Physician Assistant

## 2023-02-06 DIAGNOSIS — C3431 Malignant neoplasm of lower lobe, right bronchus or lung: Secondary | ICD-10-CM

## 2023-02-07 ENCOUNTER — Inpatient Hospital Stay (HOSPITAL_BASED_OUTPATIENT_CLINIC_OR_DEPARTMENT_OTHER): Payer: Medicare Other | Admitting: Physician Assistant

## 2023-02-07 ENCOUNTER — Inpatient Hospital Stay: Payer: Medicare Other | Attending: Internal Medicine

## 2023-02-07 VITALS — BP 152/70 | HR 63 | Temp 98.2°F | Resp 13 | Wt 232.1 lb

## 2023-02-07 DIAGNOSIS — Z902 Acquired absence of lung [part of]: Secondary | ICD-10-CM | POA: Insufficient documentation

## 2023-02-07 DIAGNOSIS — C3431 Malignant neoplasm of lower lobe, right bronchus or lung: Secondary | ICD-10-CM

## 2023-02-07 DIAGNOSIS — C3411 Malignant neoplasm of upper lobe, right bronchus or lung: Secondary | ICD-10-CM | POA: Insufficient documentation

## 2023-02-07 DIAGNOSIS — Z79899 Other long term (current) drug therapy: Secondary | ICD-10-CM | POA: Insufficient documentation

## 2023-02-07 LAB — CBC WITH DIFFERENTIAL (CANCER CENTER ONLY)
Abs Immature Granulocytes: 0.01 10*3/uL (ref 0.00–0.07)
Basophils Absolute: 0 10*3/uL (ref 0.0–0.1)
Basophils Relative: 1 %
Eosinophils Absolute: 0.3 10*3/uL (ref 0.0–0.5)
Eosinophils Relative: 5 %
HCT: 35.3 % — ABNORMAL LOW (ref 39.0–52.0)
Hemoglobin: 12 g/dL — ABNORMAL LOW (ref 13.0–17.0)
Immature Granulocytes: 0 %
Lymphocytes Relative: 28 %
Lymphs Abs: 1.5 10*3/uL (ref 0.7–4.0)
MCH: 34.3 pg — ABNORMAL HIGH (ref 26.0–34.0)
MCHC: 34 g/dL (ref 30.0–36.0)
MCV: 100.9 fL — ABNORMAL HIGH (ref 80.0–100.0)
Monocytes Absolute: 0.8 10*3/uL (ref 0.1–1.0)
Monocytes Relative: 14 %
Neutro Abs: 2.9 10*3/uL (ref 1.7–7.7)
Neutrophils Relative %: 52 %
Platelet Count: 134 10*3/uL — ABNORMAL LOW (ref 150–400)
RBC: 3.5 MIL/uL — ABNORMAL LOW (ref 4.22–5.81)
RDW: 12.4 % (ref 11.5–15.5)
WBC Count: 5.4 10*3/uL (ref 4.0–10.5)
nRBC: 0 % (ref 0.0–0.2)

## 2023-02-07 LAB — CMP (CANCER CENTER ONLY)
ALT: 31 U/L (ref 0–44)
AST: 32 U/L (ref 15–41)
Albumin: 4 g/dL (ref 3.5–5.0)
Alkaline Phosphatase: 80 U/L (ref 38–126)
Anion gap: 4 — ABNORMAL LOW (ref 5–15)
BUN: 19 mg/dL (ref 8–23)
CO2: 33 mmol/L — ABNORMAL HIGH (ref 22–32)
Calcium: 9.7 mg/dL (ref 8.9–10.3)
Chloride: 98 mmol/L (ref 98–111)
Creatinine: 1.08 mg/dL (ref 0.61–1.24)
GFR, Estimated: >60 mL/min (ref >60)
Glucose, Bld: 103 mg/dL — ABNORMAL HIGH (ref 70–99)
Potassium: 3.8 mmol/L (ref 3.5–5.1)
Sodium: 135 mmol/L (ref 135–145)
Total Bilirubin: 0.5 mg/dL (ref <1.2)
Total Protein: 6.7 g/dL (ref 6.5–8.1)

## 2023-02-09 ENCOUNTER — Encounter: Payer: Self-pay | Admitting: Surgery

## 2023-02-09 ENCOUNTER — Ambulatory Visit (INDEPENDENT_AMBULATORY_CARE_PROVIDER_SITE_OTHER): Payer: Medicare Other | Admitting: Surgery

## 2023-02-09 ENCOUNTER — Ambulatory Visit (HOSPITAL_COMMUNITY)
Admission: RE | Admit: 2023-02-09 | Discharge: 2023-02-09 | Disposition: A | Discharge status: Home / Self Care | Payer: Medicare Other | Source: Ambulatory Visit | Attending: Vascular Surgery | Admitting: Vascular Surgery

## 2023-02-09 ENCOUNTER — Other Ambulatory Visit: Payer: Self-pay

## 2023-02-09 VITALS — BP 161/85 | HR 60 | Temp 98.0°F | Ht 72.0 in | Wt 235.3 lb

## 2023-02-09 DIAGNOSIS — I83811 Varicose veins of right lower extremities with pain: Secondary | ICD-10-CM

## 2023-02-09 DIAGNOSIS — M7989 Other specified soft tissue disorders: Secondary | ICD-10-CM

## 2023-02-09 NOTE — Progress Notes (Signed)
Patient name: [name] MRN: [id number] DOB: 1945-04-06 Sex: male  Coleman FOR VISIT:    Postop  HISTORY OF PRESENT ILLNESS:   [name] is a 77 y.o. male with CEAP class IV venous disease.  He is back today for follow-up.  On 01/26/2023 he underwent endovenous laser ablation of the right saphenous vein as well as stab phlebectomy, greater than 20 of his varicosities.  CURRENT MEDICATIONS:    Current Outpatient Medications  Medication Sig Dispense Refill   amitriptyline (ELAVIL) 50 MG tablet Take 50 mg by mouth at bedtime.     aspirin 81 MG tablet Take 81 mg by mouth every morning.      Cholecalciferol 2000 units CAPS Take 2,000 Units by mouth daily.      clindamycin (CLEOCIN T) 1 % external solution APPLY TO AFFECTED AREA(S) TWO TIMES A DAY AS NEEDED FOR SKIN IRRITATION 30 mL 0   cloNIDine (CATAPRES) 0.1 MG tablet Take 0.1 mg by mouth daily.     cloNIDine (CATAPRES) 0.2 MG tablet Take 0.2 mg by mouth daily.     levETIRAcetam (KEPPRA) 750 MG tablet Take 750 mg by mouth at bedtime.      metoprolol succinate (TOPROL-XL) 50 MG 24 hr tablet Take 1 tablet (50 mg total) by mouth daily. Take with or immediately following a meal. 90 tablet 3   Multiple Vitamins-Minerals (CENTRUM SILVER PO) Take 1 tablet by mouth daily.      nitroGLYCERIN (NITROSTAT) 0.4 MG SL tablet DISSOLVE 1 TAB UNDER TONGUE FOR CHEST PAIN - IF PAIN REMAINS AFTER 5 MIN, CALL 911 AND REPEAT DOSE. MAX 3 TABS IN 15 MINUTES 25 tablet 10   osimertinib mesylate (TAGRISSO) 80 MG tablet Take 1 tablet (80 mg total) by mouth daily. 30 tablet 3   polyethylene glycol (MIRALAX / GLYCOLAX) packet Take 17 g by mouth daily. Reported on 08/27/2015     psyllium (METAMUCIL) 58.6 % powder Take 1 packet by mouth daily. Reported on 08/27/2015     rosuvastatin (CRESTOR) 5 MG tablet Take 5 mg by mouth every morning.      SYNTHROID 112 MCG tablet Take 112 mcg by mouth daily.      valsartan-hydrochlorothiazide (DIOVAN-HCT) 320-25 MG per tablet Take 1 tablet by mouth every morning.      No current facility-administered medications for this visit.    REVIEW OF SYSTEMS:   [X]  denotes positive finding, [ ]  denotes negative finding Cardiac  Comments:  Chest pain or chest pressure:    Shortness of breath upon exertion:    Short of breath when lying flat:    Irregular heart rhythm:    Constitutional    Fever or chills:      PHYSICAL EXAM:   Vitals:   02/09/23 1020  BP: (!) 161/85  Pulse: 60  Temp: 98 F (36.7 C)  TempSrc: Temporal  SpO2: 99%  Weight: 235 lb 4.8 oz (106.7 kg)  Height: 6' (1.829 m)    GENERAL: The patient is a well-nourished male, in no acute distress. The vital signs are documented above. CARDIOVASCULAR: There is a regular rate and rhythm. PULMONARY: Non-labored respirations Incisions are healing appropriately  STUDIES:   Right:  - No evidence of deep vein thrombosis seen in the right lower extremity,  from the common femoral through the popliteal veins.    - Successful laser ablation of the right great saphenous vein from the  distal great saphenous vein to within 0.55cm of the saphenofemoral  junction.  MEDICAL ISSUES:   Doing well status post laser ablation and stab phlebectomy of the right leg.  His left leg does not bother him and so he wants to wait before walking on this leg.  He will contact me if he wants to do this.  Otherwise he is doing very well.  Ultrasound shows successful ablation of the saphenous vein with no evidence of DVT he will follow-up on an as-needed basis  Charlena Cross, MD, FACS [facility] [phone] Pager [phone]

## 2023-02-14 ENCOUNTER — Other Ambulatory Visit (HOSPITAL_COMMUNITY): Payer: Self-pay

## 2023-02-14 ENCOUNTER — Other Ambulatory Visit: Payer: Self-pay | Admitting: *Deleted

## 2023-02-14 ENCOUNTER — Other Ambulatory Visit (HOSPITAL_COMMUNITY): Payer: Self-pay | Admitting: Pharmacy Technician

## 2023-02-14 DIAGNOSIS — I83812 Varicose veins of left lower extremities with pain: Secondary | ICD-10-CM

## 2023-02-14 NOTE — Progress Notes (Signed)
Specialty Pharmacy Refill Coordination Note  [name] is a 77 y.o. male contacted today regarding refills of specialty medication(s) Osimertinib Mesylate   Patient requested [name] at [facility] at [place] date: 02/20/23   Medication will be filled on 02/17/23.

## 2023-02-17 ENCOUNTER — Other Ambulatory Visit: Payer: Self-pay

## 2023-02-17 DIAGNOSIS — L0100 Impetigo, unspecified: Secondary | ICD-10-CM | POA: Diagnosis not present

## 2023-02-17 DIAGNOSIS — Z08 Encounter for follow-up examination after completed treatment for malignant neoplasm: Secondary | ICD-10-CM | POA: Diagnosis not present

## 2023-02-17 DIAGNOSIS — Z85828 Personal history of other malignant neoplasm of skin: Secondary | ICD-10-CM | POA: Diagnosis not present

## 2023-02-20 ENCOUNTER — Other Ambulatory Visit (HOSPITAL_COMMUNITY): Payer: Self-pay

## 2023-03-06 ENCOUNTER — Inpatient Hospital Stay: Payer: Medicare Other | Attending: Internal Medicine

## 2023-03-06 DIAGNOSIS — Z902 Acquired absence of lung [part of]: Secondary | ICD-10-CM | POA: Diagnosis not present

## 2023-03-06 DIAGNOSIS — C3431 Malignant neoplasm of lower lobe, right bronchus or lung: Secondary | ICD-10-CM

## 2023-03-06 DIAGNOSIS — Z79899 Other long term (current) drug therapy: Secondary | ICD-10-CM | POA: Insufficient documentation

## 2023-03-06 DIAGNOSIS — C3411 Malignant neoplasm of upper lobe, right bronchus or lung: Secondary | ICD-10-CM | POA: Insufficient documentation

## 2023-03-06 LAB — CBC WITH DIFFERENTIAL (CANCER CENTER ONLY)
Abs Immature Granulocytes: 0.01 10*3/uL (ref 0.00–0.07)
Basophils Absolute: 0 10*3/uL (ref 0.0–0.1)
Basophils Relative: 1 %
Eosinophils Absolute: 0.2 10*3/uL (ref 0.0–0.5)
Eosinophils Relative: 4 %
HCT: 33.3 % — ABNORMAL LOW (ref 39.0–52.0)
Hemoglobin: 11.6 g/dL — ABNORMAL LOW (ref 13.0–17.0)
Immature Granulocytes: 0 %
Lymphocytes Relative: 24 %
Lymphs Abs: 1.3 10*3/uL (ref 0.7–4.0)
MCH: 34.9 pg — ABNORMAL HIGH (ref 26.0–34.0)
MCHC: 34.8 g/dL (ref 30.0–36.0)
MCV: 100.3 fL — ABNORMAL HIGH (ref 80.0–100.0)
Monocytes Absolute: 0.8 10*3/uL (ref 0.1–1.0)
Monocytes Relative: 15 %
Neutro Abs: 2.9 10*3/uL (ref 1.7–7.7)
Neutrophils Relative %: 56 %
Platelet Count: 132 10*3/uL — ABNORMAL LOW (ref 150–400)
RBC: 3.32 MIL/uL — ABNORMAL LOW (ref 4.22–5.81)
RDW: 12.7 % (ref 11.5–15.5)
WBC Count: 5.2 10*3/uL (ref 4.0–10.5)
nRBC: 0 % (ref 0.0–0.2)

## 2023-03-06 LAB — CMP (CANCER CENTER ONLY)
ALT: 32 U/L (ref 0–44)
AST: 45 U/L — ABNORMAL HIGH (ref 15–41)
Albumin: 4 g/dL (ref 3.5–5.0)
Alkaline Phosphatase: 95 U/L (ref 38–126)
Anion gap: 5 (ref 5–15)
BUN: 17 mg/dL (ref 8–23)
CO2: 31 mmol/L (ref 22–32)
Calcium: 9.6 mg/dL (ref 8.9–10.3)
Chloride: 97 mmol/L — ABNORMAL LOW (ref 98–111)
Creatinine: 1.02 mg/dL (ref 0.61–1.24)
GFR, Estimated: >60 mL/min (ref >60)
Glucose, Bld: 80 mg/dL (ref 70–99)
Potassium: 3.9 mmol/L (ref 3.5–5.1)
Sodium: 133 mmol/L — ABNORMAL LOW (ref 135–145)
Total Bilirubin: 0.5 mg/dL (ref <1.2)
Total Protein: 6.6 g/dL (ref 6.5–8.1)

## 2023-03-07 ENCOUNTER — Ambulatory Visit (HOSPITAL_COMMUNITY)
Admission: RE | Admit: 2023-03-07 | Discharge: 2023-03-07 | Disposition: A | Discharge status: Home / Self Care | Payer: Medicare Other | Source: Ambulatory Visit | Attending: Physician Assistant | Admitting: Physician Assistant

## 2023-03-07 DIAGNOSIS — C3431 Malignant neoplasm of lower lobe, right bronchus or lung: Secondary | ICD-10-CM | POA: Diagnosis not present

## 2023-03-07 DIAGNOSIS — I700 Atherosclerosis of aorta: Secondary | ICD-10-CM | POA: Diagnosis not present

## 2023-03-07 DIAGNOSIS — K8020 Calculus of gallbladder without cholecystitis without obstruction: Secondary | ICD-10-CM | POA: Diagnosis not present

## 2023-03-07 DIAGNOSIS — C3490 Malignant neoplasm of unspecified part of unspecified bronchus or lung: Secondary | ICD-10-CM | POA: Diagnosis not present

## 2023-03-07 DIAGNOSIS — R222 Localized swelling, mass and lump, trunk: Secondary | ICD-10-CM | POA: Diagnosis not present

## 2023-03-07 MED ORDER — IOHEXOL 300 MG/ML  SOLN
100.0000 mL | Freq: Once | INTRAMUSCULAR | Status: AC | PRN
  Administered 2023-03-07: 100 mL via INTRAVENOUS

## 2023-03-11 NOTE — Progress Notes (Unsigned)
[facility] OFFICE PROGRESS NOTE  [name], MD 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 Dorris Fetch on 01/23/2006.  2) Tarceva 150 mg by mouth daily as part of the BMS checkmate 370 clinical trial. Status post 6 weeks of treatment. Tarceva 100 mg by mouth daily as part of the BMS Checkmate 370 clinical trial. First dose started 07/17/2014. Discontinued due to disease progression in September 2024  CURRENT THERAPY: Tagrisso 80 mg p.o. daily. First dose on 12/28/22   INTERVAL HISTORY: [name] 77 y.o. male returnsto the clinic today for a follow-up visit. In September 2024, a restaging CT scan that showed some progressive right lower lobe nodularity compatible with metastatic disease.  Therefore, his treatment was changed from Tarceva to Tagrisso.  He is first dose was on 12/28/2022 and so far he has been tolerating it fine.   He denies changes in her energy. Overall his energy is poor to fair but he tries to stay active and he states this is not a drastic change from his baseline.  He denies any fever, chills, night sweats, or unexplained weight loss.  Denies any chest pain, cough, or hemoptysis. He has some mild dyspnea on exertion, most noticeable with the stairs. He denies cough. He denies chest pain or hemoptysis. He denies any nausea, vomiting, diarrhea, or constipation.  Denies any headache or visual changes. He denies rashes or skin changes. He recently had a restaging CT scan performed.  He is here today for evaluation and to review her scan results.   MEDICAL HISTORY: Past Medical History:  Diagnosis Date   Cholelithiases 07/16/2015   Complication of anesthesia 2007   quit breathing with bronchoscopy,  had to spend night   History of agent Orange exposure 44-45 yrs ago   History of pneumonia  last 3-4 yrs ago   3 -4 different times   Hypercholesterolemia    Hypertension    Hypothyroidism    lung ca dx'd 2007   lung right   Sinus disease    hx of   Sleep apnea    uses cpap setting opf 12    ALLERGIES:  is allergic to lisinopril, prednisone, and furosemide.  MEDICATIONS:  Current Outpatient Medications  Medication Sig Dispense Refill   amitriptyline (ELAVIL) 50 MG tablet Take 50 mg by mouth at bedtime.     aspirin 81 MG tablet Take 81 mg by mouth every morning.      Cholecalciferol 2000 units CAPS Take 2,000 Units by mouth daily.      clindamycin (CLEOCIN T) 1 % external solution APPLY TO AFFECTED AREA(S) TWO TIMES A DAY AS NEEDED FOR SKIN IRRITATION 30 mL 0   cloNIDine (CATAPRES) 0.1 MG tablet Take 0.1 mg by mouth daily.     cloNIDine (CATAPRES) 0.2 MG tablet Take 0.2 mg by mouth daily.     levETIRAcetam (KEPPRA) 750 MG tablet Take 750 mg by mouth at bedtime.      metoprolol succinate (TOPROL-XL) 50 MG 24 hr tablet Take 1 tablet (50 mg total) by mouth daily. Take with or immediately following a meal. 90 tablet 3   Multiple Vitamins-Minerals (CENTRUM SILVER PO) Take 1 tablet by mouth daily.      nitroGLYCERIN (NITROSTAT) 0.4  MG SL tablet DISSOLVE 1 TAB UNDER TONGUE FOR CHEST PAIN - IF PAIN REMAINS AFTER 5 MIN, CALL 911 AND REPEAT DOSE. MAX 3 TABS IN 15 MINUTES 25 tablet 10   osimertinib mesylate (TAGRISSO) 80 MG tablet Take 1 tablet (80 mg total) by mouth daily. 30 tablet 3   polyethylene glycol (MIRALAX / GLYCOLAX) packet Take 17 g by mouth daily. Reported on 08/27/2015     psyllium (METAMUCIL) 58.6 % powder Take 1 packet by mouth daily. Reported on 08/27/2015     rosuvastatin (CRESTOR) 5 MG tablet Take 5 mg by mouth every morning.      SYNTHROID 112 MCG tablet Take 112 mcg by mouth daily.     valsartan-hydrochlorothiazide (DIOVAN-HCT) 320-25 MG per tablet Take 1 tablet by mouth  every morning.      No current facility-administered medications for this visit.    SURGICAL HISTORY:  Past Surgical History:  Procedure Laterality Date   BIOPSY  05/21/2018   Procedure: BIOPSY;  Surgeon: Graylin Shiver, MD;  Location: [facility];  Service: Endoscopy;;   BIOPSY  05/11/2021   Procedure: BIOPSY;  Surgeon: Kathi Der, MD;  Location: [facility];  Service: Gastroenterology;;   COLONOSCOPY WITH PROPOFOL N/A 03/19/2013   Procedure: COLONOSCOPY WITH PROPOFOL;  Surgeon: Charolett Bumpers, MD;  Location: [facility];  Service: Endoscopy;  Laterality: N/A;   COLONOSCOPY WITH PROPOFOL N/A 05/11/2021   Procedure: COLONOSCOPY WITH PROPOFOL;  Surgeon: Kathi Der, MD;  Location: [facility];  Service: Gastroenterology;  Laterality: N/A;   Cystourethroscopy, [facility] TURP.  04/16/2010   ESOPHAGOGASTRODUODENOSCOPY (EGD) WITH PROPOFOL N/A 05/21/2018   Procedure: ESOPHAGOGASTRODUODENOSCOPY (EGD) WITH PROPOFOL;  Surgeon: Graylin Shiver, MD;  Location: [facility];  Service: Endoscopy;  Laterality: N/A;   LASER ABLATION Right 01/26/2023   Laser ablation of right greater saphenous vein with >20 stab phlebectomies   NASAL SINUS SURGERY  04/04/1985   POLYPECTOMY  05/11/2021   Procedure: POLYPECTOMY;  Surgeon: Kathi Der, MD;  Location: [facility];  Service: Gastroenterology;;   Right video-assisted thoracoscopy, right upper lobectomy and  01/23/2006   The Olympus video bronchoscope was introduced via the right  12/21/2005   TONSILLECTOMY  04/05/1955   Torn medial and lateral menisci, left knee.  11/06/2006    REVIEW OF SYSTEMS:   Review of Systems  Constitutional: Negative for appetite change, chills, fatigue, fever and unexpected weight change.  HENT:   Negative for mouth sores, nosebleeds, sore throat and trouble swallowing.   Eyes: Negative for eye problems and icterus.  Respiratory: Negative for cough, hemoptysis, shortness of breath and wheezing.    Cardiovascular: Negative for chest pain and leg swelling.  Gastrointestinal: Negative for abdominal pain, constipation, diarrhea, nausea and vomiting.  Genitourinary: Negative for bladder incontinence, difficulty urinating, dysuria, frequency and hematuria.   Musculoskeletal: Negative for back pain, gait problem, neck pain and neck stiffness.  Skin: Negative for itching and rash.  Neurological: Negative for dizziness, extremity weakness, gait problem, headaches, light-headedness and seizures.  Hematological: Negative for adenopathy. Does not bruise/bleed easily.  Psychiatric/Behavioral: Negative for confusion, depression and sleep disturbance. The patient is not nervous/anxious.     PHYSICAL EXAMINATION:  There were no vitals taken for this visit.  ECOG PERFORMANCE STATUS: {CHL ONC ECOG [id number]  Physical Exam  Constitutional: Oriented to person, place, and time and well-developed, well-nourished, and in no distress. No distress.  HENT:  Head: Normocephalic and atraumatic.  Mouth/Throat: Oropharynx is clear and moist. No oropharyngeal exudate.  Eyes: Conjunctivae are normal.  Right eye exhibits no discharge. Left eye exhibits no discharge. No scleral icterus.  Neck: Normal range of motion. Neck supple.  Cardiovascular: Normal rate, regular rhythm, normal heart sounds and intact distal pulses.   Pulmonary/Chest: Effort normal and breath sounds normal. No respiratory distress. No wheezes. No rales.  Abdominal: Soft. Bowel sounds are normal. Exhibits no distension and no mass. There is no tenderness.  Musculoskeletal: Normal range of motion. Exhibits no edema.  Lymphadenopathy:    No cervical adenopathy.  Neurological: Alert and oriented to person, place, and time. Exhibits normal muscle tone. Gait normal. Coordination normal.  Skin: Skin is warm and dry. No rash noted. Not diaphoretic. No erythema. No pallor.  Psychiatric: Mood, memory and judgment normal.  Vitals  reviewed.  LABORATORY DATA: Lab Results  Component Value Date   WBC 5.2 03/06/2023   HGB 11.6 (L) 03/06/2023   HCT 33.3 (L) 03/06/2023   MCV 100.3 (H) 03/06/2023   PLT 132 (L) 03/06/2023      Chemistry      Component Value Date/Time   NA 133 (L) 03/06/2023 1215   NA 141 03/11/2019 0959   NA 139 04/06/2017 0932   K 3.9 03/06/2023 1215   K 3.8 04/06/2017 0932   CL 97 (L) 03/06/2023 1215   CL 102 04/24/2012 0853   CO2 31 03/06/2023 1215   CO2 29 04/06/2017 0932   BUN 17 03/06/2023 1215   BUN 18 03/11/2019 0959   BUN 14.4 04/06/2017 0932   CREATININE 1.02 03/06/2023 1215   CREATININE 1.0 04/06/2017 0932      Component Value Date/Time   CALCIUM 9.6 03/06/2023 1215   CALCIUM 9.5 04/06/2017 0932   ALKPHOS 95 03/06/2023 1215   ALKPHOS 58 04/06/2017 0932   AST 45 (H) 03/06/2023 1215   AST 27 04/06/2017 0932   ALT 32 03/06/2023 1215   ALT 27 04/06/2017 0932   BILITOT 0.5 03/06/2023 1215   BILITOT 0.45 04/06/2017 0932       RADIOGRAPHIC STUDIES:  VAS [place] LOWER EXTREMITY VENOUS POST ABLATION  Result Date: 02/09/2023  Lower Venous Reflux Study Patient Name:  COLSEN EASTMAN  Date of Exam:   02/09/2023 Medical Rec #: [id number]        Accession #:    [id number] Date of Birth: November 14, 1945        Patient Gender: M Patient Age:   77 years Exam Location:  [facility] Procedure:      VAS [place] LOWER EXTREMITY VENOUS POST ABLATION Referring Phys: Coral Else --------------------------------------------------------------------------------  Indications: S/p right GSV laser ablation.  Comparison Study: 08/31/2022 Lower extremity venous reflux Performing Technologist: Gertie Fey MHA, RDMS, RVT, RDCS  Examination Guidelines: A complete evaluation includes B-mode imaging, spectral Doppler, color Doppler, and power Doppler as needed of all accessible portions of each vessel. Bilateral testing is considered an integral part of a complete examination. Limited examinations for  reoccurring indications may be performed as noted. The reflux portion of the exam is performed with the patient in reverse Trendelenburg. Significant venous reflux is defined as >500 ms in the superficial venous system, and >1 second in the deep venous system.  Venous Reflux Times +---------+---------+------+-----------+------------+--------+ RIGHT    Reflux NoRefluxReflux TimeDiameter cmsComments                    Yes                                  +---------+---------+------+-----------+------------+--------+  CFV                                            patent   +---------+---------+------+-----------+------------+--------+ FV prox                                        patent   +---------+---------+------+-----------+------------+--------+ FV mid                                         patent   +---------+---------+------+-----------+------------+--------+ FV dist                                        patent   +---------+---------+------+-----------+------------+--------+ Popliteal                                      patent   +---------+---------+------+-----------+------------+--------+   Summary: Right: - No evidence of deep vein thrombosis seen in the right lower extremity, from the common femoral through the popliteal veins.  - Successful laser ablation of the right great saphenous vein from the distal great saphenous vein to within 0.55cm of the saphenofemoral junction. *See table(s) above for measurements and observations. Electronically signed by Gerarda Fraction on 02/09/2023 at 5:10:50 PM.    Final      ASSESSMENT/PLAN:  This is a very pleasant 77 year old Caucasian male with recurrent non-small cell lung cancer, adenocarcinoma.  He is positive for EGFR mutation and 21.   He was on Tarceva 100 mg by mouth daily status post 101 months of treatment.   At his appointment in September 2024, the patient had restaging CT scan at that time that showed  some progressive disease with progressive right lower lobe nodularity .   Therefore, his treatment was changed to Tagrisso 80 mg p.o. daily.  He started this on 12/28/2022 and thus far has been tolerating it well.     The patient was seen with Dr. Arbutus Ped today.  Dr. Arbutus Ped personally and independently reviewed the scan and discussed results with the patient today.  The scan showed ***.  Dr. Arbutus Ped recommends ***  Labs were reviewed. Recommend he *** on the same treatment at the same dose.   We will see him back in 6 weeks for evaluation and repeat blood work.   The patient was advised to call immediately if she has any concerning symptoms in the interval. The patient voices understanding of current disease status and treatment options and is in agreement with the current care plan. All questions were answered. The patient knows to call the clinic with any problems, questions or concerns. We can certainly see the patient much sooner if necessary      No orders of the defined types were placed in this encounter.    I spent {CHL ONC TIME VISIT - WGNFA:[id number]} counseling the patient face to face. The total time spent in the appointment was {CHL ONC TIME VISIT - ONGEX:[id number]}.  Ameirah Khatoon L Marwin Primmer, PA-C 03/11/23

## 2023-03-13 ENCOUNTER — Other Ambulatory Visit: Payer: Self-pay

## 2023-03-14 ENCOUNTER — Other Ambulatory Visit: Payer: Self-pay

## 2023-03-14 NOTE — Progress Notes (Signed)
Specialty Pharmacy Refill Coordination Note  [name] is a 77 y.o. male contacted today regarding refills of specialty medication(s) Osimertinib Mesylate   Patient requested [name] at [facility] at [place] date: 03/20/23   Medication will be filled on 03/17/23.

## 2023-03-15 ENCOUNTER — Other Ambulatory Visit: Payer: Self-pay

## 2023-03-15 ENCOUNTER — Inpatient Hospital Stay (HOSPITAL_BASED_OUTPATIENT_CLINIC_OR_DEPARTMENT_OTHER): Payer: Medicare Other | Admitting: Physician Assistant

## 2023-03-15 VITALS — BP 170/79 | HR 70 | Temp 98.4°F | Resp 16 | Wt 240.4 lb

## 2023-03-15 DIAGNOSIS — Z79899 Other long term (current) drug therapy: Secondary | ICD-10-CM | POA: Diagnosis not present

## 2023-03-15 DIAGNOSIS — C3411 Malignant neoplasm of upper lobe, right bronchus or lung: Secondary | ICD-10-CM | POA: Diagnosis not present

## 2023-03-15 DIAGNOSIS — C3431 Malignant neoplasm of lower lobe, right bronchus or lung: Secondary | ICD-10-CM | POA: Diagnosis not present

## 2023-03-15 DIAGNOSIS — Z902 Acquired absence of lung [part of]: Secondary | ICD-10-CM | POA: Diagnosis not present

## 2023-03-16 DIAGNOSIS — Z85828 Personal history of other malignant neoplasm of skin: Secondary | ICD-10-CM | POA: Diagnosis not present

## 2023-03-16 DIAGNOSIS — Z08 Encounter for follow-up examination after completed treatment for malignant neoplasm: Secondary | ICD-10-CM | POA: Diagnosis not present

## 2023-03-20 ENCOUNTER — Other Ambulatory Visit (HOSPITAL_COMMUNITY): Payer: Self-pay

## 2023-03-21 DIAGNOSIS — I10 Essential (primary) hypertension: Secondary | ICD-10-CM | POA: Diagnosis not present

## 2023-03-21 DIAGNOSIS — G4730 Sleep apnea, unspecified: Secondary | ICD-10-CM | POA: Diagnosis not present

## 2023-03-21 DIAGNOSIS — E785 Hyperlipidemia, unspecified: Secondary | ICD-10-CM | POA: Diagnosis not present

## 2023-03-21 DIAGNOSIS — E039 Hypothyroidism, unspecified: Secondary | ICD-10-CM | POA: Diagnosis not present

## 2023-03-21 DIAGNOSIS — I700 Atherosclerosis of aorta: Secondary | ICD-10-CM | POA: Diagnosis not present

## 2023-03-21 DIAGNOSIS — C3490 Malignant neoplasm of unspecified part of unspecified bronchus or lung: Secondary | ICD-10-CM | POA: Diagnosis not present

## 2023-03-21 DIAGNOSIS — J439 Emphysema, unspecified: Secondary | ICD-10-CM | POA: Diagnosis not present

## 2023-03-21 DIAGNOSIS — R7303 Prediabetes: Secondary | ICD-10-CM | POA: Diagnosis not present

## 2023-03-23 ENCOUNTER — Telehealth: Payer: Self-pay | Admitting: Pharmacy Technician

## 2023-03-23 ENCOUNTER — Other Ambulatory Visit (HOSPITAL_COMMUNITY): Payer: Self-pay

## 2023-03-23 NOTE — Telephone Encounter (Signed)
Oral Oncology Patient Advocate Encounter  Prior Authorization for [name] has been approved.    PA# [id number] Effective dates: 03/23/23 through 04/03/24  Patients co-pay is $0. Patient's OOP Max will reset to $2,000 in January 2025   Jinger Neighbors, CPhT-Adv Oncology Pharmacy Patient Advocate [facility] Direct Number: [phone]  Fax: [phone]

## 2023-03-23 NOTE — Telephone Encounter (Signed)
Oral Oncology Patient Advocate Encounter   Received notification that prior authorization for Tagrisso is due for renewal.   PA submitted on 03/23/23 Key [id number] Status is pending    Jinger Neighbors, CPhT-Adv Oncology Pharmacy Patient Advocate [facility] Direct Number: [phone]  Fax: [phone]

## 2023-04-06 ENCOUNTER — Other Ambulatory Visit: Payer: Self-pay

## 2023-04-06 ENCOUNTER — Other Ambulatory Visit (HOSPITAL_COMMUNITY): Payer: Self-pay

## 2023-04-06 DIAGNOSIS — I10 Essential (primary) hypertension: Secondary | ICD-10-CM | POA: Diagnosis not present

## 2023-04-10 ENCOUNTER — Other Ambulatory Visit: Payer: Self-pay

## 2023-04-10 ENCOUNTER — Other Ambulatory Visit (HOSPITAL_COMMUNITY): Payer: Self-pay

## 2023-04-10 ENCOUNTER — Other Ambulatory Visit: Payer: Self-pay | Admitting: Internal Medicine

## 2023-04-10 DIAGNOSIS — C3431 Malignant neoplasm of lower lobe, right bronchus or lung: Secondary | ICD-10-CM

## 2023-04-10 MED ORDER — OSIMERTINIB MESYLATE 80 MG PO TABS
80.0000 mg | ORAL_TABLET | Freq: Every day | ORAL | 3 refills | Status: DC
  Filled 2023-04-10 – 2023-04-11 (×2): qty 30, 30d supply, fill #0
  Filled 2023-05-10: qty 30, 30d supply, fill #1
  Filled 2023-06-08: qty 30, 30d supply, fill #2
  Filled 2023-07-06: qty 30, 30d supply, fill #3

## 2023-04-10 NOTE — Progress Notes (Signed)
 Specialty Pharmacy Refill Coordination Note  [name] is a 78 y.o. male contacted today regarding refills of specialty medication(s) Osimertinib  Mesylate (TAGRISSO )   Patient requested [name] at [facility] [place] date: 04/19/23   Medication will be filled on 04/18/23.

## 2023-04-10 NOTE — Progress Notes (Signed)
 Specialty Pharmacy Ongoing Clinical Assessment Note  [name] is a 78 y.o. male who is being followed by the specialty pharmacy service for RxSp Oncology   Patient's specialty medication(s) reviewed today: Osimertinib  Mesylate (TAGRISSO )   Missed doses in the last 4 weeks: 0   Patient/Caregiver did not have any additional questions or concerns.   Therapeutic benefit summary: Patient is achieving benefit   Adverse events/side effects summary: No adverse events/side effects   Patient's therapy is appropriate to: Continue    Goals Addressed             This Visit's Progress    Slow Disease Progression       Patient is on track. Patient will maintain adherence and adhere to provider and/or lab appointments. Per 12/11 visit notes recent restaging scans did not show signs of disease progression.           Follow up:  3 months  [name] Specialty Pharmacist

## 2023-04-10 NOTE — Progress Notes (Signed)
 Pending refill completed.

## 2023-04-11 ENCOUNTER — Other Ambulatory Visit (HOSPITAL_COMMUNITY): Payer: Self-pay

## 2023-04-11 ENCOUNTER — Telehealth: Payer: Self-pay | Admitting: Pharmacy Technician

## 2023-04-11 ENCOUNTER — Other Ambulatory Visit: Payer: Self-pay

## 2023-04-11 NOTE — Telephone Encounter (Signed)
 Oral Oncology Patient Advocate Encounter   Was successful in securing patient a $4,000 grant from [employer] to provide copayment coverage for Tagrisso .  This will keep the out of pocket expense at $0.      The billing information is as follows and has been shared with [facility].   Member ID: [id number] Group ID: [id number]  RxBin: [id number] PCN: [id number] Dates of Eligibility: 04/11/23 through 04/10/24  Fund name:  [employer].   Estefana Moellers, CPhT-Adv Oncology Pharmacy Patient Advocate [facility] Direct Number: [phone]  Fax: [phone]

## 2023-04-13 ENCOUNTER — Ambulatory Visit (HOSPITAL_COMMUNITY)
Admission: RE | Admit: 2023-04-13 | Discharge: 2023-04-13 | Disposition: A | Discharge status: Home / Self Care | Payer: Medicare Other | Source: Ambulatory Visit | Attending: Surgery | Admitting: Surgery

## 2023-04-13 DIAGNOSIS — I83812 Varicose veins of left lower extremities with pain: Secondary | ICD-10-CM | POA: Insufficient documentation

## 2023-04-17 ENCOUNTER — Ambulatory Visit (INDEPENDENT_AMBULATORY_CARE_PROVIDER_SITE_OTHER): Payer: Medicare Other | Admitting: Surgery

## 2023-04-17 ENCOUNTER — Encounter (HOSPITAL_COMMUNITY): Payer: Medicare Other

## 2023-04-17 ENCOUNTER — Encounter: Payer: Self-pay | Admitting: Surgery

## 2023-04-17 VITALS — BP 131/81 | HR 64 | Temp 98.4°F | Resp 20 | Ht 72.0 in | Wt 237.8 lb

## 2023-04-17 DIAGNOSIS — N184 Chronic kidney disease, stage 4 (severe): Secondary | ICD-10-CM

## 2023-04-17 DIAGNOSIS — I83812 Varicose veins of left lower extremities with pain: Secondary | ICD-10-CM | POA: Diagnosis not present

## 2023-04-17 NOTE — Progress Notes (Signed)
 V[facility] Specialist of [place]  Patient name: [name] MRN: [id number] DOB: Dec 01, 1945 Sex: male   REASON FOR VISIT:    Follow up  HISOTRY OF PRESENT ILLNESS:    [name] is a 78 y.o. male who is status post endovenous laser ablation of the right saphenous vein and greater than 20 stabs on 01/26/2023 for CEAP class IV disease he had and excellent result with this.  He is now worried about his left leg.  He has similar symptoms in the left leg which are mainly swelling that gets progressively worse throughout the day.  He has been wearing 20-30 thigh-high compression stockings for at least 3 months.  He tries to be as active as possible.  He keeps his leg elevated when he can.  He denies any history of DVT.   PAST MEDICAL HISTORY:   Past Medical History:  Diagnosis Date   Cholelithiases 07/16/2015   Complication of anesthesia 2007   quit breathing with bronchoscopy, had to spend night   History of agent Orange exposure 44-45 yrs ago   History of pneumonia  last 3-4 yrs ago   3 -4 different times   Hypercholesterolemia    Hypertension    Hypothyroidism    lung ca dx'd 2007   lung right   Sinus disease    hx of   Sleep apnea    uses cpap setting opf 12     FAMILY HISTORY:   Family History  Problem Relation Age of Onset   Hypertension Mother    Varicose Veins Mother    Heart attack Father    Heart attack Brother    Varicose Veins Sister     SOCIAL HISTORY:   Social History   Tobacco Use   Smoking status: Never   Smokeless tobacco: Never  Substance Use Topics   Alcohol use: No    Alcohol/week: 0.0 standard drinks of alcohol     ALLERGIES:   Allergies  Allergen Reactions   Lisinopril Cough   Prednisone Rash   Furosemide  Rash     CURRENT MEDICATIONS:   Current Outpatient Medications  Medication Sig Dispense Refill   amitriptyline (ELAVIL) 50 MG tablet Take 50 mg by mouth at bedtime.      aspirin 81 MG tablet Take 81 mg by mouth every morning.      Cholecalciferol 2000 units CAPS Take 2,000 Units by mouth daily.      clindamycin  (CLEOCIN  T) 1 % external solution APPLY TO AFFECTED AREA(S) TWO TIMES A DAY AS NEEDED FOR SKIN IRRITATION 30 mL 0   cloNIDine (CATAPRES) 0.1 MG tablet Take 0.1 mg by mouth daily.     cloNIDine (CATAPRES) 0.2 MG tablet Take 0.2 mg by mouth daily.     levETIRAcetam (KEPPRA) 750 MG tablet Take 750 mg by mouth at bedtime.      metoprolol  succinate (TOPROL -XL) 50 MG 24 hr tablet Take 1 tablet (50 mg total) by mouth daily. Take with or immediately following a meal. 90 tablet 3   Multiple Vitamins-Minerals (CENTRUM SILVER PO) Take 1 tablet by mouth daily.      nitroGLYCERIN  (NITROSTAT ) 0.4 MG SL tablet DISSOLVE 1 TAB UNDER TONGUE FOR CHEST PAIN - IF PAIN REMAINS AFTER 5 MIN, CALL 911 AND REPEAT DOSE. MAX 3 TABS IN 15 MINUTES 25 tablet 10   osimertinib  mesylate (TAGRISSO ) 80 MG tablet Take 1 tablet (80 mg total) by mouth daily. 30 tablet 3   polyethylene glycol (MIRALAX / GLYCOLAX)  packet Take 17 g by mouth daily. Reported on 08/27/2015     psyllium (METAMUCIL) 58.6 % powder Take 1 packet by mouth daily. Reported on 08/27/2015     rosuvastatin (CRESTOR) 5 MG tablet Take 5 mg by mouth every morning.      SYNTHROID 112 MCG tablet Take 112 mcg by mouth daily.     valsartan-hydrochlorothiazide (DIOVAN-HCT) 320-25 MG per tablet Take 1 tablet by mouth every morning.      No current facility-administered medications for this visit.    REVIEW OF SYSTEMS:   [X]  denotes positive finding, [ ]  denotes negative finding Cardiac  Comments:  Chest pain or chest pressure:    Shortness of breath upon exertion:    Short of breath when lying flat:    Irregular heart rhythm:        Vascular    Pain in calf, thigh, or hip brought on by ambulation:    Pain in feet at night that wakes you up from your sleep:     Blood clot in your veins:    Leg swelling:  xc       Pulmonary     Oxygen at home:    Productive cough:     Wheezing:         Neurologic    Sudden weakness in arms or legs:     Sudden numbness in arms or legs:     Sudden onset of difficulty speaking or slurred speech:    Temporary loss of vision in one eye:     Problems with dizziness:         Gastrointestinal    Blood in stool:     Vomited blood:         Genitourinary    Burning when urinating:     Blood in urine:        Psychiatric    Major depression:         Hematologic    Bleeding problems:    Problems with blood clotting too easily:        Skin    Rashes or ulcers:        Constitutional    Fever or chills:      PHYSICAL EXAM:   Vitals:   04/17/23 1422  BP: 131/81  Pulse: 64  Resp: 20  Temp: 98.4 F (36.9 C)  SpO2: 96%  Weight: 237 lb 12.8 oz (107.9 kg)  Height: 6' (1.829 m)    GENERAL: The patient is a well-nourished male, in no acute distress. The vital signs are documented above. CARDIAC: There is a regular rate and rhythm.  VASCULAR: Saphenous vein is markedly dilated and straight with multiple branches in the upper thigh PULMONARY: Non-labored respirations MUSCULOSKELETAL: There are no major deformities or cyanosis. NEUROLOGIC: No focal weakness or paresthesias are detected. SKIN: There are no ulcers or rashes noted. PSYCHIATRIC: The patient has a normal affect.  STUDIES:   I have reviewed the following ultrasound:  +-------------+---------+------+---------+------------+--------------------  ---+  LEFT        Reflux NoReflux Reflux  Diameter cmsComments                                         Yes    Time                                         +-------------+---------+------+---------+------------+--------------------  ---+  CFV                   yes  >1 second                                      +-------------+---------+------+---------+------------+--------------------  ---+  FV mid       no                                                             +-------------+---------+------+---------+------------+--------------------  ---+  Popliteal   no                                                            +-------------+---------+------+---------+------------+--------------------  ---+  GSV at [facility]   no                          0.69                              +-------------+---------+------+---------+------------+--------------------  ---+  GSV prox               yes   >500 ms     0.91    branch courses  medially  thigh                                                                     +-------------+---------+------+---------+------------+--------------------  ---+  GSV mid thigh          yes   >500 ms     0.64    branches courses                                                           posterior                 +-------------+---------+------+---------+------------+--------------------  ---+  GSV dist     no                          0.49                              thigh                                                                      +-------------+---------+------+---------+------------+--------------------  ---+  GSV at knee            yes   >500 ms     0.44                              +-------------+---------+------+---------+------------+--------------------  ---+  GSV prox calf          yes   >500 ms     0.53                              +-------------+---------+------+---------+------------+--------------------  ---+  SSV Pop [place]                          0.10                              +-------------+---------+------+---------+------------+--------------------  ---+   MEDICAL ISSUES:   CEAP class IV: The patient has problematic left leg swelling that has failed conservative management consisting of exercise, leg elevation, and compression socks.  In addition, he is developing skin  discoloration in the lower leg.  Ultrasound shows significant axial reflux in the great saphenous vein.  I recommended proceeding with laser ablation of the left great saphenous vein.  Risks and benefits of the procedure were discussed with the patient and his wife.  They wish to proceed.    Malvina Serene CLORE, MD, FACS [facility] [phone] Pager [phone]

## 2023-04-18 ENCOUNTER — Other Ambulatory Visit: Payer: Self-pay

## 2023-04-18 ENCOUNTER — Other Ambulatory Visit: Payer: Self-pay | Admitting: *Deleted

## 2023-04-18 DIAGNOSIS — I83812 Varicose veins of left lower extremities with pain: Secondary | ICD-10-CM

## 2023-04-25 ENCOUNTER — Other Ambulatory Visit (HOSPITAL_COMMUNITY): Payer: Self-pay

## 2023-04-27 ENCOUNTER — Other Ambulatory Visit (HOSPITAL_COMMUNITY): Payer: Self-pay

## 2023-04-27 ENCOUNTER — Telehealth: Payer: Self-pay | Admitting: Pharmacy Technician

## 2023-04-27 NOTE — Telephone Encounter (Signed)
Oral Oncology Patient Advocate Encounter  Sent POI to Cancer Care via e-fax [phone] as requested by the program.  Jinger Neighbors, CPhT-Adv Oncology Pharmacy Patient Advocate [facility] Direct Number: [phone]  Fax: [phone]

## 2023-05-09 ENCOUNTER — Other Ambulatory Visit (HOSPITAL_COMMUNITY): Payer: Self-pay

## 2023-05-10 ENCOUNTER — Other Ambulatory Visit: Payer: Self-pay

## 2023-05-10 NOTE — Progress Notes (Signed)
 Specialty Pharmacy Refill Coordination Note  [name] is a 78 y.o. male contacted today regarding refills of specialty medication(s) Osimertinib  Mesylate (TAGRISSO )   Patient requested [name] at [facility] [place] date: 05/17/23  Patient will pick up either 05/17/23 or 05/18/23  Medication will be filled on 05/16/2023.

## 2023-05-18 ENCOUNTER — Telehealth: Payer: Self-pay | Admitting: Internal Medicine

## 2023-05-18 ENCOUNTER — Inpatient Hospital Stay: Payer: Medicare Other | Attending: Internal Medicine

## 2023-05-18 ENCOUNTER — Inpatient Hospital Stay (HOSPITAL_BASED_OUTPATIENT_CLINIC_OR_DEPARTMENT_OTHER): Payer: Medicare Other | Admitting: Internal Medicine

## 2023-05-18 VITALS — BP 169/90 | HR 78 | Temp 97.2°F | Resp 16 | Wt 240.9 lb

## 2023-05-18 DIAGNOSIS — C3490 Malignant neoplasm of unspecified part of unspecified bronchus or lung: Secondary | ICD-10-CM

## 2023-05-18 DIAGNOSIS — Z79899 Other long term (current) drug therapy: Secondary | ICD-10-CM | POA: Insufficient documentation

## 2023-05-18 DIAGNOSIS — C3411 Malignant neoplasm of upper lobe, right bronchus or lung: Secondary | ICD-10-CM | POA: Diagnosis not present

## 2023-05-18 DIAGNOSIS — I10 Essential (primary) hypertension: Secondary | ICD-10-CM | POA: Insufficient documentation

## 2023-05-18 DIAGNOSIS — C3431 Malignant neoplasm of lower lobe, right bronchus or lung: Secondary | ICD-10-CM

## 2023-05-18 LAB — CBC WITH DIFFERENTIAL (CANCER CENTER ONLY)
Abs Immature Granulocytes: 0.01 10*3/uL (ref 0.00–0.07)
Basophils Absolute: 0 10*3/uL (ref 0.0–0.1)
Basophils Relative: 1 %
Eosinophils Absolute: 0.3 10*3/uL (ref 0.0–0.5)
Eosinophils Relative: 6 %
HCT: 32.9 % — ABNORMAL LOW (ref 39.0–52.0)
Hemoglobin: 11.3 g/dL — ABNORMAL LOW (ref 13.0–17.0)
Immature Granulocytes: 0 %
Lymphocytes Relative: 33 %
Lymphs Abs: 1.5 10*3/uL (ref 0.7–4.0)
MCH: 34.3 pg — ABNORMAL HIGH (ref 26.0–34.0)
MCHC: 34.3 g/dL (ref 30.0–36.0)
MCV: 100 fL (ref 80.0–100.0)
Monocytes Absolute: 0.8 10*3/uL (ref 0.1–1.0)
Monocytes Relative: 17 %
Neutro Abs: 1.8 10*3/uL (ref 1.7–7.7)
Neutrophils Relative %: 43 %
Platelet Count: 115 10*3/uL — ABNORMAL LOW (ref 150–400)
RBC: 3.29 MIL/uL — ABNORMAL LOW (ref 4.22–5.81)
RDW: 12.6 % (ref 11.5–15.5)
WBC Count: 4.3 10*3/uL (ref 4.0–10.5)
nRBC: 0 % (ref 0.0–0.2)

## 2023-05-18 LAB — CMP (CANCER CENTER ONLY)
ALT: 35 U/L (ref 0–44)
AST: 35 U/L (ref 15–41)
Albumin: 4 g/dL (ref 3.5–5.0)
Alkaline Phosphatase: 88 U/L (ref 38–126)
Anion gap: 4 — ABNORMAL LOW (ref 5–15)
BUN: 18 mg/dL (ref 8–23)
CO2: 31 mmol/L (ref 22–32)
Calcium: 9.4 mg/dL (ref 8.9–10.3)
Chloride: 102 mmol/L (ref 98–111)
Creatinine: 0.93 mg/dL (ref 0.61–1.24)
GFR, Estimated: >60 mL/min (ref >60)
Glucose, Bld: 99 mg/dL (ref 70–99)
Potassium: 3.8 mmol/L (ref 3.5–5.1)
Sodium: 137 mmol/L (ref 135–145)
Total Bilirubin: 0.4 mg/dL (ref 0.0–1.2)
Total Protein: 6.4 g/dL — ABNORMAL LOW (ref 6.5–8.1)

## 2023-05-18 NOTE — Progress Notes (Signed)
[facility] [phone]   Fax:([phone]  OFFICE PROGRESS NOTE  Andrew Housekeeper, MD 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  PRIOR THERAPY: 1) Status post right upper lobectomy under the care of Dr. Dorris Fetch on 01/23/2006.  2) Tarceva 150 mg by mouth daily as part of the BMS checkmate 370 clinical trial. Status post 6 weeks of treatment. 3) Tarceva 100 mg by mouth daily as part of the BMS Checkmate 370 clinical trial. First dose started 07/17/2014 status post 101 months of treatment  CURRENT THERAPY: Tagrisso 80 mg p.o. daily started 12/28/2022.  INTERVAL HISTORY: [name] 78 y.o. male returns to the clinic today for follow-up visit. Discussed the use of AI scribe software for clinical note transcription with the patient, who gave verbal consent to proceed.  History of Present Illness   [name] is a 78 year old male with non-small cell lung cancer who presents for follow-up on treatment with Tagrisso.  He was initially diagnosed with stage 1A non-small cell lung cancer in October 2007 and underwent a right upper lobectomy. A recurrence occurred a year or two later, leading to treatment with Tarceva. Due to disease progression, his treatment was switched to [facility] on December 28, 2022.  Since starting Tagrisso, he feels 'a little slower' compared to when he was on Tarceva, with increased difficulty walking due to fatigue and shortness of breath. No cough or chest pain, but he has persistent right-sided pain. He reports mild anemia and low platelets, known side effects of Tagrisso.  He monitors his blood pressure at home, which was 127/78 this morning, although it was elevated during today's visit. He is on blood pressure medication.  He has gained  approximately two pounds since his last visit and attributes some of his decreased physical activity to the cold weather.         MEDICAL HISTORY: Past Medical History:  Diagnosis Date   Cholelithiases 07/16/2015   Complication of anesthesia 2007   quit breathing with bronchoscopy, had to spend night   History of agent Orange exposure 44-45 yrs ago   History of pneumonia  last 3-4 yrs ago   3 -4 different times   Hypercholesterolemia    Hypertension    Hypothyroidism    lung ca dx'd 2007   lung right   Sinus disease    hx of   Sleep apnea    uses cpap setting opf 12    ALLERGIES:  is allergic to lisinopril, prednisone, and furosemide.  MEDICATIONS:  Current Outpatient Medications  Medication Sig Dispense Refill   amitriptyline (ELAVIL) 50 MG tablet Take 50 mg by mouth at bedtime.     aspirin 81 MG tablet Take 81 mg by mouth every morning.      Cholecalciferol 2000 units CAPS Take 2,000 Units by mouth daily.      clindamycin (CLEOCIN T) 1 % external solution APPLY TO AFFECTED AREA(S) TWO TIMES A DAY AS NEEDED FOR SKIN IRRITATION 30 mL 0   cloNIDine (CATAPRES) 0.1 MG tablet Take 0.1 mg by mouth daily.     cloNIDine (CATAPRES) 0.2 MG tablet Take 0.2 mg by mouth daily.     levETIRAcetam (KEPPRA) 750 MG tablet Take 750 mg by mouth at bedtime.      metoprolol succinate (  TOPROL-XL) 50 MG 24 hr tablet Take 1 tablet (50 mg total) by mouth daily. Take with or immediately following a meal. 90 tablet 3   Multiple Vitamins-Minerals (CENTRUM SILVER PO) Take 1 tablet by mouth daily.      nitroGLYCERIN (NITROSTAT) 0.4 MG SL tablet DISSOLVE 1 TAB UNDER TONGUE FOR CHEST PAIN - IF PAIN REMAINS AFTER 5 MIN, CALL 911 AND REPEAT DOSE. MAX 3 TABS IN 15 MINUTES 25 tablet 10   osimertinib mesylate (TAGRISSO) 80 MG tablet Take 1 tablet (80 mg total) by mouth daily. 30 tablet 3   polyethylene glycol (MIRALAX / GLYCOLAX) packet Take 17 g by mouth daily. Reported on 08/27/2015     psyllium (METAMUCIL)  58.6 % powder Take 1 packet by mouth daily. Reported on 08/27/2015     rosuvastatin (CRESTOR) 5 MG tablet Take 5 mg by mouth every morning.      SYNTHROID 112 MCG tablet Take 112 mcg by mouth daily.     valsartan-hydrochlorothiazide (DIOVAN-HCT) 320-25 MG per tablet Take 1 tablet by mouth every morning.      No current facility-administered medications for this visit.    SURGICAL HISTORY:  Past Surgical History:  Procedure Laterality Date   BIOPSY  05/21/2018   Procedure: BIOPSY;  Surgeon: Graylin Shiver, MD;  Location: [facility];  Service: Endoscopy;;   BIOPSY  05/11/2021   Procedure: BIOPSY;  Surgeon: Kathi Der, MD;  Location: [facility];  Service: Gastroenterology;;   COLONOSCOPY WITH PROPOFOL N/A 03/19/2013   Procedure: COLONOSCOPY WITH PROPOFOL;  Surgeon: Charolett Bumpers, MD;  Location: [facility];  Service: Endoscopy;  Laterality: N/A;   COLONOSCOPY WITH PROPOFOL N/A 05/11/2021   Procedure: COLONOSCOPY WITH PROPOFOL;  Surgeon: Kathi Der, MD;  Location: [facility];  Service: Gastroenterology;  Laterality: N/A;   Cystourethroscopy, [facility] TURP.  04/16/2010   ESOPHAGOGASTRODUODENOSCOPY (EGD) WITH PROPOFOL N/A 05/21/2018   Procedure: ESOPHAGOGASTRODUODENOSCOPY (EGD) WITH PROPOFOL;  Surgeon: Graylin Shiver, MD;  Location: [facility];  Service: Endoscopy;  Laterality: N/A;   LASER ABLATION Right 01/26/2023   Laser ablation of right greater saphenous vein with >20 stab phlebectomies   NASAL SINUS SURGERY  04/04/1985   POLYPECTOMY  05/11/2021   Procedure: POLYPECTOMY;  Surgeon: Kathi Der, MD;  Location: [facility];  Service: Gastroenterology;;   Right video-assisted thoracoscopy, right upper lobectomy and  01/23/2006   The Olympus video bronchoscope was introduced via the right  12/21/2005   TONSILLECTOMY  04/05/1955   Torn medial and lateral menisci, left knee.  11/06/2006    REVIEW OF SYSTEMS:  A comprehensive review of systems was negative except  for: Constitutional: positive for fatigue Respiratory: positive for dyspnea on exertion   PHYSICAL EXAMINATION: General appearance: alert, cooperative, fatigued, and no distress Head: Normocephalic, without obvious abnormality, atraumatic Neck: no adenopathy, no JVD, supple, symmetrical, trachea midline, and thyroid not enlarged, symmetric, no tenderness/mass/nodules Lymph nodes: Cervical, supraclavicular, and axillary nodes normal. Resp: clear to auscultation bilaterally Back: symmetric, no curvature. ROM normal. No CVA tenderness. Cardio: regular rate and rhythm, S1, S2 normal, no murmur, click, rub or gallop GI: soft, non-tender; bowel sounds normal; no masses,  no organomegaly Extremities: extremities normal, atraumatic, no cyanosis or edema  ECOG PERFORMANCE STATUS: 1 - Symptomatic but completely ambulatory  Blood pressure (!) 169/90, pulse 78, temperature (!) 97.2 F (36.2 C), temperature source Temporal, resp. rate 16, weight 240 lb 14.4 oz (109.3 kg), SpO2 98%.  LABORATORY DATA: Lab Results  Component Value Date   WBC 4.3 05/18/2023  HGB 11.3 (L) 05/18/2023   HCT 32.9 (L) 05/18/2023   MCV 100.0 05/18/2023   PLT 115 (L) 05/18/2023      Chemistry      Component Value Date/Time   NA 137 05/18/2023 0845   NA 141 03/11/2019 0959   NA 139 04/06/2017 0932   K 3.8 05/18/2023 0845   K 3.8 04/06/2017 0932   CL 102 05/18/2023 0845   CL 102 04/24/2012 0853   CO2 31 05/18/2023 0845   CO2 29 04/06/2017 0932   BUN 18 05/18/2023 0845   BUN 18 03/11/2019 0959   BUN 14.4 04/06/2017 0932   CREATININE 0.93 05/18/2023 0845   CREATININE 1.0 04/06/2017 0932      Component Value Date/Time   CALCIUM 9.4 05/18/2023 0845   CALCIUM 9.5 04/06/2017 0932   ALKPHOS 88 05/18/2023 0845   ALKPHOS 58 04/06/2017 0932   AST 35 05/18/2023 0845   AST 27 04/06/2017 0932   ALT 35 05/18/2023 0845   ALT 27 04/06/2017 0932   BILITOT 0.4 05/18/2023 0845   BILITOT 0.45 04/06/2017 0932        RADIOGRAPHIC STUDIES: No results found.   ASSESSMENT AND PLAN:  This is a very pleasant 78 years old white male with recurrent non-small cell lung cancer, adenocarcinoma and positive EGFR mutation in exon 21. He is currently on Tarceva 100 mg by mouth daily status post around 101 months of treatment. The patient had evidence for disease progression and he is switching to treatment with Tagrisso 80 mg p.o. daily on December 28, 2022.  He has been tolerating this treatment fairly well.     Non-Small Cell Lung Cancer (NSCLC) Stage 1A NSCLC initially diagnosed in October 2007. Underwent right upper lobectomy. Recurrence noted a year or two later. Initially treated with Tarceva, switched to Tagrisso on December 28, 2022, due to disease progression. Current symptoms include increased dyspnea and fatigue, likely related to Tagrisso. Last scan was stable. Mild anemia and thrombocytopenia likely secondary to Tagrisso. Discussed the importance of continuing Tagrisso despite side effects due to its efficacy in controlling disease progression. Encouraged regular exercise to improve symptoms. - Continue Tagrisso - Schedule follow-up scan one week to ten days before next visit - Follow-up visit in two months - Encourage regular exercise to improve symptoms  Hypertension Elevated blood pressure noted in clinic, but he reports normal readings at home (127/78). - Continue current antihypertensive medication - Monitor blood pressure at home  General Health Maintenance Mild weight gain noted. Encouraged to maintain physical activity despite cold weather. - Encourage regular exercise.   He was advised to call immediately if he has any concerning symptoms in the interval. The patient voices understanding of current disease status and treatment options and is in agreement with the current care plan. All questions were answered. The patient knows to call the clinic with any problems, questions or  concerns. We can certainly see the patient much sooner if necessary. The total time spent in the appointment was 20 minutes.  Disclaimer: This note was dictated with voice recognition software. Similar sounding words can inadvertently be transcribed and may not be corrected upon review.

## 2023-05-18 NOTE — Telephone Encounter (Signed)
Rescheduled appointment per patient request. Patient is aware of the made appointment.

## 2023-05-29 ENCOUNTER — Other Ambulatory Visit: Payer: Self-pay

## 2023-05-31 ENCOUNTER — Encounter: Payer: Self-pay | Admitting: Podiatry

## 2023-05-31 ENCOUNTER — Ambulatory Visit (INDEPENDENT_AMBULATORY_CARE_PROVIDER_SITE_OTHER): Payer: Medicare Other | Admitting: Podiatry

## 2023-05-31 DIAGNOSIS — B351 Tinea unguium: Secondary | ICD-10-CM

## 2023-05-31 NOTE — Progress Notes (Signed)
  Subjective:  Patient ID: [name], male    DOB: 1946-01-29,   MRN: [id number]  No chief complaint on file.   78 y.o. male presents for concern of bilateral toenail changes that he has been dealing with for years.  He has seen a couple doctors for this and has tried multiple treatments but does not recall what. Relates nothing really helped. Does relate a history of wearing steel toes and has lost his nails in the past. . Denies any other pedal complaints. Denies n/v/f/c.   Past Medical History:  Diagnosis Date   Cholelithiases 07/16/2015   Complication of anesthesia 2007   quit breathing with bronchoscopy, had to spend night   History of agent Orange exposure 44-45 yrs ago   History of pneumonia  last 3-4 yrs ago   3 -4 different times   Hypercholesterolemia    Hypertension    Hypothyroidism    lung ca dx'd 2007   lung right   Sinus disease    hx of   Sleep apnea    uses cpap setting opf 12    Objective:  Physical Exam: Vascular: DP/PT pulses 2/4 bilateral. CFT <3 seconds. Normal hair growth on digits. No edema.  Skin. No lacerations or abrasions bilateral feet. Bilateral nails 1-5 except right fourth are thickened dystrophic and discolored with subungual debris Musculoskeletal: MMT 5/5 bilateral lower extremities in DF, PF, Inversion and Eversion. Deceased ROM in DF of ankle joint.  Neurological: Sensation intact to light touch.   Assessment:   1. Onychomycosis      Plan:  Patient was evaluated and treated and all questions answered. -Examined patient -Discussed treatment options for painful dystrophic nails  -Clinical picture and Fungal culture was obtained by removing a portion of the hard nail itself from each of the involved toenails using a sterile nail nipper and sent to [facility] lab. Patient tolerated the biopsy procedure well without discomfort or need for anesthesia.  -Discussed fungal nail treatment options including oral, topical, and laser treatments.   -Patient to return in 4 weeks for follow up evaluation and discussion of fungal culture results or sooner if symptoms worsen.   Louann Sjogren, DPM

## 2023-05-31 NOTE — Addendum Note (Signed)
 Addended by: Daryel November on: 05/31/2023 04:58 PM   Modules accepted: Orders

## 2023-06-06 ENCOUNTER — Other Ambulatory Visit: Payer: Self-pay | Admitting: *Deleted

## 2023-06-06 MED ORDER — LORAZEPAM 1 MG PO TABS: ORAL_TABLET | ORAL | 0 refills | Status: DC

## 2023-06-08 ENCOUNTER — Other Ambulatory Visit: Payer: Self-pay | Admitting: Pharmacy Technician

## 2023-06-08 ENCOUNTER — Other Ambulatory Visit: Payer: Self-pay

## 2023-06-08 NOTE — Progress Notes (Signed)
 Specialty Pharmacy Refill Coordination Note  [name] is a 78 y.o. male contacted today regarding refills of specialty medication(s) Osimertinib Mesylate [name])   Patient requested (Patient-Rptd) Pickup at [facility] date: (Patient-Rptd) 06/14/23   Medication will be filled on 06/13/23.

## 2023-06-12 ENCOUNTER — Other Ambulatory Visit: Payer: Self-pay | Admitting: Podiatry

## 2023-06-12 DIAGNOSIS — I700 Atherosclerosis of aorta: Secondary | ICD-10-CM | POA: Diagnosis not present

## 2023-06-12 DIAGNOSIS — I10 Essential (primary) hypertension: Secondary | ICD-10-CM | POA: Diagnosis not present

## 2023-06-12 DIAGNOSIS — J439 Emphysema, unspecified: Secondary | ICD-10-CM | POA: Diagnosis not present

## 2023-06-15 ENCOUNTER — Encounter: Payer: Self-pay | Admitting: Surgery

## 2023-06-15 ENCOUNTER — Ambulatory Visit (INDEPENDENT_AMBULATORY_CARE_PROVIDER_SITE_OTHER): Payer: Medicare Other | Admitting: Surgery

## 2023-06-15 VITALS — BP 134/79 | HR 61 | Temp 97.7°F | Resp 18 | Ht 72.0 in | Wt 238.0 lb

## 2023-06-15 DIAGNOSIS — I83812 Varicose veins of left lower extremities with pain: Secondary | ICD-10-CM

## 2023-06-15 NOTE — Progress Notes (Signed)
     Laser Ablation Procedure    Date: 06/15/2023  [name] DOB:January 07, 1946 Consent signed: Yes     Surgeon: Dr. Coral Else  Procedure: Laser Ablation: left Greater Saphenous Vein BP 134/79 (BP Location: Left Arm, Patient Position: Sitting, Cuff Size: Large)   Pulse 61   Temp 97.7 F (36.5 C) (Temporal)   Resp 18   Ht 6' (1.829 m)   Wt 238 lb (108 kg)   SpO2 100%   BMI 32.28 kg/m   Tumescent Anesthesia: 250 cc 0.9% NaCl with 50 cc Lidocaine HCL 1%  and 15 cc 8.4% NaHCO3  Local Anesthesia: 3 cc Lidocaine HCL and NaHCO3 (ratio 2:1)  7 watts continuous mode    Total energy: 2104.5 joules    Total time: 300 seconds Treatment Length: 42 cm   Laser Fiber Ref. #  [id number]     Lot # [id number]  Patient tolerated procedure well Notes:  All staff members wore facial masks. Pt had 1 mg Ativan at 07:30 am and another 1 mg AT 07:50 AM. Making a total of 2 mg prior to surgical procedure.   Description of Procedure:  After marking the course of the secondary varicosities, the patient was placed on the operating table in the supine position, and the left leg was prepped and draped in sterile fashion.   Local anesthetic was administered and under ultrasound guidance the saphenous vein was accessed with a micro needle and guide wire; then the mirco puncture sheath was placed.  A guide wire was inserted saphenofemoral junction , followed by a 5 french sheath.  The position of the sheath and then the laser fiber below the junction was confirmed using the ultrasound.  Tumescent anesthesia was administered along the course of the saphenous vein using ultrasound guidance. The patient was placed in Trendelenburg position and protective laser glasses were placed on patient and staff, and the laser was fired at 7 watts continuous mode for a total of 2104.5 joules.  Steri strips were applied to the insertion site and ABD pads and thigh high compression stockings were applied to the top of the  saphenofemoral junction. Blood loss was less than 15 cc.  Discharge instructions reviewed with patient and hardcopy of discharge instructions given to patient to take home. The patient ambulated out of the operating room having tolerated the procedure well.

## 2023-06-28 ENCOUNTER — Ambulatory Visit (INDEPENDENT_AMBULATORY_CARE_PROVIDER_SITE_OTHER): Payer: Medicare Other | Admitting: Podiatry

## 2023-06-28 ENCOUNTER — Telehealth: Payer: Self-pay | Admitting: Medical Oncology

## 2023-06-28 ENCOUNTER — Encounter: Payer: Self-pay | Admitting: Podiatry

## 2023-06-28 DIAGNOSIS — B351 Tinea unguium: Secondary | ICD-10-CM | POA: Diagnosis not present

## 2023-06-28 MED ORDER — FLUCONAZOLE 150 MG PO TABS: 150.0000 mg | ORAL_TABLET | ORAL | 0 refills | Status: AC

## 2023-06-28 NOTE — Progress Notes (Signed)
  Subjective:  Patient ID: [name], male    DOB: Apr 09, 1945,   MRN: [id number]  Chief Complaint  Patient presents with   Nail Problem    Patient presents for a follow up evaluation and discussion of fungal culture results     78 y.o. male presents for follow-up of fungal nails and to discuss cultures  . Denies any other pedal complaints. Denies n/v/f/c.   Past Medical History:  Diagnosis Date   Cholelithiases 07/16/2015   Complication of anesthesia 2007   quit breathing with bronchoscopy, had to spend night   History of agent Orange exposure 44-45 yrs ago   History of pneumonia  last 3-4 yrs ago   3 -4 different times   Hypercholesterolemia    Hypertension    Hypothyroidism    lung ca dx'd 2007   lung right   Sinus disease    hx of   Sleep apnea    uses cpap setting opf 12    Objective:  Physical Exam: Vascular: DP/PT pulses 2/4 bilateral. CFT <3 seconds. Normal hair growth on digits. No edema.  Skin. No lacerations or abrasions bilateral feet. Bilateral nails 1-5 except right fourth are thickened dystrophic and discolored with subungual debris Musculoskeletal: MMT 5/5 bilateral lower extremities in DF, PF, Inversion and Eversion. Deceased ROM in DF of ankle joint.  Neurological: Sensation intact to light touch.   Assessment:   1. Onychomycosis       Plan:  Patient was evaluated and treated and all questions answered. -Examined patient -Discussed treatment options for painful dystrophic nails  -Culture positive for candida infection.  -Discussed fungal nail treatment options including oral, topical, and laser treatments.  -Would like to procced with oral fluconazole weekly for four weeks.  -Patient to return in 1 month for recheck.    Louann Sjogren, DPM

## 2023-06-28 NOTE — Telephone Encounter (Signed)
 Can he take diflucan with tagrisso ? Per Pharmacy , I told Martavius that the dose instructions are take fluconazole once a week x 4 weeks and he should be ok. he should be OK

## 2023-06-28 NOTE — Telephone Encounter (Signed)
 Asking if ok to take diflucan and amlodipine with Tagrisso.

## 2023-06-29 ENCOUNTER — Ambulatory Visit (INDEPENDENT_AMBULATORY_CARE_PROVIDER_SITE_OTHER): Payer: Medicare Other | Admitting: Surgery

## 2023-06-29 ENCOUNTER — Encounter: Payer: Self-pay | Admitting: Surgery

## 2023-06-29 ENCOUNTER — Ambulatory Visit (HOSPITAL_COMMUNITY)
Admission: RE | Admit: 2023-06-29 | Discharge: 2023-06-29 | Disposition: A | Discharge status: Home / Self Care | Payer: Medicare Other | Source: Ambulatory Visit | Attending: Vascular Surgery | Admitting: Vascular Surgery

## 2023-06-29 VITALS — BP 133/77 | HR 62 | Temp 98.0°F

## 2023-06-29 DIAGNOSIS — I83812 Varicose veins of left lower extremities with pain: Secondary | ICD-10-CM | POA: Insufficient documentation

## 2023-06-29 NOTE — Progress Notes (Signed)
 Vascular and Vein Specialist of   Patient name: [name] MRN: [id number] DOB: 20-Feb-1946 Sex: male   REASON FOR VISIT:    Follow up  HISOTRY OF PRESENT ILLNESS:   [name] is a 78 y.o. male who is status post endovenous laser ablation of the right saphenous vein and greater than 20 stabs on 01/26/2023 for CEAP class IV disease he had an excellent result with this.  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  He underwent left greater saphenous vein laser ablation on 06/15/2023.  He is back today for follow up.  He has no complaints.   He has A history of lung cancer since 2007.  He is getting ongoing evaluation by Dr. Arbutus Ped and Dr. Dorris Fetch.  He has undergone lobectomy in the past.  He is currently on Tarceva.    PAST MEDICAL HISTORY:   Past Medical History:  Diagnosis Date   Cholelithiases 07/16/2015   Complication of anesthesia 2007   quit breathing with bronchoscopy, had to spend night   History of agent Orange exposure 44-45 yrs ago   History of pneumonia  last 3-4 yrs ago   3 -4 different times   Hypercholesterolemia    Hypertension    Hypothyroidism    lung ca dx'd 2007   lung right   Sinus disease    hx of   Sleep apnea    uses cpap setting opf 12     FAMILY HISTORY:   Family History  Problem Relation Age of Onset   Hypertension Mother    Varicose Veins Mother    Heart attack Father    Heart attack Brother    Varicose Veins Sister     SOCIAL HISTORY:   Social History   Tobacco Use   Smoking status: Never   Smokeless tobacco: Never  Substance Use Topics   Alcohol use: No    Alcohol/week: 0.0 standard drinks of alcohol     ALLERGIES:   Allergies  Allergen Reactions   Lisinopril Cough   Prednisone Rash   Furosemide Rash     CURRENT MEDICATIONS:   Current Outpatient Medications  Medication Sig Dispense Refill   amitriptyline (ELAVIL) 50 MG tablet Take 50 mg by mouth  at bedtime.     aspirin 81 MG tablet Take 81 mg by mouth every morning.      Cholecalciferol 2000 units CAPS Take 2,000 Units by mouth daily.      clindamycin (CLEOCIN T) 1 % external solution APPLY TO AFFECTED AREA(S) TWO TIMES A DAY AS NEEDED FOR SKIN IRRITATION 30 mL 0   cloNIDine (CATAPRES) 0.1 MG tablet Take 0.1 mg by mouth daily.     cloNIDine (CATAPRES) 0.2 MG tablet Take 0.2 mg by mouth daily.     fluconazole (DIFLUCAN) 150 MG tablet Take 1 tablet (150 mg total) by mouth once a week for 4 doses. 4 tablet 0   levETIRAcetam (KEPPRA) 750 MG tablet Take 750 mg by mouth at bedtime.      metoprolol succinate (TOPROL-XL) 50 MG 24 hr tablet Take 1 tablet (50 mg total) by mouth daily. Take with or immediately following a meal. 90 tablet 3   Multiple Vitamins-Minerals (CENTRUM SILVER PO) Take 1 tablet by mouth daily.      nitroGLYCERIN (NITROSTAT) 0.4 MG SL tablet DISSOLVE 1 TAB UNDER TONGUE FOR CHEST PAIN - IF PAIN REMAINS AFTER 5 MIN, CALL 911 AND REPEAT DOSE. MAX 3 TABS IN 15 MINUTES 25  tablet 10   osimertinib mesylate (TAGRISSO) 80 MG tablet Take 1 tablet (80 mg total) by mouth daily. 30 tablet 3   polyethylene glycol (MIRALAX / GLYCOLAX) packet Take 17 g by mouth daily. Reported on 08/27/2015     psyllium (METAMUCIL) 58.6 % powder Take 1 packet by mouth daily. Reported on 08/27/2015     rosuvastatin (CRESTOR) 5 MG tablet Take 5 mg by mouth every morning.      SYNTHROID 112 MCG tablet Take 112 mcg by mouth daily.     valsartan-hydrochlorothiazide (DIOVAN-HCT) 320-25 MG per tablet Take 1 tablet by mouth every morning.      No current facility-administered medications for this visit.    REVIEW OF SYSTEMS:   [X]  denotes positive finding, [ ]  denotes negative finding Cardiac  Comments:  Chest pain or chest pressure:    Shortness of breath upon exertion:    Short of breath when lying flat:    Irregular heart rhythm:        Vascular    Pain in calf, thigh, or hip brought on by ambulation:     Pain in feet at night that wakes you up from your sleep:     Blood clot in your veins:    Leg swelling:         Pulmonary    Oxygen at home:    Productive cough:     Wheezing:         Neurologic    Sudden weakness in arms or legs:     Sudden numbness in arms or legs:     Sudden onset of difficulty speaking or slurred speech:    Temporary loss of vision in one eye:     Problems with dizziness:         Gastrointestinal    Blood in stool:     Vomited blood:         Genitourinary    Burning when urinating:     Blood in urine:        Psychiatric    Major depression:         Hematologic    Bleeding problems:    Problems with blood clotting too easily:        Skin    Rashes or ulcers:        Constitutional    Fever or chills:      PHYSICAL EXAM:   Vitals:   06/29/23 1031  BP: 133/77  Pulse: 62  Temp: 98 F (36.7 C)  TempSrc: Temporal  SpO2: 97%    GENERAL: The patient is a well-nourished male, in no acute distress. The vital signs are documented above. CARDIAC: There is a regular rate and rhythm.  VASCULAR: Access site without complication.  He does have a more prominent varicosity going around to the posterior side of his leg PULMONARY: Non-labored respirations ABDOMEN: Soft and non-tender with normal pitched bowel sounds.  MUSCULOSKELETAL: There are no major deformities or cyanosis. NEUROLOGIC: No focal weakness or paresthesias are detected. SKIN: There are no ulcers or rashes noted. PSYCHIATRIC: The patient has a normal affect.  STUDIES:   I have reviewed the following duplex: Left:  - No evidence of deep vein thrombosis from the common femoral through the  popliteal veins.  - Successful ablation of the great saphenous vein from the knee up to 1.30  cm from the SFJ.   MEDICAL ISSUES:   Successful laser ablation of the left great saphenous vein.  He does have  a varicosity going around to the posterior aspect of his popliteal fossa.  This is much  more prominent that it was on his initial evaluation.  It is not bothering him now but I encouraged him to reach out to me should he have issues with this in the future.  It would be amenable to stab phlebectomy.    Charlena Cross, MD, FACS [facility] [phone] Pager [phone]

## 2023-07-03 DIAGNOSIS — I700 Atherosclerosis of aorta: Secondary | ICD-10-CM | POA: Diagnosis not present

## 2023-07-03 DIAGNOSIS — C3490 Malignant neoplasm of unspecified part of unspecified bronchus or lung: Secondary | ICD-10-CM | POA: Diagnosis not present

## 2023-07-03 DIAGNOSIS — I10 Essential (primary) hypertension: Secondary | ICD-10-CM | POA: Diagnosis not present

## 2023-07-03 DIAGNOSIS — J439 Emphysema, unspecified: Secondary | ICD-10-CM | POA: Diagnosis not present

## 2023-07-03 DIAGNOSIS — E785 Hyperlipidemia, unspecified: Secondary | ICD-10-CM | POA: Diagnosis not present

## 2023-07-03 DIAGNOSIS — N400 Enlarged prostate without lower urinary tract symptoms: Secondary | ICD-10-CM | POA: Diagnosis not present

## 2023-07-04 ENCOUNTER — Other Ambulatory Visit: Payer: Self-pay

## 2023-07-06 ENCOUNTER — Other Ambulatory Visit: Payer: Self-pay

## 2023-07-06 NOTE — Progress Notes (Signed)
 Specialty Pharmacy Refill Coordination Note  [name] is a 78 y.o. male contacted today regarding refills of specialty medication(s) Osimertinib Mesylate [name])   Patient requested [name] at [facility] at [place] date: 07/18/23   Medication will be filled on 07/17/23.

## 2023-07-06 NOTE — Progress Notes (Signed)
 Specialty Pharmacy Ongoing Clinical Assessment Note  [name] is a 78 y.o. male who is being followed by the specialty pharmacy service for RxSp Oncology   Patient's specialty medication(s) reviewed today: Osimertinib Mesylate (TAGRISSO)   Missed doses in the last 4 weeks: 0   Patient/Caregiver did not have any additional questions or concerns.   Therapeutic benefit summary: Patient is achieving benefit   Adverse events/side effects summary: No adverse events/side effects   Patient's therapy is appropriate to: Continue    Goals Addressed             This Visit's Progress    Slow Disease Progression       Patient is on track. Patient will maintain adherence. Per 2/13 visit notes patient will have follow up scans before April visit.           Follow up:  3 months  Otto Herb Specialty Pharmacist

## 2023-07-11 DIAGNOSIS — J439 Emphysema, unspecified: Secondary | ICD-10-CM | POA: Diagnosis not present

## 2023-07-11 DIAGNOSIS — I700 Atherosclerosis of aorta: Secondary | ICD-10-CM | POA: Diagnosis not present

## 2023-07-11 DIAGNOSIS — I10 Essential (primary) hypertension: Secondary | ICD-10-CM | POA: Diagnosis not present

## 2023-07-17 ENCOUNTER — Other Ambulatory Visit: Payer: Self-pay

## 2023-07-17 ENCOUNTER — Inpatient Hospital Stay: Payer: Medicare Other | Attending: Internal Medicine

## 2023-07-17 DIAGNOSIS — C3490 Malignant neoplasm of unspecified part of unspecified bronchus or lung: Secondary | ICD-10-CM | POA: Insufficient documentation

## 2023-07-17 DIAGNOSIS — N400 Enlarged prostate without lower urinary tract symptoms: Secondary | ICD-10-CM | POA: Insufficient documentation

## 2023-07-17 DIAGNOSIS — D649 Anemia, unspecified: Secondary | ICD-10-CM | POA: Insufficient documentation

## 2023-07-17 LAB — CMP (CANCER CENTER ONLY)
ALT: 27 U/L (ref 0–44)
AST: 29 U/L (ref 15–41)
Albumin: 4.3 g/dL (ref 3.5–5.0)
Alkaline Phosphatase: 81 U/L (ref 38–126)
Anion gap: 6 (ref 5–15)
BUN: 20 mg/dL (ref 8–23)
CO2: 30 mmol/L (ref 22–32)
Calcium: 9.7 mg/dL (ref 8.9–10.3)
Chloride: 100 mmol/L (ref 98–111)
Creatinine: 1.09 mg/dL (ref 0.61–1.24)
GFR, Estimated: >60 mL/min (ref >60)
Glucose, Bld: 96 mg/dL (ref 70–99)
Potassium: 3.8 mmol/L (ref 3.5–5.1)
Sodium: 136 mmol/L (ref 135–145)
Total Bilirubin: 0.4 mg/dL (ref 0.0–1.2)
Total Protein: 6.8 g/dL (ref 6.5–8.1)

## 2023-07-17 LAB — CBC WITH DIFFERENTIAL (CANCER CENTER ONLY)
Abs Immature Granulocytes: 0.01 10*3/uL (ref 0.00–0.07)
Basophils Absolute: 0 10*3/uL (ref 0.0–0.1)
Basophils Relative: 1 %
Eosinophils Absolute: 0.2 10*3/uL (ref 0.0–0.5)
Eosinophils Relative: 5 %
HCT: 34.1 % — ABNORMAL LOW (ref 39.0–52.0)
Hemoglobin: 11.9 g/dL — ABNORMAL LOW (ref 13.0–17.0)
Immature Granulocytes: 0 %
Lymphocytes Relative: 41 %
Lymphs Abs: 1.9 10*3/uL (ref 0.7–4.0)
MCH: 34.5 pg — ABNORMAL HIGH (ref 26.0–34.0)
MCHC: 34.9 g/dL (ref 30.0–36.0)
MCV: 98.8 fL (ref 80.0–100.0)
Monocytes Absolute: 0.7 10*3/uL (ref 0.1–1.0)
Monocytes Relative: 15 %
Neutro Abs: 1.8 10*3/uL (ref 1.7–7.7)
Neutrophils Relative %: 38 %
Platelet Count: 105 10*3/uL — ABNORMAL LOW (ref 150–400)
RBC: 3.45 MIL/uL — ABNORMAL LOW (ref 4.22–5.81)
RDW: 12.7 % (ref 11.5–15.5)
WBC Count: 4.7 10*3/uL (ref 4.0–10.5)
nRBC: 0 % (ref 0.0–0.2)

## 2023-07-18 ENCOUNTER — Encounter (HOSPITAL_COMMUNITY): Payer: Self-pay

## 2023-07-18 ENCOUNTER — Ambulatory Visit (HOSPITAL_COMMUNITY)
Admission: RE | Admit: 2023-07-18 | Discharge: 2023-07-18 | Disposition: A | Discharge status: Home / Self Care | Payer: Medicare Other | Source: Ambulatory Visit | Attending: Internal Medicine | Admitting: Internal Medicine

## 2023-07-18 DIAGNOSIS — N3289 Other specified disorders of bladder: Secondary | ICD-10-CM | POA: Diagnosis not present

## 2023-07-18 DIAGNOSIS — C3490 Malignant neoplasm of unspecified part of unspecified bronchus or lung: Secondary | ICD-10-CM | POA: Diagnosis not present

## 2023-07-18 DIAGNOSIS — K8020 Calculus of gallbladder without cholecystitis without obstruction: Secondary | ICD-10-CM | POA: Diagnosis not present

## 2023-07-18 DIAGNOSIS — R590 Localized enlarged lymph nodes: Secondary | ICD-10-CM | POA: Diagnosis not present

## 2023-07-18 MED ORDER — SODIUM CHLORIDE (PF) 0.9 % IJ SOLN
INTRAMUSCULAR | Status: AC
  Filled 2023-07-18: qty 50

## 2023-07-18 MED ORDER — IOHEXOL 300 MG/ML  SOLN
100.0000 mL | Freq: Once | INTRAMUSCULAR | Status: AC | PRN
  Administered 2023-07-18: 100 mL via INTRAVENOUS

## 2023-07-20 DIAGNOSIS — Z85828 Personal history of other malignant neoplasm of skin: Secondary | ICD-10-CM | POA: Diagnosis not present

## 2023-07-20 DIAGNOSIS — Z08 Encounter for follow-up examination after completed treatment for malignant neoplasm: Secondary | ICD-10-CM | POA: Diagnosis not present

## 2023-07-25 ENCOUNTER — Ambulatory Visit: Payer: Medicare Other | Admitting: Internal Medicine

## 2023-07-31 ENCOUNTER — Ambulatory Visit (INDEPENDENT_AMBULATORY_CARE_PROVIDER_SITE_OTHER): Admitting: Podiatry

## 2023-07-31 DIAGNOSIS — B351 Tinea unguium: Secondary | ICD-10-CM

## 2023-07-31 MED ORDER — FLUCONAZOLE 150 MG PO TABS: 150.0000 mg | ORAL_TABLET | ORAL | 0 refills | Status: DC

## 2023-07-31 NOTE — Progress Notes (Signed)
  Subjective:  Patient ID: [name], male    DOB: February 07, 1946,   MRN: [id number]  No chief complaint on file.   78 y.o. male presents for follow-up of fungal nails relates has not noticed much differenc  . Denies any other pedal complaints. Denies n/v/f/c.   Past Medical History:  Diagnosis Date   Cholelithiases 07/16/2015   Complication of anesthesia 2007   quit breathing with bronchoscopy, had to spend night   History of agent Orange exposure 44-45 yrs ago   History of pneumonia  last 3-4 yrs ago   3 -4 different times   Hypercholesterolemia    Hypertension    Hypothyroidism    lung ca dx'd 2007   lung right   Sinus disease    hx of   Sleep apnea    uses cpap setting opf 12    Objective:  Physical Exam: Vascular: DP/PT pulses 2/4 bilateral. CFT <3 seconds. Normal hair growth on digits. No edema.  Skin. No lacerations or abrasions bilateral feet. Bilateral nails 1-5 except right fourth are thickened dystrophic and discolored with subungual debris. Improvement noted to proximal nail plate.  Musculoskeletal: MMT 5/5 bilateral lower extremities in DF, PF, Inversion and Eversion. Deceased ROM in DF of ankle joint.  Neurological: Sensation intact to light touch.   Assessment:   1. Onychomycosis        Plan:  Patient was evaluated and treated and all questions answered. -Examined patient -Discussed treatment options for painful dystrophic nails  -Culture positive for candida infection.  -Discussed fungal nail treatment options including oral, topical, and laser treatments.  -Would like to procced with oral fluconazole  weekly for eight more weeks.  -Patient to return in 3 month for recheck.    Jennefer Moats, DPM

## 2023-08-01 ENCOUNTER — Encounter: Payer: Self-pay | Admitting: Thoracic Surgery (Cardiothoracic Vascular Surgery)

## 2023-08-01 ENCOUNTER — Ambulatory Visit
Payer: Medicare Other | Attending: Thoracic Surgery (Cardiothoracic Vascular Surgery) | Admitting: Thoracic Surgery (Cardiothoracic Vascular Surgery)

## 2023-08-01 ENCOUNTER — Inpatient Hospital Stay (HOSPITAL_BASED_OUTPATIENT_CLINIC_OR_DEPARTMENT_OTHER): Payer: Medicare Other | Admitting: Internal Medicine

## 2023-08-01 VITALS — BP 124/66 | HR 58 | Resp 20 | Ht 72.0 in | Wt 238.0 lb

## 2023-08-01 VITALS — BP 135/66 | HR 65 | Temp 98.3°F | Resp 17 | Ht 72.0 in | Wt 238.4 lb

## 2023-08-01 DIAGNOSIS — I8393 Asymptomatic varicose veins of bilateral lower extremities: Secondary | ICD-10-CM

## 2023-08-01 DIAGNOSIS — C3431 Malignant neoplasm of lower lobe, right bronchus or lung: Secondary | ICD-10-CM | POA: Insufficient documentation

## 2023-08-01 DIAGNOSIS — Z902 Acquired absence of lung [part of]: Secondary | ICD-10-CM | POA: Diagnosis not present

## 2023-08-01 NOTE — Progress Notes (Signed)
 [address]       [name] [id number]             [phone]     HPI: [name] returns for scheduled follow-up regarding his lung cancer.  [name] is a 78 year old man with a history of a right upper lobectomy for adenocarcinoma in 2007.  Developed a recurrence in 2017.  Biopsy showed adenocarcinoma positive for EGFR.  He was on Tarceva  initially but about a year ago had some progression and switched to Tagrisso .  He saw Dr. Marguerita Shih earlier today.  He is feeling well.  He is walking about every day.  Has not had any significant change in appetite or weight loss headaches or visual changes.  Past Medical History:  Diagnosis Date   Cholelithiases 07/16/2015   Complication of anesthesia 2007   quit breathing with bronchoscopy, had to spend night   History of agent Orange exposure 44-45 yrs ago   History of pneumonia  last 3-4 yrs ago   3 -4 different times   Hypercholesterolemia    Hypertension    Hypothyroidism    lung ca dx'd 2007   lung right   Sinus disease    hx of   Sleep apnea    uses cpap setting opf 12    Current Outpatient Medications  Medication Sig Dispense Refill   amitriptyline (ELAVIL) 50 MG tablet Take 50 mg by mouth at bedtime.     amLODipine  (NORVASC ) 10 MG tablet Take 10 mg by mouth daily.     aspirin 81 MG tablet Take 81 mg by mouth every morning.      Cholecalciferol 2000 units CAPS Take 2,000 Units by mouth daily.      cloNIDine (CATAPRES) 0.1 MG tablet Take 0.1 mg by mouth daily.     fluconazole  (DIFLUCAN ) 150 MG tablet Take 1 tablet (150 mg total) by mouth once a week. 8 tablet 0   levETIRAcetam (KEPPRA) 750 MG tablet Take 750 mg by mouth at bedtime.      metoprolol  succinate (TOPROL -XL) 50 MG 24 hr tablet Take 1 tablet (50 mg total) by mouth daily. Take with or immediately following a meal. 90 tablet 3   Multiple Vitamins-Minerals (CENTRUM SILVER PO) Take 1 tablet by mouth daily.      nitroGLYCERIN  (NITROSTAT ) 0.4 MG  SL tablet DISSOLVE 1 TAB UNDER TONGUE FOR CHEST PAIN - IF PAIN REMAINS AFTER 5 MIN, CALL 911 AND REPEAT DOSE. MAX 3 TABS IN 15 MINUTES 25 tablet 10   osimertinib  mesylate (TAGRISSO ) 80 MG tablet Take 1 tablet (80 mg total) by mouth daily. 30 tablet 3   polyethylene glycol (MIRALAX / GLYCOLAX) packet Take 17 g by mouth daily. Reported on 08/27/2015     psyllium (METAMUCIL) 58.6 % powder Take 1 packet by mouth daily. Reported on 08/27/2015     rosuvastatin (CRESTOR) 5 MG tablet Take 5 mg by mouth every morning.      SYNTHROID 112 MCG tablet Take 112 mcg by mouth daily.     valsartan-hydrochlorothiazide (DIOVAN-HCT) 320-25 MG per tablet Take 1 tablet by mouth every morning.      No current facility-administered medications for this visit.    Physical Exam BP 124/66   Pulse (!) 58   Resp 20   Ht 6' (1.829 m)   Wt 238 lb (108 kg)   SpO2 96% Comment: RA  BMI 32.28 kg/m  Well-appearing 78 year old man in no acute distress Alert and oriented  x 3 with no focal deficits Lungs slightly diminished to right base but otherwise clear Cardiac regular rate and rhythm with a faint systolic murmur  Diagnostic Tests: CT CHEST, ABDOMEN, AND PELVIS WITH CONTRAST   TECHNIQUE: Multidetector CT imaging of the chest, abdomen and pelvis was performed following the standard protocol during bolus administration of intravenous contrast.   RADIATION DOSE REDUCTION: This exam was performed according to the departmental dose-optimization program which includes automated exposure control, adjustment of the mA and/or kV according to patient size and/or use of iterative reconstruction technique.   CONTRAST:  OMNIPAQUE  IOHEXOL  300 MG/ML  SOLN   COMPARISON:  CT 03/07/2023 and older   FINDINGS: CT CHEST FINDINGS   Cardiovascular: Thoracic aorta is normal course and caliber with mild atherosclerotic calcified plaque. Heart is nonenlarged. No pericardial effusion. Coronary artery calcifications are seen.  There is shift of the mediastinum from left to right with volume loss associated with the right hemithorax.   Mediastinum/Nodes: Tiny nodule along the isthmus of the thyroid  gland is stable. Slightly patulous thoracic esophagus.No specific abnormal lymph node enlargement identified in the axillary region or left hilum. There are several prominent mildly enlarged mediastinal nodes. Many are under a cm in short axis. Previous example of some of the larger foci include a subcarinal lesion which previously was measured at 12 x 11 mm and today when measured in the same fashion 12 x 10 mm on image 29 of series 2. Small nodes are seen paratracheal, AP window as well.   Lungs/Pleura: Left lung has some dependent atelectasis. No consolidation, pneumothorax or effusion.   There is volume loss along the right hemithorax with surgical changes at the right hilum. Masslike area along the suture line which previously measured 2.8 x 2.3 cm, today on image 45 of series 6 measures 2.9 by 2.2 cm, unchanged from previous. Bandlike areas extending away from this with thickening and scarring are unchanged. Mild dependent atelectasis in the right and the right lung base. Elevated right hemidiaphragm associated with volume loss.   Musculoskeletal: Mild degenerative changes along the spine.   CT ABDOMEN PELVIS FINDINGS   Hepatobiliary: No space-occupying liver lesion. Patent portal vein. Large stone in the nondilated gallbladder.   Pancreas: Unremarkable. No pancreatic ductal dilatation or surrounding inflammatory changes.   Spleen: Normal in size without focal abnormality.   Adrenals/Urinary Tract: Adrenal glands are preserved. No enhancing renal mass or collecting system dilatation. There is exophytic upper pole right-sided renal cyst once again identified measuring 3.8 cm. Hounsfield unit ten. Nonspecific perinephric stranding. The ureters have normal course and caliber extending down to the  urinary bladder. Bladder wall slightly thickened and trabeculated. Unchanged from previous.   Stomach/Bowel: Large bowel is normal course and caliber. Moderate colonic stool. Left-sided colonic diverticula. Normal appendix. Stomach and small bowel is nondilated.   Vascular/Lymphatic: Aortic atherosclerosis. No enlarged abdominal or pelvic lymph nodes.   Reproductive: Heterogeneous prostate which is enlarged. Please correlate with the patient's PSA.   Other: Small fat containing inguinal hernias. No free air or free fluid.   Musculoskeletal: Mild degenerative changes seen of the spine and pelvis.   IMPRESSION: No significant interval change.   Stable posttreatment changes related to right upper lobectomy. The hilar nodule in this location is similar to previous. In addition stable prominent to mildly enlarged mediastinal lymph nodes.   No new mass lesion, fluid collection or lymph node enlargement.   Gallstone.   Heterogeneous enlarged prostate. Please correlate with the patient's PSA. There  is also some wall thickening and trabeculation of the bladder which is stable. Please correlate for any evidence of BPH.   Colonic diverticula.     Electronically Signed   By: Adrianna Horde M.D.   On: 07/31/2023 10:42 I personally reviewed the chest CT images.  Multiple masses in right lung similar to improved in size.  No progression.  Impression: [name] is a 78 year old man with a history of a right upper lobectomy for adenocarcinoma in 2007.  Developed a recurrence in 2017.  Biopsy showed adenocarcinoma positive for EGFR.    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  Tolerating treatment well.  I spent 14 minutes in review of records, images, and consultation with [name] today. Plan: Will plan on seeing him back in 6 months after his next CT  Zelphia Higashi, MD [facility] [phone]

## 2023-08-01 NOTE — Progress Notes (Signed)
 [facility] [phone]   [phone]  OFFICE PROGRESS NOTE  [name], MD 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  PRIOR THERAPY: 1) Status post right upper lobectomy under the care of Dr. Luna Salinas on 01/23/2006.  2) Tarceva  150 mg by mouth daily as part of the BMS checkmate 370 clinical trial. Status post 6 weeks of treatment. 3) Tarceva  100 mg by mouth daily as part of the BMS Checkmate 370 clinical trial. First dose started 07/17/2014 status post 101 months of treatment  CURRENT THERAPY: Tagrisso  80 mg p.o. daily started 12/28/2022.  INTERVAL HISTORY: [name] 78 y.o. male returns to the clinic today for follow-up visit. Discussed the use of AI scribe software for clinical note transcription with the patient, who gave verbal consent to proceed.  History of Present Illness   [name] is a 78 year old male with recurrent non-small cell lung cancer who presents for evaluation and repeat CT scan for restaging of his disease.  He has a history of recurrent non-small cell lung cancer, adenocarcinoma with a positive EGFR mutation (exon 21 L858R), initially diagnosed as stage I in September 2007. He is currently on Tagrisso  80 mg PO daily, which was started on December 28, 2022. He experiences some fatigue over the course of the day but remains able to walk and perform his normal activities.  He underwent a CT scan of the chest, abdomen, and pelvis last week for restaging purposes. No new complaints or symptoms have arisen since his last visit.  He also has a history of an enlarged prostate but has not been actively followed by a urologist recently. His family doctor had previously referred him to a urologist, but it has been a long time since he  last saw them.  He reports feeling 'a little tired' but denies any other new symptoms or complaints.         MEDICAL HISTORY: Past Medical History:  Diagnosis Date   Cholelithiases 07/16/2015   Complication of anesthesia 2007   quit breathing with bronchoscopy, had to spend night   History of agent Orange exposure 44-45 yrs ago   History of pneumonia  last 3-4 yrs ago   3 -4 different times   Hypercholesterolemia    Hypertension    Hypothyroidism    lung ca dx'd 2007   lung right   Sinus disease    hx of   Sleep apnea    uses cpap setting opf 12    ALLERGIES:  is allergic to lisinopril, prednisone, and furosemide .  MEDICATIONS:  Current Outpatient Medications  Medication Sig Dispense Refill   amitriptyline (ELAVIL) 50 MG tablet Take 50 mg by mouth at bedtime.     aspirin 81 MG tablet Take 81 mg by mouth every morning.      Cholecalciferol 2000 units CAPS Take 2,000 Units by mouth daily.      clindamycin  (CLEOCIN  T) 1 % external solution APPLY TO AFFECTED AREA(S) TWO TIMES A DAY AS NEEDED FOR SKIN IRRITATION 30 mL 0   cloNIDine (CATAPRES) 0.1 MG tablet Take 0.1 mg by mouth daily.     cloNIDine (CATAPRES) 0.2 MG tablet Take 0.2 mg by mouth daily.     fluconazole  (DIFLUCAN ) 150 MG tablet Take 1 tablet (150 mg total)  by mouth once a week. 8 tablet 0   levETIRAcetam (KEPPRA) 750 MG tablet Take 750 mg by mouth at bedtime.      metoprolol  succinate (TOPROL -XL) 50 MG 24 hr tablet Take 1 tablet (50 mg total) by mouth daily. Take with or immediately following a meal. 90 tablet 3   Multiple Vitamins-Minerals (CENTRUM SILVER PO) Take 1 tablet by mouth daily.      nitroGLYCERIN  (NITROSTAT ) 0.4 MG SL tablet DISSOLVE 1 TAB UNDER TONGUE FOR CHEST PAIN - IF PAIN REMAINS AFTER 5 MIN, CALL 911 AND REPEAT DOSE. MAX 3 TABS IN 15 MINUTES 25 tablet 10   osimertinib  mesylate (TAGRISSO ) 80 MG tablet Take 1 tablet (80 mg total) by mouth daily. 30 tablet 3   polyethylene glycol (MIRALAX / GLYCOLAX)  packet Take 17 g by mouth daily. Reported on 08/27/2015     psyllium (METAMUCIL) 58.6 % powder Take 1 packet by mouth daily. Reported on 08/27/2015     rosuvastatin (CRESTOR) 5 MG tablet Take 5 mg by mouth every morning.      SYNTHROID 112 MCG tablet Take 112 mcg by mouth daily.     valsartan-hydrochlorothiazide (DIOVAN-HCT) 320-25 MG per tablet Take 1 tablet by mouth every morning.      No current facility-administered medications for this visit.    SURGICAL HISTORY:  Past Surgical History:  Procedure Laterality Date   BIOPSY  05/21/2018   Procedure: BIOPSY;  Surgeon: Celedonio Coil, MD;  Location: [facility];  Service: Endoscopy;;   BIOPSY  05/11/2021   Procedure: BIOPSY;  Surgeon: Felecia Hopper, MD;  Location: [facility];  Service: Gastroenterology;;   COLONOSCOPY WITH PROPOFOL  N/A 03/19/2013   Procedure: COLONOSCOPY WITH PROPOFOL ;  Surgeon: Garrett Kallman, MD;  Location: [facility];  Service: Endoscopy;  Laterality: N/A;   COLONOSCOPY WITH PROPOFOL  N/A 05/11/2021   Procedure: COLONOSCOPY WITH PROPOFOL ;  Surgeon: Felecia Hopper, MD;  Location: [facility];  Service: Gastroenterology;  Laterality: N/A;   Cystourethroscopy, [facility] TURP.  04/16/2010   ESOPHAGOGASTRODUODENOSCOPY (EGD) WITH PROPOFOL  N/A 05/21/2018   Procedure: ESOPHAGOGASTRODUODENOSCOPY (EGD) WITH PROPOFOL ;  Surgeon: Celedonio Coil, MD;  Location: [facility];  Service: Endoscopy;  Laterality: N/A;   LASER ABLATION Right 01/26/2023   Laser ablation of right greater saphenous vein with >20 stab phlebectomies   LASER ABLATION Left 06/15/2023   Laser ablation of left greater saphenous vein   NASAL SINUS SURGERY  04/04/1985   POLYPECTOMY  05/11/2021   Procedure: POLYPECTOMY;  Surgeon: Felecia Hopper, MD;  Location: [facility];  Service: Gastroenterology;;   Right video-assisted thoracoscopy, right upper lobectomy and  01/23/2006   The Olympus video bronchoscope was introduced via the right  12/21/2005    TONSILLECTOMY  04/05/1955   Torn medial and lateral menisci, left knee.  11/06/2006    REVIEW OF SYSTEMS:  Constitutional: positive for fatigue Eyes: negative Ears, nose, mouth, throat, and face: negative Respiratory: negative Cardiovascular: negative Gastrointestinal: negative Genitourinary:negative Integument/breast: negative Hematologic/lymphatic: negative Musculoskeletal:negative Neurological: negative Behavioral/Psych: negative Endocrine: negative Allergic/Immunologic: negative   PHYSICAL EXAMINATION: General appearance: alert, cooperative, fatigued, and no distress Head: Normocephalic, without obvious abnormality, atraumatic Neck: no adenopathy, no JVD, supple, symmetrical, trachea midline, and thyroid  not enlarged, symmetric, no tenderness/mass/nodules Lymph nodes: Cervical, supraclavicular, and axillary nodes normal. Resp: clear to auscultation bilaterally Back: symmetric, no curvature. ROM normal. No CVA tenderness. Cardio: regular rate and rhythm, S1, S2 normal, no murmur, click, rub or gallop GI: soft, non-tender; bowel sounds normal; no masses,  no organomegaly Extremities: extremities  normal, atraumatic, no cyanosis or edema Neurologic: Alert and oriented X 3, normal strength and tone. Normal symmetric reflexes. Normal coordination and gait  ECOG PERFORMANCE STATUS: 1 - Symptomatic but completely ambulatory  Blood pressure 135/66, pulse 65, temperature 98.3 F (36.8 C), temperature source Temporal, resp. rate 17, height 6' (1.829 m), weight 238 lb 6.4 oz (108.1 kg), SpO2 100%.  LABORATORY DATA: Lab Results  Component Value Date   WBC 4.7 07/17/2023   HGB 11.9 (L) 07/17/2023   HCT 34.1 (L) 07/17/2023   MCV 98.8 07/17/2023   PLT 105 (L) 07/17/2023      Chemistry      Component Value Date/Time   NA 136 07/17/2023 0849   NA 141 03/11/2019 0959   NA 139 04/06/2017 0932   K 3.8 07/17/2023 0849   K 3.8 04/06/2017 0932   CL 100 07/17/2023 0849   CL 102  04/24/2012 0853   CO2 30 07/17/2023 0849   CO2 29 04/06/2017 0932   BUN 20 07/17/2023 0849   BUN 18 03/11/2019 0959   BUN 14.4 04/06/2017 0932   CREATININE 1.09 07/17/2023 0849   CREATININE 1.0 04/06/2017 0932      Component Value Date/Time   CALCIUM 9.7 07/17/2023 0849   CALCIUM 9.5 04/06/2017 0932   ALKPHOS 81 07/17/2023 0849   ALKPHOS 58 04/06/2017 0932   AST 29 07/17/2023 0849   AST 27 04/06/2017 0932   ALT 27 07/17/2023 0849   ALT 27 04/06/2017 0932   BILITOT 0.4 07/17/2023 0849   BILITOT 0.45 04/06/2017 0932       RADIOGRAPHIC STUDIES: CT Chest W Contrast Result Date: 07/31/2023 CLINICAL DATA:  Staging non-small-cell lung cancer. * Tracking Code: BO * EXAM: CT CHEST, ABDOMEN, AND PELVIS WITH CONTRAST TECHNIQUE: Multidetector CT imaging of the chest, abdomen and pelvis was performed following the standard protocol during bolus administration of intravenous contrast. RADIATION DOSE REDUCTION: This exam was performed according to the departmental dose-optimization program which includes automated exposure control, adjustment of the mA and/or kV according to patient size and/or use of iterative reconstruction technique. CONTRAST:  OMNIPAQUE  IOHEXOL  300 MG/ML  SOLN COMPARISON:  CT 03/07/2023 and older FINDINGS: CT CHEST FINDINGS Cardiovascular: Thoracic aorta is normal course and caliber with mild atherosclerotic calcified plaque. Heart is nonenlarged. No pericardial effusion. Coronary artery calcifications are seen. There is shift of the mediastinum from left to right with volume loss associated with the right hemithorax. Mediastinum/Nodes: Tiny nodule along the isthmus of the thyroid  gland is stable. Slightly patulous thoracic esophagus.No specific abnormal lymph node enlargement identified in the axillary region or left hilum. There are several prominent mildly enlarged mediastinal nodes. Many are under a cm in short axis. Previous example of some of the larger foci include a  subcarinal lesion which previously was measured at 12 x 11 mm and today when measured in the same fashion 12 x 10 mm on image 29 of series 2. Small nodes are seen paratracheal, AP window as well. Lungs/Pleura: Left lung has some dependent atelectasis. No consolidation, pneumothorax or effusion. There is volume loss along the right hemithorax with surgical changes at the right hilum. Masslike area along the suture line which previously measured 2.8 x 2.3 cm, today on image 45 of series 6 measures 2.9 by 2.2 cm, unchanged from previous. Bandlike areas extending away from this with thickening and scarring are unchanged. Mild dependent atelectasis in the right and the right lung base. Elevated right hemidiaphragm associated with volume loss. Musculoskeletal: Mild  degenerative changes along the spine. CT ABDOMEN PELVIS FINDINGS Hepatobiliary: No space-occupying liver lesion. Patent portal vein. Large stone in the nondilated gallbladder. Pancreas: Unremarkable. No pancreatic ductal dilatation or surrounding inflammatory changes. Spleen: Normal in size without focal abnormality. Adrenals/Urinary Tract: Adrenal glands are preserved. No enhancing renal mass or collecting system dilatation. There is exophytic upper pole right-sided renal cyst once again identified measuring 3.8 cm. Hounsfield unit ten. Nonspecific perinephric stranding. The ureters have normal course and caliber extending down to the urinary bladder. Bladder wall slightly thickened and trabeculated. Unchanged from previous. Stomach/Bowel: Large bowel is normal course and caliber. Moderate colonic stool. Left-sided colonic diverticula. Normal appendix. Stomach and small bowel is nondilated. Vascular/Lymphatic: Aortic atherosclerosis. No enlarged abdominal or pelvic lymph nodes. Reproductive: Heterogeneous prostate which is enlarged. Please correlate with the patient's PSA. Other: Small fat containing inguinal hernias. No free air or free fluid.  Musculoskeletal: Mild degenerative changes seen of the spine and pelvis. IMPRESSION: No significant interval change. Stable posttreatment changes related to right upper lobectomy. The hilar nodule in this location is similar to previous. In addition stable prominent to mildly enlarged mediastinal lymph nodes. No new mass lesion, fluid collection or lymph node enlargement. Gallstone. Heterogeneous enlarged prostate. Please correlate with the patient's PSA. There is also some wall thickening and trabeculation of the bladder which is stable. Please correlate for any evidence of BPH. Colonic diverticula. Electronically Signed   By: Adrianna Horde M.D.   On: 07/31/2023 10:42   CT ABDOMEN PELVIS W CONTRAST Result Date: 07/31/2023 CLINICAL DATA:  Staging non-small-cell lung cancer. * Tracking Code: BO * EXAM: CT CHEST, ABDOMEN, AND PELVIS WITH CONTRAST TECHNIQUE: Multidetector CT imaging of the chest, abdomen and pelvis was performed following the standard protocol during bolus administration of intravenous contrast. RADIATION DOSE REDUCTION: This exam was performed according to the departmental dose-optimization program which includes automated exposure control, adjustment of the mA and/or kV according to patient size and/or use of iterative reconstruction technique. CONTRAST:  OMNIPAQUE  IOHEXOL  300 MG/ML  SOLN COMPARISON:  CT 03/07/2023 and older FINDINGS: CT CHEST FINDINGS Cardiovascular: Thoracic aorta is normal course and caliber with mild atherosclerotic calcified plaque. Heart is nonenlarged. No pericardial effusion. Coronary artery calcifications are seen. There is shift of the mediastinum from left to right with volume loss associated with the right hemithorax. Mediastinum/Nodes: Tiny nodule along the isthmus of the thyroid  gland is stable. Slightly patulous thoracic esophagus.No specific abnormal lymph node enlargement identified in the axillary region or left hilum. There are several prominent mildly  enlarged mediastinal nodes. Many are under a cm in short axis. Previous example of some of the larger foci include a subcarinal lesion which previously was measured at 12 x 11 mm and today when measured in the same fashion 12 x 10 mm on image 29 of series 2. Small nodes are seen paratracheal, AP window as well. Lungs/Pleura: Left lung has some dependent atelectasis. No consolidation, pneumothorax or effusion. There is volume loss along the right hemithorax with surgical changes at the right hilum. Masslike area along the suture line which previously measured 2.8 x 2.3 cm, today on image 45 of series 6 measures 2.9 by 2.2 cm, unchanged from previous. Bandlike areas extending away from this with thickening and scarring are unchanged. Mild dependent atelectasis in the right and the right lung base. Elevated right hemidiaphragm associated with volume loss. Musculoskeletal: Mild degenerative changes along the spine. CT ABDOMEN PELVIS FINDINGS Hepatobiliary: No space-occupying liver lesion. Patent portal vein. Large stone  in the nondilated gallbladder. Pancreas: Unremarkable. No pancreatic ductal dilatation or surrounding inflammatory changes. Spleen: Normal in size without focal abnormality. Adrenals/Urinary Tract: Adrenal glands are preserved. No enhancing renal mass or collecting system dilatation. There is exophytic upper pole right-sided renal cyst once again identified measuring 3.8 cm. Hounsfield unit ten. Nonspecific perinephric stranding. The ureters have normal course and caliber extending down to the urinary bladder. Bladder wall slightly thickened and trabeculated. Unchanged from previous. Stomach/Bowel: Large bowel is normal course and caliber. Moderate colonic stool. Left-sided colonic diverticula. Normal appendix. Stomach and small bowel is nondilated. Vascular/Lymphatic: Aortic atherosclerosis. No enlarged abdominal or pelvic lymph nodes. Reproductive: Heterogeneous prostate which is enlarged. Please  correlate with the patient's PSA. Other: Small fat containing inguinal hernias. No free air or free fluid. Musculoskeletal: Mild degenerative changes seen of the spine and pelvis. IMPRESSION: No significant interval change. Stable posttreatment changes related to right upper lobectomy. The hilar nodule in this location is similar to previous. In addition stable prominent to mildly enlarged mediastinal lymph nodes. No new mass lesion, fluid collection or lymph node enlargement. Gallstone. Heterogeneous enlarged prostate. Please correlate with the patient's PSA. There is also some wall thickening and trabeculation of the bladder which is stable. Please correlate for any evidence of BPH. Colonic diverticula. Electronically Signed   By: Adrianna Horde M.D.   On: 07/31/2023 10:42     ASSESSMENT AND PLAN:  This is a very pleasant 78 years old white male with recurrent non-small cell lung cancer, adenocarcinoma and positive EGFR mutation in exon 21. He is currently on Tarceva  100 mg by mouth daily status post around 101 months of treatment. The patient had evidence for disease progression and he is switching to treatment with Tagrisso  80 mg p.o. daily on December 28, 2022.  He has been tolerating this treatment fairly well. He had repeat CT scan of the chest, abdomen and pelvis performed recently.  I personally and independently reviewed the scan and discussed the results with the patient today.  His scan showed no concerning findings for disease progression.    Recurrent non-small cell lung cancer, adenocarcinoma Recurrent non-small cell lung cancer, adenocarcinoma with positive EGFR mutation (exon 21 L858R). Initially diagnosed as stage I in September 2007. Currently well-managed on Tagrisso  80 mg PO daily since December 28, 2022. Recent CT scan shows no growth or metastasis. Reports mild fatigue, likely due to mild anemia and Tagrisso . - Continue Tagrisso  80 mg PO daily - Schedule blood work in 3 months -  Schedule blood work and CT scan in 6 months  Mild anemia Mild anemia with hemoglobin approximately 11.9 g/dL, contributing to mild fatigue.  Enlarged prostate Enlarged prostate noted on recent imaging. Has not been actively followed by urology recently. Advised to discuss with family doctor for potential referral or management. - Discuss prostate management with family doctor - Consider referral to [facility] for further evaluation   The patient was advised to call immediately if he has any other concerning symptoms in the interval. The patient voices understanding of current disease status and treatment options and is in agreement with the current care plan. All questions were answered. The patient knows to call the clinic with any problems, questions or concerns. We can certainly see the patient much sooner if necessary. The total time spent in the appointment was 30 minutes.  Disclaimer: This note was dictated with voice recognition software. Similar sounding words can inadvertently be transcribed and may not be corrected upon review.

## 2023-08-02 DIAGNOSIS — N400 Enlarged prostate without lower urinary tract symptoms: Secondary | ICD-10-CM | POA: Diagnosis not present

## 2023-08-02 DIAGNOSIS — C3490 Malignant neoplasm of unspecified part of unspecified bronchus or lung: Secondary | ICD-10-CM | POA: Diagnosis not present

## 2023-08-02 DIAGNOSIS — I10 Essential (primary) hypertension: Secondary | ICD-10-CM | POA: Diagnosis not present

## 2023-08-02 DIAGNOSIS — I700 Atherosclerosis of aorta: Secondary | ICD-10-CM | POA: Diagnosis not present

## 2023-08-02 DIAGNOSIS — J439 Emphysema, unspecified: Secondary | ICD-10-CM | POA: Diagnosis not present

## 2023-08-02 DIAGNOSIS — E785 Hyperlipidemia, unspecified: Secondary | ICD-10-CM | POA: Diagnosis not present

## 2023-08-07 ENCOUNTER — Other Ambulatory Visit: Payer: Self-pay

## 2023-08-10 ENCOUNTER — Other Ambulatory Visit: Payer: Self-pay | Admitting: Internal Medicine

## 2023-08-10 ENCOUNTER — Other Ambulatory Visit: Payer: Self-pay

## 2023-08-10 DIAGNOSIS — J439 Emphysema, unspecified: Secondary | ICD-10-CM | POA: Diagnosis not present

## 2023-08-10 DIAGNOSIS — I10 Essential (primary) hypertension: Secondary | ICD-10-CM | POA: Diagnosis not present

## 2023-08-10 DIAGNOSIS — C3431 Malignant neoplasm of lower lobe, right bronchus or lung: Secondary | ICD-10-CM

## 2023-08-10 DIAGNOSIS — I700 Atherosclerosis of aorta: Secondary | ICD-10-CM | POA: Diagnosis not present

## 2023-08-10 MED ORDER — OSIMERTINIB MESYLATE 80 MG PO TABS
80.0000 mg | ORAL_TABLET | Freq: Every day | ORAL | 3 refills | Status: DC
  Filled 2023-08-10: qty 30, 30d supply, fill #0
  Filled 2023-09-13: qty 30, 30d supply, fill #1
  Filled 2023-10-09 – 2023-10-13 (×2): qty 30, 30d supply, fill #2
  Filled 2023-11-02 – 2023-11-09 (×2): qty 30, 30d supply, fill #3

## 2023-08-10 NOTE — Progress Notes (Signed)
 Specialty Pharmacy Refill Coordination Note  [name] is a 78 y.o. male contacted today regarding refills of specialty medication(s) Osimertinib  Mesylate (TAGRISSO )   Patient requested (Patient-Rptd) Pickup at [facility] at [facility] date: (Patient-Rptd) 08/17/23   Medication will be filled on 08/16/23.   This fill date is pending response to refill request from provider. Patient was made aware via voice mail that if they have not received fill by intended date they must follow up with pharmacy.

## 2023-08-15 ENCOUNTER — Other Ambulatory Visit: Payer: Self-pay

## 2023-08-15 ENCOUNTER — Other Ambulatory Visit (HOSPITAL_COMMUNITY): Payer: Self-pay

## 2023-09-02 DIAGNOSIS — I700 Atherosclerosis of aorta: Secondary | ICD-10-CM | POA: Diagnosis not present

## 2023-09-02 DIAGNOSIS — E785 Hyperlipidemia, unspecified: Secondary | ICD-10-CM | POA: Diagnosis not present

## 2023-09-02 DIAGNOSIS — N400 Enlarged prostate without lower urinary tract symptoms: Secondary | ICD-10-CM | POA: Diagnosis not present

## 2023-09-02 DIAGNOSIS — C3490 Malignant neoplasm of unspecified part of unspecified bronchus or lung: Secondary | ICD-10-CM | POA: Diagnosis not present

## 2023-09-02 DIAGNOSIS — J439 Emphysema, unspecified: Secondary | ICD-10-CM | POA: Diagnosis not present

## 2023-09-02 DIAGNOSIS — I10 Essential (primary) hypertension: Secondary | ICD-10-CM | POA: Diagnosis not present

## 2023-09-09 DIAGNOSIS — I700 Atherosclerosis of aorta: Secondary | ICD-10-CM | POA: Diagnosis not present

## 2023-09-09 DIAGNOSIS — J439 Emphysema, unspecified: Secondary | ICD-10-CM | POA: Diagnosis not present

## 2023-09-09 DIAGNOSIS — I10 Essential (primary) hypertension: Secondary | ICD-10-CM | POA: Diagnosis not present

## 2023-09-13 ENCOUNTER — Encounter (INDEPENDENT_AMBULATORY_CARE_PROVIDER_SITE_OTHER): Payer: Self-pay

## 2023-09-13 ENCOUNTER — Other Ambulatory Visit: Payer: Self-pay

## 2023-09-13 NOTE — Progress Notes (Signed)
 Specialty Pharmacy Refill Coordination Note  [name] is a 78 y.o. male contacted today regarding refills of specialty medication(s) Osimertinib  Mesylate (TAGRISSO )   Patient requested [name] at [place] date: 09/21/23   Medication will be filled on 09/20/23.

## 2023-09-21 ENCOUNTER — Other Ambulatory Visit (HOSPITAL_COMMUNITY): Payer: Self-pay

## 2023-09-21 ENCOUNTER — Other Ambulatory Visit: Payer: Self-pay

## 2023-09-21 NOTE — Progress Notes (Signed)
 Specialty Pharmacy Ongoing Clinical Assessment Note  [name] is a 78 y.o. male who is being followed by the specialty pharmacy service for Rx[facility] Oncology   Patient's specialty medication(s) reviewed today: Osimertinib  Mesylate (TAGRISSO )   Missed doses in the last 4 weeks: 0   Patient/Caregiver did not have any additional questions or concerns.   Therapeutic benefit summary: Patient is achieving benefit   Adverse events/side effects summary: No adverse events/side effects   Patient's therapy is appropriate to: Continue    Goals Addressed             This Visit's Progress    Slow Disease Progression       Patient is on track. Patient will maintain adherence. Per visit on 08/01/23, CT of chest, abdomen and pelvis showed no concerning findings for disease progression, and no growth or metastasis.          Follow up: 3 months  [facility]

## 2023-09-25 ENCOUNTER — Encounter: Payer: Self-pay | Admitting: Podiatry

## 2023-09-25 ENCOUNTER — Ambulatory Visit (INDEPENDENT_AMBULATORY_CARE_PROVIDER_SITE_OTHER): Admitting: Podiatry

## 2023-09-25 DIAGNOSIS — B351 Tinea unguium: Secondary | ICD-10-CM

## 2023-09-25 NOTE — Addendum Note (Signed)
 Addended by: ELAYNE ROSINA BRAVO on: 09/25/2023 03:06 PM   Modules accepted: Orders

## 2023-09-25 NOTE — Progress Notes (Signed)
  Subjective:  Patient ID: [name], male    DOB: 06-Mar-1946,   MRN: [id number]  Chief Complaint  Patient presents with   Nail Problem    Patient currently on fluconazole  therapy for fungal nails, states he's been on it for 12 weeks now and no improvements    78 y.o. male presents for follow-up of fungal nails relates has not noticed much difference. He has been on fluconazole  and does not think it helped. Relates right second toe has been painful.  . Denies any other pedal complaints. Denies n/v/f/c.   Past Medical History:  Diagnosis Date   Cholelithiases 07/16/2015   Complication of anesthesia 2007   quit breathing with bronchoscopy, had to spend night   History of agent Orange exposure 44-45 yrs ago   History of pneumonia  last 3-4 yrs ago   3 -4 different times   Hypercholesterolemia    Hypertension    Hypothyroidism    lung ca dx'd 2007   lung right   Sinus disease    hx of   Sleep apnea    uses cpap setting opf 12    Objective:  Physical Exam: Vascular: DP/PT pulses 2/4 bilateral. CFT <3 seconds. Normal hair growth on digits. No edema.  Skin. No lacerations or abrasions bilateral feet. Bilateral nails 1-5 except right fourth are thickened dystrophic and discolored with subungual debris. Not much improvement noted to nail plate.  Musculoskeletal: MMT 5/5 bilateral lower extremities in DF, PF, Inversion and Eversion. Deceased ROM in DF of ankle joint.  Neurological: Sensation intact to light touch.   Assessment:   1. Onychomycosis         Plan:  Patient was evaluated and treated and all questions answered. -Examined patient -Discussed treatment options for painful dystrophic nails  -Discussed reculturing nail to see if any fungus still remiains.  -Clinical picture and Fungal culture was obtained by removing a portion of the hard nail itself from each of the involved toenails using a sterile nail nipper and sent to [facility] lab. Patient tolerated the biopsy  procedure well without discomfort or need for anesthesia.  -Discussed fungal nail treatment options including oral, topical, and laser treatments. Dsiucssed laser treatment at this time since we would like to avoid any more oral medicatio.  -Patient to return in 4 weeks for follow up evaluation and discussion of fungal culture results or sooner if symptoms worsen.  -Patient to return in 3 month for recheck.    Asberry Failing, DPM

## 2023-10-02 ENCOUNTER — Encounter: Payer: Self-pay | Admitting: Cardiology

## 2023-10-02 ENCOUNTER — Ambulatory Visit: Attending: Cardiology | Admitting: Cardiology

## 2023-10-02 VITALS — BP 127/70 | HR 59 | Resp 16 | Ht 72.0 in | Wt 235.0 lb

## 2023-10-02 DIAGNOSIS — I83811 Varicose veins of right lower extremities with pain: Secondary | ICD-10-CM | POA: Insufficient documentation

## 2023-10-02 DIAGNOSIS — I83812 Varicose veins of left lower extremities with pain: Secondary | ICD-10-CM | POA: Diagnosis not present

## 2023-10-02 DIAGNOSIS — J439 Emphysema, unspecified: Secondary | ICD-10-CM | POA: Diagnosis not present

## 2023-10-02 DIAGNOSIS — I10 Essential (primary) hypertension: Secondary | ICD-10-CM | POA: Diagnosis not present

## 2023-10-02 DIAGNOSIS — H52203 Unspecified astigmatism, bilateral: Secondary | ICD-10-CM | POA: Diagnosis not present

## 2023-10-02 DIAGNOSIS — C3490 Malignant neoplasm of unspecified part of unspecified bronchus or lung: Secondary | ICD-10-CM | POA: Diagnosis not present

## 2023-10-02 DIAGNOSIS — Z961 Presence of intraocular lens: Secondary | ICD-10-CM | POA: Diagnosis not present

## 2023-10-02 DIAGNOSIS — N400 Enlarged prostate without lower urinary tract symptoms: Secondary | ICD-10-CM | POA: Diagnosis not present

## 2023-10-02 DIAGNOSIS — I440 Atrioventricular block, first degree: Secondary | ICD-10-CM | POA: Diagnosis not present

## 2023-10-02 DIAGNOSIS — E785 Hyperlipidemia, unspecified: Secondary | ICD-10-CM | POA: Diagnosis not present

## 2023-10-02 DIAGNOSIS — I700 Atherosclerosis of aorta: Secondary | ICD-10-CM | POA: Diagnosis not present

## 2023-10-02 MED ORDER — NITROGLYCERIN 0.4 MG SL SUBL: 0.4000 mg | SUBLINGUAL_TABLET | SUBLINGUAL | 4 refills | Status: AC | PRN

## 2023-10-02 MED ORDER — METOPROLOL SUCCINATE ER 25 MG PO TB24: 25.0000 mg | ORAL_TABLET | Freq: Every day | ORAL | 3 refills | Status: AC

## 2023-10-02 NOTE — Patient Instructions (Signed)
 Medication Instructions:  Please decrease to Metoprolol  Succinate 25 mg daily. Continue all other medications as listed.  *If you need a refill on your cardiac medications before your next appointment, please call your pharmacy*  Follow-Up: At [facility], you and your health needs are our priority.  As part of our continuing mission to provide you with exceptional heart care, our providers are all part of one team.  This team includes your primary Cardiologist (physician) and Advanced Practice Providers or APPs (Physician Assistants and Nurse Practitioners) who all work together to provide you with the care you need, when you need it.  Your next appointment:   1 year(s)  Provider:   Dr Oneil Parchment     We recommend signing up for the patient portal called MyChart.  Sign up information is provided on this After Visit Summary.  MyChart is used to connect with patients for Virtual Visits (Telemedicine).  Patients are able to view lab/test results, encounter notes, upcoming appointments, etc.  Non-urgent messages can be sent to your provider as well.   To learn more about what you can do with MyChart, go to [email or web address].

## 2023-10-02 NOTE — Progress Notes (Signed)
 Cardiology Office Note:  .   Date:  10/02/2023  ID:  [name], DOB 08-Feb-1946, MRN [id number] PCP: [name], MD  [facility] Providers Cardiologist:  Oneil Parchment, MD     History of Present Illness: .   [name] is a 78 y.o. male Discussed the use of AI scribe software for clinical note transcription with the patient, who gave verbal consent to proceed.  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 He is accompanied by his wife.  He has increased swelling in his right leg compared to the left, particularly while walking. 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 Serene), which resulted in excellent improvement. His left leg was treated with ablation on June 17, 2023, and he has been doing well since. Recent lower extremity dopplers were negative for deep vein thrombosis (DVT).  He has a history of non-small cell lung cancer adenocarcinoma, initially treated with a right upper lobectomy in 2007. There was a recurrence in 2017, and he is currently under the care of Dr. Kerrin for this condition. He experiences occasional chest pain, which he associates with his lung cancer and past surgery. No current chest pain is reported.  His past medical history includes sleep apnea, hypertension, and a first-degree AV block. A coronary CTA in 2021 showed a calcium score of zero and a mildly dilated ascending aorta at 40 mm, with normal coronary arteries and no coronary artery disease (CAD). A stress test in 2019 was low risk, and an echocardiogram in 2023 showed an ejection fraction of 60-65%.  He is currently on metoprolol , which was reduced from 75 mg to 50 mg due to first-degree AV block. He also takes rosuvastatin 5 mg daily, aspirin 81 mg every  other day, valsartan/hydrochlorothiazide 320/25 mg, and amlodipine  10 mg daily. His blood pressure is well-controlled with these medications.     Studies Reviewed: SABRA   EKG Interpretation Date/Time:  Monday October 02 2023 11:03:01 EDT Ventricular Rate:  56 PR Interval:  240 QRS Duration:  96 QT Interval:  436 QTC Calculation: 420 R Axis:   36  Text Interpretation: Sinus bradycardia with 1st degree A-V block When compared with ECG of 27-Dec-2022 09:59, No significant change was found Confirmed by Parchment Oneil ([id number]) on 10/02/2023 11:09:29 AM    Results LABS LDL cholesterol: 57  RADIOLOGY Coronary CTA: Calcium score of 0, mildly dilated ascending aorta at 40 mm, normal coronary arteries with no CAD (2021)  DIAGNOSTIC Lower extremity dopplers: Negative for DVT Stress test: Low risk (2019) Echocardiogram: EF 60-65% (2023) EKG: Sinus bradycardia with first degree AV block, PR interval 240 ms (10/02/2023) Risk Assessment/Calculations:            Physical Exam:   VS:  BP 127/70 (BP Location: Left Arm, Patient Position: Sitting)   Pulse (!) 59   Resp 16   Ht 6' (1.829 m)   Wt 235 lb (106.6 kg)   SpO2 96%   BMI 31.87 kg/m    Wt Readings from Last 3 Encounters:  10/02/23 235 lb (106.6 kg)  08/01/23 238 lb (108 kg)  08/01/23 238 lb 6.4 oz (108.1 kg)    GEN: Well nourished, well developed in no acute distress NECK: No JVD; No carotid bruits CARDIAC: RRR, no murmurs,  no rubs, no gallops RESPIRATORY:  Clear to auscultation without rales, wheezing or rhonchi  ABDOMEN: Soft, non-tender, non-distended EXTREMITIES: Compression hose Bilaterally mild edema; No deformity   ASSESSMENT AND PLAN: .    Assessment and Plan Assessment & Plan First degree AV block Sinus bradycardia with first degree AV block on EKG, PR interval increased to 240 ms from 238 ms. - Decrease metoprolol  to 25 mg daily. - Provide new prescription for metoprolol .  Primary hypertension Blood pressure  well-controlled at 127/70 mmHg with current regimen of valsartan/hydrochlorothiazide 320/25 mg and amlodipine  10 mg daily.  Hyperlipidemia LDL cholesterol at 57 mg/dL, well-controlled on rosuvastatin 5 mg daily. Aspirin 81 mg every other day due to mild atherosclerotic calcified plaque in the aorta. - Continue current regimen of rosuvastatin and aspirin.  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 No coronary artery disease detected.  Recurrent non-small cell lung cancer Non-small cell lung cancer adenocarcinoma with right upper lobectomy in 2007 and recurrence in 2017. Under surveillance by CT surgery. Occasional chest pain likely related to previous surgery or lung cancer, not cardiac in origin.  Right lower extremity edema Right lower extremity edema secondary to venous insufficiency and varicose veins. Status post endovenous laser ablation of the right saphenous vein and greater than 20 stabs in October 2024 with excellent results. Left leg ablated on June 17, 2023, with good outcome.  Obstructive sleep apnea Obstructive sleep apnea mentioned in history, no specific discussion or changes in management during this visit.          Signed, Oneil Parchment, MD

## 2023-10-03 ENCOUNTER — Other Ambulatory Visit: Payer: Self-pay | Admitting: Podiatry

## 2023-10-09 ENCOUNTER — Other Ambulatory Visit: Payer: Self-pay

## 2023-10-09 ENCOUNTER — Encounter (INDEPENDENT_AMBULATORY_CARE_PROVIDER_SITE_OTHER): Payer: Self-pay

## 2023-10-09 ENCOUNTER — Other Ambulatory Visit: Payer: Self-pay | Admitting: Pharmacy Technician

## 2023-10-09 DIAGNOSIS — J439 Emphysema, unspecified: Secondary | ICD-10-CM | POA: Diagnosis not present

## 2023-10-09 DIAGNOSIS — I10 Essential (primary) hypertension: Secondary | ICD-10-CM | POA: Diagnosis not present

## 2023-10-09 DIAGNOSIS — I700 Atherosclerosis of aorta: Secondary | ICD-10-CM | POA: Diagnosis not present

## 2023-10-09 NOTE — Progress Notes (Signed)
 Specialty Pharmacy Refill Coordination Note  [name] is a 78 y.o. male contacted today regarding refills of specialty medication(s) Osimertinib  Mesylate (TAGRISSO )   Patient requested (Patient-Rptd) Pickup at [facility] at [facility] date: (Patient-Rptd) 10/12/23   Medication will be filled on 10/12/23.

## 2023-10-11 ENCOUNTER — Other Ambulatory Visit (HOSPITAL_COMMUNITY): Payer: Self-pay

## 2023-10-13 ENCOUNTER — Other Ambulatory Visit (HOSPITAL_COMMUNITY): Payer: Self-pay

## 2023-10-13 ENCOUNTER — Other Ambulatory Visit: Payer: Self-pay

## 2023-10-13 NOTE — Progress Notes (Signed)
 Medication was processed on 7/9 for pickup on 7/10. Rejected for refill too soon and cancelled by outpatient technician. Patient called today asking about his refill. Told patient that an error occurred and that medication would be ready for pick up this afternoon. Patient was understanding and will pick up after 4:00 or first thing Monday morning.

## 2023-10-23 ENCOUNTER — Encounter: Payer: Self-pay | Admitting: Podiatry

## 2023-10-23 ENCOUNTER — Ambulatory Visit (INDEPENDENT_AMBULATORY_CARE_PROVIDER_SITE_OTHER): Admitting: Podiatry

## 2023-10-23 DIAGNOSIS — B351 Tinea unguium: Secondary | ICD-10-CM

## 2023-10-23 MED ORDER — CICLOPIROX 8 % EX SOLN: Freq: Every day | CUTANEOUS | 0 refills | Status: DC

## 2023-10-23 NOTE — Progress Notes (Signed)
  Subjective:  Patient ID: [name], male    DOB: February 12, 1946,   MRN: [id number]  Chief Complaint  Patient presents with   Nail Problem    Look at my toenails.    78 y.o. male presents for follow-up of fungal nails relates has not noticed much difference. He has been on fluconazole  and did not help. Had nails recultured. Denies any other pedal complaints. Denies n/v/f/c.   Past Medical History:  Diagnosis Date   Cholelithiases 07/16/2015   Complication of anesthesia 2007   quit breathing with bronchoscopy, had to spend night   History of agent Orange exposure 44-45 yrs ago   History of pneumonia  last 3-4 yrs ago   3 -4 different times   Hypercholesterolemia    Hypertension    Hypothyroidism    lung ca dx'd 2007   lung right   Sinus disease    hx of   Sleep apnea    uses cpap setting opf 12    Objective:  Physical Exam: Vascular: DP/PT pulses 2/4 bilateral. CFT <3 seconds. Normal hair growth on digits. No edema.  Skin. No lacerations or abrasions bilateral feet. Bilateral nails 1-5 except right fourth are thickened dystrophic and discolored with subungual debris. Not much improvement noted to nail plate.  Musculoskeletal: MMT 5/5 bilateral lower extremities in DF, PF, Inversion and Eversion. Deceased ROM in DF of ankle joint.  Neurological: Sensation intact to light touch.   Assessment:   No diagnosis found.       Plan:  Patient was evaluated and treated and all questions answered. -Examined patient -Discussed treatment options for painful dystrophic nails  -Discussed reculturing nail to see if any fungus still remiains.  -Culture growing t rubrum now and possible some other fungal elements.  -Discussed fungal nail treatment options including oral, topical, and laser treatments. Discussed nails and chemo and effects of nails including direct effect and more likely fungal infections. Would like to avoid oral medication. Discussed topicals Will continue on with  topicals for now as this is safer. Did discuss trying lamisil  but would like to hold off for now.  -Patient to return in 4 month for recheck.    Asberry Failing, DPM

## 2023-10-24 ENCOUNTER — Inpatient Hospital Stay: Attending: Internal Medicine

## 2023-10-24 ENCOUNTER — Inpatient Hospital Stay (HOSPITAL_BASED_OUTPATIENT_CLINIC_OR_DEPARTMENT_OTHER): Admitting: Internal Medicine

## 2023-10-24 VITALS — BP 137/75 | HR 71 | Temp 98.2°F | Resp 17 | Ht 72.0 in | Wt 232.0 lb

## 2023-10-24 DIAGNOSIS — D649 Anemia, unspecified: Secondary | ICD-10-CM | POA: Diagnosis not present

## 2023-10-24 DIAGNOSIS — C3431 Malignant neoplasm of lower lobe, right bronchus or lung: Secondary | ICD-10-CM

## 2023-10-24 DIAGNOSIS — C3490 Malignant neoplasm of unspecified part of unspecified bronchus or lung: Secondary | ICD-10-CM | POA: Diagnosis not present

## 2023-10-24 DIAGNOSIS — D696 Thrombocytopenia, unspecified: Secondary | ICD-10-CM | POA: Diagnosis not present

## 2023-10-24 LAB — CBC WITH DIFFERENTIAL (CANCER CENTER ONLY)
Abs Immature Granulocytes: 0.02 K/uL (ref 0.00–0.07)
Basophils Absolute: 0 K/uL (ref 0.0–0.1)
Basophils Relative: 0 %
Eosinophils Absolute: 0.1 K/uL (ref 0.0–0.5)
Eosinophils Relative: 4 %
HCT: 33.3 % — ABNORMAL LOW (ref 39.0–52.0)
Hemoglobin: 11.8 g/dL — ABNORMAL LOW (ref 13.0–17.0)
Immature Granulocytes: 1 %
Lymphocytes Relative: 39 %
Lymphs Abs: 1.5 K/uL (ref 0.7–4.0)
MCH: 35.2 pg — ABNORMAL HIGH (ref 26.0–34.0)
MCHC: 35.4 g/dL (ref 30.0–36.0)
MCV: 99.4 fL (ref 80.0–100.0)
Monocytes Absolute: 0.6 K/uL (ref 0.1–1.0)
Monocytes Relative: 14 %
Neutro Abs: 1.7 K/uL (ref 1.7–7.7)
Neutrophils Relative %: 42 %
Platelet Count: 120 K/uL — ABNORMAL LOW (ref 150–400)
RBC: 3.35 MIL/uL — ABNORMAL LOW (ref 4.22–5.81)
RDW: 12.6 % (ref 11.5–15.5)
WBC Count: 4 K/uL (ref 4.0–10.5)
nRBC: 0 % (ref 0.0–0.2)

## 2023-10-24 LAB — CMP (CANCER CENTER ONLY)
ALT: 30 U/L (ref 0–44)
AST: 33 U/L (ref 15–41)
Albumin: 4.1 g/dL (ref 3.5–5.0)
Alkaline Phosphatase: 62 U/L (ref 38–126)
Anion gap: 5 (ref 5–15)
BUN: 14 mg/dL (ref 8–23)
CO2: 31 mmol/L (ref 22–32)
Calcium: 9.3 mg/dL (ref 8.9–10.3)
Chloride: 100 mmol/L (ref 98–111)
Creatinine: 0.9 mg/dL (ref 0.61–1.24)
GFR, Estimated: >60 mL/min (ref >60)
Glucose, Bld: 97 mg/dL (ref 70–99)
Potassium: 3.6 mmol/L (ref 3.5–5.1)
Sodium: 136 mmol/L (ref 135–145)
Total Bilirubin: 0.5 mg/dL (ref 0.0–1.2)
Total Protein: 6.4 g/dL — ABNORMAL LOW (ref 6.5–8.1)

## 2023-10-24 NOTE — Progress Notes (Signed)
 [facility] Telephone:([phone]   Fax:([phone]  OFFICE PROGRESS NOTE  [name], MD 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  PRIOR THERAPY: 1) Status post right upper lobectomy under the care of Dr. Kerrin on 01/23/2006.  2) Tarceva  150 mg by mouth daily as part of the BMS checkmate 370 clinical trial. Status post 6 weeks of treatment. 3) Tarceva  100 mg by mouth daily as part of the BMS Checkmate 370 clinical trial. First dose started 07/17/2014 status post 101 months of treatment  CURRENT THERAPY: Tagrisso  80 mg p.o. daily started 12/28/2022.   INTERVAL HISTORY: [name] 78 y.o. male returns to the clinic today for follow-up visit. Discussed the use of AI scribe software for clinical note transcription with the patient, who gave verbal consent to proceed.  History of Present Illness [name] is a 78 year old male with recurrent non-small cell lung cancer who presents for evaluation and repeat blood work.  He has recurrent non-small cell lung cancer, adenocarcinoma with a positive EGFR mutation (Nexon 21, [id number]). He is currently on Tagrisso  80 mg once daily, which he started on December 28, 2022. He experiences increased sensitivity in the area of previous surgery and continues to walk almost daily despite the struggle.  No new or worsening symptoms such as cough, phlegm, or significant changes in shortness of breath. He acknowledges being short of breath but states it has not changed drastically. No nausea, vomiting, diarrhea, or rash from Tagrisso .  He mentions a previous issue with his nails, for which a lab was done and something new was found. He is considering taking Lamisil  but wants to ensure there are no interactions with his current  medications.     MEDICAL HISTORY: Past Medical History:  Diagnosis Date   Cholelithiases 07/16/2015   Complication of anesthesia 2007   quit breathing with bronchoscopy, had to spend night   History of agent Orange exposure 44-45 yrs ago   History of pneumonia  last 3-4 yrs ago   3 -4 different times   Hypercholesterolemia    Hypertension    Hypothyroidism    lung ca dx'd 2007   lung right   Sinus disease    hx of   Sleep apnea    uses cpap setting opf 12    ALLERGIES:  is allergic to lisinopril, prednisone, and furosemide .  MEDICATIONS:  Current Outpatient Medications  Medication Sig Dispense Refill   amitriptyline (ELAVIL) 50 MG tablet Take 50 mg by mouth at bedtime.     amLODipine  (NORVASC ) 10 MG tablet Take 10 mg by mouth daily.     aspirin 81 MG tablet Take 81 mg by mouth every morning.      Cholecalciferol 2000 units CAPS Take 2,000 Units by mouth daily.      ciclopirox  (PENLAC ) 8 % solution Apply topically at bedtime. Apply over nail and surrounding skin. Apply daily over previous coat. After seven (7) days, may remove with alcohol and continue cycle. 6.6 mL 0   levETIRAcetam (KEPPRA) 750 MG tablet Take 750 mg by mouth at bedtime.      metoprolol  succinate (TOPROL -XL) 25 MG 24 hr tablet Take 1 tablet (25 mg total) by mouth daily. 90 tablet 3   Multiple Vitamins-Minerals (CENTRUM SILVER PO) Take 1 tablet  by mouth daily.      nitroGLYCERIN  (NITROSTAT ) 0.4 MG SL tablet Place 1 tablet (0.4 mg total) under the tongue every 5 (five) minutes as needed for chest pain. 25 tablet 4   osimertinib  mesylate (TAGRISSO ) 80 MG tablet Take 1 tablet (80 mg total) by mouth daily. 30 tablet 3   polyethylene glycol (MIRALAX / GLYCOLAX) packet Take 17 g by mouth daily. Reported on 08/27/2015     psyllium (METAMUCIL) 58.6 % powder Take 1 packet by mouth daily. Reported on 08/27/2015     rosuvastatin (CRESTOR) 5 MG tablet Take 5 mg by mouth every morning.      SYNTHROID 112 MCG tablet Take 112  mcg by mouth daily.     valsartan-hydrochlorothiazide (DIOVAN-HCT) 320-25 MG per tablet Take 1 tablet by mouth every morning.      No current facility-administered medications for this visit.    SURGICAL HISTORY:  Past Surgical History:  Procedure Laterality Date   BIOPSY  05/21/2018   Procedure: BIOPSY;  Surgeon: Lennard Lesta FALCON, MD;  Location: [facility];  Service: Endoscopy;;   BIOPSY  05/11/2021   Procedure: BIOPSY;  Surgeon: Elicia Claw, MD;  Location: [facility];  Service: Gastroenterology;;   COLONOSCOPY WITH PROPOFOL  N/A 03/19/2013   Procedure: COLONOSCOPY WITH PROPOFOL ;  Surgeon: Gladis MARLA Louder, MD;  Location: [facility];  Service: Endoscopy;  Laterality: N/A;   COLONOSCOPY WITH PROPOFOL  N/A 05/11/2021   Procedure: COLONOSCOPY WITH PROPOFOL ;  Surgeon: Elicia Claw, MD;  Location: [facility];  Service: Gastroenterology;  Laterality: N/A;   Cystourethroscopy, [facility] TURP.  04/16/2010   ESOPHAGOGASTRODUODENOSCOPY (EGD) WITH PROPOFOL  N/A 05/21/2018   Procedure: ESOPHAGOGASTRODUODENOSCOPY (EGD) WITH PROPOFOL ;  Surgeon: Lennard Lesta FALCON, MD;  Location: [facility];  Service: Endoscopy;  Laterality: N/A;   LASER ABLATION Right 01/26/2023   Laser ablation of right greater saphenous vein with >20 stab phlebectomies   LASER ABLATION Left 06/15/2023   Laser ablation of left greater saphenous vein   NASAL SINUS SURGERY  04/04/1985   POLYPECTOMY  05/11/2021   Procedure: POLYPECTOMY;  Surgeon: Elicia Claw, MD;  Location: [facility];  Service: Gastroenterology;;   Right video-assisted thoracoscopy, right upper lobectomy and  01/23/2006   The Olympus video bronchoscope was introduced via the right  12/21/2005   TONSILLECTOMY  04/05/1955   Torn medial and lateral menisci, left knee.  11/06/2006    REVIEW OF SYSTEMS:  A comprehensive review of systems was negative except for: Constitutional: positive for fatigue Respiratory: positive for dyspnea on exertion and  pleurisy/chest pain   PHYSICAL EXAMINATION: General appearance: alert, cooperative, fatigued, and no distress Head: Normocephalic, without obvious abnormality, atraumatic Neck: no adenopathy, no JVD, supple, symmetrical, trachea midline, and thyroid  not enlarged, symmetric, no tenderness/mass/nodules Lymph nodes: Cervical, supraclavicular, and axillary nodes normal. Resp: clear to auscultation bilaterally Back: symmetric, no curvature. ROM normal. No CVA tenderness. Cardio: regular rate and rhythm, S1, S2 normal, no murmur, click, rub or gallop GI: soft, non-tender; bowel sounds normal; no masses,  no organomegaly Extremities: extremities normal, atraumatic, no cyanosis or edema  ECOG PERFORMANCE STATUS: 1 - Symptomatic but completely ambulatory  Blood pressure 137/75, pulse 71, temperature 98.2 F (36.8 C), temperature source Temporal, resp. rate 17, height 6' (1.829 m), weight 232 lb (105.2 kg), SpO2 99%.  LABORATORY DATA: Lab Results  Component Value Date   WBC 4.0 10/24/2023   HGB 11.8 (L) 10/24/2023   HCT 33.3 (L) 10/24/2023   MCV 99.4 10/24/2023   PLT 120 (L) 10/24/2023  Chemistry      Component Value Date/Time   NA 136 10/24/2023 0829   NA 141 03/11/2019 0959   NA 139 04/06/2017 0932   K 3.6 10/24/2023 0829   K 3.8 04/06/2017 0932   CL 100 10/24/2023 0829   CL 102 04/24/2012 0853   CO2 31 10/24/2023 0829   CO2 29 04/06/2017 0932   BUN 14 10/24/2023 0829   BUN 18 03/11/2019 0959   BUN 14.4 04/06/2017 0932   CREATININE 0.90 10/24/2023 0829   CREATININE 1.0 04/06/2017 0932      Component Value Date/Time   CALCIUM 9.3 10/24/2023 0829   CALCIUM 9.5 04/06/2017 0932   ALKPHOS 62 10/24/2023 0829   ALKPHOS 58 04/06/2017 0932   AST 33 10/24/2023 0829   AST 27 04/06/2017 0932   ALT 30 10/24/2023 0829   ALT 27 04/06/2017 0932   BILITOT 0.5 10/24/2023 0829   BILITOT 0.45 04/06/2017 0932       RADIOGRAPHIC STUDIES: No results found.    ASSESSMENT AND  PLAN:  This is a very pleasant 78 years old white male with recurrent non-small cell lung cancer, adenocarcinoma and positive EGFR mutation in exon 21. He is currently on Tarceva  100 mg by mouth daily status post around 101 months of treatment. The patient had evidence for disease progression and he is switching to treatment with Tagrisso  80 mg p.o. daily on December 28, 2022.  He has been tolerating this treatment fairly well. Assessment and Plan Assessment & Plan Recurrent non-small cell lung cancer, adenocarcinoma with EGFR mutation Recurrent adenocarcinoma with positive EGFR mutation (Nexon 21, [id number]). Currently on Tagrisso  80 mg daily since December 28, 2022. No new respiratory symptoms such as cough, phlegm, or increased dyspnea. No side effects from Tagrisso  such as rash, nausea, vomiting, or diarrhea. - Continue Tagrisso  80 mg daily - Order scan in the first week of October - Schedule follow-up appointment one week after the scan  Mild anemia and thrombocytopenia Mild anemia and thrombocytopenia noted on blood work, consistent with previous results. No new symptoms reported.  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 Symptoms are well-managed and have been present for many years. He was advised to call immediately if he has any concerning symptoms in the interval. The patient voices understanding of current disease status and treatment options and is in agreement with the current care plan. All questions were answered. The patient knows to call the clinic with any problems, questions or concerns. We can certainly see the patient much sooner if necessary. The total time spent in the appointment was 20 minutes.  Disclaimer: This note was dictated with voice recognition software. Similar sounding words can inadvertently be transcribed and may not be corrected upon review.

## 2023-10-25 ENCOUNTER — Telehealth: Payer: Self-pay | Admitting: Podiatry

## 2023-10-25 ENCOUNTER — Other Ambulatory Visit: Payer: Self-pay | Admitting: Podiatry

## 2023-10-25 MED ORDER — TERBINAFINE HCL 250 MG PO TABS: 250.0000 mg | ORAL_TABLET | Freq: Every day | ORAL | 2 refills | Status: AC

## 2023-10-25 NOTE — Telephone Encounter (Signed)
 Patient states he checked with the cancer center and it is okay for him to use the Lamisil . Please send script to  [facility] [id number] [place], [place] - [address] Phone: [phone]  Fax: [phone]

## 2023-11-02 ENCOUNTER — Other Ambulatory Visit: Payer: Self-pay

## 2023-11-02 DIAGNOSIS — J439 Emphysema, unspecified: Secondary | ICD-10-CM | POA: Diagnosis not present

## 2023-11-02 DIAGNOSIS — E785 Hyperlipidemia, unspecified: Secondary | ICD-10-CM | POA: Diagnosis not present

## 2023-11-02 DIAGNOSIS — I700 Atherosclerosis of aorta: Secondary | ICD-10-CM | POA: Diagnosis not present

## 2023-11-02 DIAGNOSIS — N400 Enlarged prostate without lower urinary tract symptoms: Secondary | ICD-10-CM | POA: Diagnosis not present

## 2023-11-02 DIAGNOSIS — C3490 Malignant neoplasm of unspecified part of unspecified bronchus or lung: Secondary | ICD-10-CM | POA: Diagnosis not present

## 2023-11-02 DIAGNOSIS — I10 Essential (primary) hypertension: Secondary | ICD-10-CM | POA: Diagnosis not present

## 2023-11-07 ENCOUNTER — Other Ambulatory Visit: Payer: Self-pay

## 2023-11-08 DIAGNOSIS — J439 Emphysema, unspecified: Secondary | ICD-10-CM | POA: Diagnosis not present

## 2023-11-08 DIAGNOSIS — I10 Essential (primary) hypertension: Secondary | ICD-10-CM | POA: Diagnosis not present

## 2023-11-08 DIAGNOSIS — I700 Atherosclerosis of aorta: Secondary | ICD-10-CM | POA: Diagnosis not present

## 2023-11-09 ENCOUNTER — Other Ambulatory Visit: Payer: Self-pay

## 2023-11-09 NOTE — Progress Notes (Signed)
 Specialty Pharmacy Refill Coordination Note  [name] is a 78 y.o. male contacted today regarding refills of specialty medication(s) Osimertinib  Mesylate (TAGRISSO )   Patient requested [name] at [facility] at [place] date: 11/13/23   Medication will be filled on 11/10/23.

## 2023-11-20 ENCOUNTER — Telehealth: Payer: Self-pay

## 2023-11-20 ENCOUNTER — Other Ambulatory Visit: Payer: Self-pay

## 2023-11-20 DIAGNOSIS — I872 Venous insufficiency (chronic) (peripheral): Secondary | ICD-10-CM

## 2023-11-20 NOTE — Telephone Encounter (Addendum)
 Patient called to report left leg pain 2/10, swelling, itching and burning that started this weekend.    Pt scheduled for reflux study 11/21/2023 with MD visit scheduled 02/05/2024.

## 2023-11-21 ENCOUNTER — Ambulatory Visit (HOSPITAL_COMMUNITY)
Admission: RE | Admit: 2023-11-21 | Discharge: 2023-11-21 | Disposition: A | Discharge status: Home / Self Care | Source: Ambulatory Visit | Attending: Vascular Surgery | Admitting: Vascular Surgery

## 2023-11-21 DIAGNOSIS — I872 Venous insufficiency (chronic) (peripheral): Secondary | ICD-10-CM | POA: Diagnosis not present

## 2023-12-01 ENCOUNTER — Other Ambulatory Visit: Payer: Self-pay

## 2023-12-01 ENCOUNTER — Other Ambulatory Visit: Payer: Self-pay | Admitting: Internal Medicine

## 2023-12-01 DIAGNOSIS — C3431 Malignant neoplasm of lower lobe, right bronchus or lung: Secondary | ICD-10-CM

## 2023-12-03 ENCOUNTER — Encounter (INDEPENDENT_AMBULATORY_CARE_PROVIDER_SITE_OTHER): Payer: Self-pay

## 2023-12-03 DIAGNOSIS — I700 Atherosclerosis of aorta: Secondary | ICD-10-CM | POA: Diagnosis not present

## 2023-12-03 DIAGNOSIS — E785 Hyperlipidemia, unspecified: Secondary | ICD-10-CM | POA: Diagnosis not present

## 2023-12-03 DIAGNOSIS — I10 Essential (primary) hypertension: Secondary | ICD-10-CM | POA: Diagnosis not present

## 2023-12-03 DIAGNOSIS — J439 Emphysema, unspecified: Secondary | ICD-10-CM | POA: Diagnosis not present

## 2023-12-03 DIAGNOSIS — C3490 Malignant neoplasm of unspecified part of unspecified bronchus or lung: Secondary | ICD-10-CM | POA: Diagnosis not present

## 2023-12-03 DIAGNOSIS — N400 Enlarged prostate without lower urinary tract symptoms: Secondary | ICD-10-CM | POA: Diagnosis not present

## 2023-12-04 MED ORDER — OSIMERTINIB MESYLATE 80 MG PO TABS
80.0000 mg | ORAL_TABLET | Freq: Every day | ORAL | 3 refills | Status: DC
  Filled 2023-12-05 (×2): qty 30, 30d supply, fill #0
  Filled 2024-01-08: qty 30, 30d supply, fill #1
  Filled 2024-02-01: qty 30, 30d supply, fill #2
  Filled 2024-03-08: qty 30, 30d supply, fill #3

## 2023-12-05 ENCOUNTER — Other Ambulatory Visit: Payer: Self-pay | Admitting: Pharmacy Technician

## 2023-12-05 ENCOUNTER — Other Ambulatory Visit: Payer: Self-pay

## 2023-12-05 NOTE — Progress Notes (Signed)
 Specialty Pharmacy Refill Coordination Note  [name] is a 78 y.o. male contacted today regarding refills of specialty medication(s) Osimertinib  Mesylate (TAGRISSO )   Patient requested [name] at [facility] at [place] date: 12/14/23  Medication will be filled on 12/13/23.   Patient answered questionnaire

## 2023-12-07 ENCOUNTER — Other Ambulatory Visit: Payer: Self-pay

## 2023-12-08 DIAGNOSIS — J439 Emphysema, unspecified: Secondary | ICD-10-CM | POA: Diagnosis not present

## 2023-12-08 DIAGNOSIS — I700 Atherosclerosis of aorta: Secondary | ICD-10-CM | POA: Diagnosis not present

## 2023-12-08 DIAGNOSIS — I10 Essential (primary) hypertension: Secondary | ICD-10-CM | POA: Diagnosis not present

## 2023-12-13 ENCOUNTER — Other Ambulatory Visit: Payer: Self-pay

## 2023-12-16 ENCOUNTER — Other Ambulatory Visit: Payer: Self-pay | Admitting: Podiatry

## 2024-01-02 DIAGNOSIS — C3490 Malignant neoplasm of unspecified part of unspecified bronchus or lung: Secondary | ICD-10-CM | POA: Diagnosis not present

## 2024-01-02 DIAGNOSIS — J439 Emphysema, unspecified: Secondary | ICD-10-CM | POA: Diagnosis not present

## 2024-01-02 DIAGNOSIS — I10 Essential (primary) hypertension: Secondary | ICD-10-CM | POA: Diagnosis not present

## 2024-01-02 DIAGNOSIS — I700 Atherosclerosis of aorta: Secondary | ICD-10-CM | POA: Diagnosis not present

## 2024-01-02 DIAGNOSIS — N400 Enlarged prostate without lower urinary tract symptoms: Secondary | ICD-10-CM | POA: Diagnosis not present

## 2024-01-02 DIAGNOSIS — E785 Hyperlipidemia, unspecified: Secondary | ICD-10-CM | POA: Diagnosis not present

## 2024-01-05 DIAGNOSIS — Z1331 Encounter for screening for depression: Secondary | ICD-10-CM | POA: Diagnosis not present

## 2024-01-05 DIAGNOSIS — E559 Vitamin D deficiency, unspecified: Secondary | ICD-10-CM | POA: Diagnosis not present

## 2024-01-05 DIAGNOSIS — E039 Hypothyroidism, unspecified: Secondary | ICD-10-CM | POA: Diagnosis not present

## 2024-01-05 DIAGNOSIS — N400 Enlarged prostate without lower urinary tract symptoms: Secondary | ICD-10-CM | POA: Diagnosis not present

## 2024-01-05 DIAGNOSIS — Z0000 Encounter for general adult medical examination without abnormal findings: Secondary | ICD-10-CM | POA: Diagnosis not present

## 2024-01-05 DIAGNOSIS — J439 Emphysema, unspecified: Secondary | ICD-10-CM | POA: Diagnosis not present

## 2024-01-05 DIAGNOSIS — R7303 Prediabetes: Secondary | ICD-10-CM | POA: Diagnosis not present

## 2024-01-05 DIAGNOSIS — E785 Hyperlipidemia, unspecified: Secondary | ICD-10-CM | POA: Diagnosis not present

## 2024-01-05 DIAGNOSIS — Z23 Encounter for immunization: Secondary | ICD-10-CM | POA: Diagnosis not present

## 2024-01-05 DIAGNOSIS — I10 Essential (primary) hypertension: Secondary | ICD-10-CM | POA: Diagnosis not present

## 2024-01-05 DIAGNOSIS — I700 Atherosclerosis of aorta: Secondary | ICD-10-CM | POA: Diagnosis not present

## 2024-01-05 DIAGNOSIS — G43909 Migraine, unspecified, not intractable, without status migrainosus: Secondary | ICD-10-CM | POA: Diagnosis not present

## 2024-01-07 DIAGNOSIS — I700 Atherosclerosis of aorta: Secondary | ICD-10-CM | POA: Diagnosis not present

## 2024-01-07 DIAGNOSIS — I10 Essential (primary) hypertension: Secondary | ICD-10-CM | POA: Diagnosis not present

## 2024-01-07 DIAGNOSIS — J439 Emphysema, unspecified: Secondary | ICD-10-CM | POA: Diagnosis not present

## 2024-01-08 ENCOUNTER — Inpatient Hospital Stay: Attending: Internal Medicine

## 2024-01-08 ENCOUNTER — Other Ambulatory Visit: Payer: Self-pay

## 2024-01-08 DIAGNOSIS — R0781 Pleurodynia: Secondary | ICD-10-CM | POA: Diagnosis not present

## 2024-01-08 DIAGNOSIS — G8929 Other chronic pain: Secondary | ICD-10-CM | POA: Insufficient documentation

## 2024-01-08 DIAGNOSIS — Z79899 Other long term (current) drug therapy: Secondary | ICD-10-CM | POA: Insufficient documentation

## 2024-01-08 DIAGNOSIS — Z902 Acquired absence of lung [part of]: Secondary | ICD-10-CM | POA: Insufficient documentation

## 2024-01-08 DIAGNOSIS — C3490 Malignant neoplasm of unspecified part of unspecified bronchus or lung: Secondary | ICD-10-CM | POA: Insufficient documentation

## 2024-01-08 DIAGNOSIS — R0789 Other chest pain: Secondary | ICD-10-CM | POA: Diagnosis not present

## 2024-01-08 LAB — CBC WITH DIFFERENTIAL (CANCER CENTER ONLY)
Abs Immature Granulocytes: 0.02 K/uL (ref 0.00–0.07)
Basophils Absolute: 0 K/uL (ref 0.0–0.1)
Basophils Relative: 0 %
Eosinophils Absolute: 0.1 K/uL (ref 0.0–0.5)
Eosinophils Relative: 1 %
HCT: 32.7 % — ABNORMAL LOW (ref 39.0–52.0)
Hemoglobin: 11.1 g/dL — ABNORMAL LOW (ref 13.0–17.0)
Immature Granulocytes: 0 %
Lymphocytes Relative: 27 %
Lymphs Abs: 2 K/uL (ref 0.7–4.0)
MCH: 34.5 pg — ABNORMAL HIGH (ref 26.0–34.0)
MCHC: 33.9 g/dL (ref 30.0–36.0)
MCV: 101.6 fL — ABNORMAL HIGH (ref 80.0–100.0)
Monocytes Absolute: 1.2 K/uL — ABNORMAL HIGH (ref 0.1–1.0)
Monocytes Relative: 16 %
Neutro Abs: 4 K/uL (ref 1.7–7.7)
Neutrophils Relative %: 56 %
Platelet Count: 98 K/uL — ABNORMAL LOW (ref 150–400)
RBC: 3.22 MIL/uL — ABNORMAL LOW (ref 4.22–5.81)
RDW: 12.7 % (ref 11.5–15.5)
WBC Count: 7.2 K/uL (ref 4.0–10.5)
nRBC: 0 % (ref 0.0–0.2)

## 2024-01-08 LAB — CMP (CANCER CENTER ONLY)
ALT: 22 U/L (ref 0–44)
AST: 26 U/L (ref 15–41)
Albumin: 4.1 g/dL (ref 3.5–5.0)
Alkaline Phosphatase: 66 U/L (ref 38–126)
Anion gap: 4 — ABNORMAL LOW (ref 5–15)
BUN: 16 mg/dL (ref 8–23)
CO2: 33 mmol/L — ABNORMAL HIGH (ref 22–32)
Calcium: 9.7 mg/dL (ref 8.9–10.3)
Chloride: 101 mmol/L (ref 98–111)
Creatinine: 1.15 mg/dL (ref 0.61–1.24)
GFR, Estimated: >60 mL/min (ref >60)
Glucose, Bld: 104 mg/dL — ABNORMAL HIGH (ref 70–99)
Potassium: 3.8 mmol/L (ref 3.5–5.1)
Sodium: 138 mmol/L (ref 135–145)
Total Bilirubin: 0.5 mg/dL (ref 0.0–1.2)
Total Protein: 6.5 g/dL (ref 6.5–8.1)

## 2024-01-08 NOTE — Progress Notes (Signed)
 Specialty Pharmacy Refill Coordination Note  [name] is a 78 y.o. male contacted today regarding refills of specialty medication(s) Osimertinib  Mesylate (TAGRISSO )   Patient requested [name] at [facility] at [place] date: 01/10/24   Medication will be filled on 01/09/24.

## 2024-01-09 ENCOUNTER — Ambulatory Visit (HOSPITAL_COMMUNITY)
Admission: RE | Admit: 2024-01-09 | Discharge: 2024-01-09 | Disposition: A | Discharge status: Home / Self Care | Source: Ambulatory Visit | Attending: Internal Medicine | Admitting: Internal Medicine

## 2024-01-09 DIAGNOSIS — K8020 Calculus of gallbladder without cholecystitis without obstruction: Secondary | ICD-10-CM | POA: Diagnosis not present

## 2024-01-09 DIAGNOSIS — C3490 Malignant neoplasm of unspecified part of unspecified bronchus or lung: Secondary | ICD-10-CM | POA: Diagnosis not present

## 2024-01-09 DIAGNOSIS — K5730 Diverticulosis of large intestine without perforation or abscess without bleeding: Secondary | ICD-10-CM | POA: Diagnosis not present

## 2024-01-09 DIAGNOSIS — I700 Atherosclerosis of aorta: Secondary | ICD-10-CM | POA: Diagnosis not present

## 2024-01-09 MED ORDER — SODIUM CHLORIDE (PF) 0.9 % IJ SOLN
INTRAMUSCULAR | Status: AC
  Filled 2024-01-09: qty 50

## 2024-01-09 MED ORDER — IOHEXOL 300 MG/ML  SOLN
100.0000 mL | Freq: Once | INTRAMUSCULAR | Status: AC | PRN
Start: 2024-01-09 — End: 2024-01-09
  Administered 2024-01-09: 100 mL via INTRAVENOUS

## 2024-01-16 ENCOUNTER — Inpatient Hospital Stay: Admitting: Internal Medicine

## 2024-01-16 VITALS — BP 132/84 | HR 66 | Temp 97.8°F | Resp 17 | Ht 72.0 in | Wt 236.0 lb

## 2024-01-16 DIAGNOSIS — R0781 Pleurodynia: Secondary | ICD-10-CM | POA: Diagnosis not present

## 2024-01-16 DIAGNOSIS — C3490 Malignant neoplasm of unspecified part of unspecified bronchus or lung: Secondary | ICD-10-CM

## 2024-01-16 DIAGNOSIS — Z902 Acquired absence of lung [part of]: Secondary | ICD-10-CM | POA: Diagnosis not present

## 2024-01-16 DIAGNOSIS — Z79899 Other long term (current) drug therapy: Secondary | ICD-10-CM | POA: Diagnosis not present

## 2024-01-16 DIAGNOSIS — R0789 Other chest pain: Secondary | ICD-10-CM | POA: Diagnosis not present

## 2024-01-16 DIAGNOSIS — G8929 Other chronic pain: Secondary | ICD-10-CM | POA: Diagnosis not present

## 2024-01-16 NOTE — Progress Notes (Signed)
 [facility] [phone]   [phone]  OFFICE PROGRESS NOTE  [name], MD 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  PRIOR THERAPY: 1) Status post right upper lobectomy under the care of Dr. Kerrin on 01/23/2006.  2) Tarceva  150 mg by mouth daily as part of the BMS checkmate 370 clinical trial. Status post 6 weeks of treatment. 3) Tarceva  100 mg by mouth daily as part of the BMS Checkmate 370 clinical trial. First dose started 07/17/2014 status post 101 months of treatment  CURRENT THERAPY: Tagrisso  80 mg p.o. daily started 12/28/2022.   INTERVAL HISTORY: [name] 78 y.o. male returns to the clinic today for follow-up visit. Discussed the use of AI scribe software for clinical note transcription with the patient, who gave verbal consent to proceed.  History of Present Illness [name] is a 78 year old male with recurrent non-small cell lung cancer who presents for evaluation with repeat CT scan for restaging of his disease.  He has a history of recurrent non-small cell lung cancer, initially diagnosed as stage 1A in September 2007. He underwent a right upper lobectomy in October 2007. Post-surgery, he was treated with Tarceva  for over 101 months, which was later discontinued. In September 2020, his treatment was switched to Tagrisso , 80 mg per day.  He experiences a 'little bit of pain' mostly on the right side under the lower rib cage. No unusual shortness of breath, cough, nausea, vomiting, rash from Tarceva , or diarrhea.    MEDICAL HISTORY: Past Medical History:  Diagnosis Date   Cholelithiases 07/16/2015   Complication of anesthesia 2007   quit breathing with bronchoscopy, had to spend night   History of agent Orange exposure 44-45  yrs ago   History of pneumonia  last 3-4 yrs ago   3 -4 different times   Hypercholesterolemia    Hypertension    Hypothyroidism    lung ca dx'd 2007   lung right   Sinus disease    hx of   Sleep apnea    uses cpap setting opf 12    ALLERGIES:  is allergic to lisinopril, prednisone, and furosemide .  MEDICATIONS:  Current Outpatient Medications  Medication Sig Dispense Refill   terbinafine  (LAMISIL ) 250 MG tablet Take 1 tablet (250 mg total) by mouth daily. 30 tablet 2   amitriptyline (ELAVIL) 50 MG tablet Take 50 mg by mouth at bedtime.     amLODipine  (NORVASC ) 10 MG tablet Take 10 mg by mouth daily.     aspirin 81 MG tablet Take 81 mg by mouth every morning.      Cholecalciferol 2000 units CAPS Take 2,000 Units by mouth daily.      ciclopirox  (PENLAC ) 8 % solution Apply topically at bedtime. Apply over nail and surrounding skin. Apply daily over previous coat. After seven (7) days, may remove with alcohol and continue cycle. 6.6 mL 0   fluconazole  (DIFLUCAN ) 150 MG tablet TAKE 1 TABLET BY MOUTH ONCE WEEKLY 8 tablet 0   levETIRAcetam (KEPPRA) 750 MG tablet Take 750 mg by mouth at bedtime.      metoprolol  succinate (TOPROL -XL) 25 MG 24 hr tablet Take 1 tablet (25 mg total) by mouth daily. 90 tablet 3   Multiple Vitamins-Minerals (CENTRUM SILVER PO) Take 1 tablet by  mouth daily.      nitroGLYCERIN  (NITROSTAT ) 0.4 MG SL tablet Place 1 tablet (0.4 mg total) under the tongue every 5 (five) minutes as needed for chest pain. 25 tablet 4   osimertinib  mesylate (TAGRISSO ) 80 MG tablet Take 1 tablet (80 mg total) by mouth daily. 30 tablet 3   polyethylene glycol (MIRALAX / GLYCOLAX) packet Take 17 g by mouth daily. Reported on 08/27/2015     psyllium (METAMUCIL) 58.6 % powder Take 1 packet by mouth daily. Reported on 08/27/2015     rosuvastatin (CRESTOR) 5 MG tablet Take 5 mg by mouth every morning.      SYNTHROID 112 MCG tablet Take 112 mcg by mouth daily.     valsartan-hydrochlorothiazide  (DIOVAN-HCT) 320-25 MG per tablet Take 1 tablet by mouth every morning.      No current facility-administered medications for this visit.    SURGICAL HISTORY:  Past Surgical History:  Procedure Laterality Date   BIOPSY  05/21/2018   Procedure: BIOPSY;  Surgeon: Lennard Lesta FALCON, MD;  Location: [facility];  Service: Endoscopy;;   BIOPSY  05/11/2021   Procedure: BIOPSY;  Surgeon: Elicia Claw, MD;  Location: [facility];  Service: Gastroenterology;;   COLONOSCOPY WITH PROPOFOL  N/A 03/19/2013   Procedure: COLONOSCOPY WITH PROPOFOL ;  Surgeon: Gladis MARLA Louder, MD;  Location: [facility];  Service: Endoscopy;  Laterality: N/A;   COLONOSCOPY WITH PROPOFOL  N/A 05/11/2021   Procedure: COLONOSCOPY WITH PROPOFOL ;  Surgeon: Elicia Claw, MD;  Location: [facility];  Service: Gastroenterology;  Laterality: N/A;   Cystourethroscopy, [facility] TURP.  04/16/2010   ESOPHAGOGASTRODUODENOSCOPY (EGD) WITH PROPOFOL  N/A 05/21/2018   Procedure: ESOPHAGOGASTRODUODENOSCOPY (EGD) WITH PROPOFOL ;  Surgeon: Lennard Lesta FALCON, MD;  Location: [facility];  Service: Endoscopy;  Laterality: N/A;   LASER ABLATION Right 01/26/2023   Laser ablation of right greater saphenous vein with >20 stab phlebectomies   LASER ABLATION Left 06/15/2023   Laser ablation of left greater saphenous vein   NASAL SINUS SURGERY  04/04/1985   POLYPECTOMY  05/11/2021   Procedure: POLYPECTOMY;  Surgeon: Elicia Claw, MD;  Location: [facility];  Service: Gastroenterology;;   Right video-assisted thoracoscopy, right upper lobectomy and  01/23/2006   The Olympus video bronchoscope was introduced via the right  12/21/2005   TONSILLECTOMY  04/05/1955   Torn medial and lateral menisci, left knee.  11/06/2006    REVIEW OF SYSTEMS:  Constitutional: negative Eyes: negative Ears, nose, mouth, throat, and face: negative Respiratory: positive for pleurisy/chest pain Cardiovascular: negative Gastrointestinal:  negative Genitourinary:negative Integument/breast: negative Hematologic/lymphatic: negative Musculoskeletal:negative Neurological: negative Behavioral/Psych: negative Endocrine: negative Allergic/Immunologic: negative   PHYSICAL EXAMINATION: General appearance: alert, cooperative, fatigued, and no distress Head: Normocephalic, without obvious abnormality, atraumatic Neck: no adenopathy, no JVD, supple, symmetrical, trachea midline, and thyroid  not enlarged, symmetric, no tenderness/mass/nodules Lymph nodes: Cervical, supraclavicular, and axillary nodes normal. Resp: clear to auscultation bilaterally Back: symmetric, no curvature. ROM normal. No CVA tenderness. Cardio: regular rate and rhythm, S1, S2 normal, no murmur, click, rub or gallop GI: soft, non-tender; bowel sounds normal; no masses,  no organomegaly Extremities: extremities normal, atraumatic, no cyanosis or edema Neurologic: Alert and oriented X 3, normal strength and tone. Normal symmetric reflexes. Normal coordination and gait  ECOG PERFORMANCE STATUS: 1 - Symptomatic but completely ambulatory  Blood pressure 132/84, pulse 66, temperature 97.8 F (36.6 C), resp. rate 17, height 6' (1.829 m), weight 236 lb (107 kg), SpO2 96%.  LABORATORY DATA: Lab Results  Component Value Date   WBC 7.2 01/08/2024  HGB 11.1 (L) 01/08/2024   HCT 32.7 (L) 01/08/2024   MCV 101.6 (H) 01/08/2024   PLT 98 (L) 01/08/2024      Chemistry      Component Value Date/Time   NA 138 01/08/2024 0844   NA 141 03/11/2019 0959   NA 139 04/06/2017 0932   K 3.8 01/08/2024 0844   K 3.8 04/06/2017 0932   CL 101 01/08/2024 0844   CL 102 04/24/2012 0853   CO2 33 (H) 01/08/2024 0844   CO2 29 04/06/2017 0932   BUN 16 01/08/2024 0844   BUN 18 03/11/2019 0959   BUN 14.4 04/06/2017 0932   CREATININE 1.15 01/08/2024 0844   CREATININE 1.0 04/06/2017 0932      Component Value Date/Time   CALCIUM 9.7 01/08/2024 0844   CALCIUM 9.5 04/06/2017 0932    ALKPHOS 66 01/08/2024 0844   ALKPHOS 58 04/06/2017 0932   AST 26 01/08/2024 0844   AST 27 04/06/2017 0932   ALT 22 01/08/2024 0844   ALT 27 04/06/2017 0932   BILITOT 0.5 01/08/2024 0844   BILITOT 0.45 04/06/2017 0932       RADIOGRAPHIC STUDIES: CT CHEST ABDOMEN PELVIS W CONTRAST Result Date: 01/11/2024 CLINICAL DATA:  Non-small-cell lung cancer restaging * Tracking Code: BO * EXAM: CT CHEST, ABDOMEN, AND PELVIS WITH CONTRAST TECHNIQUE: Multidetector CT imaging of the chest, abdomen and pelvis was performed following the standard protocol during bolus administration of intravenous contrast. RADIATION DOSE REDUCTION: This exam was performed according to the departmental dose-optimization program which includes automated exposure control, adjustment of the mA and/or kV according to patient size and/or use of iterative reconstruction technique. CONTRAST:  OMNIPAQUE  IOHEXOL  300 MG/ML  SOLN COMPARISON:  07/18/2023 FINDINGS: CT CHEST FINDINGS Cardiovascular: Aortic atherosclerosis. Normal heart size. No pericardial effusion. Mediastinum/Nodes: No enlarged mediastinal, hilar, or axillary lymph nodes. Thyroid  gland, trachea, and esophagus demonstrate no significant findings. Lungs/Pleura: Status post right upper lobectomy. Slight enlargement of a soft tissue nodule of the superior posterior aspect of the right hilum, on today's examination measuring 3.7 x 2.6 cm, previously 3.3 x 1.9 cm when measured similarly (series 7, image 56). Additional bandlike scarring of the anterior right lower lobe. No pleural effusion or pneumothorax. Musculoskeletal: No chest wall abnormality. No acute osseous findings. CT ABDOMEN PELVIS FINDINGS Hepatobiliary: No solid liver abnormality is seen. Gallstone. No gallbladder wall thickening, or biliary dilatation. Pancreas: Unremarkable. No pancreatic ductal dilatation or surrounding inflammatory changes. Spleen: Normal in size without significant abnormality. Adrenals/Urinary  Tract: Adrenal glands are unremarkable. Kidneys are normal, without renal calculi, solid lesion, or hydronephrosis. Bladder is unremarkable. Stomach/Bowel: Stomach is within normal limits. Descending and sigmoid diverticulosis with a very redundant sigmoid colon. Long segment wall thickening and fat stranding about the diverticular mid to distal sigmoid colon (series 2, image 107). Large burden of stool throughout the colon. Vascular/Lymphatic: Aortic atherosclerosis. No enlarged abdominal or pelvic lymph nodes. Reproductive: No mass or other abnormality. Other: No abdominal wall hernia or abnormality. No ascites. Musculoskeletal: No acute osseous findings. IMPRESSION: 1. Status post right upper lobectomy. 2. Slight enlargement of a soft tissue nodule of the superior posterior aspect of the right hilum, on today's examination measuring 3.7 x 2.6 cm, previously 3.3 x 1.9 cm when measured similarly. This is highly concerning for recurrent disease and has clearly enlarged over a longer period of follow-up. PET-CT may be helpful to assess for metabolically active disease. 3. No evidence of lymphadenopathy or metastatic disease in the abdomen or pelvis. 4.  Descending and sigmoid diverticulosis with a very redundant sigmoid colon. Long segment wall thickening and fat stranding about the diverticular mid to distal sigmoid colon, consistent with acute diverticulitis. No evidence of complicating perforation or abscess. 5. Cholelithiasis. These results will be called to the ordering clinician or representative by the Radiologist Assistant, and communication documented in the PACS or [employer]. Aortic Atherosclerosis (ICD10-I70.0). Electronically Signed   By: Marolyn JONETTA Jaksch M.D.   On: 01/11/2024 17:28      ASSESSMENT AND PLAN:  This is a very pleasant 78 years old white male with recurrent non-small cell lung cancer, adenocarcinoma and positive EGFR mutation in exon 21. He is currently on Tarceva  100 mg by mouth  daily status post around 101 months of treatment. The patient had evidence for disease progression and he is switching to treatment with Tagrisso  80 mg p.o. daily on December 28, 2022.  He has been tolerating this treatment fairly well. He had repeat CT scan of the chest, abdomen and pelvis performed recently.  I personally independently reviewed the scan images and discussed the result with the patient today and showed him the images.  Her scan showed slight enlargement of the soft tissue nodule of the superior posterior aspect of the right hilum and currently measuring 3.7 x 2.6 cm compared to 3.3 x 1.9 cm. Assessment and Plan Assessment & Plan Recurrent EGFR-positive non-small cell lung cancer of the right hilum (L858R mutation) Recurrent non-small cell lung cancer with EGFR mutation (L858R) in the right hilum, showing slight enlargement of the soft tissue in the superior posterior aspect. Current measurements are 3.7 x 2.6 cm, previously 3.3 x 1.9 cm. No evidence of further spread or new growths. Differential diagnosis includes potential for a different pathology than lung cancer, necessitating further investigation. - Order PET scan to assess activity of the lesion in the right hilum. - Consider biopsy based on PET scan results to confirm diagnosis. - Continue current treatment with Tagrisso  80 mg daily. - Schedule follow-up appointment in three weeks to review PET scan results.  Chronic right lower rib cage pain Chronic pain localized to the right lower rib cage, mostly on the right side. No recent physical activity or trauma reported that could account for the pain. No associated symptoms such as shortness of breath, cough, nausea, vomiting, rash, or diarrhea. The patient was advised to call immediately if he has any concerning symptoms in the interval.  The patient voices understanding of current disease status and treatment options and is in agreement with the current care plan. All  questions were answered. The patient knows to call the clinic with any problems, questions or concerns. We can certainly see the patient much sooner if necessary. The total time spent in the appointment was 30 minutes.  Disclaimer: This note was dictated with voice recognition software. Similar sounding words can inadvertently be transcribed and may not be corrected upon review.

## 2024-01-18 DIAGNOSIS — D225 Melanocytic nevi of trunk: Secondary | ICD-10-CM | POA: Diagnosis not present

## 2024-01-18 DIAGNOSIS — Z1283 Encounter for screening for malignant neoplasm of skin: Secondary | ICD-10-CM | POA: Diagnosis not present

## 2024-01-18 DIAGNOSIS — L821 Other seborrheic keratosis: Secondary | ICD-10-CM | POA: Diagnosis not present

## 2024-01-26 ENCOUNTER — Encounter (HOSPITAL_COMMUNITY)
Admission: RE | Admit: 2024-01-26 | Discharge: 2024-01-26 | Disposition: A | Discharge status: Home / Self Care | Source: Ambulatory Visit | Attending: Internal Medicine | Admitting: Internal Medicine

## 2024-01-26 DIAGNOSIS — C3431 Malignant neoplasm of lower lobe, right bronchus or lung: Secondary | ICD-10-CM | POA: Diagnosis not present

## 2024-01-26 DIAGNOSIS — C3490 Malignant neoplasm of unspecified part of unspecified bronchus or lung: Secondary | ICD-10-CM | POA: Insufficient documentation

## 2024-01-26 DIAGNOSIS — R918 Other nonspecific abnormal finding of lung field: Secondary | ICD-10-CM | POA: Diagnosis not present

## 2024-01-26 LAB — GLUCOSE, CAPILLARY: Glucose-Capillary: 99 mg/dL (ref 70–99)

## 2024-01-26 MED ORDER — FLUDEOXYGLUCOSE F - 18 (FDG) INJECTION
11.8000 | Freq: Once | INTRAVENOUS | Status: AC | PRN
  Administered 2024-01-26: 11.77 via INTRAVENOUS

## 2024-01-30 ENCOUNTER — Encounter: Payer: Self-pay | Admitting: Thoracic Surgery (Cardiothoracic Vascular Surgery)

## 2024-01-30 ENCOUNTER — Ambulatory Visit
Attending: Thoracic Surgery (Cardiothoracic Vascular Surgery) | Admitting: Thoracic Surgery (Cardiothoracic Vascular Surgery)

## 2024-01-30 VITALS — BP 135/70 | HR 72 | Resp 18 | Ht 72.0 in | Wt 236.0 lb

## 2024-01-30 DIAGNOSIS — Z902 Acquired absence of lung [part of]: Secondary | ICD-10-CM | POA: Insufficient documentation

## 2024-01-30 DIAGNOSIS — C3431 Malignant neoplasm of lower lobe, right bronchus or lung: Secondary | ICD-10-CM | POA: Insufficient documentation

## 2024-01-30 NOTE — Progress Notes (Signed)
 [address], [place] [address]             [phone]     HPI: [name] returns for a scheduled follow-up visit regarding recurrent lung cancer  [name] is a 78 year old man with a past history of lung cancer, agent orange exposure, hypertension, hyperlipidemia, hypothyroidism, and sleep apnea.  He had stage Ia adenocarcinoma of the lung in 2007.  He underwent right upper lobectomy.  In 2017 he had a recurrence.  Biopsy showed adenocarcinoma positive for eGFR.  Treated with Tarceva  for many years but then developed progression and was switched to Tagrisso .  I last saw him in April 2025.  He was feeling well.  CT findings were stable.  He recently saw Dr. Sherrod.  His CT showed progression of a right hilar mass.  Then underwent PET/CT which showed the mass was hypermetabolic.  There was a smaller hypermetabolic right lower lobe nodule.  No adenopathy or distant metastatic disease.  He feels well.  He does not have any problems with chest pain, pressure, tightness, or shortness of breath.  No change in appetite or weight loss.  No headaches or visual changes.  Past Medical History:  Diagnosis Date   Cholelithiases 07/16/2015   Complication of anesthesia 2007   quit breathing with bronchoscopy, had to spend night   History of agent Orange exposure 44-45 yrs ago   History of pneumonia  last 3-4 yrs ago   3 -4 different times   Hypercholesterolemia    Hypertension    Hypothyroidism    lung ca dx'd 2007   lung right   Sinus disease    hx of   Sleep apnea    uses cpap setting opf 12   Past Surgical History:  Procedure Laterality Date   BIOPSY  05/21/2018   Procedure: BIOPSY;  Surgeon: Lennard Lesta FALCON, MD;  Location: [facility];  Service: Endoscopy;;   BIOPSY  05/11/2021   Procedure: BIOPSY;  Surgeon: Elicia Claw, MD;  Location: [facility];  Service: Gastroenterology;;   COLONOSCOPY WITH PROPOFOL  N/A 03/19/2013   Procedure: COLONOSCOPY  WITH PROPOFOL ;  Surgeon: Gladis MARLA Louder, MD;  Location: [facility];  Service: Endoscopy;  Laterality: N/A;   COLONOSCOPY WITH PROPOFOL  N/A 05/11/2021   Procedure: COLONOSCOPY WITH PROPOFOL ;  Surgeon: Elicia Claw, MD;  Location: [facility];  Service: Gastroenterology;  Laterality: N/A;   Cystourethroscopy, [facility] TURP.  04/16/2010   ESOPHAGOGASTRODUODENOSCOPY (EGD) WITH PROPOFOL  N/A 05/21/2018   Procedure: ESOPHAGOGASTRODUODENOSCOPY (EGD) WITH PROPOFOL ;  Surgeon: Lennard Lesta FALCON, MD;  Location: [facility];  Service: Endoscopy;  Laterality: N/A;   LASER ABLATION Right 01/26/2023   Laser ablation of right greater saphenous vein with >20 stab phlebectomies   LASER ABLATION Left 06/15/2023   Laser ablation of left greater saphenous vein   NASAL SINUS SURGERY  04/04/1985   POLYPECTOMY  05/11/2021   Procedure: POLYPECTOMY;  Surgeon: Elicia Claw, MD;  Location: [facility];  Service: Gastroenterology;;   Right video-assisted thoracoscopy, right upper lobectomy and  01/23/2006   The Olympus video bronchoscope was introduced via the right  12/21/2005   TONSILLECTOMY  04/05/1955   Torn medial and lateral menisci, left knee.  11/06/2006    Current Outpatient Medications  Medication Sig Dispense Refill   aspirin 81 MG tablet Take 81 mg by mouth every morning.  (Patient taking differently: Take 81 mg by mouth every other day.)     amitriptyline (ELAVIL) 50 MG tablet  Take 50 mg by mouth at bedtime.     amLODipine  (NORVASC ) 10 MG tablet Take 10 mg by mouth daily.     Cholecalciferol 2000 units CAPS Take 2,000 Units by mouth daily.      ciclopirox  (PENLAC ) 8 % solution Apply topically at bedtime. Apply over nail and surrounding skin. Apply daily over previous coat. After seven (7) days, may remove with alcohol and continue cycle. 6.6 mL 0   levETIRAcetam (KEPPRA) 750 MG tablet Take 750 mg by mouth at bedtime.      metoprolol  succinate (TOPROL -XL) 25 MG 24 hr tablet Take 1 tablet (25 mg  total) by mouth daily. 90 tablet 3   Multiple Vitamins-Minerals (CENTRUM SILVER PO) Take 1 tablet by mouth daily.      nitroGLYCERIN  (NITROSTAT ) 0.4 MG SL tablet Place 1 tablet (0.4 mg total) under the tongue every 5 (five) minutes as needed for chest pain. 25 tablet 4   osimertinib  mesylate (TAGRISSO ) 80 MG tablet Take 1 tablet (80 mg total) by mouth daily. 30 tablet 3   polyethylene glycol (MIRALAX / GLYCOLAX) packet Take 17 g by mouth daily. Reported on 08/27/2015     psyllium (METAMUCIL) 58.6 % powder Take 1 packet by mouth daily. Reported on 08/27/2015     rosuvastatin (CRESTOR) 5 MG tablet Take 5 mg by mouth every morning.      SYNTHROID 112 MCG tablet Take 112 mcg by mouth daily.     valsartan-hydrochlorothiazide (DIOVAN-HCT) 320-25 MG per tablet Take 1 tablet by mouth every morning.      No current facility-administered medications for this visit.    Physical Exam BP 135/70 (BP Location: Left Arm)   Pulse 72   Resp 18   Ht 6' (1.829 m)   Wt 236 lb (107 kg)   SpO2 96%   BMI 32.14 kg/m  78 year old man in no acute distress Alert and oriented x 3 with no focal deficits HEENT within normal limits Neck supple with no adenopathy Cardiac regular rate and rhythm, no murmur Lungs diminished to right base but otherwise clear No peripheral edema  Diagnostic Tests: PET AND CT SKULL BASE TO MID THIGH 01/26/2024 10:09:56 AM   TECHNIQUE:   RADIOPHARMACEUTICAL: 11.77 mCi F-18 FDG Uptake time 60 minutes. Glucose level 99 mg/dl.   PET imaging was acquired from the base of the skull to the mid thighs. Non-contrast enhanced computed tomography was obtained for attenuation correction and anatomic localization.   COMPARISON: CT 01/09/2024, CT 12/20/2022   CLINICAL HISTORY: Non-small cell lung cancer (NSCLC), staging. EOV; 11.77 mCi F18-FDG in RAC by SAW/ALM @ 0856; 99 FBG ; 2007 dx with nsclc; recurrence in 2015, new nodule seen on ct 01/05/24; Currently taking chemo pills    FINDINGS:   HEAD AND NECK: No metabolically active cervical lymphadenopathy.   CHEST: Rounded mass adjacent to the surgical site in the right lower lobe measures 3.8 x 3.1 cm on image 70. This mass is increased in size over time and has now intense metabolic activity with SUV maximal 10.3 on image 70. Small nodule in the right lower lobe measures 14 mm with SUV max equal 4.4 on image 70. No hypermetabolic mediastinal lymph nodes. No supraclavicular adenopathy.   ABDOMEN AND PELVIS: Irregular cystic lesion superior to the right kidney without metabolic activity. Gallstones noted. No metabolically active intraperitoneal mass. No metabolically active lymphadenopathy. Physiologic activity within the gastrointestinal and genitourinary systems.   BONES AND SOFT TISSUE: No abnormal FDG activity localizes to the bones. No  metabolically active aggressive osseous lesion.   IMPRESSION: 1. Enlarging hypermetabolic right lower lobe mass adjacent to the surgical site consistent with recurrent lung cancer 2. Hypermetabolic 14 mm right lower lobe pulmonary nodule suspicious for a second site of recurrence 3. No hypermetabolic mediastinal or subclavicular lymphadenopathy.   Electronically signed by: Norleen Boxer MD 01/28/2024 05:27 PM EDT RP Workstation: [id number] I personally reviewed the CT and PET/CT images.  There is a large central right lung mass that is hypermetabolic.  Abuts the trachea and right mainstem bronchus.  No mediastinal adenopathy.  Hypermetabolic 14 mm right lower lobe lung nodule.  Impression: [name] is a 78 year old man with a past history of lung cancer, agent orange exposure, hypertension, hyperlipidemia, hypothyroidism, and sleep apnea.  Recently had a stage Ia adenocarcinoma resected with lobectomy in 2007.  10 years later developed recurrence which was eGFR positive.  Was treated with Tarceva  initially and now Tagrisso .  Recent CT showed progression of disease  and PET/CT shows that disease is hypermetabolic.  This is a large central mass that abuts the right mainstem and trachea.  Does not appear to be resectable.  I discussed the findings with [name].  He has an appointment with Dr. Gatha next week.  If Dr. Gatha wants to pursue a biopsy, I can schedule him for bronchoscopy and endobronchial ultrasound.  I did discuss bronchoscopy and endobronchial ultrasound with [name].  He understands the procedure would be done in the OR under general anesthesia.  We would plan to do it on an outpatient basis.  There would be no incisions as it is purely endoscopic.  I informed him of the indications, risks, benefits, and alternatives.  He understands the risks include, but are not limited to those associated with general anesthesia as well as bleeding and pneumothorax.  He will meet with Dr. Sherrod and then let us  know if he wants to pursue a biopsy.  Plan: Follow-up with Dr. Sherrod Call to schedule bronchoscopy and endobronchial ultrasound  I spent over 30 minutes in review of records, images, and consultation with [name] today. Elspeth JAYSON Millers, MD [facility] [phone]

## 2024-01-30 NOTE — H&P (View-Only) (Signed)
 [address], [place] [name] [place] [address]             [id number]     HPI: [name] returns for a scheduled follow-up visit regarding recurrent lung cancer  [name] is a 78 year old man with a past history of lung cancer, agent orange exposure, hypertension, hyperlipidemia, hypothyroidism, and sleep apnea.  He had stage Ia adenocarcinoma of the lung in 2007.  He underwent right upper lobectomy.  In 2017 he had a recurrence.  Biopsy showed adenocarcinoma positive for eGFR.  Treated with Tarceva  for many years but then developed progression and was switched to Tagrisso .  I last saw him in April 2025.  He was feeling well.  CT findings were stable.  He recently saw Dr. Sherrod.  His CT showed progression of a right hilar mass.  Then underwent PET/CT which showed the mass was hypermetabolic.  There was a smaller hypermetabolic right lower lobe nodule.  No adenopathy or distant metastatic disease.  He feels well.  He does not have any problems with chest pain, pressure, tightness, or shortness of breath.  No change in appetite or weight loss.  No headaches or visual changes.  Past Medical History:  Diagnosis Date   Cholelithiases 07/16/2015   Complication of anesthesia 2007   quit breathing with bronchoscopy, had to spend night   History of agent Orange exposure 44-45 yrs ago   History of pneumonia  last 3-4 yrs ago   3 -4 different times   Hypercholesterolemia    Hypertension    Hypothyroidism    lung ca dx'd 2007   lung right   Sinus disease    hx of   Sleep apnea    uses cpap setting opf 12   Past Surgical History:  Procedure Laterality Date   BIOPSY  05/21/2018   Procedure: BIOPSY;  Surgeon: Lennard Lesta FALCON, MD;  Location: [facility];  Service: Endoscopy;;   BIOPSY  05/11/2021   Procedure: BIOPSY;  Surgeon: Elicia Claw, MD;  Location: [facility];  Service: Gastroenterology;;   COLONOSCOPY WITH PROPOFOL  N/A 03/19/2013   Procedure: COLONOSCOPY  WITH PROPOFOL ;  Surgeon: Gladis MARLA Louder, MD;  Location: [facility];  Service: Endoscopy;  Laterality: N/A;   COLONOSCOPY WITH PROPOFOL  N/A 05/11/2021   Procedure: COLONOSCOPY WITH PROPOFOL ;  Surgeon: Elicia Claw, MD;  Location: [facility];  Service: Gastroenterology;  Laterality: N/A;   Cystourethroscopy, [facility] T[facility]P.  04/16/2010   ESOPHAGOGASTRODUODENOSCOPY (EGD) WITH PROPOFOL  N/A 05/21/2018   Procedure: ESOPHAGOGASTRODUODENOSCOPY (EGD) WITH PROPOFOL ;  Surgeon: Lennard Lesta FALCON, MD;  Location: [facility];  Service: Endoscopy;  Laterality: N/A;   LASER ABLATION Right 01/26/2023   Laser ablation of right greater saphenous vein with >20 stab phlebectomies   LASER ABLATION Left 06/15/2023   Laser ablation of left greater saphenous vein   NASAL SINUS SURGERY  04/04/1985   POLYPECTOMY  05/11/2021   Procedure: POLYPECTOMY;  Surgeon: Elicia Claw, MD;  Location: [facility];  Service: Gastroenterology;;   Right video-assisted thoracoscopy, right upper lobectomy and  01/23/2006   The Olympus video bronchoscope was introduced via the right  12/21/2005   TONSILLECTOMY  04/05/1955   Torn medial and lateral menisci, left knee.  11/06/2006    Current Outpatient Medications  Medication Sig Dispense Refill   aspirin 81 MG tablet Take 81 mg by mouth every morning.  (Patient taking differently: Take 81 mg by mouth every other day.)     amitriptyline (ELAVIL) 50 MG tablet  Take 50 mg by mouth at bedtime.     amLODipine  (NORVASC ) 10 MG tablet Take 10 mg by mouth daily.     Cholecalciferol 2000 units CAPS Take 2,000 Units by mouth daily.      ciclopirox  (PENLAC ) 8 % solution Apply topically at bedtime. Apply over nail and surrounding skin. Apply daily over previous coat. After seven (7) days, may remove with alcohol and continue cycle. 6.6 mL 0   levETIRAcetam (KEPPRA) 750 MG tablet Take 750 mg by mouth at bedtime.      metoprolol  succinate (TOPROL -XL) 25 MG 24 hr tablet Take 1 tablet (25 mg  total) by mouth daily. 90 tablet 3   Multiple Vitamins-Minerals (CENTRUM SILVER PO) Take 1 tablet by mouth daily.      nitroGLYCERIN  (NITROSTAT ) 0.4 MG SL tablet Place 1 tablet (0.4 mg total) under the tongue every 5 (five) minutes as needed for chest pain. 25 tablet 4   osimertinib  mesylate (TAGRISSO ) 80 MG tablet Take 1 tablet (80 mg total) by mouth daily. 30 tablet 3   polyethylene glycol (MIRALAX / GLYCOLAX) packet Take 17 g by mouth daily. Reported on 08/27/2015     psyllium (METAMUCIL) 58.6 % powder Take 1 packet by mouth daily. Reported on 08/27/2015     rosuvastatin (CRESTOR) 5 MG tablet Take 5 mg by mouth every morning.      SYNTHROID 112 MCG tablet Take 112 mcg by mouth daily.     valsartan-hydrochlorothiazide (DIOVAN-HCT) 320-25 MG per tablet Take 1 tablet by mouth every morning.      No current facility-administered medications for this visit.    Physical Exam BP 135/70 (BP Location: Left Arm)   Pulse 72   Resp 18   Ht 6' (1.829 m)   Wt 236 lb (107 kg)   SpO2 96%   BMI 32.14 kg/m  78 year old man in no acute distress Alert and oriented x 3 with no focal deficits HEENT within normal limits Neck supple with no adenopathy Cardiac regular rate and rhythm, no murmur Lungs diminished to right base but otherwise clear No peripheral edema  Diagnostic Tests: PET AND CT SKULL BASE TO MID THIGH 01/26/2024 10:09:56 AM   TECHNIQUE:   RADIOPHARMACEUTICAL: 11.77 mCi F-18 FDG Uptake time 60 minutes. Glucose level 99 mg/dl.   PET imaging was acquired from the base of the skull to the mid thighs. Non-contrast enhanced computed tomography was obtained for attenuation correction and anatomic localization.   COMPARISON: CT 01/09/2024, CT 12/20/2022   CLINICAL HISTORY: Non-small cell lung cancer (NSCLC), staging. EOV; 11.77 mCi F18-FDG in RAC by SAW/ALM @ 0856; 99 FBG ; 2007 dx with nsclc; recurrence in 2015, new nodule seen on ct 01/05/24; Currently taking chemo pills    FINDINGS:   HEAD AND NECK: No metabolically active cervical lymphadenopathy.   CHEST: Rounded mass adjacent to the surgical site in the right lower lobe measures 3.8 x 3.1 cm on image 70. This mass is increased in size over time and has now intense metabolic activity with SUV maximal 10.3 on image 70. Small nodule in the right lower lobe measures 14 mm with SUV max equal 4.4 on image 70. No hypermetabolic mediastinal lymph nodes. No supraclavicular adenopathy.   ABDOMEN AND PELVIS: Irregular cystic lesion superior to the right kidney without metabolic activity. Gallstones noted. No metabolically active intraperitoneal mass. No metabolically active lymphadenopathy. Physiologic activity within the gastrointestinal and genitourinary systems.   BONES AND SOFT TISSUE: No abnormal FDG activity localizes to the bones. No  metabolically active aggressive osseous lesion.   IMPRESSION: 1. Enlarging hypermetabolic right lower lobe mass adjacent to the surgical site consistent with recurrent lung cancer 2. Hypermetabolic 14 mm right lower lobe pulmonary nodule suspicious for a second site of recurrence 3. No hypermetabolic mediastinal or subclavicular lymphadenopathy.   Electronically signed by: Norleen Boxer MD 01/28/2024 05:27 PM EDT RP Workstation: [id number] I personally reviewed the CT and PET/CT images.  There is a large central right lung mass that is hypermetabolic.  Abuts the trachea and right mainstem bronchus.  No mediastinal adenopathy.  Hypermetabolic 14 mm right lower lobe lung nodule.  Impression: [name] is a 78 year old man with a past history of lung cancer, agent orange exposure, hypertension, hyperlipidemia, hypothyroidism, and sleep apnea.  Recently had a stage Ia adenocarcinoma resected with lobectomy in 2007.  10 years later developed recurrence which was eGFR positive.  Was treated with Tarceva  initially and now Tagrisso .  Recent CT showed progression of disease  and PET/CT shows that disease is hypermetabolic.  This is a large central mass that abuts the right mainstem and trachea.  Does not appear to be resectable.  I discussed the findings with [name].  He has an appointment with Dr. Gatha next week.  If Dr. Gatha wants to pursue a biopsy, I can schedule him for bronchoscopy and endobronchial ultrasound.  I did discuss bronchoscopy and endobronchial ultrasound with [name].  He understands the procedure would be done in the OR under general anesthesia.  We would plan to do it on an outpatient basis.  There would be no incisions as it is purely endoscopic.  I informed him of the indications, risks, benefits, and alternatives.  He understands the risks include, but are not limited to those associated with general anesthesia as well as bleeding and pneumothorax.  He will meet with Dr. Sherrod and then let us  know if he wants to pursue a biopsy.  Plan: Follow-up with Dr. Sherrod Call to schedule bronchoscopy and endobronchial ultrasound  I spent over 30 minutes in review of records, images, and consultation with [name] today. Elspeth JAYSON Millers, MD [facility] [phone]

## 2024-02-01 ENCOUNTER — Other Ambulatory Visit (HOSPITAL_COMMUNITY): Payer: Self-pay

## 2024-02-02 DIAGNOSIS — I700 Atherosclerosis of aorta: Secondary | ICD-10-CM | POA: Diagnosis not present

## 2024-02-02 DIAGNOSIS — N400 Enlarged prostate without lower urinary tract symptoms: Secondary | ICD-10-CM | POA: Diagnosis not present

## 2024-02-02 DIAGNOSIS — I10 Essential (primary) hypertension: Secondary | ICD-10-CM | POA: Diagnosis not present

## 2024-02-02 DIAGNOSIS — J439 Emphysema, unspecified: Secondary | ICD-10-CM | POA: Diagnosis not present

## 2024-02-02 DIAGNOSIS — C3490 Malignant neoplasm of unspecified part of unspecified bronchus or lung: Secondary | ICD-10-CM | POA: Diagnosis not present

## 2024-02-02 DIAGNOSIS — E785 Hyperlipidemia, unspecified: Secondary | ICD-10-CM | POA: Diagnosis not present

## 2024-02-05 ENCOUNTER — Other Ambulatory Visit: Payer: Self-pay

## 2024-02-05 ENCOUNTER — Telehealth: Payer: Self-pay | Admitting: Internal Medicine

## 2024-02-05 ENCOUNTER — Ambulatory Visit: Attending: Surgery | Admitting: Surgery

## 2024-02-05 ENCOUNTER — Encounter: Payer: Self-pay | Admitting: Surgery

## 2024-02-05 VITALS — BP 133/74 | HR 58 | Temp 97.9°F | Ht 72.0 in | Wt 237.0 lb

## 2024-02-05 DIAGNOSIS — I83812 Varicose veins of left lower extremities with pain: Secondary | ICD-10-CM | POA: Diagnosis not present

## 2024-02-05 DIAGNOSIS — I8393 Asymptomatic varicose veins of bilateral lower extremities: Secondary | ICD-10-CM

## 2024-02-05 NOTE — Progress Notes (Signed)
 [facility] [place]  Patient name: [name] MRN: [id number] DOB: [birth year] Sex: male   REASON FOR VISIT:    Follow-up  HISOTRY OF PRESENT ILLNESS:    [name] is a 78 y.o. male is status post endovenous laser ablation of the right saphenous vein and greater than 20 stabs on 01/26/2023 for class IV disease.  He also underwent laser ablation of the left greater saphenous vein on 06/15/2023 for class IV disease.  He says that for several months he has noticed that the left leg is more swollen than the right.  He is complaining of some itching in the leg as well as some tingling in both feet.  [name] has a history of lung cancer since 2007.ley he is being treated by Dr. Sherrod and Dr. Kerrin.  He recently had a hypermetabolic lesion on PET.  He is scheduled to see Dr. Sherrod on Wednesday to determine whether or not this needs to be biopsied   PAST MEDICAL HISTORY:   Past Medical History:  Diagnosis Date   Cholelithiases 07/16/2015   Complication of anesthesia 2007   quit breathing with bronchoscopy, had to spend night   History of agent Orange exposure 44-45 yrs ago   History of pneumonia  last 3-4 yrs ago   3 -4 different times   Hypercholesterolemia    Hypertension    Hypothyroidism    lung ca dx'd 2007   lung right   Sinus disease    hx of   Sleep apnea    uses cpap setting opf 12     FAMILY HISTORY:   Family History  Problem Relation Age of Onset   Hypertension Mother    Varicose Veins Mother    Heart attack Father    Heart attack Brother    Varicose Veins Sister     SOCIAL HISTORY:   Social History   Tobacco Use   Smoking status: Never   Smokeless tobacco: Never  Substance Use Topics   Alcohol use: No    Alcohol/week: 0.0 standard drinks of alcohol     ALLERGIES:   Allergies  Allergen Reactions   Lisinopril Cough   Prednisone Rash   Furosemide  Rash     CURRENT  MEDICATIONS:   Current Outpatient Medications  Medication Sig Dispense Refill   amitriptyline (ELAVIL) 50 MG tablet Take 50 mg by mouth at bedtime.     amLODipine  (NORVASC ) 10 MG tablet Take 10 mg by mouth daily.     aspirin 81 MG tablet Take 81 mg by mouth every morning.  (Patient taking differently: Take 81 mg by mouth every other day.)     Cholecalciferol 2000 units CAPS Take 2,000 Units by mouth daily.      ciclopirox  (PENLAC ) 8 % solution Apply topically at bedtime. Apply over nail and surrounding skin. Apply daily over previous coat. After seven (7) days, may remove with alcohol and continue cycle. 6.6 mL 0   levETIRAcetam (KEPPRA) 750 MG tablet Take 750 mg by mouth at bedtime.      metoprolol  succinate (TOPROL -XL) 25 MG 24 hr tablet Take 1 tablet (25 mg total) by mouth daily. 90 tablet 3   Multiple Vitamins-Minerals (CENTRUM SILVER PO) Take 1 tablet by mouth daily.      nitroGLYCERIN  (NITROSTAT ) 0.4 MG SL tablet Place 1 tablet (0.4 mg total) under the tongue every 5 (five) minutes as needed for chest pain. 25 tablet 4   osimertinib  mesylate (TAGRISSO ) 80 MG  tablet Take 1 tablet (80 mg total) by mouth daily. 30 tablet 3   polyethylene glycol (MIRALAX / GLYCOLAX) packet Take 17 g by mouth daily. Reported on 08/27/2015     psyllium (METAMUCIL) 58.6 % powder Take 1 packet by mouth daily. Reported on 08/27/2015     rosuvastatin (CRESTOR) 5 MG tablet Take 5 mg by mouth every morning.      SYNTHROID 112 MCG tablet Take 112 mcg by mouth daily.     valsartan-hydrochlorothiazide (DIOVAN-HCT) 320-25 MG per tablet Take 1 tablet by mouth every morning.      No current facility-administered medications for this visit.    REVIEW OF SYSTEMS:   [X]  denotes positive finding, [ ]  denotes negative finding Cardiac  Comments:  Chest pain or chest pressure:    Shortness of breath upon exertion:    Short of breath when lying flat:    Irregular heart rhythm:        Vascular    Pain in calf, thigh, or  hip brought on by ambulation:    Pain in feet at night that wakes you up from your sleep:     Blood clot in your veins:    Leg swelling:  x       Pulmonary    Oxygen at home:    Productive cough:     Wheezing:         Neurologic    Sudden weakness in arms or legs:     Sudden numbness in arms or legs:     Sudden onset of difficulty speaking or slurred speech:    Temporary loss of vision in one eye:     Problems with dizziness:         Gastrointestinal    Blood in stool:     Vomited blood:         Genitourinary    Burning when urinating:     Blood in urine:        Psychiatric    Major depression:         Hematologic    Bleeding problems:    Problems with blood clotting too easily:        Skin    Rashes or ulcers:        Constitutional    Fever or chills:      PHYSICAL EXAM:   Vitals:   02/05/24 1343  Weight: 237 lb (107.5 kg)  Height: 6' (1.829 m)    GENERAL: The patient is a well-nourished male, in no acute distress. The vital signs are documented above. CARDIAC: There is a regular rate and rhythm.  VASCULAR: Prominent left popliteal fossa varicosity, bilateral edema PULMONARY: Non-labored respirations MUSCULOSKELETAL: There are no major deformities or cyanosis. NEUROLOGIC: No focal weakness or paresthesias are detected. SKIN: There are no ulcers or rashes noted. PSYCHIATRIC: The patient has a normal affect.  STUDIES:   I have reviewed the following:  Left:  - No evidence of deep vein thrombosis seen in the left lower extremity,  from the common femoral through the popliteal veins.  - Color duplex evaluation of the left lower extremity shows there is  thrombus in the small saphenous vein.  - The deep venous system is competent.  - The great saphenous vein is competent.  - Unable to evaluate small saphenous vein for insufficiency secondary to  small caliber.  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    MEDICAL ISSUES:   CEAP class IV disease bilaterally.  The patient is having worsening swelling of the left leg.  Venous ultrasound shows no evidence of reflux, however he does have a very prominent varicosity which could be contributing.  My recommendation is for stab phlebectomy of this large cluster of varicosities.  He however is dealing with a possibility of lung cancer recurrence and would like to have that issue addressed before dealing with the varicose veins.  I think this seems reasonable.  He should continue to wear his compression stockings.  I will schedule him for a follow-up evaluation in 6 months.  There is a possible thrombus in the small saphenous vein.  This does not communicate with the deeper veins.  No treatment is recommended at this time    Malvina New, IV, MD, FACS [facility] [phone] Pager [phone]

## 2024-02-05 NOTE — Progress Notes (Signed)
 Specialty Pharmacy Refill Coordination Note  [name] is a 78 y.o. male contacted today regarding refills of specialty medication(s) Osimertinib  Mesylate (TAGRISSO )   Patient requested [name] at [facility] [place] date: 02/13/24   Medication will be filled on: 02/12/24

## 2024-02-06 DIAGNOSIS — I10 Essential (primary) hypertension: Secondary | ICD-10-CM | POA: Diagnosis not present

## 2024-02-06 DIAGNOSIS — I700 Atherosclerosis of aorta: Secondary | ICD-10-CM | POA: Diagnosis not present

## 2024-02-06 DIAGNOSIS — J439 Emphysema, unspecified: Secondary | ICD-10-CM | POA: Diagnosis not present

## 2024-02-07 ENCOUNTER — Inpatient Hospital Stay: Attending: Internal Medicine | Admitting: Internal Medicine

## 2024-02-07 VITALS — BP 150/79 | HR 74 | Temp 97.3°F | Resp 17 | Ht 72.0 in | Wt 237.0 lb

## 2024-02-07 DIAGNOSIS — Z902 Acquired absence of lung [part of]: Secondary | ICD-10-CM | POA: Diagnosis not present

## 2024-02-07 DIAGNOSIS — C3431 Malignant neoplasm of lower lobe, right bronchus or lung: Secondary | ICD-10-CM

## 2024-02-07 DIAGNOSIS — Z85118 Personal history of other malignant neoplasm of bronchus and lung: Secondary | ICD-10-CM | POA: Insufficient documentation

## 2024-02-07 NOTE — Progress Notes (Signed)
 [facility] Telephone:([phone]) [phone]   Fax:([phone]  OFFICE PROGRESS NOTE  [name], MD 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  PRIOR THERAPY: 1) Status post right upper lobectomy under the care of Dr. Kerrin on 01/23/2006.  2) Tarceva  150 mg by mouth daily as part of the BMS checkmate 370 clinical trial. Status post 6 weeks of treatment. 3) Tarceva  100 mg by mouth daily as part of the BMS Checkmate 370 clinical trial. First dose started 07/17/2014 status post 101 months of treatment  CURRENT THERAPY: Tagrisso  80 mg p.o. daily started 12/28/2022.   INTERVAL HISTORY: [name] 78 y.o. male returns to the clinic today for follow-up visit accompanied by his wife. Discussed the use of AI scribe software for clinical note transcription with the patient, who gave verbal consent to proceed.  History of Present Illness [name] is a 78 year old male with recurrent non-small cell lung cancer who presents for evaluation with repeat CT scan for restaging of his disease.  He has a history of recurrent non-small cell lung cancer, initially diagnosed as stage 1A in September 2007. He underwent a right upper lobectomy in October 2007. Post-surgery, he was treated with Tarceva  for over 101 months, which was later discontinued. In September 2020, his treatment was switched to Tagrisso , 80 mg per day.  He experiences a 'little bit of pain' mostly on the right side under the lower rib cage. No unusual shortness of breath, cough, nausea, vomiting, rash from Tarceva , or diarrhea.   [name] is a 78 year old male with recurrent non-small cell lung cancer who presents for evaluation after a repeat PET scan. He is accompanied by his wife, Manuelita.  He has a history of  recurrent non-small cell lung cancer, adenocarcinoma with a positive EGFR mutation in exon 21 (L858R), initially diagnosed as stage 1A in September 2007. He underwent a right upper lobectomy in October 2007 and was on treatment with Tarceva  for over 103 months before switching to Tagrisso  in September 2024. He feels 'okay' physically but notes having 'not much energy.' No side effects from Tagrisso .  He mentions vascular issues with symptoms of burning and itching in his feet. He is cautious about drug interactions, preferring to have prescriptions checked for interactions before use.    MEDICAL HISTORY: Past Medical History:  Diagnosis Date   Cholelithiases 07/16/2015   Complication of anesthesia 2007   quit breathing with bronchoscopy, had to spend night   History of agent Orange exposure 44-45 yrs ago   History of pneumonia  last 3-4 yrs ago   3 -4 different times   Hypercholesterolemia    Hypertension    Hypothyroidism    lung ca dx'd 2007   lung right   Sinus disease    hx of   Sleep apnea    uses cpap setting opf 12    ALLERGIES:  is allergic to lisinopril, prednisone, and furosemide .  MEDICATIONS:  Current Outpatient Medications  Medication Sig Dispense Refill   amitriptyline (ELAVIL) 50 MG tablet Take 50 mg by mouth at bedtime.     amLODipine  (NORVASC ) 10 MG tablet Take 10 mg by mouth daily.     aspirin 81 MG tablet Take 81 mg by mouth every morning.  (Patient taking differently:  Take 81 mg by mouth every other day.)     Cholecalciferol 2000 units CAPS Take 2,000 Units by mouth daily.      ciclopirox  (PENLAC ) 8 % solution Apply topically at bedtime. Apply over nail and surrounding skin. Apply daily over previous coat. After seven (7) days, may remove with alcohol and continue cycle. 6.6 mL 0   levETIRAcetam (KEPPRA) 750 MG tablet Take 750 mg by mouth at bedtime.      metoprolol  succinate (TOPROL -XL) 25 MG 24 hr tablet Take 1 tablet (25 mg total) by mouth daily. 90 tablet 3    Multiple Vitamins-Minerals (CENTRUM SILVER PO) Take 1 tablet by mouth daily.      nitroGLYCERIN  (NITROSTAT ) 0.4 MG SL tablet Place 1 tablet (0.4 mg total) under the tongue every 5 (five) minutes as needed for chest pain. 25 tablet 4   osimertinib  mesylate (TAGRISSO ) 80 MG tablet Take 1 tablet (80 mg total) by mouth daily. 30 tablet 3   polyethylene glycol (MIRALAX / GLYCOLAX) packet Take 17 g by mouth daily. Reported on 08/27/2015     psyllium (METAMUCIL) 58.6 % powder Take 1 packet by mouth daily. Reported on 08/27/2015     rosuvastatin (CRESTOR) 5 MG tablet Take 5 mg by mouth every morning.      SYNTHROID 112 MCG tablet Take 112 mcg by mouth daily.     valsartan-hydrochlorothiazide (DIOVAN-HCT) 320-25 MG per tablet Take 1 tablet by mouth every morning.      No current facility-administered medications for this visit.    SURGICAL HISTORY:  Past Surgical History:  Procedure Laterality Date   BIOPSY  05/21/2018   Procedure: BIOPSY;  Surgeon: Lennard Lesta FALCON, MD;  Location: [facility];  Service: Endoscopy;;   BIOPSY  05/11/2021   Procedure: BIOPSY;  Surgeon: Elicia Claw, MD;  Location: [facility];  Service: Gastroenterology;;   COLONOSCOPY WITH PROPOFOL  N/A 03/19/2013   Procedure: COLONOSCOPY WITH PROPOFOL ;  Surgeon: Gladis MARLA Louder, MD;  Location: [facility];  Service: Endoscopy;  Laterality: N/A;   COLONOSCOPY WITH PROPOFOL  N/A 05/11/2021   Procedure: COLONOSCOPY WITH PROPOFOL ;  Surgeon: Elicia Claw, MD;  Location: [facility];  Service: Gastroenterology;  Laterality: N/A;   Cystourethroscopy, [facility] TURP.  04/16/2010   ESOPHAGOGASTRODUODENOSCOPY (EGD) WITH PROPOFOL  N/A 05/21/2018   Procedure: ESOPHAGOGASTRODUODENOSCOPY (EGD) WITH PROPOFOL ;  Surgeon: Lennard Lesta FALCON, MD;  Location: [facility];  Service: Endoscopy;  Laterality: N/A;   LASER ABLATION Right 01/26/2023   Laser ablation of right greater saphenous vein with >20 stab phlebectomies   LASER ABLATION Left  06/15/2023   Laser ablation of left greater saphenous vein   NASAL SINUS SURGERY  04/04/1985   POLYPECTOMY  05/11/2021   Procedure: POLYPECTOMY;  Surgeon: Elicia Claw, MD;  Location: [facility];  Service: Gastroenterology;;   Right video-assisted thoracoscopy, right upper lobectomy and  01/23/2006   The Olympus video bronchoscope was introduced via the right  12/21/2005   TONSILLECTOMY  04/05/1955   Torn medial and lateral menisci, left knee.  11/06/2006    REVIEW OF SYSTEMS:  Constitutional: positive for fatigue Eyes: negative Ears, nose, mouth, throat, and face: negative Respiratory: negative Cardiovascular: negative Gastrointestinal: negative Genitourinary:negative Integument/breast: negative Hematologic/lymphatic: negative Musculoskeletal:negative Neurological: negative Behavioral/Psych: negative Endocrine: negative Allergic/Immunologic: negative   PHYSICAL EXAMINATION: General appearance: alert, cooperative, fatigued, and no distress Head: Normocephalic, without obvious abnormality, atraumatic Neck: no adenopathy, no JVD, supple, symmetrical, trachea midline, and thyroid  not enlarged, symmetric, no tenderness/mass/nodules Lymph nodes: Cervical, supraclavicular, and axillary nodes normal. Resp: clear to  auscultation bilaterally Back: symmetric, no curvature. ROM normal. No CVA tenderness. Cardio: regular rate and rhythm, S1, S2 normal, no murmur, click, rub or gallop GI: soft, non-tender; bowel sounds normal; no masses,  no organomegaly Extremities: extremities normal, atraumatic, no cyanosis or edema Neurologic: Alert and oriented X 3, normal strength and tone. Normal symmetric reflexes. Normal coordination and gait  ECOG PERFORMANCE STATUS: 1 - Symptomatic but completely ambulatory  Blood pressure (!) 150/79, pulse 74, temperature (!) 97.3 F (36.3 C), temperature source Temporal, resp. rate 17, height 6' (1.829 m), weight 237 lb (107.5 kg), SpO2  97%.  LABORATORY DATA: Lab Results  Component Value Date   WBC 7.2 01/08/2024   HGB 11.1 (L) 01/08/2024   HCT 32.7 (L) 01/08/2024   MCV 101.6 (H) 01/08/2024   PLT 98 (L) 01/08/2024      Chemistry      Component Value Date/Time   NA 138 01/08/2024 0844   NA 141 03/11/2019 0959   NA 139 04/06/2017 0932   K 3.8 01/08/2024 0844   K 3.8 04/06/2017 0932   CL 101 01/08/2024 0844   CL 102 04/24/2012 0853   CO2 33 (H) 01/08/2024 0844   CO2 29 04/06/2017 0932   BUN 16 01/08/2024 0844   BUN 18 03/11/2019 0959   BUN 14.4 04/06/2017 0932   CREATININE 1.15 01/08/2024 0844   CREATININE 1.0 04/06/2017 0932      Component Value Date/Time   CALCIUM 9.7 01/08/2024 0844   CALCIUM 9.5 04/06/2017 0932   ALKPHOS 66 01/08/2024 0844   ALKPHOS 58 04/06/2017 0932   AST 26 01/08/2024 0844   AST 27 04/06/2017 0932   ALT 22 01/08/2024 0844   ALT 27 04/06/2017 0932   BILITOT 0.5 01/08/2024 0844   BILITOT 0.45 04/06/2017 0932       RADIOGRAPHIC STUDIES: NM PET Image Restage (PS) Skull Base to Thigh (F-18 FDG) Result Date: 01/28/2024 EXAM: PET AND CT SKULL BASE TO MID THIGH 01/26/2024 10:09:56 AM TECHNIQUE: RADIOPHARMACEUTICAL: 11.77 mCi F-18 FDG Uptake time 60 minutes. Glucose level 99 mg/dl. PET imaging was acquired from the base of the skull to the mid thighs. Non-contrast enhanced computed tomography was obtained for attenuation correction and anatomic localization. COMPARISON: CT 01/09/2024, CT 12/20/2022 CLINICAL HISTORY: Non-small cell lung cancer (NSCLC), staging. EOV; 11.77 mCi F18-FDG in RAC by SAW/ALM @ 0856; 99 FBG ; 2007 dx with nsclc; recurrence in 2015, new nodule seen on ct 01/05/24; Currently taking chemo pills FINDINGS: HEAD AND NECK: No metabolically active cervical lymphadenopathy. CHEST: Rounded mass adjacent to the surgical site in the right lower lobe measures 3.8 x 3.1 cm on image 70. This mass is increased in size over time and has now intense metabolic activity with SUV  maximal 10.3 on image 70. Small nodule in the right lower lobe measures 14 mm with SUV max equal 4.4 on image 70. No hypermetabolic mediastinal lymph nodes. No supraclavicular adenopathy. ABDOMEN AND PELVIS: Irregular cystic lesion superior to the right kidney without metabolic activity. Gallstones noted. No metabolically active intraperitoneal mass. No metabolically active lymphadenopathy. Physiologic activity within the gastrointestinal and genitourinary systems. BONES AND SOFT TISSUE: No abnormal FDG activity localizes to the bones. No metabolically active aggressive osseous lesion. IMPRESSION: 1. Enlarging hypermetabolic right lower lobe mass adjacent to the surgical site consistent with recurrent lung cancer 2. Hypermetabolic 14 mm right lower lobe pulmonary nodule suspicious for a second site of recurrence 3. No hypermetabolic mediastinal or subclavicular lymphadenopathy. Electronically signed by: Norleen Boxer MD  01/28/2024 05:27 PM EDT RP Workstation: [id number]   CT CHEST ABDOMEN PELVIS W CONTRAST Result Date: 01/11/2024 CLINICAL DATA:  Non-small-cell lung cancer restaging * Tracking Code: BO * EXAM: CT CHEST, ABDOMEN, AND PELVIS WITH CONTRAST TECHNIQUE: Multidetector CT imaging of the chest, abdomen and pelvis was performed following the standard protocol during bolus administration of intravenous contrast. RADIATION DOSE REDUCTION: This exam was performed according to the departmental dose-optimization program which includes automated exposure control, adjustment of the mA and/or kV according to patient size and/or use of iterative reconstruction technique. CONTRAST:  OMNIPAQUE  IOHEXOL  300 MG/ML  SOLN COMPARISON:  07/18/2023 FINDINGS: CT CHEST FINDINGS Cardiovascular: Aortic atherosclerosis. Normal heart size. No pericardial effusion. Mediastinum/Nodes: No enlarged mediastinal, hilar, or axillary lymph nodes. Thyroid  gland, trachea, and esophagus demonstrate no significant findings. Lungs/Pleura:  Status post right upper lobectomy. Slight enlargement of a soft tissue nodule of the superior posterior aspect of the right hilum, on today's examination measuring 3.7 x 2.6 cm, previously 3.3 x 1.9 cm when measured similarly (series 7, image 56). Additional bandlike scarring of the anterior right lower lobe. No pleural effusion or pneumothorax. Musculoskeletal: No chest wall abnormality. No acute osseous findings. CT ABDOMEN PELVIS FINDINGS Hepatobiliary: No solid liver abnormality is seen. Gallstone. No gallbladder wall thickening, or biliary dilatation. Pancreas: Unremarkable. No pancreatic ductal dilatation or surrounding inflammatory changes. Spleen: Normal in size without significant abnormality. Adrenals/Urinary Tract: Adrenal glands are unremarkable. Kidneys are normal, without renal calculi, solid lesion, or hydronephrosis. Bladder is unremarkable. Stomach/Bowel: Stomach is within normal limits. Descending and sigmoid diverticulosis with a very redundant sigmoid colon. Long segment wall thickening and fat stranding about the diverticular mid to distal sigmoid colon (series 2, image 107). Large burden of stool throughout the colon. Vascular/Lymphatic: Aortic atherosclerosis. No enlarged abdominal or pelvic lymph nodes. Reproductive: No mass or other abnormality. Other: No abdominal wall hernia or abnormality. No ascites. Musculoskeletal: No acute osseous findings. IMPRESSION: 1. Status post right upper lobectomy. 2. Slight enlargement of a soft tissue nodule of the superior posterior aspect of the right hilum, on today's examination measuring 3.7 x 2.6 cm, previously 3.3 x 1.9 cm when measured similarly. This is highly concerning for recurrent disease and has clearly enlarged over a longer period of follow-up. PET-CT may be helpful to assess for metabolically active disease. 3. No evidence of lymphadenopathy or metastatic disease in the abdomen or pelvis. 4. Descending and sigmoid diverticulosis with a very  redundant sigmoid colon. Long segment wall thickening and fat stranding about the diverticular mid to distal sigmoid colon, consistent with acute diverticulitis. No evidence of complicating perforation or abscess. 5. Cholelithiasis. These results will be called to the ordering clinician or representative by the Radiologist Assistant, and communication documented in the PACS or [employer]. Aortic Atherosclerosis (ICD10-I70.0). Electronically Signed   By: Marolyn JONETTA Jaksch M.D.   On: 01/11/2024 17:28      ASSESSMENT AND PLAN:  This is a very pleasant 78 years old white male with recurrent non-small cell lung cancer, adenocarcinoma and positive EGFR mutation in exon 21. He is currently on Tarceva  100 mg by mouth daily status post around 101 months of treatment. The patient had evidence for disease progression and he is switching to treatment with Tagrisso  80 mg p.o. daily on December 28, 2022.  He has been tolerating this treatment fairly well. He had repeat CT scan of the chest, abdomen and pelvis performed recently.  I personally independently reviewed the scan images and discussed the result with the  patient today and showed him the images.  Her scan showed slight enlargement of the soft tissue nodule of the superior posterior aspect of the right hilum and currently measuring 3.7 x 2.6 cm compared to 3.3 x 1.9 cm. He had a PET scan performed recently that showed the enlarging hypermetabolic right lower lobe mass adjacent to the surgical site consistent with recurrent lung cancer.  There was also hypermetabolic 1.4 cm right lower lobe pulmonary nodule suspicious for second site of recurrence.  There is no hypermetabolic mediastinal or supraclavicular lymphadenopathy and no distant metastasis. I discussed the results with the patient and his wife. Assessment and Plan Assessment & Plan Recurrent metastatic non-small cell lung adenocarcinoma with EGFR L858R mutation Recurrent non-small cell lung  adenocarcinoma with EGFR L858R mutation, initially diagnosed as stage 1A in September 2007. Currently on Tagrisso  since September 2024. Recent imaging shows enlarging right lower lobe lung mass and another site of metastasis. Differential includes transformation to a different type of cancer or development of resistance to Tagrisso . - Scheduled bronchoscopy for biopsy of right lower lobe nodule. - Sent biopsy for molecular testing to assess for resistant mutations. - Referred to radiation oncology for potential radiation treatment post-biopsy. - Continue Tagrisso  if no resistant mutations are found with consideration of adding systemic chemotherapy in the future if needed.  Fatigue Reports of fatigue, likely related to cancer and ongoing treatment.  He was advised to call immediately if he has any other concerning symptoms in the interval. The patient voices understanding of current disease status and treatment options and is in agreement with the current care plan. All questions were answered. The patient knows to call the clinic with any problems, questions or concerns. We can certainly see the patient much sooner if necessary. The total time spent in the appointment was 30 minutes.  Disclaimer: This note was dictated with voice recognition software. Similar sounding words can inadvertently be transcribed and may not be corrected upon review.

## 2024-02-08 ENCOUNTER — Inpatient Hospital Stay: Admitting: Internal Medicine

## 2024-02-08 ENCOUNTER — Other Ambulatory Visit: Payer: Self-pay | Admitting: Podiatry

## 2024-02-08 ENCOUNTER — Other Ambulatory Visit: Payer: Self-pay | Admitting: *Deleted

## 2024-02-08 ENCOUNTER — Encounter: Payer: Self-pay | Admitting: *Deleted

## 2024-02-08 DIAGNOSIS — Z5181 Encounter for therapeutic drug level monitoring: Secondary | ICD-10-CM

## 2024-02-08 DIAGNOSIS — R918 Other nonspecific abnormal finding of lung field: Secondary | ICD-10-CM

## 2024-02-09 ENCOUNTER — Other Ambulatory Visit: Payer: Self-pay

## 2024-02-09 ENCOUNTER — Telehealth: Payer: Self-pay

## 2024-02-09 NOTE — Telephone Encounter (Signed)
 Received telephone call from the patient advising the name of two medications. Inquiring if the medications are okay to take for a leg issue previously discussed with Dr. Sherrod. Medications are as followed: Gabapentin and Neurontin. Let patient know that Dr. Sherrod is not in the office today, that I would forward his message to [facility] Heilingoetter PA-C and follow-up w/ him accordingly.

## 2024-02-09 NOTE — Telephone Encounter (Signed)
 Contacted patient via telephone call. Let patient know that per Cassie Heilingoetter, PA-C; I don't think this is an issue to take from an oncology standpoint or issue with his treatment with Tagrisso . Patient voiced understanding and did not have any further questions or concerns.  (See previous telephone note for further details regarding this callback)

## 2024-02-12 ENCOUNTER — Other Ambulatory Visit: Payer: Self-pay

## 2024-02-12 ENCOUNTER — Encounter (HOSPITAL_COMMUNITY): Payer: Self-pay

## 2024-02-12 ENCOUNTER — Encounter (HOSPITAL_COMMUNITY)
Admission: RE | Admit: 2024-02-12 | Discharge: 2024-02-12 | Disposition: A | Discharge status: Home / Self Care | Source: Ambulatory Visit | Attending: Thoracic Surgery (Cardiothoracic Vascular Surgery) | Admitting: Thoracic Surgery (Cardiothoracic Vascular Surgery)

## 2024-02-12 ENCOUNTER — Ambulatory Visit (HOSPITAL_COMMUNITY)
Admission: RE | Admit: 2024-02-12 | Discharge: 2024-02-12 | Disposition: A | Discharge status: Home / Self Care | Source: Ambulatory Visit | Attending: Thoracic Surgery (Cardiothoracic Vascular Surgery) | Admitting: Thoracic Surgery (Cardiothoracic Vascular Surgery)

## 2024-02-12 ENCOUNTER — Telehealth: Payer: Self-pay

## 2024-02-12 VITALS — BP 142/71 | HR 66 | Temp 98.0°F | Resp 18 | Ht 72.0 in | Wt 243.7 lb

## 2024-02-12 DIAGNOSIS — Z01818 Encounter for other preprocedural examination: Secondary | ICD-10-CM | POA: Diagnosis not present

## 2024-02-12 DIAGNOSIS — Z5181 Encounter for therapeutic drug level monitoring: Secondary | ICD-10-CM | POA: Insufficient documentation

## 2024-02-12 DIAGNOSIS — J984 Other disorders of lung: Secondary | ICD-10-CM | POA: Diagnosis not present

## 2024-02-12 DIAGNOSIS — R918 Other nonspecific abnormal finding of lung field: Secondary | ICD-10-CM | POA: Insufficient documentation

## 2024-02-12 HISTORY — DX: Headache, unspecified: R51.9

## 2024-02-12 HISTORY — DX: Calculus of gallbladder without cholecystitis without obstruction: K80.20

## 2024-02-12 LAB — APTT: aPTT: 31 s (ref 24–36)

## 2024-02-12 LAB — COMPREHENSIVE METABOLIC PANEL WITH GFR
ALT: 36 U/L (ref 0–44)
AST: 46 U/L — ABNORMAL HIGH (ref 15–41)
Albumin: 4.1 g/dL (ref 3.5–5.0)
Alkaline Phosphatase: 74 U/L (ref 38–126)
Anion gap: 8 (ref 5–15)
BUN: 13 mg/dL (ref 8–23)
CO2: 29 mmol/L (ref 22–32)
Calcium: 9.6 mg/dL (ref 8.9–10.3)
Chloride: 97 mmol/L — ABNORMAL LOW (ref 98–111)
Creatinine, Ser: 0.88 mg/dL (ref 0.61–1.24)
GFR, Estimated: >60 mL/min (ref >60)
Glucose, Bld: 97 mg/dL (ref 70–99)
Potassium: 3.8 mmol/L (ref 3.5–5.1)
Sodium: 134 mmol/L — ABNORMAL LOW (ref 135–145)
Total Bilirubin: 0.7 mg/dL (ref 0.0–1.2)
Total Protein: 7.2 g/dL (ref 6.5–8.1)

## 2024-02-12 LAB — CBC
HCT: 36.9 % — ABNORMAL LOW (ref 39.0–52.0)
Hemoglobin: 12.8 g/dL — ABNORMAL LOW (ref 13.0–17.0)
MCH: 35.4 pg — ABNORMAL HIGH (ref 26.0–34.0)
MCHC: 34.7 g/dL (ref 30.0–36.0)
MCV: 101.9 fL — ABNORMAL HIGH (ref 80.0–100.0)
Platelets: 121 K/uL — ABNORMAL LOW (ref 150–400)
RBC: 3.62 MIL/uL — ABNORMAL LOW (ref 4.22–5.81)
RDW: 12.4 % (ref 11.5–15.5)
WBC: 4.4 K/uL (ref 4.0–10.5)
nRBC: 0 % (ref 0.0–0.2)

## 2024-02-12 LAB — SURGICAL PCR SCREEN
MRSA, PCR: NEGATIVE
Staphylococcus aureus: NEGATIVE

## 2024-02-12 LAB — PROTIME-INR
INR: 1 (ref 0.8–1.2)
Prothrombin Time: 14.1 s (ref 11.4–15.2)

## 2024-02-12 MED ORDER — GABAPENTIN 300 MG PO CAPS: 300.0000 mg | ORAL_CAPSULE | Freq: Every day | ORAL | 3 refills | Status: DC

## 2024-02-12 NOTE — Telephone Encounter (Signed)
 Writer contacted the patient to let him know Dr. Serene ordered Gabapentin for him with a 30 day supply and 3 refills.   Also, Dr. Serene requested that patient follow up with his PCP for further refills.  The patient verbalized understanding of the need to contact his PCP for future refills.

## 2024-02-12 NOTE — Progress Notes (Signed)
 PCP - Dr. Ardell Manly Cardiologist - Dr. Jeffrie - LOV - 10/02/23  PPM/ICD - denies Device Orders - n/a Rep Notified -  n/a  Chest x-ray - 02/12/24 EKG - 10/02/23 Stress Test - 01/10/18 ECHO - 01/24/22 Cardiac Cath - denies  Sleep Study - OSA+ and wears CPAP  No DM  Last dose of GLP1 agonist-  n/a GLP1 instructions:  n/a  Blood Thinner Instructions: denies Aspirin Instructions: instructed not to take day of surgery  Patient states that he takes Keppra at bedtime for headaches.  Denies history of seizures.   ERAS Protcol - NPO PRE-SURGERY Ensure or G2- n/a  COVID TEST-  n/a   Anesthesia review: yes - HTN, OSA+,   Patient denies shortness of breath, fever, cough and chest pain at PAT appointment   All instructions explained to the patient, with a verbal understanding of the material. Patient agrees to go over the instructions while at home for a better understanding. Patient also instructed to self quarantine after being tested for COVID-19. The opportunity to ask questions was provided.

## 2024-02-12 NOTE — Progress Notes (Signed)
 Surgical Instructions   Your procedure is scheduled on Wednesday, November 12th, 2025. Report to [facility] Main Entrance A at 6:30 A.M., then check in with the Admitting office. Any questions or running late day of surgery: call [phone]  Questions prior to your surgery date: call [phone], Monday-Friday, 8am-4pm. If you experience any cold or flu symptoms such as cough, fever, chills, shortness of breath, etc. between now and your scheduled surgery, please notify us  at the above number.     Remember:  Do not eat or drink after midnight the night before your surgery     Take these medicines the morning of surgery with A SIP OF WATER: Amlodipine  (Norvasc ) Levetiracetam (Keppra) Levothyroxine (Synthroid) Metoprolol  Succinate (Toprol -XL) Osimertinib  Mesylate (Tagrisso ) Rosuvastatin (Crestor)    May take these medicines IF NEEDED: Nitroglycerin  (Nitrostat ) - if you need to take this medication, please call [phone] after taking   Follow your surgeon's instructions on when to stop your Aspirin.  If no instructions received, please reach out to your surgeon's office.    One week prior to surgery, STOP taking any Aleve , Naproxen , Ibuprofen, Motrin, Advil, Goody's, BC's, all herbal medications, fish oil, and non-prescription vitamins.                     Do NOT Smoke (Tobacco/Vaping) for 24 hours prior to your procedure.  If you use a CPAP at night, you may bring your mask/headgear for your overnight stay.   You will be asked to remove any contacts, glasses, piercing's, hearing aid's, dentures/partials prior to surgery. Please bring cases for these items if needed.    Patients discharged the day of surgery will not be allowed to drive home, and someone needs to stay with them for 24 hours.  SURGICAL WAITING ROOM VISITATION Patients may have no more than 2 support people in the waiting area - these visitors may rotate.   Pre-op nurse will coordinate an appropriate  time for 1 ADULT support person, who may not rotate, to accompany patient in pre-op.  Children under the age of 63 must have an adult with them who is not the patient and must remain in the main waiting area with an adult.  If the patient needs to stay at the hospital during part of their recovery, the visitor guidelines for inpatient rooms apply.  Please refer to the [facility] website for the visitor guidelines for any additional information.   If you received a COVID test during your pre-op visit  it is requested that you wear a mask when out in public, stay away from anyone that may not be feeling well and notify your surgeon if you develop symptoms. If you have been in contact with anyone that has tested positive in the last 10 days please notify you surgeon.      Pre-operative CHG Bathing Instructions   You can play a key role in reducing the risk of infection after surgery. Your skin needs to be as free of germs as possible. You can reduce the number of germs on your skin by washing with CHG (chlorhexidine gluconate) soap before surgery. CHG is an antiseptic soap that kills germs and continues to kill germs even after washing.   DO NOT use if you have an allergy to chlorhexidine/CHG or antibacterial soaps. If your skin becomes reddened or irritated, stop using the CHG and notify one of our RNs at [phone].              TAKE  A SHOWER THE NIGHT BEFORE SURGERY   Please keep in mind the following:  DO NOT shave, including legs and underarms, 48 hours prior to surgery.   You may shave your face before/day of surgery.  Place clean sheets on your bed the night before surgery Use a clean washcloth (not used since being washed) for shower. DO NOT sleep with pet's night before surgery.  CHG Shower Instructions:  Wash your face and private area with normal soap. If you choose to wash your hair, wash first with your normal shampoo.  After you use shampoo/soap, rinse your hair and body  thoroughly to remove shampoo/soap residue.  Turn the water OFF and apply half the bottle of CHG soap to a CLEAN washcloth.  Apply CHG soap ONLY FROM YOUR NECK DOWN TO YOUR TOES (washing for 3-5 minutes)  DO NOT use CHG soap on face, private areas, open wounds, or sores.  Pay special attention to the area where your surgery is being performed.  If you are having back surgery, having someone wash your back for you may be helpful. Wait 2 minutes after CHG soap is applied, then you may rinse off the CHG soap.  Pat dry with a clean towel  Put on clean pajamas    Additional instructions for the day of surgery: If you choose, you may shower the morning of surgery with an antibacterial soap.  DO NOT APPLY any lotions, deodorants, cologne, or perfumes.   Do not wear jewelry or makeup Do not wear nail polish, gel polish, artificial nails, or any other type of covering on natural nails (fingers and toes) Do not bring valuables to the hospital. [facility] is not responsible for valuables/personal belongings. Put on clean/comfortable clothes.  Please brush your teeth.  Ask your nurse before applying any prescription medications to the skin.

## 2024-02-13 NOTE — Progress Notes (Signed)
 Anesthesia Chart Review:  78 year old male follows with oncologist Dr. Gatha for history of non-small cell lung cancer adenocarcinoma, initially treated with a right upper lobectomy in 2007. Recent imaging with evidence of recurrent disease.  Referred for bronchoscopy and biopsy of right lower lobe nodule.   Other pertinent history includes OSA on CPAP, hypertension, hypothyroidism, chronic headaches (on Keppra), and a first-degree AV block. A coronary CTA in 2021 showed a calcium score of zero and a mildly dilated ascending aorta at 40 mm, with normal coronary arteries and no coronary artery disease (CAD). A stress test in 2019 was low risk, and an echocardiogram in 2023 showed an ejection fraction of 60-65%.  Preop labs reviewed, mild hyponatremia sodium 134, mild anemia hemoglobin 12.8, mild thrombocytopenia platelets 121.  EKG 10/02/2023: Sinus bradycardia with 1st degree A-V block.  Rate 56.  CT chest abdomen pelvis 01/09/2024: IMPRESSION: 1. Status post right upper lobectomy. 2. Slight enlargement of a soft tissue nodule of the superior posterior aspect of the right hilum, on today's examination measuring 3.7 x 2.6 cm, previously 3.3 x 1.9 cm when measured similarly. This is highly concerning for recurrent disease and has clearly enlarged over a longer period of follow-up. PET-CT may be helpful to assess for metabolically active disease. 3. No evidence of lymphadenopathy or metastatic disease in the abdomen or pelvis. 4. Descending and sigmoid diverticulosis with a very redundant sigmoid colon. Long segment wall thickening and fat stranding about the diverticular mid to distal sigmoid colon, consistent with acute diverticulitis. No evidence of complicating perforation or abscess. 5. Cholelithiasis.   TTE 01/24/2022: 1. Left ventricular ejection fraction, by estimation, is 60 to 65%. The  left ventricle has normal function. The left ventricle has no regional  wall motion  abnormalities. There is mild left ventricular hypertrophy.  Left ventricular diastolic parameters  are indeterminate.   2. Right ventricular systolic function is normal. The right ventricular  size is normal.   3. Left atrial size was mildly dilated.   4. The mitral valve is normal in structure. No evidence of mitral valve  regurgitation. No evidence of mitral stenosis.   5. The aortic valve was not well visualized. Aortic valve regurgitation  is not visualized. No aortic stenosis is present.   Coronary CTA 05/14/2019: IMPRESSION: 1. Coronary calcium score of 0. This was 0 percentile for age and sex matched control.   2. Normal coronary origin with right dominance.   3. No evidence of CAD; CADRADS-0.   4. 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    Lynwood Geofm RIGGERS [facility] Short Stay Center/Anesthesiology Phone [phone] 02/13/2024 10:10 AM

## 2024-02-13 NOTE — Anesthesia Preprocedure Evaluation (Addendum)
 Anesthesia Evaluation  Patient identified by MRN, date of birth, ID band Patient awake    Reviewed: Allergy & Precautions, NPO status , Patient's Chart, lab work & pertinent test results, reviewed documented beta blocker date and time   History of Anesthesia Complications Negative for: history of anesthetic complications  Airway Mallampati: II  TM Distance: >3 FB Neck ROM: Full    Dental  (+) Dental Advisory Given   Pulmonary sleep apnea and Continuous Positive Airway Pressure Ventilation  H/o R lung Ca: lobectomy   breath sounds clear to auscultation       Cardiovascular hypertension, Pt. on medications and Pt. on home beta blockers (-) angina  Rhythm:Regular Rate:Normal  '23 ECHO: EF 60-65%, normal LVF, mild LVH, normal LVF, normal RVF, no significant valvular abnormalities  '21 Coronary CT: no CAD   Neuro/Psych  Headaches    GI/Hepatic Neg liver ROS,GERD  Controlled and Medicated,,  Endo/Other  Hypothyroidism    Renal/GU negative Renal ROS     Musculoskeletal   Abdominal   Peds  Hematology Hb 12.8, plt 121k   Anesthesia Other Findings   Reproductive/Obstetrics                              Anesthesia Physical Anesthesia Plan  ASA: 3  Anesthesia Plan: General   Post-op Pain Management: Tylenol  PO (pre-op)*   Induction: Intravenous  PONV Risk Score and Plan: Dexamethasone and Ondansetron   Airway Management Planned: Oral ETT  Additional Equipment: None  Intra-op Plan:   Post-operative Plan: Extubation in OR  Informed Consent: I have reviewed the patients History and Physical, chart, labs and discussed the procedure including the risks, benefits and alternatives for the proposed anesthesia with the patient or authorized representative who has indicated his/her understanding and acceptance.     Dental advisory given  Plan Discussed with: CRNA and Surgeon  Anesthesia Plan  Comments: (PAT note by Lynwood Hope, PA-C:  78 year old male follows with oncologist Dr. Gatha for history of non-small cell lung cancer adenocarcinoma, initially treated with a right upper lobectomy in 2007. Recent imaging with evidence of recurrent disease.  Referred for bronchoscopy and biopsy of right lower lobe nodule.  Other pertinent history includes OSA on CPAP, hypertension, hypothyroidism, chronic headaches (on Keppra), and a first-degree AV block. A coronary CTA in 2021 showed a calcium score of zero and a mildly dilated ascending aorta at 40 mm, with normal coronary arteries and no coronary artery disease (CAD). A stress test in 2019 was low risk, and an echocardiogram in 2023 showed an ejection fraction of 60-65%.  Preop labs reviewed, mild hyponatremia sodium 134, mild anemia hemoglobin 12.8, mild thrombocytopenia platelets 121.  EKG 10/02/2023: Sinus bradycardia with 1st degree A-V block.  Rate 56.  CT chest abdomen pelvis 01/09/2024: IMPRESSION: 1. Status post right upper lobectomy. 2. Slight enlargement of a soft tissue nodule of the superior posterior aspect of the right hilum, on today's examination measuring 3.7 x 2.6 cm, previously 3.3 x 1.9 cm when measured similarly. This is highly concerning for recurrent disease and has clearly enlarged over a longer period of follow-up. PET-CT may be helpful to assess for metabolically active disease. 3. No evidence of lymphadenopathy or metastatic disease in the abdomen or pelvis. 4. Descending and sigmoid diverticulosis with a very redundant sigmoid colon. Long segment wall thickening and fat stranding about the diverticular mid to distal sigmoid colon, consistent with acute diverticulitis. No evidence of complicating perforation  or abscess. 5. Cholelithiasis.  TTE 01/24/2022: 1. Left ventricular ejection fraction, by estimation, is 60 to 65%. The  left ventricle has normal function. The left ventricle has no regional  wall  motion abnormalities. There is mild left ventricular hypertrophy.  Left ventricular diastolic parameters  are indeterminate.  2. Right ventricular systolic function is normal. The right ventricular  size is normal.  3. Left atrial size was mildly dilated.  4. The mitral valve is normal in structure. No evidence of mitral valve  regurgitation. No evidence of mitral stenosis.  5. The aortic valve was not well visualized. Aortic valve regurgitation  is not visualized. No aortic stenosis is present.   Coronary CTA 05/14/2019: IMPRESSION: 1. Coronary calcium score of 0. This was 0 percentile for age and sex matched control.  2. Normal coronary origin with right dominance.  3. No evidence of CAD; CADRADS-0.  4. 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  )         Anesthesia Quick Evaluation

## 2024-02-14 ENCOUNTER — Other Ambulatory Visit: Payer: Self-pay

## 2024-02-14 ENCOUNTER — Ambulatory Visit (HOSPITAL_BASED_OUTPATIENT_CLINIC_OR_DEPARTMENT_OTHER): Payer: Self-pay | Admitting: Certified Registered Nurse Anesthetist

## 2024-02-14 ENCOUNTER — Ambulatory Visit (HOSPITAL_COMMUNITY)
Admission: RE | Admit: 2024-02-14 | Discharge: 2024-02-14 | Disposition: A | Discharge status: Home / Self Care | Attending: Thoracic Surgery (Cardiothoracic Vascular Surgery) | Admitting: Thoracic Surgery (Cardiothoracic Vascular Surgery)

## 2024-02-14 ENCOUNTER — Encounter (HOSPITAL_COMMUNITY): Payer: Self-pay | Admitting: Thoracic Surgery (Cardiothoracic Vascular Surgery)

## 2024-02-14 ENCOUNTER — Other Ambulatory Visit (HOSPITAL_COMMUNITY): Payer: Self-pay

## 2024-02-14 ENCOUNTER — Encounter (HOSPITAL_COMMUNITY)
Admission: RE | Disposition: A | Discharge status: Home / Self Care | Payer: Self-pay | Source: Home / Self Care | Attending: Thoracic Surgery (Cardiothoracic Vascular Surgery)

## 2024-02-14 ENCOUNTER — Ambulatory Visit (HOSPITAL_COMMUNITY): Payer: Self-pay | Admitting: Physician Assistant

## 2024-02-14 DIAGNOSIS — Z79890 Hormone replacement therapy (postmenopausal): Secondary | ICD-10-CM | POA: Insufficient documentation

## 2024-02-14 DIAGNOSIS — R918 Other nonspecific abnormal finding of lung field: Secondary | ICD-10-CM | POA: Insufficient documentation

## 2024-02-14 DIAGNOSIS — G4730 Sleep apnea, unspecified: Secondary | ICD-10-CM | POA: Diagnosis not present

## 2024-02-14 DIAGNOSIS — K219 Gastro-esophageal reflux disease without esophagitis: Secondary | ICD-10-CM | POA: Insufficient documentation

## 2024-02-14 DIAGNOSIS — Z79899 Other long term (current) drug therapy: Secondary | ICD-10-CM | POA: Insufficient documentation

## 2024-02-14 DIAGNOSIS — I10 Essential (primary) hypertension: Secondary | ICD-10-CM

## 2024-02-14 DIAGNOSIS — C3401 Malignant neoplasm of right main bronchus: Secondary | ICD-10-CM | POA: Diagnosis not present

## 2024-02-14 DIAGNOSIS — E7800 Pure hypercholesterolemia, unspecified: Secondary | ICD-10-CM | POA: Insufficient documentation

## 2024-02-14 DIAGNOSIS — E039 Hypothyroidism, unspecified: Secondary | ICD-10-CM

## 2024-02-14 DIAGNOSIS — Z7739 Contact with and (suspected) exposure to other war theater: Secondary | ICD-10-CM | POA: Diagnosis not present

## 2024-02-14 DIAGNOSIS — Z902 Acquired absence of lung [part of]: Secondary | ICD-10-CM | POA: Diagnosis not present

## 2024-02-14 SURGERY — BRONCHOSCOPY, WITH EBUS
Anesthesia: General

## 2024-02-14 MED ORDER — 0.9 % SODIUM CHLORIDE (POUR BTL) OPTIME
TOPICAL | Status: DC | PRN
Start: 2024-02-14 — End: 2024-02-14
  Administered 2024-02-14: 1000 mL

## 2024-02-14 MED ORDER — OXYCODONE HCL 5 MG/5ML PO SOLN: 5.0000 mg | Freq: Once | ORAL | Status: DC | PRN

## 2024-02-14 MED ORDER — LACTATED RINGERS IV SOLN: INTRAVENOUS | Status: DC

## 2024-02-14 MED ORDER — MIDAZOLAM HCL (PF) 2 MG/2ML IJ SOLN: 0.5000 mg | Freq: Once | INTRAMUSCULAR | Status: DC | PRN

## 2024-02-14 MED ORDER — PROPOFOL 10 MG/ML IV BOLUS
INTRAVENOUS | Status: DC | PRN
  Administered 2024-02-14: 130 mg via INTRAVENOUS

## 2024-02-14 MED ORDER — DEXAMETHASONE SOD PHOSPHATE PF 10 MG/ML IJ SOLN
INTRAMUSCULAR | Status: DC | PRN
  Administered 2024-02-14: 8 mg via INTRAVENOUS

## 2024-02-14 MED ORDER — PROPOFOL 10 MG/ML IV BOLUS
INTRAVENOUS | Status: AC
Start: 2024-02-14 — End: 2024-02-14
  Filled 2024-02-14: qty 20

## 2024-02-14 MED ORDER — PROPOFOL 500 MG/50ML IV EMUL
INTRAVENOUS | Status: DC | PRN
  Administered 2024-02-14: 135 ug/kg/min via INTRAVENOUS

## 2024-02-14 MED ORDER — CHLORHEXIDINE GLUCONATE 0.12 % MT SOLN
15.0000 mL | Freq: Once | OROMUCOSAL | Status: AC
  Administered 2024-02-14: 15 mL via OROMUCOSAL
  Filled 2024-02-14: qty 15

## 2024-02-14 MED ORDER — IPRATROPIUM-ALBUTEROL 0.5-2.5 (3) MG/3ML IN SOLN
RESPIRATORY_TRACT | Status: AC
  Filled 2024-02-14: qty 3

## 2024-02-14 MED ORDER — FENTANYL CITRATE (PF) 100 MCG/2ML IJ SOLN
INTRAMUSCULAR | Status: AC
  Filled 2024-02-14: qty 2

## 2024-02-14 MED ORDER — IPRATROPIUM-ALBUTEROL 0.5-2.5 (3) MG/3ML IN SOLN
3.0000 mL | Freq: Once | RESPIRATORY_TRACT | Status: AC
  Administered 2024-02-14: 3 mL via RESPIRATORY_TRACT

## 2024-02-14 MED ORDER — EPINEPHRINE PF 1 MG/ML IJ SOLN
INTRAMUSCULAR | Status: AC
Start: 2024-02-14 — End: 2024-02-14
  Filled 2024-02-14: qty 1

## 2024-02-14 MED ORDER — ORAL CARE MOUTH RINSE: 15.0000 mL | Freq: Once | OROMUCOSAL | Status: AC

## 2024-02-14 MED ORDER — EPHEDRINE 5 MG/ML INJ
INTRAVENOUS | Status: AC
  Filled 2024-02-14: qty 5

## 2024-02-14 MED ORDER — ACETAMINOPHEN 500 MG PO TABS
1000.0000 mg | ORAL_TABLET | Freq: Once | ORAL | Status: AC
  Administered 2024-02-14: 1000 mg via ORAL
  Filled 2024-02-14: qty 2

## 2024-02-14 MED ORDER — FENTANYL CITRATE (PF) 250 MCG/5ML IJ SOLN
INTRAMUSCULAR | Status: DC | PRN
Start: 2024-02-14 — End: 2024-02-14
  Administered 2024-02-14: 100 ug via INTRAVENOUS

## 2024-02-14 MED ORDER — LIDOCAINE 2% (20 MG/ML) 5 ML SYRINGE
INTRAMUSCULAR | Status: AC
Start: 2024-02-14 — End: 2024-02-14
  Filled 2024-02-14: qty 5

## 2024-02-14 MED ORDER — ROCURONIUM BROMIDE 10 MG/ML (PF) SYRINGE
PREFILLED_SYRINGE | INTRAVENOUS | Status: DC | PRN
  Administered 2024-02-14: 60 mg via INTRAVENOUS
  Administered 2024-02-14: 10 mg via INTRAVENOUS

## 2024-02-14 MED ORDER — ROCURONIUM BROMIDE 10 MG/ML (PF) SYRINGE
PREFILLED_SYRINGE | INTRAVENOUS | Status: AC
  Filled 2024-02-14: qty 10

## 2024-02-14 MED ORDER — OXYCODONE HCL 5 MG PO TABS: 5.0000 mg | ORAL_TABLET | Freq: Once | ORAL | Status: DC | PRN

## 2024-02-14 MED ORDER — ONDANSETRON HCL 4 MG/2ML IJ SOLN
INTRAMUSCULAR | Status: AC
  Filled 2024-02-14: qty 2

## 2024-02-14 MED ORDER — EPHEDRINE SULFATE-NACL 50-0.9 MG/10ML-% IV SOSY
PREFILLED_SYRINGE | INTRAVENOUS | Status: DC | PRN
Start: 2024-02-14 — End: 2024-02-14
  Administered 2024-02-14: 5 mg via INTRAVENOUS

## 2024-02-14 MED ORDER — MEPERIDINE HCL 25 MG/ML IJ SOLN: 6.2500 mg | INTRAMUSCULAR | Status: DC | PRN

## 2024-02-14 MED ORDER — LIDOCAINE 2% (20 MG/ML) 5 ML SYRINGE
INTRAMUSCULAR | Status: DC | PRN
  Administered 2024-02-14: 20 mg via INTRAVENOUS

## 2024-02-14 MED ORDER — ONDANSETRON HCL 4 MG/2ML IJ SOLN
INTRAMUSCULAR | Status: DC | PRN
  Administered 2024-02-14: 4 mg via INTRAVENOUS

## 2024-02-14 MED ORDER — EPINEPHRINE PF 1 MG/ML IJ SOLN
INTRAMUSCULAR | Status: DC | PRN
Start: 2024-02-14 — End: 2024-02-14
  Administered 2024-02-14: 1 mL via ENDOTRACHEOPULMONARY

## 2024-02-14 MED ORDER — FENTANYL CITRATE (PF) 100 MCG/2ML IJ SOLN: 25.0000 ug | INTRAMUSCULAR | Status: DC | PRN

## 2024-02-14 MED ORDER — PHENYLEPHRINE HCL-NACL 20-0.9 MG/250ML-% IV SOLN
INTRAVENOUS | Status: DC | PRN
  Administered 2024-02-14: 50 ug/min via INTRAVENOUS

## 2024-02-14 MED ORDER — SUGAMMADEX SODIUM 200 MG/2ML IV SOLN
INTRAVENOUS | Status: DC | PRN
  Administered 2024-02-14: 200 mg via INTRAVENOUS

## 2024-02-14 SURGICAL SUPPLY — 36 items
ADAPTER VALVE BIOPSY EBUS (MISCELLANEOUS) IMPLANT
BRUSH CYTOL CELLEBRITY 1.5X140 (MISCELLANEOUS) IMPLANT
CANISTER SUCTION 3000ML PPV (SUCTIONS) ×2 IMPLANT
CNTNR URN SCR LID CUP LEK RST (MISCELLANEOUS) ×2 IMPLANT
COVER BACK TABLE 60X90IN (DRAPES) ×2 IMPLANT
FILTER STRAW FLUID ASPIR (MISCELLANEOUS) IMPLANT
FORCEPS BIOP RJ4 1.8 (CUTTING FORCEPS) IMPLANT
FORCEPS RADIAL JAW LRG 4 PULM (INSTRUMENTS) IMPLANT
GAUZE 4X4 16PLY ~~LOC~~+RFID DBL (SPONGE) ×2 IMPLANT
GAUZE SPONGE 4X4 12PLY STRL (GAUZE/BANDAGES/DRESSINGS) IMPLANT
GLOVE SS BIOGEL STRL SZ 7.5 (GLOVE) ×4 IMPLANT
GOWN STRL REUS W/ TWL XL LVL3 (GOWN DISPOSABLE) ×2 IMPLANT
KIT CLEAN ENDO COMPLIANCE (KITS) ×4 IMPLANT
KIT TURNOVER KIT B (KITS) ×2 IMPLANT
MARKER SKIN DUAL TIP RULER LAB (MISCELLANEOUS) ×2 IMPLANT
NDL ASPIRATION VIZISHOT 19G (NEEDLE) IMPLANT
NDL ASPIRATION VIZISHOT 21G (NEEDLE) ×2 IMPLANT
NDL BLUNT 18X1 FOR OR ONLY (NEEDLE) IMPLANT
NEEDLE ASPIRATION VIZISHOT 19G (NEEDLE) IMPLANT
NEEDLE ASPIRATION VIZISHOT 21G (NEEDLE) ×1 IMPLANT
NEEDLE BLUNT 18X1 FOR OR ONLY (NEEDLE) IMPLANT
OIL SILICONE PENTAX (PARTS (SERVICE/REPAIRS)) ×2 IMPLANT
PAD ARMBOARD POSITIONER FOAM (MISCELLANEOUS) ×4 IMPLANT
SOLN 0.9% NACL POUR BTL 1000ML (IV SOLUTION) ×2 IMPLANT
SOLN STERILE WATER BTL 1000 ML (IV SOLUTION) ×2 IMPLANT
SYR 20ML ECCENTRIC (SYRINGE) ×4 IMPLANT
SYR 20ML LL LF (SYRINGE) ×2 IMPLANT
SYR 3ML LL SCALE MARK (SYRINGE) IMPLANT
SYR 5ML LL (SYRINGE) ×2 IMPLANT
SYR 5ML LUER SLIP (SYRINGE) ×2 IMPLANT
TOWEL GREEN STERILE (TOWEL DISPOSABLE) ×2 IMPLANT
TOWEL GREEN STERILE FF (TOWEL DISPOSABLE) ×2 IMPLANT
TRAP SPECIMEN MUCUS 40CC (MISCELLANEOUS) ×2 IMPLANT
TUBE CONNECTING 20X1/4 (TUBING) ×2 IMPLANT
VALVE BIOPSY SINGLE USE (MISCELLANEOUS) ×2 IMPLANT
VALVE SUCTION BRONCHIO DISP (MISCELLANEOUS) ×2 IMPLANT

## 2024-02-14 NOTE — Anesthesia Procedure Notes (Addendum)
 Procedure Name: Intubation Date/Time: 02/14/2024 8:19 AM  Performed by: Boyce Shilling, CRNAPre-anesthesia Checklist: Patient identified, Emergency Drugs available, Suction available, Timeout performed and Patient being monitored Patient Re-evaluated:Patient Re-evaluated prior to induction Oxygen Delivery Method: Circle system utilized Preoxygenation: Pre-oxygenation with 100% oxygen Induction Type: IV induction Ventilation: Mask ventilation without difficulty Laryngoscope Size: Mac and 4 Grade View: Grade I Tube type: Oral Tube size: 9.0 mm Number of attempts: 1 Airway Equipment and Method: Stylet Placement Confirmation: ETT inserted through vocal cords under direct vision, positive ETCO2, CO2 detector and breath sounds checked- equal and bilateral Secured at: 23 cm Tube secured with: Tape Dental Injury: Teeth and Oropharynx as per pre-operative assessment

## 2024-02-14 NOTE — Brief Op Note (Signed)
 02/14/2024  9:07 AM  PATIENT:  [name]  78 y.o. male  PRE-OPERATIVE DIAGNOSIS:  RIGHT HILAR MASS  POST-OPERATIVE DIAGNOSIS:  RIGHT HILAR MASS  PROCEDURE:  Procedure(s): BRONCHOSCOPY, WITH EBUS (N/A)  SURGEON:  Surgeons and Role:    * Kerrin Elspeth JAYSON, MD - Primary  PHYSICIAN ASSISTANT:   ASSISTANTS: none   ANESTHESIA:   general  EBL:  1 mL   BLOOD ADMINISTERED:none  DRAINS: none   LOCAL MEDICATIONS USED:  NONE  SPECIMEN:  Source of Specimen:  right hilar mass  DISPOSITION OF SPECIMEN:  PATHOLOGY  COUNTS:  NO endo  TOURNIQUET:  * No tourniquets in log *  DICTATION: .Other Dictation: Dictation Number -  PLAN OF CARE: Discharge to home after PACU  PATIENT DISPOSITION:  PACU - hemodynamically stable.   Delay start of Pharmacological VTE agent (>24hrs) due to surgical blood loss or risk of bleeding: not applicable

## 2024-02-14 NOTE — Interval H&P Note (Signed)
 History and Physical Interval Note:  02/14/2024 7:26 AM  [name]  has presented today for surgery, with the diagnosis of RIGHT HILAR MASS.  The various methods of treatment have been discussed with the patient and family. After consideration of risks, benefits and other options for treatment, the patient has consented to  Procedure(s): BRONCHOSCOPY, WITH EBUS (N/A) as a surgical intervention.  The patient's history has been reviewed, patient examined, no change in status, stable for surgery.  I have reviewed the patient's chart and labs.  Questions were answered to the patient's satisfaction.     [name]

## 2024-02-14 NOTE — Op Note (Signed)
 [name], [name] MEDICAL RECORD NO: [id number] ACCOUNT NO: [id number] DATE OF BIRTH: 09-19-1945 FACILITY: [facility] LOCATION: [facility] PHYSICIAN: Elspeth BROCKS. Kerrin, MD  Operative Report   DATE OF PROCEDURE: 02/14/2024  PREOPERATIVE DIAGNOSIS: Right hilar mass.  POSTOPERATIVE DIAGNOSIS: Right hilar mass.  PROCEDURE: Video bronchoscopy, endobronchial ultrasound with transbronchial needle aspirations.  SURGEON: Elspeth BROCKS. Kerrin, MD  ASSISTANT: None.  ANESTHESIA: General.  FINDINGS: Right hilar mass with extrinsic compression of airway at right main stem and bronchus intermedius. Quick prep on initial specimens adequate for diagnosis. No endobronchial lesions seen.  CLINICAL NOTE: [name] is a 78 year old man with a history of lung cancer with a previous right upper lobectomy for a stage I adenocarcinoma in 2007. He developed recurrence in 2017. Biopsy showed adenocarcinoma positive for eGFR, treated initially with Tarceva  and then switched to Tagrisso . Recently, he has had progression of a right hilar mass. Needs biopsy for additional diagnosis and molecular testing. The indications, risks, benefits, and alternatives were discussed in detail with the patient.  He understood and accepted the risks and agreed to proceed.  OPERATIVE NOTE: [name] was brought to the operating room on 02/14/2024. He had induction of general anesthesia and was intubated. Sequential compression devices were placed on the calves for DVT prophylaxis. A Bair Hugger was placed for active  warming.  A timeout was performed. Flexible fiberoptic bronchoscopy was performed via the endotracheal tube. It revealed a well-healed right upper lobe bronchial stump, normal endobronchial anatomy with no endobronchial lesions seen. There was mass effect on the right main stem and extending to the bronchus intermedius below the right upper lobe stump. The bronchoscope was removed.  The endobronchial ultrasound  scope was advanced and the lesion was easily visualized in the area of extrinsic mass effect. Needle aspirations were performed using a 21-gauge needle. Real-time ultrasound visualization was used. Aspirations were obtained alternating suction with slow withdrawal of the inner trocar of the needle. 15 passes were performed with each aspiration. A total of 7 aspirations were performed. The first 3 were placed on the slides and into cell block. The final 4 were placed into cell block for additional tissue. The quick prep on the initial slides was adequate for diagnosis. After performing the final aspiration, the endobronchial ultrasound probe was removed.  The bronchoscope was reinserted. There was minimal blood in the airway. This was irrigated with a dilute epinephrine  solution and there was no ongoing bleeding. The airways were cleared and the patient was then extubated in the operating room and taken  to the post-anesthetic care unit in good condition.   PUS D: 02/14/2024 9:16:04 am T: 02/14/2024 9:24:00 am  JOB: [id number]/ [id number]

## 2024-02-14 NOTE — Anesthesia Postprocedure Evaluation (Signed)
 Anesthesia Post Note  Patient: [name]  Procedure(s) Performed: BRONCHOSCOPY, WITH EBUS     Patient location during evaluation: PACU Anesthesia Type: General Level of consciousness: awake and alert, patient cooperative and oriented Pain management: pain level controlled Vital Signs Assessment: post-procedure vital signs reviewed and stable Respiratory status: spontaneous breathing, nonlabored ventilation and respiratory function stable Cardiovascular status: blood pressure returned to baseline and stable Postop Assessment: no apparent nausea or vomiting and able to ambulate Anesthetic complications: no   No notable events documented.  Last Vitals:  Vitals:   02/14/24 0655 02/14/24 0915  BP: (!) 147/80   Pulse: 69   Resp: 20   Temp: 36.9 C 36.7 C  SpO2: 95%     Last Pain:  Vitals:   02/14/24 0915  TempSrc:   PainSc: 0-No pain                 Deshay Kirstein,E. Juanito Gonyer

## 2024-02-14 NOTE — Transfer of Care (Signed)
 Immediate Anesthesia Transfer of Care Note  Patient: [name]  Procedure(s) Performed: BRONCHOSCOPY, WITH EBUS  Patient Location: PACU  Anesthesia Type:General  Level of Consciousness: awake and alert   Airway & Oxygen Therapy: Patient Spontanous Breathing and Patient connected to nasal cannula oxygen  Post-op Assessment: Report given to RN and Post -op Vital signs reviewed and stable  Post vital signs: Reviewed and stable  Last Vitals:  Vitals Value Taken Time  BP 93/55 02/14/24 09:15  Temp    Pulse 54 02/14/24 09:16  Resp 11 02/14/24 09:16  SpO2 96 % 02/14/24 09:16  Vitals shown include unfiled device data.  Last Pain:  Vitals:   02/14/24 0716  TempSrc:   PainSc: 0-No pain         Complications: No notable events documented.

## 2024-02-14 NOTE — Discharge Instructions (Signed)
 Do not drive or engage in heavy physical activity for 24 hours  You may resume normal activities tomorrow  You may cough up small amounts of blood over the next few days.  You may use acetaminophen  (Tylenol ) if needed for discomfort.  You may use an over-the-counter cough medication and/ or throat lozenges if needed.  Call [phone] if you develop chest pain, shortness of breath, fever > 101 F or cough up more than a tablespoon of blood  Follow up as scheduled with Dr. Sherrod

## 2024-02-14 NOTE — OR Nursing (Signed)
 EBUS scope # [id number] 2.8 Bronchoscope#[id number]

## 2024-02-15 ENCOUNTER — Encounter (HOSPITAL_COMMUNITY): Payer: Self-pay | Admitting: Thoracic Surgery (Cardiothoracic Vascular Surgery)

## 2024-02-16 LAB — CYTOLOGY - NON PAP

## 2024-02-20 NOTE — Progress Notes (Signed)
 Location of tumor and Histology per Pathology Report:  Right lung   Biopsy:    Past/Anticipated interventions by surgeon, if any: right    Past/Anticipated interventions by medical oncology, if any: Dr. Gatha     Pain issues, if any:  {:18581} {PAIN DESCRIPTION:21022940}  SAFETY ISSUES: Prior radiation? {:18581} Pacemaker/ICD? {:18581} Possible current pregnancy? no Is the patient on methotrexate? {:18581}  Current Complaints / other details:  ***     ***

## 2024-02-21 ENCOUNTER — Encounter: Payer: Self-pay | Admitting: Podiatry

## 2024-02-21 ENCOUNTER — Ambulatory Visit
Admission: RE | Admit: 2024-02-21 | Discharge: 2024-02-21 | Disposition: A | Discharge status: Home / Self Care | Source: Ambulatory Visit | Attending: Radiation Oncology | Admitting: Radiation Oncology

## 2024-02-21 ENCOUNTER — Ambulatory Visit: Admitting: Podiatry

## 2024-02-21 ENCOUNTER — Encounter: Payer: Self-pay | Admitting: Radiation Oncology

## 2024-02-21 VITALS — BP 138/81 | HR 66 | Temp 96.9°F | Resp 18 | Ht 72.0 in | Wt 234.8 lb

## 2024-02-21 DIAGNOSIS — C3431 Malignant neoplasm of lower lobe, right bronchus or lung: Secondary | ICD-10-CM | POA: Insufficient documentation

## 2024-02-21 DIAGNOSIS — Z85118 Personal history of other malignant neoplasm of bronchus and lung: Secondary | ICD-10-CM | POA: Diagnosis not present

## 2024-02-21 DIAGNOSIS — Z79890 Hormone replacement therapy (postmenopausal): Secondary | ICD-10-CM | POA: Insufficient documentation

## 2024-02-21 DIAGNOSIS — Z7982 Long term (current) use of aspirin: Secondary | ICD-10-CM | POA: Diagnosis not present

## 2024-02-21 DIAGNOSIS — B351 Tinea unguium: Secondary | ICD-10-CM

## 2024-02-21 DIAGNOSIS — C342 Malignant neoplasm of middle lobe, bronchus or lung: Secondary | ICD-10-CM

## 2024-02-21 DIAGNOSIS — Z8701 Personal history of pneumonia (recurrent): Secondary | ICD-10-CM | POA: Insufficient documentation

## 2024-02-21 DIAGNOSIS — G4730 Sleep apnea, unspecified: Secondary | ICD-10-CM | POA: Diagnosis not present

## 2024-02-21 DIAGNOSIS — E039 Hypothyroidism, unspecified: Secondary | ICD-10-CM | POA: Diagnosis not present

## 2024-02-21 DIAGNOSIS — E7800 Pure hypercholesterolemia, unspecified: Secondary | ICD-10-CM | POA: Insufficient documentation

## 2024-02-21 DIAGNOSIS — Z79899 Other long term (current) drug therapy: Secondary | ICD-10-CM | POA: Insufficient documentation

## 2024-02-21 DIAGNOSIS — I10 Essential (primary) hypertension: Secondary | ICD-10-CM | POA: Diagnosis not present

## 2024-02-21 NOTE — Progress Notes (Signed)
  Subjective:  Patient ID: [name], male    DOB: 11-Nov-1945,   MRN: [id number]  Chief Complaint  Patient presents with   Nail Problem    Follow-up and see how the treatment is doing.  One doesn't look like it's doing too well.    78 y.o. male presents for follow-up of fungal nails.  Relates he finished the course of the Lamisil .  And he has been using the Penlac .  He relates they are getting some better but the left great toenail is still affected.  Denies any other pedal complaints. Denies n/v/f/c.   Past Medical History:  Diagnosis Date   Cholelithiases 07/16/2015   Chronic headaches    states they are well managed and does not have them frequently   Complication of anesthesia 2007   quit breathing with bronchoscopy, had to spend night   Gallstone    History of agent Orange exposure 44-45 yrs ago   History of pneumonia  last 3-4 yrs ago   3 -4 different times   Hypercholesterolemia    Hypertension    Hypothyroidism    lung ca dx'd 2007   lung right   Sinus disease    hx of   Sleep apnea    uses cpap setting opf 12    Objective:  Physical Exam: Vascular: DP/PT pulses 2/4 bilateral. CFT <3 seconds. Normal hair growth on digits. No edema.  Skin. No lacerations or abrasions bilateral feet. Bilateral nails 1-5 except right fourth are thickened dystrophic and discolored with subungual debris.  Some improvement noted mostly to the lesser digits and the left hallux Musculoskeletal: MMT 5/5 bilateral lower extremities in DF, PF, Inversion and Eversion. Deceased ROM in DF of ankle joint.  Neurological: Sensation intact to light touch.   Assessment:   1. Onychomycosis          Plan:  Patient was evaluated and treated and all questions answered. -Examined patient -Discussed treatment options for painful dystrophic nails  -Discussed reculturing nail to see if any fungus still remiains.  -Culture growing t rubrum now and possible some other fungal elements.   -Discussed fungal nail treatment options including oral, topical, and laser treatments.  Patient would like to continue with the topical Penlac .  He has finished the course of Lamisil .  But advised not to take anymore. He is getting improvement and advised it could take up to a year for it to fully go away. -Patient to return i as needed   Asberry Failing, DPM

## 2024-02-21 NOTE — Progress Notes (Signed)
 Radiation Oncology         [phone] ________________________________  Initial Outpatient Consultation  Name: [name] MRN: [id number]  Date: 02/21/2024  DOB: September 20, 1945  RR:Yldjpw, Ardell, MD  [name], MD   REFERRING PHYSICIAN: [name], MD  DIAGNOSIS: There were no encounter diagnoses.  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 male who is accompanied by ***. he is seen as a courtesy of Dr. Gatha for an opinion concerning radiation therapy as part of management for his recurrent lung cancer. The patient was first diagnosed with lung cancer in 2007 s/p right upper lobectomy under the care of Dr. Kerrin on 01/23/2006 and Tarceva  mg by mouth daily as part of the BMS Checkmate 370 clinical trial. In September 2020, his treatment was switched to Tagrisso , 80 mg per day.   During a follow up with Dr. Gatha , patient complained of right side rib pain    To further investigate his symptoms, he then underwent a CT CAP on 01/11/24 showing a slight enlargement of a soft tissue nodule of the superior posterior aspect of the right hilum, on today's examination measuring 3.7 x 2.6 cm, previously 3.3 x 1.9 cm when measured similarly. This is highly concerning for recurrent disease.    A PET scan performed on 01/28/24 showed an enlarging hypermetabolic right lower lobe mass adjacent to the surgical site consistent with recurrent lung cancer and a hypermetabolic 14 mm right lower lobe pulmonary nodule suspicious for a second site of recurrence   Subsequently, he underwent a bronchoscopy with EBUS on 02/14/24 under the care fo Dr. Kerrin. Surgical pathology of right hilar mass FNA indicated adenocarcinoma. Immunohistochemical stains show the tumor cells are positive for TTF-1 and  negative for p40.    PREVIOUS RADIATION THERAPY: No  PAST MEDICAL HISTORY:  Past Medical History:  Diagnosis Date   Cholelithiases 07/16/2015   Chronic headaches    states they are well managed and does not have them frequently   Complication of anesthesia 2007   quit breathing with bronchoscopy, had to spend night   Gallstone    History of agent Orange exposure 44-45 yrs ago   History of pneumonia  last 3-4 yrs ago   3 -4 different times   Hypercholesterolemia    Hypertension    Hypothyroidism    lung ca dx'd 2007   lung right   Sinus disease    hx of   Sleep apnea    uses cpap setting opf 12    PAST SURGICAL HISTORY: Past Surgical History:  Procedure Laterality Date   BIOPSY  05/21/2018   Procedure: BIOPSY;  Surgeon: Lennard Lesta FALCON, MD;  Location: [facility];  Service: Endoscopy;;   BIOPSY  05/11/2021   Procedure: BIOPSY;  Surgeon: Elicia Claw, MD;  Location: [facility];  Service: Gastroenterology;;   COLONOSCOPY WITH PROPOFOL  N/A 03/19/2013   Procedure: COLONOSCOPY WITH PROPOFOL ;  Surgeon: Gladis MARLA Louder, MD;  Location: [facility];  Service: Endoscopy;  Laterality: N/A;   COLONOSCOPY WITH PROPOFOL  N/A 05/11/2021   Procedure: COLONOSCOPY WITH PROPOFOL ;  Surgeon: Elicia Claw, MD;  Location: [facility];  Service: Gastroenterology;  Laterality: N/A;   Cystourethroscopy, [facility] TURP.  04/16/2010   ESOPHAGOGASTRODUODENOSCOPY (EGD) WITH PROPOFOL  N/A 05/21/2018   Procedure: ESOPHAGOGASTRODUODENOSCOPY (EGD) WITH PROPOFOL ;  Surgeon: Lennard Lesta FALCON, MD;  Location: [facility];  Service: Endoscopy;  Laterality: N/A;   LASER ABLATION Right 01/26/2023   Laser ablation of right greater saphenous vein with >20 stab phlebectomies   LASER ABLATION Left 06/15/2023   Laser ablation of left greater saphenous vein   NASAL SINUS SURGERY  04/04/1985   POLYPECTOMY  05/11/2021   Procedure: POLYPECTOMY;  Surgeon: Elicia Claw, MD;  Location: [facility];  Service:  Gastroenterology;;   Right video-assisted thoracoscopy, right upper lobectomy and  01/23/2006   The Olympus video bronchoscope was introduced via the right  12/21/2005   TONSILLECTOMY  04/05/1955   Torn medial and lateral menisci, left knee.  11/06/2006   VIDEO BRONCHOSCOPY WITH ENDOBRONCHIAL ULTRASOUND N/A 02/14/2024   Procedure: BRONCHOSCOPY, WITH EBUS;  Surgeon: Kerrin Elspeth BROCKS, MD;  Location: [facility];  Service: Thoracic;  Laterality: N/A;    FAMILY HISTORY:  Family History  Problem Relation Age of Onset   Hypertension Mother    Varicose Veins Mother    Heart attack Father 37       cause of death   Coronary artery disease Father    Heart attack Brother 98       Heart attack cause of death   Coronary artery disease Brother    Varicose Veins Sister     SOCIAL HISTORY:  Social History   Tobacco Use   Smoking status: Never   Smokeless tobacco: Never  Vaping Use   Vaping status: Never Used  Substance Use Topics   Alcohol use: No    Alcohol/week: 0.0 standard drinks of alcohol   Drug use: No    ALLERGIES:  Allergies  Allergen Reactions   Lisinopril Cough   Prednisone Rash   Furosemide  Rash    MEDICATIONS:  Current Outpatient Medications  Medication Sig Dispense Refill   amitriptyline (ELAVIL) 50 MG tablet Take 50 mg by mouth at bedtime.     amLODipine  (NORVASC ) 10 MG tablet Take 10 mg by mouth daily.     aspirin 81 MG tablet Take 81 mg by mouth every morning.  (Patient taking differently: Take 81 mg by mouth every other day.)     Cholecalciferol 2000 units CAPS Take 2,000 Units by mouth daily.      ciclopirox  (PENLAC ) 8 % solution APPLY A THIN LAYER TOPICALLY TO THE AFFECTED NAIL(S) ONCE DAILY. REMOVE EVERY 7 DAYS WITH ALCOHOL AND REPEAT CYCLE. 6.6 mL 0   gabapentin (NEURONTIN) 300 MG capsule Take 1 capsule (300 mg total) by mouth daily. 30 capsule 3   levETIRAcetam (KEPPRA) 750 MG tablet Take 750 mg by mouth at bedtime.      levothyroxine (SYNTHROID) 112 MCG  tablet Take 112 mcg by mouth daily before breakfast.     metoprolol  succinate (TOPROL -XL) 25 MG 24 hr tablet Take 1 tablet (25 mg total) by mouth daily. 90 tablet 3   Multiple Vitamins-Minerals (CENTRUM SILVER PO) Take 1 tablet by mouth daily.      nitroGLYCERIN  (NITROSTAT ) 0.4 MG SL tablet Place 1 tablet (0.4 mg total) under the tongue every 5 (five) minutes as needed for chest pain. 25 tablet 4   osimertinib  mesylate (TAGRISSO ) 80 MG tablet Take 1 tablet (80 mg total) by mouth daily. 30 tablet 3   polyethylene glycol (MIRALAX / GLYCOLAX) packet Take 17 g by mouth daily. Reported on 08/27/2015     psyllium (METAMUCIL) 58.6 % powder Take 1 packet by mouth daily. Reported on 08/27/2015     rosuvastatin (CRESTOR) 5 MG tablet Take 5 mg by  mouth every morning.      valsartan-hydrochlorothiazide (DIOVAN-HCT) 320-25 MG per tablet Take 1 tablet by mouth every morning.      No current facility-administered medications for this visit.    REVIEW OF SYSTEMS:  A 10+ POINT REVIEW OF SYSTEMS WAS OBTAINED including neurology, dermatology, psychiatry, cardiac, respiratory, lymph, extremities, GI, GU, musculoskeletal, constitutional, reproductive, HEENT. ***   PHYSICAL EXAM:  vitals were not taken for this visit.   General: Alert and oriented, in no acute distress HEENT: Head is normocephalic. Extraocular movements are intact. Oropharynx is clear. Neck: Neck is supple, no palpable cervical or supraclavicular lymphadenopathy. Heart: Regular in rate and rhythm with no murmurs, rubs, or gallops. Chest: Clear to auscultation bilaterally, with no rhonchi, wheezes, or rales. Abdomen: Soft, nontender, nondistended, with no rigidity or guarding. Extremities: No cyanosis or edema. Lymphatics: see Neck Exam Skin: No concerning lesions. Musculoskeletal: symmetric strength and muscle tone throughout. Neurologic: Cranial nerves II through XII are grossly intact. No obvious focalities. Speech is fluent. Coordination is  intact. Psychiatric: Judgment and insight are intact. Affect is appropriate. ***  ECOG = ***  0 - Asymptomatic (Fully active, able to carry on all predisease activities without restriction)  1 - Symptomatic but completely ambulatory (Restricted in physically strenuous activity but ambulatory and able to carry out work of a light or sedentary nature. For example, light housework, office work)  2 - Symptomatic, <50% in bed during the day (Ambulatory and capable of all self care but unable to carry out any work activities. Up and about more than 50% of waking hours)  3 - Symptomatic, >50% in bed, but not bedbound (Capable of only limited self-care, confined to bed or chair 50% or more of waking hours)  4 - Bedbound (Completely disabled. Cannot carry on any self-care. Totally confined to bed or chair)  5 - Death   Raylene MM, Creech RH, Tormey DC, et al. [phone]). Toxicity and response criteria of the [facility]. Am. DOROTHA Bridges. Oncol. 5 (6): 649-55  LABORATORY DATA:  Lab Results  Component Value Date   WBC 4.4 02/12/2024   HGB 12.8 (L) 02/12/2024   HCT 36.9 (L) 02/12/2024   MCV 101.9 (H) 02/12/2024   PLT 121 (L) 02/12/2024   NEUTROABS 4.0 01/08/2024   Lab Results  Component Value Date   NA 134 (L) 02/12/2024   K 3.8 02/12/2024   CL 97 (L) 02/12/2024   CO2 29 02/12/2024   GLUCOSE 97 02/12/2024   BUN 13 02/12/2024   CREATININE 0.88 02/12/2024   CALCIUM 9.6 02/12/2024      RADIOGRAPHY: DG Chest 2 View Result Date: 02/12/2024 CLINICAL DATA:  Preop chest exam.  Upcoming bronchoscopy. EXAM: CHEST - 2 VIEW COMPARISON:  Chest CT 01/09/2024 FINDINGS: Postsurgical change in the right hemithorax with volume loss and scarring. Right perihilar nodule on CT is not well demonstrated by radiograph. No evidence of acute airspace disease, pleural effusion, pulmonary edema or pneumothorax. Stable heart size and mediastinal contours. On limited assessment, no acute osseous  findings. IMPRESSION: Postsurgical change in the right hemithorax with volume loss and scarring. Right perihilar nodule on CT is not well demonstrated by radiograph. Electronically Signed   By: Andrea Gasman M.D.   On: 02/12/2024 17:07   NM PET Image Restage (PS) Skull Base to Thigh (F-18 FDG) Result Date: 01/28/2024 EXAM: PET AND CT SKULL BASE TO MID THIGH 01/26/2024 10:09:56 AM TECHNIQUE: RADIOPHARMACEUTICAL: 11.77 mCi F-18 FDG Uptake time 60 minutes. Glucose level 99  mg/dl. PET imaging was acquired from the base of the skull to the mid thighs. Non-contrast enhanced computed tomography was obtained for attenuation correction and anatomic localization. COMPARISON: CT 01/09/2024, CT 12/20/2022 CLINICAL HISTORY: Non-small cell lung cancer (NSCLC), staging. EOV; 11.77 mCi F18-FDG in RAC by SAW/ALM @ 0856; 99 FBG ; 2007 dx with nsclc; recurrence in 2015, new nodule seen on ct 01/05/24; Currently taking chemo pills FINDINGS: HEAD AND NECK: No metabolically active cervical lymphadenopathy. CHEST: Rounded mass adjacent to the surgical site in the right lower lobe measures 3.8 x 3.1 cm on image 70. This mass is increased in size over time and has now intense metabolic activity with SUV maximal 10.3 on image 70. Small nodule in the right lower lobe measures 14 mm with SUV max equal 4.4 on image 70. No hypermetabolic mediastinal lymph nodes. No supraclavicular adenopathy. ABDOMEN AND PELVIS: Irregular cystic lesion superior to the right kidney without metabolic activity. Gallstones noted. No metabolically active intraperitoneal mass. No metabolically active lymphadenopathy. Physiologic activity within the gastrointestinal and genitourinary systems. BONES AND SOFT TISSUE: No abnormal FDG activity localizes to the bones. No metabolically active aggressive osseous lesion. IMPRESSION: 1. Enlarging hypermetabolic right lower lobe mass adjacent to the surgical site consistent with recurrent lung cancer 2. Hypermetabolic 14  mm right lower lobe pulmonary nodule suspicious for a second site of recurrence 3. No hypermetabolic mediastinal or subclavicular lymphadenopathy. 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  We discussed the natural history of *** and general treatment, highlighting the role of radiotherapy in the management.  We discussed the available radiation techniques, and focused on the details of logistics and delivery.  We reviewed the anticipated acute and late sequelae associated with radiation in this setting.  The patient was encouraged to ask questions that I answered to the best of my ability. *** A patient consent form was discussed and signed.  We retained a copy for our records.  The patient would like to proceed with radiation and will be scheduled for CT simulation.  PLAN: ***    *** minutes of total time was spent for this patient encounter, including preparation, face-to-face counseling with the patient and coordination of care, physical exam, and documentation of the encounter.   ------------------------------------------------  Lynwood CHARM Nasuti, PhD, MD  This document serves as a record of services personally performed by Lynwood Nasuti, MD. It was created on his behalf by Reymundo Cartwright, a trained medical scribe. The creation of this record is based on the scribe's personal observations and the provider's statements to them. This document has been checked and approved by the attending provider.

## 2024-02-22 ENCOUNTER — Other Ambulatory Visit: Payer: Self-pay

## 2024-02-22 NOTE — Progress Notes (Signed)
 The proposed treatment discussed in conference is for discussion purpose only and is not a binding recommendation.  The patients have not been physically examined, or presented with their treatment options.  Therefore, final treatment plans cannot be decided.

## 2024-03-01 ENCOUNTER — Encounter (HOSPITAL_COMMUNITY): Payer: Self-pay

## 2024-03-01 DIAGNOSIS — C3491 Malignant neoplasm of unspecified part of right bronchus or lung: Secondary | ICD-10-CM | POA: Diagnosis not present

## 2024-03-03 DIAGNOSIS — I10 Essential (primary) hypertension: Secondary | ICD-10-CM | POA: Diagnosis not present

## 2024-03-03 DIAGNOSIS — I700 Atherosclerosis of aorta: Secondary | ICD-10-CM | POA: Diagnosis not present

## 2024-03-03 DIAGNOSIS — N400 Enlarged prostate without lower urinary tract symptoms: Secondary | ICD-10-CM | POA: Diagnosis not present

## 2024-03-03 DIAGNOSIS — E785 Hyperlipidemia, unspecified: Secondary | ICD-10-CM | POA: Diagnosis not present

## 2024-03-03 DIAGNOSIS — J439 Emphysema, unspecified: Secondary | ICD-10-CM | POA: Diagnosis not present

## 2024-03-03 DIAGNOSIS — C3490 Malignant neoplasm of unspecified part of unspecified bronchus or lung: Secondary | ICD-10-CM | POA: Diagnosis not present

## 2024-03-04 ENCOUNTER — Encounter (HOSPITAL_COMMUNITY): Payer: Self-pay | Admitting: Thoracic Surgery (Cardiothoracic Vascular Surgery)

## 2024-03-06 ENCOUNTER — Encounter (HOSPITAL_COMMUNITY): Payer: Self-pay

## 2024-03-07 ENCOUNTER — Ambulatory Visit
Admission: RE | Admit: 2024-03-07 | Discharge: 2024-03-07 | Disposition: A | Source: Ambulatory Visit | Attending: Radiation Oncology | Admitting: Radiation Oncology

## 2024-03-07 DIAGNOSIS — Z510 Encounter for antineoplastic radiation therapy: Secondary | ICD-10-CM | POA: Diagnosis present

## 2024-03-07 DIAGNOSIS — C3431 Malignant neoplasm of lower lobe, right bronchus or lung: Secondary | ICD-10-CM

## 2024-03-07 DIAGNOSIS — C342 Malignant neoplasm of middle lobe, bronchus or lung: Secondary | ICD-10-CM | POA: Diagnosis present

## 2024-03-08 ENCOUNTER — Other Ambulatory Visit: Payer: Self-pay

## 2024-03-08 NOTE — Progress Notes (Signed)
 [facility] OFFICE PROGRESS NOTE  [name], MD 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   Biomarker Findings HRD signature - HRDsig Negative Microsatellite status - MS-Stable Tumor Mutational Burden - 5 Muts/Mb Genomic Findings For a complete list of the genes assayed, please refer to the Appendix. EGFR L858R CDK4 amplification ERBB3 amplification MDM2 amplification PIK3CA E545K DNMT3A splice site 1851+2T>C RBM10splice site 1436-2A>C 7 Disease relevant genes with no reportable alterations: ALK, BRAF, ERBB2, KRAS, MET, RET, ROS1  PDL1: 0  C-MET over expression 30%  PRIOR THERAPY: 1) Status post right upper lobectomy under the care of Dr. Kerrin on 01/23/2006.  2) Tarceva  150 mg by mouth daily as part of the BMS checkmate 370 clinical trial. Status post 6 weeks of treatment. 3) Tarceva  100 mg by mouth daily as part of the BMS Checkmate 370 clinical trial. First dose started 07/17/2014 status post 101 months of treatment  CURRENT THERAPY:  1) Tagrisso  80 mg p.o. daily started 12/28/2022.  2) UHRT under the care of Dr. Shannon on 04/03/24  INTERVAL HISTORY: [name] 78 y.o. male returns to the clinic today for a follow-up visit.  The patient was last seen in the clinic by Dr. Sherrod on 02/07/2024.  The patient is currently on oral treatment with Tagrisso .  He tolerates this well overall.   The patient had a repeat PET scan at his last appointment which showed a 1.4 cm right lower lobe pulmonary nodule suspicious for a second site of recurrence and enlarging RLL mass.  Dr. Sherrod recommended endoscopy and biopsy under the care of Dr. Kerrin which was performed on 02/14/2024.  The patient saw a radiation oncology who is undergoing UHRT  at this time under  the care of Dr. Shannon. The last day of radiation is on 04/03/24.  Dr. Shannon is going to treat both sites.  Dr. Sherrod recommended repeat molecular studies to see if he developed a second resistant mutation.  The patient denies any major changes in his health since he was last seen.  He does have an upper respiratory infection and saw his PCP earlier this week.  He was negative for flu and COVID.  Treated with a Z-Pak.  The patient is a has any major changes in his health since he was last seen.  He denies any fever, chills, night sweats, or unexplained weight loss.  He denies any chest pain or hemoptysis.  The patient has an associated cough with his upper respiratory infection secondary to his drainage.  He denies any changes in his baseline dyspnea on exertion.  He denies any nausea, vomiting, diarrhea, or constipation.  Denies any headache or visual changes.  Denies any rashes or skin changes.  He is here today for evaluation to review his molecular studies and discuss the next steps in his care.   MEDICAL HISTORY: Past Medical History:  Diagnosis Date   Cholelithiases 07/16/2015   Chronic headaches    states they are well managed and does not have them frequently   Complication of anesthesia 2007   quit breathing with bronchoscopy, had to spend night   Gallstone    History of agent Orange exposure 44-45 yrs ago   History of pneumonia  last 3-4 yrs ago   3 -4 different times   Hypercholesterolemia  Hypertension    Hypothyroidism    lung ca dx'd 2007   lung right   Sinus disease    hx of   Sleep apnea    uses cpap setting opf 12    ALLERGIES:  is allergic to lisinopril, prednisone, and furosemide .  MEDICATIONS:  Current Outpatient Medications  Medication Sig Dispense Refill   amitriptyline (ELAVIL) 50 MG tablet Take 50 mg by mouth at bedtime.     amLODipine  (NORVASC ) 10 MG tablet Take 10 mg by mouth daily.     aspirin 81 MG tablet Take 81 mg by mouth every morning.   (Patient taking differently: Take 81 mg by mouth every other day.)     Cholecalciferol 2000 units CAPS Take 2,000 Units by mouth daily.      ciclopirox  (PENLAC ) 8 % solution APPLY A THIN LAYER TOPICALLY TO THE AFFECTED NAIL(S) ONCE DAILY. REMOVE EVERY 7 DAYS WITH ALCOHOL AND REPEAT CYCLE. 6.6 mL 0   gabapentin  (NEURONTIN ) 300 MG capsule Take 1 capsule (300 mg total) by mouth daily. 30 capsule 3   levETIRAcetam (KEPPRA) 750 MG tablet Take 750 mg by mouth at bedtime.      levothyroxine (SYNTHROID) 112 MCG tablet Take 112 mcg by mouth daily before breakfast.     metoprolol  succinate (TOPROL -XL) 25 MG 24 hr tablet Take 1 tablet (25 mg total) by mouth daily. 90 tablet 3   Multiple Vitamins-Minerals (CENTRUM SILVER PO) Take 1 tablet by mouth daily.      nitroGLYCERIN  (NITROSTAT ) 0.4 MG SL tablet Place 1 tablet (0.4 mg total) under the tongue every 5 (five) minutes as needed for chest pain. 25 tablet 4   osimertinib  mesylate (TAGRISSO ) 80 MG tablet Take 1 tablet (80 mg total) by mouth daily. 30 tablet 3   polyethylene glycol (MIRALAX / GLYCOLAX) packet Take 17 g by mouth daily. Reported on 08/27/2015     psyllium (METAMUCIL) 58.6 % powder Take 1 packet by mouth daily. Reported on 08/27/2015     rosuvastatin (CRESTOR) 5 MG tablet Take 5 mg by mouth every morning.      valsartan-hydrochlorothiazide (DIOVAN-HCT) 320-25 MG per tablet Take 1 tablet by mouth every morning.      No current facility-administered medications for this visit.    SURGICAL HISTORY:  Past Surgical History:  Procedure Laterality Date   BIOPSY  05/21/2018   Procedure: BIOPSY;  Surgeon: Lennard Lesta FALCON, MD;  Location: [facility];  Service: Endoscopy;;   BIOPSY  05/11/2021   Procedure: BIOPSY;  Surgeon: Elicia Claw, MD;  Location: [facility];  Service: Gastroenterology;;   COLONOSCOPY WITH PROPOFOL  N/A 03/19/2013   Procedure: COLONOSCOPY WITH PROPOFOL ;  Surgeon: Gladis MARLA Louder, MD;  Location: [facility];  Service:  Endoscopy;  Laterality: N/A;   COLONOSCOPY WITH PROPOFOL  N/A 05/11/2021   Procedure: COLONOSCOPY WITH PROPOFOL ;  Surgeon: Elicia Claw, MD;  Location: [facility];  Service: Gastroenterology;  Laterality: N/A;   Cystourethroscopy, [facility] TURP.  04/16/2010   ESOPHAGOGASTRODUODENOSCOPY (EGD) WITH PROPOFOL  N/A 05/21/2018   Procedure: ESOPHAGOGASTRODUODENOSCOPY (EGD) WITH PROPOFOL ;  Surgeon: Lennard Lesta FALCON, MD;  Location: [facility];  Service: Endoscopy;  Laterality: N/A;   LASER ABLATION Right 01/26/2023   Laser ablation of right greater saphenous vein with >20 stab phlebectomies   LASER ABLATION Left 06/15/2023   Laser ablation of left greater saphenous vein   NASAL SINUS SURGERY  04/04/1985   POLYPECTOMY  05/11/2021   Procedure: POLYPECTOMY;  Surgeon: Elicia Claw, MD;  Location: [facility];  Service: Gastroenterology;;  Right video-assisted thoracoscopy, right upper lobectomy and  01/23/2006   The Olympus video bronchoscope was introduced via the right  12/21/2005   TONSILLECTOMY  04/05/1955   Torn medial and lateral menisci, left knee.  11/06/2006   VIDEO BRONCHOSCOPY WITH ENDOBRONCHIAL ULTRASOUND N/A 02/14/2024   Procedure: BRONCHOSCOPY, WITH EBUS;  Surgeon: Kerrin Elspeth BROCKS, MD;  Location: [facility];  Service: Thoracic;  Laterality: N/A;    REVIEW OF SYSTEMS:   Review of Systems  Constitutional: Negative for appetite change, chills, fatigue, fever and unexpected weight change.  HENT: Positive nasal congestion.  Negative for mouth sores, nosebleeds, sore throat and trouble swallowing.   Eyes: Negative for eye problems and icterus.  Respiratory: Stable dyspnea on exertion. Negative for cough, hemoptysis, and wheezing.   Cardiovascular: Negative for chest pain and leg swelling.  Gastrointestinal: Negative for abdominal pain, constipation, diarrhea, nausea and vomiting.  Genitourinary: Negative for bladder incontinence, difficulty urinating, dysuria, frequency and  hematuria.   Musculoskeletal: Negative for back pain, gait problem, neck pain and neck stiffness.  Skin: Negative for itching and rash.  Neurological: Negative for dizziness, extremity weakness, gait problem, headaches, light-headedness and seizures.  Hematological: Negative for adenopathy. Does not bruise/bleed easily.  Psychiatric/Behavioral: Negative for confusion, depression and sleep disturbance. The patient is not nervous/anxious.     PHYSICAL EXAMINATION:  There were no vitals taken for this visit.  ECOG PERFORMANCE STATUS: 1  Physical Exam  Constitutional: Oriented to person, place, and time and well-developed, well-nourished, and in no distress.  HENT:  Head: Normocephalic and atraumatic.  Mouth/Throat: Oropharynx is clear and moist. No oropharyngeal exudate.  Eyes: Conjunctivae are normal. Right eye exhibits no discharge. Left eye exhibits no discharge. No scleral icterus.  Neck: Normal range of motion. Neck supple.  Cardiovascular: Normal rate, regular rhythm, normal heart sounds and intact distal pulses.   Pulmonary/Chest: Effort normal and breath sounds normal. No respiratory distress. No wheezes. No rales.  Abdominal: Soft. Bowel sounds are normal. Exhibits no distension and no mass. There is no tenderness.  Musculoskeletal: Normal range of motion. Exhibits no edema.  Lymphadenopathy:    No cervical adenopathy.  Neurological: Alert and oriented to person, place, and time. Exhibits normal muscle tone. Gait normal. Coordination normal.  Skin: Skin is warm and dry. No rash noted. Not diaphoretic. No erythema. No pallor.  Psychiatric: Mood, memory and judgment normal.  Vitals reviewed.  LABORATORY DATA: Lab Results  Component Value Date   WBC 4.4 02/12/2024   HGB 12.8 (L) 02/12/2024   HCT 36.9 (L) 02/12/2024   MCV 101.9 (H) 02/12/2024   PLT 121 (L) 02/12/2024      Chemistry      Component Value Date/Time   NA 134 (L) 02/12/2024 1140   NA 141 03/11/2019 0959    NA 139 04/06/2017 0932   K 3.8 02/12/2024 1140   K 3.8 04/06/2017 0932   CL 97 (L) 02/12/2024 1140   CL 102 04/24/2012 0853   CO2 29 02/12/2024 1140   CO2 29 04/06/2017 0932   BUN 13 02/12/2024 1140   BUN 18 03/11/2019 0959   BUN 14.4 04/06/2017 0932   CREATININE 0.88 02/12/2024 1140   CREATININE 1.15 01/08/2024 0844   CREATININE 1.0 04/06/2017 0932      Component Value Date/Time   CALCIUM 9.6 02/12/2024 1140   CALCIUM 9.5 04/06/2017 0932   ALKPHOS 74 02/12/2024 1140   ALKPHOS 58 04/06/2017 0932   AST 46 (H) 02/12/2024 1140   AST 26 01/08/2024 0844  AST 27 04/06/2017 0932   ALT 36 02/12/2024 1140   ALT 22 01/08/2024 0844   ALT 27 04/06/2017 0932   BILITOT 0.7 02/12/2024 1140   BILITOT 0.5 01/08/2024 0844   BILITOT 0.45 04/06/2017 0932       RADIOGRAPHIC STUDIES:  DG Chest 2 View Result Date: 02/12/2024 CLINICAL DATA:  Preop chest exam.  Upcoming bronchoscopy. EXAM: CHEST - 2 VIEW COMPARISON:  Chest CT 01/09/2024 FINDINGS: Postsurgical change in the right hemithorax with volume loss and scarring. Right perihilar nodule on CT is not well demonstrated by radiograph. No evidence of acute airspace disease, pleural effusion, pulmonary edema or pneumothorax. Stable heart size and mediastinal contours. On limited assessment, no acute osseous findings. IMPRESSION: Postsurgical change in the right hemithorax with volume loss and scarring. Right perihilar nodule on CT is not well demonstrated by radiograph. Electronically Signed   By: Andrea Gasman M.D.   On: 02/12/2024 17:07     ASSESSMENT/PLAN:  This is a very pleasant 78 year old Caucasian male with recurrent non-small cell lung cancer, adenocarcinoma.  He is positive for eGFR mutation in exon 21.  He was on Tarceva  100 mg by mouth daily.  He had evidence of disease progression and was switched to Tagrisso  80 mg p.o. daily in September 2024.  He had a PET scan performed recently that showed the enlarging hypermetabolic right  lower lobe mass adjacent to the surgical site consistent with recurrent lung cancer. There was also hypermetabolic 1.4 cm right lower lobe pulmonary nodule suspicious for second site of recurrence.   The patient had a molecular studies which showed PD-L1 expression of 0, overexpression of 30%, and no additional resistant mutations.  He is currently undergoing UH RT to the enlarging nodule under the care of Dr. Shannon which is scheduled to be completed on 04/03/2024.  The patient was seen by Dr. Sherrod today.  Dr. Sherrod had a lengthy discussion with the patient today about his current condition and treatment options.  Dr. Sherrod recommends that the patient continue with radiation at this time.  We will arrange for repeat labs and follow-up in 3 months with repeat CT scan 1 week prior.  If the patient has any future progression he may consider adding additional systemic treatment/chemotherapy with his Tagrisso .  Will see him back for labs and a follow-up visit in 3 months and to review his repeat CT scan.  The patient was advised to call immediately if he has any concerning symptoms in the interval. The patient voices understanding of current disease status and treatment options and is in agreement with the current care plan. All questions were answered. The patient knows to call the clinic with any problems, questions or concerns. We can certainly see the patient much sooner if necessary   No orders of the defined types were placed in this encounter.     Michaell Grider L Ruslan Mccabe, PA-C 03/08/24  ADDENDUM: Hematology/Oncology Attending: I had a face-to-face encounter with the patient today.  I reviewed his record, lab, previous scan as well as recent molecular studies.  This is a very pleasant 78 years old white male with recurrent non-small cell lung cancer that was initially diagnosed as a stage Ia in September 2007 with positive eGFR mutation in exon 21 (L858R).  The patient was on  treatment with Tarceva  for more than 103 months but this was discontinued in September 2024 secondary to disease progression.  He started treatment with Tagrisso  80 mg p.o. daily since September 2024.  He has  been tolerating the treatment well.  Imaging studies performed recently including CT scan of the chest as well as PET scan showed enlargement of 1.4 cm right lower lobe pulmonary nodule in addition to the enlarging 3.8 x 3.1 cm ground glass adjacent to the surgical site in the right lower lobe.  These nodules were biopsied and were consistent with adenocarcinoma of the lung.  The tissue was sent for molecular studies and that showed no additional actionable mutation and the patient continues to have the persistent eGFR exon 21, L858R.  He has c-Met expression of 30% and negative PD-L1 expression. I recommended for the patient to continue his current treatment with Tagrisso  and he will see Dr. Shannon from radiation oncology for consideration of SBRT to these progressive pulmonary nodules. He will come back for follow-up visit in 3 months for reevaluation and repeat blood work as well as imaging studies.. The patient was advised to call immediately if he has any other concerning symptoms in the interval. Disclaimer: This note was dictated with voice recognition software. Similar sounding words can inadvertently be transcribed and may be missed upon review. Sherrod MARLA Sherrod, MD

## 2024-03-11 DIAGNOSIS — R0989 Other specified symptoms and signs involving the circulatory and respiratory systems: Secondary | ICD-10-CM | POA: Diagnosis not present

## 2024-03-11 DIAGNOSIS — I10 Essential (primary) hypertension: Secondary | ICD-10-CM | POA: Diagnosis not present

## 2024-03-11 DIAGNOSIS — Z20822 Contact with and (suspected) exposure to covid-19: Secondary | ICD-10-CM | POA: Diagnosis not present

## 2024-03-11 DIAGNOSIS — R0981 Nasal congestion: Secondary | ICD-10-CM | POA: Diagnosis not present

## 2024-03-11 DIAGNOSIS — J029 Acute pharyngitis, unspecified: Secondary | ICD-10-CM | POA: Diagnosis not present

## 2024-03-11 DIAGNOSIS — R051 Acute cough: Secondary | ICD-10-CM | POA: Diagnosis not present

## 2024-03-12 ENCOUNTER — Other Ambulatory Visit: Payer: Self-pay | Admitting: Pharmacy Technician

## 2024-03-12 ENCOUNTER — Other Ambulatory Visit: Payer: Self-pay

## 2024-03-12 NOTE — Progress Notes (Signed)
 Specialty Pharmacy Refill Coordination Note  [name] is a 78 y.o. male contacted today regarding refills of specialty medication(s) Osimertinib  Mesylate (TAGRISSO )   Patient requested Pickup at [facility] at [place] date: 03/15/24   Medication will be filled on: 03/14/24

## 2024-03-13 ENCOUNTER — Inpatient Hospital Stay (HOSPITAL_BASED_OUTPATIENT_CLINIC_OR_DEPARTMENT_OTHER): Admitting: Physician Assistant

## 2024-03-13 ENCOUNTER — Inpatient Hospital Stay: Attending: Internal Medicine

## 2024-03-13 ENCOUNTER — Other Ambulatory Visit: Payer: Self-pay

## 2024-03-13 VITALS — BP 129/60 | HR 73 | Temp 98.0°F | Resp 13 | Wt 233.3 lb

## 2024-03-13 DIAGNOSIS — C3431 Malignant neoplasm of lower lobe, right bronchus or lung: Secondary | ICD-10-CM

## 2024-03-13 DIAGNOSIS — Z85118 Personal history of other malignant neoplasm of bronchus and lung: Secondary | ICD-10-CM | POA: Insufficient documentation

## 2024-03-13 DIAGNOSIS — Z902 Acquired absence of lung [part of]: Secondary | ICD-10-CM | POA: Diagnosis not present

## 2024-03-13 LAB — CBC WITH DIFFERENTIAL (CANCER CENTER ONLY)
Abs Immature Granulocytes: 0.03 K/uL (ref 0.00–0.07)
Basophils Absolute: 0 K/uL (ref 0.0–0.1)
Basophils Relative: 1 %
Eosinophils Absolute: 0.3 K/uL (ref 0.0–0.5)
Eosinophils Relative: 6 %
HCT: 33.5 % — ABNORMAL LOW (ref 39.0–52.0)
Hemoglobin: 11.8 g/dL — ABNORMAL LOW (ref 13.0–17.0)
Immature Granulocytes: 1 %
Lymphocytes Relative: 36 %
Lymphs Abs: 1.5 K/uL (ref 0.7–4.0)
MCH: 35 pg — ABNORMAL HIGH (ref 26.0–34.0)
MCHC: 35.2 g/dL (ref 30.0–36.0)
MCV: 99.4 fL (ref 80.0–100.0)
Monocytes Absolute: 0.6 K/uL (ref 0.1–1.0)
Monocytes Relative: 15 %
Neutro Abs: 1.8 K/uL (ref 1.7–7.7)
Neutrophils Relative %: 41 %
Platelet Count: 138 K/uL — ABNORMAL LOW (ref 150–400)
RBC: 3.37 MIL/uL — ABNORMAL LOW (ref 4.22–5.81)
RDW: 12.5 % (ref 11.5–15.5)
WBC Count: 4.2 K/uL (ref 4.0–10.5)
nRBC: 0 % (ref 0.0–0.2)

## 2024-03-13 LAB — CMP (CANCER CENTER ONLY)
ALT: 50 U/L — ABNORMAL HIGH (ref 0–44)
AST: 57 U/L — ABNORMAL HIGH (ref 15–41)
Albumin: 4.1 g/dL (ref 3.5–5.0)
Alkaline Phosphatase: 78 U/L (ref 38–126)
Anion gap: 9 (ref 5–15)
BUN: 18 mg/dL (ref 8–23)
CO2: 31 mmol/L (ref 22–32)
Calcium: 9.7 mg/dL (ref 8.9–10.3)
Chloride: 96 mmol/L — ABNORMAL LOW (ref 98–111)
Creatinine: 0.87 mg/dL (ref 0.61–1.24)
GFR, Estimated: >60 mL/min (ref >60)
Glucose, Bld: 97 mg/dL (ref 70–99)
Potassium: 3.6 mmol/L (ref 3.5–5.1)
Sodium: 136 mmol/L (ref 135–145)
Total Bilirubin: 0.6 mg/dL (ref 0.0–1.2)
Total Protein: 6.7 g/dL (ref 6.5–8.1)

## 2024-03-14 ENCOUNTER — Inpatient Hospital Stay: Admitting: Physician Assistant

## 2024-03-14 ENCOUNTER — Inpatient Hospital Stay

## 2024-03-17 DIAGNOSIS — Z510 Encounter for antineoplastic radiation therapy: Secondary | ICD-10-CM | POA: Diagnosis not present

## 2024-03-19 ENCOUNTER — Ambulatory Visit
Admission: RE | Admit: 2024-03-19 | Discharge: 2024-03-19 | Disposition: A | Source: Ambulatory Visit | Attending: Radiation Oncology | Admitting: Radiation Oncology

## 2024-03-19 ENCOUNTER — Other Ambulatory Visit: Payer: Self-pay

## 2024-03-19 ENCOUNTER — Ambulatory Visit
Admission: RE | Admit: 2024-03-19 | Discharge: 2024-03-19 | Attending: Radiation Oncology | Admitting: Radiation Oncology

## 2024-03-19 DIAGNOSIS — Z510 Encounter for antineoplastic radiation therapy: Secondary | ICD-10-CM | POA: Diagnosis not present

## 2024-03-19 DIAGNOSIS — C3431 Malignant neoplasm of lower lobe, right bronchus or lung: Secondary | ICD-10-CM

## 2024-03-19 LAB — RAD ONC ARIA SESSION SUMMARY
Course Elapsed Days: 0
Plan Fractions Treated to Date: 1
Plan Prescribed Dose Per Fraction: 5 Gy
Plan Total Fractions Prescribed: 10
Plan Total Prescribed Dose: 50 Gy
Reference Point Dosage Given to Date: 5 Gy
Reference Point Session Dosage Given: 5 Gy
Session Number: 1

## 2024-03-19 NOTE — Progress Notes (Signed)
 Specialty Pharmacy Ongoing Clinical Assessment Note  [name] is a 78 y.o. male who is being followed by the specialty pharmacy service for RxSp Oncology   Patient's specialty medication(s) reviewed today: Osimertinib  Mesylate (TAGRISSO )   Missed doses in the last 4 weeks: 0   Patient/Caregiver did not have any additional questions or concerns.   Therapeutic benefit summary: Patient is achieving benefit   Adverse events/side effects summary: No adverse events/side effects   Patient's therapy is appropriate to: Continue    Goals Addressed             This Visit's Progress    Slow Disease Progression   Worsening    Patient is not on track and worsening. Patient will maintain adherence and be evaluated by provider to determine if a change in therapy is warranted. Per office visit notes from 03/13/24, patient did have some progression and the provider is recommending continuing with the current treatment and referring to radiation oncology to treat these nodules.           Follow up: 6 months  Silvano LOISE Dolly Specialty Pharmacist

## 2024-03-20 ENCOUNTER — Other Ambulatory Visit: Payer: Self-pay

## 2024-03-20 ENCOUNTER — Ambulatory Visit
Admission: RE | Admit: 2024-03-20 | Discharge: 2024-03-20 | Attending: Radiation Oncology | Admitting: Radiation Oncology

## 2024-03-20 DIAGNOSIS — C3431 Malignant neoplasm of lower lobe, right bronchus or lung: Secondary | ICD-10-CM

## 2024-03-20 DIAGNOSIS — Z510 Encounter for antineoplastic radiation therapy: Secondary | ICD-10-CM | POA: Diagnosis not present

## 2024-03-20 LAB — RAD ONC ARIA SESSION SUMMARY
Course Elapsed Days: 1
Plan Fractions Treated to Date: 2
Plan Prescribed Dose Per Fraction: 5 Gy
Plan Total Fractions Prescribed: 10
Plan Total Prescribed Dose: 50 Gy
Reference Point Dosage Given to Date: 10 Gy
Reference Point Session Dosage Given: 5 Gy
Session Number: 2

## 2024-03-21 ENCOUNTER — Ambulatory Visit
Admission: RE | Admit: 2024-03-21 | Discharge: 2024-03-21 | Attending: Radiation Oncology | Admitting: Radiation Oncology

## 2024-03-21 ENCOUNTER — Other Ambulatory Visit: Payer: Self-pay

## 2024-03-21 DIAGNOSIS — Z510 Encounter for antineoplastic radiation therapy: Secondary | ICD-10-CM | POA: Diagnosis not present

## 2024-03-21 DIAGNOSIS — C3431 Malignant neoplasm of lower lobe, right bronchus or lung: Secondary | ICD-10-CM

## 2024-03-21 LAB — RAD ONC ARIA SESSION SUMMARY
Course Elapsed Days: 2
Plan Fractions Treated to Date: 3
Plan Prescribed Dose Per Fraction: 5 Gy
Plan Total Fractions Prescribed: 10
Plan Total Prescribed Dose: 50 Gy
Reference Point Dosage Given to Date: 15 Gy
Reference Point Session Dosage Given: 5 Gy
Session Number: 3

## 2024-03-22 ENCOUNTER — Other Ambulatory Visit: Payer: Self-pay

## 2024-03-22 ENCOUNTER — Ambulatory Visit
Admission: RE | Admit: 2024-03-22 | Discharge: 2024-03-22 | Attending: Radiation Oncology | Admitting: Radiation Oncology

## 2024-03-22 DIAGNOSIS — Z510 Encounter for antineoplastic radiation therapy: Secondary | ICD-10-CM | POA: Diagnosis not present

## 2024-03-22 LAB — RAD ONC ARIA SESSION SUMMARY
Course Elapsed Days: 3
Plan Fractions Treated to Date: 4
Plan Prescribed Dose Per Fraction: 5 Gy
Plan Total Fractions Prescribed: 10
Plan Total Prescribed Dose: 50 Gy
Reference Point Dosage Given to Date: 20 Gy
Reference Point Session Dosage Given: 5 Gy
Session Number: 4

## 2024-03-25 ENCOUNTER — Other Ambulatory Visit: Payer: Self-pay

## 2024-03-25 ENCOUNTER — Ambulatory Visit
Admission: RE | Admit: 2024-03-25 | Discharge: 2024-03-25 | Attending: Radiation Oncology | Admitting: Radiation Oncology

## 2024-03-25 DIAGNOSIS — Z510 Encounter for antineoplastic radiation therapy: Secondary | ICD-10-CM | POA: Diagnosis not present

## 2024-03-25 DIAGNOSIS — C3431 Malignant neoplasm of lower lobe, right bronchus or lung: Secondary | ICD-10-CM

## 2024-03-25 LAB — RAD ONC ARIA SESSION SUMMARY
Course Elapsed Days: 6
Plan Fractions Treated to Date: 5
Plan Prescribed Dose Per Fraction: 5 Gy
Plan Total Fractions Prescribed: 10
Plan Total Prescribed Dose: 50 Gy
Reference Point Dosage Given to Date: 25 Gy
Reference Point Session Dosage Given: 5 Gy
Session Number: 5

## 2024-03-26 ENCOUNTER — Ambulatory Visit
Admission: RE | Admit: 2024-03-26 | Discharge: 2024-03-26 | Disposition: A | Discharge status: Home / Self Care | Source: Ambulatory Visit | Attending: Radiation Oncology | Admitting: Radiation Oncology

## 2024-03-26 ENCOUNTER — Other Ambulatory Visit: Payer: Self-pay

## 2024-03-26 DIAGNOSIS — Z510 Encounter for antineoplastic radiation therapy: Secondary | ICD-10-CM | POA: Diagnosis not present

## 2024-03-26 DIAGNOSIS — C3431 Malignant neoplasm of lower lobe, right bronchus or lung: Secondary | ICD-10-CM

## 2024-03-26 LAB — RAD ONC ARIA SESSION SUMMARY
Course Elapsed Days: 7
Plan Fractions Treated to Date: 6
Plan Prescribed Dose Per Fraction: 5 Gy
Plan Total Fractions Prescribed: 10
Plan Total Prescribed Dose: 50 Gy
Reference Point Dosage Given to Date: 30 Gy
Reference Point Session Dosage Given: 5 Gy
Session Number: 6

## 2024-03-27 ENCOUNTER — Other Ambulatory Visit: Payer: Self-pay

## 2024-03-27 ENCOUNTER — Ambulatory Visit
Admission: RE | Admit: 2024-03-27 | Discharge: 2024-03-27 | Disposition: A | Source: Ambulatory Visit | Attending: Radiation Oncology | Admitting: Radiation Oncology

## 2024-03-27 DIAGNOSIS — C3431 Malignant neoplasm of lower lobe, right bronchus or lung: Secondary | ICD-10-CM

## 2024-03-27 DIAGNOSIS — Z510 Encounter for antineoplastic radiation therapy: Secondary | ICD-10-CM | POA: Diagnosis not present

## 2024-03-27 LAB — RAD ONC ARIA SESSION SUMMARY
Course Elapsed Days: 8
Plan Fractions Treated to Date: 7
Plan Prescribed Dose Per Fraction: 5 Gy
Plan Total Fractions Prescribed: 10
Plan Total Prescribed Dose: 50 Gy
Reference Point Dosage Given to Date: 35 Gy
Reference Point Session Dosage Given: 5 Gy
Session Number: 7

## 2024-04-01 ENCOUNTER — Ambulatory Visit
Admission: RE | Admit: 2024-04-01 | Discharge: 2024-04-01 | Disposition: A | Source: Ambulatory Visit | Attending: Radiation Oncology | Admitting: Radiation Oncology

## 2024-04-01 ENCOUNTER — Other Ambulatory Visit: Payer: Self-pay

## 2024-04-01 DIAGNOSIS — Z510 Encounter for antineoplastic radiation therapy: Secondary | ICD-10-CM | POA: Diagnosis not present

## 2024-04-01 DIAGNOSIS — C3431 Malignant neoplasm of lower lobe, right bronchus or lung: Secondary | ICD-10-CM

## 2024-04-01 LAB — RAD ONC ARIA SESSION SUMMARY
Course Elapsed Days: 13
Plan Fractions Treated to Date: 8
Plan Prescribed Dose Per Fraction: 5 Gy
Plan Total Fractions Prescribed: 10
Plan Total Prescribed Dose: 50 Gy
Reference Point Dosage Given to Date: 40 Gy
Reference Point Session Dosage Given: 5 Gy
Session Number: 8

## 2024-04-02 ENCOUNTER — Ambulatory Visit
Admission: RE | Admit: 2024-04-02 | Discharge: 2024-04-02 | Disposition: A | Discharge status: Home / Self Care | Source: Ambulatory Visit | Attending: Radiation Oncology | Admitting: Radiation Oncology

## 2024-04-02 ENCOUNTER — Ambulatory Visit

## 2024-04-02 ENCOUNTER — Other Ambulatory Visit: Payer: Self-pay

## 2024-04-02 DIAGNOSIS — C3431 Malignant neoplasm of lower lobe, right bronchus or lung: Secondary | ICD-10-CM

## 2024-04-02 DIAGNOSIS — Z510 Encounter for antineoplastic radiation therapy: Secondary | ICD-10-CM | POA: Diagnosis not present

## 2024-04-02 LAB — RAD ONC ARIA SESSION SUMMARY
Course Elapsed Days: 14
Plan Fractions Treated to Date: 9
Plan Prescribed Dose Per Fraction: 5 Gy
Plan Total Fractions Prescribed: 10
Plan Total Prescribed Dose: 50 Gy
Reference Point Dosage Given to Date: 45 Gy
Reference Point Session Dosage Given: 5 Gy
Session Number: 9

## 2024-04-03 ENCOUNTER — Ambulatory Visit
Admission: RE | Admit: 2024-04-03 | Discharge: 2024-04-03 | Disposition: A | Discharge status: Home / Self Care | Source: Ambulatory Visit | Attending: Radiation Oncology | Admitting: Radiation Oncology

## 2024-04-03 ENCOUNTER — Other Ambulatory Visit: Payer: Self-pay

## 2024-04-03 DIAGNOSIS — Z510 Encounter for antineoplastic radiation therapy: Secondary | ICD-10-CM | POA: Diagnosis not present

## 2024-04-03 DIAGNOSIS — C3431 Malignant neoplasm of lower lobe, right bronchus or lung: Secondary | ICD-10-CM

## 2024-04-03 LAB — RAD ONC ARIA SESSION SUMMARY
Course Elapsed Days: 15
Plan Fractions Treated to Date: 10
Plan Prescribed Dose Per Fraction: 5 Gy
Plan Total Fractions Prescribed: 10
Plan Total Prescribed Dose: 50 Gy
Reference Point Dosage Given to Date: 50 Gy
Reference Point Session Dosage Given: 5 Gy
Session Number: 10

## 2024-04-04 NOTE — Radiation Completion Notes (Addendum)
" °  Radiation Oncology         [phone] ________________________________  Name: [name] MRN: [id number]  Date of Service: 04/03/2024  DOB: Nov 12, 1945  End of Treatment Note   Diagnosis: Recurrent stage IA (T1a, N0, MX) NSCLC Intent: Curative     ==========DELIVERED PLANS==========  First Treatment Date: 2024-03-19 Last Treatment Date: 2024-04-03   Plan Name: Lung_R_UHRT Site: Lung, Right Technique: IMRT Mode: Photon Dose Per Fraction: 5 Gy Prescribed Dose (Delivered / Prescribed): 50 Gy / 50 Gy Prescribed Fxs (Delivered / Prescribed): 10 / 10    ====================================   The patient tolerated radiation well. He developed fatigue and experienced a dry cough throughout his treatment.   The patient will return in one month and will continue follow up with Dr. Sherrod as well.      Ronita Due, PA-C "

## 2024-04-05 ENCOUNTER — Other Ambulatory Visit: Payer: Self-pay | Admitting: Internal Medicine

## 2024-04-05 ENCOUNTER — Other Ambulatory Visit: Payer: Self-pay

## 2024-04-05 ENCOUNTER — Telehealth: Payer: Self-pay

## 2024-04-05 ENCOUNTER — Other Ambulatory Visit (HOSPITAL_COMMUNITY): Payer: Self-pay

## 2024-04-05 DIAGNOSIS — C3431 Malignant neoplasm of lower lobe, right bronchus or lung: Secondary | ICD-10-CM

## 2024-04-05 NOTE — Telephone Encounter (Signed)
 Oral Oncology Patient Advocate Encounter  Was successful in securing patient a $6000 grant from [facility] to provide copayment coverage for Tagrisso .  This will keep the out of pocket expense at $0.     [employer] ID: [id number]   The billing information is as follows and has been shared with [facility].    RxBin: [id number] PCN: [id number] Member ID: [id number]  Group ID: [id number] Dates of Eligibility: 03/06/24 through 03/05/25  Fund:  NSCLC  Lucie Lamer, CPhT [place]  [facility] Oncology Pharmacy Patient Advocate Specialist II [facility] Phone: [phone]  Fax: [phone] [email or web address]

## 2024-04-06 MED ORDER — OSIMERTINIB MESYLATE 80 MG PO TABS
80.0000 mg | ORAL_TABLET | Freq: Every day | ORAL | 3 refills | Status: AC
  Filled 2024-04-08 (×2): qty 30, 30d supply, fill #0
  Filled 2024-05-07 – 2024-05-09 (×2): qty 30, 30d supply, fill #1

## 2024-04-08 ENCOUNTER — Other Ambulatory Visit: Payer: Self-pay

## 2024-04-08 NOTE — Progress Notes (Signed)
 Specialty Pharmacy Refill Coordination Note  [name] is a 79 y.o. male contacted today regarding refills of specialty medication(s) Osimertinib  Mesylate (TAGRISSO )   Patient requested Pickup at [facility] at [place] date: 04/11/24   Medication will be filled on: 04/10/24

## 2024-04-09 NOTE — Addendum Note (Signed)
 Encounter addended by: Wyatt Leeroy HERO, PA-C on: 04/09/2024 3:36 PM  Actions taken: Clinical Note Signed

## 2024-04-30 NOTE — Progress Notes (Signed)
 "  Radiation Oncology         [phone] ________________________________  Name: [name] MRN: [id number]  Date: 05/07/2024  DOB: 06-18-1945  Follow-Up Visit Note  CC: Andrew Other, MD  [name], MD    ICD-10-CM   1. Malignant neoplasm of lower lobe of right lung [facility])  C34.31        Diagnosis:   Recurrent stage IA (T1a, N0, MX) NSCLC   Previous Treatment and Interval Since Last Radiation:  appoximately 1 month  ==========DELIVERED PLANS==========  First Treatment Date: 2024-03-19 Last Treatment Date: 2024-04-03   Plan Name: Lung_R_UHRT Site: Lung, Right Technique: IMRT Mode: Photon Dose Per Fraction: 5 Gy Prescribed Dose (Delivered / Prescribed): 50 Gy / 50 Gy Prescribed Fxs (Delivered / Prescribed): 10 / 10    ====================================   Narrative:  The patient returns today for routine follow-up. He completed his treatment approximately one month ago. Throughout his treatment, he developed fatigue and experienced a dry cough.        Today, the patient reports that his energy levels have returned to baseline and he is no longer experiencing a dry cough. He notes an approximate one week history of a rash to his arms with extension of the rash to his hands two days ago. The rash is pruritic, but not painful. He denies any new lotions, detergents, or medications. He continues to take his Tagrisso  as prescribed.                  ALLERGIES:  is allergic to lisinopril, prednisone, and furosemide .  Meds: Current Outpatient Medications  Medication Sig Dispense Refill   amitriptyline (ELAVIL) 50 MG tablet Take 50 mg by mouth at bedtime.     amLODipine  (NORVASC ) 10 MG tablet Take 10 mg by mouth daily.     aspirin 81 MG tablet Take 81 mg by mouth every morning.  (Patient taking differently: Take 81 mg by mouth every Coleman day.)     Cholecalciferol 2000 units CAPS Take 2,000 Units by mouth daily.      ciclopirox  (PENLAC ) 8 % solution APPLY A THIN LAYER  TOPICALLY TO THE AFFECTED NAIL(S) ONCE DAILY. REMOVE EVERY 7 DAYS WITH ALCOHOL AND REPEAT CYCLE. 6.6 mL 0   doxycycline  (VIBRA -TABS) 100 MG tablet Take 1 tablet (100 mg total) by mouth 2 (two) times daily. 14 tablet 0   levETIRAcetam (KEPPRA) 750 MG tablet Take 750 mg by mouth at bedtime.      levothyroxine (SYNTHROID) 112 MCG tablet Take 112 mcg by mouth daily before breakfast.     metoprolol  succinate (TOPROL -XL) 25 MG 24 hr tablet Take 1 tablet (25 mg total) by mouth daily. 90 tablet 3   Multiple Vitamins-Minerals (CENTRUM SILVER PO) Take 1 tablet by mouth daily.      nitroGLYCERIN  (NITROSTAT ) 0.4 MG SL tablet Place 1 tablet (0.4 mg total) under the tongue every 5 (five) minutes as needed for chest pain. 25 tablet 4   osimertinib  mesylate (TAGRISSO ) 80 MG tablet Take 1 tablet (80 mg total) by mouth daily. 30 tablet 3   polyethylene glycol (MIRALAX / GLYCOLAX) packet Take 17 g by mouth daily. Reported on 08/27/2015     psyllium (METAMUCIL) 58.6 % powder Take 1 packet by mouth daily. Reported on 08/27/2015     rosuvastatin (CRESTOR) 5 MG tablet Take 5 mg by mouth every morning.      triamcinolone  cream (KENALOG ) 0.1 % Apply 1 Application topically 2 (two) times daily. 453.6 g 0  valsartan-hydrochlorothiazide (DIOVAN-HCT) 320-25 MG per tablet Take 1 tablet by mouth every morning.      No current facility-administered medications for this encounter.    Physical Findings:  height is 6' (1.829 m) and weight is 241 lb 3.2 oz (109.4 kg). His temperature is 97.5 F (36.4 C) (abnormal). His blood pressure is 130/69 and his pulse is 66. His respiration is 20 and oxygen saturation is 99%. .   The patient is in no acute distress. Patient is alert and oriented. No significant changes. Lungs are clear to auscultation bilaterally. Heart has regular rate and rhythm. No palpable cervical, supraclavicular, or axillary adenopathy. Abdomen soft, non-tender, normal bowel sounds.   Rash on bilateral forearms and  base of digits.    Right Forearm:   Right Hand:    Left forearm:    Left hand:           Lab Findings: Lab Results  Component Value Date   WBC 4.2 03/13/2024   HGB 11.8 (L) 03/13/2024   HCT 33.5 (L) 03/13/2024   MCV 99.4 03/13/2024   PLT 138 (L) 03/13/2024    Radiographic Findings: No results found.  Impression/Plan:  Recurrent stage IA (T1a, N0, MX) NSCLC; s/p radiation completed on 04/03/2024   [name] has healed well from the effects of their radiation. He presents today with a new rash on his bilateral arms and hands. I have reached out to [employer] who believes this could be possibly related to his Tragrisso. She has prescribed a trial of triamcinolone  cream to help with this. Patient understands to call back if rash worsens or does not improve for trial of antibiotic.   He is scheduled for restaging imaging and follow-up with Dr. Sherrod in March of this year. Radiation follow-up PRN. We appreciate the opportunity to take part in this patient's care. He was encouraged to call with any questions or concerns.    I personally spent 20 minutes in this encounter including chart review, reviewing radiological studies, meeting face-to-face with the patient, entering orders and completing documentation. ____________________________________    [name], PA-C   "

## 2024-05-03 ENCOUNTER — Other Ambulatory Visit (HOSPITAL_COMMUNITY): Payer: Self-pay

## 2024-05-07 ENCOUNTER — Encounter: Payer: Self-pay | Admitting: Radiology

## 2024-05-07 ENCOUNTER — Ambulatory Visit
Admission: RE | Admit: 2024-05-07 | Discharge: 2024-05-07 | Disposition: A | Source: Ambulatory Visit | Attending: Radiology | Admitting: Radiology

## 2024-05-07 ENCOUNTER — Other Ambulatory Visit: Payer: Self-pay

## 2024-05-07 ENCOUNTER — Other Ambulatory Visit: Payer: Self-pay | Admitting: Physician Assistant

## 2024-05-07 VITALS — BP 130/69 | HR 66 | Temp 97.5°F | Resp 20 | Ht 72.0 in | Wt 241.2 lb

## 2024-05-07 DIAGNOSIS — L270 Generalized skin eruption due to drugs and medicaments taken internally: Secondary | ICD-10-CM

## 2024-05-07 DIAGNOSIS — C3431 Malignant neoplasm of lower lobe, right bronchus or lung: Secondary | ICD-10-CM

## 2024-05-07 HISTORY — DX: Personal history of irradiation: Z92.3

## 2024-05-07 MED ORDER — DOXYCYCLINE HYCLATE 100 MG PO TABS: 100.0000 mg | ORAL_TABLET | Freq: Two times a day (BID) | ORAL | 0 refills | Status: AC

## 2024-05-07 MED ORDER — TRIAMCINOLONE ACETONIDE 0.1 % EX CREA: 1.0000 | TOPICAL_CREAM | Freq: Two times a day (BID) | CUTANEOUS | 0 refills | Status: AC

## 2024-05-07 NOTE — Progress Notes (Signed)
 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 He completed his radiation treatment on 04-03-2024.   Does the patient complain of any of the following: Pain:Denies Shortness of breath w/wo exertion: Denies Cough: Denies Hemoptysis: Denies Pain with swallowing: Denies Swallowing/choking concerns: Denies Appetite: Good Energy Level: Normal Post radiation skin Changes: Patient stated that he developed a rash to both arms and hands.    Additional comments if applicable: He is scheduled for restaging imaging and follow-up with Dr. Sherrod in March of this year. Radiation follow-up PRN. We appreciate the opportunity to take part in this patient's care. He was encouraged to call with any questions or concerns.   BP 130/69 (BP Location: Left Arm, Patient Position: Sitting, Cuff Size: Large)   Pulse 66   Temp (!) 97.5 F (36.4 C)   Resp 20   Ht 6' (1.829 m)   Wt 241 lb 3.2 oz (109.4 kg)   SpO2 99%   BMI 32.71 kg/m

## 2024-05-09 ENCOUNTER — Other Ambulatory Visit: Payer: Self-pay

## 2024-05-09 ENCOUNTER — Other Ambulatory Visit: Payer: Self-pay | Admitting: Pharmacy Technician

## 2024-05-09 ENCOUNTER — Other Ambulatory Visit (HOSPITAL_COMMUNITY): Payer: Self-pay

## 2024-05-09 NOTE — Progress Notes (Signed)
 Clinical Intervention Note  Clinical Intervention Notes: Patient reported starting a short course of Doxycycline , no DDIs were identified with his Tagrisso .   Clinical Intervention Outcomes: Prevention of an adverse drug event   [name] Blair Karel Santa

## 2024-05-09 NOTE — Progress Notes (Signed)
 Specialty Pharmacy Refill Coordination Note  [name] is a 79 y.o. male contacted today regarding refills of specialty medication(s) Osimertinib  Mesylate (TAGRISSO )   Patient requested Pickup at [facility] at [place] date: 05/14/24   Medication will be filled on: 05/13/24

## 2024-06-10 ENCOUNTER — Inpatient Hospital Stay

## 2024-06-11 ENCOUNTER — Other Ambulatory Visit (HOSPITAL_COMMUNITY)

## 2024-06-18 ENCOUNTER — Ambulatory Visit: Admitting: Thoracic Surgery (Cardiothoracic Vascular Surgery)

## 2024-06-20 ENCOUNTER — Inpatient Hospital Stay: Admitting: Internal Medicine

## 2024-08-05 ENCOUNTER — Ambulatory Visit: Admitting: Surgery
